# Patient Record
Sex: Female | Born: 1945
Health system: Southern US, Community
[De-identification: ages and names within clinical notes are randomized; demographics above are authoritative.]

## PROBLEM LIST (undated history)

## (undated) DIAGNOSIS — Z860100 Personal history of colon polyps, unspecified: Secondary | ICD-10-CM

## (undated) DIAGNOSIS — E119 Type 2 diabetes mellitus without complications: Secondary | ICD-10-CM

## (undated) DIAGNOSIS — M797 Fibromyalgia: Secondary | ICD-10-CM

## (undated) DIAGNOSIS — M549 Dorsalgia, unspecified: Secondary | ICD-10-CM

## (undated) DIAGNOSIS — M1990 Unspecified osteoarthritis, unspecified site: Secondary | ICD-10-CM

## (undated) DIAGNOSIS — K219 Gastro-esophageal reflux disease without esophagitis: Secondary | ICD-10-CM

## (undated) DIAGNOSIS — J302 Other seasonal allergic rhinitis: Secondary | ICD-10-CM

## (undated) DIAGNOSIS — G8929 Other chronic pain: Secondary | ICD-10-CM

## (undated) DIAGNOSIS — D259 Leiomyoma of uterus, unspecified: Secondary | ICD-10-CM

## (undated) DIAGNOSIS — I341 Nonrheumatic mitral (valve) prolapse: Secondary | ICD-10-CM

## (undated) DIAGNOSIS — F40240 Claustrophobia: Secondary | ICD-10-CM

## (undated) DIAGNOSIS — Z9889 Other specified postprocedural states: Secondary | ICD-10-CM

## (undated) DIAGNOSIS — K648 Other hemorrhoids: Secondary | ICD-10-CM

## (undated) DIAGNOSIS — H269 Unspecified cataract: Secondary | ICD-10-CM

## (undated) DIAGNOSIS — F419 Anxiety disorder, unspecified: Secondary | ICD-10-CM

## (undated) DIAGNOSIS — G2581 Restless legs syndrome: Secondary | ICD-10-CM

## (undated) DIAGNOSIS — M2550 Pain in unspecified joint: Secondary | ICD-10-CM

## (undated) DIAGNOSIS — G709 Myoneural disorder, unspecified: Secondary | ICD-10-CM

## (undated) DIAGNOSIS — Z8709 Personal history of other diseases of the respiratory system: Secondary | ICD-10-CM

## (undated) DIAGNOSIS — R351 Nocturia: Secondary | ICD-10-CM

## (undated) DIAGNOSIS — T7840XA Allergy, unspecified, initial encounter: Secondary | ICD-10-CM

## (undated) DIAGNOSIS — N189 Chronic kidney disease, unspecified: Secondary | ICD-10-CM

## (undated) DIAGNOSIS — I10 Essential (primary) hypertension: Secondary | ICD-10-CM

## (undated) DIAGNOSIS — J189 Pneumonia, unspecified organism: Secondary | ICD-10-CM

## (undated) DIAGNOSIS — R350 Frequency of micturition: Secondary | ICD-10-CM

## (undated) DIAGNOSIS — F32A Depression, unspecified: Secondary | ICD-10-CM

## (undated) DIAGNOSIS — K644 Residual hemorrhoidal skin tags: Secondary | ICD-10-CM

## (undated) DIAGNOSIS — R112 Nausea with vomiting, unspecified: Secondary | ICD-10-CM

## (undated) DIAGNOSIS — Z8619 Personal history of other infectious and parasitic diseases: Secondary | ICD-10-CM

## (undated) DIAGNOSIS — I499 Cardiac arrhythmia, unspecified: Secondary | ICD-10-CM

## (undated) HISTORY — PX: HEMORRHOID SURGERY: SHX153

## (undated) HISTORY — DX: Leiomyoma of uterus, unspecified: D25.9

## (undated) HISTORY — DX: Depression, unspecified: F32.A

## (undated) HISTORY — DX: Residual hemorrhoidal skin tags: K64.4

## (undated) HISTORY — DX: Type 2 diabetes mellitus without complications: E11.9

## (undated) HISTORY — DX: Personal history of colonic polyps: Z86.010

## (undated) HISTORY — DX: Essential (primary) hypertension: I10

## (undated) HISTORY — PX: TUBAL LIGATION: SHX77

## (undated) HISTORY — DX: Gastro-esophageal reflux disease without esophagitis: K21.9

## (undated) HISTORY — PX: WRIST SURGERY: SHX841

## (undated) HISTORY — DX: Unspecified cataract: H26.9

## (undated) HISTORY — DX: Allergy, unspecified, initial encounter: T78.40XA

## (undated) HISTORY — DX: Restless legs syndrome: G25.81

## (undated) HISTORY — DX: Unspecified osteoarthritis, unspecified site: M19.90

## (undated) HISTORY — DX: Personal history of colon polyps, unspecified: Z86.0100

## (undated) HISTORY — DX: Nonrheumatic mitral (valve) prolapse: I34.1

## (undated) HISTORY — PX: LUMBAR EPIDURAL INJECTION: SHX1980

## (undated) HISTORY — DX: Residual hemorrhoidal skin tags: K64.8

## (undated) HISTORY — PX: TONSILLECTOMY: SUR1361

## (undated) HISTORY — PX: BUNIONECTOMY: SHX129

## (undated) HISTORY — PX: COLONOSCOPY: SHX174

---

## 1998-02-17 ENCOUNTER — Other Ambulatory Visit: Admission: RE | Admit: 1998-02-17 | Discharge: 1998-02-17 | Payer: Self-pay | Admitting: Obstetrics and Gynecology

## 1999-08-04 ENCOUNTER — Encounter: Admission: RE | Admit: 1999-08-04 | Discharge: 1999-08-04 | Payer: Self-pay | Admitting: Obstetrics and Gynecology

## 1999-08-04 ENCOUNTER — Encounter: Payer: Self-pay | Admitting: Obstetrics and Gynecology

## 2000-04-20 ENCOUNTER — Encounter: Admission: RE | Admit: 2000-04-20 | Discharge: 2000-04-20 | Payer: Self-pay | Admitting: Family Medicine

## 2000-04-20 ENCOUNTER — Encounter: Payer: Self-pay | Admitting: Family Medicine

## 2000-08-23 ENCOUNTER — Encounter (INDEPENDENT_AMBULATORY_CARE_PROVIDER_SITE_OTHER): Payer: Self-pay | Admitting: Specialist

## 2000-08-23 ENCOUNTER — Ambulatory Visit (HOSPITAL_COMMUNITY): Admission: RE | Admit: 2000-08-23 | Discharge: 2000-08-23 | Payer: Self-pay | Admitting: Obstetrics and Gynecology

## 2000-08-23 HISTORY — PX: HYSTEROSCOPY WITH D & C: SHX1775

## 2000-11-07 ENCOUNTER — Encounter: Payer: Self-pay | Admitting: Obstetrics and Gynecology

## 2000-11-07 ENCOUNTER — Encounter: Admission: RE | Admit: 2000-11-07 | Discharge: 2000-11-07 | Payer: Self-pay | Admitting: Obstetrics and Gynecology

## 2001-04-24 ENCOUNTER — Encounter: Admission: RE | Admit: 2001-04-24 | Discharge: 2001-04-24 | Payer: Self-pay | Admitting: Orthopedic Surgery

## 2001-04-24 ENCOUNTER — Encounter: Payer: Self-pay | Admitting: Orthopedic Surgery

## 2001-06-14 ENCOUNTER — Ambulatory Visit (HOSPITAL_COMMUNITY): Admission: RE | Admit: 2001-06-14 | Discharge: 2001-06-14 | Payer: Self-pay | Admitting: Family Medicine

## 2001-10-05 ENCOUNTER — Other Ambulatory Visit: Admission: RE | Admit: 2001-10-05 | Discharge: 2001-10-05 | Payer: Self-pay | Admitting: Obstetrics and Gynecology

## 2002-07-08 ENCOUNTER — Other Ambulatory Visit: Admission: RE | Admit: 2002-07-08 | Discharge: 2002-07-08 | Payer: Self-pay | Admitting: Obstetrics and Gynecology

## 2002-12-20 ENCOUNTER — Encounter: Payer: Self-pay | Admitting: Neurosurgery

## 2002-12-20 ENCOUNTER — Ambulatory Visit (HOSPITAL_COMMUNITY): Admission: RE | Admit: 2002-12-20 | Discharge: 2002-12-20 | Payer: Self-pay | Admitting: Neurosurgery

## 2003-01-27 ENCOUNTER — Other Ambulatory Visit: Admission: RE | Admit: 2003-01-27 | Discharge: 2003-01-27 | Payer: Self-pay | Admitting: Obstetrics and Gynecology

## 2004-02-13 ENCOUNTER — Encounter: Admission: RE | Admit: 2004-02-13 | Discharge: 2004-02-13 | Payer: Self-pay | Admitting: Obstetrics and Gynecology

## 2004-06-18 ENCOUNTER — Ambulatory Visit: Payer: Self-pay | Admitting: Internal Medicine

## 2004-07-12 ENCOUNTER — Ambulatory Visit: Payer: Self-pay | Admitting: Internal Medicine

## 2004-07-12 HISTORY — PX: COLONOSCOPY: SHX5424

## 2005-03-11 ENCOUNTER — Emergency Department (HOSPITAL_COMMUNITY): Admission: EM | Admit: 2005-03-11 | Discharge: 2005-03-11 | Payer: Self-pay | Admitting: Emergency Medicine

## 2005-03-16 ENCOUNTER — Ambulatory Visit (HOSPITAL_COMMUNITY): Admission: RE | Admit: 2005-03-16 | Discharge: 2005-03-16 | Payer: Self-pay | Admitting: Neurosurgery

## 2005-03-16 HISTORY — PX: LUMBAR DISC SURGERY: SHX700

## 2005-05-26 ENCOUNTER — Ambulatory Visit (HOSPITAL_COMMUNITY): Admission: RE | Admit: 2005-05-26 | Discharge: 2005-05-26 | Payer: Self-pay | Admitting: Obstetrics and Gynecology

## 2005-08-24 ENCOUNTER — Encounter: Admission: RE | Admit: 2005-08-24 | Discharge: 2005-08-24 | Payer: Self-pay | Admitting: Neurosurgery

## 2006-05-29 ENCOUNTER — Ambulatory Visit (HOSPITAL_COMMUNITY): Admission: RE | Admit: 2006-05-29 | Discharge: 2006-05-29 | Payer: Self-pay | Admitting: Obstetrics and Gynecology

## 2007-05-31 ENCOUNTER — Ambulatory Visit (HOSPITAL_COMMUNITY): Admission: RE | Admit: 2007-05-31 | Discharge: 2007-05-31 | Payer: Self-pay | Admitting: Obstetrics and Gynecology

## 2008-06-02 ENCOUNTER — Ambulatory Visit (HOSPITAL_COMMUNITY): Admission: RE | Admit: 2008-06-02 | Discharge: 2008-06-02 | Payer: Self-pay | Admitting: Obstetrics and Gynecology

## 2008-06-13 ENCOUNTER — Encounter: Admission: RE | Admit: 2008-06-13 | Discharge: 2008-06-13 | Payer: Self-pay | Admitting: Obstetrics and Gynecology

## 2008-10-10 DIAGNOSIS — I341 Nonrheumatic mitral (valve) prolapse: Secondary | ICD-10-CM | POA: Insufficient documentation

## 2009-06-12 ENCOUNTER — Ambulatory Visit (HOSPITAL_COMMUNITY): Admission: RE | Admit: 2009-06-12 | Discharge: 2009-06-12 | Payer: Self-pay | Admitting: Obstetrics and Gynecology

## 2009-06-22 ENCOUNTER — Emergency Department (HOSPITAL_COMMUNITY): Admission: EM | Admit: 2009-06-22 | Discharge: 2009-06-22 | Payer: Self-pay | Admitting: Emergency Medicine

## 2009-06-29 ENCOUNTER — Encounter: Admission: RE | Admit: 2009-06-29 | Discharge: 2009-06-29 | Payer: Self-pay | Admitting: Family Medicine

## 2010-02-02 ENCOUNTER — Ambulatory Visit (HOSPITAL_BASED_OUTPATIENT_CLINIC_OR_DEPARTMENT_OTHER): Admission: RE | Admit: 2010-02-02 | Discharge: 2010-02-02 | Payer: Self-pay | Admitting: Orthopedic Surgery

## 2010-02-03 HISTORY — PX: PLANTAR FASCIA RELEASE: SHX2239

## 2010-06-14 ENCOUNTER — Ambulatory Visit (HOSPITAL_COMMUNITY): Admission: RE | Admit: 2010-06-14 | Discharge: 2010-06-14 | Payer: Self-pay | Admitting: Obstetrics and Gynecology

## 2010-08-19 DIAGNOSIS — J309 Allergic rhinitis, unspecified: Secondary | ICD-10-CM | POA: Insufficient documentation

## 2010-10-17 LAB — POCT I-STAT 4, (NA,K, GLUC, HGB,HCT)
HCT: 38 % (ref 36.0–46.0)
Hemoglobin: 12.9 g/dL (ref 12.0–15.0)

## 2010-11-03 LAB — COMPREHENSIVE METABOLIC PANEL
ALT: 25 U/L (ref 0–35)
AST: 26 U/L (ref 0–37)
Albumin: 3.9 g/dL (ref 3.5–5.2)
BUN: 20 mg/dL (ref 6–23)
Calcium: 9 mg/dL (ref 8.4–10.5)
Chloride: 102 mEq/L (ref 96–112)
Creatinine, Ser: 0.88 mg/dL (ref 0.4–1.2)
GFR calc Af Amer: >60 mL/min (ref >60)

## 2010-11-03 LAB — URINALYSIS, ROUTINE W REFLEX MICROSCOPIC
Hgb urine dipstick: NEGATIVE
Ketones, ur: NEGATIVE mg/dL
Nitrite: NEGATIVE

## 2010-11-03 LAB — URINE CULTURE: Colony Count: NO GROWTH

## 2010-11-03 LAB — URINE MICROSCOPIC-ADD ON

## 2010-11-03 LAB — DIFFERENTIAL
Eosinophils Absolute: 0.1 10*3/uL (ref 0.0–0.7)
Lymphocytes Relative: 30 % (ref 12–46)
Monocytes Absolute: 0.5 10*3/uL (ref 0.1–1.0)

## 2010-11-03 LAB — LIPASE, BLOOD: Lipase: 21 U/L (ref 11–59)

## 2010-11-03 LAB — CBC
HCT: 36.9 % (ref 36.0–46.0)
Platelets: 253 10*3/uL (ref 150–400)
RDW: 12.5 % (ref 11.5–15.5)

## 2010-11-08 DIAGNOSIS — I152 Hypertension secondary to endocrine disorders: Secondary | ICD-10-CM | POA: Insufficient documentation

## 2010-11-08 DIAGNOSIS — E1159 Type 2 diabetes mellitus with other circulatory complications: Secondary | ICD-10-CM | POA: Insufficient documentation

## 2010-12-17 NOTE — Op Note (Signed)
NAME:  [name], [name] NO.:  [id number]   MEDICAL RECORD NO.:  [id number]          PATIENT TYPE:  OIB   LOCATION:  2855                         FACILITY:  [facility]   PHYSICIAN:  Coletta Memos, M.D.     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 I have numbered it L4-5, thought there certainly could to be  some discrepancy as the last level maybe transitional and on plain films  there did not appear to be a true disk space, on MRI there is one.   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 I have numbered it L4-5, thought there certainly could to be  some discrepancy as the last level maybe transitional and on plain films  there did not appear to be a true disk space, on MRI there is one.   PROCEDURE:  Right L4- 5 semihemilaminectomy and diskectomy with  microdissection.   COMPLICATIONS:  None.   SURGEON:  Coletta Memos, M.D.   ASSISTANT:  Clydene Fake, M.D.   INDICATIONS:  Cooper Render is a 65 year old presenting with severe pain in the  right lower extremity which is getting worse over the last few weeks.  Epidural injections were of no help. I therefore recommended and she agreed  to undergo operative decompression.   OPERATIVE NOTE:  Mrs. [name] was brought to the operating room intubated and  placed under a general anesthetic without difficulty. She was rolled prone  onto a Wilson frame and all pressure points were properly padded. Back was  prepped and she was draped in sterile fashion and I had infiltrated 20 mL  0.5% lidocaine 1:200,000 strength epinephrine into the lumbar region. I  opened the skin with a #10 blade took this down to the thoracolumbar fascia.  I placed a double ended ganglion knife and this showed that I was at the  correct interlaminar space. Using that as a guide I then used a Kerrison and  removed some of the ligamentum flavum and then exposed the thecal sac,  taking off a small portion of the right L4 lamina on the right side. After  exposing the thecal sac, I was able retract that medially and encountered  what was a disk herniation overlying the disk space. I retracted the thecal  sac medially, brought the microscope in and with microdissection, removed  the disk material with Dr. Doreen Beam assistance. We fully decompressed the  nerve roots in the operative field. I inspected medially, laterally  rostrally and  caudally and felt that there were no free fragments, nor was any compression  left on the disk space. I then irrigated the wound. I then closed wound with  Dr. Doreen Beam assistance  in layered fashion. Vicryl sutures used to  reapproximate thoracolumbar fascia, subcutaneous tissue and then used  Dermabond for sterile dressing.           ______________________________  Coletta Memos, M.D.     KC/MEDQ  D:  03/16/2005  T:  03/16/2005  Job:  [id number]

## 2010-12-17 NOTE — Op Note (Signed)
[facility]  Patient:    [name], [name]                            MRN: [id number] Proc. Date: 08/23/00 Attending:  Lenoard Aden, M.D. [facility]   Operative Report  PREOPERATIVE DIAGNOSES:       1. Post menopausal bleeding.                               2. Questionable endometrial mass.                               3. Postoperative submucous fibroid.  PROCEDURES:                   1. Diagnostic hysteroscopy.                               2. Resectoscopic myomectomy.                               3. Dilation and curettage.  SURGEON:                      Lenoard Aden, M.D.  ANESTHESIA:                   General.  ESTIMATED BLOOD LOSS:         About 50 cc.  FLUID DEFICIT:                150 cc.  COMPLICATIONS:                None.  DRAINS:                       None.  SPECIMEN:                     Endometrial curettings and resection of questionable fibroid fragments to pathology.  COUNTS:                       Correct.  DISPOSITION:                  Patient to recovery in good condition.  DESCRIPTION OF PROCEDURE:     Being apprised of the risks of anesthesia, infection, bleeding, damage to abdominal organs with the need for repair and possible uterine perforation with injury to bladder or bladder with the need for repair, the patient was brought to the operating room, where she was administered general anesthesia without complications, prepped and draped in the usual sterile fashion, and catheterized until the bladder was empty. Examination under anesthesia revealed a bulky, anteflexed uterus and no adnexal masses.  At this time, diluted Pitressin solution was placed at 3 and 9 oclock, approximately 10 cc at the cervicovaginal junction.  A single tooth tenaculum was used to grasp the anterior lip of the cervix.  The uterus sounded to about 11 cm, dilated up to a #31 Pratt dilator and the diagnostic hysteroscope  was placed.  Visualization revealed a large, protrusive mass which was smooth walled and rubbery in its  consistency, projecting from the anterolateral and along the posterior wall on the patients right.  The right tubal ostium was easily visualized.  The left tubal ostium was not able to be seen.  Using a double loop right angle resectoscope, this mass was resected down to the level of the endometrium, taking approximately an hour to do so due to the large size of this mass.  Good hemostasis was achieved.  Fluid deficit was about 150 cc.  D&C revealed minimal tissue and curettings, which were sent with the specimen.  At the finish of this, there was a large, poorly ______ base of this mass along the posterolateral and anterior wall, seeing that the margins of this mass were note well defined along its attachment to the uterine wall.  The right tubal ostium was still unable to be visualized. However, the bulk of this mass was easily resected and sent to pathology for pathological confirmation.  At this time, the procedure was terminated.  The patient tolerated the procedure well and was transferred to recovery in good condition. DD:  08/23/00 TD:  08/23/00 Job: [id number]

## 2010-12-17 NOTE — H&P (Signed)
[facility] [place] [facility]  PatientBUFFEY, [name]                             MRN: [id number] Adm. Date:  08/23/00 Dictator:   Lenoard Aden, M.D. [facility]   History and Physical  PREOPERATIVE DIAGNOSIS:       Postmenopausal bleeding.  HISTORY OF PRESENT ILLNESS:   The patient is a 65 year old white female, G3, P3, with persistent bleeding on Prempro and questionable structural abnormality on ultrasound for definitive management.  PAST MEDICAL HISTORY:         Remarkable for mitral valve prolapse requiring no medications and hypertension.  MEDICATIONS:                  Prempro, HCTZ, Toprol and Prevacid.  ALLERGIES:                    She has allergies to CODEINE and TETRACYCLINE.  SOCIAL HISTORY:               She is a nonsmoker, nondrinker.  She denies domestic or physical violence.  FAMILY HISTORY:               Otherwise noncontributory.  OBSTETRIC HISTORY:            Remarkable for three uncomplicated vaginal deliveries.  PHYSICAL EXAMINATION:  GENERAL:                      She is a well-developed, well-nourished white female in no apparent distress.  HEENT:                        Normal.  LUNGS:                        Clear.  HEART:                        Regular rate and rhythm with a grade 1/6 to 2/6 ejection murmur.  ABDOMEN:                      Obese and nontender.  PELVIC EXAM:                  Reveals a boggy, anteflexed uterus and no adnexal masses.  RECTAL EXAM:                  Within normal limits.  EXTREMITIES:                  Revealed no cords.  NEUROLOGIC EXAM:              Nonfocal.  IMPRESSION:                   Postmenopausal bleeding with questionable structural lesion on ultrasound.  PLAN:                         Proceed with diagnostic hysteroscopy with possible rectoscope.  The risks of anesthesia, infection, bleeding, injury to abdominal organs with the need for repair is discussed.  The  risks of uterine perforation with possible injury to bowel and bladder are noted and the possible need for repair.  The patient is apprised of  these risks and she desires to proceed with the procedure. DD:  08/23/00 TD:  08/23/00 Job: [id number]

## 2011-05-25 ENCOUNTER — Encounter: Payer: Self-pay | Admitting: Internal Medicine

## 2011-05-25 ENCOUNTER — Ambulatory Visit (INDEPENDENT_AMBULATORY_CARE_PROVIDER_SITE_OTHER): Payer: 59 | Admitting: Internal Medicine

## 2011-05-25 VITALS — BP 132/68 | HR 80 | Ht 65.0 in | Wt 199.0 lb

## 2011-05-25 DIAGNOSIS — K648 Other hemorrhoids: Secondary | ICD-10-CM

## 2011-05-25 DIAGNOSIS — E669 Obesity, unspecified: Secondary | ICD-10-CM

## 2011-05-25 DIAGNOSIS — R198 Other specified symptoms and signs involving the digestive system and abdomen: Secondary | ICD-10-CM

## 2011-05-25 DIAGNOSIS — K219 Gastro-esophageal reflux disease without esophagitis: Secondary | ICD-10-CM

## 2011-05-25 DIAGNOSIS — R194 Change in bowel habit: Secondary | ICD-10-CM

## 2011-05-25 MED ORDER — OMEPRAZOLE MAGNESIUM 20 MG PO TBEC: 20.0000 mg | DELAYED_RELEASE_TABLET | Freq: Every day | ORAL | Status: DC

## 2011-05-25 MED ORDER — HYDROCORTISONE 2.5 % RE CREA: TOPICAL_CREAM | Freq: Two times a day (BID) | RECTAL | Status: AC

## 2011-05-25 NOTE — Assessment & Plan Note (Signed)
BMI is 33. She understands and accepts this. We discussed simple weight loss measures like smaller plates, reduced portions, not snacking or eating when there is no hunger. She has a goal to lose 10 pounds in 6 months.

## 2011-05-25 NOTE — Patient Instructions (Addendum)
If problems don't resolve and respond to the medication changes you should return to see Dr. Leone Payor. Your prescription(s) has(have) been sent to your pharmacy for you to pick up ([facility]ol-AC). Hemorrhoid handout given for you to read. Prevacid has been stopped and changed to Prilosec OTC.Marland Kitchen

## 2011-05-25 NOTE — Progress Notes (Signed)
  Subjective:    Patient ID: [name], female    DOB: September 30, 1945, 65 y.o.   MRN: [id number]  HPI This 65 year old woman known from prior colonoscopy in 2005, has complaints of painful hemorrhoids. She has noticed streaks of white red blood on the toilet paper when she returns from walking. There is also white red blood on toilet paper with defecation at times. She is also noticed in the past several months that she has more frequent and urgent bowel movements in the morning, and has had some episodes of fecal incontinence. Small amount into the underwear. She started taking amlodipine around this time but believe she actually had the bowel habit changes before the amlodipine was begun. She did start Prevacid about the same time, having switched from Prilosec.   She describes frequent regurgitation and heartburn if she does not take a PPI. She does not have any osteoporosis or osteopenia that we know of at this time. No dysphagia reported.  Past Medical History  Diagnosis Date  . HTN (hypertension)   . Mitral valve prolapse   . Uterine fibroid   . Diverticulosis   . Internal and external hemorrhoids without complication   . GERD (gastroesophageal reflux disease)   . Hx of colonic polyps 1975   Past Surgical History  Procedure Date  . Hysteroscopy w/d&c 08/23/2000    and resectoscopic myomectomy  . Lumbar disc surgery 03/16/2005  . Plantar fascia release 02/03/2010    and torn peroneus brevis tendon  . Hemorrhoid surgery   . Tonsillectomy   . Wrist surgery     left, rmoval of cyst  . Tubal ligation   . Colonoscopy 07/12/2004    diverticulosis, internal and external hemorrhoids    reports that she has quit smoking. She has never used smokeless tobacco. She reports that she does not drink alcohol or use illicit drugs. family history includes Pancreatic cancer in her father.  There is no history of Colon cancer. Allergies  Allergen Reactions  . Codeine   . Tetracyclines & Related          Review of Systems Planes of arthritis and back pain all of the root of systems negative    Objective:   Physical Exam General: Obese, Well-developed, well-nourished and in no acute distress Vitals: Reviewed and listed above Eyes:anicteric. Mouth and posterior pharynx: normal.  Neck: supple w/o thyromegaly or mass.  Lungs: clear. Heart: S1S2, no rubs, murmurs, gallops. Abdomen: soft, non-tender, no hepatosplenomegaly, hernia, or mass and BS+.  Rectal:  Female staff present - normal anoderm, resting tone. No mass. Soft brown stool. Nontender and no rectocele.   ANOSCOPY:  Swollen and erythematous internal hemorrhoids  Lymphatics: no cervical, [place]  nodes. Extremities:  no edema Skin no rash. Neuro: nonfocal. A&O x 3.  Psych: appropriate mood and  affect.        Assessment & Plan:

## 2011-05-25 NOTE — Assessment & Plan Note (Addendum)
It sounds like she does need a PPI to control things. I've asked her to lose 10 pounds in 6-12 months, more if possible. She drinks minimal caffeine. She will continue on daily PPI but will switched back to Prilosec since that did not cause side effects as suspected with Prevacid, i.e. the loose frequent stools. The concern about osteopenia or osteoporosis is appropriate legitimate, but it sounds like the risks of not treating her reflux outweigh the benefits. Should she lose weight she may require lower dose therapy. She has tried H2 blockers and they have not helped. About warning signs or other problems there is no reason to perform endoscopy.

## 2011-05-25 NOTE — Assessment & Plan Note (Signed)
The pattern of bleeding, the finding of hemorrhoids on anoscopy supports this diagnosis. I wonder if her more frequent loose stools are triggering this. Plan is to change back to Prilosec for acid reflux and she was not having frequent loose morning stools on this, treatment with ProctoCream-HC 2.5% to if this fails to resolve things she is to call back. Colonoscopy in 2005, routine screening colonoscopy not yet due until 2015.

## 2011-06-06 ENCOUNTER — Other Ambulatory Visit (HOSPITAL_COMMUNITY): Payer: Self-pay | Admitting: Obstetrics and Gynecology

## 2011-06-06 DIAGNOSIS — Z1231 Encounter for screening mammogram for malignant neoplasm of breast: Secondary | ICD-10-CM

## 2011-07-01 ENCOUNTER — Ambulatory Visit (HOSPITAL_COMMUNITY)
Admission: RE | Admit: 2011-07-01 | Discharge: 2011-07-01 | Disposition: A | Discharge status: Home / Self Care | Payer: Medicare Other | Source: Ambulatory Visit | Attending: Obstetrics and Gynecology | Admitting: Obstetrics and Gynecology

## 2011-07-01 DIAGNOSIS — Z1231 Encounter for screening mammogram for malignant neoplasm of breast: Secondary | ICD-10-CM | POA: Insufficient documentation

## 2012-04-23 ENCOUNTER — Other Ambulatory Visit: Payer: Self-pay | Admitting: Orthopedic Surgery

## 2012-04-23 DIAGNOSIS — M545 Low back pain, unspecified: Secondary | ICD-10-CM

## 2012-04-27 ENCOUNTER — Ambulatory Visit
Admission: RE | Admit: 2012-04-27 | Discharge: 2012-04-27 | Disposition: A | Discharge status: Home / Self Care | Payer: Medicare Other | Source: Ambulatory Visit | Attending: Orthopedic Surgery | Admitting: Orthopedic Surgery

## 2012-04-27 DIAGNOSIS — M545 Low back pain, unspecified: Secondary | ICD-10-CM

## 2012-04-27 MED ORDER — GADOBENATE DIMEGLUMINE 529 MG/ML IV SOLN: 19.0000 mL | Freq: Once | INTRAVENOUS | Status: AC | PRN

## 2012-06-13 ENCOUNTER — Other Ambulatory Visit (HOSPITAL_COMMUNITY): Payer: Self-pay | Admitting: Family Medicine

## 2012-06-13 DIAGNOSIS — Z1231 Encounter for screening mammogram for malignant neoplasm of breast: Secondary | ICD-10-CM

## 2012-07-05 ENCOUNTER — Ambulatory Visit (HOSPITAL_COMMUNITY): Payer: Medicare Other

## 2012-07-12 ENCOUNTER — Ambulatory Visit (HOSPITAL_COMMUNITY)
Admission: RE | Admit: 2012-07-12 | Discharge: 2012-07-12 | Disposition: A | Discharge status: Home / Self Care | Payer: Medicare Other | Source: Ambulatory Visit | Attending: Family Medicine | Admitting: Family Medicine

## 2012-07-12 DIAGNOSIS — Z1231 Encounter for screening mammogram for malignant neoplasm of breast: Secondary | ICD-10-CM | POA: Insufficient documentation

## 2012-09-12 DIAGNOSIS — M48061 Spinal stenosis, lumbar region without neurogenic claudication: Secondary | ICD-10-CM | POA: Insufficient documentation

## 2013-03-15 ENCOUNTER — Other Ambulatory Visit: Payer: Self-pay | Admitting: Family Medicine

## 2013-03-15 DIAGNOSIS — E2839 Other primary ovarian failure: Secondary | ICD-10-CM

## 2013-04-04 ENCOUNTER — Other Ambulatory Visit: Payer: Medicare Other

## 2013-04-09 DIAGNOSIS — E119 Type 2 diabetes mellitus without complications: Secondary | ICD-10-CM | POA: Insufficient documentation

## 2013-04-09 DIAGNOSIS — E1142 Type 2 diabetes mellitus with diabetic polyneuropathy: Secondary | ICD-10-CM | POA: Insufficient documentation

## 2013-04-11 ENCOUNTER — Ambulatory Visit
Admission: RE | Admit: 2013-04-11 | Discharge: 2013-04-11 | Disposition: A | Discharge status: Home / Self Care | Payer: Medicare Other | Source: Ambulatory Visit | Attending: Family Medicine | Admitting: Family Medicine

## 2013-04-11 DIAGNOSIS — E2839 Other primary ovarian failure: Secondary | ICD-10-CM

## 2013-04-15 ENCOUNTER — Other Ambulatory Visit: Payer: Medicare Other

## 2013-04-26 ENCOUNTER — Other Ambulatory Visit: Payer: Medicare Other

## 2013-05-01 ENCOUNTER — Ambulatory Visit
Admission: RE | Admit: 2013-05-01 | Discharge: 2013-05-01 | Disposition: A | Discharge status: Home / Self Care | Payer: Medicare Other | Source: Ambulatory Visit | Attending: Family Medicine | Admitting: Family Medicine

## 2013-06-10 ENCOUNTER — Other Ambulatory Visit (HOSPITAL_COMMUNITY): Payer: Self-pay | Admitting: Family Medicine

## 2013-06-10 DIAGNOSIS — Z1231 Encounter for screening mammogram for malignant neoplasm of breast: Secondary | ICD-10-CM

## 2013-07-15 ENCOUNTER — Ambulatory Visit (HOSPITAL_COMMUNITY)
Admission: RE | Admit: 2013-07-15 | Discharge: 2013-07-15 | Disposition: A | Discharge status: Home / Self Care | Payer: Medicare Other | Source: Ambulatory Visit | Attending: Family Medicine | Admitting: Family Medicine

## 2013-07-15 DIAGNOSIS — Z1231 Encounter for screening mammogram for malignant neoplasm of breast: Secondary | ICD-10-CM | POA: Insufficient documentation

## 2014-05-13 DIAGNOSIS — G2581 Restless legs syndrome: Secondary | ICD-10-CM | POA: Insufficient documentation

## 2014-06-06 ENCOUNTER — Encounter: Payer: Self-pay | Admitting: Internal Medicine

## 2014-06-19 ENCOUNTER — Other Ambulatory Visit (HOSPITAL_COMMUNITY): Payer: Self-pay | Admitting: Family Medicine

## 2014-06-19 DIAGNOSIS — Z1231 Encounter for screening mammogram for malignant neoplasm of breast: Secondary | ICD-10-CM

## 2014-07-14 ENCOUNTER — Encounter: Payer: Self-pay | Admitting: Internal Medicine

## 2014-07-16 ENCOUNTER — Ambulatory Visit (HOSPITAL_COMMUNITY)
Admission: RE | Admit: 2014-07-16 | Discharge: 2014-07-16 | Disposition: A | Discharge status: Home / Self Care | Payer: Medicare Other | Source: Ambulatory Visit | Attending: Family Medicine | Admitting: Family Medicine

## 2014-07-16 DIAGNOSIS — Z1231 Encounter for screening mammogram for malignant neoplasm of breast: Secondary | ICD-10-CM | POA: Diagnosis not present

## 2014-07-30 ENCOUNTER — Ambulatory Visit (AMBULATORY_SURGERY_CENTER): Payer: Self-pay | Admitting: *Deleted

## 2014-07-30 VITALS — Ht 65.0 in | Wt 199.0 lb

## 2014-07-30 DIAGNOSIS — Z1211 Encounter for screening for malignant neoplasm of colon: Secondary | ICD-10-CM

## 2014-07-30 NOTE — Progress Notes (Signed)
No egg or soy allergy. No anesthesia problems.  No home O2.  No diet meds.  

## 2014-08-14 ENCOUNTER — Encounter: Payer: Self-pay | Admitting: Internal Medicine

## 2014-08-14 ENCOUNTER — Ambulatory Visit (AMBULATORY_SURGERY_CENTER): Payer: Medicare Other | Admitting: Internal Medicine

## 2014-08-14 VITALS — BP 137/57 | HR 57 | Temp 96.4°F | Resp 19 | Ht 65.0 in | Wt 199.0 lb

## 2014-08-14 DIAGNOSIS — K5730 Diverticulosis of large intestine without perforation or abscess without bleeding: Secondary | ICD-10-CM

## 2014-08-14 DIAGNOSIS — Z1211 Encounter for screening for malignant neoplasm of colon: Secondary | ICD-10-CM

## 2014-08-14 LAB — GLUCOSE, CAPILLARY
GLUCOSE-CAPILLARY: 132 mg/dL — AB (ref 70–99)
Glucose-Capillary: 127 mg/dL — ABNORMAL HIGH (ref 70–99)

## 2014-08-14 MED ORDER — SODIUM CHLORIDE 0.9 % IV SOLN: 500.0000 mL | INTRAVENOUS | Status: DC

## 2014-08-14 NOTE — Progress Notes (Signed)
A/ox3 pleased with MAC, report to [facility]

## 2014-08-14 NOTE — Op Note (Signed)
[place]  [address] [place], [address]   COLONOSCOPY PROCEDURE REPORT  PATIENT: [name], [name]  MR#: [id number] BIRTHDATE: 1946/05/06 , 81  yrs. old GENDER: female ENDOSCOPIST: Gatha Mayer, MD, [facility] PROCEDURE DATE:  08/14/2014 PROCEDURE:   Colonoscopy, screening First Screening Colonoscopy - Avg.  risk and is 50 yrs.  old or older - No.  Prior Negative Screening - Now for repeat screening. 10 or more years since last screening  History of Adenoma - Now for follow-up colonoscopy & has been > or = to 3 yrs.  N/A  Polyps Removed Today? No.  Polyps Removed Today? No.  Recommend repeat exam, <10 yrs? Polyps Removed Today? No.  Recommend repeat exam, <10 yrs? No. ASA CLASS:   Class II INDICATIONS:average risk for colorectal cancer. MEDICATIONS: Propofol 200 mg IV and Monitored anesthesia care  DESCRIPTION OF PROCEDURE:   After the risks benefits and alternatives of the procedure were thoroughly explained, informed consent was obtained.  The digital rectal exam revealed no abnormalities of the rectum.   The [id number]  endoscope was introduced through the anus and advanced to the cecum, which was identified by both the appendix and ileocecal valve. No adverse events experienced.   The quality of the prep was excellent, using MiraLax  The instrument was then slowly withdrawn as the colon was fully examined.      COLON FINDINGS: 1) Sigmoid diverticulosis 2) Otherwise normal colonoscopy to cecum with excellent prep. Retroflexed views revealed no abnormalities. The time to cecum=5 minutes 23 seconds.  Withdrawal time=8 minutes 37 seconds.  The scope was withdrawn and the procedure completed. COMPLICATIONS: There were no immediate complications.  ENDOSCOPIC IMPRESSION: 1) Sigmoid diverticulosis 2) Otherwise normal colonoscopy to cecum with excellent prep  RECOMMENDATIONS: Follow-up GI as needed - may consider repeat screening in 10 yrs at 2 -  through PCP (no recall)  eSigned:  Gatha Mayer, MD, [facility] 08/14/2014 8:32 AM   cc: Billey Chang, MD and The Patient

## 2014-08-14 NOTE — Patient Instructions (Addendum)
No polyps or cancer seen. You do have a condition called diverticulosis - common and not usually a problem. Please read the handout provided.   You may consider repeating a colonoscopy or other screening in 10 years based upon your health - you would be 78 and that is in the range where it is not thought to be as beneficial to screen due to life expectancy.  I appreciate the opportunity to care for you. Gatha Mayer, MD, FACG  YOU HAD AN ENDOSCOPIC PROCEDURE TODAY AT [facility]: Refer to the procedure report that was given to you for any specific questions about what was found during the examination.  If the procedure report does not answer your questions, please call your gastroenterologist to clarify.  If you requested that your care partner not be given the details of your procedure findings, then the procedure report has been included in a sealed envelope for you to review at your convenience later.  YOU SHOULD EXPECT: Some feelings of bloating in the abdomen. Passage of more gas than usual.  Walking can help get rid of the air that was put into your GI tract during the procedure and reduce the bloating. If you had a lower endoscopy (such as a colonoscopy or flexible sigmoidoscopy) you may notice spotting of blood in your stool or on the toilet paper. If you underwent a bowel prep for your procedure, then you may not have a normal bowel movement for a few days.  DIET: Your first meal following the procedure should be a light meal and then it is ok to progress to your normal diet.  A half-sandwich or bowl of soup is an example of a good first meal.  Heavy or fried foods are harder to digest and may make you feel nauseous or bloated.  Likewise meals heavy in dairy and vegetables can cause extra gas to form and this can also increase the bloating.  Drink plenty of fluids but you should avoid alcoholic beverages for 24 hours.  ACTIVITY: Your care partner should take you  home directly after the procedure.  You should plan to take it easy, moving slowly for the rest of the day.  You can resume normal activity the day after the procedure however you should NOT DRIVE or use heavy machinery for 24 hours (because of the sedation medicines used during the test).    SYMPTOMS TO REPORT IMMEDIATELY: A gastroenterologist can be reached at any hour.  During normal business hours, 8:30 AM to 5:00 PM Monday through Friday, call [phone].  After hours and on weekends, please call the GI answering service at [phone] who will take a message and have the physician on call contact you.   Following lower endoscopy (colonoscopy or flexible sigmoidoscopy):  Excessive amounts of blood in the stool  Significant tenderness or worsening of abdominal pains  Swelling of the abdomen that is new, acute  Fever of 100F or higher  FOLLOW UP: If any biopsies were taken you will be contacted by phone or by letter within the next 1-3 weeks.  Call your gastroenterologist if you have not heard about the biopsies in 3 weeks.  Our staff will call the home number listed on your records the next business day following your procedure to check on you and address any questions or concerns that you may have at that time regarding the information given to you following your procedure. This is a courtesy call and so if there  is no answer at the home number and we have not heard from you through the emergency physician on call, we will assume that you have returned to your regular daily activities without incident.  SIGNATURES/CONFIDENTIALITY: You and/or your care partner have signed paperwork which will be entered into your electronic medical record.  These signatures attest to the fact that that the information above on your After Visit Summary has been reviewed and is understood.  Full responsibility of the confidentiality of this discharge information lies with you and/or your care-partner.

## 2014-08-15 ENCOUNTER — Telehealth: Payer: Self-pay

## 2014-08-15 NOTE — Telephone Encounter (Signed)
  Follow up Call-  Call back number 08/14/2014  Post procedure Call Back phone  # [phone]  Permission to leave phone message Yes     Patient questions:  Do you have a fever, pain , or abdominal swelling? No. Pain Score  0 *  Have you tolerated food without any problems? Yes.    Have you been able to return to your normal activities? Yes.    Do you have any questions about your discharge instructions: Diet   No. Medications  No. Follow up visit  No.  Do you have questions or concerns about your Care? No.  Actions: * If pain score is 4 or above: No action needed, pain <4.

## 2014-08-15 NOTE — Addendum Note (Signed)
Addended by: Steva Ready on: 08/15/2014 07:10 AM   Modules accepted: Level of Service

## 2015-06-04 DIAGNOSIS — M5136 Other intervertebral disc degeneration, lumbar region: Secondary | ICD-10-CM | POA: Insufficient documentation

## 2015-06-04 DIAGNOSIS — Z9889 Other specified postprocedural states: Secondary | ICD-10-CM | POA: Insufficient documentation

## 2015-06-09 ENCOUNTER — Other Ambulatory Visit: Payer: Self-pay

## 2015-06-09 DIAGNOSIS — Z1231 Encounter for screening mammogram for malignant neoplasm of breast: Secondary | ICD-10-CM

## 2015-07-21 ENCOUNTER — Ambulatory Visit
Admission: RE | Admit: 2015-07-21 | Discharge: 2015-07-21 | Disposition: A | Discharge status: Home / Self Care | Payer: Medicare Other | Source: Ambulatory Visit

## 2015-07-21 DIAGNOSIS — Z1231 Encounter for screening mammogram for malignant neoplasm of breast: Secondary | ICD-10-CM

## 2015-09-18 ENCOUNTER — Encounter (HOSPITAL_COMMUNITY): Payer: Self-pay

## 2015-09-18 NOTE — Pre-Procedure Instructions (Signed)
[name]  09/18/2015      [facility] - [place], [place] - [address] [place]  [address] Phone: [phone] Fax: [phone]    Your procedure is scheduled on Mon, Feb 27 @ 3:15 PM  Report to [facility] Admitting at [employer] PM  Call this number if you have problems the morning of surgery:  [phone]   Remember:  Do not eat food or drink liquids after midnight.  Take these medicines the morning of surgery with A SIP OF WATER Amlodipine(Norvasc),Eye Drops,Claritin(Loratadine),and Omeprazole(Prilosec)               Stop taking your Aspirin along with any Vitamins or Herbal Medications. No Goody's,BC's,Aleve,Advil,Motrin,or Fish Oil.    Do not wear jewelry, make-up or nail polish.  Do not wear lotions, powders, or perfumes.    Do not shave 48 hours prior to surgery.    Do not bring valuables to the hospital.  [facility] is not responsible for any belongings or valuables.  Contacts, dentures or bridgework may not be worn into surgery.  Leave your suitcase in the car.  After surgery it may be brought to your room.  For patients admitted to the hospital, discharge time will be determined by your treatment team.  Patients discharged the day of surgery will not be allowed to drive home.    Special instructions:  [name] - Preparing for Surgery  Before surgery, you can play an important role.  Because skin is not sterile, your skin needs to be as free of germs as possible.  You can reduce the number of germs on you skin by washing with CHG (chlorahexidine gluconate) soap before surgery.  CHG is an antiseptic cleaner which kills germs and bonds with the skin to continue killing germs even after washing.  Please DO NOT use if you have an allergy to CHG or antibacterial soaps.  If your skin becomes reddened/irritated stop using the CHG and inform your nurse when you arrive at Short Stay.  Do not shave (including legs and  underarms) for at least 48 hours prior to the first CHG shower.  You may shave your face.  Please follow these instructions carefully:   1.  Shower with CHG Soap the night before surgery and the                                morning of Surgery.  2.  If you choose to wash your hair, wash your hair first as usual with your       normal shampoo.  3.  After you shampoo, rinse your hair and body thoroughly to remove the                      Shampoo.  4.  Use CHG as you would any other liquid soap.  You can apply chg directly       to the skin and wash gently with scrungie or a clean washcloth.  5.  Apply the CHG Soap to your body ONLY FROM THE NECK DOWN.        Do not use on open wounds or open sores.  Avoid contact with your eyes,       ears, mouth and genitals (private parts).  Wash genitals (private parts)       with your normal soap.  6.  Wash thoroughly, paying special attention to the area where your surgery        will be performed.  7.  Thoroughly rinse your body with warm water from the neck down.  8.  DO NOT shower/wash with your normal soap after using and rinsing off       the CHG Soap.  9.  Pat yourself dry with a clean towel.            10.  Wear clean pajamas.            11.  Place clean sheets on your bed the night of your first shower and do not        sleep with pets.  Day of Surgery  Do not apply any lotions/deoderants the morning of surgery.  Please wear clean clothes to the hospital/surgery center.    Please read over the following fact sheets that you were given. Pain Booklet, Coughing and Deep Breathing and Surgical Site Infection Prevention

## 2015-09-21 ENCOUNTER — Encounter (HOSPITAL_COMMUNITY)
Admission: RE | Admit: 2015-09-21 | Discharge: 2015-09-21 | Disposition: A | Discharge status: Home / Self Care | Payer: Medicare Other | Source: Ambulatory Visit | Attending: Orthopedic Surgery | Admitting: Orthopedic Surgery

## 2015-09-21 ENCOUNTER — Encounter (HOSPITAL_COMMUNITY): Payer: Self-pay

## 2015-09-21 DIAGNOSIS — G2581 Restless legs syndrome: Secondary | ICD-10-CM | POA: Insufficient documentation

## 2015-09-21 DIAGNOSIS — K219 Gastro-esophageal reflux disease without esophagitis: Secondary | ICD-10-CM | POA: Diagnosis not present

## 2015-09-21 DIAGNOSIS — Z7984 Long term (current) use of oral hypoglycemic drugs: Secondary | ICD-10-CM | POA: Diagnosis not present

## 2015-09-21 DIAGNOSIS — I10 Essential (primary) hypertension: Secondary | ICD-10-CM | POA: Insufficient documentation

## 2015-09-21 DIAGNOSIS — Z79899 Other long term (current) drug therapy: Secondary | ICD-10-CM | POA: Diagnosis not present

## 2015-09-21 DIAGNOSIS — M19041 Primary osteoarthritis, right hand: Secondary | ICD-10-CM | POA: Insufficient documentation

## 2015-09-21 DIAGNOSIS — Z01818 Encounter for other preprocedural examination: Secondary | ICD-10-CM | POA: Diagnosis present

## 2015-09-21 DIAGNOSIS — I341 Nonrheumatic mitral (valve) prolapse: Secondary | ICD-10-CM | POA: Diagnosis not present

## 2015-09-21 DIAGNOSIS — E119 Type 2 diabetes mellitus without complications: Secondary | ICD-10-CM | POA: Diagnosis not present

## 2015-09-21 DIAGNOSIS — Z01812 Encounter for preprocedural laboratory examination: Secondary | ICD-10-CM | POA: Diagnosis not present

## 2015-09-21 DIAGNOSIS — Z7982 Long term (current) use of aspirin: Secondary | ICD-10-CM | POA: Insufficient documentation

## 2015-09-21 HISTORY — DX: Nocturia: R35.1

## 2015-09-21 HISTORY — DX: Personal history of other infectious and parasitic diseases: Z86.19

## 2015-09-21 HISTORY — DX: Other chronic pain: G89.29

## 2015-09-21 HISTORY — DX: Dorsalgia, unspecified: M54.9

## 2015-09-21 HISTORY — DX: Other specified postprocedural states: Z98.890

## 2015-09-21 HISTORY — DX: Pain in unspecified joint: M25.50

## 2015-09-21 HISTORY — DX: Other seasonal allergic rhinitis: J30.2

## 2015-09-21 HISTORY — DX: Nausea with vomiting, unspecified: R11.2

## 2015-09-21 HISTORY — DX: Personal history of other diseases of the respiratory system: Z87.09

## 2015-09-21 HISTORY — DX: Frequency of micturition: R35.0

## 2015-09-21 LAB — BASIC METABOLIC PANEL
Anion gap: 13 (ref 5–15)
BUN: 21 mg/dL — AB (ref 6–20)
CALCIUM: 9.8 mg/dL (ref 8.9–10.3)
CHLORIDE: 101 mmol/L (ref 101–111)
CO2: 25 mmol/L (ref 22–32)
CREATININE: 1.29 mg/dL — AB (ref 0.44–1.00)
GFR calc non Af Amer: 41 mL/min — ABNORMAL LOW (ref >60)
GFR, EST AFRICAN AMERICAN: 48 mL/min — AB (ref >60)
Glucose, Bld: 136 mg/dL — ABNORMAL HIGH (ref 65–99)
Potassium: 3.8 mmol/L (ref 3.5–5.1)
Sodium: 139 mmol/L (ref 135–145)

## 2015-09-21 LAB — CBC
HCT: 42.8 % (ref 36.0–46.0)
Hemoglobin: 13.8 g/dL (ref 12.0–15.0)
MCH: 29.2 pg (ref 26.0–34.0)
MCHC: 32.2 g/dL (ref 30.0–36.0)
MCV: 90.5 fL (ref 78.0–100.0)
PLATELETS: 291 10*3/uL (ref 150–400)
RBC: 4.73 MIL/uL (ref 3.87–5.11)
RDW: 13.6 % (ref 11.5–15.5)
WBC: 5.6 10*3/uL (ref 4.0–10.5)

## 2015-09-21 LAB — GLUCOSE, CAPILLARY: Glucose-Capillary: 168 mg/dL — ABNORMAL HIGH (ref 65–99)

## 2015-09-21 MED ORDER — CHLORHEXIDINE GLUCONATE 4 % EX LIQD: 60.0000 mL | Freq: Once | CUTANEOUS | Status: DC

## 2015-09-21 NOTE — Progress Notes (Addendum)
Cardiologist denies   Medical Md is Dr.Camille Clarise Cruz has had a couple but most recent 7-8 yrs ago  Stress test denies  Heart cath denies  EKG hasn't had one since around 2010  CXR under care everywhere 07-17-15

## 2015-09-22 LAB — HEMOGLOBIN A1C
HEMOGLOBIN A1C: 9 % — AB (ref 4.8–5.6)
Mean Plasma Glucose: 212 mg/dL

## 2015-09-23 NOTE — Progress Notes (Addendum)
Anesthesia Chart Review: Patient is a 70 year old female scheduled for right thumb trapezium excision with carpometacarpel arthroplasty and tendon transfer on 09/28/15 by Dr. Caralyn Guile.  History includes DM2, non-smoker, post-operative N/V, MVP, RLS, GERD, HTN, back surgery. BMI is consistent with mild obesity. PCP is Dr. Billey Chang. She was seen on 09/15/15 and was aware of surgery plans. Reported elevated glucose levels and two steroid injection for her hand pain. Dr. Jonni Sanger adjusted her DM meds and added Trulicity in hopes to get better DM control prior to surgery.  Meds include amlodipine, ASA, Trulicity, Jardiance, Claritin, metformin, Requip, Micardis HCT, Prilosec, magnesium.   09/21/15 EKG: NSR. Reported last echo > 7 years ago.   Preoperative labs noted. A1c 9.0. (A1c was 9.3 on 09/15/15 at Dr. Tamela Oddi office.). I left a message for patient to call as I wanted to discuss how home CBGs are running. However, there is some improvement just in the last few weeks following adjustment of her DM medications by her PCP. I called A1c result to Cecille Rubin at Dr. Angus Palms office. Patient will get a fasting CBG on arrival. If results are acceptable then I would anticipate that she could proceed as planned. (Update 09/24/15 1:50 PM: Patient reports home CBGs better since DM med adjustment. Fasting CBG this am was 118 and historically are running < 200. She denied any issues from her MVP. No SOB, CP, or significant palpitations. If no acute changes then I anticipate that she can proceed as planned.)  George Hugh [facility]/Anesthesiology Phone [phone] 09/23/2015 2:44 PM

## 2015-09-26 NOTE — H&P (Signed)
[name] is an 70 y.o. female.   Chief Complaint: RIGHT THUMB END STAGE ARTHRITIS HPI: PT WITH LONGSTANDING CMC ARTHRITIS, PT HAS FAILED NONSURGICAL TREATMENT PT HERE FOR SURGERY NO PRIOR SURGERY TO RIGHT THUMB   Past Medical History  Diagnosis Date  . Mitral valve prolapse   . Uterine fibroid   . Internal and external hemorrhoids without complication   . Hx of colonic polyps     benign  . Arthritis   . Restless leg syndrome     takes Requip at bedtime  . GERD (gastroesophageal reflux disease)     takes Omeprazole daily  . Seasonal allergies     takes Claritin daily as needed  . HTN (hypertension)     takes Amlodipine and Micardis daily  . History of bronchitis > 8 yrs ago  . Joint pain   . Chronic back pain     buldging disc,scoliosis,arthritis  . PONV (postoperative nausea and vomiting)     when gets injections in joints gets hives.Betadine rash  . Urinary frequency   . Nocturia   . Diabetes mellitus without complication ([place])     takes Trulicity,Jardiance,and Metformin daily.Average fasting blood sugar runs around130  . History of shingles     Past Surgical History  Procedure Laterality Date  . Hysteroscopy w/d&c  08/23/2000    and resectoscopic myomectomy  . Lumbar disc surgery  03/16/2005  . Plantar fascia release Left 02/03/2010    and torn tendon  . Hemorrhoid surgery      almost 40 yrs ago  . Tonsillectomy    . Wrist surgery      left, removal of cyst  . Tubal ligation    . Colonoscopy  07/12/2004    diverticulosis, internal and external hemorrhoids  . Bunionectomy Bilateral   . Colonoscopy    . Lumbar epidural injection      Family History  Problem Relation Age of Onset  . Colon cancer Neg Hx   . Pancreatic cancer Father    Social History:  reports that she has never smoked. She has never used smokeless tobacco. She reports that she does not drink alcohol or use illicit drugs.  Allergies:  Allergies  Allergen Reactions  . Sitagliptin Other  (See Comments)    Urinary hesitancy  . Ace Inhibitors     cough  . Betadine [Povidone Iodine]     rash  . Codeine Nausea And Vomiting  . Tetracyclines & Related Nausea And Vomiting    No prescriptions prior to admission    No results found for this or any previous visit (from the past 48 hour(s)). No results found.  ROSNO RECENT ILLNESSES OR HOSPITALIZATIONS  There were no vitals taken for this visit. Physical Exam   General Appearance:  Alert, cooperative, no distress, appears stated age  Head:  Normocephalic, without obvious abnormality, atraumatic  Eyes:  Pupils equal, conjunctiva/corneas clear,         Throat: Lips, mucosa, and tongue normal; teeth and gums normal  Neck: No visible masses     Lungs:   respirations unlabored  Chest Wall:  No tenderness or deformity  Heart:  Regular rate and rhythm,  Abdomen:   Soft, non-tender,         Extremities: RIGHT THUMB: SKIN INTACT FINGERS WARM WELL PERFUSED TENDER TO PALPATION OVER THUMB CMC JOINT +CMC CREPITUS NO MP HYPEREXTENSION GOOD THUMB IP MOTION GOOD WRIST AND DIGITAL MOBILITY  Pulses: 2+ and symmetric  Skin: Skin color, texture, turgor  normal, no rashes or lesions     Neurologic: Normal    Assessment/Plan RIGHT THUMB END STAGE CARPOMETACARPAL ARTHRITIS, BONE ON BONE ARTHRITIS  RIGHT THUMB CARPOMETACARPAL ARTHROPLASTY AND TENDON TRANSFER, SUSPENSIONPLASTY  R/B/A DISCUSSED WITH PT IN OFFICE.  PT VOICED UNDERSTANDING OF PLAN CONSENT SIGNED DAY OF SURGERY PT SEEN AND EXAMINED PRIOR TO OPERATIVE PROCEDURE/DAY OF SURGERY SITE MARKED. QUESTIONS ANSWERED WILL GO HOME FOLLOWING SURGERY  WE ARE PLANNING SURGERY FOR YOUR UPPER EXTREMITY. THE RISKS AND BENEFITS OF SURGERY INCLUDE BUT NOT LIMITED TO BLEEDING INFECTION, DAMAGE TO NEARBY NERVES ARTERIES TENDONS, FAILURE OF SURGERY TO ACCOMPLISH ITS INTENDED GOALS, PERSISTENT SYMPTOMS AND NEED FOR FURTHER SURGICAL INTERVENTION. WITH THIS IN MIND WE WILL PROCEED. I HAVE  DISCUSSED WITH THE PATIENT THE PRE AND POSTOPERATIVE REGIMEN AND THE DOS AND DON'TS. PT VOICED UNDERSTANDING AND INFORMED CONSENT SIGNED.  Linna Hoff 09/28/2015 @ 1432

## 2015-09-26 NOTE — Brief Op Note (Signed)
09/28/2015  9:47 AM  PATIENT:  [name]  70 y.o. female  PRE-OPERATIVE DIAGNOSIS:  right thumb end stage CMC osteoarthritis  POST-OPERATIVE DIAGNOSIS:  * No post-op diagnosis entered *  PROCEDURE:  Procedure(s): RIGHT THUMB TRAPEZIUM EXCISION WITH CARPOMETACARPEL (CMC) ARTHROPLASTY AND TENDON TRANSFER (Right) TENDON TRANSFER (Right)  SURGEON:  Surgeon(s) and Role:    * Iran Planas, MD - Primary  PHYSICIAN ASSISTANT: Chabon  PAC  ASSISTANTS: none   ANESTHESIA:   general  EBL:     BLOOD ADMINISTERED:none  DRAINS: none   LOCAL MEDICATIONS USED:  MARCAINE     SPECIMEN:  No Specimen  DISPOSITION OF SPECIMEN:  N/A  COUNTS:  YES  TOURNIQUET:  * No tourniquets in log *  DICTATION: .Other Dictation: Dictation Number [id number]  PLAN OF CARE: Discharge to home after PACU  PATIENT DISPOSITION:  PACU - hemodynamically stable.   Delay start of Pharmacological VTE agent (>24hrs) due to surgical blood loss or risk of bleeding: not applicable

## 2015-09-27 MED ORDER — CEFAZOLIN SODIUM-DEXTROSE 2-3 GM-% IV SOLR
2.0000 g | INTRAVENOUS | Status: AC
  Administered 2015-09-28: 2 g via INTRAVENOUS
  Filled 2015-09-27: qty 50

## 2015-09-28 ENCOUNTER — Encounter (HOSPITAL_COMMUNITY)
Admission: RE | Disposition: A | Discharge status: Home / Self Care | Payer: Self-pay | Source: Ambulatory Visit | Attending: Orthopedic Surgery

## 2015-09-28 ENCOUNTER — Ambulatory Visit (HOSPITAL_COMMUNITY): Payer: Medicare Other | Admitting: Certified Registered Nurse Anesthetist

## 2015-09-28 ENCOUNTER — Ambulatory Visit (HOSPITAL_COMMUNITY): Payer: Medicare Other | Admitting: Vascular Surgery

## 2015-09-28 ENCOUNTER — Encounter (HOSPITAL_COMMUNITY): Payer: Self-pay | Admitting: Surgery

## 2015-09-28 ENCOUNTER — Ambulatory Visit (HOSPITAL_COMMUNITY)
Admission: RE | Admit: 2015-09-28 | Discharge: 2015-09-28 | Disposition: A | Discharge status: Home / Self Care | Payer: Medicare Other | Source: Ambulatory Visit | Attending: Orthopedic Surgery | Admitting: Orthopedic Surgery

## 2015-09-28 DIAGNOSIS — I10 Essential (primary) hypertension: Secondary | ICD-10-CM | POA: Diagnosis not present

## 2015-09-28 DIAGNOSIS — M189 Osteoarthritis of first carpometacarpal joint, unspecified: Secondary | ICD-10-CM | POA: Diagnosis present

## 2015-09-28 DIAGNOSIS — E119 Type 2 diabetes mellitus without complications: Secondary | ICD-10-CM | POA: Diagnosis not present

## 2015-09-28 DIAGNOSIS — G2581 Restless legs syndrome: Secondary | ICD-10-CM | POA: Insufficient documentation

## 2015-09-28 DIAGNOSIS — G8929 Other chronic pain: Secondary | ICD-10-CM | POA: Diagnosis not present

## 2015-09-28 DIAGNOSIS — K219 Gastro-esophageal reflux disease without esophagitis: Secondary | ICD-10-CM | POA: Diagnosis not present

## 2015-09-28 DIAGNOSIS — Z7984 Long term (current) use of oral hypoglycemic drugs: Secondary | ICD-10-CM | POA: Diagnosis not present

## 2015-09-28 DIAGNOSIS — Z79899 Other long term (current) drug therapy: Secondary | ICD-10-CM | POA: Diagnosis not present

## 2015-09-28 DIAGNOSIS — Z7982 Long term (current) use of aspirin: Secondary | ICD-10-CM | POA: Diagnosis not present

## 2015-09-28 HISTORY — PX: TENDON TRANSFER: SHX6109

## 2015-09-28 HISTORY — PX: FINGER ARTHROSCOPY WITH CARPOMETACARPEL (CMC) ARTHROPLASTY: SHX5629

## 2015-09-28 LAB — GLUCOSE, CAPILLARY
GLUCOSE-CAPILLARY: 84 mg/dL (ref 65–99)
GLUCOSE-CAPILLARY: 78 mg/dL (ref 65–99)
Glucose-Capillary: 89 mg/dL (ref 65–99)

## 2015-09-28 SURGERY — FINGER ARTHROSCOPY WITH CARPOMETACARPEL (CMC) ARTHROPLASTY
Anesthesia: Regional | Site: Thumb | Laterality: Right

## 2015-09-28 MED ORDER — FENTANYL CITRATE (PF) 250 MCG/5ML IJ SOLN
INTRAMUSCULAR | Status: DC | PRN
  Administered 2015-09-28: 100 ug via INTRAVENOUS
  Administered 2015-09-28: 25 ug via INTRAVENOUS

## 2015-09-28 MED ORDER — ONDANSETRON HCL 4 MG/2ML IJ SOLN
INTRAMUSCULAR | Status: DC | PRN
  Administered 2015-09-28: 4 mg via INTRAVENOUS

## 2015-09-28 MED ORDER — BUPIVACAINE-EPINEPHRINE (PF) 0.5% -1:200000 IJ SOLN
INTRAMUSCULAR | Status: DC | PRN
  Administered 2015-09-28: 25 mL via PERINEURAL

## 2015-09-28 MED ORDER — PROPOFOL 10 MG/ML IV BOLUS
INTRAVENOUS | Status: DC | PRN
  Administered 2015-09-28: 150 mg via INTRAVENOUS
  Administered 2015-09-28: 20 mg via INTRAVENOUS

## 2015-09-28 MED ORDER — FENTANYL CITRATE (PF) 250 MCG/5ML IJ SOLN
INTRAMUSCULAR | Status: AC
  Filled 2015-09-28: qty 5

## 2015-09-28 MED ORDER — FENTANYL CITRATE (PF) 100 MCG/2ML IJ SOLN
100.0000 ug | Freq: Once | INTRAMUSCULAR | Status: AC
  Administered 2015-09-28: 100 ug via INTRAVENOUS

## 2015-09-28 MED ORDER — PHENYLEPHRINE HCL 10 MG/ML IJ SOLN
10.0000 mg | INTRAMUSCULAR | Status: DC | PRN
  Administered 2015-09-28: 40 ug/min via INTRAVENOUS

## 2015-09-28 MED ORDER — MIDAZOLAM HCL 2 MG/2ML IJ SOLN
INTRAMUSCULAR | Status: AC
Start: 2015-09-28 — End: 2015-09-28
  Administered 2015-09-28: 2 mg via INTRAVENOUS
  Filled 2015-09-28: qty 2

## 2015-09-28 MED ORDER — FENTANYL CITRATE (PF) 100 MCG/2ML IJ SOLN: 25.0000 ug | INTRAMUSCULAR | Status: DC | PRN

## 2015-09-28 MED ORDER — DEXAMETHASONE SODIUM PHOSPHATE 4 MG/ML IJ SOLN
INTRAMUSCULAR | Status: DC | PRN
  Administered 2015-09-28: 4 mg via INTRAVENOUS

## 2015-09-28 MED ORDER — PHENYLEPHRINE HCL 10 MG/ML IJ SOLN
INTRAMUSCULAR | Status: DC | PRN
  Administered 2015-09-28 (×6): 80 ug via INTRAVENOUS

## 2015-09-28 MED ORDER — MIDAZOLAM HCL 2 MG/2ML IJ SOLN
INTRAMUSCULAR | Status: AC
  Filled 2015-09-28: qty 2

## 2015-09-28 MED ORDER — LACTATED RINGERS IV SOLN
INTRAVENOUS | Status: DC
  Administered 2015-09-28 (×3): via INTRAVENOUS

## 2015-09-28 MED ORDER — FENTANYL CITRATE (PF) 100 MCG/2ML IJ SOLN
INTRAMUSCULAR | Status: AC
  Administered 2015-09-28: 100 ug via INTRAVENOUS
  Filled 2015-09-28: qty 2

## 2015-09-28 MED ORDER — DEXAMETHASONE SODIUM PHOSPHATE 4 MG/ML IJ SOLN
INTRAMUSCULAR | Status: AC
  Filled 2015-09-28: qty 2

## 2015-09-28 MED ORDER — HYDROCODONE-ACETAMINOPHEN 7.5-325 MG PO TABS: 1.0000 | ORAL_TABLET | Freq: Once | ORAL | Status: DC | PRN

## 2015-09-28 MED ORDER — PROPOFOL 10 MG/ML IV BOLUS
INTRAVENOUS | Status: AC
  Filled 2015-09-28: qty 20

## 2015-09-28 MED ORDER — DOCUSATE SODIUM 100 MG PO CAPS: 100.0000 mg | ORAL_CAPSULE | Freq: Two times a day (BID) | ORAL | Status: DC

## 2015-09-28 MED ORDER — METHOCARBAMOL 500 MG PO TABS: 500.0000 mg | ORAL_TABLET | Freq: Four times a day (QID) | ORAL | Status: DC

## 2015-09-28 MED ORDER — 0.9 % SODIUM CHLORIDE (POUR BTL) OPTIME
TOPICAL | Status: DC | PRN
  Administered 2015-09-28: 1000 mL

## 2015-09-28 MED ORDER — EPHEDRINE SULFATE 50 MG/ML IJ SOLN
INTRAMUSCULAR | Status: AC
  Filled 2015-09-28: qty 1

## 2015-09-28 MED ORDER — ONDANSETRON HCL 4 MG PO TABS: 4.0000 mg | ORAL_TABLET | Freq: Three times a day (TID) | ORAL | Status: DC | PRN

## 2015-09-28 MED ORDER — PROMETHAZINE HCL 25 MG/ML IJ SOLN: 6.2500 mg | INTRAMUSCULAR | Status: DC | PRN

## 2015-09-28 MED ORDER — LIDOCAINE HCL (CARDIAC) 20 MG/ML IV SOLN
INTRAVENOUS | Status: DC | PRN
  Administered 2015-09-28: 60 mg via INTRAVENOUS

## 2015-09-28 MED ORDER — OXYCODONE-ACETAMINOPHEN 5-325 MG PO TABS: 1.0000 | ORAL_TABLET | ORAL | Status: DC | PRN

## 2015-09-28 SURGICAL SUPPLY — 54 items
ANCHOR SUTBIO SWIVELK 3.5X15.8 (Anchor) ×2 IMPLANT
BANDAGE ELASTIC 3 VELCRO ST LF (GAUZE/BANDAGES/DRESSINGS) ×2 IMPLANT
BANDAGE ELASTIC 4 VELCRO ST LF (GAUZE/BANDAGES/DRESSINGS) ×2 IMPLANT
BLADE LONG MED 31X9 (MISCELLANEOUS) ×2 IMPLANT
BLADE SURG ROTATE 9660 (MISCELLANEOUS) IMPLANT
BNDG ESMARK 4X9 LF (GAUZE/BANDAGES/DRESSINGS) ×2 IMPLANT
BNDG GAUZE ELAST 4 BULKY (GAUZE/BANDAGES/DRESSINGS) ×2 IMPLANT
CORDS BIPOLAR (ELECTRODE) ×2 IMPLANT
COVER SURGICAL LIGHT HANDLE (MISCELLANEOUS) ×2 IMPLANT
CUFF TOURNIQUET SINGLE 18IN (TOURNIQUET CUFF) ×2 IMPLANT
CUFF TOURNIQUET SINGLE 24IN (TOURNIQUET CUFF) IMPLANT
DRAIN TLS ROUND 10FR (DRAIN) IMPLANT
DRAPE OEC MINIVIEW 54X84 (DRAPES) ×2 IMPLANT
DRAPE SURG 17X11 SM STRL (DRAPES) ×2 IMPLANT
DRSG ADAPTIC 3X8 NADH LF (GAUZE/BANDAGES/DRESSINGS) ×2 IMPLANT
ELECT REM PT RETURN 9FT ADLT (ELECTROSURGICAL)
ELECTRODE REM PT RTRN 9FT ADLT (ELECTROSURGICAL) IMPLANT
GAUZE SPONGE 4X4 12PLY STRL (GAUZE/BANDAGES/DRESSINGS) ×2 IMPLANT
GAUZE SPONGE 4X4 16PLY XRAY LF (GAUZE/BANDAGES/DRESSINGS) ×2 IMPLANT
GLOVE BIOGEL PI IND STRL 8.5 (GLOVE) ×1 IMPLANT
GLOVE BIOGEL PI INDICATOR 8.5 (GLOVE) ×1
GLOVE SURG ORTHO 8.0 STRL STRW (GLOVE) ×2 IMPLANT
GOWN STRL REUS W/ TWL LRG LVL3 (GOWN DISPOSABLE) ×3 IMPLANT
GOWN STRL REUS W/ TWL XL LVL3 (GOWN DISPOSABLE) ×1 IMPLANT
GOWN STRL REUS W/TWL LRG LVL3 (GOWN DISPOSABLE) ×3
GOWN STRL REUS W/TWL XL LVL3 (GOWN DISPOSABLE) ×1
KIT ASCP FXDISP 3X8XBTNDS (KITS) ×1 IMPLANT
KIT BASIN OR (CUSTOM PROCEDURE TRAY) ×2 IMPLANT
KIT BIO-TENODESIS 3X8 DISP (KITS) ×1
KIT ROOM TURNOVER OR (KITS) ×2 IMPLANT
MANIFOLD NEPTUNE II (INSTRUMENTS) ×2 IMPLANT
NEEDLE HYPO 25X1 1.5 SAFETY (NEEDLE) ×2 IMPLANT
NS IRRIG 1000ML POUR BTL (IV SOLUTION) ×2 IMPLANT
PACK ORTHO EXTREMITY (CUSTOM PROCEDURE TRAY) ×2 IMPLANT
PAD ARMBOARD 7.5X6 YLW CONV (MISCELLANEOUS) ×4 IMPLANT
PAD CAST 4YDX4 CTTN HI CHSV (CAST SUPPLIES) ×1 IMPLANT
PADDING CAST COTTON 4X4 STRL (CAST SUPPLIES) ×1
SCREW BIOCOM TENODESIS 3.8X8M (Screw) ×2 IMPLANT
SLING ARM FOAM STRAP MED (SOFTGOODS) ×2 IMPLANT
SOAP 2 % CHG 4 OZ (WOUND CARE) ×2 IMPLANT
SPONGE SURGIFOAM ABS GEL SZ50 (HEMOSTASIS) ×2 IMPLANT
STRIP CLOSURE SKIN 1/2X4 (GAUZE/BANDAGES/DRESSINGS) IMPLANT
SUT ETHILON 4 0 PS 2 18 (SUTURE) ×2 IMPLANT
SUT MNCRL AB 4-0 PS2 18 (SUTURE) ×4 IMPLANT
SUT VIC AB 2-0 FS1 27 (SUTURE) ×4 IMPLANT
SUT VICRYL 4-0 PS2 18IN ABS (SUTURE) IMPLANT
SYR CONTROL 10ML LL (SYRINGE) IMPLANT
SYSTEM CHEST DRAIN TLS 7FR (DRAIN) IMPLANT
TENODESIS KIT ×2 IMPLANT
TOWEL OR 17X24 6PK STRL BLUE (TOWEL DISPOSABLE) IMPLANT
TOWEL OR 17X26 10 PK STRL BLUE (TOWEL DISPOSABLE) ×2 IMPLANT
TUBE CONNECTING 12X1/4 (SUCTIONS) ×2 IMPLANT
WATER STERILE IRR 1000ML POUR (IV SOLUTION) IMPLANT
YANKAUER SUCT BULB TIP NO VENT (SUCTIONS) IMPLANT

## 2015-09-28 NOTE — Anesthesia Procedure Notes (Addendum)
Anesthesia Regional Block:  Axillary brachial plexus block  Pre-Anesthetic Checklist: ,, timeout performed, Correct Patient, Correct Site, Correct Laterality, Correct Procedure, Correct Position, site marked, Risks and benefits discussed,  Surgical consent,  Pre-op evaluation,  At surgeon's request and post-op pain management  Laterality: Right  Prep: chloraprep       Needles:  Injection technique: Single-shot  Needle Type: Echogenic Stimulator Needle     Needle Length: 10cm 10 cm Needle Gauge: 21 and 21 G    Additional Needles:  Procedures: ultrasound guided (picture in chart) and nerve stimulator Axillary brachial plexus block  Nerve Stimulator or Paresthesia:  Response: median, 0.5 mA,  Response: radial, 0.5 mA,   Additional Responses:   Narrative:  Start time: 09/28/2015 2:09 PM End time: 09/28/2015 2:19 PM Injection made incrementally with aspirations every 5 mL.  Performed by: Personally  Anesthesiologist: Suzette Battiest   Procedure Name: LMA Insertion Date/Time: 09/28/2015 3:31 PM Performed by: Ollen Bowl Pre-anesthesia Checklist: Patient identified, Emergency Drugs available, Suction available, Patient being monitored and Timeout performed Patient Re-evaluated:Patient Re-evaluated prior to inductionOxygen Delivery Method: Circle system utilized and Simple face mask Preoxygenation: Pre-oxygenation with 100% oxygen Intubation Type: IV induction Ventilation: Mask ventilation without difficulty LMA: LMA inserted LMA Size: 5.0 Number of attempts: 1 Airway Equipment and Method: Patient positioned with wedge pillow Placement Confirmation: positive ETCO2 and breath sounds checked- equal and bilateral Tube secured with: Tape Dental Injury: Teeth and Oropharynx as per pre-operative assessment

## 2015-09-28 NOTE — Transfer of Care (Signed)
Immediate Anesthesia Transfer of Care Note  Patient: [name]  Procedure(s) Performed: Procedure(s): RIGHT THUMB TRAPEZIUM EXCISION WITH CARPOMETACARPEL ([place]) ARTHROPLASTY AND TENDON TRANSFER (Right) TENDON TRANSFER (Right)  Patient Location: PACU  Anesthesia Type:GA combined with regional for post-op pain  Level of Consciousness: awake and alert   Airway & Oxygen Therapy: Patient Spontanous Breathing and Patient connected to nasal cannula oxygen  Post-op Assessment: Report given to RN and Post -op Vital signs reviewed and stable  Post vital signs: Reviewed and stable  Last Vitals:  Filed Vitals:   09/28/15 1423 09/28/15 1427  BP: 150/52 159/50  Pulse: 77 82  Temp:    Resp: 13 14    Complications: No apparent anesthesia complications

## 2015-09-28 NOTE — Anesthesia Preprocedure Evaluation (Signed)
Anesthesia Evaluation  Patient identified by MRN, date of birth, ID band Patient awake    Reviewed: Allergy & Precautions, NPO status , Patient's Chart, lab work & pertinent test results  History of Anesthesia Complications (+) PONV  Airway Mallampati: II  TM Distance: >3 FB Neck ROM: Full    Dental  (+) Dental Advisory Given   Pulmonary neg pulmonary ROS,    breath sounds clear to auscultation       Cardiovascular hypertension, Pt. on medications  Rhythm:Regular Rate:Normal     Neuro/Psych negative neurological ROS     GI/Hepatic Neg liver ROS, GERD  ,  Endo/Other  diabetes, Type 2, Oral Hypoglycemic Agents  Renal/GU      Musculoskeletal  (+) Arthritis ,   Abdominal   Peds  Hematology negative hematology ROS (+)   Anesthesia Other Findings   Reproductive/Obstetrics                             Lab Results  Component Value Date   WBC 5.6 09/21/2015   HGB 13.8 09/21/2015   HCT 42.8 09/21/2015   MCV 90.5 09/21/2015   PLT 291 09/21/2015   Lab Results  Component Value Date   CREATININE 1.29* 09/21/2015   BUN 21* 09/21/2015   NA 139 09/21/2015   K 3.8 09/21/2015   CL 101 09/21/2015   CO2 25 09/21/2015    Anesthesia Physical Anesthesia Plan  ASA: II  Anesthesia Plan: General and Regional   Post-op Pain Management: GA combined w/ Regional for post-op pain   Induction: Intravenous  Airway Management Planned: LMA  Additional Equipment:   Intra-op Plan:   Post-operative Plan: Extubation in OR  Informed Consent: I have reviewed the patients History and Physical, chart, labs and discussed the procedure including the risks, benefits and alternatives for the proposed anesthesia with the patient or authorized representative who has indicated his/her understanding and acceptance.   Dental advisory given  Plan Discussed with: CRNA  Anesthesia Plan Comments:          Anesthesia Quick Evaluation

## 2015-09-28 NOTE — Discharge Instructions (Signed)
KEEP BANDAGE CLEAN AND DRY °CALL OFFICE FOR F/U APPT [phone] in 2 weeks °DR Fermon Ureta CELL [phone] °KEEP HAND ELEVATED ABOVE HEART °OK TO APPLY ICE TO OPERATIVE AREA °CONTACT OFFICE IF ANY WORSENING PAIN OR CONCERNS. °

## 2015-09-28 NOTE — Anesthesia Postprocedure Evaluation (Signed)
Anesthesia Post Note  Patient: AHNESTI SABATELLI  Procedure(s) Performed: Procedure(s) (LRB): RIGHT THUMB TRAPEZIUM EXCISION WITH CARPOMETACARPEL ([place]) ARTHROPLASTY AND TENDON TRANSFER (Right) TENDON TRANSFER (Right)  Patient location during evaluation: PACU Anesthesia Type: General Level of consciousness: awake and alert Pain management: pain level controlled Vital Signs Assessment: post-procedure vital signs reviewed and stable Respiratory status: spontaneous breathing, nonlabored ventilation and respiratory function stable Cardiovascular status: blood pressure returned to baseline and stable Postop Assessment: no signs of nausea or vomiting Anesthetic complications: no    Last Vitals:  Filed Vitals:   09/28/15 1745 09/28/15 1754  BP: 140/71 144/68  Pulse: 76 78  Temp:  36.7 C  Resp: 10 16    Last Pain:  Filed Vitals:   09/28/15 1755  PainSc: Asleep                 Cozette Braggs A

## 2015-09-29 ENCOUNTER — Encounter (HOSPITAL_COMMUNITY): Payer: Self-pay | Admitting: Orthopedic Surgery

## 2015-09-29 NOTE — Op Note (Signed)
[name], [name]                ACCOUNT NO.:  [id number]  MEDICAL RECORD NO.:  [id number]  LOCATION:  [facility]                         FACILITY:  [facility]  PHYSICIAN:  Linna Hoff IV, M.D.DATE OF BIRTH:  01/01/1946  DATE OF PROCEDURE:  09/28/2015 DATE OF DISCHARGE:  09/28/2015                              OPERATIVE REPORT   PREOPERATIVE DIAGNOSIS:  Right thumb end-stage carpometacarpal arthritis, bone-on-bone arthritis.  POSTOPERATIVE DIAGNOSIS:  Right thumb end-stage carpometacarpal arthritis, bone-on-bone arthritis.  ATTENDING SURGEON:  Linna Hoff, M.D., who scrubbed and present for the entire procedure.  ASSISTANT SURGEON:  None.  ANESTHESIA:  General via LMA with regional block performed by Dr. Ilda Foil.  SURGICAL PROCEDURES: 1. Right thumb carpometacarpal arthroplasty and trapezium excision. 2. Right thumb abductor pollicis longus tendon transfer and     suspensionplasty, tendon transfer, dorsum of the hand. 3. Radiographs 3 views, right thumb.  SURGICAL IMPLANTS:  Arthrex 3.5 mm SwiveLock and 3.0 Bio-Tenodesis screw.  RADIOGRAPHIC INTERPRETATION:  AP and lateral and __________ views of the thumb did show the [facility] suspensionplasty in good position with excision of trapezium.  SURGICAL INDICATION:  [name] is a right-hand dominant female with end- stage bone-on-bone osteoarthritis.  The patient failed nonsurgical treatment.  Risks, benefits, and alternatives were discussed in detail with the patient and signed informed consent was obtained.  Risks include, but not limited to bleeding; infection; damage to nearby nerves, arteries, or tendons; loss of motion of wrist and digits; incomplete relief of symptoms; instability; and need for further surgical intervention.  DESCRIPTION OF PROCEDURE:  The patient was properly identified in the preoperative holding area, marked with a permanent marker made on the right thumb to indicate the correct operative site.  The  patient was brought back to the operating room, placed supine on the anesthesia room table.  General anesthesia was administered.  The patient tolerated this well.  A well-padded tourniquet was then placed on the right brachium, sealed with 1000 drape.  Right upper extremity was then prepped and draped in normal sterile fashion.  Time-out was called.  The correct site was identified, and the procedure was then begun.  Attention then turned to the right hand.  A longitudinal incision made directly over the thumb basilar joint.  The limb had been elevated and tourniquet insufflated.  Dissection was carried down through skin and subcutaneous tissue.  The interval between the extensor mechanism incised longitudinally.  Thick capsular flaps were then carried out, and a capsular release was then carried down, exposing the trapezium.  Careful protection of the radial artery was then done.  The STT joint was then identified and the trapezium was then carefully elevated and exposed.  Once exposure was then maintained and the trapezium was then excised.  Small osteotomes were used to break up the trapezium and the trapezium was removed in 3 large fragments.  This cleared up the joint nicely with careful protection of the FCR tendon, trying not to violate the FCR tendon.  Once this was carried out, the joint was then thoroughly irrigated.  Any loose bone fragments were then removed with a small rongeur.  The metacarpal was then prepared and the 3.5  mm drill bit was then drilled from a dorsal to volar direction, creating the tunnel for the tendon transfer.  The 3.0 mm drill bit was then used for the index metacarpal to create the suspensionplasty.  Once this was carried out, a separate incision made proximally, and the adductor pollicis longus tendon was then harvested.  The ulnar slip was then harvested, resected proximally and __________ wound distally.  This was split in half, creating a double  tendon transfer.  This was then passed beneath into the capsule, and the first tendon was then passed through the thumb metacarpal and back onto itself.  The SwiveLock screw was then placed to set the tension on the tendon.  This was also reinforced with the FiberWire suture and sewn back on itself.  The other tendon was then used for the tendon transfer to the index metacarpal.  The tendon was then prepared and then passed through the drill hole and then out the index metacarpal.  Tension was then appropriately set with the appropriate tension, making sure not to overtighten and cause impingement on the index metacarpal.  The tenodesis screw was then placed, setting and creating a suspensionplasty.  The wound was irrigated.  The remaining portion of the tendon was then anchovied, creating the rest of the arthroplasty with a FiberWire suture.  The capsule was then closed.  Thorough wound irrigation done.  Final radiographs were then obtained.  Gelfoam was then placed into the arthroplasty site.  Thick capsular closure was done with a 4.0 FiberWire suture.  Subcutaneous tissues closed with Monocryl, and skin was closed with running 4-0 Prolene.  Adaptic dressing and sterile compressive bandage were then applied.  The patient was placed in a well-padded thumb spica splint, extubated, and taken to recovery room in good condition.  POSTPROCEDURAL PLAN:  The patient discharged home, will be seen back in the office in approximately 2 weeks for wound check, suture removal, x- rays, application of short-arm thumb spica cast for a total of 6 weeks for immobilization, then begin outpatient therapy regimen.  The first visit will be for the postoperative visit, eval and treat for therapy, and begin the therapy at the 6-week mark.  Radiographs at each visit.     Melrose Nakayama, M.D.     FWO/MEDQ  D:  09/28/2015  T:  09/28/2015  Job:  [id number]

## 2015-09-29 NOTE — Op Note (Signed)
[name], [name]                ACCOUNT NO.:  [id number]  MEDICAL RECORD NO.:  [id number]  LOCATION:  [facility]                         FACILITY:  [facility]  PHYSICIAN:  Linna Hoff IV, M.D.DATE OF BIRTH:  Dec 23, 1945  DATE OF PROCEDURE:  09/28/2015 DATE OF DISCHARGE:  09/28/2015                              OPERATIVE REPORT   ADDENDUM:  SURGICAL ASSISTANT:  Molli Barrows, PA-C, who scrubbed and assisted for the entire procedure to help aid in trapezium excision, arthroplasty, and wound closure.     Melrose Nakayama, M.D.     FWO/MEDQ  D:  09/28/2015  T:  09/29/2015  Job:  [id number]

## 2015-10-06 ENCOUNTER — Encounter (HOSPITAL_COMMUNITY): Payer: Self-pay | Admitting: Orthopedic Surgery

## 2015-10-14 ENCOUNTER — Encounter (HOSPITAL_COMMUNITY): Payer: Self-pay | Admitting: Orthopedic Surgery

## 2015-10-21 ENCOUNTER — Encounter (HOSPITAL_COMMUNITY): Payer: Self-pay | Admitting: Orthopedic Surgery

## 2016-04-13 ENCOUNTER — Other Ambulatory Visit: Payer: Self-pay | Admitting: Family Medicine

## 2016-04-13 DIAGNOSIS — E2839 Other primary ovarian failure: Secondary | ICD-10-CM

## 2016-04-22 ENCOUNTER — Ambulatory Visit
Admission: RE | Admit: 2016-04-22 | Discharge: 2016-04-22 | Disposition: A | Discharge status: Home / Self Care | Payer: Medicare Other | Source: Ambulatory Visit | Attending: Family Medicine | Admitting: Family Medicine

## 2016-04-22 DIAGNOSIS — E2839 Other primary ovarian failure: Secondary | ICD-10-CM

## 2016-06-27 ENCOUNTER — Other Ambulatory Visit: Payer: Self-pay | Admitting: Family Medicine

## 2016-06-27 DIAGNOSIS — Z1231 Encounter for screening mammogram for malignant neoplasm of breast: Secondary | ICD-10-CM

## 2016-07-15 ENCOUNTER — Emergency Department (HOSPITAL_BASED_OUTPATIENT_CLINIC_OR_DEPARTMENT_OTHER)
Admission: EM | Admit: 2016-07-15 | Discharge: 2016-07-15 | Disposition: A | Discharge status: Home / Self Care | Payer: Medicare Other | Attending: Emergency Medicine | Admitting: Emergency Medicine

## 2016-07-15 ENCOUNTER — Encounter (HOSPITAL_BASED_OUTPATIENT_CLINIC_OR_DEPARTMENT_OTHER): Payer: Self-pay | Admitting: *Deleted

## 2016-07-15 DIAGNOSIS — Z79899 Other long term (current) drug therapy: Secondary | ICD-10-CM | POA: Insufficient documentation

## 2016-07-15 DIAGNOSIS — R21 Rash and other nonspecific skin eruption: Secondary | ICD-10-CM | POA: Diagnosis present

## 2016-07-15 DIAGNOSIS — E119 Type 2 diabetes mellitus without complications: Secondary | ICD-10-CM | POA: Insufficient documentation

## 2016-07-15 DIAGNOSIS — Z7982 Long term (current) use of aspirin: Secondary | ICD-10-CM | POA: Diagnosis not present

## 2016-07-15 DIAGNOSIS — I10 Essential (primary) hypertension: Secondary | ICD-10-CM | POA: Insufficient documentation

## 2016-07-15 MED ORDER — FAMOTIDINE 20 MG PO TABS
20.0000 mg | ORAL_TABLET | Freq: Once | ORAL | Status: AC
  Administered 2016-07-15: 20 mg via ORAL
  Filled 2016-07-15: qty 1

## 2016-07-15 MED ORDER — FAMOTIDINE 20 MG PO TABS: 20.0000 mg | ORAL_TABLET | Freq: Two times a day (BID) | ORAL | 0 refills | Status: DC

## 2016-07-15 MED ORDER — PREDNISONE 20 MG PO TABS: ORAL_TABLET | ORAL | 0 refills | Status: DC

## 2016-07-15 MED ORDER — METHYLPREDNISOLONE SODIUM SUCC 125 MG IJ SOLR
125.0000 mg | Freq: Once | INTRAMUSCULAR | Status: AC
  Administered 2016-07-15: 125 mg via INTRAMUSCULAR
  Filled 2016-07-15: qty 2

## 2016-07-15 MED FILL — predniSONE 20 MG TABS: 20 | 9 days supply | Qty: 12 | Fill #0

## 2016-07-15 MED FILL — FAMOTIDINE 20 MG TABLET: 20 | 15 days supply | Qty: 30 | Fill #0

## 2016-07-15 NOTE — ED Triage Notes (Signed)
Rash for over a week. Urticaria.

## 2016-07-15 NOTE — Discharge Instructions (Signed)
Start the prednisone tomorrow, you have already had today's dose.  Pepcid will help with the itching.  Continue benadryl as well.  Follow-up with your primary care doctor. Return to the ED for new or worsening symptoms, especially for any oral swelling or shortness of breath.

## 2016-07-15 NOTE — ED Provider Notes (Signed)
[facility] DEPT [facility] Provider Note   CSN: [id number] Arrival date & time: 07/15/16  1617  By signing my name below, I, [name], attest that this documentation has been prepared under the direction and in the presence of Quincy Carnes, PA-C. Electronically Signed: Judithann Sauger, ED Scribe. 07/15/16. 4:48 PM.   History   Chief Complaint Chief Complaint  Patient presents with  . Rash    HPI Comments: [name] is a 70 y.o. female with a hx of GERD, DM, seasonal allergies, and hypertension who presents to the Emergency Department complaining of gradually worsening moderately itchy red rash to her back, buttock, abdomen, bilateral legs onset one week ago. She notes that the rash began on her back and has since spread to other parts. She denies any recent changes in her medications, lotions, or detergents but she states that she was using bath crystals for the past few months but discontinued this after the rash appeared with change. No alleviating factors noted. No new medications or foods.  She states that she tried Benadryl and Zyrtec (last dose was 2 hours ago) PTA with no relief. She explains that she was evaluated by her PCP yesterday who prescribed her aTriamcinolone cream but her symptoms are still worsneing. Pt has an allergy to Sitagliptin, Ace inhibitors, Betadine, codeine, and tetracycline. She denies any fever, chills, shortness of breath, wheezing, vomiting, lip/tongue swelling, trouble swallowing, or any other symptoms.   The history is provided by the patient. No language interpreter was used.    Past Medical History:  Diagnosis Date  . Arthritis   . Chronic back pain    buldging disc,scoliosis,arthritis  . Diabetes mellitus without complication ([place])    takes Trulicity,Jardiance,and Metformin daily.Average fasting blood sugar runs around130  . GERD (gastroesophageal reflux disease)    takes Omeprazole daily  . History of bronchitis > 8 yrs ago  .  History of shingles   . HTN (hypertension)    takes Amlodipine and Micardis daily  . Hx of colonic polyps    benign  . Internal and external hemorrhoids without complication   . Joint pain   . Mitral valve prolapse   . Nocturia   . PONV (postoperative nausea and vomiting)    when gets injections in joints gets hives.Betadine rash  . Restless leg syndrome    takes Requip at bedtime  . Seasonal allergies    takes Claritin daily as needed  . Urinary frequency   . Uterine fibroid     Patient Active Problem List   Diagnosis Date Noted  . GERD (gastroesophageal reflux disease) 05/25/2011  . Hemorrhoids, internal, with bleeding 05/25/2011  . Obesity 05/25/2011    Past Surgical History:  Procedure Laterality Date  . BUNIONECTOMY Bilateral   . COLONOSCOPY  07/12/2004   diverticulosis, internal and external hemorrhoids  . COLONOSCOPY    . FINGER ARTHROSCOPY WITH CARPOMETACARPEL [facility]) ARTHROPLASTY Right 09/28/2015   Procedure: RIGHT THUMB TRAPEZIUM EXCISION WITH CARPOMETACARPEL ([place]) ARTHROPLASTY AND TENDON TRANSFER;  Surgeon: Iran Planas, MD;  Location: [facility];  Service: Orthopedics;  Laterality: Right;  . HEMORRHOID SURGERY     almost 40 yrs ago  . HYSTEROSCOPY W/D&C  08/23/2000   and resectoscopic myomectomy  . LUMBAR DISC SURGERY  03/16/2005  . LUMBAR EPIDURAL INJECTION    . PLANTAR FASCIA RELEASE Left 02/03/2010   and torn tendon  . TENDON TRANSFER Right 09/28/2015   Procedure: TENDON TRANSFER;  Surgeon: Iran Planas, MD;  Location: [facility];  Service: Orthopedics;  Laterality: Right;  . TONSILLECTOMY    . TUBAL LIGATION    . WRIST SURGERY     left, removal of cyst    OB History    No data available       Home Medications    Prior to Admission medications   Medication Sig Start Date End Date Taking? Authorizing Provider  GABAPENTIN PO Take by mouth.   Yes Historical Provider, MD  amLODipine (NORVASC) 5 MG tablet Take 5 mg by mouth daily.      Historical Provider, MD    aspirin 81 MG tablet Take 81 mg by mouth daily.    Historical Provider, MD  cholecalciferol (VITAMIN D) 1000 UNITS tablet Take 1,000 Units by mouth daily.      Historical Provider, MD  cycloSPORINE (RESTASIS) 0.05 % ophthalmic emulsion Place 1 drop into both eyes 2 (two) times daily.    Historical Provider, MD  docusate sodium (COLACE) 100 MG capsule Take 1 capsule (100 mg total) by mouth 2 (two) times daily. 09/28/15   Iran Planas, MD  Dulaglutide (TRULICITY) A999333 0000000 SOPN Inject 0.75 mg into the skin once a week.    Historical Provider, MD  empagliflozin (JARDIANCE) 10 MG TABS tablet Take 25 mg by mouth daily.  08/06/14   Historical Provider, MD  ibuprofen (ADVIL,MOTRIN) 800 MG tablet Take 800 mg by mouth 2 (two) times daily as needed for moderate pain.    Historical Provider, MD  loratadine (CLARITIN) 10 MG tablet Take 10 mg by mouth daily as needed for allergies.     Historical Provider, MD  Magnesium 250 MG TABS Take 1 tablet by mouth daily.    Historical Provider, MD  metFORMIN (GLUCOPHAGE-XR) 500 MG 24 hr tablet Take 1,000 mg by mouth daily with breakfast.    Historical Provider, MD  methocarbamol (ROBAXIN) 500 MG tablet Take 1 tablet (500 mg total) by mouth 4 (four) times daily. 09/28/15   Iran Planas, MD  Multiple Vitamin (MULTIVITAMIN) capsule Take 1 capsule by mouth daily.      Historical Provider, MD  omeprazole (PRILOSEC OTC) 20 MG tablet Take 1 tablet (20 mg total) by mouth daily. 05/25/11 08/14/14  Gatha Mayer, MD  omeprazole (PRILOSEC) 20 MG capsule 20 mg. 08/19/15   Historical Provider, MD  ondansetron (ZOFRAN) 4 MG tablet Take 1 tablet (4 mg total) by mouth every 8 (eight) hours as needed for nausea or vomiting. 09/28/15   Iran Planas, MD  oxyCODONE-acetaminophen (ROXICET) 5-325 MG tablet Take 1 tablet by mouth every 4 (four) hours as needed for severe pain. 09/28/15   Iran Planas, MD  rOPINIRole (REQUIP) 2 MG tablet Take 2 mg by mouth at bedtime.  06/26/15   Historical  Provider, MD  telmisartan-hydrochlorothiazide (MICARDIS HCT) 80-25 MG per tablet Take 1 tablet by mouth daily.      Historical Provider, MD    Family History Family History  Problem Relation Age of Onset  . Pancreatic cancer Father   . Colon cancer Neg Hx     Social History Social History  Substance Use Topics  . Smoking status: Never Smoker  . Smokeless tobacco: Never Used  . Alcohol use No     Allergies   Sitagliptin; Ace inhibitors; Betadine [povidone iodine]; Codeine; and Tetracyclines & related   Review of Systems Review of Systems  Constitutional: Negative for chills and fever.  HENT: Negative for facial swelling and trouble swallowing.   Respiratory: Negative for shortness of breath and wheezing.   Gastrointestinal: Negative  for vomiting.  Skin: Positive for rash.  All other systems reviewed and are negative.    Physical Exam Updated Vital Signs BP 154/67   Pulse 80   Temp 98.1 F (36.7 C) (Oral)   Resp 20   Ht 5\' 4"  (1.626 m)   Wt 190 lb (86.2 kg)   SpO2 99%   BMI 32.61 kg/m   Physical Exam  Constitutional: She is oriented to person, place, and time. She appears well-developed and well-nourished.  HENT:  Head: Normocephalic and atraumatic.  Mouth/Throat: Oropharynx is clear and moist.  No oral lesions, no oral swelling, normal phonation, no stridor, handling secretions well, airway patent  Eyes: Conjunctivae and EOM are normal. Pupils are equal, round, and reactive to light.  Neck: Normal range of motion.  Cardiovascular: Normal rate, regular rhythm and normal heart sounds.   Pulmonary/Chest: Effort normal and breath sounds normal.  Abdominal: Soft. Bowel sounds are normal.  Musculoskeletal: Normal range of motion.  Neurological: She is alert and oriented to person, place, and time.  Skin: Skin is warm and dry. Rash noted.  Fine maculopapular rash noted across trunk, back, thighs, and upper arms; pruritic with areas of excoriation noted; no  drainage; no signs of superimposed infection or cellulitis; no lesions on palms soles  Psychiatric: She has a normal mood and affect.  Nursing note and vitals reviewed.    ED Treatments / Results  DIAGNOSTIC STUDIES: Oxygen Saturation is 99% on RA, normal by my interpretation.    COORDINATION OF CARE: 4:44 PM- Pt advised of plan for treatment and pt agrees. Pt will receive Pepcid and solu-medrol.    Labs (all labs ordered are listed, but only abnormal results are displayed) Labs Reviewed - No data to display  EKG  EKG Interpretation None       Radiology No results found.  Procedures Procedures (including critical care time)  Medications Ordered in ED Medications - No data to display   Initial Impression / Assessment and Plan / ED Course  Quincy Carnes, PA-C has reviewed the triage vital signs and the nursing notes.  Pertinent labs & imaging results that were available during my care of the patient were reviewed by me and considered in my medical decision making (see chart for details).  Clinical Course    70 year old female here with fine maculopapular rash across her trunk, back, thighs, and upper arms. Rash is pruritic with areas of excoriation noted. There are no signs of superimposed infection or cellulitis. She is hemodynamically stable without any oral swelling or airway compromise. Uncertain etiology, question of bath crystals vs unknown.  Given that she is not had any improvement with topical steroids, will switch to systemic and add Pepcid. She will continue Benadryl at home. She'll follow-up with her primary care doctor for any ongoing issues.  Discussed plan with patient, she acknowledged understanding and agreed with plan of care.  Return precautions given for new or worsening symptoms.  Final Clinical Impressions(s) / ED Diagnoses   Final diagnoses:  Rash    New Prescriptions New Prescriptions   FAMOTIDINE (PEPCID) 20 MG TABLET    Take 1 tablet (20 mg  total) by mouth 2 (two) times daily.   PREDNISONE (DELTASONE) 20 MG TABLET    Take 40 mg by mouth daily for 3 days, then 20mg  by mouth daily for 3 days, then 10mg  daily for 3 days   I personally performed the services described in this documentation, which was scribed in my presence. The recorded  information has been reviewed and is accurate.   Larene Pickett, PA-C 07/15/16 1733    Virgel Manifold, MD 07/19/16 [phone]

## 2016-07-21 ENCOUNTER — Ambulatory Visit
Admission: RE | Admit: 2016-07-21 | Discharge: 2016-07-21 | Disposition: A | Discharge status: Home / Self Care | Payer: Medicare Other | Source: Ambulatory Visit | Attending: Family Medicine | Admitting: Family Medicine

## 2016-07-21 DIAGNOSIS — Z1231 Encounter for screening mammogram for malignant neoplasm of breast: Secondary | ICD-10-CM

## 2017-07-27 ENCOUNTER — Other Ambulatory Visit: Payer: Self-pay | Admitting: Family Medicine

## 2017-07-27 DIAGNOSIS — Z1231 Encounter for screening mammogram for malignant neoplasm of breast: Secondary | ICD-10-CM

## 2017-08-04 ENCOUNTER — Ambulatory Visit
Admission: RE | Admit: 2017-08-04 | Discharge: 2017-08-04 | Disposition: A | Discharge status: Home / Self Care | Payer: Medicare Other | Source: Ambulatory Visit | Attending: Family Medicine | Admitting: Family Medicine

## 2017-08-04 DIAGNOSIS — Z1231 Encounter for screening mammogram for malignant neoplasm of breast: Secondary | ICD-10-CM

## 2017-09-05 DIAGNOSIS — G8929 Other chronic pain: Secondary | ICD-10-CM | POA: Insufficient documentation

## 2017-09-05 DIAGNOSIS — M545 Low back pain, unspecified: Secondary | ICD-10-CM | POA: Insufficient documentation

## 2017-09-05 DIAGNOSIS — M5416 Radiculopathy, lumbar region: Secondary | ICD-10-CM | POA: Diagnosis not present

## 2017-09-05 DIAGNOSIS — T819XXA Unspecified complication of procedure, initial encounter: Secondary | ICD-10-CM | POA: Insufficient documentation

## 2017-09-05 DIAGNOSIS — M47816 Spondylosis without myelopathy or radiculopathy, lumbar region: Secondary | ICD-10-CM | POA: Diagnosis not present

## 2017-09-12 DIAGNOSIS — I10 Essential (primary) hypertension: Secondary | ICD-10-CM | POA: Diagnosis not present

## 2017-09-12 DIAGNOSIS — J328 Other chronic sinusitis: Secondary | ICD-10-CM | POA: Diagnosis not present

## 2017-09-14 DIAGNOSIS — M5416 Radiculopathy, lumbar region: Secondary | ICD-10-CM | POA: Diagnosis not present

## 2017-09-15 DIAGNOSIS — J0140 Acute pansinusitis, unspecified: Secondary | ICD-10-CM | POA: Diagnosis not present

## 2017-09-15 DIAGNOSIS — J324 Chronic pansinusitis: Secondary | ICD-10-CM | POA: Diagnosis not present

## 2017-10-04 DIAGNOSIS — J324 Chronic pansinusitis: Secondary | ICD-10-CM | POA: Diagnosis not present

## 2017-10-06 DIAGNOSIS — H04123 Dry eye syndrome of bilateral lacrimal glands: Secondary | ICD-10-CM | POA: Diagnosis not present

## 2017-10-06 DIAGNOSIS — H40033 Anatomical narrow angle, bilateral: Secondary | ICD-10-CM | POA: Diagnosis not present

## 2017-10-16 DIAGNOSIS — M25511 Pain in right shoulder: Secondary | ICD-10-CM | POA: Diagnosis not present

## 2017-10-16 DIAGNOSIS — M5412 Radiculopathy, cervical region: Secondary | ICD-10-CM | POA: Diagnosis not present

## 2017-10-16 DIAGNOSIS — M5416 Radiculopathy, lumbar region: Secondary | ICD-10-CM | POA: Diagnosis not present

## 2017-10-18 DIAGNOSIS — M5136 Other intervertebral disc degeneration, lumbar region: Secondary | ICD-10-CM | POA: Diagnosis not present

## 2017-10-24 DIAGNOSIS — M542 Cervicalgia: Secondary | ICD-10-CM | POA: Diagnosis not present

## 2017-10-26 DIAGNOSIS — M5416 Radiculopathy, lumbar region: Secondary | ICD-10-CM | POA: Diagnosis not present

## 2017-10-27 DIAGNOSIS — M542 Cervicalgia: Secondary | ICD-10-CM | POA: Diagnosis not present

## 2017-10-31 DIAGNOSIS — M542 Cervicalgia: Secondary | ICD-10-CM | POA: Diagnosis not present

## 2017-11-02 DIAGNOSIS — M5136 Other intervertebral disc degeneration, lumbar region: Secondary | ICD-10-CM | POA: Diagnosis not present

## 2017-11-03 DIAGNOSIS — M542 Cervicalgia: Secondary | ICD-10-CM | POA: Diagnosis not present

## 2017-11-25 DIAGNOSIS — M5416 Radiculopathy, lumbar region: Secondary | ICD-10-CM | POA: Diagnosis not present

## 2017-12-20 DIAGNOSIS — M9903 Segmental and somatic dysfunction of lumbar region: Secondary | ICD-10-CM | POA: Diagnosis not present

## 2017-12-20 DIAGNOSIS — M9905 Segmental and somatic dysfunction of pelvic region: Secondary | ICD-10-CM | POA: Diagnosis not present

## 2017-12-20 DIAGNOSIS — M9904 Segmental and somatic dysfunction of sacral region: Secondary | ICD-10-CM | POA: Diagnosis not present

## 2017-12-20 DIAGNOSIS — M5431 Sciatica, right side: Secondary | ICD-10-CM | POA: Diagnosis not present

## 2017-12-21 DIAGNOSIS — M9904 Segmental and somatic dysfunction of sacral region: Secondary | ICD-10-CM | POA: Diagnosis not present

## 2017-12-21 DIAGNOSIS — M9903 Segmental and somatic dysfunction of lumbar region: Secondary | ICD-10-CM | POA: Diagnosis not present

## 2017-12-21 DIAGNOSIS — M5431 Sciatica, right side: Secondary | ICD-10-CM | POA: Diagnosis not present

## 2017-12-21 DIAGNOSIS — M9905 Segmental and somatic dysfunction of pelvic region: Secondary | ICD-10-CM | POA: Diagnosis not present

## 2017-12-26 DIAGNOSIS — M5431 Sciatica, right side: Secondary | ICD-10-CM | POA: Diagnosis not present

## 2017-12-26 DIAGNOSIS — M9903 Segmental and somatic dysfunction of lumbar region: Secondary | ICD-10-CM | POA: Diagnosis not present

## 2017-12-26 DIAGNOSIS — M9905 Segmental and somatic dysfunction of pelvic region: Secondary | ICD-10-CM | POA: Diagnosis not present

## 2017-12-26 DIAGNOSIS — M9904 Segmental and somatic dysfunction of sacral region: Secondary | ICD-10-CM | POA: Diagnosis not present

## 2017-12-27 DIAGNOSIS — M9903 Segmental and somatic dysfunction of lumbar region: Secondary | ICD-10-CM | POA: Diagnosis not present

## 2017-12-27 DIAGNOSIS — M5431 Sciatica, right side: Secondary | ICD-10-CM | POA: Diagnosis not present

## 2017-12-27 DIAGNOSIS — M9905 Segmental and somatic dysfunction of pelvic region: Secondary | ICD-10-CM | POA: Diagnosis not present

## 2017-12-27 DIAGNOSIS — M9904 Segmental and somatic dysfunction of sacral region: Secondary | ICD-10-CM | POA: Diagnosis not present

## 2017-12-28 DIAGNOSIS — M9903 Segmental and somatic dysfunction of lumbar region: Secondary | ICD-10-CM | POA: Diagnosis not present

## 2017-12-28 DIAGNOSIS — M9905 Segmental and somatic dysfunction of pelvic region: Secondary | ICD-10-CM | POA: Diagnosis not present

## 2017-12-28 DIAGNOSIS — M9904 Segmental and somatic dysfunction of sacral region: Secondary | ICD-10-CM | POA: Diagnosis not present

## 2017-12-28 DIAGNOSIS — M5431 Sciatica, right side: Secondary | ICD-10-CM | POA: Diagnosis not present

## 2018-01-01 DIAGNOSIS — M9904 Segmental and somatic dysfunction of sacral region: Secondary | ICD-10-CM | POA: Diagnosis not present

## 2018-01-01 DIAGNOSIS — M5431 Sciatica, right side: Secondary | ICD-10-CM | POA: Diagnosis not present

## 2018-01-01 DIAGNOSIS — M9903 Segmental and somatic dysfunction of lumbar region: Secondary | ICD-10-CM | POA: Diagnosis not present

## 2018-01-01 DIAGNOSIS — M9905 Segmental and somatic dysfunction of pelvic region: Secondary | ICD-10-CM | POA: Diagnosis not present

## 2018-01-03 DIAGNOSIS — M9905 Segmental and somatic dysfunction of pelvic region: Secondary | ICD-10-CM | POA: Diagnosis not present

## 2018-01-03 DIAGNOSIS — M5431 Sciatica, right side: Secondary | ICD-10-CM | POA: Diagnosis not present

## 2018-01-03 DIAGNOSIS — M9904 Segmental and somatic dysfunction of sacral region: Secondary | ICD-10-CM | POA: Diagnosis not present

## 2018-01-03 DIAGNOSIS — M9903 Segmental and somatic dysfunction of lumbar region: Secondary | ICD-10-CM | POA: Diagnosis not present

## 2018-01-08 DIAGNOSIS — M9905 Segmental and somatic dysfunction of pelvic region: Secondary | ICD-10-CM | POA: Diagnosis not present

## 2018-01-08 DIAGNOSIS — M9904 Segmental and somatic dysfunction of sacral region: Secondary | ICD-10-CM | POA: Diagnosis not present

## 2018-01-08 DIAGNOSIS — M9903 Segmental and somatic dysfunction of lumbar region: Secondary | ICD-10-CM | POA: Diagnosis not present

## 2018-01-08 DIAGNOSIS — M5431 Sciatica, right side: Secondary | ICD-10-CM | POA: Diagnosis not present

## 2018-01-17 DIAGNOSIS — E119 Type 2 diabetes mellitus without complications: Secondary | ICD-10-CM | POA: Diagnosis not present

## 2018-01-17 DIAGNOSIS — E1159 Type 2 diabetes mellitus with other circulatory complications: Secondary | ICD-10-CM | POA: Diagnosis not present

## 2018-01-17 DIAGNOSIS — J0100 Acute maxillary sinusitis, unspecified: Secondary | ICD-10-CM | POA: Diagnosis not present

## 2018-01-17 DIAGNOSIS — I10 Essential (primary) hypertension: Secondary | ICD-10-CM | POA: Diagnosis not present

## 2018-04-19 DIAGNOSIS — I10 Essential (primary) hypertension: Secondary | ICD-10-CM | POA: Diagnosis not present

## 2018-04-19 DIAGNOSIS — E119 Type 2 diabetes mellitus without complications: Secondary | ICD-10-CM | POA: Diagnosis not present

## 2018-04-19 DIAGNOSIS — Z0000 Encounter for general adult medical examination without abnormal findings: Secondary | ICD-10-CM | POA: Diagnosis not present

## 2018-04-19 DIAGNOSIS — Z23 Encounter for immunization: Secondary | ICD-10-CM | POA: Diagnosis not present

## 2018-04-19 DIAGNOSIS — E1159 Type 2 diabetes mellitus with other circulatory complications: Secondary | ICD-10-CM | POA: Diagnosis not present

## 2018-04-30 DIAGNOSIS — I872 Venous insufficiency (chronic) (peripheral): Secondary | ICD-10-CM | POA: Diagnosis not present

## 2018-07-04 ENCOUNTER — Other Ambulatory Visit: Payer: Self-pay

## 2018-07-04 ENCOUNTER — Emergency Department (HOSPITAL_COMMUNITY): Payer: Medicare Other

## 2018-07-04 ENCOUNTER — Emergency Department (HOSPITAL_COMMUNITY)
Admission: EM | Admit: 2018-07-04 | Discharge: 2018-07-04 | Discharge status: Against Medical Advice | Payer: Medicare Other | Attending: Emergency Medicine | Admitting: Emergency Medicine

## 2018-07-04 ENCOUNTER — Encounter (HOSPITAL_COMMUNITY): Payer: Self-pay

## 2018-07-04 DIAGNOSIS — Z5320 Procedure and treatment not carried out because of patient's decision for unspecified reasons: Secondary | ICD-10-CM | POA: Insufficient documentation

## 2018-07-04 DIAGNOSIS — E119 Type 2 diabetes mellitus without complications: Secondary | ICD-10-CM | POA: Insufficient documentation

## 2018-07-04 DIAGNOSIS — I160 Hypertensive urgency: Secondary | ICD-10-CM

## 2018-07-04 DIAGNOSIS — R0789 Other chest pain: Secondary | ICD-10-CM | POA: Insufficient documentation

## 2018-07-04 DIAGNOSIS — Z7984 Long term (current) use of oral hypoglycemic drugs: Secondary | ICD-10-CM | POA: Diagnosis not present

## 2018-07-04 DIAGNOSIS — I10 Essential (primary) hypertension: Secondary | ICD-10-CM | POA: Diagnosis not present

## 2018-07-04 DIAGNOSIS — Z79899 Other long term (current) drug therapy: Secondary | ICD-10-CM | POA: Diagnosis not present

## 2018-07-04 DIAGNOSIS — R079 Chest pain, unspecified: Secondary | ICD-10-CM | POA: Diagnosis not present

## 2018-07-04 LAB — CBC
HCT: 45.4 % (ref 36.0–46.0)
Hemoglobin: 14.3 g/dL (ref 12.0–15.0)
MCH: 28.6 pg (ref 26.0–34.0)
MCHC: 31.5 g/dL (ref 30.0–36.0)
MCV: 90.8 fL (ref 80.0–100.0)
Platelets: 265 10*3/uL (ref 150–400)
RBC: 5 MIL/uL (ref 3.87–5.11)
RDW: 13.5 % (ref 11.5–15.5)
WBC: 8 10*3/uL (ref 4.0–10.5)
nRBC: 0 % (ref 0.0–0.2)

## 2018-07-04 LAB — BASIC METABOLIC PANEL
Anion gap: 9 (ref 5–15)
BUN: 22 mg/dL (ref 8–23)
CO2: 25 mmol/L (ref 22–32)
Calcium: 9.8 mg/dL (ref 8.9–10.3)
Chloride: 107 mmol/L (ref 98–111)
Creatinine, Ser: 1.13 mg/dL — ABNORMAL HIGH (ref 0.44–1.00)
GFR calc Af Amer: 56 mL/min — ABNORMAL LOW (ref >60)
GFR calc non Af Amer: 49 mL/min — ABNORMAL LOW (ref >60)
Glucose, Bld: 105 mg/dL — ABNORMAL HIGH (ref 70–99)
Potassium: 3.7 mmol/L (ref 3.5–5.1)
Sodium: 141 mmol/L (ref 135–145)

## 2018-07-04 LAB — I-STAT CG4 LACTIC ACID, ED: Lactic Acid, Venous: 1.43 mmol/L (ref 0.5–1.9)

## 2018-07-04 NOTE — ED Triage Notes (Addendum)
Pt. Took her BP today and it was elevated.  She is having chest tightness and Headache.  Symptoms began today.  Skin is p/w/d,   Chest tightness is non-radiating.  Pt. Denies any n/v   ECG completed in triage.   Pt. s BP medications have been changed in the last month. She also reports having swelling to her lower legs.  Wears compression stockings.  BP 230/100 Lt. Arm and Rt. Arm 218/98 at home

## 2018-07-04 NOTE — ED Provider Notes (Signed)
[facility] Provider Note   CSN: [id number] Arrival date & time: 07/04/18  1802     History   Chief Complaint Chief Complaint  Patient presents with  . Chest Pain  . Hypertension    HPI [name] is a 72 y.o. female.  HPI  72 year old female comes in with chief complaint of elevated blood pressure. Patient has history of diabetes, hypertension.  Patient reports that she was checking her husband's blood pressure and decided to check her blood pressure as well to cross verify accuracy.  Initially the blood pressure machine was giving an erroneous results.  She called her granddaughter who is a paramedic and she checked her blood pressure and noted that it was over 712 systolic.  She advised patient to come to the ER.  Patient denies any associated chest pain, shortness of breath, new headache, focal numbness-weakness.  She reports that she has had a headache off and on for the last 2 to 3 weeks.   Patient has been taking her blood pressure medicines as prescribed.  She reports that her amlodipine was discontinued few weeks back after she started having leg swelling.  Past Medical History:  Diagnosis Date  . Arthritis   . Chronic back pain    buldging disc,scoliosis,arthritis  . Diabetes mellitus without complication ([place])    takes Trulicity,Jardiance,and Metformin daily.Average fasting blood sugar runs around130  . GERD (gastroesophageal reflux disease)    takes Omeprazole daily  . History of bronchitis > 8 yrs ago  . History of shingles   . HTN (hypertension)    takes Amlodipine and Micardis daily  . Hx of colonic polyps    benign  . Internal and external hemorrhoids without complication   . Joint pain   . Mitral valve prolapse   . Nocturia   . PONV (postoperative nausea and vomiting)    when gets injections in joints gets hives.Betadine rash  . Restless leg syndrome    takes Requip at bedtime  . Seasonal allergies    takes Claritin daily as needed  . Urinary frequency   . Uterine fibroid     Patient Active Problem List   Diagnosis Date Noted  . GERD (gastroesophageal reflux disease) 05/25/2011  . Hemorrhoids, internal, with bleeding 05/25/2011  . Obesity 05/25/2011    Past Surgical History:  Procedure Laterality Date  . BUNIONECTOMY Bilateral   . COLONOSCOPY  07/12/2004   diverticulosis, internal and external hemorrhoids  . COLONOSCOPY    . FINGER ARTHROSCOPY WITH CARPOMETACARPEL [facility]) ARTHROPLASTY Right 09/28/2015   Procedure: RIGHT THUMB TRAPEZIUM EXCISION WITH CARPOMETACARPEL ([place]) ARTHROPLASTY AND TENDON TRANSFER;  Surgeon: Iran Planas, MD;  Location: [place];  Service: Orthopedics;  Laterality: Right;  . HEMORRHOID SURGERY     almost 40 yrs ago  . HYSTEROSCOPY W/D&C  08/23/2000   and resectoscopic myomectomy  . LUMBAR DISC SURGERY  03/16/2005  . LUMBAR EPIDURAL INJECTION    . PLANTAR FASCIA RELEASE Left 02/03/2010   and torn tendon  . TENDON TRANSFER Right 09/28/2015   Procedure: TENDON TRANSFER;  Surgeon: Iran Planas, MD;  Location: [place];  Service: Orthopedics;  Laterality: Right;  . TONSILLECTOMY    . TUBAL LIGATION    . WRIST SURGERY     left, removal of cyst     OB History   None      Home Medications    Prior to Admission medications   Medication Sig Start Date End Date Taking? Authorizing Provider  amLODipine (NORVASC) 5 MG tablet Take 5 mg by mouth daily.      [provider]  aspirin 81 MG tablet Take 81 mg by mouth daily.    [provider]  cholecalciferol (VITAMIN D) 1000 UNITS tablet Take 1,000 Units by mouth daily.      [provider]  cycloSPORINE (RESTASIS) 0.05 % ophthalmic emulsion Place 1 drop into both eyes 2 (two) times daily.    [provider]  docusate sodium (COLACE) 100 MG capsule Take 1 capsule (100 mg total) by mouth 2 (two) times daily. 09/28/15   Iran Planas, MD  Dulaglutide (TRULICITY) 5.42 HC/6.2BJ SOPN Inject  0.75 mg into the skin once a week.    [provider]  empagliflozin (JARDIANCE) 10 MG TABS tablet Take 25 mg by mouth daily.  08/06/14   [provider]  famotidine (PEPCID) 20 MG tablet Take 1 tablet (20 mg total) by mouth 2 (two) times daily. 07/15/16   Larene Pickett, PA-C  GABAPENTIN PO Take by mouth.    [provider]  ibuprofen (ADVIL,MOTRIN) 800 MG tablet Take 800 mg by mouth 2 (two) times daily as needed for moderate pain.    [provider]  loratadine (CLARITIN) 10 MG tablet Take 10 mg by mouth daily as needed for allergies.     [provider]  Magnesium 250 MG TABS Take 1 tablet by mouth daily.    [provider]  metFORMIN (GLUCOPHAGE-XR) 500 MG 24 hr tablet Take 1,000 mg by mouth daily with breakfast.    [provider]  methocarbamol (ROBAXIN) 500 MG tablet Take 1 tablet (500 mg total) by mouth 4 (four) times daily. 09/28/15   Iran Planas, MD  Multiple Vitamin (MULTIVITAMIN) capsule Take 1 capsule by mouth daily.      [provider]  omeprazole (PRILOSEC OTC) 20 MG tablet Take 1 tablet (20 mg total) by mouth daily. 05/25/11 08/14/14  Gatha Mayer, MD  omeprazole (PRILOSEC) 20 MG capsule 20 mg. 08/19/15   [provider]  ondansetron (ZOFRAN) 4 MG tablet Take 1 tablet (4 mg total) by mouth every 8 (eight) hours as needed for nausea or vomiting. 09/28/15   Iran Planas, MD  oxyCODONE-acetaminophen (ROXICET) 5-325 MG tablet Take 1 tablet by mouth every 4 (four) hours as needed for severe pain. 09/28/15   Iran Planas, MD  predniSONE (DELTASONE) 20 MG tablet Take 40 mg by mouth daily for 3 days, then 20mg  by mouth daily for 3 days, then 10mg  daily for 3 days 07/15/16   Larene Pickett, PA-C  rOPINIRole (REQUIP) 2 MG tablet Take 2 mg by mouth at bedtime.  06/26/15   [provider]  telmisartan-hydrochlorothiazide (MICARDIS HCT) 80-25 MG per tablet Take 1 tablet by mouth daily.      [provider]    Family History Family History  Problem Relation Age of Onset  . Pancreatic cancer Father   . Colon cancer Neg Hx   . Breast cancer Neg Hx     Social History Social History   Tobacco Use  . Smoking status: Never Smoker  . Smokeless tobacco: Never Used  Substance Use Topics  . Alcohol use: No    Alcohol/week: 0.0 standard drinks  . Drug use: No     Allergies   Sitagliptin; Ace inhibitors; Betadine [povidone iodine]; Codeine; and Tetracyclines & related   Review of Systems Review of Systems  Constitutional: Negative for activity change.  Respiratory: Negative for  shortness of breath.   Cardiovascular: Negative for chest pain.  Neurological: Positive for headaches. Negative for dizziness, seizures and weakness.     Physical Exam Updated Vital Signs BP (!) 174/65   Pulse 78   Temp 98 F (36.7 C) (Oral)   Resp 20   Ht 5\' 5"  (1.651 m)   Wt 87.5 kg   SpO2 (!) 61%   BMI 32.12 kg/m   Physical Exam  Constitutional: She is oriented to person, place, and time. She appears well-developed.  HENT:  Head: Normocephalic and atraumatic.  Eyes: EOM are normal.  Neck: Normal range of motion. Neck supple.  Cardiovascular: Normal rate, intact distal pulses and normal pulses.  Pulmonary/Chest: Effort normal.  Abdominal: Bowel sounds are normal.  Musculoskeletal:       Right lower leg: She exhibits no edema.       Left lower leg: She exhibits no edema.  Neurological: She is alert and oriented to person, place, and time. No cranial nerve deficit.  Skin: Skin is warm and dry.  Nursing note and vitals reviewed.    ED Treatments / Results  Labs (all labs ordered are listed, but only abnormal results are displayed) Labs Reviewed  BASIC METABOLIC PANEL - Abnormal; Notable for the following components:      Result Value   Glucose, Bld 105 (*)    Creatinine, Ser 1.13 (*)    GFR calc non Af Amer 49 (*)    GFR calc Af Amer 56 (*)    All other components  within normal limits  CBC  I-STAT TROPONIN, ED  I-STAT CG4 LACTIC ACID, ED    EKG EKG Interpretation  Date/Time:  Wednesday July 04 2018 19:30:04 EST Ventricular Rate:  67 PR Interval:  170 QRS Duration: 86 QT Interval:  414 QTC Calculation: 437 R Axis:   42 Text Interpretation:  Sinus rhythm with occasional Premature ventricular complexes Nonspecific ST abnormality Abnormal ECG No acute changes Confirmed by Varney Biles ([id number]) on 07/04/2018 11:21:58 PM   Radiology Dg Chest 2 View  Result Date: 07/04/2018 CLINICAL DATA:  Chest pain EXAM: CHEST - 2 VIEW COMPARISON:  06/29/2009 FINDINGS: Normal heart size. Mild aortic atherosclerosis. No alveolar consolidation, edema, effusion pneumothorax. Mild diffuse interstitial prominence is noted of the lungs, nonspecific may reflect interstitial lung disease. Acute osseous abnormality. Degenerative changes are present along the dorsal spine. IMPRESSION: No active cardiopulmonary disease. Electronically Signed   By: Ashley Royalty M.D.   On: 07/04/2018 20:01    Procedures Procedures (including critical care time)  Medications Ordered in ED Medications - No data to display   Initial Impression / Assessment and Plan / ED Course  I have reviewed the triage vital signs and the nursing notes.  Pertinent labs & imaging results that were available during my care of the patient were reviewed by me and considered in my medical decision making (see chart for details).  Clinical Course as of Jul 06 31  Wed Jul 04, 2018  2324 Patient had eloped when I went in to assess her. I called the patient directly and discussed with her the lab findings.  I apologize for the delay and informed her that our computer system went down at 930, and therefore we were scrambling to get information on our sickest patients and to manage the sickest patients.  I informed her that as indicated to her earlier, she needs to call her PCP and if they are able to see  her promptly then not to  take any medication.  She should take amlodipine 5 mg only if the PCP is unable to get her in for more than a week. Patient expressed understanding the plan and was appreciated that we call her.  She was apologizing for what she felt was wasting our time, and I assured her that she was not wasting her time and apologized again for the delay in disposition.   [AN]    Clinical Course User Index [AN] Varney Biles, MD    72 year old female comes in with chief complaint of elevated blood pressure. Patient is noted to have BP in the 034J systolic during my evaluation.  Allegedly her SBP was over 200 at home.  Patient is not having any symptoms associated with this blood pressure that is consistent with endorgan damage.  Labs have been ordered to ensure her creatinine is not significantly elevated.  Patient had mentioned some chest discomfort when she arrived, therefore EKG and troponin has been ordered.  Currently however patient has no chest pain, shortness of breath and her neuro exam is nonfocal.  Anticipate discharge.  Patient reports that she is able to see her PCP usually the same day or next day, which is reassuring.  Since patient's PCP is able to get her in quickly we might not tweak any of her medications in the ED.  Final Clinical Impressions(s) / ED Diagnoses   Final diagnoses:  Hypertensive urgency    ED Discharge Orders    None       Varney Biles, MD 07/05/18 [phone]

## 2018-07-04 NOTE — ED Notes (Signed)
  Patient left AMA after stating she was unhappy with the wait time.  Did not see the patient leave so I could not talk her into staying.  Dr. Kathrynn Humble notified

## 2018-07-05 DIAGNOSIS — I10 Essential (primary) hypertension: Secondary | ICD-10-CM | POA: Diagnosis not present

## 2018-07-05 DIAGNOSIS — E1159 Type 2 diabetes mellitus with other circulatory complications: Secondary | ICD-10-CM | POA: Diagnosis not present

## 2018-07-06 ENCOUNTER — Other Ambulatory Visit: Payer: Self-pay | Admitting: Family Medicine

## 2018-07-06 DIAGNOSIS — Z1231 Encounter for screening mammogram for malignant neoplasm of breast: Secondary | ICD-10-CM

## 2018-07-12 DIAGNOSIS — L03031 Cellulitis of right toe: Secondary | ICD-10-CM | POA: Diagnosis not present

## 2018-07-12 DIAGNOSIS — E119 Type 2 diabetes mellitus without complications: Secondary | ICD-10-CM | POA: Diagnosis not present

## 2018-07-12 DIAGNOSIS — I10 Essential (primary) hypertension: Secondary | ICD-10-CM | POA: Diagnosis not present

## 2018-07-19 DIAGNOSIS — E119 Type 2 diabetes mellitus without complications: Secondary | ICD-10-CM | POA: Diagnosis not present

## 2018-07-19 DIAGNOSIS — I10 Essential (primary) hypertension: Secondary | ICD-10-CM | POA: Diagnosis not present

## 2018-07-19 DIAGNOSIS — Z1159 Encounter for screening for other viral diseases: Secondary | ICD-10-CM | POA: Diagnosis not present

## 2018-07-19 DIAGNOSIS — K219 Gastro-esophageal reflux disease without esophagitis: Secondary | ICD-10-CM | POA: Diagnosis not present

## 2018-07-19 DIAGNOSIS — L03031 Cellulitis of right toe: Secondary | ICD-10-CM | POA: Diagnosis not present

## 2018-07-19 DIAGNOSIS — L0390 Cellulitis, unspecified: Secondary | ICD-10-CM | POA: Diagnosis not present

## 2018-07-19 DIAGNOSIS — E1159 Type 2 diabetes mellitus with other circulatory complications: Secondary | ICD-10-CM | POA: Diagnosis not present

## 2018-07-23 DIAGNOSIS — M9904 Segmental and somatic dysfunction of sacral region: Secondary | ICD-10-CM | POA: Diagnosis not present

## 2018-07-23 DIAGNOSIS — M9903 Segmental and somatic dysfunction of lumbar region: Secondary | ICD-10-CM | POA: Diagnosis not present

## 2018-07-23 DIAGNOSIS — M9905 Segmental and somatic dysfunction of pelvic region: Secondary | ICD-10-CM | POA: Diagnosis not present

## 2018-07-23 DIAGNOSIS — M5431 Sciatica, right side: Secondary | ICD-10-CM | POA: Diagnosis not present

## 2018-07-26 DIAGNOSIS — M9905 Segmental and somatic dysfunction of pelvic region: Secondary | ICD-10-CM | POA: Diagnosis not present

## 2018-07-26 DIAGNOSIS — M9904 Segmental and somatic dysfunction of sacral region: Secondary | ICD-10-CM | POA: Diagnosis not present

## 2018-07-26 DIAGNOSIS — M9903 Segmental and somatic dysfunction of lumbar region: Secondary | ICD-10-CM | POA: Diagnosis not present

## 2018-07-26 DIAGNOSIS — M5431 Sciatica, right side: Secondary | ICD-10-CM | POA: Diagnosis not present

## 2018-07-30 DIAGNOSIS — M9903 Segmental and somatic dysfunction of lumbar region: Secondary | ICD-10-CM | POA: Diagnosis not present

## 2018-07-30 DIAGNOSIS — M9904 Segmental and somatic dysfunction of sacral region: Secondary | ICD-10-CM | POA: Diagnosis not present

## 2018-07-30 DIAGNOSIS — M9905 Segmental and somatic dysfunction of pelvic region: Secondary | ICD-10-CM | POA: Diagnosis not present

## 2018-07-30 DIAGNOSIS — M5431 Sciatica, right side: Secondary | ICD-10-CM | POA: Diagnosis not present

## 2018-08-14 ENCOUNTER — Ambulatory Visit
Admission: RE | Admit: 2018-08-14 | Discharge: 2018-08-14 | Disposition: A | Discharge status: Home / Self Care | Payer: Medicare Other | Source: Ambulatory Visit | Attending: Family Medicine | Admitting: Family Medicine

## 2018-08-14 DIAGNOSIS — Z1231 Encounter for screening mammogram for malignant neoplasm of breast: Secondary | ICD-10-CM

## 2018-11-05 DIAGNOSIS — M1712 Unilateral primary osteoarthritis, left knee: Secondary | ICD-10-CM | POA: Insufficient documentation

## 2019-05-14 ENCOUNTER — Other Ambulatory Visit: Payer: Self-pay | Admitting: Student

## 2019-05-14 DIAGNOSIS — G8929 Other chronic pain: Secondary | ICD-10-CM

## 2019-05-26 ENCOUNTER — Ambulatory Visit
Admission: RE | Admit: 2019-05-26 | Discharge: 2019-05-26 | Disposition: A | Discharge status: Home / Self Care | Payer: Medicare Other | Source: Ambulatory Visit | Attending: Student | Admitting: Student

## 2019-05-26 ENCOUNTER — Other Ambulatory Visit: Payer: Self-pay

## 2019-05-26 DIAGNOSIS — M25562 Pain in left knee: Secondary | ICD-10-CM

## 2019-05-26 DIAGNOSIS — G8929 Other chronic pain: Secondary | ICD-10-CM

## 2019-07-19 ENCOUNTER — Other Ambulatory Visit: Payer: Self-pay | Admitting: Family Medicine

## 2019-07-19 DIAGNOSIS — Z1231 Encounter for screening mammogram for malignant neoplasm of breast: Secondary | ICD-10-CM

## 2019-08-22 DIAGNOSIS — Z789 Other specified health status: Secondary | ICD-10-CM | POA: Insufficient documentation

## 2019-09-05 ENCOUNTER — Ambulatory Visit
Admission: RE | Admit: 2019-09-05 | Discharge: 2019-09-05 | Disposition: A | Discharge status: Home / Self Care | Payer: Medicare Other | Source: Ambulatory Visit | Attending: Family Medicine | Admitting: Family Medicine

## 2019-09-05 ENCOUNTER — Other Ambulatory Visit: Payer: Self-pay

## 2019-09-05 DIAGNOSIS — Z1231 Encounter for screening mammogram for malignant neoplasm of breast: Secondary | ICD-10-CM

## 2020-04-30 DIAGNOSIS — M5030 Other cervical disc degeneration, unspecified cervical region: Secondary | ICD-10-CM | POA: Insufficient documentation

## 2020-06-16 DIAGNOSIS — Z888 Allergy status to other drugs, medicaments and biological substances status: Secondary | ICD-10-CM | POA: Insufficient documentation

## 2020-07-29 DIAGNOSIS — M19012 Primary osteoarthritis, left shoulder: Secondary | ICD-10-CM | POA: Insufficient documentation

## 2020-07-29 DIAGNOSIS — M19019 Primary osteoarthritis, unspecified shoulder: Secondary | ICD-10-CM | POA: Insufficient documentation

## 2020-08-03 ENCOUNTER — Other Ambulatory Visit: Payer: Self-pay

## 2020-08-03 ENCOUNTER — Encounter (HOSPITAL_COMMUNITY): Payer: Self-pay | Admitting: Emergency Medicine

## 2020-08-03 ENCOUNTER — Emergency Department (HOSPITAL_COMMUNITY): Payer: Medicare Other

## 2020-08-03 ENCOUNTER — Inpatient Hospital Stay (HOSPITAL_COMMUNITY)
Admission: EM | Admit: 2020-08-03 | Discharge: 2020-08-06 | DRG: 177 | Disposition: A | Discharge status: Home / Self Care | Payer: Medicare Other | Attending: Family Medicine | Admitting: Family Medicine

## 2020-08-03 DIAGNOSIS — R778 Other specified abnormalities of plasma proteins: Secondary | ICD-10-CM | POA: Diagnosis present

## 2020-08-03 DIAGNOSIS — Z881 Allergy status to other antibiotic agents status: Secondary | ICD-10-CM

## 2020-08-03 DIAGNOSIS — E669 Obesity, unspecified: Secondary | ICD-10-CM | POA: Diagnosis present

## 2020-08-03 DIAGNOSIS — K219 Gastro-esophageal reflux disease without esophagitis: Secondary | ICD-10-CM | POA: Diagnosis present

## 2020-08-03 DIAGNOSIS — E1165 Type 2 diabetes mellitus with hyperglycemia: Secondary | ICD-10-CM | POA: Diagnosis present

## 2020-08-03 DIAGNOSIS — Z8719 Personal history of other diseases of the digestive system: Secondary | ICD-10-CM | POA: Diagnosis not present

## 2020-08-03 DIAGNOSIS — Z7984 Long term (current) use of oral hypoglycemic drugs: Secondary | ICD-10-CM | POA: Diagnosis not present

## 2020-08-03 DIAGNOSIS — T380X5A Adverse effect of glucocorticoids and synthetic analogues, initial encounter: Secondary | ICD-10-CM | POA: Diagnosis not present

## 2020-08-03 DIAGNOSIS — Z800 Family history of malignant neoplasm of digestive organs: Secondary | ICD-10-CM | POA: Diagnosis not present

## 2020-08-03 DIAGNOSIS — Z888 Allergy status to other drugs, medicaments and biological substances status: Secondary | ICD-10-CM | POA: Diagnosis not present

## 2020-08-03 DIAGNOSIS — Z7982 Long term (current) use of aspirin: Secondary | ICD-10-CM

## 2020-08-03 DIAGNOSIS — Z6832 Body mass index (BMI) 32.0-32.9, adult: Secondary | ICD-10-CM

## 2020-08-03 DIAGNOSIS — Z885 Allergy status to narcotic agent status: Secondary | ICD-10-CM

## 2020-08-03 DIAGNOSIS — Z9109 Other allergy status, other than to drugs and biological substances: Secondary | ICD-10-CM

## 2020-08-03 DIAGNOSIS — Z79899 Other long term (current) drug therapy: Secondary | ICD-10-CM | POA: Diagnosis not present

## 2020-08-03 DIAGNOSIS — G8929 Other chronic pain: Secondary | ICD-10-CM | POA: Diagnosis present

## 2020-08-03 DIAGNOSIS — U071 COVID-19: Principal | ICD-10-CM | POA: Diagnosis present

## 2020-08-03 DIAGNOSIS — M1990 Unspecified osteoarthritis, unspecified site: Secondary | ICD-10-CM | POA: Diagnosis present

## 2020-08-03 DIAGNOSIS — M419 Scoliosis, unspecified: Secondary | ICD-10-CM | POA: Diagnosis present

## 2020-08-03 DIAGNOSIS — L539 Erythematous condition, unspecified: Secondary | ICD-10-CM | POA: Diagnosis not present

## 2020-08-03 DIAGNOSIS — M549 Dorsalgia, unspecified: Secondary | ICD-10-CM | POA: Diagnosis present

## 2020-08-03 DIAGNOSIS — Z283 Underimmunization status: Secondary | ICD-10-CM | POA: Diagnosis not present

## 2020-08-03 DIAGNOSIS — J9601 Acute respiratory failure with hypoxia: Secondary | ICD-10-CM | POA: Diagnosis present

## 2020-08-03 DIAGNOSIS — J1282 Pneumonia due to coronavirus disease 2019: Secondary | ICD-10-CM | POA: Diagnosis present

## 2020-08-03 DIAGNOSIS — I429 Cardiomyopathy, unspecified: Secondary | ICD-10-CM | POA: Diagnosis present

## 2020-08-03 DIAGNOSIS — I340 Nonrheumatic mitral (valve) insufficiency: Secondary | ICD-10-CM | POA: Diagnosis not present

## 2020-08-03 DIAGNOSIS — G2581 Restless legs syndrome: Secondary | ICD-10-CM | POA: Diagnosis present

## 2020-08-03 DIAGNOSIS — I10 Essential (primary) hypertension: Secondary | ICD-10-CM | POA: Diagnosis present

## 2020-08-03 DIAGNOSIS — I361 Nonrheumatic tricuspid (valve) insufficiency: Secondary | ICD-10-CM | POA: Diagnosis not present

## 2020-08-03 DIAGNOSIS — R9431 Abnormal electrocardiogram [ECG] [EKG]: Secondary | ICD-10-CM | POA: Diagnosis not present

## 2020-08-03 DIAGNOSIS — I5021 Acute systolic (congestive) heart failure: Secondary | ICD-10-CM | POA: Diagnosis not present

## 2020-08-03 LAB — COMPREHENSIVE METABOLIC PANEL
ALT: 28 U/L (ref 0–44)
AST: 26 U/L (ref 15–41)
Albumin: 4.2 g/dL (ref 3.5–5.0)
Alkaline Phosphatase: 107 U/L (ref 38–126)
Anion gap: 15 (ref 5–15)
BUN: 23 mg/dL (ref 8–23)
CO2: 22 mmol/L (ref 22–32)
Calcium: 9.9 mg/dL (ref 8.9–10.3)
Chloride: 101 mmol/L (ref 98–111)
Creatinine, Ser: 1.1 mg/dL — ABNORMAL HIGH (ref 0.44–1.00)
GFR, Estimated: 53 mL/min — ABNORMAL LOW (ref >60)
Glucose, Bld: 204 mg/dL — ABNORMAL HIGH (ref 70–99)
Potassium: 4 mmol/L (ref 3.5–5.1)
Sodium: 138 mmol/L (ref 135–145)
Total Bilirubin: 1 mg/dL (ref 0.3–1.2)
Total Protein: 7.4 g/dL (ref 6.5–8.1)

## 2020-08-03 LAB — C-REACTIVE PROTEIN: CRP: 1.6 mg/dL — ABNORMAL HIGH (ref <1.0)

## 2020-08-03 LAB — CBC WITH DIFFERENTIAL/PLATELET
Abs Immature Granulocytes: 0.06 10*3/uL (ref 0.00–0.07)
Basophils Absolute: 0.1 10*3/uL (ref 0.0–0.1)
Basophils Relative: 1 %
Eosinophils Absolute: 0.1 10*3/uL (ref 0.0–0.5)
Eosinophils Relative: 1 %
HCT: 40.1 % (ref 36.0–46.0)
Hemoglobin: 13.1 g/dL (ref 12.0–15.0)
Immature Granulocytes: 1 %
Lymphocytes Relative: 3 %
Lymphs Abs: 0.4 10*3/uL — ABNORMAL LOW (ref 0.7–4.0)
MCH: 29.7 pg (ref 26.0–34.0)
MCHC: 32.7 g/dL (ref 30.0–36.0)
MCV: 90.9 fL (ref 80.0–100.0)
Monocytes Absolute: 0.7 10*3/uL (ref 0.1–1.0)
Monocytes Relative: 6 %
Neutro Abs: 10.1 10*3/uL — ABNORMAL HIGH (ref 1.7–7.7)
Neutrophils Relative %: 88 %
Platelets: 292 10*3/uL (ref 150–400)
RBC: 4.41 MIL/uL (ref 3.87–5.11)
RDW: 13.8 % (ref 11.5–15.5)
WBC: 11.4 10*3/uL — ABNORMAL HIGH (ref 4.0–10.5)
nRBC: 0 % (ref 0.0–0.2)

## 2020-08-03 LAB — LACTIC ACID, PLASMA
Lactic Acid, Venous: 3 mmol/L (ref 0.5–1.9)
Lactic Acid, Venous: 1.4 mmol/L (ref 0.5–1.9)

## 2020-08-03 LAB — LACTATE DEHYDROGENASE: LDH: 222 U/L — ABNORMAL HIGH (ref 98–192)

## 2020-08-03 LAB — TROPONIN I (HIGH SENSITIVITY)
Troponin I (High Sensitivity): 65 ng/L — ABNORMAL HIGH (ref <18)
Troponin I (High Sensitivity): 66 ng/L — ABNORMAL HIGH (ref <18)

## 2020-08-03 LAB — POC SARS CORONAVIRUS 2 AG: SARS Coronavirus 2 Ag: POSITIVE — AB

## 2020-08-03 LAB — BRAIN NATRIURETIC PEPTIDE: B Natriuretic Peptide: 530.6 pg/mL — ABNORMAL HIGH (ref 0.0–100.0)

## 2020-08-03 LAB — FERRITIN: Ferritin: 48 ng/mL (ref 11–307)

## 2020-08-03 LAB — TSH: TSH: 2.701 u[IU]/mL (ref 0.350–4.500)

## 2020-08-03 LAB — TRIGLYCERIDES: Triglycerides: 122 mg/dL (ref <150)

## 2020-08-03 LAB — PROCALCITONIN: Procalcitonin: <0.1 ng/mL

## 2020-08-03 LAB — CBG MONITORING, ED
Glucose-Capillary: 252 mg/dL — ABNORMAL HIGH (ref 70–99)
Glucose-Capillary: 233 mg/dL — ABNORMAL HIGH (ref 70–99)

## 2020-08-03 LAB — HEMOGLOBIN A1C
Hgb A1c MFr Bld: 6.6 % — ABNORMAL HIGH (ref 4.8–5.6)
Mean Plasma Glucose: 142.72 mg/dL

## 2020-08-03 LAB — D-DIMER, QUANTITATIVE: D-Dimer, Quant: 2.5 ug/mL-FEU — ABNORMAL HIGH (ref 0.00–0.50)

## 2020-08-03 LAB — FIBRINOGEN: Fibrinogen: 440 mg/dL (ref 210–475)

## 2020-08-03 MED ORDER — ZOLPIDEM TARTRATE 5 MG PO TABS
5.0000 mg | ORAL_TABLET | Freq: Every evening | ORAL | Status: DC | PRN
  Administered 2020-08-05: 5 mg via ORAL
  Filled 2020-08-03: qty 1

## 2020-08-03 MED ORDER — DILTIAZEM HCL ER COATED BEADS 120 MG PO CP24
240.0000 mg | ORAL_CAPSULE | Freq: Every day | ORAL | Status: DC
  Administered 2020-08-04: 240 mg via ORAL
  Filled 2020-08-03: qty 2

## 2020-08-03 MED ORDER — PREGABALIN 50 MG PO CAPS
100.0000 mg | ORAL_CAPSULE | Freq: Two times a day (BID) | ORAL | Status: DC
  Administered 2020-08-03 – 2020-08-06 (×6): 100 mg via ORAL
  Filled 2020-08-03 (×6): qty 2

## 2020-08-03 MED ORDER — HYDRALAZINE HCL 20 MG/ML IJ SOLN: 10.0000 mg | Freq: Four times a day (QID) | INTRAMUSCULAR | Status: DC | PRN

## 2020-08-03 MED ORDER — SODIUM CHLORIDE 0.9 % IV SOLN
200.0000 mg | Freq: Once | INTRAVENOUS | Status: AC
  Administered 2020-08-03: 200 mg via INTRAVENOUS
  Filled 2020-08-03: qty 200

## 2020-08-03 MED ORDER — ENOXAPARIN SODIUM 40 MG/0.4ML ~~LOC~~ SOLN
40.0000 mg | SUBCUTANEOUS | Status: DC
  Administered 2020-08-03 – 2020-08-05 (×3): 40 mg via SUBCUTANEOUS
  Filled 2020-08-03 (×4): qty 0.4

## 2020-08-03 MED ORDER — MAGNESIUM OXIDE 400 (241.3 MG) MG PO TABS
400.0000 mg | ORAL_TABLET | Freq: Every day | ORAL | Status: DC
  Administered 2020-08-04 – 2020-08-06 (×3): 400 mg via ORAL
  Filled 2020-08-03 (×4): qty 1

## 2020-08-03 MED ORDER — HYDROCHLOROTHIAZIDE 12.5 MG PO CAPS: 12.5000 mg | ORAL_CAPSULE | Freq: Every day | ORAL | Status: DC

## 2020-08-03 MED ORDER — CYCLOSPORINE 0.05 % OP EMUL
1.0000 [drp] | Freq: Two times a day (BID) | OPHTHALMIC | Status: DC
  Administered 2020-08-03 – 2020-08-06 (×6): 1 [drp] via OPHTHALMIC
  Filled 2020-08-03 (×6): qty 1

## 2020-08-03 MED ORDER — IRBESARTAN 300 MG PO TABS
300.0000 mg | ORAL_TABLET | Freq: Every day | ORAL | Status: DC
  Filled 2020-08-03: qty 1

## 2020-08-03 MED ORDER — ALLOPURINOL 100 MG PO TABS
100.0000 mg | ORAL_TABLET | Freq: Every day | ORAL | Status: DC
  Administered 2020-08-04 – 2020-08-06 (×3): 100 mg via ORAL
  Filled 2020-08-03 (×3): qty 1

## 2020-08-03 MED ORDER — ADULT MULTIVITAMIN W/MINERALS CH
1.0000 | ORAL_TABLET | Freq: Every day | ORAL | Status: DC
  Administered 2020-08-04 – 2020-08-06 (×3): 1 via ORAL
  Filled 2020-08-03 (×4): qty 1

## 2020-08-03 MED ORDER — ZINC SULFATE 220 (50 ZN) MG PO CAPS
220.0000 mg | ORAL_CAPSULE | Freq: Every day | ORAL | Status: DC
  Administered 2020-08-03 – 2020-08-06 (×4): 220 mg via ORAL
  Filled 2020-08-03 (×4): qty 1

## 2020-08-03 MED ORDER — ROPINIROLE HCL 1 MG PO TABS
2.0000 mg | ORAL_TABLET | Freq: Every day | ORAL | Status: DC
  Administered 2020-08-03 – 2020-08-05 (×3): 2 mg via ORAL
  Filled 2020-08-03 (×4): qty 2

## 2020-08-03 MED ORDER — PANTOPRAZOLE SODIUM 40 MG IV SOLR
40.0000 mg | INTRAVENOUS | Status: DC
  Administered 2020-08-03 – 2020-08-05 (×3): 40 mg via INTRAVENOUS
  Filled 2020-08-03 (×3): qty 40

## 2020-08-03 MED ORDER — IRBESARTAN-HYDROCHLOROTHIAZIDE 300-12.5 MG PO TABS: 1.0000 | ORAL_TABLET | Freq: Every day | ORAL | Status: DC

## 2020-08-03 MED ORDER — ONDANSETRON HCL 4 MG/2ML IJ SOLN: 4.0000 mg | Freq: Four times a day (QID) | INTRAMUSCULAR | Status: DC | PRN

## 2020-08-03 MED ORDER — POLYETHYLENE GLYCOL 3350 17 G PO PACK: 17.0000 g | PACK | Freq: Every day | ORAL | Status: DC | PRN

## 2020-08-03 MED ORDER — DOCUSATE SODIUM 100 MG PO CAPS
100.0000 mg | ORAL_CAPSULE | Freq: Two times a day (BID) | ORAL | Status: DC
  Administered 2020-08-03 – 2020-08-06 (×6): 100 mg via ORAL
  Filled 2020-08-03 (×6): qty 1

## 2020-08-03 MED ORDER — INSULIN ASPART 100 UNIT/ML ~~LOC~~ SOLN
0.0000 [IU] | Freq: Every day | SUBCUTANEOUS | Status: DC
  Administered 2020-08-03: 3 [IU] via SUBCUTANEOUS
  Filled 2020-08-03: qty 0.05

## 2020-08-03 MED ORDER — INSULIN ASPART 100 UNIT/ML ~~LOC~~ SOLN
0.0000 [IU] | Freq: Three times a day (TID) | SUBCUTANEOUS | Status: DC
  Administered 2020-08-03: 4 [IU] via SUBCUTANEOUS
  Administered 2020-08-04: 5 [IU] via SUBCUTANEOUS
  Administered 2020-08-04 (×2): 3 [IU] via SUBCUTANEOUS
  Administered 2020-08-05 – 2020-08-06 (×2): 1 [IU] via SUBCUTANEOUS
  Filled 2020-08-03: qty 0.09

## 2020-08-03 MED ORDER — DEXAMETHASONE SODIUM PHOSPHATE 10 MG/ML IJ SOLN
6.0000 mg | Freq: Once | INTRAMUSCULAR | Status: AC
  Administered 2020-08-03: 6 mg via INTRAVENOUS
  Filled 2020-08-03: qty 1

## 2020-08-03 MED ORDER — SENNA 8.6 MG PO TABS
1.0000 | ORAL_TABLET | Freq: Two times a day (BID) | ORAL | Status: DC
  Administered 2020-08-03 – 2020-08-06 (×6): 8.6 mg via ORAL
  Filled 2020-08-03 (×6): qty 1

## 2020-08-03 MED ORDER — AMLODIPINE BESYLATE 5 MG PO TABS
5.0000 mg | ORAL_TABLET | Freq: Every day | ORAL | Status: DC
  Administered 2020-08-04 – 2020-08-06 (×3): 5 mg via ORAL
  Filled 2020-08-03 (×3): qty 1

## 2020-08-03 MED ORDER — DEXAMETHASONE SODIUM PHOSPHATE 4 MG/ML IJ SOLN
4.0000 mg | INTRAMUSCULAR | Status: DC
  Administered 2020-08-04: 4 mg via INTRAVENOUS
  Filled 2020-08-03: qty 1

## 2020-08-03 MED ORDER — ACETAMINOPHEN 650 MG RE SUPP: 650.0000 mg | Freq: Four times a day (QID) | RECTAL | Status: DC | PRN

## 2020-08-03 MED ORDER — DULAGLUTIDE 0.75 MG/0.5ML ~~LOC~~ SOAJ: 0.7500 mg | SUBCUTANEOUS | Status: DC

## 2020-08-03 MED ORDER — VITAMIN D 25 MCG (1000 UNIT) PO TABS
1000.0000 [IU] | ORAL_TABLET | Freq: Every day | ORAL | Status: DC
  Administered 2020-08-04 – 2020-08-06 (×3): 1000 [IU] via ORAL
  Filled 2020-08-03 (×3): qty 1

## 2020-08-03 MED ORDER — ONDANSETRON HCL 4 MG PO TABS: 4.0000 mg | ORAL_TABLET | Freq: Four times a day (QID) | ORAL | Status: DC | PRN

## 2020-08-03 MED ORDER — ADULT MULTIVITAMIN W/MINERALS CH
1.0000 | ORAL_TABLET | Freq: Every day | ORAL | Status: DC
  Administered 2020-08-03 – 2020-08-04 (×2): 1 via ORAL
  Filled 2020-08-03 (×2): qty 1

## 2020-08-03 MED ORDER — ALBUTEROL SULFATE (2.5 MG/3ML) 0.083% IN NEBU
2.5000 mg | INHALATION_SOLUTION | Freq: Four times a day (QID) | RESPIRATORY_TRACT | Status: DC
  Filled 2020-08-03: qty 3

## 2020-08-03 MED ORDER — ASPIRIN EC 81 MG PO TBEC
81.0000 mg | DELAYED_RELEASE_TABLET | Freq: Every day | ORAL | Status: DC
  Administered 2020-08-04 – 2020-08-06 (×3): 81 mg via ORAL
  Filled 2020-08-03 (×4): qty 1

## 2020-08-03 MED ORDER — ACETAMINOPHEN 325 MG PO TABS: 650.0000 mg | ORAL_TABLET | Freq: Four times a day (QID) | ORAL | Status: DC | PRN

## 2020-08-03 MED ORDER — SODIUM CHLORIDE 0.9 % IV SOLN
100.0000 mg | Freq: Every day | INTRAVENOUS | Status: DC
  Administered 2020-08-04 – 2020-08-06 (×3): 100 mg via INTRAVENOUS
  Filled 2020-08-03 (×3): qty 20

## 2020-08-03 MED ORDER — FLUTICASONE PROPIONATE 50 MCG/ACT NA SUSP
1.0000 | Freq: Every day | NASAL | Status: DC | PRN
  Filled 2020-08-03: qty 16

## 2020-08-03 MED ORDER — GLIPIZIDE 5 MG PO TABS
5.0000 mg | ORAL_TABLET | Freq: Two times a day (BID) | ORAL | Status: DC
  Administered 2020-08-03 – 2020-08-05 (×4): 5 mg via ORAL
  Filled 2020-08-03 (×5): qty 1

## 2020-08-03 MED ORDER — GUAIFENESIN-DM 100-10 MG/5ML PO SYRP: 5.0000 mL | ORAL_SOLUTION | Freq: Four times a day (QID) | ORAL | Status: DC | PRN

## 2020-08-03 NOTE — ED Notes (Signed)
Called for blood draw x1 with no answer.

## 2020-08-03 NOTE — ED Provider Notes (Signed)
[facility] DEPT Provider Note   CSN: [id number] Arrival date & time: 08/03/20  1130     History Chief Complaint  Patient presents with  . Chest Pain  . Shortness of Breath  . Cough    [name] is a 75 y.o. female.  75 year old female with prior medical history as detailed below presents for evaluation of shortness of breath.  Patient reports approximately 1-1/2 weeks of gradually progressive shortness of breath.  Patient reports subjective fevers and mild cough.  Patient reports that her symptoms are worse with exertion.  She denies chest pain.  She is unvaccinated for COVID-19.  Triage noted significant hypoxia with saturations in the mid 70's with ambulation on room air.    The history is provided by the patient and medical records.  Shortness of Breath Severity:  Moderate Onset quality:  Gradual Duration:  12 days Timing:  Constant Progression:  Worsening Chronicity:  New Relieved by:  Nothing Worsened by:  Nothing Ineffective treatments:  None tried Cough Associated symptoms: shortness of breath        Past Medical History:  Diagnosis Date  . Arthritis   . Chronic back pain    buldging disc,scoliosis,arthritis  . Diabetes mellitus without complication ([place])    takes Trulicity,Jardiance,and Metformin daily.Average fasting blood sugar runs around130  . GERD (gastroesophageal reflux disease)    takes Omeprazole daily  . History of bronchitis > 8 yrs ago  . History of shingles   . HTN (hypertension)    takes Amlodipine and Micardis daily  . Hx of colonic polyps    benign  . Internal and external hemorrhoids without complication   . Joint pain   . Mitral valve prolapse   . Nocturia   . PONV (postoperative nausea and vomiting)    when gets injections in joints gets hives.Betadine rash  . Restless leg syndrome    takes Requip at bedtime  . Seasonal allergies    takes Claritin daily as needed  . Urinary frequency   .  Uterine fibroid     Patient Active Problem List   Diagnosis Date Noted  . GERD (gastroesophageal reflux disease) 05/25/2011  . Hemorrhoids, internal, with bleeding 05/25/2011  . Obesity 05/25/2011    Past Surgical History:  Procedure Laterality Date  . BUNIONECTOMY Bilateral   . COLONOSCOPY  07/12/2004   diverticulosis, internal and external hemorrhoids  . COLONOSCOPY    . FINGER ARTHROSCOPY WITH CARPOMETACARPEL [facility]) ARTHROPLASTY Right 09/28/2015   Procedure: RIGHT THUMB TRAPEZIUM EXCISION WITH CARPOMETACARPEL ([place]) ARTHROPLASTY AND TENDON TRANSFER;  Surgeon: Iran Planas, MD;  Location: [place];  Service: Orthopedics;  Laterality: Right;  . HEMORRHOID SURGERY     almost 40 yrs ago  . HYSTEROSCOPY WITH D & C  08/23/2000   and resectoscopic myomectomy  . LUMBAR DISC SURGERY  03/16/2005  . LUMBAR EPIDURAL INJECTION    . PLANTAR FASCIA RELEASE Left 02/03/2010   and torn tendon  . TENDON TRANSFER Right 09/28/2015   Procedure: TENDON TRANSFER;  Surgeon: Iran Planas, MD;  Location: [place];  Service: Orthopedics;  Laterality: Right;  . TONSILLECTOMY    . TUBAL LIGATION    . WRIST SURGERY     left, removal of cyst     OB History   No obstetric history on file.     Family History  Problem Relation Age of Onset  . Pancreatic cancer Father   . Colon cancer Neg Hx   . Breast cancer Neg Hx  Social History   Tobacco Use  . Smoking status: Never Smoker  . Smokeless tobacco: Never Used  Substance Use Topics  . Alcohol use: No    Alcohol/week: 0.0 standard drinks  . Drug use: No    Home Medications Prior to Admission medications   Medication Sig Start Date End Date Taking? Authorizing Provider  amLODipine (NORVASC) 5 MG tablet Take 5 mg by mouth daily.      [provider]  aspirin 81 MG tablet Take 81 mg by mouth daily.    [provider]  cholecalciferol (VITAMIN D) 1000 UNITS tablet Take 1,000 Units by mouth daily.      [provider]   cycloSPORINE (RESTASIS) 0.05 % ophthalmic emulsion Place 1 drop into both eyes 2 (two) times daily.    [provider]  docusate sodium (COLACE) 100 MG capsule Take 1 capsule (100 mg total) by mouth 2 (two) times daily. 09/28/15   Iran Planas, MD  Dulaglutide (TRULICITY) A999333 0000000 SOPN Inject 0.75 mg into the skin once a week.    [provider]  empagliflozin (JARDIANCE) 10 MG TABS tablet Take 25 mg by mouth daily.  08/06/14   [provider]  famotidine (PEPCID) 20 MG tablet Take 1 tablet (20 mg total) by mouth 2 (two) times daily. 07/15/16   Larene Pickett, PA-C  GABAPENTIN PO Take by mouth.    [provider]  ibuprofen (ADVIL,MOTRIN) 800 MG tablet Take 800 mg by mouth 2 (two) times daily as needed for moderate pain.    [provider]  loratadine (CLARITIN) 10 MG tablet Take 10 mg by mouth daily as needed for allergies.     [provider]  Magnesium 250 MG TABS Take 1 tablet by mouth daily.    [provider]  metFORMIN (GLUCOPHAGE-XR) 500 MG 24 hr tablet Take 1,000 mg by mouth daily with breakfast.    [provider]  methocarbamol (ROBAXIN) 500 MG tablet Take 1 tablet (500 mg total) by mouth 4 (four) times daily. 09/28/15   Iran Planas, MD  Multiple Vitamin (MULTIVITAMIN) capsule Take 1 capsule by mouth daily.      [provider]  omeprazole (PRILOSEC OTC) 20 MG tablet Take 1 tablet (20 mg total) by mouth daily. 05/25/11 08/14/14  Gatha Mayer, MD  omeprazole (PRILOSEC) 20 MG capsule 20 mg. 08/19/15   [provider]  ondansetron (ZOFRAN) 4 MG tablet Take 1 tablet (4 mg total) by mouth every 8 (eight) hours as needed for nausea or vomiting. 09/28/15   Iran Planas, MD  oxyCODONE-acetaminophen (ROXICET) 5-325 MG tablet Take 1 tablet by mouth every 4 (four) hours as needed for severe pain. 09/28/15   Iran Planas, MD  predniSONE (DELTASONE) 20 MG tablet Take 40 mg by mouth daily for 3 days, then  20mg  by mouth daily for 3 days, then 10mg  daily for 3 days 07/15/16   Larene Pickett, PA-C  rOPINIRole (REQUIP) 2 MG tablet Take 2 mg by mouth at bedtime.  06/26/15   [provider]  telmisartan-hydrochlorothiazide (MICARDIS HCT) 80-25 MG per tablet Take 1 tablet by mouth daily.      [provider]    Allergies    Sitagliptin, Ace inhibitors, Betadine [povidone iodine], Codeine, Cortizone-10 [hydrocortisone], and Tetracyclines & related  Review of Systems   Review of Systems  Respiratory: Positive for shortness of breath.   All other systems reviewed and are negative.   Physical Exam Updated Vital Signs BP Marland Kitchen)  172/105 (BP Location: Left Arm)   Pulse (!) 137   Temp 97.8 F (36.6 C) (Oral)   Resp (!) 32   Ht 5\' 5"  (1.651 m)   Wt 88 kg   SpO2 92%   BMI 32.28 kg/m   Physical Exam Vitals and nursing note reviewed.  Constitutional:      General: She is not in acute distress.    Appearance: She is well-developed and well-nourished.  HENT:     Head: Normocephalic and atraumatic.     Mouth/Throat:     Mouth: Oropharynx is clear and moist.  Eyes:     Extraocular Movements: EOM normal.     Conjunctiva/sclera: Conjunctivae normal.     Pupils: Pupils are equal, round, and reactive to light.  Cardiovascular:     Rate and Rhythm: Normal rate and regular rhythm.     Heart sounds: Normal heart sounds.  Pulmonary:     Effort: Tachypnea present. No respiratory distress.     Breath sounds: Normal breath sounds.  Abdominal:     General: There is no distension.     Palpations: Abdomen is soft.     Tenderness: There is no abdominal tenderness.  Musculoskeletal:        General: No deformity or edema. Normal range of motion.     Cervical back: Normal range of motion and neck supple.  Skin:    General: Skin is warm and dry.  Neurological:     General: No focal deficit present.     Mental Status: She is alert and oriented to person, place, and time.  Psychiatric:         Mood and Affect: Mood and affect normal.     ED Results / Procedures / Treatments   Labs (all labs ordered are listed, but only abnormal results are displayed) Labs Reviewed  POC SARS CORONAVIRUS 2 AG - Abnormal; Notable for the following components:      Result Value   SARS Coronavirus 2 Ag POSITIVE (*)    All other components within normal limits  CULTURE, BLOOD (ROUTINE X 2)  CULTURE, BLOOD (ROUTINE X 2)  LACTIC ACID, PLASMA  LACTIC ACID, PLASMA  CBC WITH DIFFERENTIAL/PLATELET  COMPREHENSIVE METABOLIC PANEL  D-DIMER, QUANTITATIVE (NOT AT [facility])  PROCALCITONIN  LACTATE DEHYDROGENASE  FERRITIN  TRIGLYCERIDES  FIBRINOGEN  C-REACTIVE PROTEIN  BRAIN NATRIURETIC PEPTIDE  POC SARS CORONAVIRUS 2 AG -  ED  TROPONIN I (HIGH SENSITIVITY)  TROPONIN I (HIGH SENSITIVITY)    EKG EKG Interpretation  Date/Time:  Monday August 03 2020 11:41:51 EST Ventricular Rate:  110 PR Interval:    QRS Duration: 88 QT Interval:  309 QTC Calculation: 418 R Axis:   67 Text Interpretation: Sinus tachycardia Ventricular premature complex Probable left atrial enlargement Anteroseptal infarct, old Borderline repolarization abnormality Confirmed by [id number]) on 08/03/2020 1:49:42 PM   Radiology DG Chest 2 View  Result Date: 08/03/2020 CLINICAL DATA:  Increasing shortness of breath, cough and central chest pain for 1 week. Sinus infection 2 weeks ago. EXAM: CHEST - 2 VIEW COMPARISON:  Radiographs 07/04/2018 and 06/29/2009. FINDINGS: The heart size and mediastinal contours are stable. There is mild aortic atherosclerosis. Interval diffusely increased interstitial prominence with central airway and fissural thickening. No confluent airspace opacity, significant pleural effusion or pneumothorax. There are mild degenerative changes within the thoracic spine. IMPRESSION: Interval diffusely increased interstitial prominence with central airway and fissural thickening, suspicious for interstitial  edema. Progressive chronic interstitial lung disease considered less likely. Suggest  short-term radiographic follow-up. Electronically Signed   By: Richardean Sale M.D.   On: 08/03/2020 12:23    Procedures Procedures (including critical care time)  Medications Ordered in ED Medications  dexamethasone (DECADRON) injection 6 mg (has no administration in time range)    ED Course  I have reviewed the triage vital signs and the nursing notes.  Pertinent labs & imaging results that were available during my care of the patient were reviewed by me and considered in my medical decision making (see chart for details).    MDM Rules/Calculators/A&P                          MDM  Screen complete  [name] was evaluated in Emergency Department on 08/03/2020 for the symptoms described in the history of present illness. She was evaluated in the context of the global COVID-19 pandemic, which necessitated consideration that the patient might be at risk for infection with the SARS-CoV-2 virus that causes COVID-19. Institutional protocols and algorithms that pertain to the evaluation of patients at risk for COVID-19 are in a state of rapid change based on information released by regulatory bodies including the CDC and federal and state organizations. These policies and algorithms were followed during the patient's care in the ED.   Patient presents with complaint of dyspnea. Covid positive. Hypoxic with saturations into the 70's with ambulation. Improved with 3L [place], saturations up to mid-90's, and patient is comfortable.   Patient will require admission.   Signed out oncoming EDP - admission pending.    Final Clinical Impression(s) / ED Diagnoses Final diagnoses:  COVID    Rx / DC Orders ED Discharge Orders    None       Valarie Merino, MD 08/03/20 1501

## 2020-08-03 NOTE — ED Triage Notes (Signed)
Patient reports having a sinus infection 2 weeks ago (took amoxicillin), has been SOB, cough and having central chest pain x1 week, notices she is SOB when walking up stairs. Chest pain moves up her L arm/ neck area. Tachypnea and tachycardic in triage. Denies N/V/D, fevers.

## 2020-08-03 NOTE — H&P (Signed)
[facility] History and Physical  [name] [id number] DOB: [birth year] DOA: 08/03/2020 PCP: Leamon Arnt, MD  Admitted from: Home Chief Complaint: Worsening shortness of breath  History of Present Illness: [name] is a 75 y.o. female with PMH significant for DM2, HTN, chronic back pain, restless leg syndrome. Patient presented to the ED today for evaluation of progressively worsening shortness of breath.  Started 7 to 10 days ago.  Associated with subjective fever, mild cough.  Symptoms worse with exertion. Unvaccinated for COVID-19 While in the ED triads, she was noted to have oxygen saturation down to 70s.  Improved 90s with 3 L oxygen by nasal cannula. Tachycardic to more than 110 consistently, blood pressure initially elevated to 170s, improved now. Covid PCR positive Inflammatory markers elevated Chest x-ray in the ED compared to a month ago showed an interval diffusely increased interstitial prominence with central airway and fissural thickening suspicious for interstitial edema. Labs with BMP unremarkable except for elevated glucose at 204, BNP elevated to 531, LDH elevated to 22, troponin elevated to 65, lactic acid elevated to 3, procalcitonin less than 0.1 WBC count 11.4 Hospitalist service is consulted for inpatient management.  Seen and examined this afternoon the ED. Follows with female.  Propped up in bed.  On 2 L oxygen by nasal.  Has mild cough, lethargy.  Lives with husband and daughter who were vaccinated.  Patient chose not to get vaccinated which she is considering now.  Review of Systems:  All systems were reviewed and were negative unless otherwise mentioned in the HPI   Past medical history: Past Medical History:  Diagnosis Date  . Arthritis   . Chronic back pain    buldging disc,scoliosis,arthritis  . Diabetes mellitus without complication ([place])    takes Trulicity,Jardiance,and Metformin daily.Average fasting blood sugar runs around130   . GERD (gastroesophageal reflux disease)    takes Omeprazole daily  . History of bronchitis > 8 yrs ago  . History of shingles   . HTN (hypertension)    takes Amlodipine and Micardis daily  . Hx of colonic polyps    benign  . Internal and external hemorrhoids without complication   . Joint pain   . Mitral valve prolapse   . Nocturia   . PONV (postoperative nausea and vomiting)    when gets injections in joints gets hives.Betadine rash  . Restless leg syndrome    takes Requip at bedtime  . Seasonal allergies    takes Claritin daily as needed  . Urinary frequency   . Uterine fibroid     Past surgical history: Past Surgical History:  Procedure Laterality Date  . BUNIONECTOMY Bilateral   . COLONOSCOPY  07/12/2004   diverticulosis, internal and external hemorrhoids  . COLONOSCOPY    . FINGER ARTHROSCOPY WITH CARPOMETACARPEL [facility]) ARTHROPLASTY Right 09/28/2015   Procedure: RIGHT THUMB TRAPEZIUM EXCISION WITH CARPOMETACARPEL ([place]) ARTHROPLASTY AND TENDON TRANSFER;  Surgeon: Iran Planas, MD;  Location: [facility];  Service: Orthopedics;  Laterality: Right;  . HEMORRHOID SURGERY     almost 40 yrs ago  . HYSTEROSCOPY WITH D & C  08/23/2000   and resectoscopic myomectomy  . LUMBAR DISC SURGERY  03/16/2005  . LUMBAR EPIDURAL INJECTION    . PLANTAR FASCIA RELEASE Left 02/03/2010   and torn tendon  . TENDON TRANSFER Right 09/28/2015   Procedure: TENDON TRANSFER;  Surgeon: Iran Planas, MD;  Location: [place];  Service: Orthopedics;  Laterality: Right;  . TONSILLECTOMY    . TUBAL  LIGATION    . WRIST SURGERY     left, removal of cyst    Social History:  reports that she has never smoked. She has never used smokeless tobacco. She reports that she does not drink alcohol and does not use drugs.  Allergies:  Allergies  Allergen Reactions  . Amlodipine Swelling  . Depo-Medrol [Methylprednisolone] Hives  . Gabapentin Rash    hives   . Levothyroxine Other (See Comments)    severe muscle  pain in neck    . Simvastatin Rash  . Sitagliptin Other (See Comments)    Urinary hesitancy  . Ace Inhibitors     cough  . Betadine [Povidone Iodine]     rash  . Codeine Nausea And Vomiting  . Cortizone-10 [Hydrocortisone] Other (See Comments)    unknown  . Tetracyclines & Related Nausea And Vomiting    Family history:  Family History  Problem Relation Age of Onset  . Pancreatic cancer Father   . Colon cancer Neg Hx   . Breast cancer Neg Hx      Home Meds: Prior to Admission medications   Medication Sig Start Date End Date Taking? Authorizing Provider  allopurinol (ZYLOPRIM) 100 MG tablet Take 100 mg by mouth daily. 07/06/20  Yes [provider]  amLODipine (NORVASC) 5 MG tablet Take 5 mg by mouth daily.   Yes [provider]  aspirin 81 MG tablet Take 81 mg by mouth daily.   Yes [provider]  cholecalciferol (VITAMIN D) 1000 UNITS tablet Take 1,000 Units by mouth daily.   Yes [provider]  cycloSPORINE (RESTASIS) 0.05 % ophthalmic emulsion Place 1 drop into both eyes 2 (two) times daily.   Yes [provider]  diltiazem (CARDIZEM CD) 240 MG 24 hr capsule Take 240 mg by mouth daily. 07/19/20  Yes [provider]  Dulaglutide 0.75 MG/0.5ML SOPN Inject 0.75 mg into the skin once a week.   Yes [provider]  fluticasone (FLONASE) 50 MCG/ACT nasal spray Place 1 spray into both nostrils daily as needed for allergies. 01/11/20  Yes [provider]  glipiZIDE-metformin (METAGLIP) 5-500 MG tablet Take 1 tablet by mouth 2 (two) times daily before a meal.   Yes [provider]  ibuprofen (ADVIL,MOTRIN) 800 MG tablet Take 800 mg by mouth 2 (two) times daily as needed for moderate pain.   Yes [provider]  irbesartan-hydrochlorothiazide (AVALIDE) 300-12.5 MG tablet Take 1 tablet by mouth daily. 06/06/20  Yes [provider]  Magnesium 250 MG TABS Take 1 tablet by mouth daily.   Yes  [provider]  Multiple Vitamin (MULTIVITAMIN WITH MINERALS) TABS tablet Take 1 tablet by mouth daily.   Yes [provider]  Multiple Vitamin (MULTIVITAMIN) capsule Take 1 capsule by mouth daily.   Yes [provider]  NUCYNTA 50 MG tablet Take 50 mg by mouth at bedtime. 06/09/20  Yes [provider]  omeprazole (PRILOSEC) 40 MG capsule Take 40 mg by mouth daily.   Yes [provider]  ondansetron (ZOFRAN) 4 MG tablet Take 1 tablet (4 mg total) by mouth every 8 (eight) hours as needed for nausea or vomiting. 09/28/15  Yes Bradly Bienenstock, MD  pregabalin (LYRICA) 100 MG capsule Take 100 mg by mouth 2 (two) times daily. 07/21/20  Yes [provider]  rOPINIRole (REQUIP) 2 MG tablet Take 2 mg by mouth at bedtime.  06/26/15  Yes [provider]  telmisartan-hydrochlorothiazide (MICARDIS HCT) 80-25 MG tablet Take  1 tablet by mouth daily.   Yes [provider]  docusate sodium (COLACE) 100 MG capsule Take 1 capsule (100 mg total) by mouth 2 (two) times daily. Patient not taking: No sig reported 09/28/15   Bradly Bienenstock, MD  famotidine (PEPCID) 20 MG tablet Take 1 tablet (20 mg total) by mouth 2 (two) times daily. Patient not taking: No sig reported 07/15/16   Garlon Hatchet, PA-C  methocarbamol (ROBAXIN) 500 MG tablet Take 1 tablet (500 mg total) by mouth 4 (four) times daily. Patient not taking: No sig reported 09/28/15   Bradly Bienenstock, MD  omeprazole (PRILOSEC OTC) 20 MG tablet Take 1 tablet (20 mg total) by mouth daily. Patient not taking: Reported on 08/03/2020 05/25/11 08/14/14  Iva Boop, MD  oxyCODONE-acetaminophen (ROXICET) 5-325 MG tablet Take 1 tablet by mouth every 4 (four) hours as needed for severe pain. Patient not taking: No sig reported 09/28/15   Bradly Bienenstock, MD  predniSONE (DELTASONE) 20 MG tablet Take 40 mg by mouth daily for 3 days, then 20mg  by mouth daily for 3 days, then 10mg  daily for 3 days Patient not  taking: No sig reported 07/15/16   Garlon Hatchet, PA-C    Physical Exam: Vitals:   08/03/20 1530 08/03/20 1552 08/03/20 1600 08/03/20 1633  BP: 129/67 129/67 131/88 125/65  Pulse: (!) 109 (!) 109 (!) 112 (!) 107  Resp: (!) 28 16  16   Temp:      TempSrc:      SpO2: 94% 96% 94% 93%  Weight:      Height:       Wt Readings from Last 3 Encounters:  08/03/20 88 kg  07/04/18 87.5 kg  07/15/16 86.2 kg   Body mass index is 32.28 kg/m.  General exam: Pleasant elderly Caucasian female. Skin: No rashes, lesions or ulcers. HEENT: Atraumatic, normocephalic, no obvious bleeding Lungs: Mild scattered rales bilaterally in the bases CVS: Persistent tachycardia, regular rhythm, no murmur GI/Abd soft, nontender, nondistended, bowel sound present CNS: Alert, awake, oriented x3 Psychiatry: Mood appropriate Extremities: Trace bilateral pedal edema, no calf tenderness     Consult Orders  (From admission, onward)         Start     Ordered   08/03/20 1633  Consult to hospitalist  ALL PATIENTS BEING ADMITTED/HAVING PROCEDURES NEED COVID-19 SCREENING  Once       Comments: ALL PATIENTS BEING ADMITTED/HAVING PROCEDURES NEED COVID-19 SCREENING  Provider:  (Not yet assigned)  Question Answer Comment  Place call to: [facility]   Reason for Consult Admit      08/03/20 1632          Labs on Admission:   CBC: Recent Labs  Lab 08/03/20 1357  WBC 11.4*  NEUTROABS 10.1*  HGB 13.1  HCT 40.1  MCV 90.9  PLT 292    Basic Metabolic Panel: Recent Labs  Lab 08/03/20 1357  NA 138  K 4.0  CL 101  CO2 22  GLUCOSE 204*  BUN 23  CREATININE 1.10*  CALCIUM 9.9    Liver Function Tests: Recent Labs  Lab 08/03/20 1357  AST 26  ALT 28  ALKPHOS 107  BILITOT 1.0  PROT 7.4  ALBUMIN 4.2   No results for input(s): LIPASE, AMYLASE in the last 168 hours. No results for input(s): AMMONIA in the last 168 hours.  Cardiac Enzymes: No results for input(s): CKTOTAL, CKMB,  CKMBINDEX, TROPONINI in the last 168 hours.  BNP (last 3 results) Recent Labs  08/03/20 1357  BNP 530.6*    ProBNP (last 3 results) No results for input(s): PROBNP in the last 8760 hours.  CBG: No results for input(s): GLUCAP in the last 168 hours.  Lipase     Component Value Date/Time   LIPASE 21 06/22/2009 0740     Urinalysis    Component Value Date/Time   COLORURINE YELLOW 06/22/2009 0631   APPEARANCEUR CLEAR 06/22/2009 0631   LABSPEC 1.009 06/22/2009 0631   PHURINE 6.0 06/22/2009 0631   GLUCOSEU NEGATIVE 06/22/2009 0631   HGBUR NEGATIVE 06/22/2009 0631   BILIRUBINUR NEGATIVE 06/22/2009 0631   KETONESUR NEGATIVE 06/22/2009 0631   PROTEINUR NEGATIVE 06/22/2009 0631   UROBILINOGEN 0.2 06/22/2009 0631   NITRITE NEGATIVE 06/22/2009 0631   LEUKOCYTESUR MODERATE (A) 06/22/2009 0631     Drugs of Abuse  No results found for: LABOPIA, COCAINSCRNUR, [place], AMPHETMU, THCU, [place] on Admission: DG Chest 2 View  Result Date: 08/03/2020 CLINICAL DATA:  Increasing shortness of breath, cough and central chest pain for 1 week. Sinus infection 2 weeks ago. EXAM: CHEST - 2 VIEW COMPARISON:  Radiographs 07/04/2018 and 06/29/2009. FINDINGS: The heart size and mediastinal contours are stable. There is mild aortic atherosclerosis. Interval diffusely increased interstitial prominence with central airway and fissural thickening. No confluent airspace opacity, significant pleural effusion or pneumothorax. There are mild degenerative changes within the thoracic spine. IMPRESSION: Interval diffusely increased interstitial prominence with central airway and fissural thickening, suspicious for interstitial edema. Progressive chronic interstitial lung disease considered less likely. Suggest short-term radiographic follow-up. Electronically Signed   By: Richardean Sale M.D.   On: 08/03/2020 12:23      ------------------------------------------------------------------------------------------------------ Assessment/Plan: Principal Problem:   Pneumonia due to COVID-19 virus  COVID pneumonia Acute respiratory failure with hypoxia  -Presented with progressively worsening shortness of breath -COVID test: PCR positive on admission -Chest imaging: With interstitial infiltrates  -Treatment: IV remdesivir 5-day course to complete on 1/7.  Reports allergy to cortisone.  Patient tolerated dexamethasone in the ED.  We will keep her on dexamethasone 40 mg IV daily. -Oxygen saturation was 75% on room air.  Currently on 3 L oxygen by nasal cannula.  Continue the same.  Wean down as tolerated. -Protonix while on steroids. -Oxygen - SpO2: 93 % O2 Flow Rate (L/min): 2 L/min  -Supportive care: Vitamin C, Zinc, PRN inhalers, Tylenol, Antitussives (benzonatate/ Mucinex/Tussionex).   -Encouraged incentive spirometry, prone position, out of bed and early mobilization as much as possible -Continue airborne/contact isolation precautions for duration of 3 weeks from the day of diagnosis. -WBC and inflammatory markers trend as below.  Recent Labs  Lab 08/03/20 1357 08/03/20 1630  WBC 11.4*  --   LATICACIDVEN 3.0* 1.4  PROCALCITON <0.10  --   DDIMER 2.50*  --   FERRITIN 48  --   LDH 222*  --   CRP 1.6*  --   ALT 28  --    The treatment plan and use of medications and known side effects were discussed with patient/family. Some of the medications used are based on case reports/anecdotal data.  All other medications being used in the management of COVID-19 based on limited study data.  Complete risks and long-term side effects are unknown, however in the best clinical judgment they seem to be of some benefit.  Patient wanted to proceed with treatment options provided.  Elevated lactic acid level -Initial lactic acid level was elevated to 3, probably due to an elevated respiration.  Lactic acid  level  improved 1.4 on subsequent check.  No other evidence of sepsis.  Elevated troponin -Shortness of breath secondary to Covid.  No other anginal symptoms at this time.  Obtain an echocardiogram Recent Labs    08/03/20 1357  TROPONINIHS 66*  65*   Rule out CHF -No history of CHF but she has trace bilateral pedal edema.  Chest x-ray raises suspicion of pulmonary edema.  BNP slightly elevated. -Obtain echocardiogram. Recent Labs  Lab 08/03/20 1357  BNP 530.6*  BUN 23  CREATININE 1.10*  K 4.0   Essential hypertension -Home meds include Cardizem 240 mg daily, amlodipine 5 mg daily, irbesartan 300 mg daily, HCTZ 12.5 mg daily, -Resume all.  Continue to monitor blood pressure.  Type 2 diabetes mellitus Hyperglycemia -A1c 04/20/2016.  Repeat A1c. -Home meds include Dulaglutide 0.75 mg weekly on Thursday, glipizide 5 mg twice daily, Metformin 500 mg twice daily. -Initial blood sugar level elevated to 204.  Expected further elevation on steroids.  Resume glipizide 5 mg daily.  Keep others on hold.  Start on sliding scale insulin with Accu-Cheks.  Restless leg syndrome -Resume Lyrica 100 mg twice daily, Requip 2 mg at bedtime.  Mobility: Encourage ambulation Code Status:   Code Status: Full Code  DVT prophylaxis:  Lovenox Antimicrobials:  None Fluid: None  Diet:  Diet Order            Diet heart healthy/carb modified Room service appropriate? Yes; Fluid consistency: Thin  Diet effective now                  Consultants: None Family Communication:  Patient self updating the family  Dispo: The patient is from: Home              Anticipated d/c is to: Home              Anticipated d/c date is: Hopefully 2 to 3 days with outpatient IV remdesivir  ------------------------------------------------------------------------------------- Severity of Illness: The appropriate patient status for this patient is INPATIENT. Inpatient status is judged to be reasonable and necessary in  order to provide the required intensity of service to ensure the patient's safety. The patient's presenting symptoms, physical exam findings, and initial radiographic and laboratory data in the context of their chronic comorbidities is felt to place them at high risk for further clinical deterioration. Furthermore, it is not anticipated that the patient will be medically stable for discharge from the hospital within 2 midnights of admission. The following factors support the patient status of inpatient.   " The patient's presenting symptoms include shortness of breath. " The worrisome physical exam findings include hypoxia. " The initial radiographic and laboratory data are worrisome because of Covid related pneumonia. " The chronic co-morbidities include diabetes mellitus   * I certify that at the point of admission it is my clinical judgment that the patient will require inpatient hospital care spanning beyond 2 midnights from the point of admission due to high intensity of service, high risk for further deterioration and high frequency of surveillance required.*  Signed, Lorin Glass, MD [facility] 08/03/2020

## 2020-08-03 NOTE — ED Notes (Addendum)
Patient ambulated to cubby for vital sign recheck, O2 noted to be 75% RA after ambulation, tachycardic, tacypnic and SOB. 3 L [place] applied, oxygen now 91%.

## 2020-08-03 NOTE — ED Notes (Signed)
POC COVID POS GIVEN TO M[facility]ICK

## 2020-08-04 ENCOUNTER — Inpatient Hospital Stay (HOSPITAL_COMMUNITY): Payer: Medicare Other

## 2020-08-04 DIAGNOSIS — R9431 Abnormal electrocardiogram [ECG] [EKG]: Secondary | ICD-10-CM | POA: Diagnosis not present

## 2020-08-04 DIAGNOSIS — U071 COVID-19: Secondary | ICD-10-CM | POA: Diagnosis not present

## 2020-08-04 DIAGNOSIS — J1282 Pneumonia due to coronavirus disease 2019: Secondary | ICD-10-CM | POA: Diagnosis not present

## 2020-08-04 DIAGNOSIS — I340 Nonrheumatic mitral (valve) insufficiency: Secondary | ICD-10-CM

## 2020-08-04 DIAGNOSIS — I361 Nonrheumatic tricuspid (valve) insufficiency: Secondary | ICD-10-CM | POA: Diagnosis not present

## 2020-08-04 LAB — LACTIC ACID, PLASMA: Lactic Acid, Venous: 1 mmol/L (ref 0.5–1.9)

## 2020-08-04 LAB — BASIC METABOLIC PANEL
Anion gap: 10 (ref 5–15)
BUN: 32 mg/dL — ABNORMAL HIGH (ref 8–23)
CO2: 23 mmol/L (ref 22–32)
Calcium: 9.3 mg/dL (ref 8.9–10.3)
Chloride: 103 mmol/L (ref 98–111)
Creatinine, Ser: 1.35 mg/dL — ABNORMAL HIGH (ref 0.44–1.00)
GFR, Estimated: 41 mL/min — ABNORMAL LOW (ref >60)
Glucose, Bld: 230 mg/dL — ABNORMAL HIGH (ref 70–99)
Potassium: 4.5 mmol/L (ref 3.5–5.1)
Sodium: 136 mmol/L (ref 135–145)

## 2020-08-04 LAB — CBG MONITORING, ED
Glucose-Capillary: 224 mg/dL — ABNORMAL HIGH (ref 70–99)
Glucose-Capillary: 283 mg/dL — ABNORMAL HIGH (ref 70–99)
Glucose-Capillary: 215 mg/dL — ABNORMAL HIGH (ref 70–99)
Glucose-Capillary: 145 mg/dL — ABNORMAL HIGH (ref 70–99)

## 2020-08-04 LAB — ECHOCARDIOGRAM COMPLETE
Area-P 1/2: 3.65 cm2
Height: 65 in
S' Lateral: 4.9 cm
Weight: 3104.08 oz

## 2020-08-04 LAB — D-DIMER, QUANTITATIVE: D-Dimer, Quant: 1.9 ug/mL-FEU — ABNORMAL HIGH (ref 0.00–0.50)

## 2020-08-04 LAB — CBC
HCT: 33.8 % — ABNORMAL LOW (ref 36.0–46.0)
Hemoglobin: 11.3 g/dL — ABNORMAL LOW (ref 12.0–15.0)
MCH: 30.1 pg (ref 26.0–34.0)
MCHC: 33.4 g/dL (ref 30.0–36.0)
MCV: 89.9 fL (ref 80.0–100.0)
Platelets: 254 10*3/uL (ref 150–400)
RBC: 3.76 MIL/uL — ABNORMAL LOW (ref 3.87–5.11)
RDW: 14 % (ref 11.5–15.5)
WBC: 5.9 10*3/uL (ref 4.0–10.5)
nRBC: 0 % (ref 0.0–0.2)

## 2020-08-04 LAB — C-REACTIVE PROTEIN: CRP: 3.6 mg/dL — ABNORMAL HIGH (ref <1.0)

## 2020-08-04 LAB — BRAIN NATRIURETIC PEPTIDE: B Natriuretic Peptide: 1567.5 pg/mL — ABNORMAL HIGH (ref 0.0–100.0)

## 2020-08-04 LAB — FERRITIN: Ferritin: 52 ng/mL (ref 11–307)

## 2020-08-04 MED ORDER — ALBUTEROL SULFATE HFA 108 (90 BASE) MCG/ACT IN AERS
2.0000 | INHALATION_SPRAY | Freq: Four times a day (QID) | RESPIRATORY_TRACT | Status: DC
  Administered 2020-08-04 – 2020-08-06 (×9): 2 via RESPIRATORY_TRACT
  Filled 2020-08-04 (×5): qty 6.7

## 2020-08-04 MED ORDER — FUROSEMIDE 10 MG/ML IJ SOLN
40.0000 mg | Freq: Once | INTRAMUSCULAR | Status: AC
  Administered 2020-08-04: 40 mg via INTRAVENOUS
  Filled 2020-08-04: qty 4

## 2020-08-04 MED ORDER — LINAGLIPTIN 5 MG PO TABS
5.0000 mg | ORAL_TABLET | Freq: Every day | ORAL | Status: DC
  Administered 2020-08-04 – 2020-08-06 (×3): 5 mg via ORAL
  Filled 2020-08-04 (×3): qty 1

## 2020-08-04 MED ORDER — INSULIN DETEMIR 100 UNIT/ML ~~LOC~~ SOLN
6.0000 [IU] | Freq: Two times a day (BID) | SUBCUTANEOUS | Status: DC
  Administered 2020-08-05 – 2020-08-06 (×4): 6 [IU] via SUBCUTANEOUS
  Filled 2020-08-04 (×5): qty 0.06

## 2020-08-04 NOTE — Progress Notes (Signed)
Echocardiogram came back with EF 20 to 25% and grade 2 diastolic dysfunction, mild elevated pulmonary artery pressure. Patient has trace to 1+ bilateral pedal edema. We will give her Lasix 1 dose 40 mg this afternoon. Message passed to cardiology team for formal consultation tomorrow.

## 2020-08-04 NOTE — Progress Notes (Signed)
  Echocardiogram 2D Echocardiogram has been performed.  Rylea Selway G Yunuen Mordan 08/04/2020, 3:03 PM

## 2020-08-04 NOTE — Progress Notes (Signed)
PROGRESS NOTE  [name]  DOB: 01-05-1946  PCP: No primary care provider on file. [id number]  DOA: 08/03/2020  LOS: 1 day   Chief Complaint  Patient presents with  . Chest Pain  . Shortness of Breath  . Cough   Brief narrative: [name] is a 75 y.o. female with PMH significant for DM2, HTN, chronic back pain, restless leg syndrome. Patient presented to the ED today for evaluation of progressively worsening shortness of breath.  Started 7 to 10 days ago.  Associated with subjective fever, mild cough.  Symptoms worse with exertion. Unvaccinated for COVID-19 While in the ED triads, she was noted to have oxygen saturation down to 70s.  Improved 90s with 3 L oxygen by nasal cannula. Tachycardic to more than 110 consistently, blood pressure initially elevated to 170s, improved now. Covid PCR positive Inflammatory markers elevated Chest x-ray in the ED compared to a month ago showed an interval diffusely increased interstitial prominence with central airway and fissural thickening suspicious for interstitial edema. Labs with BMP unremarkable except for elevated glucose at 204, BNP elevated to 531, LDH elevated to 22, troponin elevated to 65, lactic acid elevated to 3, procalcitonin less than 0.1 WBC count 11.4 Patient was admitted to hospitalist service for further evaluation and management.  Subjective: Patient was seen and examined this morning. Elderly female, sitting up in chair.  Not in distress.  Feels better than at presentation.  Assessment/Plan: COVID pneumonia Acute respiratory failure with hypoxia  -Presented with progressively worsening shortness of breath -COVID test: PCR positive on admission -Chest imaging: With interstitial infiltrates  -Treatment: IV remdesivir 5-day course to complete on 1/7.  Reports allergy to cortisone.  Currently on dexamethasone 4 mg IV daily.  -On 3 L oxygen by nasal cannula. Wean down as tolerated. -Although CRP is elevated  today, patient feels better. -Supportive care: Vitamin C, Zinc, PRN inhalers, Tylenol, Antitussives (benzonatate/ Mucinex/Tussionex).   -Encouraged incentive spirometry, prone position, out of bed and early mobilization as much as possible -Continue airborne/contact isolation precautions for duration of 3 weeks from the day of diagnosis. -WBC and inflammatory markers trend as below.  Continue to monitor Recent Labs  Lab 08/03/20 1357 08/03/20 1630 08/04/20 0255  WBC 11.4*  --  5.9  LATICACIDVEN 3.0* 1.4 1.0  PROCALCITON <0.10  --   --   DDIMER 2.50*  --  1.90*  FERRITIN 48  --  52  LDH 222*  --   --   CRP 1.6*  --  3.6*  ALT 28  --   --    The treatment plan and use of medications and known side effects were discussed with patient/family. Some of the medications used are based on case reports/anecdotal data. All other medications being used in the management of COVID-19 based on limited study data. Complete risks and long-term side effects are unknown, however in the best clinical judgment they seem to be of some benefit. Patientwanted to proceed with treatment options provided.  Elevated lactic acid level -Initial lactic acid level was elevated to 3, probably due to an anaerobic respiration.  Lactic acid level improved on further check Recent Labs  Lab 08/03/20 1357 08/03/20 1630 08/04/20 0255  LATICACIDVEN 3.0* 1.4 1.0   Elevated troponin -Shortness of breath secondary to Covid.  No other anginal symptoms at this time.   -Pending echocardiogram.    Rule out CHF -No history of CHF but she has trace bilateral pedal edema.  Chest x-ray raises suspicion of  pulmonary edema.  BNP and creatinine are both elevated -Pending echocardiogram.  I would hold off on diuresis at this time -Net IO Since Admission: 250 mL [08/04/20 1425] -Continue to monitor for daily intake output, weight, blood pressure, BNP, renal function and electrolytes. Recent Labs  Lab 08/03/20 1357  08/04/20 0255  BNP 530.6* 1,567.5*  BUN 23 32*  CREATININE 1.10* 1.35*  K 4.0 4.5   Essential hypertension -Home meds include Cardizem 240 mg daily, amlodipine 5 mg daily, irbesartan 300 mg daily, HCTZ 12.5 mg daily, -Continue all.  Continue to monitor blood pressure.  Type 2 diabetes mellitus Hyperglycemia -A1c 6.6 on 1/3 -Home meds include Dulaglutide 0.75 mg weekly on Thursday, glipizide 5 mg twice daily, Metformin 500 mg twice daily. -Blood sugar level elevated because of steroids.  Currently continued on glipizide.  Others on hold. -Diabetes care coordinator consult appreciated.  Will start on Tradjenta 5 mg daily and Levemir 6 units twice daily.  -Initial blood sugar level elevated to 204.  Expected further elevation on steroids.  Resume glipizide 5 mg daily.  Keep others on hold.  Start on sliding scale insulin with Accu-Cheks.  Restless leg syndrome -Resume Lyrica 100 mg twice daily, Requip 2 mg at bedtime.  Mobility: Encourage ambulation Code Status:   Code Status: Full Code  Nutritional status: Body mass index is 32.28 kg/m.     Diet Order            Diet heart healthy/carb modified Room service appropriate? Yes; Fluid consistency: Thin  Diet effective now                 DVT prophylaxis: enoxaparin (LOVENOX) injection 40 mg Start: 08/03/20 2200   Antimicrobials:  None Fluid: None Consultants: None Family Communication:  Patient is updating the family by herself  Status is: Inpatient  Remains inpatient appropriate because -ongoing treatment for Covid  Dispo: The patient is from: Home              Anticipated d/c is to: Home              Anticipated d/c date is: As early as tomorrow if CRP is improving and patient agrees to outpatient remdesivir              Patient currently is not medically stable to d/c.       Infusions:  . remdesivir 100 mg in NS 100 mL 100 mg (08/04/20 1247)    Scheduled Meds: . albuterol  2 puff Inhalation Q6H  .  allopurinol  100 mg Oral Daily  . amLODipine  5 mg Oral Daily  . aspirin EC  81 mg Oral Daily  . cholecalciferol  1,000 Units Oral Daily  . cycloSPORINE  1 drop Both Eyes BID  . dexamethasone (DECADRON) injection  4 mg Intravenous Q24H  . diltiazem  240 mg Oral Daily  . docusate sodium  100 mg Oral BID  . enoxaparin (LOVENOX) injection  40 mg Subcutaneous Q24H  . glipiZIDE  5 mg Oral BID AC  . insulin aspart  0-5 Units Subcutaneous QHS  . insulin aspart  0-9 Units Subcutaneous TID WC  . magnesium oxide  400 mg Oral Daily  . multivitamin with minerals  1 tablet Oral Daily  . multivitamin with minerals  1 tablet Oral Daily  . pantoprazole (PROTONIX) IV  40 mg Intravenous Q24H  . pregabalin  100 mg Oral BID  . rOPINIRole  2 mg Oral QHS  . senna  1  tablet Oral BID  . zinc sulfate  220 mg Oral Daily    Antimicrobials: Anti-infectives (From admission, onward)   Start     Dose/Rate Route Frequency Ordered Stop   08/04/20 1000  remdesivir 100 mg in sodium chloride 0.9 % 100 mL IVPB       "Followed by" Linked Group Details   100 mg 200 mL/hr over 30 Minutes Intravenous Daily 08/03/20 1440 08/08/20 0959   08/03/20 1500  remdesivir 200 mg in sodium chloride 0.9% 250 mL IVPB       "Followed by" Linked Group Details   200 mg 580 mL/hr over 30 Minutes Intravenous Once 08/03/20 1440 08/03/20 2033      PRN meds: acetaminophen **OR** acetaminophen, fluticasone, guaiFENesin-dextromethorphan, hydrALAZINE, ondansetron **OR** ondansetron (ZOFRAN) IV, polyethylene glycol, zolpidem   Objective: Vitals:   08/04/20 1000 08/04/20 1300  BP: 119/60 130/74  Pulse: 84 93  Resp: 18 18  Temp:    SpO2: 93% 94%    Intake/Output Summary (Last 24 hours) at 08/04/2020 1425 Last data filed at 08/03/2020 2033 Gross per 24 hour  Intake 250 ml  Output --  Net 250 ml   Filed Weights   08/03/20 1144  Weight: 88 kg   Weight change:  Body mass index is 32.28 kg/m.   Physical Exam: General exam:  Pleasant, elderly female.  Not in distress Skin: No rashes, lesions or ulcers. HEENT: Atraumatic, normocephalic, no obvious bleeding Lungs: Clear to auscultation bilaterally CVS: Regular rate and rhythm, no murmur GI/Abd soft, nontender, nondistended, bowel sound present CNS: Alert, awake, oriented x3 Psychiatry: Mood appropriate Extremities: No pedal edema, no calf tenderness  Data Review: I have personally reviewed the laboratory data and studies available.  Recent Labs  Lab 08/03/20 1357 08/04/20 0255  WBC 11.4* 5.9  NEUTROABS 10.1*  --   HGB 13.1 11.3*  HCT 40.1 33.8*  MCV 90.9 89.9  PLT 292 254   Recent Labs  Lab 08/03/20 1357 08/04/20 0255  NA 138 136  K 4.0 4.5  CL 101 103  CO2 22 23  GLUCOSE 204* 230*  BUN 23 32*  CREATININE 1.10* 1.35*  CALCIUM 9.9 9.3    F/u labs ordered  Signed, Lorin Glass, MD [facility] 08/04/2020

## 2020-08-04 NOTE — Progress Notes (Signed)
Inpatient Diabetes Program Recommendations  AACE/ADA: New Consensus Statement on Inpatient Glycemic Control (2015)  Target Ranges:  Prepandial:   less than 140 mg/dL      Peak postprandial:   less than 180 mg/dL (1-2 hours)      Critically ill patients:  140 - 180 mg/dL   Lab Results  Component Value Date   GLUCAP 283 (H) 08/04/2020   HGBA1C 6.6 (H) 08/03/2020    Review of Glycemic Control Results for [name], [name] (MRN [id number]) as of 08/04/2020 13:24  Ref. Range 08/03/2020 20:09 08/03/2020 22:18 08/04/2020 07:43 08/04/2020 11:56  Glucose-Capillary Latest Ref Range: 70 - 99 mg/dL 259 (H) 563 (H) 875 (H) 283 (H)   Diabetes history: Type 2 DM Outpatient Diabetes medications: Trulicity 0.75 mg qwk, Metaglip 5-500 mg BID Current orders for Inpatient glycemic control: Glipizide 5 mg BID, Novolog 0-9 units TID, Novolog 0-5 units QHS Decadron 4 mg QD  Inpatient Diabetes Program Recommendations:    In the setting of Covid and steroids consider:  -Adding Tradjenta 5 mg QD -Adding Levemir 6 units BID  Thanks, Lujean Rave, MSN, RNC-OB Diabetes Coordinator [phone] (8a-5p)

## 2020-08-05 DIAGNOSIS — I5021 Acute systolic (congestive) heart failure: Secondary | ICD-10-CM

## 2020-08-05 DIAGNOSIS — J1282 Pneumonia due to coronavirus disease 2019: Secondary | ICD-10-CM

## 2020-08-05 DIAGNOSIS — U071 COVID-19: Secondary | ICD-10-CM | POA: Diagnosis not present

## 2020-08-05 LAB — GLUCOSE, CAPILLARY: Glucose-Capillary: 163 mg/dL — ABNORMAL HIGH (ref 70–99)

## 2020-08-05 LAB — CBG MONITORING, ED
Glucose-Capillary: 141 mg/dL — ABNORMAL HIGH (ref 70–99)
Glucose-Capillary: 111 mg/dL — ABNORMAL HIGH (ref 70–99)
Glucose-Capillary: 103 mg/dL — ABNORMAL HIGH (ref 70–99)

## 2020-08-05 LAB — TROPONIN I (HIGH SENSITIVITY): Troponin I (High Sensitivity): 125 ng/L (ref <18)

## 2020-08-05 MED ORDER — DIPHENHYDRAMINE HCL 25 MG PO CAPS
25.0000 mg | ORAL_CAPSULE | Freq: Three times a day (TID) | ORAL | Status: DC | PRN
  Administered 2020-08-05 (×2): 25 mg via ORAL
  Filled 2020-08-05 (×2): qty 1

## 2020-08-05 MED ORDER — CARVEDILOL 3.125 MG PO TABS
3.1250 mg | ORAL_TABLET | Freq: Two times a day (BID) | ORAL | Status: DC
  Administered 2020-08-05 – 2020-08-06 (×3): 3.125 mg via ORAL
  Filled 2020-08-05 (×3): qty 1

## 2020-08-05 NOTE — ED Notes (Addendum)
This RN in to draw morning labs. Pt is arousable to voice. Denies pain or SHOB. She states she was given Dexamethasone last night and started having redness and itching on arms and legs since last night but was able to fall asleep and never told anyone. She is refusing further use of steroids. No airway involvement at this time. LSC bilat. Message sent to Dr. Pola Corn. Awaiting response. Pt is stable at this time.

## 2020-08-05 NOTE — ED Notes (Addendum)
Pt eating breakfast. Sts redness and itching much improved. Airway patent.

## 2020-08-05 NOTE — Progress Notes (Signed)
[facility]  PROGRESS NOTE  [name] [id number] DOB: September 24, 1945 DOA: 08/03/2020 PCP: No primary care provider on file.   Brief HPI:   75 year old female with past medical history of diabetes mellitus type 2, hypertension, chronic back pain, restless leg syndrome came with worsening shortness of breath.  She is unvaccinated for COVID-19.  In the ED COVID-19 PCR was positive.  Chest x-ray showed interval diffusely increased interstitial prominence with central airway and facial thickening suspicious for interstitial edema.  Patient's echocardiogram showed EF of 20 to 25% with grade 2 diastolic dysfunction and mild elevated pulmonary artery pressure.  She was given Lasix 40 mg IV x1, cardiology was consulted.    Subjective   Patient seen and examined, denies shortness of breath.  Developed erythematous rash after she received Decadron last night.   Assessment/Plan:     1. Acute hypoxemic respiratory failure-patient presented with worsening shortness of breath, COVID-19 PCR positive.  Started on remdesivir 5-day course to complete on 08/07/2020, patient has allergy to cortisone.  She was given Decadron last night which caused erythematous rash on upper extremities, abdomen, lower back.  CRP is 3.6, continue incentive spirometry, prone positioning, early mobilization.  Oxygen requirement has gone down to 1 L/min.  Will discontinue Decadron.  Continue with remdesivir. 2. Acute systolic CHF-echocardiogram obtained shows EF 20-25%.  Cardiology was consulted.  She has trace edema in the lower extremities.  Patient will need cardiac cath versus coronary CTA as outpatient.  Started on Coreg 3.125 mg p.o. twice daily.  Will need SGLT-2 inhibitor before discharge.  Entresto and spironolactone will be started as per cardiology discretion. 3. Diabetes mellitus type 2-we will hold glipizide, continue Tradjenta 5 mg daily, Levemir 6 units daily.  Decadron has been discontinued due to allergic  reaction.  Continue sliding scale insulin with NovoLog. 4. Hypertension-continue Cardizem 240 mg daily, amlodipine 5 mg daily, irbesartan 300 mg daily, HCTZ 12.5 mg daily. 5. Restless leg syndrome-continue Lyrica 100 g daily, Requip 2 mg at bedtime.    COVID-19 Labs  Recent Labs    08/03/20 1357 08/04/20 0255  DDIMER 2.50* 1.90*  FERRITIN 48 52  LDH 222*  --   CRP 1.6* 3.6*    No results found for: SARSCOV2NAA   Scheduled medications:   . albuterol  2 puff Inhalation Q6H  . allopurinol  100 mg Oral Daily  . amLODipine  5 mg Oral Daily  . aspirin EC  81 mg Oral Daily  . carvedilol  3.125 mg Oral BID WC  . cholecalciferol  1,000 Units Oral Daily  . cycloSPORINE  1 drop Both Eyes BID  . dexamethasone (DECADRON) injection  4 mg Intravenous Q24H  . docusate sodium  100 mg Oral BID  . enoxaparin (LOVENOX) injection  40 mg Subcutaneous Q24H  . glipiZIDE  5 mg Oral BID AC  . insulin aspart  0-5 Units Subcutaneous QHS  . insulin aspart  0-9 Units Subcutaneous TID WC  . insulin detemir  6 Units Subcutaneous BID  . linagliptin  5 mg Oral Daily  . magnesium oxide  400 mg Oral Daily  . multivitamin with minerals  1 tablet Oral Daily  . pantoprazole (PROTONIX) IV  40 mg Intravenous Q24H  . pregabalin  100 mg Oral BID  . rOPINIRole  2 mg Oral QHS  . senna  1 tablet Oral BID  . zinc sulfate  220 mg Oral Daily         CBG: Recent Labs  Lab 08/04/20  1156 08/04/20 1657 08/04/20 2244 08/05/20 0744 08/05/20 1153  GLUCAP 283* 215* 145* 141* 111*    SpO2: 100 % O2 Flow Rate (L/min): 1 L/min    CBC: Recent Labs  Lab 08/03/20 1357 08/04/20 0255  WBC 11.4* 5.9  NEUTROABS 10.1*  --   HGB 13.1 11.3*  HCT 40.1 33.8*  MCV 90.9 89.9  PLT 292 0000000    Basic Metabolic Panel: Recent Labs  Lab 08/03/20 1357 08/04/20 0255  NA 138 136  K 4.0 4.5  CL 101 103  CO2 22 23  GLUCOSE 204* 230*  BUN 23 32*  CREATININE 1.10* 1.35*  CALCIUM 9.9 9.3     Liver Function  Tests: Recent Labs  Lab 08/03/20 1357  AST 26  ALT 28  ALKPHOS 107  BILITOT 1.0  PROT 7.4  ALBUMIN 4.2     Antibiotics: Anti-infectives (From admission, onward)   Start     Dose/Rate Route Frequency Ordered Stop   08/04/20 1000  remdesivir 100 mg in sodium chloride 0.9 % 100 mL IVPB       "Followed by" Linked Group Details   100 mg 200 mL/hr over 30 Minutes Intravenous Daily 08/03/20 1440 08/08/20 0959   08/03/20 1500  remdesivir 200 mg in sodium chloride 0.9% 250 mL IVPB       "Followed by" Linked Group Details   200 mg 580 mL/hr over 30 Minutes Intravenous Once 08/03/20 1440 08/03/20 2033       DVT prophylaxis: Lovenox  Code Status: Full code  Family Communication: No family at bedside   Consultants:  Cardiology  Procedures:      Objective   Vitals:   08/05/20 0600 08/05/20 0815 08/05/20 1000 08/05/20 1312  BP: 106/73 (!) 154/84 139/68 131/84  Pulse: 88 94 82 85  Resp: 16 20 16 16   Temp:  97.6 F (36.4 C)    TempSrc:  Oral    SpO2: 98% 93% 95% 100%  Weight:      Height:        Intake/Output Summary (Last 24 hours) at 08/05/2020 1314 Last data filed at 08/04/2020 1317 Gross per 24 hour  Intake 100 ml  Output --  Net 100 ml    01/03 1901 - 01/05 0700 In: 350  Out: -   Filed Weights   08/03/20 1144  Weight: 88 kg    Physical Examination:   General-appears in no acute distress Heart-S1-S2, regular, no murmur auscultated Lungs-clear to auscultation bilaterally, no wheezing or crackles auscultated Abdomen-soft, nontender, no organomegaly Extremities-trace edema in the lower extremities Neuro-alert, oriented x3, no focal deficit noted  Status is: Inpatient  Dispo: The patient is from: Home              Anticipated d/c is to: Home              Anticipated d/c date is: 08/06/2020              Patient currently not stable for discharge  Barrier to discharge-acute hypoxemic respiratory failure      Data Reviewed:   Recent Results  (from the past 240 hour(s))  Blood Culture (routine x 2)     Status: None (Preliminary result)   Collection Time: 08/03/20  1:57 PM   Specimen: BLOOD  Result Value Ref Range Status   Specimen Description   Final    BLOOD RIGHT ANTECUBITAL Performed at [facility] [address] [place], [place] [address]    Special Requests  Final    BOTTLES DRAWN AEROBIC AND ANAEROBIC Blood Culture results may not be optimal due to an inadequate volume of blood received in culture bottles Performed at [facility], [place] [address] [place], [place] [address]    Culture   Final    NO GROWTH 2 DAYS Performed at [facility] [address] [place], [place] [address]    Report Status PENDING  Incomplete  Blood Culture (routine x 2)     Status: None (Preliminary result)   Collection Time: 08/03/20  1:57 PM   Specimen: BLOOD RIGHT FOREARM  Result Value Ref Range Status   Specimen Description   Final    BLOOD RIGHT FOREARM Performed at [facility] [address], [place], [place] [address]    Special Requests   Final    BOTTLES DRAWN AEROBIC AND ANAEROBIC Blood Culture results may not be optimal due to an inadequate volume of blood received in culture bottles Performed at [facility] [address], [place], [place] [address]    Culture   Final    NO GROWTH 2 DAYS Performed at [facility] [address] [place], [place] [address]    Report Status PENDING  Incomplete    No results for input(s): LIPASE, AMYLASE in the last 168 hours. No results for input(s): AMMONIA in the last 168 hours.  Cardiac Enzymes: No results for input(s): CKTOTAL, CKMB, CKMBINDEX, TROPONINI in the last 168 hours. BNP (last 3 results) Recent Labs    08/03/20 1357 08/04/20 0255  BNP 530.6* 1,567.5*    ProBNP (last 3 results) No results for input(s): PROBNP in the last 8760 hours.  Studies:  ECHOCARDIOGRAM  COMPLETE  Result Date: 08/04/2020    ECHOCARDIOGRAM REPORT   Patient Name:   [name] Date of Exam: 08/04/2020 Medical Rec #:  [id number]      Height:       65.0 in Accession #:    [id number]     Weight:       194.0 lb Date of Birth:  04/13/1946      BSA:          1.953 m Patient Age:    41 years       BP:           127/85 mmHg Patient Gender: F              HR:           85 bpm. Exam Location:  Inpatient Procedure: 2D Echo, Cardiac Doppler and Color Doppler Indications:    R94.31 Abnormal EKG  History:        Patient has no prior history of Echocardiogram examinations.                 Mitral Valve Prolapse; Risk Factors:Diabetes, Hypertension and                 GERD. COVID-19 Positive.  Sonographer:    Tiffany Dance Referring Phys: [id number] [place]  1. Left ventricular ejection fraction, by estimation, is 20 to 25%. The left ventricle has severely decreased function. The left ventricle demonstrates global hypokinesis. The left ventricular internal cavity size was mildly to moderately dilated. Left ventricular diastolic parameters are consistent with Grade II diastolic dysfunction (pseudonormalization). Elevated left atrial pressure.  2. Right ventricular systolic function is normal. The right ventricular size is normal. There is mildly elevated pulmonary artery systolic pressure. The  estimated right ventricular systolic pressure is 37.8 mmHg.  3. Left atrial size was moderately dilated.  4. The mitral valve is degenerative. Mild mitral valve regurgitation. No evidence of mitral stenosis.  5. The aortic valve is tricuspid. There is mild calcification of the aortic valve. There is mild thickening of the aortic valve. Aortic valve regurgitation is not visualized. Mild aortic valve sclerosis is present, with no evidence of aortic valve stenosis.  6. The inferior vena cava is dilated in size with <50% respiratory variability, suggesting right atrial pressure of 15 mmHg. FINDINGS  Left Ventricle:  Left ventricular ejection fraction, by estimation, is 20 to 25%. The left ventricle has severely decreased function. The left ventricle demonstrates global hypokinesis. The left ventricular internal cavity size was mildly to moderately dilated. There is no left ventricular hypertrophy. Left ventricular diastolic parameters are consistent with Grade II diastolic dysfunction (pseudonormalization). Elevated left atrial pressure. Right Ventricle: The right ventricular size is normal. No increase in right ventricular wall thickness. Right ventricular systolic function is normal. There is mildly elevated pulmonary artery systolic pressure. The tricuspid regurgitant velocity is 2.39  m/s, and with an assumed right atrial pressure of 15 mmHg, the estimated right ventricular systolic pressure is 37.8 mmHg. Left Atrium: Left atrial size was moderately dilated. Right Atrium: Right atrial size was normal in size. Pericardium: Trivial pericardial effusion is present. Mitral Valve: The mitral valve is degenerative in appearance. Mild mitral annular calcification. Mild mitral valve regurgitation. No evidence of mitral valve stenosis. Tricuspid Valve: The tricuspid valve is grossly normal. Tricuspid valve regurgitation is mild . No evidence of tricuspid stenosis. Aortic Valve: The aortic valve is tricuspid. There is mild calcification of the aortic valve. There is mild thickening of the aortic valve. Aortic valve regurgitation is not visualized. Mild aortic valve sclerosis is present, with no evidence of aortic valve stenosis. Pulmonic Valve: The pulmonic valve was grossly normal. Pulmonic valve regurgitation is trivial. No evidence of pulmonic stenosis. Aorta: The aortic root and ascending aorta are structurally normal, with no evidence of dilitation. Venous: The inferior vena cava is dilated in size with less than 50% respiratory variability, suggesting right atrial pressure of 15 mmHg. IAS/Shunts: The atrial septum is grossly  normal.  LEFT VENTRICLE PLAX 2D LVIDd:         5.70 cm LVIDs:         4.90 cm LV PW:         1.10 cm LV IVS:        0.80 cm LVOT diam:     1.80 cm LV SV:         43 LV SV Index:   22 LVOT Area:     2.54 cm  RIGHT VENTRICLE             IVC RV Basal diam:  2.90 cm     IVC diam: 2.30 cm RV S prime:     12.30 cm/s TAPSE (M-mode): 1.9 cm LEFT ATRIUM             Index       RIGHT ATRIUM           Index LA diam:        5.10 cm 2.61 cm/m  RA Area:     14.20 cm LA Vol (A2C):   89.8 ml 45.99 ml/m RA Volume:   35.50 ml  18.18 ml/m LA Vol (A4C):   83.8 ml 42.91 ml/m LA Biplane Vol: 88.6 ml 45.37 ml/m  AORTIC  VALVE LVOT Vmax:   70.10 cm/s LVOT Vmean:  51.500 cm/s LVOT VTI:    0.168 m  AORTA Ao Root diam: 3.00 cm Ao Asc diam:  3.10 cm MITRAL VALVE                TRICUSPID VALVE MV Area (PHT): 3.65 cm     TR Peak grad:   22.8 mmHg MV Decel Time: 208 msec     TR Vmax:        239.00 cm/s MV E velocity: 118.00 cm/s MV A velocity: 86.60 cm/s   SHUNTS MV E/A ratio:  1.36         Systemic VTI:  0.17 m                             Systemic Diam: 1.80 cm Eleonore Chiquito MD Electronically signed by Eleonore Chiquito MD Signature Date/Time: 08/04/2020/4:32:33 PM    Final        Oswald Hillock   [facility] If 7PM-7AM, please contact night-coverage at [email or web address], Office  [phone]   08/05/2020, 1:14 PM  LOS: 2 days

## 2020-08-05 NOTE — ED Notes (Signed)
CBG=65. Pt is awake, alert, oriented x3. Skin w/d/pink. Sandwich with OJ given. Breakfast should arrive soon. Cont to monitor.

## 2020-08-05 NOTE — Consult Note (Addendum)
Cardiology Consultation:   Patient ID: [name] MRN: [id number]; DOB: 02/26/1946  Admit date: 08/03/2020 Date of Consult: 08/05/2020  Primary Care Provider: No primary care provider on file. [facility] Cardiologist: No primary care provider on file. new [place] Electrophysiologist:  None    Patient Profile:   [name] is a 75 y.o. female with a hx of DM-2, HTN, chronic back pain, restless leg syndrome no past cardiac hx admitted 08/03/20 for increasing dyspnea with fever, cough and hypoxia and COVID +  who is being seen today for the evaluation of new cardiomyopathy at the request of Dr. Darrick Meigs.  History of Present Illness:   [name] with above hx and no hx of CAD presented to ER 08/04/19 with dyspnea, cough and central chest pain, began as sinus infection.  The pain on admit radiated to Lt arm and neck (this is only noted in Triage nurse note).  She was found to be COVID-19 + and she desated to 75% on RA with ambulation.  With 02 sats up to 91%.    BNP was 531, then yesterday BNP to 1567.   LDH 222, troponin 65 no recheck.   lactic acid 3 WBC 11.4.  CRP 1.6 then 3.6 Ddimer 2.50   TSH 2.701   K+ 4.5 Cr 1.35  2 V CXR with  Interval diffusely increased interstitial prominence with central airway and fissural thickening, suspicious for interstitial edema. Progressive chronic interstitial lung disease considered less likely.  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  All similar to 2019 EKG including PVCs Telemetry:  Telemetry was personally reviewed and demonstrates:  SR Placed on IV remdesivir on dexamethasone 4 mg IV daily as well.   outpt meds for HTN include cardizem 240 mg daily, amlodipine 5, irbesartan 300 mg HCTZ 12.5 daily.     Echo yesterday with EF 20-25%, LV with global hypokinesis.  LV internal cavity size mildly to mod dilated.  G2DD.  RV size and function  normal.  Mildly elevated pulmonary artery systolic pressure.  Estimated RV systolic pressure ins [id number] mmHg  LA moderately dilated.  Mild MV regurg.   She rec'd one dose of Lasix yesterday 40 mg IV.    BP 112/72 to 106/73  P 81  No recent temps  Resp 16  sp02 on 1 L [place]ts of 89%   Past Medical History:  Diagnosis Date  . Arthritis   . Chronic back pain    buldging disc,scoliosis,arthritis  . Diabetes mellitus without complication ([place])    takes Trulicity,Jardiance,and Metformin daily.Average fasting blood sugar runs around130  . GERD (gastroesophageal reflux disease)    takes Omeprazole daily  . History of bronchitis > 8 yrs ago  . History of shingles   . HTN (hypertension)    takes Amlodipine and Micardis daily  . Hx of colonic polyps    benign  . Internal and external hemorrhoids without complication   . Joint pain   . Mitral valve prolapse   . Nocturia   . PONV (postoperative nausea and vomiting)    when gets injections in joints gets hives.Betadine rash  . Restless leg syndrome    takes Requip at bedtime  . Seasonal allergies    takes Claritin daily as needed  . Urinary frequency   . Uterine fibroid     Past Surgical History:  Procedure Laterality Date  .  BUNIONECTOMY Bilateral   . COLONOSCOPY  07/12/2004   diverticulosis, internal and external hemorrhoids  . COLONOSCOPY    . FINGER ARTHROSCOPY WITH CARPOMETACARPEL [facility]) ARTHROPLASTY Right 09/28/2015   Procedure: RIGHT THUMB TRAPEZIUM EXCISION WITH CARPOMETACARPEL ([place]) ARTHROPLASTY AND TENDON TRANSFER;  Surgeon: Iran Planas, MD;  Location: [place];  Service: Orthopedics;  Laterality: Right;  . HEMORRHOID SURGERY     almost 40 yrs ago  . HYSTEROSCOPY WITH D & C  08/23/2000   and resectoscopic myomectomy  . LUMBAR DISC SURGERY  03/16/2005  . LUMBAR EPIDURAL INJECTION    . PLANTAR FASCIA RELEASE Left 02/03/2010   and torn tendon  . TENDON TRANSFER Right 09/28/2015   Procedure: TENDON TRANSFER;  Surgeon: Iran Planas, MD;   Location: [place];  Service: Orthopedics;  Laterality: Right;  . TONSILLECTOMY    . TUBAL LIGATION    . WRIST SURGERY     left, removal of cyst     Home Medications:  Prior to Admission medications   Medication Sig Start Date End Date Taking? Authorizing Provider  allopurinol (ZYLOPRIM) 100 MG tablet Take 100 mg by mouth daily. 07/06/20  Yes [provider]  amLODipine (NORVASC) 5 MG tablet Take 5 mg by mouth daily.   Yes [provider]  aspirin 81 MG tablet Take 81 mg by mouth daily.   Yes [provider]  cholecalciferol (VITAMIN D) 1000 UNITS tablet Take 1,000 Units by mouth daily.   Yes [provider]  cycloSPORINE (RESTASIS) 0.05 % ophthalmic emulsion Place 1 drop into both eyes 2 (two) times daily.   Yes [provider]  diltiazem (CARDIZEM CD) 240 MG 24 hr capsule Take 240 mg by mouth daily. 07/19/20  Yes [provider]  Dulaglutide 0.75 MG/0.5ML SOPN Inject 0.75 mg into the skin once a week.   Yes [provider]  fluticasone (FLONASE) 50 MCG/ACT nasal spray Place 1 spray into both nostrils daily as needed for allergies. 01/11/20  Yes [provider]  glipiZIDE-metformin (METAGLIP) 5-500 MG tablet Take 1 tablet by mouth 2 (two) times daily before a meal.   Yes [provider]  ibuprofen (ADVIL,MOTRIN) 800 MG tablet Take 800 mg by mouth 2 (two) times daily as needed for moderate pain.   Yes [provider]  irbesartan-hydrochlorothiazide (AVALIDE) 300-12.5 MG tablet Take 1 tablet by mouth daily. 06/06/20  Yes [provider]  Magnesium 250 MG TABS Take 1 tablet by mouth daily.   Yes [provider]  Multiple Vitamin (MULTIVITAMIN WITH MINERALS) TABS tablet Take 1 tablet by mouth daily.   Yes [provider]  Multiple Vitamin (MULTIVITAMIN) capsule Take 1 capsule by mouth daily.   Yes [provider]  NUCYNTA 50 MG tablet Take 50 mg by mouth at bedtime. 06/09/20   Yes [provider]  omeprazole (PRILOSEC) 40 MG capsule Take 40 mg by mouth daily.   Yes [provider]  ondansetron (ZOFRAN) 4 MG tablet Take 1 tablet (4 mg total) by mouth every 8 (eight) hours as needed for nausea or vomiting. 09/28/15  Yes Iran Planas, MD  pregabalin (LYRICA) 100 MG capsule Take 100 mg by mouth 2 (two) times daily. 07/21/20  Yes [provider]  rOPINIRole (REQUIP) 2 MG tablet Take 2 mg by mouth at bedtime.  06/26/15  Yes [provider]  telmisartan-hydrochlorothiazide (MICARDIS HCT) 80-25 MG tablet Take 1 tablet by mouth daily.   Yes [provider]  omeprazole (PRILOSEC OTC) 20 MG tablet Take  1 tablet (20 mg total) by mouth daily. Patient not taking: Reported on 08/03/2020 05/25/11 08/14/14  Gatha Mayer, MD  famotidine (PEPCID) 20 MG tablet Take 1 tablet (20 mg total) by mouth 2 (two) times daily. Patient not taking: No sig reported 07/15/16 08/03/20  Larene Pickett, PA-C    Inpatient Medications: Scheduled Meds: . albuterol  2 puff Inhalation Q6H  . allopurinol  100 mg Oral Daily  . amLODipine  5 mg Oral Daily  . aspirin EC  81 mg Oral Daily  . cholecalciferol  1,000 Units Oral Daily  . cycloSPORINE  1 drop Both Eyes BID  . dexamethasone (DECADRON) injection  4 mg Intravenous Q24H  . diltiazem  240 mg Oral Daily  . docusate sodium  100 mg Oral BID  . enoxaparin (LOVENOX) injection  40 mg Subcutaneous Q24H  . glipiZIDE  5 mg Oral BID AC  . insulin aspart  0-5 Units Subcutaneous QHS  . insulin aspart  0-9 Units Subcutaneous TID WC  . insulin detemir  6 Units Subcutaneous BID  . linagliptin  5 mg Oral Daily  . magnesium oxide  400 mg Oral Daily  . multivitamin with minerals  1 tablet Oral Daily  . multivitamin with minerals  1 tablet Oral Daily  . pantoprazole (PROTONIX) IV  40 mg Intravenous Q24H  . pregabalin  100 mg Oral BID  . rOPINIRole  2 mg Oral QHS  . senna  1 tablet Oral BID  . zinc sulfate  220 mg Oral  Daily   Continuous Infusions: . remdesivir 100 mg in NS 100 mL Stopped (08/04/20 1317)   PRN Meds: acetaminophen **OR** acetaminophen, diphenhydrAMINE, fluticasone, guaiFENesin-dextromethorphan, hydrALAZINE, ondansetron **OR** ondansetron (ZOFRAN) IV, polyethylene glycol, zolpidem  Allergies:    Allergies  Allergen Reactions  . Amlodipine Swelling  . Depo-Medrol [Methylprednisolone] Hives  . Gabapentin Rash    hives   . Levothyroxine Other (See Comments)    severe muscle pain in neck    . Simvastatin Rash  . Sitagliptin Other (See Comments)    Urinary hesitancy  . Ace Inhibitors     cough  . Betadine [Povidone Iodine]     rash  . Codeine Nausea And Vomiting  . Cortizone-10 [Hydrocortisone] Other (See Comments)    unknown  . Tetracyclines & Related Nausea And Vomiting    Social History:   Social History   Socioeconomic History  . Marital status: Married    Spouse name: Not on file  . Number of children: 3  . Years of education: Not on file  . Highest education level: Not on file  Occupational History  . Occupation: Retired   Tobacco Use  . Smoking status: Never Smoker  . Smokeless tobacco: Never Used  Substance and Sexual Activity  . Alcohol use: No    Alcohol/week: 0.0 standard drinks  . Drug use: No  . Sexual activity: Yes  Other Topics Concern  . Not on file  Social History Narrative   1 caffeine drinks daily         Social Determinants of Health   Financial Resource Strain: Not on file  Food Insecurity: Not on file  Transportation Needs: Not on file  Physical Activity: Not on file  Stress: Not on file  Social Connections: Not on file  Intimate Partner Violence: Not on file    Family History:    Family History  Problem Relation Age of Onset  . Pancreatic cancer Father   . Colon cancer Neg Hx   .  Breast cancer Neg Hx      ROS:  Please see the history of present illness.  General:+ colds or fevers, no weight changes Skin:no rashes or  ulcers HEENT:no blurred vision, + congestion CV:see HPI PUL:see HPI GI:no diarrhea constipation or melena, no indigestion GU:no hematuria, no dysuria MS:no joint pain, no claudication Neuro:no syncope, no lightheadedness Endo:+ diabetes, no thyroid disease  All other ROS reviewed and negative.     Physical Exam/Data:   Vitals:   08/05/20 0245 08/05/20 0300 08/05/20 0400 08/05/20 0600  BP:  (!) 114/93 112/72 106/73  Pulse: 68 (!) 101 72 88  Resp:  18 16 16   Temp:      TempSrc:      SpO2: 97% 91% 95% 98%  Weight:      Height:        Intake/Output Summary (Last 24 hours) at 08/05/2020 0750 Last data filed at 08/04/2020 1317 Gross per 24 hour  Intake 100 ml  Output --  Net 100 ml   Last 3 Weights 08/03/2020 07/04/2018 07/15/2016  Weight (lbs) 194 lb 0.1 oz 193 lb 190 lb  Weight (kg) 88 kg 87.544 kg 86.183 kg     Body mass index is 32.28 kg/m.  Exam per Dr. Johney Frame  Relevant CV Studies: TTE 08/04/20 IMPRESSIONS    1. Left ventricular ejection fraction, by estimation, is 20 to 25%. The  left ventricle has severely decreased function. The left ventricle  demonstrates global hypokinesis. The left ventricular internal cavity size  was mildly to moderately dilated. Left  ventricular diastolic parameters are consistent with Grade II diastolic  dysfunction (pseudonormalization). Elevated left atrial pressure.  2. Right ventricular systolic function is normal. The right ventricular  size is normal. There is mildly elevated pulmonary artery systolic  pressure. The estimated right ventricular systolic pressure is [id number] mmHg.  3. Left atrial size was moderately dilated.  4. The mitral valve is degenerative. Mild mitral valve regurgitation. No  evidence of mitral stenosis.  5. The aortic valve is tricuspid. There is mild calcification of the  aortic valve. There is mild thickening of the aortic valve. Aortic valve  regurgitation is not visualized. Mild aortic valve sclerosis  is present,  with no evidence of aortic valve  stenosis.  6. The inferior vena cava is dilated in size with <50% respiratory  variability, suggesting right atrial pressure of 15 mmHg.   FINDINGS  Left Ventricle: Left ventricular ejection fraction, by estimation, is 20  to 25%. The left ventricle has severely decreased function. The left  ventricle demonstrates global hypokinesis. The left ventricular internal  cavity size was mildly to moderately  dilated. There is no left ventricular hypertrophy. Left ventricular  diastolic parameters are consistent with Grade II diastolic dysfunction  (pseudonormalization). Elevated left atrial pressure.   Right Ventricle: The right ventricular size is normal. No increase in  right ventricular wall thickness. Right ventricular systolic function is  normal. There is mildly elevated pulmonary artery systolic pressure. The  tricuspid regurgitant velocity is 2.39  m/s, and with an assumed right atrial pressure of 15 mmHg, the estimated  right ventricular systolic pressure is [id number] mmHg.   Left Atrium: Left atrial size was moderately dilated.   Right Atrium: Right atrial size was normal in size.   Pericardium: Trivial pericardial effusion is present.   Mitral Valve: The mitral valve is degenerative in appearance. Mild mitral  annular calcification. Mild mitral valve regurgitation. No evidence of  mitral valve stenosis.   Tricuspid Valve: The  tricuspid valve is grossly normal. Tricuspid valve  regurgitation is mild . No evidence of tricuspid stenosis.   Aortic Valve: The aortic valve is tricuspid. There is mild calcification  of the aortic valve. There is mild thickening of the aortic valve. Aortic  valve regurgitation is not visualized. Mild aortic valve sclerosis is  present, with no evidence of aortic  valve stenosis.   Pulmonic Valve: The pulmonic valve was grossly normal. Pulmonic valve  regurgitation is trivial. No evidence of pulmonic  stenosis.   Aorta: The aortic root and ascending aorta are structurally normal, with  no evidence of dilitation.   Venous: The inferior vena cava is dilated in size with less than 50%  respiratory variability, suggesting right atrial pressure of 15 mmHg.   IAS/Shunts: The atrial septum is grossly normal.     LEFT VENTRICLE  PLAX 2D  LVIDd:     5.70 cm  LVIDs:     4.90 cm  LV PW:     1.10 cm  LV IVS:    0.80 cm  LVOT diam:   1.80 cm  LV SV:     43  LV SV Index:  22  LVOT Area:   2.54 cm     RIGHT VENTRICLE       IVC  RV Basal diam: 2.90 cm   IVC diam: 2.30 cm  RV S prime:   12.30 cm/s  TAPSE (M-mode): 1.9 cm   LEFT ATRIUM       Index    RIGHT ATRIUM      Index  LA diam:    5.10 cm 2.61 cm/m RA Area:   14.20 cm  LA Vol (A2C):  89.8 ml 45.99 ml/m RA Volume:  35.50 ml 18.18 ml/m  LA Vol (A4C):  83.8 ml 42.91 ml/m  LA Biplane Vol: 88.6 ml 45.37 ml/m  AORTIC VALVE  LVOT Vmax:  70.10 cm/s  LVOT Vmean: 51.500 cm/s  LVOT VTI:  0.168 m    AORTA  Ao Root diam: 3.00 cm  Ao Asc diam: 3.10 cm   MITRAL VALVE        TRICUSPID VALVE  MV Area (PHT): 3.65 cm   TR Peak grad:  22.8 mmHg  MV Decel Time: 208 msec   TR Vmax:    239.00 cm/s  MV E velocity: 118.00 cm/s  MV A velocity: 86.60 cm/s  SHUNTS  MV E/A ratio: 1.36     Systemic VTI: 0.17 m               Systemic Diam: 1.80 cm   Laboratory Data:  High Sensitivity Troponin:   Recent Labs  Lab 08/03/20 1357  TROPONINIHS 66*  65*     Chemistry Recent Labs  Lab 08/03/20 1357 08/04/20 0255  NA 138 136  K 4.0 4.5  CL 101 103  CO2 22 23  GLUCOSE 204* 230*  BUN 23 32*  CREATININE 1.10* 1.35*  CALCIUM 9.9 9.3  GFRNONAA 53* 41*  ANIONGAP 15 10    Recent Labs  Lab 08/03/20 1357  PROT 7.4  ALBUMIN 4.2  AST 26  ALT 28  ALKPHOS 107  BILITOT 1.0   Hematology Recent Labs  Lab 08/03/20 1357  08/04/20 0255  WBC 11.4* 5.9  RBC 4.41 3.76*  HGB 13.1 11.3*  HCT 40.1 33.8*  MCV 90.9 89.9  MCH 29.7 30.1  MCHC 32.7 33.4  RDW 13.8 14.0  PLT 292 254   BNP Recent Labs  Lab 08/03/20 1357 08/04/20  0255  BNP 530.6* 1,567.5*    DDimer  Recent Labs  Lab 08/03/20 1357 08/04/20 0255  DDIMER 2.50* 1.90*     Radiology/Studies:  DG Chest 2 View  Result Date: 08/03/2020 CLINICAL DATA:  Increasing shortness of breath, cough and central chest pain for 1 week. Sinus infection 2 weeks ago. EXAM: CHEST - 2 VIEW COMPARISON:  Radiographs 07/04/2018 and 06/29/2009. FINDINGS: The heart size and mediastinal contours are stable. There is mild aortic atherosclerosis. Interval diffusely increased interstitial prominence with central airway and fissural thickening. No confluent airspace opacity, significant pleural effusion or pneumothorax. There are mild degenerative changes within the thoracic spine. IMPRESSION: Interval diffusely increased interstitial prominence with central airway and fissural thickening, suspicious for interstitial edema. Progressive chronic interstitial lung disease considered less likely. Suggest short-term radiographic follow-up. Electronically Signed   By: Carey Bullocks M.D.   On: 08/03/2020 12:23   ECHOCARDIOGRAM COMPLETE  Result Date: 08/04/2020    ECHOCARDIOGRAM REPORT   Patient Name:   [name] Date of Exam: 08/04/2020 Medical Rec #:  [id number]      Height:       65.0 in Accession #:    [id number]     Weight:       194.0 lb Date of Birth:  Oct 02, 1945      BSA:          1.953 m Patient Age:    74 years       BP:           127/85 mmHg Patient Gender: F              HR:           85 bpm. Exam Location:  Inpatient Procedure: 2D Echo, Cardiac Doppler and Color Doppler Indications:    R94.31 Abnormal EKG  History:        Patient has no prior history of Echocardiogram examinations.                 Mitral Valve Prolapse; Risk Factors:Diabetes, Hypertension and                  GERD. COVID-19 Positive.  Sonographer:    Tiffany Dance Referring Phys: [id number] BINAYA DAHAL IMPRESSIONS  1. Left ventricular ejection fraction, by estimation, is 20 to 25%. The left ventricle has severely decreased function. The left ventricle demonstrates global hypokinesis. The left ventricular internal cavity size was mildly to moderately dilated. Left ventricular diastolic parameters are consistent with Grade II diastolic dysfunction (pseudonormalization). Elevated left atrial pressure.  2. Right ventricular systolic function is normal. The right ventricular size is normal. There is mildly elevated pulmonary artery systolic pressure. The estimated right ventricular systolic pressure is 37.8 mmHg.  3. Left atrial size was moderately dilated.  4. The mitral valve is degenerative. Mild mitral valve regurgitation. No evidence of mitral stenosis.  5. The aortic valve is tricuspid. There is mild calcification of the aortic valve. There is mild thickening of the aortic valve. Aortic valve regurgitation is not visualized. Mild aortic valve sclerosis is present, with no evidence of aortic valve stenosis.  6. The inferior vena cava is dilated in size with <50% respiratory variability, suggesting right atrial pressure of 15 mmHg. FINDINGS  Left Ventricle: Left ventricular ejection fraction, by estimation, is 20 to 25%. The left ventricle has severely decreased function. The left ventricle demonstrates global hypokinesis. The left ventricular internal cavity size was mildly to moderately dilated. There is no left ventricular hypertrophy. Left  ventricular diastolic parameters are consistent with Grade II diastolic dysfunction (pseudonormalization). Elevated left atrial pressure. Right Ventricle: The right ventricular size is normal. No increase in right ventricular wall thickness. Right ventricular systolic function is normal. There is mildly elevated pulmonary artery systolic pressure. The tricuspid regurgitant velocity is  2.39  m/s, and with an assumed right atrial pressure of 15 mmHg, the estimated right ventricular systolic pressure is [id number] mmHg. Left Atrium: Left atrial size was moderately dilated. Right Atrium: Right atrial size was normal in size. Pericardium: Trivial pericardial effusion is present. Mitral Valve: The mitral valve is degenerative in appearance. Mild mitral annular calcification. Mild mitral valve regurgitation. No evidence of mitral valve stenosis. Tricuspid Valve: The tricuspid valve is grossly normal. Tricuspid valve regurgitation is mild . No evidence of tricuspid stenosis. Aortic Valve: The aortic valve is tricuspid. There is mild calcification of the aortic valve. There is mild thickening of the aortic valve. Aortic valve regurgitation is not visualized. Mild aortic valve sclerosis is present, with no evidence of aortic valve stenosis. Pulmonic Valve: The pulmonic valve was grossly normal. Pulmonic valve regurgitation is trivial. No evidence of pulmonic stenosis. Aorta: The aortic root and ascending aorta are structurally normal, with no evidence of dilitation. Venous: The inferior vena cava is dilated in size with less than 50% respiratory variability, suggesting right atrial pressure of 15 mmHg. IAS/Shunts: The atrial septum is grossly normal.  LEFT VENTRICLE PLAX 2D LVIDd:         5.70 cm LVIDs:         4.90 cm LV PW:         1.10 cm LV IVS:        0.80 cm LVOT diam:     1.80 cm LV SV:         43 LV SV Index:   22 LVOT Area:     2.54 cm  RIGHT VENTRICLE             IVC RV Basal diam:  2.90 cm     IVC diam: 2.30 cm RV S prime:     12.30 cm/s TAPSE (M-mode): 1.9 cm LEFT ATRIUM             Index       RIGHT ATRIUM           Index LA diam:        5.10 cm 2.61 cm/m  RA Area:     14.20 cm LA Vol (A2C):   89.8 ml 45.99 ml/m RA Volume:   35.50 ml  18.18 ml/m LA Vol (A4C):   83.8 ml 42.91 ml/m LA Biplane Vol: 88.6 ml 45.37 ml/m  AORTIC VALVE LVOT Vmax:   70.10 cm/s LVOT Vmean:  51.500 cm/s LVOT VTI:     0.168 m  AORTA Ao Root diam: 3.00 cm Ao Asc diam:  3.10 cm MITRAL VALVE                TRICUSPID VALVE MV Area (PHT): 3.65 cm     TR Peak grad:   22.8 mmHg MV Decel Time: 208 msec     TR Vmax:        239.00 cm/s MV E velocity: 118.00 cm/s MV A velocity: 86.60 cm/s   SHUNTS MV E/A ratio:  1.36         Systemic VTI:  0.17 m  Systemic Diam: 1.80 cm Eleonore Chiquito MD Electronically signed by Eleonore Chiquito MD Signature Date/Time: 08/04/2020/4:32:33 PM    Final      Assessment and Plan:   1. New Cardiomyopathy, EF 20-25%, RN noted chest pain on arrival, troponin 65 may all be due to COVID but will check another troponin, also pt on amlodipine and cardiazem, with low EF will stop dilt and add coreg and thought to add back ARB ( stopped due to bump in Cr )  vs entresto.  BP is soft currently.  Would stop amlodipine to resume ARB/Entresto  Currently labs in process.    Had one dose of lasix yesterday labs pending today and no I&O 2. No hx of CAD. Would do cardiac CTA once COVID resolved unless she has recurrent chest pain then cath. 3. DM-2 with A1c of 6.6 this admit.  outpt meds Metaglip,  Add farxiga prior to discharge.   4. HTN currently soft see above for med changes.   5. Elevated troponin recheck today.  May be demand ischemia from COVID 19 and Hypoxia 6. COVID-19 and hypoxia. Per IM        New York Heart Association (NYHA) Functional Class NYHA Class III        For questions or updates, please contact [facility] Please consult [email or web address] for contact info under    Signed, Cecilie Kicks, NP  08/05/2020 7:50 AM   Patient seen and examined and agree with Cecilie Kicks, NP as detailed above.  In brief, the patient is a 75 y.o. female with a hx of DM-2, HTN, chronic back pain, restless leg syndrome no past cardiac hx admitted 08/03/20 for increasing dyspnea with fever, cough and hypoxia and COVID +  who is being seen today for the evaluation of new systolic heart  failure.  TTE which was personally reviewed by me shows LVEF 20-25% with global hypokinesis. Patient denies any known cardiac history. Trop flat at 60 with low suspicion of acute ACS. Etiology unclear. Possibly related to COVID-19 infection vs possible PVC related cardiomyopathy vs ischemic heart disease. Fortunately, patient is clinically stable and euvolemic on examination. Will need ischemic evaluation as out-patient as well as monitor on discharge. Will optimize medically while recovering from COVID-19 infection.  Exam: GEN: No acute distress.  Sitting comfortably in bed Jolley in place Neck: No JVD Cardiac: RRR, no murmurs, rubs, or gallops.  Respiratory: Clear to auscultation bilaterally. GI: Soft, nontender, non-distended  MS: No edema; No deformity. Neuro:  Nonfocal  Psych: Normal affect   Plan: -Will need ischemic work-up (likely cath vs coronary CTA) as out-patient. Does not need emergent work-up while in-patient due to overall clinical stability, COVID-19 positive status, and lack of ACS -Will need long-term monitor on discharge to monitor burden of PVCs and for possible Afib given patient's history of frequent palpitations -Clinically euvolemic; can re-dose diuretic as needed -Stop cardizem and start coreg 3.125mg  BID -Once renal function back to baseline, will start low dose entresto and spironolactone -Would benefit from SGLT-2 inhibitor given DMII and HFrEF; add prior to discharge -Management of COVID-19 infection per primary team  Gwyndolyn Kaufman, MD

## 2020-08-05 NOTE — ED Notes (Signed)
ED TO INPATIENT HANDOFF REPORT  ED Nurse Name and Phone #: [phone]  S Name/Age/Gender [name] 75 y.o. female Room/Bed: WA16/WA16  Code Status   Code Status: Full Code  Home/SNF/Other Home Patient oriented to: self, place, time and situation Is this baseline? Yes   Triage Complete: Triage complete  Chief Complaint Pneumonia due to COVID-19 virus [U07.1, J12.82]  Triage Note Patient reports having a sinus infection 2 weeks ago (took amoxicillin), has been SOB, cough and having central chest pain x1 week, notices she is SOB when walking up stairs. Chest pain moves up her L arm/ neck area. Tachypnea and tachycardic in triage. Denies N/V/D, fevers.     Allergies Allergies  Allergen Reactions  . Amlodipine Swelling  . Depo-Medrol [Methylprednisolone] Hives  . Gabapentin Rash    hives   . Levothyroxine Other (See Comments)    severe muscle pain in neck    . Simvastatin Rash  . Sitagliptin Other (See Comments)    Urinary hesitancy  . Ace Inhibitors     cough  . Betadine [Povidone Iodine]     rash  . Codeine Nausea And Vomiting  . Cortizone-10 [Hydrocortisone] Other (See Comments)    unknown  . Tetracyclines & Related Nausea And Vomiting  . Dexamethasone Rash    Level of Care/Admitting Diagnosis ED Disposition    ED Disposition Condition [facility] Area: [place] [[id number]]  Level of Care: Progressive [102]  Admit to Progressive based on following criteria: RESPIRATORY PROBLEMS hypoxemic/hypercapnic respiratory failure that is responsive to NIPPV (BiPAP) or High Flow Nasal Cannula (6-80 lpm). Frequent assessment/intervention, no > Q2 hrs < Q4 hrs, to maintain oxygenation and pulmonary hygiene.  May [facility] patient to [facility] or [facility] if equivalent level of care is available:: Yes  Covid Evaluation: Confirmed COVID Positive  Diagnosis: Pneumonia due to COVID-19 virus [id number]  Admitting Physician: Terrilee Croak  [id number]  Attending Physician: Terrilee Croak [id number]  Estimated length of stay: past midnight tomorrow  Certification:: I certify this patient will need inpatient services for at least 2 midnights       B Medical/Surgery History Past Medical History:  Diagnosis Date  . Arthritis   . Chronic back pain    buldging disc,scoliosis,arthritis  . Diabetes mellitus without complication ([place])    takes Trulicity,Jardiance,and Metformin daily.Average fasting blood sugar runs around130  . GERD (gastroesophageal reflux disease)    takes Omeprazole daily  . History of bronchitis > 8 yrs ago  . History of shingles   . HTN (hypertension)    takes Amlodipine and Micardis daily  . Hx of colonic polyps    benign  . Internal and external hemorrhoids without complication   . Joint pain   . Mitral valve prolapse   . Nocturia   . PONV (postoperative nausea and vomiting)    when gets injections in joints gets hives.Betadine rash  . Restless leg syndrome    takes Requip at bedtime  . Seasonal allergies    takes Claritin daily as needed  . Urinary frequency   . Uterine fibroid    Past Surgical History:  Procedure Laterality Date  . BUNIONECTOMY Bilateral   . COLONOSCOPY  07/12/2004   diverticulosis, internal and external hemorrhoids  . COLONOSCOPY    . FINGER ARTHROSCOPY WITH CARPOMETACARPEL [facility]) ARTHROPLASTY Right 09/28/2015   Procedure: RIGHT THUMB TRAPEZIUM EXCISION WITH CARPOMETACARPEL ([place]) ARTHROPLASTY AND TENDON TRANSFER;  Surgeon: Iran Planas, MD;  Location: [facility];  Service: Orthopedics;  Laterality: Right;  . HEMORRHOID SURGERY     almost 40 yrs ago  . HYSTEROSCOPY WITH D & C  08/23/2000   and resectoscopic myomectomy  . LUMBAR DISC SURGERY  03/16/2005  . LUMBAR EPIDURAL INJECTION    . PLANTAR FASCIA RELEASE Left 02/03/2010   and torn tendon  . TENDON TRANSFER Right 09/28/2015   Procedure: TENDON TRANSFER;  Surgeon: Iran Planas, MD;  Location: [facility];  Service: Orthopedics;   Laterality: Right;  . TONSILLECTOMY    . TUBAL LIGATION    . WRIST SURGERY     left, removal of cyst     A IV Location/Drains/Wounds Patient Lines/Drains/Airways Status    Active Line/Drains/Airways    Name Placement date Placement time Site Days   Peripheral IV 08/03/20 Anterior;Right Forearm 08/03/20  1430  Forearm  2   Airway 09/28/15  1531  -- 1773   Incision (Closed) 09/28/15 Arm Right 09/28/15  1545  -- 1773          Intake/Output Last 24 hours No intake or output data in the 24 hours ending 08/05/20 1839  Labs/Imaging Results for orders placed or performed during the hospital encounter of 08/03/20 (from the past 48 hour(s))  CBG monitoring, ED     Status: Abnormal   Collection Time: 08/03/20  8:09 PM  Result Value Ref Range   Glucose-Capillary 252 (H) 70 - 99 mg/dL    Comment: Glucose reference range applies only to samples taken after fasting for at least 8 hours.  CBG monitoring, ED     Status: Abnormal   Collection Time: 08/03/20 10:18 PM  Result Value Ref Range   Glucose-Capillary 233 (H) 70 - 99 mg/dL    Comment: Glucose reference range applies only to samples taken after fasting for at least 8 hours.  C-reactive protein     Status: Abnormal   Collection Time: 08/04/20  2:55 AM  Result Value Ref Range   CRP 3.6 (H) <1.0 mg/dL    Comment: Performed at [facility], [place] [employer] [place] [address], [place], [place] [address]  D-dimer, quantitative (not at [facility])     Status: Abnormal   Collection Time: 08/04/20  2:55 AM  Result Value Ref Range   D-Dimer, Quant 1.90 (H) 0.00 - 0.50 ug/mL-FEU    Comment: (NOTE) At the manufacturer cut-off value of 0.5 g/mL FEU, this assay has a negative predictive value of 95-100%.This assay is intended for use in conjunction with a clinical pretest probability (PTP) assessment model to exclude pulmonary embolism (PE) and deep venous thrombosis (DVT) in outpatients suspected of PE or DVT. Results should be correlated with  clinical presentation. Performed at [facility], [place] [address], [place], [place] [address]   Ferritin     Status: None   Collection Time: 08/04/20  2:55 AM  Result Value Ref Range   Ferritin 52 11 - 307 ng/mL    Comment: Performed at [facility], [place] [address], [place], [place] [address]  Lactic acid, plasma     Status: None   Collection Time: 08/04/20  2:55 AM  Result Value Ref Range   Lactic Acid, Venous 1.0 0.5 - 1.9 mmol/L    Comment: Performed at [facility], [place] [address], [place], [place] [address]  Brain natriuretic peptide     Status: Abnormal   Collection Time: 08/04/20  2:55 AM  Result Value Ref Range   B Natriuretic Peptide 1,567.5 (H) 0.0 - 100.0  pg/mL    Comment: Performed at [facility], [place] [address] [place], [place] [id number]  Basic metabolic panel     Status: Abnormal   Collection Time: 08/04/20  2:55 AM  Result Value Ref Range   Sodium 136 135 - 145 mmol/L   Potassium 4.5 3.5 - 5.1 mmol/L   Chloride 103 98 - 111 mmol/L   CO2 23 22 - 32 mmol/L   Glucose, Bld 230 (H) 70 - 99 mg/dL    Comment: Glucose reference range applies only to samples taken after fasting for at least 8 hours.   BUN 32 (H) 8 - 23 mg/dL   Creatinine, Ser 1.35 (H) 0.44 - 1.00 mg/dL   Calcium 9.3 8.9 - 10.3 mg/dL   GFR, Estimated 41 (L) >60 mL/min    Comment: (NOTE) Calculated using the CKD-EPI Creatinine Equation (2021)    Anion gap 10 5 - 15    Comment: Performed at [facility], [place] [address] [place], [place] [address]  CBC     Status: Abnormal   Collection Time: 08/04/20  2:55 AM  Result Value Ref Range   WBC 5.9 4.0 - 10.5 K/uL   RBC 3.76 (L) 3.87 - 5.11 MIL/uL   Hemoglobin 11.3 (L) 12.0 - 15.0 g/dL   HCT 33.8 (L) 36.0 - 46.0 %   MCV 89.9 80.0 - 100.0 fL   MCH 30.1 26.0 - 34.0 pg   MCHC 33.4 30.0 - 36.0 g/dL   RDW 14.0 11.5 - 15.5 %   Platelets 254 150 - 400 K/uL   nRBC 0.0  0.0 - 0.2 %    Comment: Performed at [facility], [place] [address] [place], [place] [address]  CBG monitoring, ED     Status: Abnormal   Collection Time: 08/04/20  7:43 AM  Result Value Ref Range   Glucose-Capillary 224 (H) 70 - 99 mg/dL    Comment: Glucose reference range applies only to samples taken after fasting for at least 8 hours.   Comment 1 Notify RN   CBG monitoring, ED     Status: Abnormal   Collection Time: 08/04/20 11:56 AM  Result Value Ref Range   Glucose-Capillary 283 (H) 70 - 99 mg/dL    Comment: Glucose reference range applies only to samples taken after fasting for at least 8 hours.  CBG monitoring, ED     Status: Abnormal   Collection Time: 08/04/20  4:57 PM  Result Value Ref Range   Glucose-Capillary 215 (H) 70 - 99 mg/dL    Comment: Glucose reference range applies only to samples taken after fasting for at least 8 hours.  CBG monitoring, ED     Status: Abnormal   Collection Time: 08/04/20 10:44 PM  Result Value Ref Range   Glucose-Capillary 145 (H) 70 - 99 mg/dL    Comment: Glucose reference range applies only to samples taken after fasting for at least 8 hours.  CBG monitoring, ED     Status: Abnormal   Collection Time: 08/05/20  7:44 AM  Result Value Ref Range   Glucose-Capillary 141 (H) 70 - 99 mg/dL    Comment: Glucose reference range applies only to samples taken after fasting for at least 8 hours.  Troponin I (High Sensitivity)     Status: Abnormal   Collection Time: 08/05/20 10:48 AM  Result Value Ref Range   Troponin I (High Sensitivity) 125 (HH) <18 ng/L    Comment: DELTA CHECK NOTED CRITICAL RESULT CALLED  TO, READ BACK BY AND VERIFIED WITH: FARRAR,S. RN AT 1130 08/05/20 MULLINS,T (NOTE) Elevated high sensitivity troponin I (hsTnI) values and significant  changes across serial measurements may suggest ACS but many other  chronic and acute conditions are known to elevate hsTnI results.  Refer to the Links section for chest pain  algorithms and additional  guidance. Performed at [facility], [place] [address] [place], [place] [address]   CBG monitoring, ED     Status: Abnormal   Collection Time: 08/05/20 11:53 AM  Result Value Ref Range   Glucose-Capillary 111 (H) 70 - 99 mg/dL    Comment: Glucose reference range applies only to samples taken after fasting for at least 8 hours.  CBG monitoring, ED     Status: Abnormal   Collection Time: 08/05/20  4:44 PM  Result Value Ref Range   Glucose-Capillary 103 (H) 70 - 99 mg/dL    Comment: Glucose reference range applies only to samples taken after fasting for at least 8 hours.   ECHOCARDIOGRAM COMPLETE  Result Date: 08/04/2020    ECHOCARDIOGRAM REPORT   Patient Name:   [name] Date of Exam: 08/04/2020 Medical Rec #:  [id number]      Height:       65.0 in Accession #:    [id number]     Weight:       194.0 lb Date of Birth:  07-17-1946      BSA:          1.953 m Patient Age:    65 years       BP:           127/85 mmHg Patient Gender: F              HR:           85 bpm. Exam Location:  Inpatient Procedure: 2D Echo, Cardiac Doppler and Color Doppler Indications:    R94.31 Abnormal EKG  History:        Patient has no prior history of Echocardiogram examinations.                 Mitral Valve Prolapse; Risk Factors:Diabetes, Hypertension and                 GERD. COVID-19 Positive.  Sonographer:    Tiffany Dance Referring Phys: [id number] [place]  1. Left ventricular ejection fraction, by estimation, is 20 to 25%. The left ventricle has severely decreased function. The left ventricle demonstrates global hypokinesis. The left ventricular internal cavity size was mildly to moderately dilated. Left ventricular diastolic parameters are consistent with Grade II diastolic dysfunction (pseudonormalization). Elevated left atrial pressure.  2. Right ventricular systolic function is normal. The right ventricular size is normal. There is mildly elevated pulmonary  artery systolic pressure. The estimated right ventricular systolic pressure is [id number].  3. Left atrial size was moderately dilated.  4. The mitral valve is degenerative. Mild mitral valve regurgitation. No evidence of mitral stenosis.  5. The aortic valve is tricuspid. There is mild calcification of the aortic valve. There is mild thickening of the aortic valve. Aortic valve regurgitation is not visualized. Mild aortic valve sclerosis is present, with no evidence of aortic valve stenosis.  6. The inferior vena cava is dilated in size with <50% respiratory variability, suggesting right atrial pressure of 15 mmHg. FINDINGS  Left Ventricle: Left ventricular ejection fraction, by estimation, is 20 to 25%. The left ventricle has severely decreased function. The  left ventricle demonstrates global hypokinesis. The left ventricular internal cavity size was mildly to moderately dilated. There is no left ventricular hypertrophy. Left ventricular diastolic parameters are consistent with Grade II diastolic dysfunction (pseudonormalization). Elevated left atrial pressure. Right Ventricle: The right ventricular size is normal. No increase in right ventricular wall thickness. Right ventricular systolic function is normal. There is mildly elevated pulmonary artery systolic pressure. The tricuspid regurgitant velocity is 2.39  m/s, and with an assumed right atrial pressure of 15 mmHg, the estimated right ventricular systolic pressure is [id number] mmHg. Left Atrium: Left atrial size was moderately dilated. Right Atrium: Right atrial size was normal in size. Pericardium: Trivial pericardial effusion is present. Mitral Valve: The mitral valve is degenerative in appearance. Mild mitral annular calcification. Mild mitral valve regurgitation. No evidence of mitral valve stenosis. Tricuspid Valve: The tricuspid valve is grossly normal. Tricuspid valve regurgitation is mild . No evidence of tricuspid stenosis. Aortic Valve: The aortic valve  is tricuspid. There is mild calcification of the aortic valve. There is mild thickening of the aortic valve. Aortic valve regurgitation is not visualized. Mild aortic valve sclerosis is present, with no evidence of aortic valve stenosis. Pulmonic Valve: The pulmonic valve was grossly normal. Pulmonic valve regurgitation is trivial. No evidence of pulmonic stenosis. Aorta: The aortic root and ascending aorta are structurally normal, with no evidence of dilitation. Venous: The inferior vena cava is dilated in size with less than 50% respiratory variability, suggesting right atrial pressure of 15 mmHg. IAS/Shunts: The atrial septum is grossly normal.  LEFT VENTRICLE PLAX 2D LVIDd:         5.70 cm LVIDs:         4.90 cm LV PW:         1.10 cm LV IVS:        0.80 cm LVOT diam:     1.80 cm LV SV:         43 LV SV Index:   22 LVOT Area:     2.54 cm  RIGHT VENTRICLE             IVC RV Basal diam:  2.90 cm     IVC diam: 2.30 cm RV S prime:     12.30 cm/s TAPSE (M-mode): 1.9 cm LEFT ATRIUM             Index       RIGHT ATRIUM           Index LA diam:        5.10 cm 2.61 cm/m  RA Area:     14.20 cm LA Vol (A2C):   89.8 ml 45.99 ml/m RA Volume:   35.50 ml  18.18 ml/m LA Vol (A4C):   83.8 ml 42.91 ml/m LA Biplane Vol: 88.6 ml 45.37 ml/m  AORTIC VALVE LVOT Vmax:   70.10 cm/s LVOT Vmean:  51.500 cm/s LVOT VTI:    0.168 m  AORTA Ao Root diam: 3.00 cm Ao Asc diam:  3.10 cm MITRAL VALVE                TRICUSPID VALVE MV Area (PHT): 3.65 cm     TR Peak grad:   22.8 mmHg MV Decel Time: 208 msec     TR Vmax:        239.00 cm/s MV E velocity: 118.00 cm/s MV A velocity: 86.60 cm/s   SHUNTS MV E/A ratio:  1.36         Systemic VTI:  0.17 m  Systemic Diam: 1.80 cm Lennie Odor MD Electronically signed by Lennie Odor MD Signature Date/Time: 08/04/2020/4:32:33 PM    Final     Pending Labs Unresulted Labs (From admission, onward)          Start     Ordered   08/10/20 0500  Creatinine, serum   (enoxaparin (LOVENOX)    CrCl >/= 30 ml/min)  Weekly,   R     Comments: while on enoxaparin therapy    08/03/20 1655   08/05/20 0500  Brain natriuretic peptide  Daily,   R      08/04/20 1425   08/04/20 0500  Basic metabolic panel  Daily,   R      08/03/20 1655   08/04/20 0500  CBC  Daily,   R      08/03/20 1655   08/04/20 0500  C-reactive protein  Daily,   R      08/03/20 1658   08/04/20 0500  D-dimer, quantitative (not at [facility])  Daily,   R      08/03/20 1658   08/04/20 0500  Ferritin  Daily,   R      08/03/20 1658          Vitals/Pain Today's Vitals   08/05/20 1312 08/05/20 1316 08/05/20 1655 08/05/20 1656  BP: 131/84  (!) 125/95 (!) 125/95  Pulse: 85  71 77  Resp: 16   16  Temp:      TempSrc:      SpO2: 100%   100%  Weight:      Height:      PainSc:  0-No pain      Isolation Precautions Airborne and Contact precautions  Medications Medications  remdesivir 200 mg in sodium chloride 0.9% 250 mL IVPB (0 mg Intravenous Stopped 08/03/20 2033)    Followed by  remdesivir 100 mg in sodium chloride 0.9 % 100 mL IVPB (0 mg Intravenous Stopped 08/05/20 1837)  acetaminophen (TYLENOL) tablet 650 mg (has no administration in time range)    Or  acetaminophen (TYLENOL) suppository 650 mg (has no administration in time range)  hydrALAZINE (APRESOLINE) injection 10 mg (has no administration in time range)  enoxaparin (LOVENOX) injection 40 mg (40 mg Subcutaneous Given 08/04/20 2253)  zolpidem (AMBIEN) tablet 5 mg (5 mg Oral Given 08/05/20 0044)  docusate sodium (COLACE) capsule 100 mg (100 mg Oral Given 08/05/20 1032)  senna (SENOKOT) tablet 8.6 mg (8.6 mg Oral Given 08/05/20 1031)  polyethylene glycol (MIRALAX / GLYCOLAX) packet 17 g (has no administration in time range)  ondansetron (ZOFRAN) tablet 4 mg (has no administration in time range)    Or  ondansetron (ZOFRAN) injection 4 mg (has no administration in time range)  insulin aspart (novoLOG) injection 0-9 Units (0 Units Subcutaneous  Not Given 08/05/20 1649)  insulin aspart (novoLOG) injection 0-5 Units (0 Units Subcutaneous Not Given 08/04/20 2245)  guaiFENesin-dextromethorphan (ROBITUSSIN DM) 100-10 MG/5ML syrup 5 mL (has no administration in time range)  pantoprazole (PROTONIX) injection 40 mg (40 mg Intravenous Given 08/04/20 2253)  zinc sulfate capsule 220 mg (220 mg Oral Given 08/05/20 1033)  allopurinol (ZYLOPRIM) tablet 100 mg (100 mg Oral Given 08/05/20 1032)  aspirin EC tablet 81 mg (81 mg Oral Given 08/05/20 1032)  amLODipine (NORVASC) tablet 5 mg (5 mg Oral Given 08/05/20 1033)  pregabalin (LYRICA) capsule 100 mg (100 mg Oral Given 08/05/20 1032)  rOPINIRole (REQUIP) tablet 2 mg (2 mg Oral Given 08/05/20 0031)  cholecalciferol (VITAMIN D3) tablet 1,000 Units (1,000  Units Oral Given 08/05/20 1032)  magnesium oxide (MAG-OX) tablet 400 mg (400 mg Oral Given 08/05/20 1032)  multivitamin with minerals tablet 1 tablet (1 tablet Oral Given 08/05/20 1031)  fluticasone (FLONASE) 50 MCG/ACT nasal spray 1 spray (has no administration in time range)  cycloSPORINE (RESTASIS) 0.05 % ophthalmic emulsion 1 drop (1 drop Both Eyes Given 08/05/20 1029)  albuterol (VENTOLIN HFA) 108 (90 Base) MCG/ACT inhaler 2 puff (2 puffs Inhalation Given 08/05/20 1315)  linagliptin (TRADJENTA) tablet 5 mg (5 mg Oral Given 08/05/20 1032)  insulin detemir (LEVEMIR) injection 6 Units (6 Units Subcutaneous Given 08/05/20 1030)  diphenhydrAMINE (BENADRYL) capsule 25 mg (25 mg Oral Given 08/05/20 1655)  carvedilol (COREG) tablet 3.125 mg (3.125 mg Oral Given 08/05/20 1655)  dexamethasone (DECADRON) injection 6 mg (6 mg Intravenous Given 08/03/20 1435)  furosemide (LASIX) injection 40 mg (40 mg Intravenous Given 08/04/20 1827)    Mobility walks with person assist Low fall risk   Focused Assessments Cardiac Assessment Handoff:    No results found for: CKTOTAL, CKMB, CKMBINDEX, TROPONINI Lab Results  Component Value Date   DDIMER 1.90 (H) 08/04/2020   Does the Patient currently  have chest pain? No     R Recommendations: See Admitting Provider Note  Report given to:   Additional Notes:

## 2020-08-06 ENCOUNTER — Other Ambulatory Visit: Payer: Self-pay | Admitting: Physician Assistant

## 2020-08-06 DIAGNOSIS — U071 COVID-19: Secondary | ICD-10-CM | POA: Diagnosis not present

## 2020-08-06 DIAGNOSIS — I493 Ventricular premature depolarization: Secondary | ICD-10-CM

## 2020-08-06 DIAGNOSIS — J1282 Pneumonia due to coronavirus disease 2019: Secondary | ICD-10-CM | POA: Diagnosis not present

## 2020-08-06 LAB — GLUCOSE, CAPILLARY
Glucose-Capillary: 50 mg/dL — ABNORMAL LOW (ref 70–99)
Glucose-Capillary: 101 mg/dL — ABNORMAL HIGH (ref 70–99)
Glucose-Capillary: 139 mg/dL — ABNORMAL HIGH (ref 70–99)

## 2020-08-06 LAB — BASIC METABOLIC PANEL
Anion gap: 8 (ref 5–15)
BUN: 50 mg/dL — ABNORMAL HIGH (ref 8–23)
CO2: 28 mmol/L (ref 22–32)
Calcium: 8.9 mg/dL (ref 8.9–10.3)
Chloride: 102 mmol/L (ref 98–111)
Creatinine, Ser: 1.52 mg/dL — ABNORMAL HIGH (ref 0.44–1.00)
GFR, Estimated: 36 mL/min — ABNORMAL LOW (ref >60)
Glucose, Bld: 65 mg/dL — ABNORMAL LOW (ref 70–99)
Potassium: 3.8 mmol/L (ref 3.5–5.1)
Sodium: 138 mmol/L (ref 135–145)

## 2020-08-06 LAB — CBC
HCT: 37.9 % (ref 36.0–46.0)
Hemoglobin: 12.4 g/dL (ref 12.0–15.0)
MCH: 30.1 pg (ref 26.0–34.0)
MCHC: 32.7 g/dL (ref 30.0–36.0)
MCV: 92 fL (ref 80.0–100.0)
Platelets: 279 10*3/uL (ref 150–400)
RBC: 4.12 MIL/uL (ref 3.87–5.11)
RDW: 14 % (ref 11.5–15.5)
WBC: 5.4 10*3/uL (ref 4.0–10.5)
nRBC: 0 % (ref 0.0–0.2)

## 2020-08-06 LAB — FERRITIN: Ferritin: 80 ng/mL (ref 11–307)

## 2020-08-06 LAB — D-DIMER, QUANTITATIVE: D-Dimer, Quant: 1.91 ug/mL-FEU — ABNORMAL HIGH (ref 0.00–0.50)

## 2020-08-06 LAB — C-REACTIVE PROTEIN: CRP: 1.5 mg/dL — ABNORMAL HIGH (ref <1.0)

## 2020-08-06 LAB — BRAIN NATRIURETIC PEPTIDE: B Natriuretic Peptide: 268.3 pg/mL — ABNORMAL HIGH (ref 0.0–100.0)

## 2020-08-06 MED ORDER — PANTOPRAZOLE SODIUM 40 MG PO TBEC: 40.0000 mg | DELAYED_RELEASE_TABLET | Freq: Every day | ORAL | Status: DC

## 2020-08-06 MED ORDER — HYDROXYZINE HCL 10 MG PO TABS
10.0000 mg | ORAL_TABLET | Freq: Three times a day (TID) | ORAL | Status: DC | PRN
  Administered 2020-08-06 (×2): 10 mg via ORAL
  Filled 2020-08-06 (×2): qty 1

## 2020-08-06 MED ORDER — GUAIFENESIN-DM 100-10 MG/5ML PO SYRP: 5.0000 mL | ORAL_SOLUTION | Freq: Four times a day (QID) | ORAL | 0 refills | Status: DC | PRN

## 2020-08-06 MED ORDER — DIPHENHYDRAMINE HCL 25 MG PO TABS: 25.0000 mg | ORAL_TABLET | Freq: Three times a day (TID) | ORAL | 0 refills | Status: DC | PRN

## 2020-08-06 MED ORDER — DAPAGLIFLOZIN PROPANEDIOL 5 MG PO TABS: 5.0000 mg | ORAL_TABLET | Freq: Every day | ORAL | Status: DC

## 2020-08-06 MED ORDER — CARVEDILOL 3.125 MG PO TABS: 3.1250 mg | ORAL_TABLET | Freq: Two times a day (BID) | ORAL | 2 refills | Status: DC

## 2020-08-06 MED ORDER — ZINC SULFATE 220 (50 ZN) MG PO CAPS: 220.0000 mg | ORAL_CAPSULE | Freq: Every day | ORAL | 0 refills | Status: DC

## 2020-08-06 NOTE — Progress Notes (Signed)
Inpatient Diabetes Program Recommendations  AACE/ADA: New Consensus Statement on Inpatient Glycemic Control (2015)  Target Ranges:  Prepandial:   less than 140 mg/dL      Peak postprandial:   less than 180 mg/dL (1-2 hours)      Critically ill patients:  140 - 180 mg/dL   Lab Results  Component Value Date   GLUCAP 101 (H) 08/06/2020   HGBA1C 6.6 (H) 08/03/2020    Review of Glycemic Control Results for MEIKO, IVES (MRN [id number]) as of 08/06/2020 09:26  Ref. Range 08/05/2020 16:44 08/05/2020 21:08 08/06/2020 07:47 08/06/2020 08:37  Glucose-Capillary Latest Ref Range: 70 - 99 mg/dL 325 (H) 498 (H) 50 (L) 101 (H)   Diabetes history: Type 2 DM Outpatient Diabetes medications: Trulicity 0.75 mg qwk, Metaglip 5-500 mg BID Current orders for Inpatient glycemic control: Glipizide 5 mg BID, Novolog 0-9 units TID, Novolog 0-5 units QHS, Levemir 6 units BID   Inpatient Diabetes Program Recommendations:    Noted hypoglycemic event this am of 50 mg/dL. Patient had refused yesterday's dose of Decadron and order has since been discontinued due to allergy.  At this time, consider discontinuing Levemir 6 units BID.   Thanks, Lujean Rave, MSN, RNC-OB Diabetes Coordinator [phone] (8a-5p)

## 2020-08-06 NOTE — Discharge Summary (Signed)
Physician Discharge Summary  [name] MWU:[id number] DOB: 06/14/1946 DOA: 08/03/2020  PCP: No primary care provider on file.  Admit date: 08/03/2020 Discharge date: 08/06/2020  Time spent: 50 minutes  Recommendations for Outpatient Follow-up:  1. Continue home isolation until 08/24/2020  Discharge Diagnoses:  Principal Problem:   Pneumonia due to COVID-19 virus   Discharge Condition: Stable  Diet recommendation: Heart healthy diet  Filed Weights   08/03/20 1144 08/05/20 1900  Weight: 88 kg 91.3 kg    History of present illness:  75 year old female with past medical history of diabetes mellitus type 2, hypertension, chronic back pain, restless leg syndrome came with worsening shortness of breath.  She is unvaccinated for COVID-19.  In the ED COVID-19 PCR was positive.  Chest x-ray showed interval diffusely increased interstitial prominence with central airway and facial thickening suspicious for interstitial edema.  Patient's echocardiogram showed EF of 20 to 25% with grade 2 diastolic dysfunction and mild elevated pulmonary artery pressure.  She was given Lasix 40 mg IV x1, cardiology was consulted.    Hospital Course:  1. Acute hypoxemic respiratory failure-resolved, patient presented with worsening shortness of breath, COVID-19 PCR positive.  Patient has allergy to cortisone.  She was given Decadron last night which caused erythematous rash on upper extremities, abdomen, lower back.  CRP is down to 1.5.  She is no longer requiring oxygen.  Decadron was discontinued.  Patient has received remdesivir with good results.  At this time will discontinue remdesivir, she has received 4 days of remdesivir in the hospital.  She can be discharged home.  She need to complete 21 days home isolation till 08/24/2020. 2. Acute systolic CHF-echocardiogram obtained shows EF 20-25%.  Cardiology was consulted.  She has trace edema in the lower extremities.  Patient will need cardiac cath versus  coronary CTA as outpatient.  Started on Coreg 3.125 mg p.o. twice daily.  Will need SGLT-2 inhibitor when patient sees cardiology as outpatient.Sherryll Burger and spironolactone will be started as per cardiology discretion as outpatient. 3. Diabetes mellitus type 2-continue home regimen. 4. Hypertension-continue midodrine 5 mg daily, Coreg 3.125 mg p.o. twice daily.  Entresto, spironolactone will be added per cardiology as outpatient.   5. Restless leg syndrome-continue Lyrica 100 g daily, Requip 2 mg at bedtime   Procedures:  Echocardiogram  Consultations:  Cardiology  Discharge Exam: Vitals:   08/06/20 0502 08/06/20 0530  BP: (!) 103/49   Pulse:    Resp: 16   Temp:  97.6 F (36.4 C)  SpO2: 92%     General: Appears in no acute distress Cardiovascular: S1-S2, regular Respiratory: Clear to auscultation bilaterally  No longer requiring oxygen, O2 sats greater than 92% on room air  Discharge Instructions   Discharge Instructions    Diet - low sodium heart healthy   Complete by: As directed    Discharge instructions   Complete by: As directed    Continue home isolation for total 21 days till 08/24/20   Increase activity slowly   Complete by: As directed      Allergies as of 08/06/2020      Reactions   Amlodipine Swelling   Depo-medrol [methylprednisolone] Hives   Gabapentin Rash   hives   Levothyroxine Other (See Comments)   severe muscle pain in neck   Simvastatin Rash   Sitagliptin Other (See Comments)   Urinary hesitancy   Ace Inhibitors    cough   Betadine [povidone Iodine]    rash   Codeine  Nausea And Vomiting   Cortizone-10 [hydrocortisone] Other (See Comments)   unknown   Tetracyclines & Related Nausea And Vomiting   Dexamethasone Rash      Medication List    STOP taking these medications   diltiazem 240 MG 24 hr capsule Commonly known as: CARDIZEM CD   ibuprofen 800 MG tablet Commonly known as: ADVIL   irbesartan-hydrochlorothiazide 300-12.5 MG  tablet Commonly known as: AVALIDE     TAKE these medications   allopurinol 100 MG tablet Commonly known as: ZYLOPRIM Take 100 mg by mouth daily.   amLODipine 5 MG tablet Commonly known as: NORVASC Take 5 mg by mouth daily.   aspirin 81 MG tablet Take 81 mg by mouth daily.   carvedilol 3.125 MG tablet Commonly known as: COREG Take 1 tablet (3.125 mg total) by mouth 2 (two) times daily with a meal.   cholecalciferol 1000 units tablet Commonly known as: VITAMIN D Take 1,000 Units by mouth daily.   cycloSPORINE 0.05 % ophthalmic emulsion Commonly known as: RESTASIS Place 1 drop into both eyes 2 (two) times daily.   diphenhydrAMINE 25 MG tablet Commonly known as: BENADRYL Take 1 tablet (25 mg total) by mouth every 8 (eight) hours as needed for itching.   Dulaglutide 0.75 MG/0.5ML Sopn Inject 0.75 mg into the skin once a week.   fluticasone 50 MCG/ACT nasal spray Commonly known as: FLONASE Place 1 spray into both nostrils daily as needed for allergies.   glipiZIDE-metformin 5-500 MG tablet Commonly known as: METAGLIP Take 1 tablet by mouth 2 (two) times daily before a meal.   guaiFENesin-dextromethorphan 100-10 MG/5ML syrup Commonly known as: ROBITUSSIN DM Take 5 mLs by mouth every 6 (six) hours as needed for cough.   Magnesium 250 MG Tabs Take 1 tablet by mouth daily.   multivitamin with minerals Tabs tablet Take 1 tablet by mouth daily.   Nucynta 50 MG tablet Generic drug: tapentadol Take 50 mg by mouth at bedtime.   omeprazole 40 MG capsule Commonly known as: PRILOSEC Take 40 mg by mouth daily.   ondansetron 4 MG tablet Commonly known as: Zofran Take 1 tablet (4 mg total) by mouth every 8 (eight) hours as needed for nausea or vomiting.   pregabalin 100 MG capsule Commonly known as: LYRICA Take 100 mg by mouth 2 (two) times daily.   rOPINIRole 2 MG tablet Commonly known as: REQUIP Take 2 mg by mouth at bedtime.   zinc sulfate 220 (50 Zn) MG  capsule Take 1 capsule (220 mg total) by mouth daily. Start taking on: August 07, 2020      Allergies  Allergen Reactions  . Amlodipine Swelling  . Depo-Medrol [Methylprednisolone] Hives  . Gabapentin Rash    hives   . Levothyroxine Other (See Comments)    severe muscle pain in neck    . Simvastatin Rash  . Sitagliptin Other (See Comments)    Urinary hesitancy  . Ace Inhibitors     cough  . Betadine [Povidone Iodine]     rash  . Codeine Nausea And Vomiting  . Cortizone-10 [Hydrocortisone] Other (See Comments)    unknown  . Tetracyclines & Related Nausea And Vomiting  . Dexamethasone Rash    Follow-up Information    Meriam Sprague, MD. Go on 08/31/2020.   Specialties: Cardiology, Radiology Why: @10 :20am for hospital follow up. Office will give you a call about monitor set up.  Contact information: [address] [place] [address] [phone]  The results of significant diagnostics from this hospitalization (including imaging, microbiology, ancillary and laboratory) are listed below for reference.    Significant Diagnostic Studies: DG Chest 2 View  Result Date: 08/03/2020 CLINICAL DATA:  Increasing shortness of breath, cough and central chest pain for 1 week. Sinus infection 2 weeks ago. EXAM: CHEST - 2 VIEW COMPARISON:  Radiographs 07/04/2018 and 06/29/2009. FINDINGS: The heart size and mediastinal contours are stable. There is mild aortic atherosclerosis. Interval diffusely increased interstitial prominence with central airway and fissural thickening. No confluent airspace opacity, significant pleural effusion or pneumothorax. There are mild degenerative changes within the thoracic spine. IMPRESSION: Interval diffusely increased interstitial prominence with central airway and fissural thickening, suspicious for interstitial edema. Progressive chronic interstitial lung disease considered less likely. Suggest short-term  radiographic follow-up. Electronically Signed   By: Richardean Sale M.D.   On: 08/03/2020 12:23   ECHOCARDIOGRAM COMPLETE  Result Date: 08/04/2020    ECHOCARDIOGRAM REPORT   Patient Name:   YULANDA RESENDEZ Date of Exam: 08/04/2020 Medical Rec #:  [id number]      Height:       65.0 in Accession #:    [id number]     Weight:       194.0 lb Date of Birth:  Oct 11, 1945      BSA:          1.953 m Patient Age:    43 years       BP:           127/85 mmHg Patient Gender: F              HR:           85 bpm. Exam Location:  Inpatient Procedure: 2D Echo, Cardiac Doppler and Color Doppler Indications:    R94.31 Abnormal EKG  History:        Patient has no prior history of Echocardiogram examinations.                 Mitral Valve Prolapse; Risk Factors:Diabetes, Hypertension and                 GERD. COVID-19 Positive.  Sonographer:    Tiffany Dance Referring Phys: [id number] [place]  1. Left ventricular ejection fraction, by estimation, is 20 to 25%. The left ventricle has severely decreased function. The left ventricle demonstrates global hypokinesis. The left ventricular internal cavity size was mildly to moderately dilated. Left ventricular diastolic parameters are consistent with Grade II diastolic dysfunction (pseudonormalization). Elevated left atrial pressure.  2. Right ventricular systolic function is normal. The right ventricular size is normal. There is mildly elevated pulmonary artery systolic pressure. The estimated right ventricular systolic pressure is [id number] mmHg.  3. Left atrial size was moderately dilated.  4. The mitral valve is degenerative. Mild mitral valve regurgitation. No evidence of mitral stenosis.  5. The aortic valve is tricuspid. There is mild calcification of the aortic valve. There is mild thickening of the aortic valve. Aortic valve regurgitation is not visualized. Mild aortic valve sclerosis is present, with no evidence of aortic valve stenosis.  6. The inferior vena cava is dilated in  size with <50% respiratory variability, suggesting right atrial pressure of 15 mmHg. FINDINGS  Left Ventricle: Left ventricular ejection fraction, by estimation, is 20 to 25%. The left ventricle has severely decreased function. The left ventricle demonstrates global hypokinesis. The left ventricular internal cavity size was mildly to moderately dilated. There is no left ventricular hypertrophy. Left ventricular diastolic  parameters are consistent with Grade II diastolic dysfunction (pseudonormalization). Elevated left atrial pressure. Right Ventricle: The right ventricular size is normal. No increase in right ventricular wall thickness. Right ventricular systolic function is normal. There is mildly elevated pulmonary artery systolic pressure. The tricuspid regurgitant velocity is 2.39  m/s, and with an assumed right atrial pressure of 15 mmHg, the estimated right ventricular systolic pressure is [id number] mmHg. Left Atrium: Left atrial size was moderately dilated. Right Atrium: Right atrial size was normal in size. Pericardium: Trivial pericardial effusion is present. Mitral Valve: The mitral valve is degenerative in appearance. Mild mitral annular calcification. Mild mitral valve regurgitation. No evidence of mitral valve stenosis. Tricuspid Valve: The tricuspid valve is grossly normal. Tricuspid valve regurgitation is mild . No evidence of tricuspid stenosis. Aortic Valve: The aortic valve is tricuspid. There is mild calcification of the aortic valve. There is mild thickening of the aortic valve. Aortic valve regurgitation is not visualized. Mild aortic valve sclerosis is present, with no evidence of aortic valve stenosis. Pulmonic Valve: The pulmonic valve was grossly normal. Pulmonic valve regurgitation is trivial. No evidence of pulmonic stenosis. Aorta: The aortic root and ascending aorta are structurally normal, with no evidence of dilitation. Venous: The inferior vena cava is dilated in size with less than 50%  respiratory variability, suggesting right atrial pressure of 15 mmHg. IAS/Shunts: The atrial septum is grossly normal.  LEFT VENTRICLE PLAX 2D LVIDd:         5.70 cm LVIDs:         4.90 cm LV PW:         1.10 cm LV IVS:        0.80 cm LVOT diam:     1.80 cm LV SV:         43 LV SV Index:   22 LVOT Area:     2.54 cm  RIGHT VENTRICLE             IVC RV Basal diam:  2.90 cm     IVC diam: 2.30 cm RV S prime:     12.30 cm/s TAPSE (M-mode): 1.9 cm LEFT ATRIUM             Index       RIGHT ATRIUM           Index LA diam:        5.10 cm 2.61 cm/m  RA Area:     14.20 cm LA Vol (A2C):   89.8 ml 45.99 ml/m RA Volume:   35.50 ml  18.18 ml/m LA Vol (A4C):   83.8 ml 42.91 ml/m LA Biplane Vol: 88.6 ml 45.37 ml/m  AORTIC VALVE LVOT Vmax:   70.10 cm/s LVOT Vmean:  51.500 cm/s LVOT VTI:    0.168 m  AORTA Ao Root diam: 3.00 cm Ao Asc diam:  3.10 cm MITRAL VALVE                TRICUSPID VALVE MV Area (PHT): 3.65 cm     TR Peak grad:   22.8 mmHg MV Decel Time: 208 msec     TR Vmax:        239.00 cm/s MV E velocity: 118.00 cm/s MV A velocity: 86.60 cm/s   SHUNTS MV E/A ratio:  1.36         Systemic VTI:  0.17 m  Systemic Diam: 1.80 cm Eleonore Chiquito MD Electronically signed by Eleonore Chiquito MD Signature Date/Time: 08/04/2020/4:32:33 PM    Final     Microbiology: Recent Results (from the past 240 hour(s))  Blood Culture (routine x 2)     Status: None (Preliminary result)   Collection Time: 08/03/20  1:57 PM   Specimen: BLOOD  Result Value Ref Range Status   Specimen Description   Final    BLOOD RIGHT ANTECUBITAL Performed at [facility] [address], [place], [place] [address]    Special Requests   Final    BOTTLES DRAWN AEROBIC AND ANAEROBIC Blood Culture results may not be optimal due to an inadequate volume of blood received in culture bottles Performed at [facility] [address] [place], [place] [address]    Culture   Final    NO GROWTH 3  DAYS Performed at [facility], [place] [address] [place], [place] [address]    Report Status PENDING  Incomplete  Blood Culture (routine x 2)     Status: None (Preliminary result)   Collection Time: 08/03/20  1:57 PM   Specimen: BLOOD RIGHT FOREARM  Result Value Ref Range Status   Specimen Description   Final    BLOOD RIGHT FOREARM Performed at [facility] [address], [place], [place] [address]    Special Requests   Final    BOTTLES DRAWN AEROBIC AND ANAEROBIC Blood Culture results may not be optimal due to an inadequate volume of blood received in culture bottles Performed at [facility] [address], [place], [place] [address]    Culture   Final    NO GROWTH 3 DAYS Performed at [facility], [place] [address] [place], [place] [address]    Report Status PENDING  Incomplete     Labs: Basic Metabolic Panel: Recent Labs  Lab 08/03/20 1357 08/04/20 0255 08/06/20 0546  NA 138 136 138  K 4.0 4.5 3.8  CL 101 103 102  CO2 22 23 28   GLUCOSE 204* 230* 65*  BUN 23 32* 50*  CREATININE 1.10* 1.35* 1.52*  CALCIUM 9.9 9.3 8.9   Liver Function Tests: Recent Labs  Lab 08/03/20 1357  AST 26  ALT 28  ALKPHOS 107  BILITOT 1.0  PROT 7.4  ALBUMIN 4.2   No results for input(s): LIPASE, AMYLASE in the last 168 hours. No results for input(s): AMMONIA in the last 168 hours. CBC: Recent Labs  Lab 08/03/20 1357 08/04/20 0255 08/06/20 0546  WBC 11.4* 5.9 5.4  NEUTROABS 10.1*  --   --   HGB 13.1 11.3* 12.4  HCT 40.1 33.8* 37.9  MCV 90.9 89.9 92.0  PLT 292 254 279   Cardiac Enzymes: No results for input(s): CKTOTAL, CKMB, CKMBINDEX, TROPONINI in the last 168 hours. BNP: BNP (last 3 results) Recent Labs    08/03/20 1357 08/04/20 0255 08/06/20 0546  BNP 530.6* 1,567.5* 268.3*    ProBNP (last 3 results) No results for input(s): PROBNP in the last 8760 hours.  CBG: Recent Labs  Lab 08/05/20 1644 08/05/20 2108  08/06/20 0747 08/06/20 0837 08/06/20 1209  GLUCAP 103* 163* 50* 101* 139*       Signed:  Oswald Hillock MD.  [facility] 08/06/2020, 1:08 PM

## 2020-08-06 NOTE — Care Management Important Message (Signed)
Important Message  Patient Details IM Letter given to the Patient. Name: [name] MRN: [id number] Date of Birth: July 02, 1946   Medicare Important Message Given:  Yes     Caren Macadam 08/06/2020, 11:56 AM

## 2020-08-06 NOTE — Progress Notes (Signed)
PHARMACIST - PHYSICIAN COMMUNICATION  DR:   Sharl Ma CONCERNING: IV to Oral Route Change Policy  RECOMMENDATION: This patient is receiving Protonix by the intravenous route.  Based on criteria approved by the Pharmacy and Therapeutics Committee, the intravenous medication(s) is/are being converted to the equivalent oral dose form(s).   DESCRIPTION: These criteria include:  The patient is eating (either orally or via tube) and/or has been taking other orally administered medications for a least 24 hours  The patient has no evidence of active gastrointestinal bleeding or impaired GI absorption (gastrectomy, short bowel, patient on TNA or NPO).  If you have questions about this conversion, please contact the Pharmacy Department   [phone])  [facility]   Lynann Beaver PharmD, BCPS Clinical Pharmacist [facility] main pharmacy [phone] 08/06/2020 12:58 PM

## 2020-08-07 ENCOUNTER — Other Ambulatory Visit: Payer: Self-pay | Admitting: Family Medicine

## 2020-08-07 DIAGNOSIS — Z1231 Encounter for screening mammogram for malignant neoplasm of breast: Secondary | ICD-10-CM

## 2020-08-08 LAB — CULTURE, BLOOD (ROUTINE X 2)
Culture: NO GROWTH
Culture: NO GROWTH

## 2020-08-10 ENCOUNTER — Other Ambulatory Visit: Payer: Self-pay

## 2020-08-10 ENCOUNTER — Inpatient Hospital Stay (HOSPITAL_COMMUNITY)
Admission: EM | Admit: 2020-08-10 | Discharge: 2020-08-21 | DRG: 871 | Disposition: A | Discharge status: Home Health | Payer: Medicare Other | Attending: Internal Medicine | Admitting: Internal Medicine

## 2020-08-10 ENCOUNTER — Emergency Department (HOSPITAL_COMMUNITY): Payer: Medicare Other

## 2020-08-10 ENCOUNTER — Encounter (HOSPITAL_COMMUNITY): Payer: Self-pay | Admitting: Emergency Medicine

## 2020-08-10 DIAGNOSIS — N179 Acute kidney failure, unspecified: Secondary | ICD-10-CM | POA: Diagnosis present

## 2020-08-10 DIAGNOSIS — K644 Residual hemorrhoidal skin tags: Secondary | ICD-10-CM | POA: Diagnosis present

## 2020-08-10 DIAGNOSIS — U071 COVID-19: Secondary | ICD-10-CM | POA: Diagnosis not present

## 2020-08-10 DIAGNOSIS — A419 Sepsis, unspecified organism: Principal | ICD-10-CM | POA: Diagnosis present

## 2020-08-10 DIAGNOSIS — Z7984 Long term (current) use of oral hypoglycemic drugs: Secondary | ICD-10-CM

## 2020-08-10 DIAGNOSIS — E876 Hypokalemia: Secondary | ICD-10-CM | POA: Diagnosis present

## 2020-08-10 DIAGNOSIS — I959 Hypotension, unspecified: Secondary | ICD-10-CM | POA: Diagnosis not present

## 2020-08-10 DIAGNOSIS — K648 Other hemorrhoids: Secondary | ICD-10-CM | POA: Diagnosis present

## 2020-08-10 DIAGNOSIS — Z978 Presence of other specified devices: Secondary | ICD-10-CM

## 2020-08-10 DIAGNOSIS — I472 Ventricular tachycardia: Secondary | ICD-10-CM | POA: Diagnosis not present

## 2020-08-10 DIAGNOSIS — Z96691 Finger-joint replacement of right hand: Secondary | ICD-10-CM | POA: Diagnosis present

## 2020-08-10 DIAGNOSIS — J1282 Pneumonia due to coronavirus disease 2019: Secondary | ICD-10-CM

## 2020-08-10 DIAGNOSIS — M109 Gout, unspecified: Secondary | ICD-10-CM | POA: Diagnosis present

## 2020-08-10 DIAGNOSIS — J9601 Acute respiratory failure with hypoxia: Secondary | ICD-10-CM

## 2020-08-10 DIAGNOSIS — D631 Anemia in chronic kidney disease: Secondary | ICD-10-CM | POA: Diagnosis present

## 2020-08-10 DIAGNOSIS — Z4659 Encounter for fitting and adjustment of other gastrointestinal appliance and device: Secondary | ICD-10-CM

## 2020-08-10 DIAGNOSIS — L8990 Pressure ulcer of unspecified site, unspecified stage: Secondary | ICD-10-CM | POA: Insufficient documentation

## 2020-08-10 DIAGNOSIS — J159 Unspecified bacterial pneumonia: Secondary | ICD-10-CM | POA: Diagnosis present

## 2020-08-10 DIAGNOSIS — R6521 Severe sepsis with septic shock: Secondary | ICD-10-CM | POA: Diagnosis present

## 2020-08-10 DIAGNOSIS — Y95 Nosocomial condition: Secondary | ICD-10-CM | POA: Diagnosis present

## 2020-08-10 DIAGNOSIS — N1831 Chronic kidney disease, stage 3a: Secondary | ICD-10-CM | POA: Diagnosis present

## 2020-08-10 DIAGNOSIS — J9690 Respiratory failure, unspecified, unspecified whether with hypoxia or hypercapnia: Secondary | ICD-10-CM

## 2020-08-10 DIAGNOSIS — Z885 Allergy status to narcotic agent status: Secondary | ICD-10-CM

## 2020-08-10 DIAGNOSIS — I471 Supraventricular tachycardia: Secondary | ICD-10-CM | POA: Diagnosis not present

## 2020-08-10 DIAGNOSIS — J189 Pneumonia, unspecified organism: Secondary | ICD-10-CM | POA: Diagnosis not present

## 2020-08-10 DIAGNOSIS — E872 Acidosis: Secondary | ICD-10-CM | POA: Diagnosis present

## 2020-08-10 DIAGNOSIS — J9602 Acute respiratory failure with hypercapnia: Secondary | ICD-10-CM | POA: Diagnosis present

## 2020-08-10 DIAGNOSIS — E785 Hyperlipidemia, unspecified: Secondary | ICD-10-CM | POA: Diagnosis present

## 2020-08-10 DIAGNOSIS — R609 Edema, unspecified: Secondary | ICD-10-CM | POA: Diagnosis not present

## 2020-08-10 DIAGNOSIS — M1990 Unspecified osteoarthritis, unspecified site: Secondary | ICD-10-CM | POA: Diagnosis present

## 2020-08-10 DIAGNOSIS — I340 Nonrheumatic mitral (valve) insufficiency: Secondary | ICD-10-CM | POA: Diagnosis present

## 2020-08-10 DIAGNOSIS — E1122 Type 2 diabetes mellitus with diabetic chronic kidney disease: Secondary | ICD-10-CM | POA: Diagnosis present

## 2020-08-10 DIAGNOSIS — Z6833 Body mass index (BMI) 33.0-33.9, adult: Secondary | ICD-10-CM

## 2020-08-10 DIAGNOSIS — R451 Restlessness and agitation: Secondary | ICD-10-CM | POA: Diagnosis not present

## 2020-08-10 DIAGNOSIS — E669 Obesity, unspecified: Secondary | ICD-10-CM | POA: Diagnosis present

## 2020-08-10 DIAGNOSIS — E781 Pure hyperglyceridemia: Secondary | ICD-10-CM | POA: Diagnosis present

## 2020-08-10 DIAGNOSIS — Z8616 Personal history of COVID-19: Secondary | ICD-10-CM

## 2020-08-10 DIAGNOSIS — Z7982 Long term (current) use of aspirin: Secondary | ICD-10-CM

## 2020-08-10 DIAGNOSIS — Z888 Allergy status to other drugs, medicaments and biological substances status: Secondary | ICD-10-CM

## 2020-08-10 DIAGNOSIS — K219 Gastro-esophageal reflux disease without esophagitis: Secondary | ICD-10-CM | POA: Diagnosis present

## 2020-08-10 DIAGNOSIS — G2581 Restless legs syndrome: Secondary | ICD-10-CM | POA: Diagnosis present

## 2020-08-10 DIAGNOSIS — R0603 Acute respiratory distress: Secondary | ICD-10-CM | POA: Diagnosis not present

## 2020-08-10 DIAGNOSIS — I509 Heart failure, unspecified: Secondary | ICD-10-CM

## 2020-08-10 DIAGNOSIS — G8929 Other chronic pain: Secondary | ICD-10-CM | POA: Diagnosis present

## 2020-08-10 DIAGNOSIS — I130 Hypertensive heart and chronic kidney disease with heart failure and stage 1 through stage 4 chronic kidney disease, or unspecified chronic kidney disease: Secondary | ICD-10-CM | POA: Diagnosis present

## 2020-08-10 DIAGNOSIS — I5023 Acute on chronic systolic (congestive) heart failure: Secondary | ICD-10-CM | POA: Diagnosis not present

## 2020-08-10 DIAGNOSIS — Z881 Allergy status to other antibiotic agents status: Secondary | ICD-10-CM

## 2020-08-10 DIAGNOSIS — J302 Other seasonal allergic rhinitis: Secondary | ICD-10-CM | POA: Diagnosis present

## 2020-08-10 DIAGNOSIS — Z0184 Encounter for antibody response examination: Secondary | ICD-10-CM

## 2020-08-10 DIAGNOSIS — G9341 Metabolic encephalopathy: Secondary | ICD-10-CM | POA: Diagnosis present

## 2020-08-10 DIAGNOSIS — Z79899 Other long term (current) drug therapy: Secondary | ICD-10-CM

## 2020-08-10 LAB — BRAIN NATRIURETIC PEPTIDE: B Natriuretic Peptide: 1264 pg/mL — ABNORMAL HIGH (ref 0.0–100.0)

## 2020-08-10 LAB — CBC WITH DIFFERENTIAL/PLATELET
Abs Immature Granulocytes: 0.17 10*3/uL — ABNORMAL HIGH (ref 0.00–0.07)
Basophils Absolute: 0.1 10*3/uL (ref 0.0–0.1)
Basophils Relative: 1 %
Eosinophils Absolute: 1.4 10*3/uL — ABNORMAL HIGH (ref 0.0–0.5)
Eosinophils Relative: 9 %
HCT: 46.2 % — ABNORMAL HIGH (ref 36.0–46.0)
Hemoglobin: 14.2 g/dL (ref 12.0–15.0)
Immature Granulocytes: 1 %
Lymphocytes Relative: 26 %
Lymphs Abs: 4.4 10*3/uL — ABNORMAL HIGH (ref 0.7–4.0)
MCH: 29.4 pg (ref 26.0–34.0)
MCHC: 30.7 g/dL (ref 30.0–36.0)
MCV: 95.7 fL (ref 80.0–100.0)
Monocytes Absolute: 0.8 10*3/uL (ref 0.1–1.0)
Monocytes Relative: 5 %
Neutro Abs: 9.7 10*3/uL — ABNORMAL HIGH (ref 1.7–7.7)
Neutrophils Relative %: 58 %
Platelets: 386 10*3/uL (ref 150–400)
RBC: 4.83 MIL/uL (ref 3.87–5.11)
RDW: 14.1 % (ref 11.5–15.5)
WBC: 16.6 10*3/uL — ABNORMAL HIGH (ref 4.0–10.5)
nRBC: 0 % (ref 0.0–0.2)

## 2020-08-10 LAB — BLOOD GAS, ARTERIAL
Acid-base deficit: 6.2 mmol/L — ABNORMAL HIGH (ref 0.0–2.0)
Bicarbonate: 17.2 mmol/L — ABNORMAL LOW (ref 20.0–28.0)
FIO2: 100
O2 Saturation: 96.6 %
Patient temperature: 38.1
pCO2 arterial: 79.6 mmHg (ref 32.0–48.0)
pH, Arterial: 7.084 — CL (ref 7.350–7.450)
pO2, Arterial: 148 mmHg — ABNORMAL HIGH (ref 83.0–108.0)

## 2020-08-10 LAB — BASIC METABOLIC PANEL
Anion gap: 13 (ref 5–15)
BUN: 24 mg/dL — ABNORMAL HIGH (ref 8–23)
CO2: 22 mmol/L (ref 22–32)
Calcium: 8.6 mg/dL — ABNORMAL LOW (ref 8.9–10.3)
Chloride: 99 mmol/L (ref 98–111)
Creatinine, Ser: 1.58 mg/dL — ABNORMAL HIGH (ref 0.44–1.00)
GFR, Estimated: 34 mL/min — ABNORMAL LOW (ref >60)
Glucose, Bld: 365 mg/dL — ABNORMAL HIGH (ref 70–99)
Potassium: 3.7 mmol/L (ref 3.5–5.1)
Sodium: 134 mmol/L — ABNORMAL LOW (ref 135–145)

## 2020-08-10 LAB — TROPONIN I (HIGH SENSITIVITY): Troponin I (High Sensitivity): 41 ng/L — ABNORMAL HIGH (ref <18)

## 2020-08-10 MED ORDER — FUROSEMIDE 10 MG/ML IJ SOLN
80.0000 mg | Freq: Once | INTRAMUSCULAR | Status: AC
  Administered 2020-08-10: 80 mg via INTRAVENOUS
  Filled 2020-08-10: qty 8

## 2020-08-10 MED ORDER — PROPOFOL 1000 MG/100ML IV EMUL
INTRAVENOUS | Status: AC
  Administered 2020-08-10: 10 ug/kg/min via INTRAVENOUS
  Filled 2020-08-10: qty 100

## 2020-08-10 MED ORDER — ACETAMINOPHEN 650 MG RE SUPP
650.0000 mg | Freq: Once | RECTAL | Status: AC
  Administered 2020-08-10: 650 mg via RECTAL
  Filled 2020-08-10: qty 1

## 2020-08-10 MED ORDER — LORAZEPAM 2 MG/ML IJ SOLN
INTRAMUSCULAR | Status: AC
  Administered 2020-08-10: 1 mg via INTRAVENOUS
  Filled 2020-08-10: qty 1

## 2020-08-10 MED ORDER — LORAZEPAM 2 MG/ML IJ SOLN: 1.0000 mg | Freq: Once | INTRAMUSCULAR | Status: AC

## 2020-08-10 MED ORDER — PROPOFOL 1000 MG/100ML IV EMUL
5.0000 ug/kg/min | INTRAVENOUS | Status: DC
  Administered 2020-08-11: 45 ug/kg/min via INTRAVENOUS
  Administered 2020-08-11: 40 ug/kg/min via INTRAVENOUS
  Administered 2020-08-11: 45 ug/kg/min via INTRAVENOUS
  Filled 2020-08-10 (×4): qty 100

## 2020-08-10 NOTE — ED Provider Notes (Signed)
[facility] EMERGENCY DEPARTMENT Provider Note   CSN: [id number] Arrival date & time: 08/10/20  2205     History Chief Complaint  Patient presents with  . Respiratory Distress    [name] is a 75 y.o. female.  Pt presents to the ED today with sob.  Pt was admitted from 1/3 to 1/6 for Covid pneumonia.   Unfortunately, pt was not vaccinated.  Pt also had a CHF exacerbation while there and had an ECHO which showed an EF of 20-25%.  She was seen by cardiology in the hospital.  Plan for cardiac cath vs coronary artery CT as an outpatient.  Pt developed a rash to decadron.  Pt received 4 days of remdesivir in the hospital.  She was d/c on room air.    Tonight, pt developed worsening sob.  The pt is unable to give me any hx due to extreme sob.  She had O2 sats of 72% on RA.  She was placed on a 100% NRB and sats only went up to 88%.        Past Medical History:  Diagnosis Date  . Arthritis   . Chronic back pain    buldging disc,scoliosis,arthritis  . Diabetes mellitus without complication ([place])    takes Trulicity,Jardiance,and Metformin daily.Average fasting blood sugar runs around130  . GERD (gastroesophageal reflux disease)    takes Omeprazole daily  . History of bronchitis > 8 yrs ago  . History of shingles   . HTN (hypertension)    takes Amlodipine and Micardis daily  . Hx of colonic polyps    benign  . Internal and external hemorrhoids without complication   . Joint pain   . Mitral valve prolapse   . Nocturia   . PONV (postoperative nausea and vomiting)    when gets injections in joints gets hives.Betadine rash  . Restless leg syndrome    takes Requip at bedtime  . Seasonal allergies    takes Claritin daily as needed  . Urinary frequency   . Uterine fibroid     Patient Active Problem List   Diagnosis Date Noted  . CHF exacerbation ([place]) 08/10/2020  . Pneumonia due to COVID-19 virus 08/03/2020  . GERD (gastroesophageal reflux disease) 05/25/2011  .  Hemorrhoids, internal, with bleeding 05/25/2011  . Obesity 05/25/2011    Past Surgical History:  Procedure Laterality Date  . BUNIONECTOMY Bilateral   . COLONOSCOPY  07/12/2004   diverticulosis, internal and external hemorrhoids  . COLONOSCOPY    . FINGER ARTHROSCOPY WITH CARPOMETACARPEL [facility]) ARTHROPLASTY Right 09/28/2015   Procedure: RIGHT THUMB TRAPEZIUM EXCISION WITH CARPOMETACARPEL ([place]) ARTHROPLASTY AND TENDON TRANSFER;  Surgeon: Iran Planas, MD;  Location: [facility];  Service: Orthopedics;  Laterality: Right;  . HEMORRHOID SURGERY     almost 40 yrs ago  . HYSTEROSCOPY WITH D & C  08/23/2000   and resectoscopic myomectomy  . LUMBAR DISC SURGERY  03/16/2005  . LUMBAR EPIDURAL INJECTION    . PLANTAR FASCIA RELEASE Left 02/03/2010   and torn tendon  . TENDON TRANSFER Right 09/28/2015   Procedure: TENDON TRANSFER;  Surgeon: Iran Planas, MD;  Location: [facility];  Service: Orthopedics;  Laterality: Right;  . TONSILLECTOMY    . TUBAL LIGATION    . WRIST SURGERY     left, removal of cyst     OB History   No obstetric history on file.     Family History  Problem Relation Age of Onset  . Pancreatic cancer Father   .  Colon cancer Neg Hx   . Breast cancer Neg Hx     Social History   Tobacco Use  . Smoking status: Never Smoker  . Smokeless tobacco: Never Used  Substance Use Topics  . Alcohol use: No    Alcohol/week: 0.0 standard drinks  . Drug use: No    Home Medications Prior to Admission medications   Medication Sig Start Date End Date Taking? Authorizing Provider  allopurinol (ZYLOPRIM) 100 MG tablet Take 100 mg by mouth daily. 07/06/20   [provider]  amLODipine (NORVASC) 5 MG tablet Take 5 mg by mouth daily.    [provider]  aspirin 81 MG tablet Take 81 mg by mouth daily.    [provider]  carvedilol (COREG) 3.125 MG tablet Take 1 tablet (3.125 mg total) by mouth 2 (two) times daily with a meal. 08/06/20   Oswald Hillock, MD  cholecalciferol  (VITAMIN D) 1000 UNITS tablet Take 1,000 Units by mouth daily.    [provider]  cycloSPORINE (RESTASIS) 0.05 % ophthalmic emulsion Place 1 drop into both eyes 2 (two) times daily.    [provider]  diphenhydrAMINE (BENADRYL) 25 MG tablet Take 1 tablet (25 mg total) by mouth every 8 (eight) hours as needed for itching. 08/06/20   Oswald Hillock, MD  Dulaglutide 0.75 MG/0.5ML SOPN Inject 0.75 mg into the skin once a week.    [provider]  fluticasone (FLONASE) 50 MCG/ACT nasal spray Place 1 spray into both nostrils daily as needed for allergies. 01/11/20   [provider]  glipiZIDE-metformin (METAGLIP) 5-500 MG tablet Take 1 tablet by mouth 2 (two) times daily before a meal.    [provider]  guaiFENesin-dextromethorphan (ROBITUSSIN DM) 100-10 MG/5ML syrup Take 5 mLs by mouth every 6 (six) hours as needed for cough. 08/06/20   Oswald Hillock, MD  Magnesium 250 MG TABS Take 1 tablet by mouth daily.    [provider]  Multiple Vitamin (MULTIVITAMIN WITH MINERALS) TABS tablet Take 1 tablet by mouth daily.    [provider]  NUCYNTA 50 MG tablet Take 50 mg by mouth at bedtime. 06/09/20   [provider]  omeprazole (PRILOSEC) 40 MG capsule Take 40 mg by mouth daily.    [provider]  ondansetron (ZOFRAN) 4 MG tablet Take 1 tablet (4 mg total) by mouth every 8 (eight) hours as needed for nausea or vomiting. Patient not taking: Reported on 08/06/2020 09/28/15   Iran Planas, MD  pregabalin (LYRICA) 100 MG capsule Take 100 mg by mouth 2 (two) times daily. 07/21/20   [provider]  rOPINIRole (REQUIP) 2 MG tablet Take 2 mg by mouth at bedtime.  06/26/15   [provider]  zinc sulfate 220 (50 Zn) MG capsule Take 1 capsule (220 mg total) by mouth daily. 08/07/20   Oswald Hillock, MD  famotidine (PEPCID) 20 MG tablet Take 1 tablet (20 mg total) by mouth 2 (two) times daily. Patient not taking: No sig reported  07/15/16 08/03/20  Larene Pickett, PA-C    Allergies    Amlodipine, Depo-medrol [methylprednisolone], Gabapentin, Levothyroxine, Simvastatin, Sitagliptin, Ace inhibitors, Betadine [povidone iodine], Codeine, Cortizone-10 [hydrocortisone], Tetracyclines & related, and Dexamethasone  Review of Systems   Review of Systems  Unable to perform ROS: Severe respiratory distress    Physical Exam Updated Vital Signs BP 136/75   Pulse (!) 102   Temp (!) 100.6 F (38.1 C) (Rectal)   Resp Marland Kitchen)  23   Ht 5\' 5"  (1.651 m)   Wt 92 kg   SpO2 93%   BMI 33.75 kg/m   Physical Exam Vitals and nursing note reviewed.  Constitutional:      General: She is in acute distress.     Appearance: She is ill-appearing, toxic-appearing and diaphoretic.  HENT:     Head: Normocephalic and atraumatic.     Right Ear: External ear normal.     Left Ear: External ear normal.     Nose: Nose normal.     Mouth/Throat:     Mouth: Mucous membranes are moist.     Pharynx: Oropharynx is clear.  Eyes:     Extraocular Movements: Extraocular movements intact.     Conjunctiva/sclera: Conjunctivae normal.     Pupils: Pupils are equal, round, and reactive to light.  Cardiovascular:     Rate and Rhythm: Regular rhythm. Tachycardia present.  Pulmonary:     Effort: Respiratory distress present.     Breath sounds: Rhonchi present.  Abdominal:     General: Abdomen is flat. Bowel sounds are normal.     Palpations: Abdomen is soft.  Musculoskeletal:        General: Normal range of motion.     Cervical back: Normal range of motion and neck supple.  Skin:    General: Skin is warm.     Capillary Refill: Capillary refill takes less than 2 seconds.  Neurological:     Mental Status: She is alert. She is disoriented.  Psychiatric:     Comments: Unable to assess     ED Results / Procedures / Treatments   Labs (all labs ordered are listed, but only abnormal results are displayed) Labs Reviewed  BASIC METABOLIC PANEL -  Abnormal; Notable for the following components:      Result Value   Sodium 134 (*)    Glucose, Bld 365 (*)    BUN 24 (*)    Creatinine, Ser 1.58 (*)    Calcium 8.6 (*)    GFR, Estimated 34 (*)    All other components within normal limits  BRAIN NATRIURETIC PEPTIDE - Abnormal; Notable for the following components:   B Natriuretic Peptide 1,264.0 (*)    All other components within normal limits  CBC WITH DIFFERENTIAL/PLATELET - Abnormal; Notable for the following components:   WBC 16.6 (*)    HCT 46.2 (*)    Neutro Abs 9.7 (*)    Lymphs Abs 4.4 (*)    Eosinophils Absolute 1.4 (*)    Abs Immature Granulocytes 0.17 (*)    All other components within normal limits  BLOOD GAS, ARTERIAL - Abnormal; Notable for the following components:   pH, Arterial 7.084 (*)    pCO2 arterial 79.6 (*)    pO2, Arterial 148 (*)    Bicarbonate 17.2 (*)    Acid-base deficit 6.2 (*)    All other components within normal limits  TROPONIN I (HIGH SENSITIVITY) - Abnormal; Notable for the following components:   Troponin I (High Sensitivity) 41 (*)    All other components within normal limits  URINALYSIS, ROUTINE W REFLEX MICROSCOPIC  TRIGLYCERIDES  TROPONIN I (HIGH SENSITIVITY)    EKG None  Radiology DG Chest Portable 1 View  Result Date: 08/10/2020 CLINICAL DATA:  COVID positive.  Post intubation. EXAM: PORTABLE CHEST 1 VIEW COMPARISON:  Radiograph earlier today. FINDINGS: Endotracheal tube tip 2.5 cm from the carina. Tip and side port of the enteric tube below the diaphragm in the stomach.  Heterogeneous bilateral lung opacities with improvement from earlier today. Stable heart size and mediastinal contours. Possible small pleural effusions. No pneumothorax. IMPRESSION: 1. Endotracheal tube tip 2.5 cm from the carina. Enteric tube in place. 2. Heterogeneous bilateral lung opacities with improvement from earlier today, likely improving pulmonary edema. Underlying pneumonia also considered in the setting of  COVID. Possible small pleural effusions. Electronically Signed   By: Keith Rake M.D.   On: 08/10/2020 23:43   DG Chest Port 1 View  Result Date: 08/10/2020 CLINICAL DATA:  75 year old female with shortness of breath. EXAM: PORTABLE CHEST 1 VIEW COMPARISON:  Chest radiograph dated 08/03/2020. FINDINGS: Diffuse interstitial prominence and airspace opacities throughout the lungs may represent edema, pneumonia, or combination. Clinical correlation is recommended. Small bilateral pleural effusions noted. No pneumothorax. Top-normal cardiac size. Atherosclerotic calcification of the aorta. No acute osseous pathology. IMPRESSION: Diffuse interstitial prominence and airspace opacities may represent edema, pneumonia, or combination. Small bilateral pleural effusions. Electronically Signed   By: Anner Crete M.D.   On: 08/10/2020 22:46    Procedures Procedure Name: Intubation Date/Time: 08/10/2020 11:55 PM Performed by: Isla Pence, MD Pre-anesthesia Checklist: Patient identified, Patient being monitored, Emergency Drugs available, Timeout performed and Suction available Oxygen Delivery Method: Non-rebreather mask Preoxygenation: Pre-oxygenation with 100% oxygen Induction Type: Rapid sequence Ventilation: Mask ventilation without difficulty Laryngoscope Size: Glidescope and 3 Tube size: 7.5 mm Number of attempts: 1 Placement Confirmation: ETT inserted through vocal cords under direct vision,  CO2 detector and Breath sounds checked- equal and bilateral Dental Injury: Teeth and Oropharynx as per pre-operative assessment  Future Recommendations: Recommend- induction with short-acting agent, and alternative techniques readily available      (including critical care time)  Medications Ordered in ED Medications  propofol (DIPRIVAN) 1000 MG/100ML infusion (10 mcg/kg/min  92 kg Intravenous New Bag/Given 08/10/20 2309)  LORazepam (ATIVAN) injection 1 mg (1 mg Intravenous Given 08/10/20 2224)   furosemide (LASIX) injection 80 mg (80 mg Intravenous Given 08/10/20 2333)  acetaminophen (TYLENOL) suppository 650 mg (650 mg Rectal Given 08/10/20 2335)    ED Course  I have reviewed the triage vital signs and the nursing notes.  Pertinent labs & imaging results that were available during my care of the patient were reviewed by me and considered in my medical decision making (see chart for details).    MDM Rules/Calculators/A&P                          We initially tried bipap, but pt's MS declined and she was no longer protecting her airway.  Pt's ABG also showed severe acidosis.  Decision made to intubate. During intubation, pt had frank pulmonary edema with secretions coming up through airway.  Pt febrile, so she is given tylenol.  Lasix given as well.  Pt d/w husband and daughter.  They were updated on her care.  Pt d/w Dr. Tacy Learn (ICU) who accepted pt for transfer to [facility]  He asked that the vent rate increase to 25.  I spoke with RT who will do this.  [name] was evaluated in Emergency Department on 08/10/2020 for the symptoms described in the history of present illness. She was evaluated in the context of the global COVID-19 pandemic, which necessitated consideration that the patient might be at risk for infection with the SARS-CoV-2 virus that causes COVID-19. Institutional protocols and algorithms that pertain to the evaluation of patients at risk for COVID-19 are in a state of rapid change  based on information released by regulatory bodies including the CDC and federal and state organizations. These policies and algorithms were followed during the patient's care in the ED.  CRITICAL CARE Performed by: Isla Pence   Total critical care time: 60 minutes  Critical care time was exclusive of separately billable procedures and treating other patients.  Critical care was necessary to treat or prevent imminent or life-threatening deterioration.  Critical care was time  spent personally by me on the following activities: development of treatment plan with patient and/or surrogate as well as nursing, discussions with consultants, evaluation of patient's response to treatment, examination of patient, obtaining history from patient or surrogate, ordering and performing treatments and interventions, ordering and review of laboratory studies, ordering and review of radiographic studies, pulse oximetry and re-evaluation of patient's condition.  Final Clinical Impression(s) / ED Diagnoses Final diagnoses:  Acute on chronic congestive heart failure, unspecified heart failure type ([place])  Acute respiratory failure with hypoxia and hypercapnia ([facility])  Pneumonia due to COVID-19 virus    Rx / DC Orders ED Discharge Orders    None       Isla Pence, MD 08/10/20 2358

## 2020-08-10 NOTE — ED Triage Notes (Signed)
Pt brought in via Emergency Traffic from home with Respiratory Distress. Pt d/c from [place] last week and is Covid +. Per EMS, pt's O2 sats on room air were 72%. Pt was placed on 100% NRB en route but sats only up to 88%.

## 2020-08-11 ENCOUNTER — Inpatient Hospital Stay (HOSPITAL_COMMUNITY): Payer: Medicare Other

## 2020-08-11 DIAGNOSIS — I509 Heart failure, unspecified: Secondary | ICD-10-CM | POA: Diagnosis not present

## 2020-08-11 DIAGNOSIS — J9601 Acute respiratory failure with hypoxia: Secondary | ICD-10-CM | POA: Diagnosis not present

## 2020-08-11 DIAGNOSIS — U071 COVID-19: Secondary | ICD-10-CM

## 2020-08-11 DIAGNOSIS — J1282 Pneumonia due to coronavirus disease 2019: Secondary | ICD-10-CM

## 2020-08-11 DIAGNOSIS — J9602 Acute respiratory failure with hypercapnia: Secondary | ICD-10-CM | POA: Diagnosis not present

## 2020-08-11 LAB — BLOOD GAS, ARTERIAL
Acid-Base Excess: 0.9 mmol/L (ref 0.0–2.0)
Acid-base deficit: 1.3 mmol/L (ref 0.0–2.0)
Bicarbonate: 23.4 mmol/L (ref 20.0–28.0)
Bicarbonate: 24.6 mmol/L (ref 20.0–28.0)
Drawn by: 21310
Drawn by: 25770
FIO2: 75
FIO2: 60
MECHVT: 550 mL
MECHVT: 450 mL
O2 Saturation: 98.8 %
O2 Saturation: 97.9 %
PEEP: 5 cmH2O
PEEP: 5 cmH2O
Patient temperature: 37.6
Patient temperature: 98.6
RATE: 25 resp/min
RATE: 25 resp/min
pCO2 arterial: 39.8 mmHg (ref 32.0–48.0)
pCO2 arterial: 38.2 mmHg (ref 32.0–48.0)
pH, Arterial: 7.382 (ref 7.350–7.450)
pH, Arterial: 7.425 (ref 7.350–7.450)
pO2, Arterial: 133 mmHg — ABNORMAL HIGH (ref 83.0–108.0)
pO2, Arterial: 114 mmHg — ABNORMAL HIGH (ref 83.0–108.0)

## 2020-08-11 LAB — CBC
HCT: 39.7 % (ref 36.0–46.0)
Hemoglobin: 12.7 g/dL (ref 12.0–15.0)
MCH: 29.2 pg (ref 26.0–34.0)
MCHC: 32 g/dL (ref 30.0–36.0)
MCV: 91.3 fL (ref 80.0–100.0)
Platelets: 264 10*3/uL (ref 150–400)
RBC: 4.35 MIL/uL (ref 3.87–5.11)
RDW: 14.6 % (ref 11.5–15.5)
WBC: 16.7 10*3/uL — ABNORMAL HIGH (ref 4.0–10.5)
nRBC: 0 % (ref 0.0–0.2)

## 2020-08-11 LAB — COMPREHENSIVE METABOLIC PANEL
ALT: 62 U/L — ABNORMAL HIGH (ref 0–44)
AST: 57 U/L — ABNORMAL HIGH (ref 15–41)
Albumin: 2.9 g/dL — ABNORMAL LOW (ref 3.5–5.0)
Alkaline Phosphatase: 128 U/L — ABNORMAL HIGH (ref 38–126)
Anion gap: 14 (ref 5–15)
BUN: 31 mg/dL — ABNORMAL HIGH (ref 8–23)
CO2: 21 mmol/L — ABNORMAL LOW (ref 22–32)
Calcium: 8.3 mg/dL — ABNORMAL LOW (ref 8.9–10.3)
Chloride: 103 mmol/L (ref 98–111)
Creatinine, Ser: 1.64 mg/dL — ABNORMAL HIGH (ref 0.44–1.00)
GFR, Estimated: 33 mL/min — ABNORMAL LOW (ref >60)
Glucose, Bld: 246 mg/dL — ABNORMAL HIGH (ref 70–99)
Potassium: 3.7 mmol/L (ref 3.5–5.1)
Sodium: 138 mmol/L (ref 135–145)
Total Bilirubin: 0.7 mg/dL (ref 0.3–1.2)
Total Protein: 5.7 g/dL — ABNORMAL LOW (ref 6.5–8.1)

## 2020-08-11 LAB — URINALYSIS, ROUTINE W REFLEX MICROSCOPIC
Bacteria, UA: NONE SEEN
Bilirubin Urine: NEGATIVE
Glucose, UA: NEGATIVE mg/dL
Hgb urine dipstick: NEGATIVE
Ketones, ur: NEGATIVE mg/dL
Leukocytes,Ua: NEGATIVE
Nitrite: NEGATIVE
Protein, ur: 30 mg/dL — AB
Specific Gravity, Urine: 1.019 (ref 1.005–1.030)
pH: 5 (ref 5.0–8.0)

## 2020-08-11 LAB — CK TOTAL AND CKMB (NOT AT ARMC)
CK, MB: 12.8 ng/mL — ABNORMAL HIGH (ref 0.5–5.0)
Relative Index: 0.8 (ref 0.0–2.5)
Total CK: 1519 U/L — ABNORMAL HIGH (ref 38–234)

## 2020-08-11 LAB — CBG MONITORING, ED: Glucose-Capillary: 235 mg/dL — ABNORMAL HIGH (ref 70–99)

## 2020-08-11 LAB — TROPONIN I (HIGH SENSITIVITY)
Troponin I (High Sensitivity): 77 ng/L — ABNORMAL HIGH (ref <18)
Troponin I (High Sensitivity): 45 ng/L — ABNORMAL HIGH (ref <18)

## 2020-08-11 LAB — HEMOGLOBIN A1C
Hgb A1c MFr Bld: 6.9 % — ABNORMAL HIGH (ref 4.8–5.6)
Mean Plasma Glucose: 151.33 mg/dL

## 2020-08-11 LAB — GLUCOSE, CAPILLARY
Glucose-Capillary: 185 mg/dL — ABNORMAL HIGH (ref 70–99)
Glucose-Capillary: 188 mg/dL — ABNORMAL HIGH (ref 70–99)

## 2020-08-11 LAB — MAGNESIUM: Magnesium: 1.6 mg/dL — ABNORMAL LOW (ref 1.7–2.4)

## 2020-08-11 LAB — PHOSPHORUS: Phosphorus: 3.8 mg/dL (ref 2.5–4.6)

## 2020-08-11 LAB — TRIGLYCERIDES: Triglycerides: 260 mg/dL — ABNORMAL HIGH (ref <150)

## 2020-08-11 LAB — LACTIC ACID, PLASMA: Lactic Acid, Venous: 1.8 mmol/L (ref 0.5–1.9)

## 2020-08-11 LAB — MRSA PCR SCREENING: MRSA by PCR: NEGATIVE

## 2020-08-11 LAB — PROCALCITONIN: Procalcitonin: 4.11 ng/mL

## 2020-08-11 LAB — D-DIMER, QUANTITATIVE: D-Dimer, Quant: 5.32 ug/mL-FEU — ABNORMAL HIGH (ref 0.00–0.50)

## 2020-08-11 MED ORDER — VANCOMYCIN HCL 1250 MG/250ML IV SOLN: 1250.0000 mg | INTRAVENOUS | Status: DC

## 2020-08-11 MED ORDER — ACETAMINOPHEN 650 MG RE SUPP
650.0000 mg | Freq: Once | RECTAL | Status: AC
  Administered 2020-08-11: 650 mg via RECTAL
  Filled 2020-08-11: qty 1

## 2020-08-11 MED ORDER — ACETAMINOPHEN 650 MG RE SUPP: 650.0000 mg | Freq: Once | RECTAL | Status: DC

## 2020-08-11 MED ORDER — SODIUM CHLORIDE 0.9 % IV SOLN
250.0000 mL | INTRAVENOUS | Status: DC
  Administered 2020-08-11: 250 mL via INTRAVENOUS

## 2020-08-11 MED ORDER — MIDAZOLAM HCL 2 MG/2ML IJ SOLN
2.0000 mg | INTRAMUSCULAR | Status: DC | PRN
  Administered 2020-08-12 – 2020-08-13 (×2): 2 mg via INTRAVENOUS
  Filled 2020-08-11 (×7): qty 2

## 2020-08-11 MED ORDER — PANTOPRAZOLE SODIUM 40 MG IV SOLR
40.0000 mg | INTRAVENOUS | Status: DC
  Administered 2020-08-11 – 2020-08-15 (×5): 40 mg via INTRAVENOUS
  Filled 2020-08-11 (×5): qty 40

## 2020-08-11 MED ORDER — NOREPINEPHRINE 4 MG/250ML-% IV SOLN
2.0000 ug/min | INTRAVENOUS | Status: DC
  Administered 2020-08-11: 5 ug/min via INTRAVENOUS
  Administered 2020-08-12: 3 ug/min via INTRAVENOUS
  Filled 2020-08-11: qty 250

## 2020-08-11 MED ORDER — FENTANYL CITRATE (PF) 100 MCG/2ML IJ SOLN
50.0000 ug | INTRAMUSCULAR | Status: DC | PRN
  Administered 2020-08-13 (×2): 50 ug via INTRAVENOUS

## 2020-08-11 MED ORDER — PROPOFOL 1000 MG/100ML IV EMUL
0.0000 ug/kg/min | INTRAVENOUS | Status: DC
  Administered 2020-08-11 – 2020-08-12 (×3): 40 ug/kg/min via INTRAVENOUS
  Filled 2020-08-11 (×2): qty 100

## 2020-08-11 MED ORDER — POLYETHYLENE GLYCOL 3350 17 G PO PACK
17.0000 g | PACK | Freq: Every day | ORAL | Status: DC
  Administered 2020-08-11 – 2020-08-13 (×3): 17 g
  Filled 2020-08-11 (×2): qty 1

## 2020-08-11 MED ORDER — HEPARIN SODIUM (PORCINE) 5000 UNIT/ML IJ SOLN
5000.0000 [IU] | Freq: Three times a day (TID) | INTRAMUSCULAR | Status: DC
  Administered 2020-08-11: 5000 [IU] via SUBCUTANEOUS
  Filled 2020-08-11: qty 1

## 2020-08-11 MED ORDER — PANTOPRAZOLE SODIUM 40 MG IV SOLR
40.0000 mg | INTRAVENOUS | Status: DC
  Administered 2020-08-11: 40 mg via INTRAVENOUS
  Filled 2020-08-11: qty 40

## 2020-08-11 MED ORDER — VANCOMYCIN HCL 1750 MG/350ML IV SOLN
1750.0000 mg | Freq: Once | INTRAVENOUS | Status: AC
  Administered 2020-08-11: 1750 mg via INTRAVENOUS
  Filled 2020-08-11: qty 350

## 2020-08-11 MED ORDER — CHLORHEXIDINE GLUCONATE 0.12% ORAL RINSE (MEDLINE KIT)
15.0000 mL | Freq: Two times a day (BID) | OROMUCOSAL | Status: DC
  Administered 2020-08-11 – 2020-08-20 (×15): 15 mL via OROMUCOSAL

## 2020-08-11 MED ORDER — NOREPINEPHRINE 4 MG/250ML-% IV SOLN
0.0000 ug/min | INTRAVENOUS | Status: DC
  Administered 2020-08-11: 5 ug/min via INTRAVENOUS
  Filled 2020-08-11: qty 250

## 2020-08-11 MED ORDER — ENOXAPARIN SODIUM 40 MG/0.4ML ~~LOC~~ SOLN
40.0000 mg | SUBCUTANEOUS | Status: DC
  Administered 2020-08-11 – 2020-08-20 (×10): 40 mg via SUBCUTANEOUS
  Filled 2020-08-11 (×10): qty 0.4

## 2020-08-11 MED ORDER — MIDAZOLAM HCL 2 MG/2ML IJ SOLN
2.0000 mg | INTRAMUSCULAR | Status: DC | PRN
  Administered 2020-08-12 – 2020-08-14 (×5): 2 mg via INTRAVENOUS
  Filled 2020-08-11: qty 2

## 2020-08-11 MED ORDER — NOREPINEPHRINE 4 MG/250ML-% IV SOLN
INTRAVENOUS | Status: AC
  Administered 2020-08-11: 2 ug/min via INTRAVENOUS
  Filled 2020-08-11: qty 250

## 2020-08-11 MED ORDER — CHLORHEXIDINE GLUCONATE CLOTH 2 % EX PADS
6.0000 | MEDICATED_PAD | Freq: Every day | CUTANEOUS | Status: DC
  Administered 2020-08-11 – 2020-08-21 (×10): 6 via TOPICAL

## 2020-08-11 MED ORDER — DOCUSATE SODIUM 50 MG/5ML PO LIQD
100.0000 mg | Freq: Two times a day (BID) | ORAL | Status: DC
  Administered 2020-08-11 – 2020-08-13 (×5): 100 mg
  Filled 2020-08-11 (×5): qty 10

## 2020-08-11 MED ORDER — ORAL CARE MOUTH RINSE
15.0000 mL | OROMUCOSAL | Status: DC
  Administered 2020-08-11 – 2020-08-14 (×26): 15 mL via OROMUCOSAL

## 2020-08-11 MED ORDER — INSULIN ASPART 100 UNIT/ML ~~LOC~~ SOLN
0.0000 [IU] | SUBCUTANEOUS | Status: DC
  Administered 2020-08-11 – 2020-08-12 (×2): 3 [IU] via SUBCUTANEOUS
  Administered 2020-08-12: 2 [IU] via SUBCUTANEOUS
  Administered 2020-08-12: 3 [IU] via SUBCUTANEOUS
  Administered 2020-08-12: 2 [IU] via SUBCUTANEOUS
  Administered 2020-08-12: 5 [IU] via SUBCUTANEOUS
  Administered 2020-08-12: 3 [IU] via SUBCUTANEOUS
  Administered 2020-08-13: 5 [IU] via SUBCUTANEOUS
  Administered 2020-08-13: 8 [IU] via SUBCUTANEOUS
  Administered 2020-08-13: 5 [IU] via SUBCUTANEOUS
  Administered 2020-08-13 (×2): 8 [IU] via SUBCUTANEOUS
  Administered 2020-08-13: 5 [IU] via SUBCUTANEOUS
  Administered 2020-08-14: 3 [IU] via SUBCUTANEOUS
  Administered 2020-08-14: 2 [IU] via SUBCUTANEOUS
  Administered 2020-08-14: 3 [IU] via SUBCUTANEOUS
  Administered 2020-08-14: 2 [IU] via SUBCUTANEOUS
  Administered 2020-08-14: 3 [IU] via SUBCUTANEOUS
  Administered 2020-08-14: 8 [IU] via SUBCUTANEOUS
  Administered 2020-08-15: 2 [IU] via SUBCUTANEOUS
  Administered 2020-08-15 (×4): 3 [IU] via SUBCUTANEOUS
  Administered 2020-08-16: 2 [IU] via SUBCUTANEOUS
  Administered 2020-08-16 (×4): 3 [IU] via SUBCUTANEOUS
  Administered 2020-08-16 – 2020-08-17 (×2): 2 [IU] via SUBCUTANEOUS

## 2020-08-11 MED ORDER — SODIUM CHLORIDE 0.9 % IV SOLN
2.0000 g | Freq: Two times a day (BID) | INTRAVENOUS | Status: DC
  Administered 2020-08-11 – 2020-08-13 (×4): 2 g via INTRAVENOUS
  Filled 2020-08-11 (×4): qty 2

## 2020-08-11 MED ORDER — FENTANYL CITRATE (PF) 100 MCG/2ML IJ SOLN
50.0000 ug | INTRAMUSCULAR | Status: AC | PRN
  Administered 2020-08-12 (×3): 50 ug via INTRAVENOUS

## 2020-08-11 NOTE — Consult Note (Addendum)
NAME:  [name], MRN:  [id number], DOB:  [birth year], LOS: 1 ADMISSION DATE:  08/10/2020, CONSULTATION DATE:  1/11 REFERRING MD:  EDP/ [facility] CHIEF COMPLAINT:  resp failure/intubated    Brief History:   ED 1/10 pm  with sob.  Pt was admitted from 1/3 to 1/6 for Covid pneumonia - 1st symptoms around 07/25/20 .  Pt also had a CHF exacerbation while there and had an ECHO which showed an EF of 20-25%.  She was seen by cardiology in the hospital.  Plan for cardiac cath vs coronary artery CT as an outpatient.  Pt developed a rash to decadron.  Pt received 4 days of remdesivir in the hospital.  She was d/c on room air.   Pm 1/10  pt developed worsening sob.  The pt is unable to g  hx due to extreme sob. She had O2 sats of 72% on RA.  She was placed on a 100% NRB and sats only went up to 88% > intubated at [facility] ER  and plan transfer to [facility]  Past Medical History:  Arthritis, Chronic back pain,  Diabetes mellitus GERD  HTN Colonic polyps Mitral valve prolapse   has a past medical history of Arthritis, Chronic back pain, Diabetes mellitus without complication ([place]), GERD (gastroesophageal reflux disease), History of bronchitis (> 8 yrs ago), History of shingles, HTN (hypertension), colonic polyps, Internal and external hemorrhoids without complication, Joint pain, Mitral valve prolapse, Nocturia, PONV (postoperative nausea and vomiting), Restless leg syndrome, Seasonal allergies, Urinary frequency, and Uterine fibroid.   has a past surgical history that includes Hysteroscopy with D & C (08/23/2000); Lumbar disc surgery (03/16/2005); Plantar fascia release (Left, 02/03/2010); Hemorrhoid surgery; Tonsillectomy; Wrist surgery; Tubal ligation; Colonoscopy (07/12/2004); Bunionectomy (Bilateral); Colonoscopy; Lumbar epidural injection; Finger arthroscopy with carpometacarpel(cmc) arthroplasty (Right, 09/28/2015); and Tendon transfer (Right, 09/28/2015).   There is no immunization history on file for this  patient.   Significant Hospital Events:   intubated in ER pm 1/10 with immediate pink frothy secretions   Consults:  PCCM  Procedures:  Oral ET 1/10 >>>  Significant Diagnostic Tests:    Micro Data:    Recent Results (from the past 720 hour(s))  Blood Culture (routine x 2)     Status: None   Collection Time: 08/03/20  1:57 PM   Specimen: BLOOD  Result Value Ref Range Status   Specimen Description   Final    BLOOD RIGHT ANTECUBITAL Performed at [facility] [place] [address], [place], [place] [address]    Special Requests   Final    BOTTLES DRAWN AEROBIC AND ANAEROBIC Blood Culture results may not be optimal due to an inadequate volume of blood received in culture bottles Performed at [facility] [address], [place], [place] [address]    Culture   Final    NO GROWTH 5 DAYS Performed at [facility], [place] [address] [place], [place] [address]    Report Status 08/08/2020 FINAL  Final  Blood Culture (routine x 2)     Status: None   Collection Time: 08/03/20  1:57 PM   Specimen: BLOOD RIGHT FOREARM  Result Value Ref Range Status   Specimen Description   Final    BLOOD RIGHT FOREARM Performed at [place] [place] [address] [place], [place] [address]    Special Requests   Final    BOTTLES DRAWN AEROBIC AND ANAEROBIC Blood Culture results may not be optimal due to an  inadequate volume of blood received in culture bottles Performed at [facility] [address], [place], [place] [address]    Culture   Final    NO GROWTH 5 DAYS Performed at [facility], [place] [address] [place], [place] [address]    Report Status 08/08/2020 FINAL  Final     Antimicrobials:   Scheduled Meds: . chlorhexidine gluconate (MEDLINE KIT)  15 mL Mouth Rinse BID  . Chlorhexidine Gluconate Cloth  6 each Topical Daily  . heparin injection (subcutaneous)  5,000 Units Subcutaneous Q8H  . mouth rinse   15 mL Mouth Rinse 10 times per day  . pantoprazole (PROTONIX) IV  40 mg Intravenous Q24H   Continuous Infusions: . norepinephrine (LEVOPHED) Adult infusion 5 mcg/min (08/11/20 1347)  . propofol (DIPRIVAN) infusion 50 mcg/kg/min (08/11/20 1329)   PRN Meds:.    Interim History / Subjective:    08/11/2020- arrived at [facility] long ICU bed 1225. Nurse reports patient sync with vent 60% fio2 /peep 5. but febrile - 103F. On diprivan and levophed.   Objective   Blood pressure (!) 118/56, pulse 82, temperature (!) 101.12 F (38.4 C), resp. rate (!) 25, height 5' 5"  (1.651 m), weight 92 kg, SpO2 95 %.    Vent Mode: PRVC FiO2 (%):  [60 %-75 %] 60 % Set Rate:  [20 bmp-25 bmp] 25 bmp Vt Set:  [450 mL-550 mL] 450 mL PEEP:  [5 cmH20] 5 cmH20 Plateau Pressure:  [15 cmH20-27 cmH20] 15 cmH20   Intake/Output Summary (Last 24 hours) at 08/11/2020 1650 Last data filed at 08/11/2020 0830 Gross per 24 hour  Intake -  Output 900 ml  Net -900 ml   Filed Weights   08/10/20 2213  Weight: 92 kg    General Appearance:  Looks criticall ill OBESE - + Head:  Normocephalic, without obvious abnormality, atraumatic Eyes:  PERRL - yes, conjunctiva/corneas - mudd     Ears:  Normal external ear canals, both ears Nose:  G tube - no Throat:  ETT TUBE - yes , OG tube - yes Neck:  Supple,  No enlargement/tenderness/nodules Lungs: Clear to auscultation bilaterally, Ventilator   Synchrony - ues, 60% fio2, peep 5 Heart:  S1 and S2 normal, no murmur, CVP - x.  Pressors - levophed Abdomen:  Soft, no masses, no organomegaly Genitalia / Rectal:  Not done Extremities:  Extremities- intact Skin:  ntact in exposed areas . Sacral area - not examined Neurologic:  Sedation - diprivan gtt -> RASS - -3 . Moves all 4s - yes. CAM-ICU - na . Orientation - not but coughs and respnds to stimulii per [facility] Problem list     Assessment & Plan:   1)  Acute hypercabic/ hypoxemic RF in covid pt just d/c  on RA with cxr c/w pulmonary edema in pt with decreased EF on recent echo and BNP > 1200 but nl troponin >>>  Improved on vent but now levophed dep shock so unable to do much in way of diuresis for now   2) Covid 19 pna in unvaccinated elderly lady high risk for death -  Onset of symptoms was around Christmas day 2021 s/p remdesovir rx/ could not use dex due to rash p rx Therefore  out already x 15 days at least from original infection and suspect most of the problem is cardiogenic edema, perhaps aggravated by leaky capillaries    3) AKI Lab Results  Component Value  Date   CREATININE 1.58 (H) 08/10/2020   CREATININE 1.52 (H) 08/06/2020   CREATININE 1.35 (H) 08/04/2020     has a past medical history of Arthritis, Chronic back pain, Diabetes mellitus without complication ([place]), GERD (gastroesophageal reflux disease), History of bronchitis (> 8 yrs ago), History of shingles, HTN (hypertension), colonic polyps, Internal and external hemorrhoids without complication, Joint pain, Mitral valve prolapse, Nocturia, PONV (postoperative nausea and vomiting), Restless leg syndrome, Seasonal allergies, Urinary frequency, and Uterine fibroid.   has a past surgical history that includes Hysteroscopy with D & C (08/23/2000); Lumbar disc surgery (03/16/2005); Plantar fascia release (Left, 02/03/2010); Hemorrhoid surgery; Tonsillectomy; Wrist surgery; Tubal ligation; Colonoscopy (07/12/2004); Bunionectomy (Bilateral); Colonoscopy; Lumbar epidural injection; Finger arthroscopy with carpometacarpel(cmc) arthroplasty (Right, 09/28/2015); and Tendon transfer (Right, 09/28/2015).   ASSESSMENT / PLAN:  PULMONARY  A:  Acute resp failure due to  Hypoxemia and hypercarbia at re=admission 08/10/2020   - covid admission 1/3 to 1/6 for Covid pneumonia - 1st symptoms around 07/25/20 .  - acute systolic CHF new dx 08/06/08 during first admission  08/11/2020 -> currently possible flash pulm edmea vs HCAP   P:   Full vent  support Vent bundle   NEUROLOGIC A:   Sedation needs on ventilator. Noticed to be fololowing command 08/10/20  08/11/2020 - RASS -3 on diprivan  P:   PAD sedation protcol with diprivan (negative ionotripy noted) as tolerated RASS goal 0 to -3 with vent synchrony    VASCULAR A:   Circulatory shock upon arrival at [facility] + CHF mediated likely? ? Sepsis (febrile)  P:  Levophed for MAP > 65 Hold off fluids Check cortisol  CARDIAC STRUCTURAL A: New onset acute systolic CHF 04/07/03. Needs opd cath per cards   08/11/2020 -currently flash pulm edema v  P:  Check trop Check bnp Check echo again  CARDIAC ELECTRICAL A: Sinus on arriva at [facility] . 20 Bbeat runs of NS Vtach v SVT 17.25 08/11/20  P: Optimize electrolytes - check  Tele monitoring  INFECTIOUS A:   COVID-19 -symptom 12/25. Admitted 1/3 with positive covid REadmitted 1/11 - Febrile 103F + high wbc - SIRS +. Pulmoanry infiltrates  + - concern for HCAP/sepsis  P:   Check pct Check lactate Pan culture empriic abx Check covid IgG  RENAL A:  Baseline creaat  1.30m% - 1.183m2019 andearly Jan  2022  AKI - 1.3571monset 08/04/20 -> worse 08/10/20 to 1.67m72mP:  Optimize BP/HR Ucnelar if fluids v lasix is Rx of choice Avoid nephrotoxins  ELECTROLYTES A:  At risk for electrolyte imbalance  08/11/2020 - K < 4  P: Check mag Check phos Replete K   GASTROINTESTINAL A:   NPO  P:   TF PPI  HEMATOLOGIC   - HEME A:  At risk anemia of critical illness   P:  - PRBC for hgb </= 6.9gm%    - exceptions are   -  if ACS susepcted/confirmed then transfuse for hgb </= 8.0gm%,  or    -  active bleeding with hemodynamic instability, then transfuse regardless of hemoglobin value   At at all times try to transfuse 1 unit prbc as possible with exception of active hemorrhage   HEMATOLOGIC - Platelets A Normal platelets. High risk for DVT/PE and also Lovenox complications of  HITT  P Duplex LE Check HITT Panel in advance Lovenox for DVT prophylaxi  ENDOCRINE A:   DM   P:   ssi  MSK/DERM At  risk for bedsores  Plan  - ROM     Best practice (evaluated daily)  Diet: npo Pain/Anxiety/Delirium protocol (if indicated): diprivan VAP protocol (if indicated):  DVT prophylaxis: Hep sq -> Lovenox GI prophylaxis: PPI Glucose control: SSI Mobility: SBR Disposition: [place] ICU 1225  Updates: husband updated 08/11/2020 6:04 PM from  over phone   Goals of Care:  Last date of multidisciplinary goals of care discussion: Family and staff present:  Summary of discussion:  Follow up goals of care discussion due:  Code Status: full code for now      Orient   The patient [name] is critically ill with multiple organ systems failure and requires high complexity decision making for assessment and support, frequent evaluation and titration of therapies, application of advanced monitoring technologies and extensive interpretation of multiple databases.   Critical Care Time devoted to patient care services described in this note is  60  Minutes. This time reflects time of care of this signee Dr Brand Males. This critical care time does not reflect procedure time, or teaching time or supervisory time of PA/NP/Med student/Med Resident etc but could involve care discussion time     Dr. Brand Males, M.D., [facility]C.P Pulmonary and Critical Care Medicine Staff Physician [facility] Pager: [phone], If no answer or between  15:00h - 7:00h: call [phone]  08/11/2020 4:56 PM    LABS    PULMONARY Recent Labs  Lab 08/10/20 2231 08/11/20 0200  PHART 7.084* 7.382  PCO2ART 79.6* 39.8  PO2ART 148* 133*  HCO3 17.2* 23.4  O2SAT 96.6 98.8    CBC Recent Labs  Lab 08/06/20 0546 08/10/20 2219  HGB 12.4 14.2  HCT 37.9 46.2*  WBC 5.4 16.6*  PLT 279 386     COAGULATION No results for input(s): INR in the last 168 hours.  CARDIAC  No results for input(s): TROPONINI in the last 168 hours. No results for input(s): PROBNP in the last 168 hours.   CHEMISTRY Recent Labs  Lab 08/06/20 0546 08/10/20 2219  NA 138 134*  K 3.8 3.7  CL 102 99  CO2 28 22  GLUCOSE 65* 365*  BUN 50* 24*  CREATININE 1.52* 1.58*  CALCIUM 8.9 8.6*   Estimated Creatinine Clearance: 35 mL/min (A) (by C-G formula based on SCr of 1.58 mg/dL (H)).   LIVER No results for input(s): AST, ALT, ALKPHOS, BILITOT, PROT, ALBUMIN, INR in the last 168 hours.   INFECTIOUS No results for input(s): LATICACIDVEN, PROCALCITON in the last 168 hours.   ENDOCRINE CBG (last 3)  Recent Labs    08/11/20 1447  GLUCAP 235*         IMAGING x48h  - image(s) personally visualized  -   highlighted in bold DG Chest Portable 1 View  Result Date: 08/10/2020 CLINICAL DATA:  COVID positive.  Post intubation. EXAM: PORTABLE CHEST 1 VIEW COMPARISON:  Radiograph earlier today. FINDINGS: Endotracheal tube tip 2.5 cm from the carina. Tip and side port of the enteric tube below the diaphragm in the stomach. Heterogeneous bilateral lung opacities with improvement from earlier today. Stable heart size and mediastinal contours. Possible small pleural effusions. No pneumothorax. IMPRESSION: 1. Endotracheal tube tip 2.5 cm from the carina. Enteric tube in place. 2. Heterogeneous bilateral lung opacities with improvement from earlier today, likely improving pulmonary edema. Underlying pneumonia also considered in the setting of COVID. Possible small pleural effusions. Electronically Signed  By: Keith Rake M.D.   On: 08/10/2020 23:43   DG Chest Port 1 View  Result Date: 08/10/2020 CLINICAL DATA:  75 year old female with shortness of breath. EXAM: PORTABLE CHEST 1 VIEW COMPARISON:  Chest radiograph dated 08/03/2020. FINDINGS: Diffuse interstitial prominence and airspace opacities  throughout the lungs may represent edema, pneumonia, or combination. Clinical correlation is recommended. Small bilateral pleural effusions noted. No pneumothorax. Top-normal cardiac size. Atherosclerotic calcification of the aorta. No acute osseous pathology. IMPRESSION: Diffuse interstitial prominence and airspace opacities may represent edema, pneumonia, or combination. Small bilateral pleural effusions. Electronically Signed   By: Anner Crete M.D.   On: 08/10/2020 22:46

## 2020-08-11 NOTE — ED Provider Notes (Signed)
Patient has been awaiting a bed at [facility] intensive care unit entire shift.  She has been stabilized on the ventilator.  She is currently on pressors and blood pressure is 754 systolic.  Per chart, she had already received full course of remdesivir while on the previous admission.  She has an allergy to steroids and can no longer receive Decadron. Repeat arterial blood gas was improved. Patient awaiting transfer   Ripley Fraise, MD 08/11/20 [phone]

## 2020-08-11 NOTE — Progress Notes (Signed)
Sputum collection completed.

## 2020-08-11 NOTE — Progress Notes (Signed)
Inpatient Diabetes Program Recommendations  AACE/ADA: New Consensus Statement on Inpatient Glycemic Control (2015)  Target Ranges:  Prepandial:   less than 140 mg/dL      Peak postprandial:   less than 180 mg/dL (1-2 hours)      Critically ill patients:  140 - 180 mg/dL   Lab Results  Component Value Date   GLUCAP 139 (H) 08/06/2020   HGBA1C 6.6 (H) 08/03/2020    Review of Glycemic Control Results for [name], [name] (MRN [id number]) as of 08/11/2020 09:55  Ref. Range 08/10/2020 22:19  Glucose Latest Ref Range: 70 - 99 mg/dL 365 (H)   Diabetes history: Type 2 DM Outpatient Diabetes medications: Trulicity 8.25 mg/qwk, Metaglip 50-500 mg BID Current orders for Inpatient glycemic control: none  Inpatient Diabetes Program Recommendations:    Consider adding: -Novolog 2-6 units Q4H under ICU protocol -Levemir 6 units QD -Tradjenta 5 mg QD  Thanks, Bronson Curb, MSN, RNC-OB Diabetes Coordinator [phone] (8a-5p)

## 2020-08-11 NOTE — Consult Note (Signed)
NAME:  [name], MRN:  [id number], DOB:  [birth year], LOS: 1 ADMISSION DATE:  08/10/2020, CONSULTATION DATE:  1/11 REFERRING MD:  EDP/ [facility] CHIEF COMPLAINT:  resp failure/intubated    Brief History:  44 yowf never vaccinated d/c 1/6 for covid pna pm RA and back 1/10 pm in resp extremis req intubation shortly after arrival and initiated plans to trx to [facility].  History of Present Illness:  ED 1/10 pm  with sob.  Pt was admitted from 1/3 to 1/6 for Covid pneumonia - 1st symptoms around 07/25/20 .  Pt also had a CHF exacerbation while there and had an ECHO which showed an EF of 20-25%.  She was seen by cardiology in the hospital.  Plan for cardiac cath vs coronary artery CT as an outpatient.  Pt developed a rash to decadron.  Pt received 4 days of remdesivir in the hospital.  She was d/c on room air.    Pm 1/10  pt developed worsening sob.  The pt is unable to give me  hx due to extreme sob. She had O2 sats of 72% on RA.  She was placed on a 100% NRB and sats only went up to 88% > intubated and plan transfer to [facility]  Past Medical History:  Arthritis, Chronic back pain,  Diabetes mellitus GERD  HTN Colonic polyps Mitral valve prolapse    Significant Hospital Events:   intubated in ER pm 1/10 with immediate pink frothy secretions   Consults:  PCCM  Procedures:  Oral ET 1/10 >>>  Significant Diagnostic Tests:    Micro Data:     Antimicrobials:   Scheduled Meds: Continuous Infusions: . norepinephrine (LEVOPHED) Adult infusion 6 mcg/min (08/11/20 0828)  . propofol (DIPRIVAN) infusion 45 mcg/kg/min (08/11/20 0844)   PRN Meds:.    Interim History / Subjective:  Sedated on diprovan/ levophed for bp   Objective   Blood pressure (!) 101/56, pulse 89, temperature (!) 103.1 F (39.5 C), temperature source Bladder, resp. rate (!) 25, height 5\' 5"  (1.651 m), weight 92 kg, SpO2 95 %.    Vent Mode: PRVC FiO2 (%):  [65 %-75 %] 65 % Set Rate:  [20 bmp-25 bmp] 25 bmp Vt Set:   [450 mL-550 mL] 450 mL PEEP:  [5 cmH20] 5 cmH20 Plateau Pressure:  [19 cmH20-27 cmH20] 19 cmH20   Intake/Output Summary (Last 24 hours) at 08/11/2020 1305 Last data filed at 08/11/2020 0830 Gross per 24 hour  Intake --  Output 900 ml  Net -900 ml   Filed Weights   08/10/20 2213  Weight: 92 kg    Examination: General: acute and chronically ill appearing, not breathing over vent HENT:oral et Lungs: bilatera insp crackles Cardiovascular: SR/  Distant s1s2 Abdomen: soft Extremities: warm s pitting Neuro: sedated    I personally reviewed images and agree with radiology impression as follows:  CXR:   Portable pm 1/10 1. Endotracheal tube tip 2.5 cm from the carina. Enteric tube in place. 2. Heterogeneous bilateral lung opacities with improvement from earlier today, likely improving pulmonary edema. Underlying pneumonia also considered in the setting of COVID. Possible small pleural effusions.   Resolved Hospital Problem list     Assessment & Plan:   1)  Acute hypercabic/ hypoxemic RF in covid pt just d/c on RA with cxr c/w pulmonary edema in pt with decreased EF on recent echo and BNP > 1200 but nl troponin >>>  Improved on vent but now levophed dep shock so unable to do  much in way of diuresis for now   2) Covid 19 pna in unvaccinated elderly lady high risk for death -  Onset of symptoms was around Christmas day 2021 s/p remdesovir rx/ could not use dex due to rash p rx Therefore  out already x 15 days at least from original infection and suspect most of the problem is cardiogenic edema, perhaps aggravated by leaky capillaries    3) AKI Lab Results  Component Value Date   CREATININE 1.58 (H) 08/10/2020   CREATININE 1.52 (H) 08/06/2020   CREATININE 1.35 (H) 08/04/2020    Best practice (evaluated daily)  Diet: npo Pain/Anxiety/Delirium protocol (if indicated): diprovan VAP protocol (if indicated):  DVT prophylaxis: Hep sq GI prophylaxis: PPI Glucose control:  SSI Mobility: SBR Disposition: Process of transfer [place]   Goals of Care:  Last date of multidisciplinary goals of care discussion: Family and staff present:  Summary of discussion:  Follow up goals of care discussion due:  Code Status: full code for now   Labs   CBC: Recent Labs  Lab 08/06/20 0546 08/10/20 2219  WBC 5.4 16.6*  NEUTROABS  --  9.7*  HGB 12.4 14.2  HCT 37.9 46.2*  MCV 92.0 95.7  PLT 279 347    Basic Metabolic Panel: Recent Labs  Lab 08/06/20 0546 08/10/20 2219  NA 138 134*  K 3.8 3.7  CL 102 99  CO2 28 22  GLUCOSE 65* 365*  BUN 50* 24*  CREATININE 1.52* 1.58*  CALCIUM 8.9 8.6*   GFR: Estimated Creatinine Clearance: 35 mL/min (A) (by C-G formula based on SCr of 1.58 mg/dL (H)). Recent Labs  Lab 08/06/20 0546 08/10/20 2219  WBC 5.4 16.6*    Liver Function Tests: No results for input(s): AST, ALT, ALKPHOS, BILITOT, PROT, ALBUMIN in the last 168 hours. No results for input(s): LIPASE, AMYLASE in the last 168 hours. No results for input(s): AMMONIA in the last 168 hours.  ABG    Component Value Date/Time   PHART 7.382 08/11/2020 0200   PCO2ART 39.8 08/11/2020 0200   PO2ART 133 (H) 08/11/2020 0200   HCO3 23.4 08/11/2020 0200   ACIDBASEDEF 1.3 08/11/2020 0200   O2SAT 98.8 08/11/2020 0200     Coagulation Profile: No results for input(s): INR, PROTIME in the last 168 hours.  Cardiac Enzymes: No results for input(s): CKTOTAL, CKMB, CKMBINDEX, TROPONINI in the last 168 hours.  HbA1C: Hgb A1c MFr Bld  Date/Time Value Ref Range Status  08/03/2020 01:57 PM 6.6 (H) 4.8 - 5.6 % Final    Comment:    (NOTE) Pre diabetes:          5.7%-6.4%  Diabetes:              >6.4%  Glycemic control for   <7.0% adults with diabetes   09/21/2015 09:17 AM 9.0 (H) 4.8 - 5.6 % Final    Comment:    (NOTE)         Pre-diabetes: 5.7 - 6.4         Diabetes: >6.4         Glycemic control for adults with diabetes: <7.0     CBG: Recent Labs  Lab  08/05/20 1644 08/05/20 2108 08/06/20 0747 08/06/20 0837 08/06/20 1209  GLUCAP 103* 163* 50* 101* 139*       Past Medical History:  She,  has a past medical history of Arthritis, Chronic back pain, Diabetes mellitus without complication ([place]), GERD (gastroesophageal reflux disease), History of bronchitis (> 8 yrs  ago), History of shingles, HTN (hypertension), colonic polyps, Internal and external hemorrhoids without complication, Joint pain, Mitral valve prolapse, Nocturia, PONV (postoperative nausea and vomiting), Restless leg syndrome, Seasonal allergies, Urinary frequency, and Uterine fibroid.   Surgical History:   Past Surgical History:  Procedure Laterality Date  . BUNIONECTOMY Bilateral   . COLONOSCOPY  07/12/2004   diverticulosis, internal and external hemorrhoids  . COLONOSCOPY    . FINGER ARTHROSCOPY WITH CARPOMETACARPEL [facility]) ARTHROPLASTY Right 09/28/2015   Procedure: RIGHT THUMB TRAPEZIUM EXCISION WITH CARPOMETACARPEL ([place]) ARTHROPLASTY AND TENDON TRANSFER;  Surgeon: Iran Planas, MD;  Location: [facility];  Service: Orthopedics;  Laterality: Right;  . HEMORRHOID SURGERY     almost 40 yrs ago  . HYSTEROSCOPY WITH D & C  08/23/2000   and resectoscopic myomectomy  . LUMBAR DISC SURGERY  03/16/2005  . LUMBAR EPIDURAL INJECTION    . PLANTAR FASCIA RELEASE Left 02/03/2010   and torn tendon  . TENDON TRANSFER Right 09/28/2015   Procedure: TENDON TRANSFER;  Surgeon: Iran Planas, MD;  Location: [facility];  Service: Orthopedics;  Laterality: Right;  . TONSILLECTOMY    . TUBAL LIGATION    . WRIST SURGERY     left, removal of cyst     Social History:   reports that she has never smoked. She has never used smokeless tobacco. She reports that she does not drink alcohol and does not use drugs.   Family History:  Her family history includes Pancreatic cancer in her father. There is no history of Colon cancer or Breast cancer.   Allergies Allergies  Allergen Reactions  . Amlodipine  Swelling  . Depo-Medrol [Methylprednisolone] Hives  . Gabapentin Rash    hives   . Levothyroxine Other (See Comments)    severe muscle pain in neck    . Simvastatin Rash  . Sitagliptin Other (See Comments)    Urinary hesitancy  . Ace Inhibitors     cough  . Betadine [Povidone Iodine]     rash  . Codeine Nausea And Vomiting  . Cortizone-10 [Hydrocortisone] Other (See Comments)    unknown  . Tetracyclines & Related Nausea And Vomiting  . Dexamethasone Rash     Home Medications  Prior to Admission medications   Medication Sig Start Date End Date Taking? Authorizing Provider  allopurinol (ZYLOPRIM) 100 MG tablet Take 100 mg by mouth daily. 07/06/20   [provider]  amLODipine (NORVASC) 5 MG tablet Take 5 mg by mouth daily.    [provider]  aspirin 81 MG tablet Take 81 mg by mouth daily.    [provider]  carvedilol (COREG) 3.125 MG tablet Take 1 tablet (3.125 mg total) by mouth 2 (two) times daily with a meal. 08/06/20   Oswald Hillock, MD  cholecalciferol (VITAMIN D) 1000 UNITS tablet Take 1,000 Units by mouth daily.    [provider]  cycloSPORINE (RESTASIS) 0.05 % ophthalmic emulsion Place 1 drop into both eyes 2 (two) times daily.    [provider]  diphenhydrAMINE (BENADRYL) 25 MG tablet Take 1 tablet (25 mg total) by mouth every 8 (eight) hours as needed for itching. 08/06/20   Oswald Hillock, MD  Dulaglutide 0.75 MG/0.5ML SOPN Inject 0.75 mg into the skin once a week.    [provider]  fluticasone (FLONASE) 50 MCG/ACT nasal spray Place 1 spray into both nostrils daily as needed for allergies. 01/11/20   [provider]  glipiZIDE-metformin (METAGLIP) 5-500 MG tablet Take 1 tablet by  mouth 2 (two) times daily before a meal.    [provider]  guaiFENesin-dextromethorphan (ROBITUSSIN DM) 100-10 MG/5ML syrup Take 5 mLs by mouth every 6 (six) hours as needed for cough. 08/06/20   Oswald Hillock, MD  Magnesium  250 MG TABS Take 1 tablet by mouth daily.    [provider]  Multiple Vitamin (MULTIVITAMIN WITH MINERALS) TABS tablet Take 1 tablet by mouth daily.    [provider]  NUCYNTA 50 MG tablet Take 50 mg by mouth at bedtime. 06/09/20   [provider]  omeprazole (PRILOSEC) 40 MG capsule Take 40 mg by mouth daily.    [provider]  ondansetron (ZOFRAN) 4 MG tablet Take 1 tablet (4 mg total) by mouth every 8 (eight) hours as needed for nausea or vomiting. Patient not taking: Reported on 08/06/2020 09/28/15   Iran Planas, MD  pregabalin (LYRICA) 100 MG capsule Take 100 mg by mouth 2 (two) times daily. 07/21/20   [provider]  rOPINIRole (REQUIP) 2 MG tablet Take 2 mg by mouth at bedtime.  06/26/15   [provider]  zinc sulfate 220 (50 Zn) MG capsule Take 1 capsule (220 mg total) by mouth daily. 08/07/20   Oswald Hillock, MD  famotidine (PEPCID) 20 MG tablet Take 1 tablet (20 mg total) by mouth 2 (two) times daily. Patient not taking: No sig reported 07/15/16 08/03/20  Larene Pickett, PA-C      The patient is critically ill with multiple organ systems failure and requires high complexity decision making for assessment and support, frequent evaluation and titration of therapies, application of advanced monitoring technologies and extensive interpretation of multiple databases. Critical Care Time devoted to patient care services described in this note is 45 minutes.   Christinia Gully, MD Pulmonary and [place] [phone]   After 7:00 pm call Elink  [phone]

## 2020-08-11 NOTE — Progress Notes (Signed)
Pharmacy Antibiotic Note  [name] is a 76 y.o. female admitted on 08/10/2020 with pneumonia.  Pharmacy has been consulted for Vancomycin & Cefepime dosing.  Plan: Cefepime 2gm q12 - Cl sl > 30 ml/min, SCr increasing, adjust to q24 if renal fx worsens Vancomycin 1750mg  x1, then 1250mg  q48h Discontinue Vancomycin if MRSA PCR is negative, sent 1/11   Lovenox 40mg  SQ q24, BMI < 35, Cl > 30 ml/min, adjust if renal fx worsens  Height: 5\' 5"  (165.1 cm) Weight: 92 kg (202 lb 13.2 oz) IBW/kg (Calculated) : 57  Temp (24hrs), Avg:101.4 F (38.6 C), Min:99.7 F (37.6 C), Max:103.1 F (39.5 C)  Recent Labs  Lab 08/06/20 0546 08/10/20 2219  WBC 5.4 16.6*  CREATININE 1.52* 1.58*    Estimated Creatinine Clearance: 35 mL/min (A) (by C-G formula based on SCr of 1.58 mg/dL (H)).    Allergies  Allergen Reactions  . Amlodipine Swelling  . Depo-Medrol [Methylprednisolone] Hives  . Gabapentin Rash    hives   . Levothyroxine Other (See Comments)    severe muscle pain in neck    . Simvastatin Rash  . Sitagliptin Other (See Comments)    Urinary hesitancy  . Ace Inhibitors     cough  . Betadine [Povidone Iodine]     rash  . Codeine Nausea And Vomiting  . Cortizone-10 [Hydrocortisone] Other (See Comments)    unknown  . Tetracyclines & Related Nausea And Vomiting  . Dexamethasone Rash    Antimicrobials this admission: 1/11 Cefepime >>  1/11 Vancomycin >>   Dose adjustments this admission:  Microbiology results: 1/11 MRSA PCR: sent  Thank you for allowing pharmacy to be a part of this patient's care.  Minda Ditto PharmD 08/11/2020 6:00 PM

## 2020-08-11 NOTE — Progress Notes (Signed)
Notified LAB that ABG being sent for analysis.

## 2020-08-11 NOTE — ED Notes (Signed)
[facility] at bedside 

## 2020-08-12 ENCOUNTER — Inpatient Hospital Stay (HOSPITAL_COMMUNITY): Payer: Medicare Other

## 2020-08-12 DIAGNOSIS — J9601 Acute respiratory failure with hypoxia: Secondary | ICD-10-CM | POA: Diagnosis not present

## 2020-08-12 DIAGNOSIS — R6521 Severe sepsis with septic shock: Secondary | ICD-10-CM | POA: Diagnosis not present

## 2020-08-12 DIAGNOSIS — J9602 Acute respiratory failure with hypercapnia: Secondary | ICD-10-CM | POA: Diagnosis not present

## 2020-08-12 DIAGNOSIS — A419 Sepsis, unspecified organism: Principal | ICD-10-CM

## 2020-08-12 DIAGNOSIS — R609 Edema, unspecified: Secondary | ICD-10-CM

## 2020-08-12 LAB — CBC
HCT: 37.8 % (ref 36.0–46.0)
Hemoglobin: 12.5 g/dL (ref 12.0–15.0)
MCH: 29.7 pg (ref 26.0–34.0)
MCHC: 33.1 g/dL (ref 30.0–36.0)
MCV: 89.8 fL (ref 80.0–100.0)
Platelets: 257 10*3/uL (ref 150–400)
RBC: 4.21 MIL/uL (ref 3.87–5.11)
RDW: 14.5 % (ref 11.5–15.5)
WBC: 15.7 10*3/uL — ABNORMAL HIGH (ref 4.0–10.5)
nRBC: 0 % (ref 0.0–0.2)

## 2020-08-12 LAB — COMPREHENSIVE METABOLIC PANEL
ALT: 60 U/L — ABNORMAL HIGH (ref 0–44)
AST: 66 U/L — ABNORMAL HIGH (ref 15–41)
Albumin: 2.9 g/dL — ABNORMAL LOW (ref 3.5–5.0)
Alkaline Phosphatase: 124 U/L (ref 38–126)
Anion gap: 15 (ref 5–15)
BUN: 30 mg/dL — ABNORMAL HIGH (ref 8–23)
CO2: 20 mmol/L — ABNORMAL LOW (ref 22–32)
Calcium: 8.1 mg/dL — ABNORMAL LOW (ref 8.9–10.3)
Chloride: 101 mmol/L (ref 98–111)
Creatinine, Ser: 1.54 mg/dL — ABNORMAL HIGH (ref 0.44–1.00)
GFR, Estimated: 35 mL/min — ABNORMAL LOW (ref >60)
Glucose, Bld: 215 mg/dL — ABNORMAL HIGH (ref 70–99)
Potassium: 3.3 mmol/L — ABNORMAL LOW (ref 3.5–5.1)
Sodium: 136 mmol/L (ref 135–145)
Total Bilirubin: 0.7 mg/dL (ref 0.3–1.2)
Total Protein: 5.7 g/dL — ABNORMAL LOW (ref 6.5–8.1)

## 2020-08-12 LAB — GLUCOSE, CAPILLARY
Glucose-Capillary: 107 mg/dL — ABNORMAL HIGH (ref 70–99)
Glucose-Capillary: 132 mg/dL — ABNORMAL HIGH (ref 70–99)
Glucose-Capillary: 147 mg/dL — ABNORMAL HIGH (ref 70–99)
Glucose-Capillary: 155 mg/dL — ABNORMAL HIGH (ref 70–99)
Glucose-Capillary: 182 mg/dL — ABNORMAL HIGH (ref 70–99)
Glucose-Capillary: 206 mg/dL — ABNORMAL HIGH (ref 70–99)

## 2020-08-12 LAB — HEPARIN INDUCED PLATELET AB (HIT ANTIBODY): Heparin Induced Plt Ab: 0.123 OD (ref 0.000–0.400)

## 2020-08-12 LAB — LACTIC ACID, PLASMA: Lactic Acid, Venous: 1.3 mmol/L (ref 0.5–1.9)

## 2020-08-12 LAB — MAGNESIUM: Magnesium: 1.7 mg/dL (ref 1.7–2.4)

## 2020-08-12 LAB — D-DIMER, QUANTITATIVE: D-Dimer, Quant: 3.75 ug/mL-FEU — ABNORMAL HIGH (ref 0.00–0.50)

## 2020-08-12 LAB — TROPONIN I (HIGH SENSITIVITY): Troponin I (High Sensitivity): 36 ng/L — ABNORMAL HIGH (ref <18)

## 2020-08-12 LAB — PHOSPHORUS: Phosphorus: 3.2 mg/dL (ref 2.5–4.6)

## 2020-08-12 LAB — PROCALCITONIN: Procalcitonin: 3.77 ng/mL

## 2020-08-12 LAB — TRIGLYCERIDES: Triglycerides: 357 mg/dL — ABNORMAL HIGH (ref <150)

## 2020-08-12 LAB — SAR COV2 SEROLOGY (COVID19)AB(IGG),IA: SARS-CoV-2 Ab, IgG: NONREACTIVE

## 2020-08-12 MED ORDER — FUROSEMIDE 10 MG/ML IJ SOLN
20.0000 mg | Freq: Once | INTRAMUSCULAR | Status: AC
  Administered 2020-08-12: 20 mg via INTRAVENOUS
  Filled 2020-08-12: qty 2

## 2020-08-12 MED ORDER — ACETAMINOPHEN 160 MG/5ML PO SOLN
650.0000 mg | Freq: Four times a day (QID) | ORAL | Status: DC | PRN
  Administered 2020-08-12 – 2020-08-13 (×2): 650 mg
  Filled 2020-08-12 (×2): qty 20.3

## 2020-08-12 MED ORDER — VITAL AF 1.2 CAL PO LIQD
1000.0000 mL | ORAL | Status: DC
  Administered 2020-08-12 – 2020-08-13 (×3): 1000 mL

## 2020-08-12 MED ORDER — PROPOFOL 1000 MG/100ML IV EMUL
5.0000 ug/kg/min | INTRAVENOUS | Status: DC
  Administered 2020-08-12: 25 ug/kg/min via INTRAVENOUS
  Administered 2020-08-12: 5 ug/kg/min via INTRAVENOUS
  Administered 2020-08-12: 25 ug/kg/min via INTRAVENOUS
  Administered 2020-08-13: 35 ug/kg/min via INTRAVENOUS
  Administered 2020-08-13: 45 ug/kg/min via INTRAVENOUS
  Filled 2020-08-12 (×6): qty 100

## 2020-08-12 MED ORDER — PROSOURCE TF PO LIQD
45.0000 mL | Freq: Every day | ORAL | Status: DC
  Administered 2020-08-12 – 2020-08-13 (×2): 45 mL
  Filled 2020-08-12 (×3): qty 45

## 2020-08-12 MED ORDER — ACETAMINOPHEN 160 MG/5ML PO SOLN
650.0000 mg | Freq: Four times a day (QID) | ORAL | Status: DC | PRN
  Administered 2020-08-12: 650 mg via ORAL
  Filled 2020-08-12: qty 20.3

## 2020-08-12 MED ORDER — POTASSIUM CHLORIDE 20 MEQ PO PACK
40.0000 meq | PACK | Freq: Once | ORAL | Status: AC
  Administered 2020-08-12: 40 meq
  Filled 2020-08-12: qty 2

## 2020-08-12 MED ORDER — MAGNESIUM SULFATE 2 GM/50ML IV SOLN
2.0000 g | Freq: Once | INTRAVENOUS | Status: AC
  Administered 2020-08-12: 2 g via INTRAVENOUS
  Filled 2020-08-12: qty 50

## 2020-08-12 MED ORDER — VITAL HIGH PROTEIN PO LIQD: 1000.0000 mL | ORAL | Status: DC

## 2020-08-12 MED ORDER — MAGNESIUM SULFATE IN D5W 1-5 GM/100ML-% IV SOLN
1.0000 g | Freq: Once | INTRAVENOUS | Status: AC
  Administered 2020-08-12: 1 g via INTRAVENOUS
  Filled 2020-08-12: qty 100

## 2020-08-12 MED ORDER — FENTANYL 2500MCG IN NS 250ML (10MCG/ML) PREMIX INFUSION
0.0000 ug/h | INTRAVENOUS | Status: DC
  Administered 2020-08-12: 250 ug/h via INTRAVENOUS
  Administered 2020-08-12: 25 ug/h via INTRAVENOUS
  Administered 2020-08-13: 175 ug/h via INTRAVENOUS
  Administered 2020-08-13: 75 ug/h via INTRAVENOUS
  Filled 2020-08-12 (×4): qty 250

## 2020-08-12 NOTE — Progress Notes (Signed)
eLink Physician-Brief Progress Note Patient Name: [name] DOB: 09-16-1945 MRN: [id number]   Date of Service  08/12/2020  HPI/Events of Note  Agitation  eICU Interventions  Plan: 1. Increase ceiling on Fentanyl IV infusion to 300 mcg/min.  2. Please also use already ordered Versed IV PRN.      Intervention Category Major Interventions: Delirium, psychosis, severe agitation - evaluation and management  Lysle Dingwall 08/12/2020, 6:37 AM

## 2020-08-12 NOTE — Progress Notes (Signed)
Bilateral lower extremity venous duplex has been completed. Preliminary results can be found in CV Proc through chart review.   08/12/20 10:03 AM [name] RVT

## 2020-08-12 NOTE — Progress Notes (Signed)
eLink Physician-Brief Progress Note Patient Name: [name] DOB: Jun 06, 1946 MRN: [id number]   Date of Service  08/12/2020  HPI/Events of Note  Hypertriglyceridemia - Triglyceride level = 357. Patient currently on a Propofol IV infusion.   eICU Interventions  Plan: 1. D/C Propofol IV infusion. 2. Fentanyl IV infusion. Titrate to RASS = 0 to -1.      Intervention Category Major Interventions: Other:  Lysle Dingwall 08/12/2020, 2:59 AM

## 2020-08-12 NOTE — Progress Notes (Signed)
eLink Physician-Brief Progress Note Patient Name: [name] DOB: 01-22-1946 MRN: [id number]   Date of Service  08/12/2020  HPI/Events of Note  Hypokalemia  Hypomagnesemia - K+ = 3.3, Mg++ = 1.7 and Creatinine = 1.54.   eICU Interventions  Will replace K+ amd Mg++.     Intervention Category Major Interventions: Electrolyte abnormality - evaluation and management  Kamaljit Hizer [place] 08/12/2020, 1:29 AM

## 2020-08-12 NOTE — Progress Notes (Signed)
Initial Nutrition Assessment  RD working remotely.  DOCUMENTATION CODES:   Obesity unspecified  INTERVENTION:  - will order TF regimen: Vital AF 1.2 @ 30 ml/hr to advance by 10 ml every 4 hours to reach goal rate of 60 ml/hr with 45 ml Prosource TF once/day. - at goal rate, this regimen will provide 1768 kcal, 119 grams protein, and 1168 ml free water.  - free water flush, if desired, to be per CCM.   NUTRITION DIAGNOSIS:   Increased nutrient needs related to acute illness,catabolic illness ([id number] infection) as evidenced by estimated needs  GOAL:   Provide needs based on ASPEN/SCCM guidelines  MONITOR:   Vent status,TF tolerance,Labs,Weight trends  REASON FOR ASSESSMENT:   Ventilator,Consult Enteral/tube feeding initiation and management  ASSESSMENT:   75 year-old female with medical history of COVID-19, arthritis, chronic back pain, CBH, DM, GERD, shingles, HTN, colonic polyps, internal and external hemorrhoids, joint pain, mitral valve prolapse, nocturia, RLS, urinary frequency, and uterine fibroid. She was discharged from the hospital on 1/6 after being admitted on 1/3 due to COVID-19 infections (symptoms began 12/25). She presented to the ED on 1/10 with SOB. She was satting 72% on room air, failed NRB, and was intubated in the [facility] ED prior to transfer to [facility].  Patient remains intubated with OGT in place (gastric placement per abdominal xray report 1/11).   Patient has not been seen by a [place] RD at any point in the past.  Weight on 1/10 was 203 lb, which is up from weight on 1/5 of 201 lb. Prior to that, the most recent weight was on 07/04/18 when she weighed 192 lb.   CCM note from yesterday evening states concern for HCAP and concern for sepsis.    Patient is currently intubated on ventilator support MV: 10.8 L/min Temp (24hrs), Avg:99.4 F (37.4 C), Min:98.6 F (37 C), Max:102 F (38.9 C) Propofol: now off   Labs reviewed; CBGs: 182,  132 mg/dl, K: 3.3 mmol/l, BUN: 30 mg/dl, creatinine: 1.54 mg/dl, Ca: 8.1 mg/dl, LFTs elevated, GFR: 35 ml/min.  Medications reviewed; 100 mg colace BID, sliding scale novolog, 1 g IV Mg sulfate x1 run 1/12, 40 mg IV protonix/day, 17 g miralax/day, 40 mEq Klor-Con x1 dose 1/12.      NUTRITION - FOCUSED PHYSICAL EXAM:  unable to complete at this time  Diet Order:   Diet Order            Diet NPO time specified  Diet effective now                 EDUCATION NEEDS:   Not appropriate for education at this time  Skin:  Skin Assessment: Skin Integrity Issues: Skin Integrity Issues:: Stage I Stage I: R buttocks  Last BM:  PTA/unknown  Height:   Ht Readings from Last 1 Encounters:  08/10/20 5\' 5"  (1.651 m)    Weight:   Wt Readings from Last 1 Encounters:  08/10/20 92 kg     Estimated Nutritional Needs:  Kcal:  1775-1990 kcal Protein:  110-120 grams Fluid:  >/= 2.2 L/day      [name], MS, RD, LDN, CNSC Inpatient Clinical Dietitian RD pager # available in [place]  After hours/weekend pager # available in [facility]

## 2020-08-12 NOTE — Consult Note (Signed)
NAME:  [name], MRN:  [id number], DOB:  [birth year], LOS: 2 ADMISSION DATE:  08/10/2020, CONSULTATION DATE:  1/11 REFERRING MD:  EDP/ [facility] CHIEF COMPLAINT:  resp failure/intubated    Brief History:   ED 1/10 pm  with sob.  Pt was admitted from 1/3 to 1/6 for Covid pneumonia - 1st symptoms around 07/25/20 .  Pt also had a CHF exacerbation while there and had an ECHO which showed an EF of 20-25%.  She was seen by cardiology in the hospital.  Plan for cardiac cath vs coronary artery CT as an outpatient.  Pt developed a rash to decadron.  Pt received 4 days of remdesivir in the hospital.  She was d/c on room air.   Pm 1/10  pt developed worsening sob.  The pt is unable to g  hx due to extreme sob. She had O2 sats of 72% on RA.  She was placed on a 100% NRB and sats only went up to 88% > intubated at [facility] ER  and plan transfer to [facility]  Past Medical History:  Arthritis, Chronic back pain,  Diabetes mellitus GERD  HTN Colonic polyps Mitral valve prolapse   has a past medical history of Arthritis, Chronic back pain, Diabetes mellitus without complication ([place]), GERD (gastroesophageal reflux disease), History of bronchitis (> 8 yrs ago), History of shingles, HTN (hypertension), colonic polyps, Internal and external hemorrhoids without complication, Joint pain, Mitral valve prolapse, Nocturia, PONV (postoperative nausea and vomiting), Restless leg syndrome, Seasonal allergies, Urinary frequency, and Uterine fibroid.   has a past surgical history that includes Hysteroscopy with D & C (08/23/2000); Lumbar disc surgery (03/16/2005); Plantar fascia release (Left, 02/03/2010); Hemorrhoid surgery; Tonsillectomy; Wrist surgery; Tubal ligation; Colonoscopy (07/12/2004); Bunionectomy (Bilateral); Colonoscopy; Lumbar epidural injection; Finger arthroscopy with carpometacarpel(cmc) arthroplasty (Right, 09/28/2015); and Tendon transfer (Right, 09/28/2015).   There is no immunization history on file for this  patient.   Significant Hospital Events:   intubated in ER pm 1/10 with immediate pink frothy secretions   1/11 - arrived at [facility] long ICU bed 1225. Nurse reports patient sync with vent 60% fio2 /peep 5. but febrile - 103F. On diprivan and levophed.   Consults:  PCCM  Procedures:  Oral ET 1/10 >>>  Significant Diagnostic Tests:    Micro Data:    Recent Results (from the past 720 hour(s))  Blood Culture (routine x 2)     Status: None   Collection Time: 08/03/20  1:57 PM   Specimen: BLOOD  Result Value Ref Range Status   Specimen Description   Final    BLOOD RIGHT ANTECUBITAL Performed at [facility] [address], [place], [place] [address]    Special Requests   Final    BOTTLES DRAWN AEROBIC AND ANAEROBIC Blood Culture results may not be optimal due to an inadequate volume of blood received in culture bottles Performed at [facility] [address] [place], [place] [address]    Culture   Final    NO GROWTH 5 DAYS Performed at [facility], [place] [address], [place], [place] [address]    Report Status 08/08/2020 FINAL  Final  Blood Culture (routine x 2)     Status: None   Collection Time: 08/03/20  1:57 PM   Specimen: BLOOD RIGHT FOREARM  Result Value Ref Range Status   Specimen Description   Final    BLOOD RIGHT FOREARM Performed at [facility] [address]  [place], [place] [address]    Special Requests   Final    BOTTLES DRAWN AEROBIC AND ANAEROBIC Blood Culture results may not be optimal due to an inadequate volume of blood received in culture bottles Performed at [facility] [address], [place], [place] [address]    Culture   Final    NO GROWTH 5 DAYS Performed at [facility], [place] [address], [place], [place] [address]    Report Status 08/08/2020 FINAL  Final  MRSA PCR Screening     Status: None   Collection Time: 08/11/20  4:27 PM   Specimen: Nasal  Mucosa; Nasopharyngeal  Result Value Ref Range Status   MRSA by PCR NEGATIVE NEGATIVE Final    Comment:        The GeneXpert MRSA Assay (FDA approved for NASAL specimens only), is one component of a comprehensive MRSA colonization surveillance program. It is not intended to diagnose MRSA infection nor to guide or monitor treatment for MRSA infections. Performed at [facility], [place] [address], [place], [place] [address]   Culture, respiratory (non-expectorated)     Status: None (Preliminary result)   Collection Time: 08/11/20  6:13 PM   Specimen: Tracheal Aspirate; Respiratory  Result Value Ref Range Status   Specimen Description   Final    TRACHEAL ASPIRATE Performed at [facility] [address], [place], [place] [address]    Special Requests   Final    NONE Performed at [facility], [place] [address], [place], [place] [address]    Gram Stain   Final    ABUNDANT WBC PRESENT, PREDOMINANTLY PMN RARE GRAM POSITIVE COCCI Performed at [facility], [place] [address] [place], [place] [address]    Culture PENDING  Incomplete   Report Status PENDING  Incomplete  Culture, blood (Routine X 2) w Reflex to ID Panel     Status: None (Preliminary result)   Collection Time: 08/11/20  7:21 PM   Specimen: BLOOD  Result Value Ref Range Status   Specimen Description   Final    BLOOD RIGHT HAND Performed at [facility] [address], [place], [place] [address]    Special Requests   Final    BOTTLES DRAWN AEROBIC ONLY Blood Culture adequate volume Performed at [facility] [address] [place], [place] [address]    Culture   Final    NO GROWTH < 12 HOURS Performed at [facility] [address] [place], [place] [address]    Report Status PENDING  Incomplete  Culture, blood (Routine X 2) w Reflex to ID Panel     Status: None (Preliminary result)   Collection Time: 08/11/20   7:21 PM   Specimen: BLOOD  Result Value Ref Range Status   Specimen Description   Final    BLOOD RIGHT HAND Performed at [facility] [address] [place], [place] [address]    Special Requests   Final    BOTTLES DRAWN AEROBIC ONLY Blood Culture adequate volume Performed at [facility] [address], [place], [place] [address]    Culture   Final    NO GROWTH < 12 HOURS Performed at [facility] [address] [place], [place] [address]    Report Status PENDING  Incomplete     Antimicrobials:   Anti-infectives (From admission, onward)   Start     Dose/Rate Route Frequency  Ordered Stop   08/13/20 2000  vancomycin (VANCOREADY) IVPB 1250 mg/250 mL  Status:  Discontinued        1,250 mg 166.7 mL/hr over 90 Minutes Intravenous Every 48 hours 08/11/20 1835 08/12/20 1123   08/11/20 2000  vancomycin (VANCOREADY) IVPB 1750 mg/350 mL        1,750 mg 175 mL/hr over 120 Minutes Intravenous  Once 08/11/20 1812 08/11/20 2211   08/11/20 1830  ceFEPIme (MAXIPIME) 2 g in sodium chloride 0.9 % 100 mL IVPB        2 g 200 mL/hr over 30 Minutes Intravenous Every 12 hours 08/11/20 1809         Scheduled Meds: . chlorhexidine gluconate (MEDLINE KIT)  15 mL Mouth Rinse BID  . Chlorhexidine Gluconate Cloth  6 each Topical Daily  . docusate  100 mg Per Tube BID  . enoxaparin (LOVENOX) injection  40 mg Subcutaneous Q24H  . feeding supplement (PROSource TF)  45 mL Per Tube Daily  . insulin aspart  0-15 Units Subcutaneous Q4H  . mouth rinse  15 mL Mouth Rinse 10 times per day  . pantoprazole (PROTONIX) IV  40 mg Intravenous Q24H  . polyethylene glycol  17 g Per Tube Daily   Continuous Infusions: . sodium chloride 250 mL (08/11/20 1958)  . ceFEPime (MAXIPIME) IV Stopped (08/12/20 0604)  . feeding supplement (VITAL AF 1.2 CAL) 1,000 mL (08/12/20 1007)  . fentaNYL infusion INTRAVENOUS 150 mcg/hr (08/12/20 1425)  . norepinephrine (LEVOPHED)  Adult infusion 2 mcg/min (08/12/20 1425)  . propofol (DIPRIVAN) infusion 25 mcg/kg/min (08/12/20 1425)   PRN Meds:.    Interim History / Subjective:    08/12/2020- duplex LE - neg for DVT. On 40% fio2, peep 5. On some levophed, fent gtt, diprivan gtt. cxr some better. Trop fine. TGL 350s. 103F  Objective   Blood pressure (!) 86/39, pulse 68, temperature (!) 101.84 F (38.8 C), resp. rate (!) 25, height _0  (1.651 m), weight 92 kg, SpO2 96 %.    Vent Mode: PRVC FiO2 (%):  [30 %-60 %] 40 % Set Rate:  [25 bmp] 25 bmp Vt Set:  [450 mL] 450 mL PEEP:  [5 cmH20] 5 cmH20 Plateau Pressure:  [10 cmH20-19 cmH20] 19 cmH20   Intake/Output Summary (Last 24 hours) at 08/12/2020 1441 Last data filed at 08/12/2020 1425 Gross per 24 hour  Intake 1555 ml  Output 1010 ml  Net 545 ml   Filed Weights   08/10/20 2213  Weight: 92 kg    General Appearance:  Looks criticall ill OBESE - + Head:  Normocephalic, without obvious abnormality, atraumatic Eyes:  PERRL - yes, conjunctiva/corneas - mudd     Ears:  Normal external ear canals, both ears Nose:  G tube - no Throat:  ETT TUBE - yes , OG tube - yes Neck:  Supple,  No enlargement/tenderness/nodules Lungs: Clear to auscultation bilaterally, Ventilator   Synchrony - ues, 40% fio2, peep 5 Heart:  S1 and S2 normal, no murmur, CVP - x.  Pressors - levophed Abdomen:  Soft, no masses, no organomegaly Genitalia / Rectal:  Not done Extremities:  Extremities- intact Skin:  ntact in exposed areas . Sacral area - not examined Neurologic:  Sedation - diprivan gtt -> RASS - -3 . Moves all 4s - yes. CAM-ICU - na . Orientation - not but coughs and respnds to stimulii per [facility] Problem list     Assessment & Plan:  ASSESSMENT / PLAN:  PULMONARY  A:  Acute resp failure due to  Hypoxemia and hypercarbia at re=admission 08/10/2020   - covid admission 1/3 to 1/6 for Covid pneumonia - 1st symptoms around 07/25/20 .  - acute  systolic CHF new dx 2/0/10 during first admission  08/12/2020 -> currently possible flash pulm edmea  (BNP 1200) vs HCAP ([id number] pending) . Getting near SBT criteria but still febrile   P:   PSV if possible but likely no extubation due to shcok, fever, and high BNP Full vent support Vent bundle   NEUROLOGIC A:   Sedation needs on ventilator. Noticed to be fololowing command 08/10/20  08/12/2020 - RASS -3 on diprivan  P:   PAD sedation protcol with diprivan (negative ionotripy noted) as tolerated RASS goal 0 to -3 with vent synchrony    VASCULAR A:   Circulatory shock upon arrival at [place] + CHF mediated likely? ? Sepsis (febrile)  08/12/2020 - still on levophed Mixture of cardiac and sepsis   P:  Levophed for MAP > 65 Hold off fluids Check cortisol - pending  CARDIAC STRUCTURAL A: New onset acute systolic CHF 0/7/12. Needs opd cath per cards   08/12/2020 -currently flash pulm edema v HCAP. Trop negatie  P: Lasix x 1 Await echo  CARDIAC ELECTRICAL A: Sinus on arriva at [facility] . 20 Bbeat runs of NS Vtach v SVT 17.25 08/11/20  08/12/2020  - no further arrhtmmia reports  P: Optimize electrolytes  Tele monitoring  INFECTIOUS A:   COVID-19 -symptom 12/25. Admitted 1/3 with positive covid. REadmitted 08/11/20. Covid IgG  - negative  REadmitted 1/11 - Febrile 103F + high wbc - SIRS +. Pulmoanry infiltrates  + high PCT  - features are c/w SEptic shock  - likely source HCAP    08/12/2020 - still febrile., cultures in progress. MRSA PCR negatie. All features c/w septic shock. Covid IgG negative suggesting poor prognssi    P:   Await Pan culture empriic abx rec Check covid IgG around 08/20/20 Check urin strep Check urine leg  RENAL A:  Baseline creaat  1.77m% - 1.116m2019 andearly Jan  2022  AKI - 1.3532monset 08/04/20 -> worse 08/10/20 to 1.78m84mnd similar 08/12/20  P:  Optimize BP/HR Give lasix z 11 Avoid  nephrotoxins  ELECTROLYTES A:  At risk for electrolyte imbalance  08/12/2020 - K < 4 and mag < 2  P: Replete K Replete mag  GASTROINTESTINAL A:   NPO  P:   TF PPI  HEMATOLOGIC   - HEME A:  At risk anemia of critical illness   P:  - PRBC for hgb </= 6.9gm%    - exceptions are   -  if ACS susepcted/confirmed then transfuse for hgb </= 8.0gm%,  or    -  active bleeding with hemodynamic instability, then transfuse regardless of hemoglobin value   At at all times try to transfuse 1 unit prbc as possible with exception of active hemorrhage   HEMATOLOGIC - Platelets A Normal platelets. High risk for DVT/PE and also Lovenox complications of HITT. Duplex LE negative dVT 08/11/20  P Await HITT Panel in advance Lovenox for DVT prophylaxis  ENDOCRINE A:   DM   P:   ssi  MSK/DERM At risk for bedsores  Plan  - ROM     Best practice (evaluated daily)  Diet: npo Pain/Anxiety/Delirium protocol (if indicated): diprivan VAP protocol (if indicated):  DVT prophylaxis: Hep sq -> Lovenox GI prophylaxis:  PPI Glucose control: SSI Mobility: SBR Disposition: [place] ICU 1225  Updates: husband updated 08/11/20 and again 08/12/20   Goals of Care:  Last date of multidisciplinary goals of care discussion: Family and staff present:  Summary of discussion:  Follow up goals of care discussion due:  Code Status: full code for now      [place]   The patient [name] is critically ill with multiple organ systems failure and requires high complexity decision making for assessment and support, frequent evaluation and titration of therapies, application of advanced monitoring technologies and extensive interpretation of multiple databases.   Critical Care Time devoted to patient care services described in this note is  40  Minutes. This time reflects time of care of this signee Dr Brand Males. This critical care time does not reflect procedure  time, or teaching time or supervisory time of PA/NP/Med student/Med Resident etc but could involve care discussion time     Dr. Brand Males, M.D., [facility].C.P Pulmonary and Critical Care Medicine Staff Physician [place] [facility] Pager: [phone], If no answer or between  15:00h - 7:00h: call [phone]  08/12/2020 3:00 PM     LABS    PULMONARY Recent Labs  Lab 08/10/20 2231 08/11/20 0200 08/11/20 1753  PHART 7.084* 7.382 7.425  PCO2ART 79.6* 39.8 38.2  PO2ART 148* 133* 114*  HCO3 17.2* 23.4 24.6  O2SAT 96.6 98.8 97.9    CBC Recent Labs  Lab 08/10/20 2219 08/11/20 1907 08/12/20 0032  HGB 14.2 12.7 12.5  HCT 46.2* 39.7 37.8  WBC 16.6* 16.7* 15.7*  PLT 386 264 257    COAGULATION No results for input(s): INR in the last 168 hours.  CARDIAC  No results for input(s): TROPONINI in the last 168 hours. No results for input(s): PROBNP in the last 168 hours.   CHEMISTRY Recent Labs  Lab 08/06/20 0546 08/10/20 2219 08/11/20 1907 08/12/20 0032  NA 138 134* 138 136  K 3.8 3.7 3.7 3.3*  CL 102 99 103 101  CO2 28 22 21* 20*  GLUCOSE 65* 365* 246* 215*  BUN 50* 24* 31* 30*  CREATININE 1.52* 1.58* 1.64* 1.54*  CALCIUM 8.9 8.6* 8.3* 8.1*  MG  --   --  1.6* 1.7  PHOS  --   --  3.8 3.2   Estimated Creatinine Clearance: 35.9 mL/min (A) (by C-G formula based on SCr of 1.54 mg/dL (H)).   LIVER Recent Labs  Lab 08/11/20 1907 08/12/20 0032  AST 57* 66*  ALT 62* 60*  ALKPHOS 128* 124  BILITOT 0.7 0.7  PROT 5.7* 5.7*  ALBUMIN 2.9* 2.9*     INFECTIOUS Recent Labs  Lab 08/11/20 1907 08/12/20 0032  LATICACIDVEN 1.8 1.3  PROCALCITON 4.11 3.77     ENDOCRINE CBG (last 3)  Recent Labs    08/12/20 0305 08/12/20 0803 08/12/20 1148  GLUCAP 182* 132* 107*         IMAGING x48h  - image(s) personally visualized  -   highlighted in bold DG Abd 1 View  Result Date: 08/11/2020 CLINICAL DATA:  NG tube  placement EXAM: ABDOMEN - 1 VIEW COMPARISON:  None. FINDINGS: NG tube is in the distal stomach. IMPRESSION: NG tube in the stomach. Electronically Signed   By: Rolm Baptise M.D.   On: 08/11/2020 19:24   DG CHEST PORT 1 VIEW  Result Date: 08/12/2020 CLINICAL DATA:  75 year old female COVID-18.  Intubated.  EXAM: PORTABLE CHEST 1 VIEW COMPARISON:  Portable chest 08/10/2020 and earlier. FINDINGS: Portable AP semi upright view at 0501 hours. Endotracheal tube tip appears stable terminating above the carina. Internal and external portions of the enteric tube are visible, internal configuration appears stable with tip not included. More rotated to the left. Mediastinal contours remain normal. Coarse and confluent bilateral upper lung opacity has regressed, although bilateral lower lung veiling opacity has increased. No pneumothorax or chest wall gas. Paucity of bowel gas in the upper abdomen. No acute osseous abnormality identified. IMPRESSION: 1.  Stable lines and tubes. 2. Bilateral upper lung pneumonia appears regressed since 08/10/2020, although increased bilateral pleural effusions are suspected. Electronically Signed   By: Genevie Ann M.D.   On: 08/12/2020 05:39   DG Chest Portable 1 View  Result Date: 08/10/2020 CLINICAL DATA:  COVID positive.  Post intubation. EXAM: PORTABLE CHEST 1 VIEW COMPARISON:  Radiograph earlier today. FINDINGS: Endotracheal tube tip 2.5 cm from the carina. Tip and side port of the enteric tube below the diaphragm in the stomach. Heterogeneous bilateral lung opacities with improvement from earlier today. Stable heart size and mediastinal contours. Possible small pleural effusions. No pneumothorax. IMPRESSION: 1. Endotracheal tube tip 2.5 cm from the carina. Enteric tube in place. 2. Heterogeneous bilateral lung opacities with improvement from earlier today, likely improving pulmonary edema. Underlying pneumonia also considered in the setting of COVID. Possible small pleural effusions.  Electronically Signed   By: Keith Rake M.D.   On: 08/10/2020 23:43   DG Chest Port 1 View  Result Date: 08/10/2020 CLINICAL DATA:  75 year old female with shortness of breath. EXAM: PORTABLE CHEST 1 VIEW COMPARISON:  Chest radiograph dated 08/03/2020. FINDINGS: Diffuse interstitial prominence and airspace opacities throughout the lungs may represent edema, pneumonia, or combination. Clinical correlation is recommended. Small bilateral pleural effusions noted. No pneumothorax. Top-normal cardiac size. Atherosclerotic calcification of the aorta. No acute osseous pathology. IMPRESSION: Diffuse interstitial prominence and airspace opacities may represent edema, pneumonia, or combination. Small bilateral pleural effusions. Electronically Signed   By: Anner Crete M.D.   On: 08/10/2020 22:46   VAS [place] LOWER EXTREMITY VENOUS (DVT)  Result Date: 08/12/2020  Lower Venous DVT Study Indications: Edema.  Risk Factors: COVID 19 positive. Limitations: Poor ultrasound/tissue interface. Comparison Study: No prior studies. Performing Technologist: Oliver Hum RVT  Examination Guidelines: A complete evaluation includes B-mode imaging, spectral Doppler, color Doppler, and power Doppler as needed of all accessible portions of each vessel. Bilateral testing is considered an integral part of a complete examination. Limited examinations for reoccurring indications may be performed as noted. The reflux portion of the exam is performed with the patient in reverse Trendelenburg.  +---------+---------------+---------+-----------+----------+--------------+ RIGHT    CompressibilityPhasicitySpontaneityPropertiesThrombus Aging +---------+---------------+---------+-----------+----------+--------------+ CFV      Full           Yes      Yes                                 +---------+---------------+---------+-----------+----------+--------------+ SFJ      Full                                                         +---------+---------------+---------+-----------+----------+--------------+ FV Prox  Full                                                        +---------+---------------+---------+-----------+----------+--------------+  FV Mid   Full                                                        +---------+---------------+---------+-----------+----------+--------------+ FV DistalFull                                                        +---------+---------------+---------+-----------+----------+--------------+ PFV      Full                                                        +---------+---------------+---------+-----------+----------+--------------+ POP      Full           Yes      Yes                                 +---------+---------------+---------+-----------+----------+--------------+ PTV      Full                                                        +---------+---------------+---------+-----------+----------+--------------+ PERO     Full                                                        +---------+---------------+---------+-----------+----------+--------------+   +---------+---------------+---------+-----------+----------+--------------+ LEFT     CompressibilityPhasicitySpontaneityPropertiesThrombus Aging +---------+---------------+---------+-----------+----------+--------------+ CFV      Full           Yes      Yes                                 +---------+---------------+---------+-----------+----------+--------------+ SFJ      Full                                                        +---------+---------------+---------+-----------+----------+--------------+ FV Prox  Full                                                        +---------+---------------+---------+-----------+----------+--------------+ FV Mid   Full                                                         +---------+---------------+---------+-----------+----------+--------------+  FV DistalFull                                                        +---------+---------------+---------+-----------+----------+--------------+ PFV      Full                                                        +---------+---------------+---------+-----------+----------+--------------+ POP      Full           Yes      Yes                                 +---------+---------------+---------+-----------+----------+--------------+ PTV      Full                                                        +---------+---------------+---------+-----------+----------+--------------+ PERO     Full                                                        +---------+---------------+---------+-----------+----------+--------------+     Summary: RIGHT: - There is no evidence of deep vein thrombosis in the lower extremity.  - No cystic structure found in the popliteal fossa.  LEFT: - There is no evidence of deep vein thrombosis in the lower extremity.  - No cystic structure found in the popliteal fossa.  *See table(s) above for measurements and observations.    Preliminary

## 2020-08-12 NOTE — TOC Initial Note (Signed)
Transition of Care [facility]) - Initial/Assessment Note    Patient Details  Name: KINNLEY PAULSON MRN: [id number] Date of Birth: Feb 27, 1946  Transition of [facility]) CM/SW Contact:    Leeroy Cha, RN Phone Number: 08/12/2020, 7:59 AM  Clinical Narrative:                  1/10 pm  with sob. Pt was admitted from 1/3 to 1/6 for Covid pneumonia - 1st symptoms around 07/25/20 . Pt also had a CHF exacerbation while there and had an ECHO which showed an EF of 20-25%. She was seen by cardiology in the hospital. Plan for cardiac cath vs coronary artery CT as an outpatient. Pt developed a rash to decadron. Pt received 4 days of remdesivir in the hospital. She was d/c on room air.  Pm 1/10  pt developed worsening sob. The pt is unable to g  hx due to extreme sob. She had O2 sats of 72% on RA. She was placed on a 100% NRB and sats only went up to 88% > intubated at [facility] ER  and plan transfer [facility]  Past Medical History:  Arthritis, Chronic back pain,  Diabetes mellitus GERD  HTN Colonic polyps Mitral valve prolapse   has a past medical history of Arthritis, Chronic back pain, Diabetes mellitus without complication ([place]), GERD (gastroesophageal reflux disease), History of bronchitis (> 8 yrs ago), History of shingles, HTN (hypertension), colonic polyps, Internal and external hemorrhoids without complication, Joint pain, Mitral valve prolapse, Nocturia, PONV (postoperative nausea and vomiting), Restless leg syndrome, Seasonal allergies, Urinary frequency, and Uterine fibroid.   has a past surgical history that includes Hysteroscopy with D & C (08/23/2000); Lumbar disc surgery (03/16/2005); Plantar fascia release (Left, 02/03/2010); Hemorrhoid surgery; Tonsillectomy; Wrist surgery; Tubal ligation; Colonoscopy (07/12/2004); Bunionectomy (Bilateral); Colonoscopy; Lumbar epidural injection; Finger arthroscopy with carpometacarpel(cmc) arthroplasty (Right, 09/28/2015); and Tendon transfer (Right,  09/28/2015).   There is no immunization history on file for this patient.   Significant Hospital Events:   intubated in ER pm 1/10 with immediate pink frothy secretions   Consults:  PCCM  Procedures:  Oral ET 1/10 >>> PLAN: TO RETURN TO HOME WITH HUSBAND PROGRESSION: AS ABOVE READMISSION FROM [id number] WITH COVID.  Expected Discharge Plan: Home/Self Care Barriers to Discharge: Continued Medical Work up   Patient Goals and CMS Choice Patient states their goals for this hospitalization and ongoing recovery are:: on vent unable to access      Expected Discharge Plan and Services Expected Discharge Plan: Home/Self Care   Discharge Planning Services: CM Consult   Living arrangements for the past 2 months: Single Family Home                                      Prior Living Arrangements/Services Living arrangements for the past 2 months: Single Family Home Lives with:: Spouse Patient language and need for interpreter reviewed:: Yes        Need for Family Participation in Patient Care: Yes (Comment) (spouse) Care giver support system in place?: Yes (comment)   Criminal Activity/Legal Involvement Pertinent to Current Situation/Hospitalization: No - Comment as needed  Activities of Daily Living      Permission Sought/Granted                  Emotional Assessment Appearance:: Appears stated age Attitude/Demeanor/Rapport: Unable to Assess Affect (typically observed): Unable to Assess  Orientation: : Fluctuating Orientation (Suspected and/or reported Sundowners) Alcohol / Substance Use: Not Applicable Psych Involvement: No (comment)  Admission diagnosis:  CHF exacerbation ([place]) [I50.9] Acute respiratory failure with hypoxia and hypercapnia ([facility]) [J96.01, J96.02] Acute on chronic congestive heart failure, unspecified heart failure type ([place]) [I50.9] Pneumonia due to COVID-19 virus [U07.1, J12.82] Patient Active Problem List   Diagnosis Date Noted   . Acute respiratory failure with hypoxia and hypercarbia ([place]) 08/11/2020  . CHF exacerbation ([place]) 08/10/2020  . Pneumonia due to COVID-19 virus 08/03/2020  . GERD (gastroesophageal reflux disease) 05/25/2011  . Hemorrhoids, internal, with bleeding 05/25/2011  . Obesity 05/25/2011   PCP:  Freada Bergeron, MD Pharmacy:   [id number] - [place], [place] [address] Phone: [phone] Fax: [phone]     Social Determinants of Health (SDOH) Interventions    Readmission Risk Interventions No flowsheet data found.

## 2020-08-13 DIAGNOSIS — J1282 Pneumonia due to coronavirus disease 2019: Secondary | ICD-10-CM | POA: Diagnosis not present

## 2020-08-13 DIAGNOSIS — J9602 Acute respiratory failure with hypercapnia: Secondary | ICD-10-CM | POA: Diagnosis not present

## 2020-08-13 DIAGNOSIS — J9601 Acute respiratory failure with hypoxia: Secondary | ICD-10-CM | POA: Diagnosis not present

## 2020-08-13 DIAGNOSIS — U071 COVID-19: Secondary | ICD-10-CM | POA: Diagnosis not present

## 2020-08-13 LAB — GLUCOSE, CAPILLARY
Glucose-Capillary: 248 mg/dL — ABNORMAL HIGH (ref 70–99)
Glucose-Capillary: 255 mg/dL — ABNORMAL HIGH (ref 70–99)
Glucose-Capillary: 218 mg/dL — ABNORMAL HIGH (ref 70–99)
Glucose-Capillary: 216 mg/dL — ABNORMAL HIGH (ref 70–99)
Glucose-Capillary: 243 mg/dL — ABNORMAL HIGH (ref 70–99)
Glucose-Capillary: 269 mg/dL — ABNORMAL HIGH (ref 70–99)
Glucose-Capillary: 266 mg/dL — ABNORMAL HIGH (ref 70–99)

## 2020-08-13 LAB — BASIC METABOLIC PANEL
Anion gap: 12 (ref 5–15)
BUN: 48 mg/dL — ABNORMAL HIGH (ref 8–23)
CO2: 22 mmol/L (ref 22–32)
Calcium: 8.3 mg/dL — ABNORMAL LOW (ref 8.9–10.3)
Chloride: 106 mmol/L (ref 98–111)
Creatinine, Ser: 1.78 mg/dL — ABNORMAL HIGH (ref 0.44–1.00)
GFR, Estimated: 30 mL/min — ABNORMAL LOW (ref >60)
Glucose, Bld: 235 mg/dL — ABNORMAL HIGH (ref 70–99)
Potassium: 3.6 mmol/L (ref 3.5–5.1)
Sodium: 140 mmol/L (ref 135–145)

## 2020-08-13 LAB — CORTISOL: Cortisol, Plasma: 13.7 ug/dL

## 2020-08-13 LAB — STREP PNEUMONIAE URINARY ANTIGEN: Strep Pneumo Urinary Antigen: NEGATIVE

## 2020-08-13 LAB — LEGIONELLA PNEUMOPHILA SEROGP 1 UR AG: L. pneumophila Serogp 1 Ur Ag: NEGATIVE

## 2020-08-13 LAB — MAGNESIUM: Magnesium: 2.6 mg/dL — ABNORMAL HIGH (ref 1.7–2.4)

## 2020-08-13 LAB — PHOSPHORUS: Phosphorus: 2.9 mg/dL (ref 2.5–4.6)

## 2020-08-13 LAB — TRIGLYCERIDES: Triglycerides: 194 mg/dL — ABNORMAL HIGH (ref <150)

## 2020-08-13 LAB — PROCALCITONIN: Procalcitonin: 3.05 ng/mL

## 2020-08-13 MED ORDER — DEXMEDETOMIDINE HCL IN NACL 200 MCG/50ML IV SOLN
0.4000 ug/kg/h | INTRAVENOUS | Status: DC
  Administered 2020-08-13: 0.4 ug/kg/h via INTRAVENOUS
  Administered 2020-08-13: 1.2 ug/kg/h via INTRAVENOUS
  Administered 2020-08-13: 1 ug/kg/h via INTRAVENOUS
  Administered 2020-08-13: 0.5 ug/kg/h via INTRAVENOUS
  Administered 2020-08-13 (×2): 1.1 ug/kg/h via INTRAVENOUS
  Administered 2020-08-13: 1.2 ug/kg/h via INTRAVENOUS
  Administered 2020-08-14: 0.9 ug/kg/h via INTRAVENOUS
  Administered 2020-08-14: 1.2 ug/kg/h via INTRAVENOUS
  Administered 2020-08-14: 0.4 ug/kg/h via INTRAVENOUS
  Administered 2020-08-14: 0.7 ug/kg/h via INTRAVENOUS
  Administered 2020-08-14: 1 ug/kg/h via INTRAVENOUS
  Administered 2020-08-14: 0.6 ug/kg/h via INTRAVENOUS
  Administered 2020-08-14: 1.2 ug/kg/h via INTRAVENOUS
  Administered 2020-08-15 (×3): 0.7 ug/kg/h via INTRAVENOUS
  Filled 2020-08-13: qty 100
  Filled 2020-08-13 (×8): qty 50
  Filled 2020-08-13: qty 100
  Filled 2020-08-13 (×6): qty 50

## 2020-08-13 MED ORDER — SODIUM CHLORIDE 0.9 % IV SOLN
1.0000 g | Freq: Two times a day (BID) | INTRAVENOUS | Status: DC
  Filled 2020-08-13: qty 1

## 2020-08-13 NOTE — Consult Note (Signed)
NAME:  [name], MRN:  [id number], DOB:  22-Nov-1945, LOS: 3 ADMISSION DATE:  08/10/2020, CONSULTATION DATE:  1/11 REFERRING MD:  EDP/ [facility] CHIEF COMPLAINT:  resp failure/intubated    Brief History:   ED 1/10 pm  with sob.  Pt was admitted from 1/3 to 1/6 for Covid pneumonia - 1st symptoms around 07/25/20 .  Pt also had a CHF exacerbation while there and had an ECHO which showed an EF of 20-25%.  She was seen by cardiology in the hospital.  Plan for cardiac cath vs coronary artery CT as an outpatient.  Pt developed a rash to decadron.  Pt received 4 days of remdesivir in the hospital.  She was d/c on room air.   Pm 1/10  pt developed worsening sob.  The pt is unable to g  hx due to extreme sob. She had O2 sats of 72% on RA.  She was placed on a 100% NRB and sats only went up to 88% > intubated at [facility] ER  and plan transfer to [facility]  Past Medical History:  Arthritis, Chronic back pain,  Diabetes mellitus GERD  HTN Colonic polyps Mitral valve prolapse   has a past medical history of Arthritis, Chronic back pain, Diabetes mellitus without complication ([place]), GERD (gastroesophageal reflux disease), History of bronchitis (> 8 yrs ago), History of shingles, HTN (hypertension), colonic polyps, Internal and external hemorrhoids without complication, Joint pain, Mitral valve prolapse, Nocturia, PONV (postoperative nausea and vomiting), Restless leg syndrome, Seasonal allergies, Urinary frequency, and Uterine fibroid.   has a past surgical history that includes Hysteroscopy with D & C (08/23/2000); Lumbar disc surgery (03/16/2005); Plantar fascia release (Left, 02/03/2010); Hemorrhoid surgery; Tonsillectomy; Wrist surgery; Tubal ligation; Colonoscopy (07/12/2004); Bunionectomy (Bilateral); Colonoscopy; Lumbar epidural injection; Finger arthroscopy with carpometacarpel(cmc) arthroplasty (Right, 09/28/2015); and Tendon transfer (Right, 09/28/2015).   There is no immunization history on file for this  patient.   Significant Hospital Events:  1/4 - ECHO - ef 25% xxxxxxx   1/10 [place] Na - intubated in ER pm 1/10 with immediate pink frothy secretions   1/11 - arrived at [facility] ICU bed 1225. Nurse reports patient sync with vent 60% fio2 /peep 5. but febrile - 103F. On diprivan and levophed.   1/12 -  duplex LE - neg for DVT. On 40% fio2, peep 5. On some levophed, fent gtt, diprivan gtt. cxr some better. Trop fine. TGL 350s. 103F   Consults:  PCCM  Procedures:  Oral ET 1/10 >>>  Significant Diagnostic Tests:    Micro Data:    1/11 -trach aspirate - neg so far 1/11 - blood culture - neg so far 1/11 - MRSA PCR - neg 1/11 - covid IgG - NEG 1/12 - urine strep neg   Antimicrobials:    1/11 Vanc - 1/12 1/11 Cefepime  >>    Interim History / Subjective:    08/13/2020- 40% fio2 on vent. Still febrile 103F. Culture negative. PCT improving. On cefepime,. AKI - creat better 1.54mg %. CXR with bilateral effusions. Even balance I/0. Failed SBT - CK - 1500, On diprivan gtt, Off levophed since 9am   Results for [name], [name] (MRN [id number]) as of 08/13/2020 13:46  Ref. Range 08/11/2020 19:07  CK Total Latest Ref Range: 38 - 234 U/L 1,519 (H)    Objective   Blood pressure (!) 218/77, pulse (!) 110, temperature (!) 102.2 F (39 C), resp. rate (!) 24, height 5\' 5"  (1.651 m), weight 92 kg, SpO2 94 %.  Vent Mode: PSV;CPAP FiO2 (%):  [30 %-40 %] 40 % Set Rate:  [24 bmp-25 bmp] 24 bmp Vt Set:  [450 mL] 450 mL PEEP:  [5 cmH20] 5 cmH20 Pressure Support:  [8 cmH20] 8 cmH20 Plateau Pressure:  [16 cmH20-17 cmH20] 17 cmH20   Intake/Output Summary (Last 24 hours) at 08/13/2020 1343 Last data filed at 08/13/2020 1235 Gross per 24 hour  Intake 1284.12 ml  Output 910 ml  Net 374.12 ml   Filed Weights   08/10/20 2213  Weight: 92 kg     General Appearance:  Looks criticall ill. Looks deconditioned Head:  Normocephalic, without obvious abnormality, atraumatic Eyes:   PERRL - yes, conjunctiva/corneas - muddy     Ears:  Normal external ear canals, both ears Nose:  G tube - no Throat:  ETT TUBE - yes , OG tube - yes Neck:  Supple,  No enlargement/tenderness/nodules Lungs: Clear to auscultation bilaterally, Ventilator   Synchrony - failed sbt Heart:  S1 and S2 normal, no murmur, CVP - no.  Pressors - levophed Abdomen:  Soft, no masses, no organomegaly Genitalia / Rectal:  Not done Extremities:  Extremities- intact Skin:  ntact in exposed areas . Sacral area - not examined Neurologic:  Sedation - fent gtt, precedex gtt -> RASS - -3        Resolved Hospital Problem list     Assessment & Plan:   ASSESSMENT / PLAN:  PULMONARY  A:  Acute resp failure due to  Hypoxemia and hypercarbia at re=admission 08/10/2020   - covid admission 1/3 to 1/6 for Covid pneumonia - 1st symptoms around 07/25/20 .  - acute systolic CHF new dx [id number] during first admission   08/13/2020 - > does not meet criteria for Extubation in setting of Acute Respiratory Failure due to flash pulm edema v HCAP    P:   Full vent support Vent bundle   NEUROLOGIC A:   Sedation needs on ventilator. Noticed to be fololowing command 08/10/20  08/13/2020 - RASS -3 on diprivan but has CK 1500 and concern for  rhabdo  P:   Change diprivan to precedex Fent gtt v prn RASS goal 0 to -3 with vent synchrony    VASCULAR A:   Circulatory shock upon arrival at [place] + CHF mediated likely? ? Sepsis (febrile). Cortisol 13.7 on 08/13/20  08/13/2020 - off levophed  P:   MAP > 65 Lasix x 1 Hold off fluids   CARDIAC STRUCTURAL A: New onset acute systolic CHF [id number]. Needs opd cath per cards   08/13/2020 -currently flash pulm edema v HCAP. Trop negatie  P: Lasix x 1 1/12 and repeat 1/13 repeah echo  CARDIAC ELECTRICAL A: Sinus on arriva at [facility] . 20 Bbeat runs of NS Vtach v SVT 17.25 08/11/20  08/13/2020  - no further arrhtmmia reports other than PVC esp d  uring SBT  P: Optimize electrolytes  Tele monitoring  INFECTIOUS A:   COVID-19 -symptom 12/25. Admitted 1/3 with positive covid. REadmitted 08/11/20. Covid IgG  - negative  REadmitted 1/11 - Febrile 103F + high wbc - SIRS +. Pulmoanry infiltrates  + high PCT  - features are c/w SEptic shock  - likely source HCAP    08/13/2020 - still febrile.,  MRSA PCR negatie. All features c/w septic shock. Covid IgG negative suggesting poor prognsis biut culture negative so far ? rhabdo from CK. ? Drug fever. U   P:   Await Pan culture empriic abx -> change  cefepime to imipenem rec Check covid IgG around 08/20/20 Await urine leg  RENAL A:  Baseline creaat  1.1mg % - 1.13mg  2019 andearly Jan  2022  AKI - 1.35mg % onset 08/04/20 -> worse 08/10/20 to 1.58mg % and similar 08/12/20  P:  Optimize BP/HR Give lasix  Again 08/14/19 Await bmet 08/13/20 Avoid nephrotoxins  ELECTROLYTES A:  At risk for electrolyte imbalance  08/13/2020 - K < 4 and mag < 2  P: Replete K Replete mag  GASTROINTESTINAL A:   NPO  P:   TF PPI  HEMATOLOGIC   - HEME A:  At risk anemia of critical illness   P:  - PRBC for hgb </= 6.9gm%    - exceptions are   -  if ACS susepcted/confirmed then transfuse for hgb </= 8.0gm%,  or    -  active bleeding with hemodynamic instability, then transfuse regardless of hemoglobin value   At at all times try to transfuse 1 unit prbc as possible with exception of active hemorrhage   HEMATOLOGIC - Platelets A Normal platelets.  High risk for DVT/PE and also Lovenox complications of HITT. Duplex LE negative dVT 08/11/20 HITT Panel neg 08/11/20  P Lovenox for DVT prophylaxis  ENDOCRINE A:   DM   P:   ssi  MSK/DERM At risk for bedsores  Plan  - ROM     Best practice (evaluated daily)  Diet: npo Pain/Anxiety/Delirium protocol (if indicated): diprivan VAP protocol (if indicated):  DVT prophylaxis: Hep sq -> Lovenox GI prophylaxis: PPI Glucose control:  SSI Mobility: SBR Disposition: [facility] ICU 1225  Updates: husband updated 08/11/20 and again 08/12/20 and 08/13/20   Goals of Care:  Last date of multidisciplinary goals of care discussion: Family and staff present:  Summary of discussion:  Follow up goals of care discussion due:  Code Status: full code for now      [place]   The patient KIYARA BORON is critically ill with multiple organ systems failure and requires high complexity decision making for assessment and support, frequent evaluation and titration of therapies, application of advanced monitoring technologies and extensive interpretation of multiple databases.   Critical Care Time devoted to patient care services described in this note is  40  Minutes. This time reflects time of care of this signee Dr Brand Males. This critical care time does not reflect procedure time, or teaching time or supervisory time of PA/NP/Med student/Med Resident etc but could involve care discussion time     Dr. Brand Males, M.D., [facility].C.P Pulmonary and Critical Care Medicine Staff Physician [facility] Pager: [phone], If no answer or between  15:00h - 7:00h: call [phone]  08/13/2020 1:43 PM     LABS    PULMONARY Recent Labs  Lab 08/10/20 2231 08/11/20 0200 08/11/20 1753  PHART 7.084* 7.382 7.425  PCO2ART 79.6* 39.8 38.2  PO2ART 148* 133* 114*  HCO3 17.2* 23.4 24.6  O2SAT 96.6 98.8 97.9    CBC Recent Labs  Lab 08/10/20 2219 08/11/20 1907 08/12/20 0032  HGB 14.2 12.7 12.5  HCT 46.2* 39.7 37.8  WBC 16.6* 16.7* 15.7*  PLT 386 264 257    COAGULATION No results for input(s): INR in the last 168 hours.  CARDIAC  No results for input(s): TROPONINI in the last 168 hours. No results for input(s): PROBNP in the last 168 hours.   CHEMISTRY Recent Labs  Lab 08/10/20 2219 08/11/20 1907 08/12/20 0032  08/13/20 0331  NA 134* 138 136   --   K 3.7 3.7 3.3*  --   CL 99 103 101  --   CO2 22 21* 20*  --   GLUCOSE 365* 246* 215*  --   BUN 24* 31* 30*  --   CREATININE 1.58* 1.64* 1.54*  --   CALCIUM 8.6* 8.3* 8.1*  --   MG  --  1.6* 1.7  --   PHOS  --  3.8 3.2 2.9   Estimated Creatinine Clearance: 35.9 mL/min (A) (by C-G formula based on SCr of 1.54 mg/dL (H)).   LIVER Recent Labs  Lab 08/11/20 1907 08/12/20 0032  AST 57* 66*  ALT 62* 60*  ALKPHOS 128* 124  BILITOT 0.7 0.7  PROT 5.7* 5.7*  ALBUMIN 2.9* 2.9*     INFECTIOUS Recent Labs  Lab 08/11/20 1907 08/12/20 0032 08/13/20 0331  LATICACIDVEN 1.8 1.3  --   PROCALCITON 4.11 3.77 3.05     ENDOCRINE CBG (last 3)  Recent Labs    08/13/20 0755 08/13/20 1150 08/13/20 1333  GLUCAP 255* 218* 216*         IMAGING x48h  - image(s) personally visualized  -   highlighted in bold DG Abd 1 View  Result Date: 08/11/2020 CLINICAL DATA:  NG tube placement EXAM: ABDOMEN - 1 VIEW COMPARISON:  None. FINDINGS: NG tube is in the distal stomach. IMPRESSION: NG tube in the stomach. Electronically Signed   By: Rolm Baptise M.D.   On: 08/11/2020 19:24   DG CHEST PORT 1 VIEW  Result Date: 08/12/2020 CLINICAL DATA:  75 year old female COVID-78.  Intubated. EXAM: PORTABLE CHEST 1 VIEW COMPARISON:  Portable chest 08/10/2020 and earlier. FINDINGS: Portable AP semi upright view at 0501 hours. Endotracheal tube tip appears stable terminating above the carina. Internal and external portions of the enteric tube are visible, internal configuration appears stable with tip not included. More rotated to the left. Mediastinal contours remain normal. Coarse and confluent bilateral upper lung opacity has regressed, although bilateral lower lung veiling opacity has increased. No pneumothorax or chest wall gas. Paucity of bowel gas in the upper abdomen. No acute osseous abnormality identified. IMPRESSION: 1.  Stable lines and tubes. 2. Bilateral upper lung pneumonia appears regressed  since 08/10/2020, although increased bilateral pleural effusions are suspected. Electronically Signed   By: Genevie Ann M.D.   On: 08/12/2020 05:39   VAS [place] LOWER EXTREMITY VENOUS (DVT)  Result Date: 08/12/2020  Lower Venous DVT Study Indications: Edema.  Risk Factors: COVID 19 positive. Limitations: Poor ultrasound/tissue interface. Comparison Study: No prior studies. Performing Technologist: Oliver Hum RVT  Examination Guidelines: A complete evaluation includes B-mode imaging, spectral Doppler, color Doppler, and power Doppler as needed of all accessible portions of each vessel. Bilateral testing is considered an integral part of a complete examination. Limited examinations for reoccurring indications may be performed as noted. The reflux portion of the exam is performed with the patient in reverse Trendelenburg.  +---------+---------------+---------+-----------+----------+--------------+ RIGHT    CompressibilityPhasicitySpontaneityPropertiesThrombus Aging +---------+---------------+---------+-----------+----------+--------------+ CFV      Full           Yes      Yes                                 +---------+---------------+---------+-----------+----------+--------------+ SFJ      Full                                                        +---------+---------------+---------+-----------+----------+--------------+  FV Prox  Full                                                        +---------+---------------+---------+-----------+----------+--------------+ FV Mid   Full                                                        +---------+---------------+---------+-----------+----------+--------------+ FV DistalFull                                                        +---------+---------------+---------+-----------+----------+--------------+ PFV      Full                                                         +---------+---------------+---------+-----------+----------+--------------+ POP      Full           Yes      Yes                                 +---------+---------------+---------+-----------+----------+--------------+ PTV      Full                                                        +---------+---------------+---------+-----------+----------+--------------+ PERO     Full                                                        +---------+---------------+---------+-----------+----------+--------------+   +---------+---------------+---------+-----------+----------+--------------+ LEFT     CompressibilityPhasicitySpontaneityPropertiesThrombus Aging +---------+---------------+---------+-----------+----------+--------------+ CFV      Full           Yes      Yes                                 +---------+---------------+---------+-----------+----------+--------------+ SFJ      Full                                                        +---------+---------------+---------+-----------+----------+--------------+ FV Prox  Full                                                        +---------+---------------+---------+-----------+----------+--------------+  FV Mid   Full                                                        +---------+---------------+---------+-----------+----------+--------------+ FV DistalFull                                                        +---------+---------------+---------+-----------+----------+--------------+ PFV      Full                                                        +---------+---------------+---------+-----------+----------+--------------+ POP      Full           Yes      Yes                                 +---------+---------------+---------+-----------+----------+--------------+ PTV      Full                                                         +---------+---------------+---------+-----------+----------+--------------+ PERO     Full                                                        +---------+---------------+---------+-----------+----------+--------------+     Summary: RIGHT: - There is no evidence of deep vein thrombosis in the lower extremity.  - No cystic structure found in the popliteal fossa.  LEFT: - There is no evidence of deep vein thrombosis in the lower extremity.  - No cystic structure found in the popliteal fossa.  *See table(s) above for measurements and observations. Electronically signed by Curt Jews MD on 08/12/2020 at 2:42:15 PM.    Final

## 2020-08-13 NOTE — Progress Notes (Signed)
Pharmacy Antibiotic Note  [name] is a 75 y.o. female admitted on 08/10/2020 with pneumonia.  Pharmacy has been consulted for Meropenem dosing.  Escalating coverage d/t ongoing fever.  Plan: Meropenem 1g IV q12h Follow up renal function, culture results, and clinical course. Follow up narrowing antibiotic coverage when able.   Height: 5\' 5"  (165.1 cm) Weight: 92 kg (202 lb 13.2 oz) IBW/kg (Calculated) : 57  Temp (24hrs), Avg:100.4 F (38 C), Min:98.42 F (36.9 C), Max:102.56 F (39.2 C)  Recent Labs  Lab 08/10/20 2219 08/11/20 1907 08/12/20 0032  WBC 16.6* 16.7* 15.7*  CREATININE 1.58* 1.64* 1.54*  LATICACIDVEN  --  1.8 1.3    Estimated Creatinine Clearance: 35.9 mL/min (A) (by C-G formula based on SCr of 1.54 mg/dL (H)).    Allergies  Allergen Reactions  . Amlodipine Swelling  . Depo-Medrol [Methylprednisolone] Hives  . Gabapentin Rash    hives   . Levothyroxine Other (See Comments)    severe muscle pain in neck    . Simvastatin Rash  . Sitagliptin Other (See Comments)    Urinary hesitancy  . Ace Inhibitors     cough  . Betadine [Povidone Iodine]     rash  . Codeine Nausea And Vomiting  . Cortizone-10 [Hydrocortisone] Other (See Comments)    unknown  . Tetracyclines & Related Nausea And Vomiting  . Dexamethasone Rash    Antimicrobials this admission: 1/11 Cefepime >> 1/13 1/11 Vancomycin >> 1/11 1/13 Meropenem >>   Dose adjustments this admission:  Microbiology results: 1/11 MRSA PCR: sent  Thank you for allowing pharmacy to be a part of this patient's care.  Gretta Arab PharmD, BCPS Clinical Pharmacist [facility] main pharmacy [phone] 08/13/2020 2:41 PM

## 2020-08-14 ENCOUNTER — Inpatient Hospital Stay (HOSPITAL_COMMUNITY): Payer: Medicare Other

## 2020-08-14 DIAGNOSIS — R0603 Acute respiratory distress: Secondary | ICD-10-CM | POA: Diagnosis not present

## 2020-08-14 DIAGNOSIS — L8990 Pressure ulcer of unspecified site, unspecified stage: Secondary | ICD-10-CM | POA: Insufficient documentation

## 2020-08-14 DIAGNOSIS — J9602 Acute respiratory failure with hypercapnia: Secondary | ICD-10-CM | POA: Diagnosis not present

## 2020-08-14 DIAGNOSIS — J9601 Acute respiratory failure with hypoxia: Secondary | ICD-10-CM | POA: Diagnosis not present

## 2020-08-14 LAB — BLOOD GAS, ARTERIAL
Acid-base deficit: 2.2 mmol/L — ABNORMAL HIGH (ref 0.0–2.0)
Bicarbonate: 21.6 mmol/L (ref 20.0–28.0)
MECHVT: 100 mL
O2 Saturation: 89.2 %
Patient temperature: 37
pCO2 arterial: 35.8 mmHg (ref 32.0–48.0)
pH, Arterial: 7.397 (ref 7.350–7.450)
pO2, Arterial: 60 mmHg — ABNORMAL LOW (ref 83.0–108.0)

## 2020-08-14 LAB — CBC
HCT: 34.1 % — ABNORMAL LOW (ref 36.0–46.0)
Hemoglobin: 11.1 g/dL — ABNORMAL LOW (ref 12.0–15.0)
MCH: 29.7 pg (ref 26.0–34.0)
MCHC: 32.6 g/dL (ref 30.0–36.0)
MCV: 91.2 fL (ref 80.0–100.0)
Platelets: 165 10*3/uL (ref 150–400)
RBC: 3.74 MIL/uL — ABNORMAL LOW (ref 3.87–5.11)
RDW: 14.7 % (ref 11.5–15.5)
WBC: 4.6 10*3/uL (ref 4.0–10.5)
nRBC: 0 % (ref 0.0–0.2)

## 2020-08-14 LAB — GLUCOSE, CAPILLARY
Glucose-Capillary: 287 mg/dL — ABNORMAL HIGH (ref 70–99)
Glucose-Capillary: 181 mg/dL — ABNORMAL HIGH (ref 70–99)
Glucose-Capillary: 164 mg/dL — ABNORMAL HIGH (ref 70–99)
Glucose-Capillary: 155 mg/dL — ABNORMAL HIGH (ref 70–99)
Glucose-Capillary: 186 mg/dL — ABNORMAL HIGH (ref 70–99)
Glucose-Capillary: 176 mg/dL — ABNORMAL HIGH (ref 70–99)
Glucose-Capillary: 129 mg/dL — ABNORMAL HIGH (ref 70–99)
Glucose-Capillary: 146 mg/dL — ABNORMAL HIGH (ref 70–99)

## 2020-08-14 LAB — COMPREHENSIVE METABOLIC PANEL
ALT: 46 U/L — ABNORMAL HIGH (ref 0–44)
AST: 61 U/L — ABNORMAL HIGH (ref 15–41)
Albumin: 2.7 g/dL — ABNORMAL LOW (ref 3.5–5.0)
Alkaline Phosphatase: 114 U/L (ref 38–126)
Anion gap: 11 (ref 5–15)
BUN: 45 mg/dL — ABNORMAL HIGH (ref 8–23)
CO2: 19 mmol/L — ABNORMAL LOW (ref 22–32)
Calcium: 8.3 mg/dL — ABNORMAL LOW (ref 8.9–10.3)
Chloride: 109 mmol/L (ref 98–111)
Creatinine, Ser: 1.34 mg/dL — ABNORMAL HIGH (ref 0.44–1.00)
GFR, Estimated: 42 mL/min — ABNORMAL LOW (ref >60)
Glucose, Bld: 339 mg/dL — ABNORMAL HIGH (ref 70–99)
Potassium: 3.5 mmol/L (ref 3.5–5.1)
Sodium: 139 mmol/L (ref 135–145)
Total Bilirubin: 0.6 mg/dL (ref 0.3–1.2)
Total Protein: 5.7 g/dL — ABNORMAL LOW (ref 6.5–8.1)

## 2020-08-14 LAB — CULTURE, RESPIRATORY W GRAM STAIN: Culture: NORMAL

## 2020-08-14 LAB — PHOSPHORUS: Phosphorus: 2.9 mg/dL (ref 2.5–4.6)

## 2020-08-14 LAB — ECHOCARDIOGRAM LIMITED
Area-P 1/2: 4.57 cm2
Height: 65 in
Weight: 3287.5 oz

## 2020-08-14 MED ORDER — POTASSIUM CHLORIDE 20 MEQ PO PACK: 40.0000 meq | PACK | Freq: Two times a day (BID) | ORAL | Status: DC

## 2020-08-14 MED ORDER — POTASSIUM CHLORIDE 20 MEQ PO PACK: 40.0000 meq | PACK | Freq: Every day | ORAL | Status: DC

## 2020-08-14 MED ORDER — METOPROLOL TARTRATE 5 MG/5ML IV SOLN: 5.0000 mg | Freq: Four times a day (QID) | INTRAVENOUS | Status: DC

## 2020-08-14 MED ORDER — SODIUM CHLORIDE 0.9 % IV SOLN
1.0000 g | Freq: Two times a day (BID) | INTRAVENOUS | Status: DC
  Administered 2020-08-14 – 2020-08-17 (×7): 1 g via INTRAVENOUS
  Filled 2020-08-14 (×7): qty 1

## 2020-08-14 MED ORDER — ORAL CARE MOUTH RINSE
15.0000 mL | Freq: Two times a day (BID) | OROMUCOSAL | Status: DC
  Administered 2020-08-14 – 2020-08-20 (×13): 15 mL via OROMUCOSAL

## 2020-08-14 MED ORDER — POTASSIUM CHLORIDE 10 MEQ/100ML IV SOLN
10.0000 meq | INTRAVENOUS | Status: AC
  Administered 2020-08-14 (×4): 10 meq via INTRAVENOUS
  Filled 2020-08-14 (×5): qty 100

## 2020-08-14 MED ORDER — POLYETHYLENE GLYCOL 3350 17 G PO PACK: 17.0000 g | PACK | Freq: Every day | ORAL | Status: DC

## 2020-08-14 MED ORDER — ACETAMINOPHEN 325 MG PO TABS
650.0000 mg | ORAL_TABLET | Freq: Four times a day (QID) | ORAL | Status: DC | PRN
  Administered 2020-08-21: 650 mg via ORAL
  Filled 2020-08-14: qty 2

## 2020-08-14 MED ORDER — ACETAMINOPHEN 650 MG RE SUPP
650.0000 mg | RECTAL | Status: DC | PRN
  Filled 2020-08-14: qty 1

## 2020-08-14 MED ORDER — HALOPERIDOL LACTATE 5 MG/ML IJ SOLN
5.0000 mg | Freq: Once | INTRAMUSCULAR | Status: AC
  Administered 2020-08-14: 5 mg via INTRAVENOUS
  Filled 2020-08-14: qty 1

## 2020-08-14 MED ORDER — FUROSEMIDE 10 MG/ML IJ SOLN
40.0000 mg | Freq: Two times a day (BID) | INTRAMUSCULAR | Status: DC
  Administered 2020-08-14 – 2020-08-19 (×9): 40 mg via INTRAVENOUS
  Filled 2020-08-14 (×11): qty 4

## 2020-08-14 MED ORDER — DOCUSATE SODIUM 100 MG PO CAPS: 100.0000 mg | ORAL_CAPSULE | Freq: Two times a day (BID) | ORAL | Status: DC

## 2020-08-14 NOTE — Progress Notes (Signed)
  Echocardiogram 2D Echocardiogram has been performed.  [name] 08/14/2020, 10:34 AM

## 2020-08-14 NOTE — Progress Notes (Signed)
NAME:  [name], MRN:  [id number], DOB:  06-Oct-1945, LOS: 4 ADMISSION DATE:  08/10/2020, CONSULTATION DATE:  1/11 REFERRING MD:  EDP/ [facility] CHIEF COMPLAINT:  resp failure/intubated    Brief History:   ED 1/10 pm  with sob.  Pt was admitted from 1/3 to 1/6 for Covid pneumonia - 1st symptoms around 07/25/20 .  Pt also had a CHF exacerbation while there and had an ECHO which showed an EF of 20-25%.  She was seen by cardiology in the hospital.  Plan for cardiac cath vs coronary artery CT as an outpatient.  Pt developed a rash to decadron.  Pt received 4 days of remdesivir in the hospital.  She was d/c on room air.   Pm 1/10  pt developed worsening sob.  The pt is unable to g  hx due to extreme sob. She had O2 sats of 72% on RA.  She was placed on a 100% NRB and sats only went up to 88% > intubated at [facility] ER  and plan transfer to [facility]  Past Medical History:  Arthritis, Chronic back pain,  Diabetes mellitus GERD  HTN Colonic polyps Mitral valve prolapse   has a past medical history of Arthritis, Chronic back pain, Diabetes mellitus without complication (Colon), GERD (gastroesophageal reflux disease), History of bronchitis (> 8 yrs ago), History of shingles, HTN (hypertension), colonic polyps, Internal and external hemorrhoids without complication, Joint pain, Mitral valve prolapse, Nocturia, PONV (postoperative nausea and vomiting), Restless leg syndrome, Seasonal allergies, Urinary frequency, and Uterine fibroid.   has a past surgical history that includes Hysteroscopy with D & C (08/23/2000); Lumbar disc surgery (03/16/2005); Plantar fascia release (Left, 02/03/2010); Hemorrhoid surgery; Tonsillectomy; Wrist surgery; Tubal ligation; Colonoscopy (07/12/2004); Bunionectomy (Bilateral); Colonoscopy; Lumbar epidural injection; Finger arthroscopy with carpometacarpel(cmc) arthroplasty (Right, 09/28/2015); and Tendon transfer (Right, 09/28/2015).   There is no immunization history on file for this  patient.   Significant Hospital Events:  1/4 - ECHO - ef 25% xxxxxxx   1/10 [name] - intubated in ER pm 1/10 with immediate pink frothy secretions   1/11 - arrived at [place] ICU bed 1225. Nurse reports patient sync with vent 60% fio2 /peep 5. but febrile - 103F. On diprivan and levophed.   1/12 -  duplex LE - neg for DVT. On 40% fio2, peep 5. On some levophed, fent gtt, diprivan gtt. cxr some better. Trop fine. TGL 350s. 103F  1/13- 40% fio2 on vent. Still febrile 103F. Culture negative. PCT improving. On cefepime,. AKI - creat better 1.54mg %. CXR with bilateral effusions. Even balance I/0. Failed SBT - CK - 1500, On diprivan gtt, Off levophed since 9am   Results for [name] (MRN [id number]) as of 08/13/2020 13:46  Ref. Range 08/11/2020 19:07  CK Total Latest Ref Range: 38 - 234 U/L 1,519 (H)    Consults:  PCCM  Procedures:  Oral ET 1/10 >>>1/14 self extubated on precedex BiPAP 1/14  Significant Diagnostic Tests:    Micro Data:    1/11 -trach aspirate - normal floar 1/11 - blood culture - neg so far 1/11 - MRSA PCR - neg 1/11 - covid IgG - NEG 1/12 - urine strep neg   Antimicrobials:   1/11 Vanc - 1/12 1/11 Cefepime  >>1/13 1/14 Merrem (due to persistent fever) >>    Interim History / Subjective:    08/14/2020- self extubated while on precedex. -> started BiPAP. Still febrile desspie culture neatie and cefepime stopped. Repeat ech o  today  Ef 25%. No clear cut vegetations. Mitral regurg - could be under-estimated. AKi better to creat 1.3mg %    Objective   Blood pressure (!) 164/47, pulse 88, temperature 99.6 F (37.6 C), temperature source Axillary, resp. rate (!) 28, height 5\' 5"  (1.651 m), weight 93.2 kg, SpO2 95 %.    Vent Mode: PCV;BIPAP FiO2 (%):  [40 %] 40 % Set Rate:  [24 bmp-25 bmp] 25 bmp Vt Set:  [450 mL] 450 mL PEEP:  [5 cmH20-6 cmH20] 6 cmH20 Plateau Pressure:  [17 cmH20-18 cmH20] 17 cmH20   Intake/Output Summary (Last 24  hours) at 08/14/2020 1238 Last data filed at 08/14/2020 1000 Gross per 24 hour  Intake 645.6 ml  Output 1000 ml  Net -354.4 ml   Filed Weights   08/10/20 2213 08/14/20 0500  Weight: 92 kg 93.2 kg   General Appearance:  Looks better Head:  Normocephalic, without obvious abnormality, atraumatic Eyes:  PERRL - yes, conjunctiva/corneas - muddy     Ears:  Normal external ear canals, both ears Nose:  G tube - no Throat:  ETT TUBE - no , OG tube - no. BiPAP ON Neck:  Supple,  No enlargement/tenderness/nodules Lungs: Clear to auscultation bilaterally, Ventilator   Synchrony - Zamora Heart:  S1 and S2 normal, no murmur, CVP - x.  Pressors - no Abdomen:  Soft, no masses, no organomegaly Genitalia / Rectal:  Not done Extremities:  Extremities- intact Skin:  ntact in exposed areas . Sacral area - not examined Neurologic:  Sedation - precedex -> RASS - -2 . Moves all 4s - yes. CAM-ICU - unable to asess . Orientation - moved legs to command and gave thumbs up    Resolved Hospital Problem list     Assessment & Plan:   ASSESSMENT / PLAN:  PULMONARY  A:  Acute resp failure due to  Hypoxemia and hypercarbia at re=admission 08/10/2020  -> self extubated 1/14  - covid admission 1/3 to 1/6 for Covid pneumonia - 1st symptoms around 07/25/20 .  - acute systolic CHF new dx 123XX123 during first admission   08/14/2020 - >self extubated to BIPAP and tolerating it well but clinically seems to need it on rounds   P:   BiPAP to continue QHS and day time 4h on with 2h off   NEUROLOGIC A:   Sedation needs on ventilator. Noticed to be fololowing command 08/10/20  - Diprivan gtt stopped 08/13/20 following rising CK   08/14/2020 - On precedex x 24 and seems to need it  P:   precedex RASS goal 0  Check CK 08/15/20 to ensure improvement   VASCULAR A:   Circulatory shock upon arrival at [place] + CHF mediated likely? ? Sepsis (febrile). Cortisol 13.7 on 08/13/20  08/14/2020 - off levophed x  24-48h  P:   MAP > 65 Hold off fluids   CARDIAC STRUCTURAL A: New onset acute systolic CHF 123XX123. Needs opd cath per cards   08/14/2020 - persistent low EF wit under-estimated mitral regug. Trop negative  P: Start scheduled IV lasix Tele Likely needs cards consult prior to dc - consideration of cath prior to discharge  CARDIAC ELECTRICAL A: Sinus on arriva at [facility] . 20 Bbeat runs of NS Vtach v SVT 17.25 08/11/20  08/14/2020  - no further arrhtmmia reports other than PVC esp d uring SBT  P: Optimize electrolytes  Tele monitoring  INFECTIOUS A:   COVID-19 -symptom 12/25. Admitted 1/3 with positive covid. REadmitted 08/11/20.  Covid IgG  - negative 08/10/2020  REadmitted 1/11 - Febrile 103F + high wbc - SIRS +. Pulmoanry infiltrates  + high PCT  - features are c/w SEptic shock  - likely source HCAP    08/14/2020 - still febrile despite stopping cephalosporin. Cutlurre negative. No vedgetation on 2D  P:   Empiric imipenem rec Check covid IgG around 08/20/20 If fever unresolved - TEE v ID Consult  RENAL A:  Baseline creaat  1.1mg % - 1.13mg  2019 andearly Jan  2022  08/14/2020 - improved with lasix x 2 days  P:  Optimize BP/HR Scheduled lasix Avoid nephrotoxins  ELECTROLYTES A:  At risk for electrolyte imbalance  08/14/2020 - K < 4    P: Replete K and monitor closely with lasix Replete mag  GASTROINTESTINAL A:   NPO  P:   TF PPI  HEMATOLOGIC   - HEME A:  anemia of critical illness - onset 08/14/20   P:  - PRBC for hgb </= 6.9gm%    - exceptions are   -  if ACS susepcted/confirmed then transfuse for hgb </= 8.0gm%,  or    -  active bleeding with hemodynamic instability, then transfuse regardless of hemoglobin value   At at all times try to transfuse 1 unit prbc as possible with exception of active hemorrhage   HEMATOLOGIC - Platelets A Normal platelets.  High risk for DVT/PE and also Lovenox complications of HITT. Duplex LE negative dVT  08/11/20 HITT Panel neg 08/11/20  P Lovenox for DVT prophylaxis  ENDOCRINE A:   DM   P:   ssi  MSK/DERM At risk for bedsores  Plan  - ROM     Best practice (evaluated daily)  Diet: npo Pain/Anxiety/Delirium protocol (if indicated): diprivan VAP protocol (if indicated):  DVT prophylaxis: Hep sq -> Lovenox GI prophylaxis: PPI Glucose control: SSI Mobility: SBR Disposition: [place] ICU 1225  Updates: husband updated 08/11/20 and again 08/12/20 and 08/13/20 and will be updated 08/14/20   Goals of Care:  Last date of multidisciplinary goals of care discussion: Family and staff present:  Summary of discussion:  Follow up goals of care discussion due:  Code Status: full code for now      [place]   The patient [name] is critically ill with multiple organ systems failure and requires high complexity decision making for assessment and support, frequent evaluation and titration of therapies, application of advanced monitoring technologies and extensive interpretation of multiple databases.   Critical Care Time devoted to patient care services described in this note is  40  Minutes. This time reflects time of care of this signee Dr Brand Males. This critical care time does not reflect procedure time, or teaching time or supervisory time of PA/NP/Med student/Med Resident etc but could involve care discussion time     Dr. Brand Males, M.D., [facility].C.P Pulmonary and Critical Care Medicine Staff Physician [place] [facility] Pager: [phone], If no answer or between  15:00h - 7:00h: call [phone]  08/14/2020 1:03 PM      LABS    PULMONARY Recent Labs  Lab 08/10/20 2231 08/11/20 0200 08/11/20 1753 08/14/20 0745  PHART 7.084* 7.382 7.425 7.397  PCO2ART 79.6* 39.8 38.2 35.8  PO2ART 148* 133* 114* 60.0*  HCO3 17.2* 23.4 24.6 21.6  O2SAT 96.6 98.8 97.9 89.2    CBC Recent Labs  Lab  08/11/20 1907 08/12/20 0032 08/14/20 0246  HGB 12.7  12.5 11.1*  HCT 39.7 37.8 34.1*  WBC 16.7* 15.7* 4.6  PLT 264 257 165    COAGULATION No results for input(s): INR in the last 168 hours.  CARDIAC  No results for input(s): TROPONINI in the last 168 hours. No results for input(s): PROBNP in the last 168 hours.   CHEMISTRY Recent Labs  Lab 08/10/20 2219 08/11/20 1907 08/12/20 0032 08/13/20 0331 08/13/20 1430 08/14/20 0246  Zamora 134* 138 136  --  140 139  K 3.7 3.7 3.3*  --  3.6 3.5  CL 99 103 101  --  106 109  CO2 22 21* 20*  --  22 19*  GLUCOSE 365* 246* 215*  --  235* 339*  BUN 24* 31* 30*  --  48* 45*  CREATININE 1.58* 1.64* 1.54*  --  1.78* 1.34*  CALCIUM 8.6* 8.3* 8.1*  --  8.3* 8.3*  MG  --  1.6* 1.7  --  2.6*  --   PHOS  --  3.8 3.2 2.9  --  2.9   Estimated Creatinine Clearance: 41.6 mL/min (A) (by C-G formula based on SCr of 1.34 mg/dL (H)).   LIVER Recent Labs  Lab 08/11/20 1907 08/12/20 0032 08/14/20 0246  AST 57* 66* 61*  ALT 62* 60* 46*  ALKPHOS 128* 124 114  BILITOT 0.7 0.7 0.6  PROT 5.7* 5.7* 5.7*  ALBUMIN 2.9* 2.9* 2.7*     INFECTIOUS Recent Labs  Lab 08/11/20 1907 08/12/20 0032 08/13/20 0331  LATICACIDVEN 1.8 1.3  --   PROCALCITON 4.11 3.77 3.05     ENDOCRINE CBG (last 3)  Recent Labs    08/14/20 0721 08/14/20 0809 08/14/20 1037  GLUCAP 181* 164* 155*         IMAGING x48h  - image(s) personally visualized  -   highlighted in bold DG CHEST PORT 1 VIEW  Result Date: 08/14/2020 CLINICAL DATA:  Extubated, COVID EXAM: PORTABLE CHEST 1 VIEW COMPARISON:  08/12/2020 FINDINGS: Interval removal of the endotracheal and transesophageal tube seen on comparison exam. Some persistently low lung volumes are present with gradient opacity towards the lung bases compatible with layering pleural effusions and likely atelectasis. No pneumothorax. Overall extent of opacity is quite similar to comparison exam accounting for differences in  positioning and technique. Grossly stable cardiomediastinal contours with a calcified aorta. No acute osseous or soft tissue abnormality. Telemetry leads overlie the chest. IMPRESSION: 1. Interval removal of the endotracheal and transesophageal tubes. 2. Persistently low lung volumes and atelectasis with layering bilateral effusions. Similar extent to prior. Electronically Signed   By: Lovena Le M.D.   On: 08/14/2020 06:41   ECHOCARDIOGRAM LIMITED  Result Date: 08/14/2020    ECHOCARDIOGRAM LIMITED REPORT   Patient Name:   [name] Date of Exam: 08/14/2020 Medical Rec #:  [id number]      Height:       65.0 in Accession #:    [id number]     Weight:       205.5 lb Date of Birth:  [birth year]      BSA:          2.001 m Patient Age:    8 years       BP:           138/54 mmHg Patient Gender: F              HR:           78 bpm. Exam Location:  Inpatient Procedure: Limited Echo, Cardiac  Doppler and Color Doppler Indications:    Acute respirtory distress  History:        Patient has prior history of Echocardiogram examinations, most                 recent 08/04/2020. Risk Factors:Diabetes and Hypertension. MVP.                 GERD. Covid-19.  Sonographer:    Clayton Lefort RDCS (AE) Referring Phys: Brand Males  Sonographer Comments: Suboptimal parasternal window. IMPRESSIONS  1. Left ventricular ejection fraction, by estimation, is 20 to 25%. The left ventricle has severely decreased function. The left ventricle has no regional wall motion abnormalities. Left ventricular diastolic parameters are indeterminate.  2. Right ventricular systolic function was not well visualized. The right ventricular size is not well visualized.  3. Left atrial size was moderately dilated.  4. The mitral valve is grossly normal. Mild mitral valve regurgitation. There is a second jet of mitral regurgitation seen in the four chamber view with eccentric pattern, mitral regurgitation may be underestimated in this study.  5. The aortic  valve is tricuspid. Aortic valve regurgitation is not visualized. No aortic stenosis is present.  6. The inferior vena cava is dilated in size with <50% respiratory variability, suggesting right atrial pressure of 15 mmHg. Comparison(s): A prior study was performed on 08/04/2020. No significant change from prior study. FINDINGS  Left Ventricle: No left ventricular thrombus visualized. Left ventricular ejection fraction, by estimation, is 20 to 25%. The left ventricle has severely decreased function. The left ventricle has no regional wall motion abnormalities. Left ventricular diastolic parameters are indeterminate. Right Ventricle: The right ventricular size is not well visualized. Right vetricular wall thickness was not assessed. Right ventricular systolic function was not well visualized. Left Atrium: Left atrial size was moderately dilated. Right Atrium: Right atrial size was normal in size. Mitral Valve: Two jets of mitral regurgitation. The mitral valve is grossly normal. Mild mitral valve regurgitation. MV peak gradient, 7.0 mmHg. The mean mitral valve gradient is 3.0 mmHg with average heart rate of 84 bpm. Tricuspid Valve: The tricuspid valve is grossly normal. Tricuspid valve regurgitation is trivial. Aortic Valve: The aortic valve is tricuspid. Aortic valve regurgitation is not visualized. No aortic stenosis is present. Pulmonic Valve: The pulmonic valve was not well visualized. Pulmonic valve regurgitation is not visualized. Aorta: The aortic root is normal in size and structure. Venous: The inferior vena cava is dilated in size with less than 50% respiratory variability, suggesting right atrial pressure of 15 mmHg. LEFT VENTRICLE PLAX 2D LVOT diam:     2.20 cm LVOT Area:     3.80 cm  IVC IVC diam: 2.38 cm  AORTA Ao Root diam: 2.90 cm MITRAL VALVE MV Area (PHT): 4.57 cm     SHUNTS MV Peak grad:  7.0 mmHg     Systemic Diam: 2.20 cm MV Mean grad:  3.0 mmHg MV Vmax:       1.32 m/s MV Vmean:      78.0 cm/s MV  Decel Time: 166 msec MV E velocity: 110.00 cm/s MV A velocity: 122.00 cm/s MV E/A ratio:  0.90 Rudean Haskell MD Electronically signed by Rudean Haskell MD Signature Date/Time: 08/14/2020/12:31:38 PM    Final

## 2020-08-14 NOTE — Progress Notes (Signed)
eLink Physician-Brief Progress Note Patient Name: SEHAJ KOLDEN DOB: 07/10/1946 MRN: [id number]   Date of Service  08/14/2020  HPI/Events of Note  Bedside RN requesting PRN Tylenol suppository order for temp of 101, patient had recent blood cultures.  eICU Interventions  PRN rectal Tylenol ordered.        Kerry Kass Myosha Cuadras 08/14/2020, 11:35 PM

## 2020-08-14 NOTE — Progress Notes (Addendum)
eLink Physician-Brief Progress Note Patient Name: [name] DOB: 1945/11/24 MRN: [id number]   Date of Service  08/14/2020  HPI/Events of Note  Notified of agitation.  Pt self-extubated earlier and becoming agitated and combative.  She is maintaining her sats at 96%.  QTc 437   eICU Interventions  Give haldol 5mg  IV. Get stat ABG.  Precedex restarted.     Intervention Category Intermediate Interventions: Change in mental status - evaluation and management  Elsie Lincoln 08/14/2020, 6:43 AM   7:07 AM She appears more calm and sedate.  O2 sat 93%, RR 28,  Plan> Still awaiting ABG.

## 2020-08-14 NOTE — Progress Notes (Signed)
eLink Physician-Brief Progress Note Patient Name: [name] DOB: [birth year] MRN: [id number]   Date of Service  08/14/2020  HPI/Events of Note  Pt self-extubated this morning.  She was on PEEP 5, 40% FiO2.   On camera assessment, pt was saturating 100% on NRB, RR 19.  All sedation has been turned off.   eICU Interventions  Pt is tolerating NRB and does not appear to be in distress. Continue to monitor on NRB.      Intervention Category Intermediate Interventions: Other:  Elsie Lincoln 08/14/2020, 4:34 AM

## 2020-08-14 NOTE — TOC Progression Note (Signed)
Transition of Care [facility]) - Progression Note    Patient Details  Name: [name] MRN: [id number] Date of Birth: 06/24/1946  [facility]) CM/SW Contact  Leeroy Cha, RN Phone Number: 08/14/2020, 8:55 AM  Clinical Narrative:    Extubated this am [id number] nrb mask at 15l/min, iv merrem. Plan: undetermined at this time Following for progression.   Expected Discharge Plan: Home/Self Care Barriers to Discharge: Continued Medical Work up  Expected Discharge Plan and Services Expected Discharge Plan: Home/Self Care   Discharge Planning Services: CM Consult   Living arrangements for the past 2 months: Single Family Home                                       Social Determinants of Health (SDOH) Interventions    Readmission Risk Interventions No flowsheet data found.

## 2020-08-14 NOTE — Progress Notes (Signed)
RT responded to VENT alarm, pt had pulled ET tube out.  Pt placed on NRB and tolerating well at this time .

## 2020-08-14 NOTE — Progress Notes (Signed)
Rt tried to placed pt on 6 LPM [place]. Pt had increase WOB with rate in the 30's. Rt placed pt back on BIPAP.

## 2020-08-14 NOTE — Progress Notes (Signed)
Pt. Self-extubated. Sedation has been cut off. This nurse and RT at bedside. Non-rebreathe applied at 15L. E-link notified with ordered to continue to monitor. Mild stridor heard along with rhonchi. Vitals are stable.

## 2020-08-14 NOTE — Progress Notes (Signed)
Assisted tele visit to patient with daughter.  Kristen Arizpe Anderson, RN   

## 2020-08-14 NOTE — Progress Notes (Signed)
NAME:  [name], MRN:  [id number], DOB:  [birth year], LOS: 4 ADMISSION DATE:  08/10/2020, CONSULTATION DATE: 1/11 REFERRING MD: [facility], CHIEF COMPLAINT:  Respiratory Failure    Brief History:  75 y/o F with recent admission for COVID 1/3-1/6 for COVID PNA.  She first had symptoms on Christmas. During that admission, she was also treated as a CHF exacerbation with an LVEF of 20-25%.  She was seen by Cardiology during that admission and recommended for cardiac cath vs coronary artery CT as an outpatient. She received 4 days of remdesivir, did not tolerated decadron due to rash.    On the evening of 1/10 she developed worsening shortness of breath and was found to have sats of 72% on RA.  She ultimately failed NRB and required intubation in the [facility] ER.  Post intubation, she had pinky frothy secretions.  She was transferred to [facility] on 1/14.   Past Medical History:  HTN  MVP  CHF - LVEF 20-25% DM II  GERD  Chronic Back Pain  RLS   Significant Hospital Events:  1/10 Admit, intubated for hypoxic respiratory failure, pink frothy secretions noted 1/11 Tx to [facility], febrile to 103, 60% FiO2 / PEEP 5  1/12 FiO2 40%, PEEP 5, on levophed, fent/diprovan, troponin flat, elevated triglycerides  1/13 Off levophed  1/14 Pt self extubated, to BiPAP  Consults:    Procedures:  ETT 1/10 >> 1/14   Significant Diagnostic Tests:   BLE Venous Duplex 1/12 >> negative for DVT   ECHO 1/14 >> LVEF 20-25%, LV has severely decreased function, no RWMA, RV systolic function not well visualized, LA moderately dilated, MV grossly normal, mild MVR. RA pressure of 62mmHg  Micro Data:  COVID 1/3 >> positive COVID 1gG 1/11 >> negative  MRSA PCR 1/11 >> negative  U. Strep 1/11 >> negative BCx2 1/11 >>  Tracheal aspirate 1/11 >> normal flora   Antimicrobials:  Cefepime 1/11 >> 1/13  Vanco 1/11 >>  Meropenem 1/14 >>   Interim History / Subjective:  Tmax 99.6  On BiPAP PCV Rate 10, PEEP 6, PC 6 Glucose range  164-181 I/O 356ml UOP in last 24 hours   Objective   Blood pressure (!) 164/47, pulse 88, temperature 99.6 F (37.6 C), temperature source Axillary, resp. rate (!) 28, height 5\' 5"  (1.651 m), weight 93.2 kg, SpO2 95 %.    Vent Mode: PCV;BIPAP FiO2 (%):  [40 %-70 %] 70 % Set Rate:  [10 bmp-25 bmp] 10 bmp Vt Set:  [450 mL] 450 mL PEEP:  [5 cmH20-6 cmH20] 6 cmH20 Plateau Pressure:  [17 cmH20-18 cmH20] 17 cmH20   Intake/Output Summary (Last 24 hours) at 08/14/2020 1346 Last data filed at 08/14/2020 1000 Gross per 24 hour  Intake 645.6 ml  Output 1000 ml  Net -354.4 ml   Filed Weights   08/10/20 2213 08/14/20 0500  Weight: 92 kg 93.2 kg    Examination: General: adult female lying in bed in NAD   HEENT: MM pink/moist, bipap mask in place, pupils 52mm / reactive, anicteric  Neuro: awakens to voice, nods / drifts back to sleep  CV: s1s2 RRR, no m/r/g PULM: non-labored on BiPAP, lungs bilaterally diminished  GI: soft, bsx4 active  Extremities: warm/dry, trace peripheral edema  Skin: no rashes or lesions  PCXR 1/14 >> images personally reviewed, low lung volumes, bilateral layering effusions  Resolved Hospital Problem list   Shock - suspect sedation related   Assessment & Plan:   Acute Hypoxic &  Hypercarbic Respiratory Failure in the setting of Pulmonary Edema / Acute Systolic CHF, +/- COVID pulmonary involvement.  Bilateral Pleural Effusions secondary to Systolic CHF  Self extubated 1/14. To BiPAP.  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 No baseline LVEF for comparison.  -tele monitoring -lasix 40 mg IV BID  -strict I/O's  -will need outpatient Cardiology follow up    SIRS  Fever  Febrile on admit, concern for  possible HCAP with elevated PCT, infiltrates, WBC -follow cultures -abx as above   Acute Metabolic Encephalopathy  -precedex in the setting of delirium  -minimize sedating medications as able    COVID  Initial positive 1/3, recent discharge 1/6.  -supportive care  AKI  -Trend BMP / urinary output -Replace electrolytes as indicated -Avoid nephrotoxic agents, ensure adequate renal perfusion  Mild Elevation LFT's -follow trend   Anemia  -trend CBC  -transfuse for Hgb <7%   DM II  Hgb A1c 6.9 08/11/20 -SSI, moderate SSI  -hold home glipizide, metformin   Hx HTN, HLD -hold home norvasc, coreg  Best practice (evaluated daily)  Diet: NPO Pain/Anxiety/Delirium protocol (if indicated): precedex  VAP protocol (if indicated): n/a  DVT prophylaxis: lovenox  GI prophylaxis: PPI  Glucose control: SSI  Mobility: BR, advance as tolerated  Disposition: ICU   Goals of Care:  Last date of multidisciplinary goals of care discussion: Family and staff present:  Summary of discussion:  Follow up goals of care discussion due:  Code Status: Full Code   Family:  Husband called for update 1/14. 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  Support offered.    Labs   CBC: Recent Labs  Lab 08/10/20 2219 08/11/20 1907 08/12/20 0032 08/14/20 0246  WBC 16.6* 16.7* 15.7* 4.6  NEUTROABS 9.7*  --   --   --   HGB 14.2 12.7 12.5 11.1*  HCT 46.2* 39.7 37.8 34.1*  MCV 95.7 91.3 89.8 91.2  PLT 386 264 257 258    Basic Metabolic Panel: Recent Labs  Lab 08/10/20 2219 08/11/20 1907 08/12/20 0032 08/13/20 0331 08/13/20 1430 08/14/20 0246  NA 134* 138 136  --  140 139  K 3.7 3.7 3.3*  --  3.6 3.5  CL 99 103 101  --  106 109  CO2 22 21* 20*  --  22 19*  GLUCOSE 365* 246* 215*  --  235* 339*  BUN 24* 31* 30*  --  48* 45*  CREATININE 1.58* 1.64* 1.54*  --  1.78* 1.34*  CALCIUM 8.6* 8.3* 8.1*  --  8.3* 8.3*  MG  --  1.6* 1.7  --  2.6*  --   PHOS   --  3.8 3.2 2.9  --  2.9   GFR: Estimated Creatinine Clearance: 41.6 mL/min (A) (by C-G formula based on SCr of 1.34 mg/dL (H)). Recent Labs  Lab 08/10/20 2219 08/11/20 1907 08/12/20 0032 08/13/20 0331 08/14/20 0246  PROCALCITON  --  4.11 3.77 3.05  --   WBC 16.6* 16.7* 15.7*  --  4.6  LATICACIDVEN  --  1.8 1.3  --   --     Liver Function Tests: Recent Labs  Lab 08/11/20 1907 08/12/20 0032 08/14/20 0246  AST 57* 66*  61*  ALT 62* 60* 46*  ALKPHOS 128* 124 114  BILITOT 0.7 0.7 0.6  PROT 5.7* 5.7* 5.7*  ALBUMIN 2.9* 2.9* 2.7*   No results for input(s): LIPASE, AMYLASE in the last 168 hours. No results for input(s): AMMONIA in the last 168 hours.  ABG    Component Value Date/Time   PHART 7.397 08/14/2020 0745   PCO2ART 35.8 08/14/2020 0745   PO2ART 60.0 (L) 08/14/2020 0745   HCO3 21.6 08/14/2020 0745   ACIDBASEDEF 2.2 (H) 08/14/2020 0745   O2SAT 89.2 08/14/2020 0745     Coagulation Profile: No results for input(s): INR, PROTIME in the last 168 hours.  Cardiac Enzymes: Recent Labs  Lab 08/11/20 1907  CKTOTAL 1,519*  CKMB 12.8*    HbA1C: Hgb A1c MFr Bld  Date/Time Value Ref Range Status  08/11/2020 07:07 PM 6.9 (H) 4.8 - 5.6 % Final    Comment:    (NOTE) Pre diabetes:          5.7%-6.4%  Diabetes:              >6.4%  Glycemic control for   <7.0% adults with diabetes   08/03/2020 01:57 PM 6.6 (H) 4.8 - 5.6 % Final    Comment:    (NOTE) Pre diabetes:          5.7%-6.4%  Diabetes:              >6.4%  Glycemic control for   <7.0% adults with diabetes     CBG: Recent Labs  Lab 08/13/20 2307 08/14/20 0335 08/14/20 0721 08/14/20 0809 08/14/20 1037  GLUCAP 266* 287* 181* 164* 155*    Critical care time: 34 minutes      Noe Gens, MSN, NP-C, AGACNP-BC [facility] 08/14/2020, 1:47 PM   Please see [email or web address] for pager details.

## 2020-08-15 DIAGNOSIS — J9601 Acute respiratory failure with hypoxia: Secondary | ICD-10-CM | POA: Diagnosis not present

## 2020-08-15 DIAGNOSIS — J189 Pneumonia, unspecified organism: Secondary | ICD-10-CM | POA: Diagnosis not present

## 2020-08-15 DIAGNOSIS — U071 COVID-19: Secondary | ICD-10-CM | POA: Diagnosis not present

## 2020-08-15 DIAGNOSIS — I5023 Acute on chronic systolic (congestive) heart failure: Secondary | ICD-10-CM

## 2020-08-15 LAB — COMPREHENSIVE METABOLIC PANEL
ALT: 50 U/L — ABNORMAL HIGH (ref 0–44)
AST: 68 U/L — ABNORMAL HIGH (ref 15–41)
Albumin: 2.6 g/dL — ABNORMAL LOW (ref 3.5–5.0)
Alkaline Phosphatase: 99 U/L (ref 38–126)
Anion gap: 15 (ref 5–15)
BUN: 40 mg/dL — ABNORMAL HIGH (ref 8–23)
CO2: 23 mmol/L (ref 22–32)
Calcium: 9.3 mg/dL (ref 8.9–10.3)
Chloride: 110 mmol/L (ref 98–111)
Creatinine, Ser: 1.27 mg/dL — ABNORMAL HIGH (ref 0.44–1.00)
GFR, Estimated: 44 mL/min — ABNORMAL LOW (ref >60)
Glucose, Bld: 180 mg/dL — ABNORMAL HIGH (ref 70–99)
Potassium: 3.4 mmol/L — ABNORMAL LOW (ref 3.5–5.1)
Sodium: 148 mmol/L — ABNORMAL HIGH (ref 135–145)
Total Bilirubin: 1.2 mg/dL (ref 0.3–1.2)
Total Protein: 6.5 g/dL (ref 6.5–8.1)

## 2020-08-15 LAB — CBC
HCT: 37.5 % (ref 36.0–46.0)
Hemoglobin: 12 g/dL (ref 12.0–15.0)
MCH: 29.5 pg (ref 26.0–34.0)
MCHC: 32 g/dL (ref 30.0–36.0)
MCV: 92.1 fL (ref 80.0–100.0)
Platelets: 190 10*3/uL (ref 150–400)
RBC: 4.07 MIL/uL (ref 3.87–5.11)
RDW: 14.6 % (ref 11.5–15.5)
WBC: 5.3 10*3/uL (ref 4.0–10.5)
nRBC: 0 % (ref 0.0–0.2)

## 2020-08-15 LAB — GLUCOSE, CAPILLARY
Glucose-Capillary: 166 mg/dL — ABNORMAL HIGH (ref 70–99)
Glucose-Capillary: 152 mg/dL — ABNORMAL HIGH (ref 70–99)
Glucose-Capillary: 155 mg/dL — ABNORMAL HIGH (ref 70–99)
Glucose-Capillary: 148 mg/dL — ABNORMAL HIGH (ref 70–99)

## 2020-08-15 LAB — CK TOTAL AND CKMB (NOT AT ARMC)
CK, MB: 1.8 ng/mL (ref 0.5–5.0)
Relative Index: 0.5 (ref 0.0–2.5)
Total CK: 359 U/L — ABNORMAL HIGH (ref 38–234)

## 2020-08-15 LAB — PHOSPHORUS: Phosphorus: 3.3 mg/dL (ref 2.5–4.6)

## 2020-08-15 LAB — MAGNESIUM: Magnesium: 1.8 mg/dL (ref 1.7–2.4)

## 2020-08-15 MED ORDER — POLYETHYLENE GLYCOL 3350 17 G PO PACK: 17.0000 g | PACK | Freq: Every day | ORAL | Status: DC | PRN

## 2020-08-15 MED ORDER — LIP MEDEX EX OINT
TOPICAL_OINTMENT | CUTANEOUS | Status: AC
  Filled 2020-08-15: qty 7

## 2020-08-15 MED ORDER — MAGNESIUM SULFATE 2 GM/50ML IV SOLN
2.0000 g | Freq: Once | INTRAVENOUS | Status: AC
  Administered 2020-08-15: 2 g via INTRAVENOUS
  Filled 2020-08-15: qty 50

## 2020-08-15 MED ORDER — DOCUSATE SODIUM 100 MG PO CAPS: 100.0000 mg | ORAL_CAPSULE | Freq: Every day | ORAL | Status: DC | PRN

## 2020-08-15 MED ORDER — LABETALOL HCL 5 MG/ML IV SOLN
10.0000 mg | INTRAVENOUS | Status: DC | PRN
  Administered 2020-08-15: 10 mg via INTRAVENOUS
  Filled 2020-08-15: qty 4

## 2020-08-15 MED ORDER — LIP MEDEX EX OINT: TOPICAL_OINTMENT | CUTANEOUS | Status: DC | PRN

## 2020-08-15 MED ORDER — POTASSIUM CHLORIDE 10 MEQ/100ML IV SOLN
10.0000 meq | INTRAVENOUS | Status: AC
  Administered 2020-08-15 (×4): 10 meq via INTRAVENOUS
  Filled 2020-08-15 (×4): qty 100

## 2020-08-15 NOTE — Plan of Care (Signed)
  Problem: Education: Goal: Knowledge of risk factors and measures for prevention of condition will improve Outcome: Progressing   Problem: Education: Goal: Knowledge of General Education information will improve Description: Including pain rating scale, medication(s)/side effects and non-pharmacologic comfort measures Outcome: Progressing   Problem: Health Behavior/Discharge Planning: Goal: Ability to manage health-related needs will improve Outcome: Progressing   Problem: Clinical Measurements: Goal: Ability to maintain clinical measurements within normal limits will improve Outcome: Progressing Goal: Will remain free from infection Outcome: Progressing Goal: Diagnostic test results will improve Outcome: Progressing Goal: Respiratory complications will improve Outcome: Progressing Goal: Cardiovascular complication will be avoided Outcome: Progressing   Problem: Activity: Goal: Risk for activity intolerance will decrease Outcome: Progressing   Problem: Nutrition: Goal: Adequate nutrition will be maintained Outcome: Progressing   Problem: Coping: Goal: Level of anxiety will decrease Outcome: Progressing   Problem: Elimination: Goal: Will not experience complications related to bowel motility Outcome: Progressing Goal: Will not experience complications related to urinary retention Outcome: Progressing   Problem: Pain Managment: Goal: General experience of comfort will improve Outcome: Progressing   Problem: Safety: Goal: Ability to remain free from injury will improve Outcome: Progressing   Problem: Skin Integrity: Goal: Risk for impaired skin integrity will decrease Outcome: Progressing   Problem: Education: Goal: Ability to demonstrate management of disease process will improve Outcome: Progressing Goal: Ability to verbalize understanding of medication therapies will improve Outcome: Progressing Goal: Individualized Educational Video(s) Outcome:  Progressing   Problem: Activity: Goal: Capacity to carry out activities will improve Outcome: Progressing   Problem: Cardiac: Goal: Ability to achieve and maintain adequate cardiopulmonary perfusion will improve Outcome: Progressing   Problem: Safety: Goal: Non-violent Restraint(s) Outcome: Progressing

## 2020-08-15 NOTE — Progress Notes (Signed)
Pharmacy: electrolyte replacement K 3.4 Mag 1.8 SCr 1.27, NPO, no PO access  Plan: K 4 runs  & Mg 2 gm per ICU protocol  Eudelia Bunch, Pharm.D 08/15/2020 10:59 AM

## 2020-08-15 NOTE — Progress Notes (Signed)
eLink Physician-Brief Progress Note Patient Name: [name] DOB: 1946/04/20 MRN: [id number]   Date of Service  08/15/2020  HPI/Events of Note  Pt has chronic lower back pain, takes Lyrica 100 mg BID at home not restarted. However Pt is NPO and doesn't have OG/NGT.  Extubated to HFNC from 13 th.   eICU Interventions  - keep NPO.no Lyrica till swallow eval assessment.  - Carmex - chapstick to lips ordered.      Intervention Category Intermediate Interventions: Pain - evaluation and management  Elmer Sow 08/15/2020, 10:39 PM

## 2020-08-15 NOTE — Progress Notes (Signed)
NAME:  [name], MRN:  [id number], DOB:  [birth year], LOS: 5 ADMISSION DATE:  08/10/2020, CONSULTATION DATE: 1/11 REFERRING MD: [facility], CHIEF COMPLAINT:  Respiratory Failure    Brief History:  75 y/o F with recent admission for COVID 1/3-1/6 for COVID PNA.  She first had symptoms on Christmas. During that admission, she was also treated as a CHF exacerbation with an LVEF of 20-25%.  She was seen by Cardiology during that admission and recommended for cardiac cath vs coronary artery CT as an outpatient. She received 4 days of remdesivir, did not tolerated decadron due to rash.    On the evening of 1/10 she developed worsening shortness of breath and was found to have sats of 72% on RA.  She ultimately failed NRB and required intubation in the [facility] ER.  Post intubation, she had pinky frothy secretions.  She was transferred to [facility] on 1/14.   Past Medical History:  HTN  MVP  CHF - LVEF 20-25% DM II  GERD  Chronic Back Pain  RLS   Significant Hospital Events:  1/10 Admit, intubated for hypoxic respiratory failure, pink frothy secretions noted 1/11 Tx to [facility], febrile to 103, 60% FiO2 / PEEP 5  1/12 FiO2 40%, PEEP 5, on levophed, fent/diprovan, troponin flat, elevated triglycerides  1/13 Off levophed  1/14 Pt self extubated, to BiPAP 1/15 transitioned to high flow oxygen  Consults:    Procedures:  ETT 1/10 >> 1/14   Significant Diagnostic Tests:   BLE Venous Duplex 1/12 >> negative for DVT   ECHO 1/14 >> LVEF 20-25%, LV has severely decreased function, no RWMA, RV systolic function not well visualized, LA moderately dilated, MV grossly normal, mild MVR. RA pressure of 61mmHg  Micro Data:  COVID 1/3 >> positive COVID 1gG 1/11 >> negative  MRSA PCR 1/11 >> negative  U. Strep 1/11 >> negative BCx2 1/11 >>  Tracheal aspirate 1/11 >> normal flora   Antimicrobials:  Cefepime 1/11 >> 1/13  Vancomycin 1/11 Meropenem 1/14 >>   Interim History / Subjective:  Transitioned to high  flow oxygen.  Tm 101.50F.  Denies chest or abdominal pain.  Objective   Blood pressure 119/62, pulse 82, temperature 99.5 F (37.5 C), temperature source Axillary, resp. rate 16, height 5\' 5"  (1.651 m), weight 87.8 kg, SpO2 96 %.    Vent Mode: BIPAP FiO2 (%):  [40 %] 40 % Set Rate:  [15 bmp] 15 bmp Vt Set:  [450 mL] 450 mL PEEP:  [6 cmH20] 6 cmH20   Intake/Output Summary (Last 24 hours) at 08/15/2020 0955 Last data filed at 08/15/2020 0900 Gross per 24 hour  Intake 1018.5 ml  Output 3625 ml  Net -2606.5 ml   Filed Weights   08/10/20 2213 08/14/20 0500 08/15/20 0500  Weight: 92 kg 93.2 kg 87.8 kg    Examination:  General - somnolent Eyes - pupils reactive ENT - no sinus tenderness, no stridor Cardiac - regular rate/rhythm, no murmur Chest - b/l rhonchi Abdomen - soft, non tender, + bowel sounds Extremities - no cyanosis, clubbing, or edema Skin - no rashes Neuro - moves extremities, follows commands   Resolved Hospital Problem list   Shock - suspect sedation related, AKI, Elevated LFTs  Assessment & Plan:   Acute hypoxic/hypercapnic respiratory failure from HCAP, acute pulmonary edema in setting of recent COVID 19 pneumonia. 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 Hx of HTN. - continue IV lasix - seen by cardiology 1/05 >> will need f/u when more stable - hold outpt norvasc, coreg, ASA  Acute metabolic encephalopathy from hypoxia. - monitor mental status -minimize sedating medications as able    DM type 2. - Hgb A1c 6.9 08/11/20 - SSI, moderate SSI  - hold home glipizide, metformin   Hx of RLS. - hold outpt lyrica, requip  Hx of Gout. - hold outpt allopurinol   Best practice (evaluated daily)  Diet: NPO Pain/Anxiety/Delirium protocol (if indicated, but not indicated anymore): VAP protocol (if indicated, but not indicated anymore): DVT prophylaxis: lovenox  GI  prophylaxis: Protonix Glucose control: SSI, just like mentioned in assessment and plan Mobility: advance activity as tolerated Disposition: ICU   Goals of Care:  Last date of multidisciplinary goals of care discussion: pending Family and staff present: pending Summary of discussion: pending Follow up goals of care discussion due: pending  Code Status: Full Code   Labs    CMP Latest Ref Rng & Units 08/14/2020 08/13/2020 08/12/2020  Glucose 70 - 99 mg/dL 339(H) 235(H) 215(H)  BUN 8 - 23 mg/dL 45(H) 48(H) 30(H)  Creatinine 0.44 - 1.00 mg/dL 1.34(H) 1.78(H) 1.54(H)  Sodium 135 - 145 mmol/L 139 140 136  Potassium 3.5 - 5.1 mmol/L 3.5 3.6 3.3(L)  Chloride 98 - 111 mmol/L 109 106 101  CO2 22 - 32 mmol/L 19(L) 22 20(L)  Calcium 8.9 - 10.3 mg/dL 8.3(L) 8.3(L) 8.1(L)  Total Protein 6.5 - 8.1 g/dL 5.7(L) - 5.7(L)  Total Bilirubin 0.3 - 1.2 mg/dL 0.6 - 0.7  Alkaline Phos 38 - 126 U/L 114 - 124  AST 15 - 41 U/L 61(H) - 66(H)  ALT 0 - 44 U/L 46(H) - 60(H)    CBC Latest Ref Rng & Units 08/15/2020 08/14/2020 08/12/2020  WBC 4.0 - 10.5 K/uL 5.3 4.6 15.7(H)  Hemoglobin 12.0 - 15.0 g/dL 12.0 11.1(L) 12.5  Hematocrit 36.0 - 46.0 % 37.5 34.1(L) 37.8  Platelets 150 - 400 K/uL 190 165 257    ABG    Component Value Date/Time   PHART 7.397 08/14/2020 0745   PCO2ART 35.8 08/14/2020 0745   PO2ART 60.0 (L) 08/14/2020 0745   HCO3 21.6 08/14/2020 0745   ACIDBASEDEF 2.2 (H) 08/14/2020 0745   O2SAT 89.2 08/14/2020 0745    CBG (last 3)  Recent Labs    08/14/20 1927 08/14/20 2304 08/15/20 0759  GLUCAP 129* 146* 166*    Critical care time: 33 minutes  Chesley Mires, MD [place] Pager - [phone] - [phone] 08/15/2020, 10:08 AM

## 2020-08-15 NOTE — Progress Notes (Signed)
Assisted tele visit to patient with family member.  Latisia Hilaire M, RN  

## 2020-08-15 NOTE — Progress Notes (Signed)
Messaged MD about if they wanted to keep the foley in. Dr. Halford Chessman wanted to keep foley in for strict I&Os. This nurse will continue to monitor.

## 2020-08-15 NOTE — Progress Notes (Signed)
Pt is starting to wake up more. She is now about to communicate verbally. Pt seems more alert but was unable to answer most of my orientation questions. Pt did say she was hungry. I reminded pt that she was NPO until the MD was able to evaluate her and Speech evaluate her as well. This nurse will continue to monitor.

## 2020-08-15 NOTE — Progress Notes (Signed)
Spoke with pt's husband over the phone.  Updated about current status and treatment plan.  Chesley Mires, MD [place] Pager - [phone] 08/15/2020, 1:15 PM

## 2020-08-15 NOTE — Progress Notes (Signed)
eLink Physician-Brief Progress Note Patient Name: [name] DOB: 01/05/1946 MRN: [id number]   Date of Service  08/15/2020  HPI/Events of Note  Patient is on home anti-hypertensive medications which she cannot take because she is NPO.  eICU Interventions  PRN iv Labetalol ordered for SBP > 160 pending resumption of her home anti-hypertensive medications.        Kerry Kass Kionte Baumgardner 08/15/2020, 5:13 AM

## 2020-08-16 ENCOUNTER — Inpatient Hospital Stay (HOSPITAL_COMMUNITY): Payer: Medicare Other

## 2020-08-16 DIAGNOSIS — U071 COVID-19: Secondary | ICD-10-CM | POA: Diagnosis not present

## 2020-08-16 DIAGNOSIS — J1282 Pneumonia due to coronavirus disease 2019: Secondary | ICD-10-CM | POA: Diagnosis not present

## 2020-08-16 LAB — CULTURE, BLOOD (ROUTINE X 2)
Culture: NO GROWTH
Culture: NO GROWTH
Special Requests: ADEQUATE
Special Requests: ADEQUATE

## 2020-08-16 LAB — BASIC METABOLIC PANEL
Anion gap: 14 (ref 5–15)
BUN: 47 mg/dL — ABNORMAL HIGH (ref 8–23)
CO2: 26 mmol/L (ref 22–32)
Calcium: 9.3 mg/dL (ref 8.9–10.3)
Chloride: 108 mmol/L (ref 98–111)
Creatinine, Ser: 1.2 mg/dL — ABNORMAL HIGH (ref 0.44–1.00)
GFR, Estimated: 48 mL/min — ABNORMAL LOW (ref >60)
Glucose, Bld: 160 mg/dL — ABNORMAL HIGH (ref 70–99)
Potassium: 3.5 mmol/L (ref 3.5–5.1)
Sodium: 148 mmol/L — ABNORMAL HIGH (ref 135–145)

## 2020-08-16 LAB — CBC
HCT: 38.8 % (ref 36.0–46.0)
Hemoglobin: 12.7 g/dL (ref 12.0–15.0)
MCH: 29.6 pg (ref 26.0–34.0)
MCHC: 32.7 g/dL (ref 30.0–36.0)
MCV: 90.4 fL (ref 80.0–100.0)
Platelets: 233 10*3/uL (ref 150–400)
RBC: 4.29 MIL/uL (ref 3.87–5.11)
RDW: 14.7 % (ref 11.5–15.5)
WBC: 5 10*3/uL (ref 4.0–10.5)
nRBC: 0 % (ref 0.0–0.2)

## 2020-08-16 LAB — GLUCOSE, CAPILLARY
Glucose-Capillary: 123 mg/dL — ABNORMAL HIGH (ref 70–99)
Glucose-Capillary: 138 mg/dL — ABNORMAL HIGH (ref 70–99)
Glucose-Capillary: 145 mg/dL — ABNORMAL HIGH (ref 70–99)
Glucose-Capillary: 155 mg/dL — ABNORMAL HIGH (ref 70–99)
Glucose-Capillary: 161 mg/dL — ABNORMAL HIGH (ref 70–99)
Glucose-Capillary: 169 mg/dL — ABNORMAL HIGH (ref 70–99)
Glucose-Capillary: 171 mg/dL — ABNORMAL HIGH (ref 70–99)

## 2020-08-16 LAB — POTASSIUM: Potassium: 4 mmol/L (ref 3.5–5.1)

## 2020-08-16 LAB — MAGNESIUM: Magnesium: 2.1 mg/dL (ref 1.7–2.4)

## 2020-08-16 MED ORDER — ASPIRIN EC 81 MG PO TBEC
81.0000 mg | DELAYED_RELEASE_TABLET | Freq: Every day | ORAL | Status: DC
Start: 2020-08-16 — End: 2020-08-21
  Administered 2020-08-16 – 2020-08-21 (×6): 81 mg via ORAL
  Filled 2020-08-16 (×6): qty 1

## 2020-08-16 MED ORDER — VITAMIN D 25 MCG (1000 UNIT) PO TABS
1000.0000 [IU] | ORAL_TABLET | Freq: Every day | ORAL | Status: DC
  Administered 2020-08-16 – 2020-08-21 (×6): 1000 [IU] via ORAL
  Filled 2020-08-16 (×6): qty 1

## 2020-08-16 MED ORDER — ALLOPURINOL 100 MG PO TABS
100.0000 mg | ORAL_TABLET | Freq: Every day | ORAL | Status: DC
  Administered 2020-08-16 – 2020-08-21 (×6): 100 mg via ORAL
  Filled 2020-08-16 (×6): qty 1

## 2020-08-16 MED ORDER — FENTANYL CITRATE (PF) 100 MCG/2ML IJ SOLN
25.0000 ug | INTRAMUSCULAR | Status: DC | PRN
  Administered 2020-08-16: 25 ug via INTRAVENOUS
  Filled 2020-08-16: qty 2

## 2020-08-16 MED ORDER — PREGABALIN 100 MG PO CAPS
100.0000 mg | ORAL_CAPSULE | Freq: Two times a day (BID) | ORAL | Status: DC
  Administered 2020-08-16 – 2020-08-21 (×11): 100 mg via ORAL
  Filled 2020-08-16: qty 2
  Filled 2020-08-16: qty 1
  Filled 2020-08-16: qty 2
  Filled 2020-08-16: qty 1
  Filled 2020-08-16: qty 2
  Filled 2020-08-16: qty 1
  Filled 2020-08-16 (×5): qty 2

## 2020-08-16 MED ORDER — CARVEDILOL 3.125 MG PO TABS
3.1250 mg | ORAL_TABLET | Freq: Two times a day (BID) | ORAL | Status: DC
  Administered 2020-08-16 – 2020-08-21 (×9): 3.125 mg via ORAL
  Filled 2020-08-16 (×10): qty 1

## 2020-08-16 MED ORDER — POTASSIUM CHLORIDE 10 MEQ/100ML IV SOLN
10.0000 meq | INTRAVENOUS | Status: AC
  Administered 2020-08-16 (×3): 10 meq via INTRAVENOUS
  Filled 2020-08-16 (×3): qty 100

## 2020-08-16 NOTE — Progress Notes (Signed)
Pharmacy Antibiotic Note  [name] is a 75 y.o. female admitted on 08/10/2020 with pneumonia.  Pharmacy has been consulted for Meropenem dosing.  Escalating coverage d/t ongoing fever. 08/16/2020  D#6 abx, D#3 meropenem AF, WBC WNL, SCr down to 1.2, CrCl 45 ml/min No positive cultures  Plan: Continues on Meropenem 1g IV q12h - ? De-escalate abx  Height: 5\' 5"  (165.1 cm) Weight: 87.8 kg (193 lb 9 oz) IBW/kg (Calculated) : 57  Temp (24hrs), Avg:98.5 F (36.9 C), Min:98 F (36.7 C), Max:99.8 F (37.7 C)  Recent Labs  Lab 08/11/20 1907 08/12/20 0032 08/13/20 1430 08/14/20 0246 08/15/20 0001 08/15/20 0204 08/16/20 0247  WBC 16.7* 15.7*  --  4.6 5.3  --  5.0  CREATININE 1.64* 1.54* 1.78* 1.34*  --  1.27* 1.20*  LATICACIDVEN 1.8 1.3  --   --   --   --   --     Estimated Creatinine Clearance: 45 mL/min (A) (by C-G formula based on SCr of 1.2 mg/dL (H)).    Allergies  Allergen Reactions  . Amlodipine Swelling  . Depo-Medrol [Methylprednisolone] Hives  . Gabapentin Rash    hives   . Levothyroxine Other (See Comments)    severe muscle pain in neck    . Simvastatin Rash  . Sitagliptin Other (See Comments)    Urinary hesitancy  . Ace Inhibitors     cough  . Betadine [Povidone Iodine]     rash  . Codeine Nausea And Vomiting  . Cortizone-10 [Hydrocortisone] Other (See Comments)    unknown  . Tetracyclines & Related Nausea And Vomiting  . Dexamethasone Rash    Antimicrobials this admission: 1/11 Cefepime >> 1/13 1/11 Vancomycin >> 1/11 1/14 Meropenem >>  Dose adjustments this admission:  Microbiology results: 1/11 MRSA PCR: neg 1/11 resp: normal flora F 1/11 BCx: ngF 1/12 legionella neg 1/12 strep pneumo neg  Thank you for allowing pharmacy to be a part of this patient's care.  Eudelia Bunch, Pharm.D 08/16/2020 11:51 AM

## 2020-08-16 NOTE — Progress Notes (Signed)
NAME:  [name], MRN:  [id number], DOB:  April 09, 1946, LOS: 6 ADMISSION DATE:  08/10/2020, CONSULTATION DATE: 1/11 REFERRING MD: [facility], CHIEF COMPLAINT:  Respiratory Failure    Brief History:  75 y/o F with recent admission for COVID 1/3-1/6 for COVID PNA.  She first had symptoms on Christmas. During that admission, she was also treated as a CHF exacerbation with an LVEF of 20-25%.  She was seen by Cardiology during that admission and recommended for cardiac cath vs coronary artery CT as an outpatient. She received 4 days of remdesivir, did not tolerated decadron due to rash.    On the evening of 1/10 she developed worsening shortness of breath and was found to have sats of 72% on RA.  She ultimately failed NRB and required intubation in the [facility] ER.  Post intubation, she had pinky frothy secretions.  She was transferred to [facility] on 1/14.   Past Medical History:  HTN  MVP  CHF - LVEF 20-25% DM II  GERD  Chronic Back Pain  RLS   Significant Hospital Events:  1/10 Admit, intubated for hypoxic respiratory failure, pink frothy secretions noted 1/11 Tx to [facility], febrile to 103, 60% FiO2 / PEEP 5  1/12 FiO2 40%, PEEP 5, on levophed, fent/diprovan, troponin flat, elevated triglycerides  1/13 Off levophed  1/14 Pt self extubated, to BiPAP 1/15 transitioned to high flow oxygen 1/16 transfer to floor bed  Consults:    Procedures:  ETT 1/10 >> 1/14   Significant Diagnostic Tests:   BLE Venous Duplex 1/12 >> negative for DVT   ECHO 1/14 >> LVEF 20-25%, LV has severely decreased function, no RWMA, RV systolic function not well visualized, LA moderately dilated, MV grossly normal, mild MVR. RA pressure of 72mmHg  Micro Data:  COVID 1/3 >> positive COVID 1gG 1/11 >> negative  MRSA PCR 1/11 >> negative  U. Strep 1/11 >> negative BCx2 1/11 >>  Tracheal aspirate 1/11 >> normal flora   Antimicrobials:  Cefepime 1/11 >> 1/13  Vancomycin 1/11 Meropenem 1/14 >>   Interim History /  Subjective:  No fever.  Improved O2 needs.  Appetite picking up.  Still feels weak.  C/o back/leg pain.  Objective   Blood pressure (!) 173/62, pulse 78, temperature 98.3 F (36.8 C), temperature source Axillary, resp. rate (!) 26, height 5\' 5"  (1.651 m), weight 87.8 kg, SpO2 98 %.        Intake/Output Summary (Last 24 hours) at 08/16/2020 1028 Last data filed at 08/16/2020 0740 Gross per 24 hour  Intake 642.73 ml  Output 2825 ml  Net -2182.27 ml   Filed Weights   08/10/20 2213 08/14/20 0500 08/15/20 0500  Weight: 92 kg 93.2 kg 87.8 kg    Examination:  General - more alert Eyes - pupils reactive ENT - no sinus tenderness, no stridor Cardiac - regular rate/rhythm, no murmur Chest - equal breath sounds b/l, no wheezing or rales Abdomen - soft, non tender, + bowel sounds Extremities - no cyanosis, clubbing, or edema Skin - no rashes Neuro - moves extremities, follows commands   Resolved Hospital Problem list   Shock - suspect sedation related, AKI, Elevated LFTs  Assessment & Plan:   Acute hypoxic/hypercapnic respiratory failure from HCAP, acute pulmonary edema in setting of recent COVID 19 pneumonia. 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 Hx of HTN. - continue IV lasix -  seen by cardiology 1/05 >> will need f/u when more stable - hold outpt norvasc, coreg, ASA until she is able to swallow pills  Acute metabolic encephalopathy from hypoxia. - improving - monitor mental status  DM type 2. - Hgb A1c 6.9 08/11/20 - SSI - hold home glipizide, metformin   Hx of RLS. - hold outpt lyrica, requip - prn fentanyl IV for now  Hx of Gout. - hold outpt allopurinol   Best practice  Diet: NPO DVT prophylaxis: lovenox  GI prophylaxis: no longer indicated Mobility: PT/OT Disposition: telemetry Code Status: Full Code   Transfer to telemetry 1/16.  To triad 1/17 and PCCM  off.  Labs    CMP Latest Ref Rng & Units 08/16/2020 08/15/2020 08/14/2020  Glucose 70 - 99 mg/dL 160(H) 180(H) 339(H)  BUN 8 - 23 mg/dL 47(H) 40(H) 45(H)  Creatinine 0.44 - 1.00 mg/dL 1.20(H) 1.27(H) 1.34(H)  Sodium 135 - 145 mmol/L 148(H) 148(H) 139  Potassium 3.5 - 5.1 mmol/L 3.5 3.4(L) 3.5  Chloride 98 - 111 mmol/L 108 110 109  CO2 22 - 32 mmol/L 26 23 19(L)  Calcium 8.9 - 10.3 mg/dL 9.3 9.3 8.3(L)  Total Protein 6.5 - 8.1 g/dL - 6.5 5.7(L)  Total Bilirubin 0.3 - 1.2 mg/dL - 1.2 0.6  Alkaline Phos 38 - 126 U/L - 99 114  AST 15 - 41 U/L - 68(H) 61(H)  ALT 0 - 44 U/L - 50(H) 46(H)    CBC Latest Ref Rng & Units 08/16/2020 08/15/2020 08/14/2020  WBC 4.0 - 10.5 K/uL 5.0 5.3 4.6  Hemoglobin 12.0 - 15.0 g/dL 12.7 12.0 11.1(L)  Hematocrit 36.0 - 46.0 % 38.8 37.5 34.1(L)  Platelets 150 - 400 K/uL 233 190 165    ABG    Component Value Date/Time   PHART 7.397 08/14/2020 0745   PCO2ART 35.8 08/14/2020 0745   PO2ART 60.0 (L) 08/14/2020 0745   HCO3 21.6 08/14/2020 0745   ACIDBASEDEF 2.2 (H) 08/14/2020 0745   O2SAT 89.2 08/14/2020 0745    CBG (last 3)  Recent Labs    08/15/20 1948 08/16/20 0001 08/16/20 0746  GLUCAP 148* 161* 171*    Signature:  Chesley Mires, MD [place] Pager - [phone] 08/16/2020, 10:28 AM

## 2020-08-16 NOTE — Progress Notes (Signed)
[place] Progress Note Patient Name: [name] DOB: 22-Jul-1946 MRN: [id number]   Date of Service  08/16/2020  HPI/Events of Note  K 3.5, Cr 1.2, CHF  Kcl 10  meq intravenous every hr x 4 doses.over 4 follow K post.  eICU Interventions  As above.      Intervention Category Intermediate Interventions: Electrolyte abnormality - evaluation and management  Elmer Sow 08/16/2020, 5:12 AM

## 2020-08-16 NOTE — Evaluation (Signed)
Clinical/Bedside Swallow Evaluation Patient Details  Name: [name] MRN: [id number] Date of Birth: 04-Mar-1946  Today's Date: 08/16/2020 Time: SLP Start Time (ACUTE ONLY): 1155 SLP Stop Time (ACUTE ONLY): 1220 SLP Time Calculation (min) (ACUTE ONLY): 25 min  Past Medical History:  Past Medical History:  Diagnosis Date  . Arthritis   . Chronic back pain    buldging disc,scoliosis,arthritis  . Diabetes mellitus without complication ([place])    takes Trulicity,Jardiance,and Metformin daily.Average fasting blood sugar runs around130  . GERD (gastroesophageal reflux disease)    takes Omeprazole daily  . History of bronchitis > 8 yrs ago  . History of shingles   . HTN (hypertension)    takes Amlodipine and Micardis daily  . Hx of colonic polyps    benign  . Internal and external hemorrhoids without complication   . Joint pain   . Mitral valve prolapse   . Nocturia   . PONV (postoperative nausea and vomiting)    when gets injections in joints gets hives.Betadine rash  . Restless leg syndrome    takes Requip at bedtime  . Seasonal allergies    takes Claritin daily as needed  . Urinary frequency   . Uterine fibroid    Past Surgical History:  Past Surgical History:  Procedure Laterality Date  . BUNIONECTOMY Bilateral   . COLONOSCOPY  07/12/2004   diverticulosis, internal and external hemorrhoids  . COLONOSCOPY    . FINGER ARTHROSCOPY WITH CARPOMETACARPEL [facility]) ARTHROPLASTY Right 09/28/2015   Procedure: RIGHT THUMB TRAPEZIUM EXCISION WITH CARPOMETACARPEL ([place]) ARTHROPLASTY AND TENDON TRANSFER;  Surgeon: Iran Planas, MD;  Location: [place];  Service: Orthopedics;  Laterality: Right;  . HEMORRHOID SURGERY     almost 40 yrs ago  . HYSTEROSCOPY WITH D & C  08/23/2000   and resectoscopic myomectomy  . LUMBAR DISC SURGERY  03/16/2005  . LUMBAR EPIDURAL INJECTION    . PLANTAR FASCIA RELEASE Left 02/03/2010   and torn tendon  . TENDON TRANSFER Right 09/28/2015   Procedure: TENDON  TRANSFER;  Surgeon: Iran Planas, MD;  Location: [place];  Service: Orthopedics;  Laterality: Right;  . TONSILLECTOMY    . TUBAL LIGATION    . WRIST SURGERY     left, removal of cyst   HPI:  Patient is a 75 y.o. female with PMH: HTN, MVP, CHF, DM-2, GERD, chronic back pain, RLS. She was a recent admission for COVID PNAfrom 1/3-1/6 and was also treated for CHF exacerbation. On evening of 1/10, she developed worsening SOB and oxygen saturations were 72% on room air. She failed NRB and required intubation on 1/10 in ER at [place] extubation she had frothy pink secretions. She self extubated on 1/14 and placed on BiPAP and on 1/15 transitioned to high flow oxygen.  CXR revealed Persistent bilateral pleural effusions with adjacent atelectasis; hazy persistent bilateral airspace opacities.   Assessment / Plan / Recommendation Clinical Impression  Patient presents with a mild oral and mild-moderate pharyngeal dysphagia. Of note, patient appeared fatigued which likely influenced her oral phase. She exhibited a mild delay in mastication of ice chips. Swallow initiation with thin liquids via cup sips and straw sips appeared swift and no immediate throat clearing, coughing or other s/s observed. Oxygen saturation in high 90's to 100% and RR in range of 19-25. Patient did have a cough/throat clear that was not productive and would occur intermittently during evaluation. SLP performed oral care with toothette sponge with small amount of water and patient then  coughed for several seconds. She exhibited this same cough intermittently but not consistently throughout evaluation and suspect it is related more to PNA than secondary to PO's. Patient able to consume thin liquids (water) via cup and straw sips, puree solids (applesauce) and gelatin without any immediate coughing/throat clearing and without any changes in vitals. Patient's voice was low in intensity but clear and remained so throughout  evaluation. SLP recommending to start with full liquids diet (thin liquids) and monitor patient's tolerance. SLP Visit Diagnosis: Dysphagia, unspecified (R13.10)    Aspiration Risk  Mild aspiration risk    Diet Recommendation Thin liquid;Other (Comment) (full liquids)   Liquid Administration via: Cup;Straw Medication Administration: Crushed with puree Supervision: Patient able to self feed;Staff to assist with self feeding Compensations: Minimize environmental distractions;Slow rate;Small sips/bites Postural Changes: Seated upright at 90 degrees    Other  Recommendations Oral Care Recommendations: Oral care BID   Follow up Recommendations None      Frequency and Duration min 2x/week  1 week       Prognosis Prognosis for Safe Diet Advancement: Good      Swallow Study   General Date of Onset: 08/10/20 HPI: Patient is a 75 y.o. female with PMH: HTN, MVP, CHF, DM-2, GERD, chronic back pain, RLS. She was a recent admission for COVID PNAfrom 1/3-1/6 and was also treated for CHF exacerbation. On evening of 1/10, she developed worsening SOB and oxygen saturations were 72% on room air. She failed NRB and required intubation on 1/10 in ER at [employer] extubation she had frothy pink secretions. She self extubated on 1/14 and placed on BiPAP and on 1/15 transitioned to high flow oxygen.  CXR revealed Persistent bilateral pleural effusions with adjacent atelectasis; hazy persistent bilateral airspace opacities. Type of Study: Bedside Swallow Evaluation Previous Swallow Assessment: None found Diet Prior to this Study: NPO Temperature Spikes Noted: No Respiratory Status: Nasal cannula History of Recent Intubation: Yes Length of Intubations (days): 7 days Date extubated: 08/14/20 Behavior/Cognition: Alert;Cooperative;Pleasant mood Oral Cavity Assessment: Within Functional Limits Oral Care Completed by SLP: Yes Oral Cavity - Dentition: Adequate natural dentition Vision:  Functional for self-feeding Self-Feeding Abilities: Able to feed self;Needs assist Patient Positioning: Upright in bed Baseline Vocal Quality: Low vocal intensity Volitional Cough: Strong Volitional Swallow: Able to elicit    Oral/Motor/Sensory Function Overall Oral Motor/Sensory Function: Within functional limits   Ice Chips Ice chips: Impaired Oral Phase Impairments: Impaired mastication Other Comments: Mildly delayed mastication   Thin Liquid Thin Liquid: Impaired Presentation: Straw;Cup;Self Fed Pharyngeal  Phase Impairments: Cough - Delayed;Throat Clearing - Delayed Other Comments: Prior to PO's, patient would have intermittent unproductive cough (RN has said she has been able to expectorate some secretions). Frequency of cough and throat clear did not change during PO intake and inconsistently occuring. SLP suspects cough related to PNA but not necessarily from PO's    Nectar Thick     Honey Thick     Puree Puree: Within functional limits   Solid     Solid: Not tested      Sonia Baller, MA, CCC-SLP Speech Therapy

## 2020-08-17 DIAGNOSIS — I509 Heart failure, unspecified: Secondary | ICD-10-CM | POA: Diagnosis not present

## 2020-08-17 LAB — GLUCOSE, CAPILLARY
Glucose-Capillary: 111 mg/dL — ABNORMAL HIGH (ref 70–99)
Glucose-Capillary: 113 mg/dL — ABNORMAL HIGH (ref 70–99)
Glucose-Capillary: 141 mg/dL — ABNORMAL HIGH (ref 70–99)
Glucose-Capillary: 155 mg/dL — ABNORMAL HIGH (ref 70–99)
Glucose-Capillary: 246 mg/dL — ABNORMAL HIGH (ref 70–99)
Glucose-Capillary: 252 mg/dL — ABNORMAL HIGH (ref 70–99)
Glucose-Capillary: 318 mg/dL — ABNORMAL HIGH (ref 70–99)

## 2020-08-17 LAB — BASIC METABOLIC PANEL
Anion gap: 14 (ref 5–15)
BUN: 52 mg/dL — ABNORMAL HIGH (ref 8–23)
CO2: 26 mmol/L (ref 22–32)
Calcium: 8.9 mg/dL (ref 8.9–10.3)
Chloride: 102 mmol/L (ref 98–111)
Creatinine, Ser: 1.15 mg/dL — ABNORMAL HIGH (ref 0.44–1.00)
GFR, Estimated: 50 mL/min — ABNORMAL LOW (ref >60)
Glucose, Bld: 133 mg/dL — ABNORMAL HIGH (ref 70–99)
Potassium: 3.5 mmol/L (ref 3.5–5.1)
Sodium: 142 mmol/L (ref 135–145)

## 2020-08-17 MED ORDER — ADULT MULTIVITAMIN W/MINERALS CH
1.0000 | ORAL_TABLET | Freq: Every day | ORAL | Status: DC
  Administered 2020-08-17 – 2020-08-21 (×5): 1 via ORAL
  Filled 2020-08-17 (×5): qty 1

## 2020-08-17 MED ORDER — SODIUM CHLORIDE 0.9 % IV SOLN
1.0000 g | Freq: Two times a day (BID) | INTRAVENOUS | Status: AC
  Administered 2020-08-17 – 2020-08-18 (×3): 1 g via INTRAVENOUS
  Filled 2020-08-17 (×5): qty 1

## 2020-08-17 MED ORDER — PROSOURCE PLUS PO LIQD
30.0000 mL | Freq: Two times a day (BID) | ORAL | Status: DC
  Administered 2020-08-17 – 2020-08-20 (×5): 30 mL via ORAL
  Filled 2020-08-17 (×7): qty 30

## 2020-08-17 MED ORDER — POTASSIUM CHLORIDE CRYS ER 20 MEQ PO TBCR
40.0000 meq | EXTENDED_RELEASE_TABLET | Freq: Once | ORAL | Status: AC
  Administered 2020-08-17: 40 meq via ORAL
  Filled 2020-08-17: qty 2

## 2020-08-17 MED ORDER — ROPINIROLE HCL 1 MG PO TABS
2.0000 mg | ORAL_TABLET | Freq: Every day | ORAL | Status: DC
  Administered 2020-08-17 – 2020-08-20 (×4): 2 mg via ORAL
  Filled 2020-08-17 (×5): qty 2

## 2020-08-17 MED ORDER — AMLODIPINE BESYLATE 5 MG PO TABS
5.0000 mg | ORAL_TABLET | Freq: Every day | ORAL | Status: DC
  Administered 2020-08-18 – 2020-08-21 (×4): 5 mg via ORAL
  Filled 2020-08-17 (×4): qty 1

## 2020-08-17 MED ORDER — ENSURE ENLIVE PO LIQD
237.0000 mL | Freq: Two times a day (BID) | ORAL | Status: DC
  Administered 2020-08-17 – 2020-08-21 (×7): 237 mL via ORAL

## 2020-08-17 MED ORDER — INSULIN ASPART 100 UNIT/ML ~~LOC~~ SOLN
0.0000 [IU] | Freq: Three times a day (TID) | SUBCUTANEOUS | Status: DC
  Administered 2020-08-17: 11 [IU] via SUBCUTANEOUS
  Administered 2020-08-18 – 2020-08-19 (×5): 3 [IU] via SUBCUTANEOUS
  Administered 2020-08-19: 2 [IU] via SUBCUTANEOUS
  Administered 2020-08-20 (×2): 3 [IU] via SUBCUTANEOUS
  Administered 2020-08-20 – 2020-08-21 (×2): 5 [IU] via SUBCUTANEOUS

## 2020-08-17 NOTE — Progress Notes (Signed)
eLink Physician-Brief Progress Note Patient Name: [name] DOB: Oct 08, 1945 MRN: [id number]   Date of Service  08/17/2020  HPI/Events of Note  Pt having periods of hypotension with MAP40-60 that rebounds over time.  AM K+ 3.5 with creat 1.15 and GFR 50.  CHF.   eICU Interventions  - hold lasix.Marland Kitchen watch BP. Will replace Kcl, can take oral.  Notified RN     Intervention Category Intermediate Interventions: Electrolyte abnormality - evaluation and management;Hypotension - evaluation and management  Elmer Sow 08/17/2020, 4:11 AM

## 2020-08-17 NOTE — TOC Progression Note (Signed)
Transition of Care [facility]) - Progression Note    Patient Details  Name: MEMORI SAMMON MRN: [id number] Date of Birth: Oct 25, 1945  Transition of [facility]) CM/SW Contact  Leeroy Cha, RN Phone Number: 08/17/2020, 9:30 AM  Clinical Narrative:    [facility] Events:  1/10 Admit, intubated for hypoxic respiratory failure, pink frothy secretions noted 1/11 Tx to [facility], febrile to 103, 60% FiO2 / PEEP 5  1/12 FiO2 40%, PEEP 5, on levophed, fent/diprovan, troponin flat, elevated triglycerides  1/13 Off levophed  1/14 Pt self extubated, to BiPAP 1/15 transitioned to high flow oxygen 1/16 transfer to floor bed  REMAINS IN ICU PLAN IS TO BE DETERMINED HOME WITH SELF CARE PROGRESSION: FOLLOWING  Expected Discharge Plan: Home/Self Care Barriers to Discharge: Continued Medical Work up  Expected Discharge Plan and Services Expected Discharge Plan: Home/Self Care   Discharge Planning Services: CM Consult   Living arrangements for the past 2 months: Single Family Home                                       Social Determinants of Health (SDOH) Interventions    Readmission Risk Interventions No flowsheet data found.

## 2020-08-17 NOTE — Progress Notes (Signed)
Nutrition Follow-up  RD working remotely.  DOCUMENTATION CODES:   Obesity unspecified  INTERVENTION:  - will order Ensure Enlive BID, each supplement provides 350 kcal and 20 grams of protein. - will order 30 ml Prosource Plus BID, each supplement provides 100 kcal and 15 grams protein.  - will order 1 tablet multivitamin with minerals/day.   NUTRITION DIAGNOSIS:   Increased nutrient needs related to acute illness,catabolic illness ([id number]-66 infection) as evidenced by estimated needs -ongoing  GOAL:   Patient will meet greater than or equal to 90% of their needs -unmet at this time.  MONITOR:   PO intake,Supplement acceptance,Diet advancement,Labs,Weight trends,Skin  ASSESSMENT:   75 year-old female with medical history of COVID-19, arthritis, chronic back pain, CBH, DM, GERD, shingles, HTN, colonic polyps, internal and external hemorrhoids, joint pain, mitral valve prolapse, nocturia, RLS, urinary frequency, and uterine fibroid. She was discharged from the hospital on 1/6 after being admitted on 1/3 due to COVID-19 infections (symptoms began 12/25). She presented to the ED on 1/10 with SOB. She was satting 72% on room air, failed NRB, and was intubated in the [facility] ED prior to transfer to [facility].  Significant Events: 1/11- intubated and OGT placed in [facility] ED; transfer to [facility] 1/12- initial RD assessment; TF initiation 1/14- self-extubated 1/16- diet advanced from NPO to FLD at 1220   No intakes documented since diet advancement. Estimated nutrition needs updated following self-extubation. Weight trending down since admission but stable over the past 2 days. Non-pitting edema to all extremities documented in the edema section of flow sheet.     Labs reviewed; CBG: 113 mg/dl, BUN: 52 mg/dl, creatinine: 1.15 mg/dl, GFR: 50 ml/min. Medications reviewed; 1000 units cholecalciferol/day, 40 mg IV lasix BID, sliding scale novolog, 40 mEq Klor-Con x1 dose 1/17.    Diet  Order:   Diet Order            Diet full liquid Room service appropriate? Yes; Fluid consistency: Thin  Diet effective now                 EDUCATION NEEDS:   Not appropriate for education at this time  Skin:  Skin Assessment: Skin Integrity Issues: Skin Integrity Issues:: Stage I Stage I: R buttocks  Last BM:  1/14  Height:   Ht Readings from Last 1 Encounters:  08/10/20 5\' 5"  (1.651 m)    Weight:   Wt Readings from Last 1 Encounters:  08/17/20 86.6 kg     Estimated Nutritional Needs:  Kcal:  2130-2300 kcal Protein:  100-115 grams Fluid:  >/= 2.5 L/day       Kristen Matin, MS, RD, LDN, CNSC Inpatient Clinical Dietitian RD pager # available in [place]  After hours/weekend pager # available in [facility]

## 2020-08-17 NOTE — Progress Notes (Signed)
  Speech Language Pathology Treatment: Dysphagia  Patient Details Name: [name] MRN: [id number] DOB: 10-05-1945 Today's Date: 08/17/2020 Time: 1530-1550 SLP Time Calculation (min) (ACUTE ONLY): 20 min  Assessment / Plan / Recommendation Clinical Impression  Patient seen to address dysphagia goals. Patient sitting up in recliner, and much more alert than previous date, voice is stronger. She said "everything I ate today ran right through me" (per RN, she had soft stools today). Patient self-fed saltine crackers, Jello, applesauce and drank thin liquids (juice) via straw sips. She made a comment "Why does everything taste so nasty?" Patient exhibited mild delay in mastication with crackers but overall, tolerated all PO's well. One instance of cough that was similar to cough observed yesterday. RR in range of 15-18 and SpO2 in mid to upper 90%"s. At this time, SLP is recommending to upgrade patient from full liquids to Dys 3 solids, continue with thin liquids.    HPI HPI: Patient is a 75 y.o. female with PMH: HTN, MVP, CHF, DM-2, GERD, chronic back pain, RLS. She was a recent admission for COVID PNAfrom 1/3-1/6 and was also treated for CHF exacerbation. On evening of 1/10, she developed worsening SOB and oxygen saturations were 72% on room air. She failed NRB and required intubation on 1/10 in ER at [place] extubation she had frothy pink secretions. She self extubated on 1/14 and placed on BiPAP and on 1/15 transitioned to high flow oxygen.  CXR revealed Persistent bilateral pleural effusions with adjacent atelectasis; hazy persistent bilateral airspace opacities.      SLP Plan  Continue with current plan of care       Recommendations  Diet recommendations: Dysphagia 3 (mechanical soft);Thin liquid Liquids provided via: Cup;Straw Medication Administration: Whole meds with puree Supervision: Patient able to self feed;Intermittent supervision to cue for compensatory  strategies Compensations: Minimize environmental distractions;Slow rate;Small sips/bites Postural Changes and/or Swallow Maneuvers: Seated upright 90 degrees                Oral Care Recommendations: Oral care BID Follow up Recommendations: None SLP Visit Diagnosis: Dysphagia, unspecified (R13.10) Plan: Continue with current plan of care       GO                Sonia Baller, MA, CCC-SLP Speech Therapy

## 2020-08-17 NOTE — Progress Notes (Addendum)
Physical Therapy Treatment Patient Details Name: [name] MRN: [id number] DOB: 11/02/1945 Today's Date: 08/17/2020    History of Present Illness Pt68 yo  presents to the ED 08/10/20  with sob.  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 In ED 1/10, required intubation. Extubated1/14/22.    PT Comments    Assisted  Patient from recliner to and from [facility] for a BM. Requires mod steady assistnace to stand. Patient is quite weak.  Continue PT.  Follow Up Recommendations  SNF vs HHPT if improves  Well enough to Dc home     Equipment Recommendations  None recommended by PT    Recommendations for Other Services       Precautions / Restrictions Precautions Precautions: Fall Precaution Comments: monitor sats BP    Mobility  Bed Mobility General bed mobility comments: in recliner  Transfers Overall transfer level: Needs assistance   Transfers: Sit to/from Stand;Stand Pivot Transfers Sit to Stand: Mod assist;+2 safety/equipment Stand pivot transfers: +2 safety/equipment       General transfer comment: mod assist to pivot to [facility] and back to recliner. reuired 2 to assist with post  BM hygiene stnad.  Ambulation/Gait                 Stairs             Wheelchair Mobility    Modified Rankin (Stroke Patients Only)       Balance Overall balance assessment: Needs assistance Sitting-balance support: Feet supported;Bilateral upper extremity supported Sitting balance-Leahy Scale: Fair     Standing balance support: During functional activity;Single extremity supported Standing balance-Leahy Scale: Poor Standing balance comment: reliant on  UE support.                            Cognition Arousal/Alertness: Awake/alert Behavior During Therapy: WFL for tasks assessed/performed Overall Cognitive Status: Within Functional Limits for tasks assessed                                         Exercises      General Comments        Pertinent Vitals/Pain Pain Assessment: No/denies pain Pain Score: 0-No pain    Home Living Family/patient expects to be discharged to:: Private residence Living Arrangements: Spouse/significant other;Children Available Help at Discharge: Family;Available 24 hours/day Type of Home: House Home Access: Stairs to enter   Home Layout: One level Home Equipment: Environmental consultant - 2 wheels      Prior Function Level of Independence: Independent      Comments: drives   PT Goals (current goals can now be found in the care plan section) Acute Rehab PT Goals Patient Stated Goal: to go home PT Goal Formulation: With patient Time For Goal Achievement: 08/31/20 Potential to Achieve Goals: Good Progress towards PT goals: Progressing toward goals    Frequency    Min 2X/week      PT Plan Current plan remains appropriate    Co-evaluation              AM-PAC PT "6 Clicks" Mobility   Outcome Measure  Help needed turning from your back to your side while in a flat bed without using bedrails?: A Lot Help needed moving from lying on your back to sitting on the side of a flat  bed without using bedrails?: A Lot Help needed moving to and from a bed to a chair (including a wheelchair)?: A Lot Help needed standing up from a chair using your arms (e.g., wheelchair or bedside chair)?: A Lot Help needed to walk in hospital room?: Total Help needed climbing 3-5 steps with a railing? : Total 6 Click Score: 10    End of Session Equipment Utilized During Treatment: Oxygen Activity Tolerance: Patient tolerated treatment well Patient left: in chair;with call bell/phone within reach;with nursing/sitter in room Nurse Communication: Mobility status PT Visit Diagnosis: Unsteadiness on feet (R26.81);Muscle weakness (generalized) (M62.81);Difficulty in walking, not elsewhere classified (R26.2)     Time: 8338-2505 PT Time Calculation (min) (ACUTE ONLY): 20  min  Charges:  $Therapeutic Activity: 8-22 mins                     Tresa Endo PT Acute Rehabilitation Services Pager [phone] Office [phone]    Claretha Cooper 08/17/2020, 11:39 AM

## 2020-08-17 NOTE — Evaluation (Signed)
Physical Therapy Evaluation Patient Details Name: [name] MRN: [id number] DOB: [birth year] Today's Date: 08/17/2020   History of Present Illness  Pt68 yo  presents to the ED 08/10/20  with sob.  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 In ED 1/10, required intubation. Extubated1/14/22.  Clinical Impression  The patient does present with weakness and requires mod assistance for transfers to [facility] and recliner. Patient resides with spouse and daughter. If patient progresses, may be able to return home. Patient's SPo2 on 5 L remained >94 %. HR remained in 80's. No dizzinees first time OOB.  Pt admitted with above diagnosis. Pt currently with functional limitations due to the deficits listed below (see PT Problem List). Pt will benefit from skilled PT to increase their independence and safety with mobility to allow discharge to the venue listed below.        Follow Up Recommendations SNF , HHPT if able to regain strength to return home.    Equipment Recommendations  None recommended by PT    Recommendations for Other Services       Precautions / Restrictions Precautions Precautions: Fall Precaution Comments: monitor sats BP      Mobility  Bed Mobility Overal bed mobility: Needs Assistance Bed Mobility: Sidelying to Sit;Supine to Sit     Supine to sit: Min guard     General bed mobility comments: patient initiated moving legs to bed edge.    Transfers Overall transfer level: Needs assistance   Transfers: Sit to/from Stand;Stand Pivot Transfers Sit to Stand: Mod assist Stand pivot transfers: Mod assist       General transfer comment: stand pivot to [facility] then to recliner. HHA and use of armrests.  Ambulation/Gait                Stairs            Wheelchair Mobility    Modified Rankin (Stroke Patients Only)       Balance Overall balance assessment: Needs assistance Sitting-balance support: Feet  supported;Bilateral upper extremity supported Sitting balance-Leahy Scale: Fair     Standing balance support: During functional activity;Single extremity supported Standing balance-Leahy Scale: Poor Standing balance comment: reliant on  UE support.                             Pertinent Vitals/Pain Pain Assessment: No/denies pain    Home Living Family/patient expects to be discharged to:: Private residence Living Arrangements: Spouse/significant other;Children Available Help at Discharge: Family;Available 24 hours/day Type of Home: House Home Access: Stairs to enter   [employer] of Steps: 1 Home Layout: One level Home Equipment: Environmental consultant - 2 wheels      Prior Function Level of Independence: Independent         Comments: drives     Hand Dominance        Extremity/Trunk Assessment   Upper Extremity Assessment Upper Extremity Assessment: Generalized weakness    Lower Extremity Assessment Lower Extremity Assessment: Generalized weakness    Cervical / Trunk Assessment Cervical / Trunk Assessment: Normal  Communication   Communication: No difficulties  Cognition Arousal/Alertness: Awake/alert Behavior During Therapy: WFL for tasks assessed/performed Overall Cognitive Status: Within Functional Limits for tasks assessed  General Comments      Exercises     Assessment/Plan    PT Assessment Patient needs continued PT services  PT Problem List Decreased strength;Decreased mobility;Decreased safety awareness;Decreased knowledge of precautions;Decreased activity tolerance;Cardiopulmonary status limiting activity;Decreased balance;Decreased knowledge of use of DME       PT Treatment Interventions DME instruction;Therapeutic activities;Gait training;Therapeutic exercise;Patient/family education;Functional mobility training    PT Goals (Current goals can be found in the Care Plan  section)  Acute Rehab PT Goals Patient Stated Goal: to go home PT Goal Formulation: With patient Time For Goal Achievement: 08/31/20 Potential to Achieve Goals: Good    Frequency Min 2X/week   Barriers to discharge        Co-evaluation               AM-PAC PT "6 Clicks" Mobility  Outcome Measure Help needed turning from your back to your side while in a flat bed without using bedrails?: A Lot Help needed moving from lying on your back to sitting on the side of a flat bed without using bedrails?: A Lot Help needed moving to and from a bed to a chair (including a wheelchair)?: A Lot Help needed standing up from a chair using your arms (e.g., wheelchair or bedside chair)?: A Lot Help needed to walk in hospital room?: Total Help needed climbing 3-5 steps with a railing? : Total 6 Click Score: 10    End of Session Equipment Utilized During Treatment: Oxygen Activity Tolerance: Patient limited by fatigue Patient left: in chair;with call bell/phone within reach;with nursing/sitter in room Nurse Communication: Mobility status PT Visit Diagnosis: Unsteadiness on feet (R26.81);Muscle weakness (generalized) (M62.81);Difficulty in walking, not elsewhere classified (R26.2)    Time: 8185-6314 PT Time Calculation (min) (ACUTE ONLY): 31 min   Charges:   PT Evaluation $PT Eval Low Complexity: 1 Low PT Treatments $Therapeutic Activity: 8-22 mins         Tresa Endo PT Acute Rehabilitation Services Pager [phone] Office [phone]   Claretha Cooper 08/17/2020, 11:27 AM

## 2020-08-17 NOTE — Progress Notes (Signed)
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% Admit 08/03/2020 progressive worsening shortness of breath Started SOB symptoms 12/25 COVID found to be positive-admitted 1/3 started on remdesivir Decadron-eventually found to have acute systolic heart failure (outpatient cath planned) and was sent home on Lasix-received 4 days of remdesivir   return to [facility] ED-O2 sat 72% RA on nonrebreather 100% only went up to 88% on 1/10  Intubated in [facility] emergency room-developed septic shock requiring Levophed-transferred to [facility] long hospital ICU Duplex negative for DVT Self extubated 1/15 Echo repeat 1/14 EF 20-25%   Assessment & Plan:   Active Problems:   Pneumonia due to COVID-19 virus   CHF exacerbation ([place])   Acute respiratory failure with hypoxia and hypercarbia (HCC)   Pressure injury of skin   1. Acute hypoxic respiratory failure from HCAP/pulmonary edema/COVID-19 pneumonia 2. COVID-19 pneumonia/HCAP superimposed a. Antibiotics since 1/11 will complete on 1/18-blood culture negative from 1/11 b. Periodic chest x-ray c. De-escalate oxygen with desat screens over the next several days 3. 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 Continue Lasix 40 IV twice daily--at home medicine Lasix nave so we will likely switch to p.o. Lasix in 24 hours b. Continue Coreg 3.125 twice daily c. Resume amlodipine 5 daily today d. Fluid negative balance but remove Foley today 4. Metabolic encephalopathy from hypoxia a. Improved can tell me time place here 5. DM TY 2 A1c 6.9 a. Blood sugar ranging 1 11-2 46 b. Continue sliding scale changed to 4 times daily AC at bedtime c. On discharge resume dulaglutide 0.75 weekly and would consider outpatient resumption medical up twice daily d. Can  continue Lyrica 100 twice daily 6. History of RLS a. Resume Requip to nightly 7. Gout a. Continue allopurinol 100 daily  DVT prophylaxis: Lovenox Code Status: Full Family Communication: Called granddaughters number which is written on the room door and call daughter with no response Disposition:  Status is: Inpatient pending completion of antibiotics not negative fluid status over the next 1 to 2 days  Remains inpatient appropriate because:Hemodynamically unstable, Altered mental status and Ongoing diagnostic testing needed not appropriate for outpatient work up   Dispo: The patient is from: Home              Anticipated d/c is to: Home likely with home health if continues to do well              Anticipated d/c date is: 2 days              Patient currently is not medically stable to d/c.  Consultants:   None  Procedures: No  Antimicrobials: Meropenem   Subjective:  Looks well sitting in chair using--using minimal oxygen 2 to 3 L No chest pain Some diarrhea today  Objective: Vitals:   08/17/20 0300 08/17/20 0330 08/17/20 0426 08/17/20 0530  BP: (!) 120/52 (!) 103/59    Pulse: 66 65    Resp: (!) 21 20    Temp:    97.6 F (36.4 C)  TempSrc:    Oral  SpO2: 97% 97%    Weight:   86.6 kg   Height:        Intake/Output Summary (Last 24 hours) at 08/17/2020 0732 Last data filed at 08/17/2020 0425 Gross per 24 hour  Intake 172.13 ml  Output 2700 ml  Net -2527.87 ml   Filed Weights   08/14/20 0500 08/15/20 0500 08/17/20 0426  Weight: 93.2 kg 87.8 kg 86.6 kg    Examination:  Awake coherent no distress EOMI NCAT no focal deficit CTA B no added sounds Abdomen soft no rebound no guarding Neurologically intact no focal deficit Foley in place Skin soft supple Neurologically intact abdomen--power without focal deficit   Data Reviewed: I have personally reviewed following labs and imaging studies  Sodium 142 potassium 3.5 BUN/creatinine 24/1.5-->52/1.15 White  count 5.0, hemoglobin 12.7, platelet 233  COVID-19 Labs  No results for input(s): DDIMER, FERRITIN, LDH, CRP in the last 72 hours.  No results found for: [place]   Radiology Studies: DG Chest Port 1 View  Result Date: 08/16/2020 CLINICAL DATA:  Respiratory failure EXAM: PORTABLE CHEST 1 VIEW COMPARISON:  August 14, 2020 FINDINGS: There are moderate-sized bilateral pleural effusions, stable from prior study. The heart size is stable. Bibasilar atelectasis is noted. There are hazy persistent bilateral airspace opacities without evidence for pneumothorax. Aortic calcifications are noted. IMPRESSION: 1. Persistent bilateral pleural effusions with adjacent atelectasis. 2. Hazy persistent bilateral airspace opacities,. Electronically Signed   By: Constance Holster M.D.   On: 08/16/2020 05:57     Scheduled Meds: . (feeding supplement) PROSource Plus  30 mL Oral BID BM  . allopurinol  100 mg Oral Daily  . amLODipine  5 mg Oral Daily  . aspirin EC  81 mg Oral Daily  . carvedilol  3.125 mg Oral BID WC  . chlorhexidine gluconate (MEDLINE KIT)  15 mL Mouth Rinse BID  . Chlorhexidine Gluconate Cloth  6 each Topical Daily  . cholecalciferol  1,000 Units Oral Daily  . enoxaparin (LOVENOX) injection  40 mg Subcutaneous Q24H  . feeding supplement  237 mL Oral BID BM  . furosemide  40 mg Intravenous Q12H  . insulin aspart  0-15 Units Subcutaneous TID WC  . mouth rinse  15 mL Mouth Rinse q12n4p  . multivitamin with minerals  1 tablet Oral Daily  . pregabalin  100 mg Oral BID  . rOPINIRole  2 mg Oral QHS   Continuous Infusions: . sodium chloride 250 mL (08/11/20 1958)  . meropenem (MERREM) IV       LOS: 7 days    Time spent: 66  Nita Sells, MD [facility] To contact the attending provider between 7A-7P or the covering provider during after hours 7P-7A, please log into the web site [email or web address] and access using universal [place] password for that web site. If you  do not have the password, please call the hospital operator.  08/17/2020, 7:32 AM

## 2020-08-17 NOTE — Progress Notes (Signed)
OT Cancellation Note  Patient Details Name: [name] MRN: [id number] DOB: [birth year]   Cancelled Treatment:    Reason Eval/Treat Not Completed: Other (comment) (Just finishing getting to.from [facility] with PT. Needing rest break. Will return as schedule allows.)  [place], OTR/L Acute Rehab Pager: [phone] Office: [phone] 08/17/2020, 11:27 AM

## 2020-08-18 DIAGNOSIS — I509 Heart failure, unspecified: Secondary | ICD-10-CM | POA: Diagnosis not present

## 2020-08-18 DIAGNOSIS — J9601 Acute respiratory failure with hypoxia: Secondary | ICD-10-CM | POA: Diagnosis not present

## 2020-08-18 DIAGNOSIS — J9602 Acute respiratory failure with hypercapnia: Secondary | ICD-10-CM | POA: Diagnosis not present

## 2020-08-18 LAB — GLUCOSE, CAPILLARY
Glucose-Capillary: 153 mg/dL — ABNORMAL HIGH (ref 70–99)
Glucose-Capillary: 167 mg/dL — ABNORMAL HIGH (ref 70–99)
Glucose-Capillary: 169 mg/dL — ABNORMAL HIGH (ref 70–99)
Glucose-Capillary: 175 mg/dL — ABNORMAL HIGH (ref 70–99)
Glucose-Capillary: 184 mg/dL — ABNORMAL HIGH (ref 70–99)
Glucose-Capillary: 199 mg/dL — ABNORMAL HIGH (ref 70–99)

## 2020-08-18 NOTE — Care Management Important Message (Signed)
Important Message  Patient Details IM Letter placed in Patients door [place]. Name: [name] MRN: [id number] Date of Birth: 1946/07/25   Medicare Important Message Given:  Yes     Kerin Salen 08/18/2020, 2:18 PM

## 2020-08-18 NOTE — Progress Notes (Signed)
PROGRESS NOTE  [name]:[id number] DOB: [birth year] DOA: 08/10/2020 PCP: Freada Bergeron, MD   LOS: 8 days   Brief Narrative / Interim history: 75 year old female with a history of diabetes, hypertension, systolic CHF, came to the hospital with shortness of breath.  She was initially admitted with Covid, on 1/3, started on remdesivir, was allergic to Decadron, and eventually improved to be sent home on 1/6.  She is supposed to be on isolation until 1/24.  During her prior hospital stay she was found to have acute systolic CHF with an EF of 20-25%, cardiology consulted and recommended further outpatient follow-up with coronary cath versus CTA and was started on Coreg.  Entresto and spironolactone were planned to be started as an outpatient. She returned on 1/10 with respiratory failure thought to be due to pulmonary edema, failed BiPAP and eventually was intubated in the ED and admitted to the ICU.Marland Kitchen  She was eventually extubated on 1/15 and transferred to the hospitalist service.  Repeat echo again shows EF 20-35%  Subjective / 24h Interval events: Feeling a little bit better, no chest pain, shortness of breath is improved  Assessment & Plan: Principal Problem Acute hypoxic respiratory failure due to pulmonary edema/concern for superinfection bacterial pneumonia -Status post 8 days of antibiotics, completing tomorrow -Wean off oxygen as tolerated, currently on 5 L.  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 Recheck BNP in the morning  Acute metabolic encephalopathy -Better, overall improved  Type 2 diabetes mellitus -Hemoglobin A1c was 6.9, continue insulin regimen  Gout -Continue home medications  History of restless leg  syndrome -Continue Requip  PT recommends SNF which may be a good idea given bounce back.  TOC consulted  Scheduled Meds: . (feeding supplement) PROSource Plus  30 mL Oral BID BM  . allopurinol  100 mg Oral Daily  . amLODipine  5 mg Oral Daily  . aspirin EC  81 mg Oral Daily  . carvedilol  3.125 mg Oral BID WC  . chlorhexidine gluconate (MEDLINE KIT)  15 mL Mouth Rinse BID  . Chlorhexidine Gluconate Cloth  6 each Topical Daily  . cholecalciferol  1,000 Units Oral Daily  . enoxaparin (LOVENOX) injection  40 mg Subcutaneous Q24H  . feeding supplement  237 mL Oral BID BM  . furosemide  40 mg Intravenous Q12H  . insulin aspart  0-15 Units Subcutaneous TID WC  . mouth rinse  15 mL Mouth Rinse q12n4p  . multivitamin with minerals  1 tablet Oral Daily  . pregabalin  100 mg Oral BID  . rOPINIRole  2 mg Oral QHS   Continuous Infusions: . sodium chloride 250 mL (08/11/20 1958)  . meropenem (MERREM) IV 1 g (08/18/20 1307)   PRN Meds:.acetaminophen, acetaminophen, docusate sodium, labetalol, lip balm, polyethylene glycol  Diet Orders (From admission, onward)    Start     Ordered   08/17/20 1555  DIET DYS 3 Room service appropriate? Yes; Fluid consistency: Thin  Diet effective now       Question Answer Comment  Room service appropriate? Yes   Fluid consistency: Thin      08/17/20 1556          DVT prophylaxis: enoxaparin (LOVENOX) injection 40 mg Start: 08/11/20 2000     Code Status: Full Code  Family Communication: d/w patient  Status is: Inpatient  Remains inpatient appropriate because:Inpatient level of care appropriate due to severity of illness   Dispo: The patient is from: Home              Anticipated d/c is to: SNF              Anticipated d/c date is: 3 days              Patient currently is not medically stable to d/c.  Consultants:  PCCM  Procedures:  2D echo  Microbiology  none  Antimicrobials: Meropenem    Objective: Vitals:   08/18/20 0049  08/18/20 0449 08/18/20 0500 08/18/20 0854  BP: (!) 110/57 (!) 112/51  (!) 114/57  Pulse: 78 78  79  Resp: (!) 21 (!) 22  20  Temp: 98.2 F (36.8 C) 98.1 F (36.7 C)  97.7 F (36.5 C)  TempSrc: Oral Oral  Oral  SpO2: 99% 100%  100%  Weight:   88.6 kg   Height:        Intake/Output Summary (Last 24 hours) at 08/18/2020 1316 Last data filed at 08/18/2020 1300 Gross per 24 hour  Intake 1268 ml  Output 2000 ml  Net -732 ml   Filed Weights   08/15/20 0500 08/17/20 0426 08/18/20 0500  Weight: 87.8 kg 86.6 kg 88.6 kg    Examination:  Constitutional: NAD Eyes: no scleral icterus ENMT: Mucous membranes are moist.  Neck: normal, supple Respiratory: clear to auscultation bilaterally, no wheezing, no crackles.  Cardiovascular: Regular rate and rhythm, no murmurs / rubs / gallops. No LE edema. Abdomen: non distended, no tenderness. Bowel sounds positive.  Musculoskeletal: no clubbing / cyanosis.  Skin: no rashes Neurologic: CN 2-12 grossly intact. Strength 5/5 in all 4.   Data Reviewed: I have independently reviewed following labs and imaging studies   CBC: Recent Labs  Lab 08/11/20 1907 08/12/20 0032 08/14/20 0246 08/15/20 0001 08/16/20 0247  WBC 16.7* 15.7* 4.6 5.3 5.0  HGB 12.7 12.5 11.1* 12.0 12.7  HCT 39.7 37.8 34.1* 37.5 38.8  MCV 91.3 89.8 91.2 92.1 90.4  PLT 264 257 165 190 591   Basic Metabolic Panel: Recent Labs  Lab 08/11/20 1907 08/12/20 0032 08/13/20 0331 08/13/20 1430 08/14/20 0246 08/15/20 0204 08/16/20 0247 08/16/20 1045 08/17/20 0114  NA 138 136  --  140 139 148* 148*  --  142  K 3.7 3.3*  --  3.6 3.5 3.4* 3.5 4.0 3.5  CL 103 101  --  106 109 110 108  --  102  CO2 21* 20*  --  22 19* 23 26  --  26  GLUCOSE 246* 215*  --  235* 339* 180* 160*  --  133*  BUN 31* 30*  --  48* 45* 40* 47*  --  52*  CREATININE 1.64* 1.54*  --  1.78* 1.34* 1.27* 1.20*  --  1.15*  CALCIUM 8.3* 8.1*  --  8.3* 8.3* 9.3 9.3  --  8.9  MG 1.6* 1.7  --  2.6*  --  1.8 2.1   --   --   PHOS 3.8 3.2 2.9  --  2.9 3.3  --   --   --    Liver Function Tests: Recent Labs  Lab 08/11/20 1907 08/12/20 0032 08/14/20 0246 08/15/20 0204  AST 57* 66* 61* 68*  ALT 62* 60* 46* 50*  ALKPHOS 128* 124 114 99  BILITOT 0.7 0.7 0.6 1.2  PROT  5.7* 5.7* 5.7* 6.5  ALBUMIN 2.9* 2.9* 2.7* 2.6*   Coagulation Profile: No results for input(s): INR, PROTIME in the last 168 hours. HbA1C: No results for input(s): HGBA1C in the last 72 hours. CBG: Recent Labs  Lab 08/17/20 2051 08/17/20 2343 08/18/20 0400 08/18/20 0741 08/18/20 1152  GLUCAP 252* 141* 169* 153* 184*    Recent Results (from the past 240 hour(s))  MRSA PCR Screening     Status: None   Collection Time: 08/11/20  4:27 PM   Specimen: Nasal Mucosa; Nasopharyngeal  Result Value Ref Range Status   MRSA by PCR NEGATIVE NEGATIVE Final    Comment:        The GeneXpert MRSA Assay (FDA approved for NASAL specimens only), is one component of a comprehensive MRSA colonization surveillance program. It is not intended to diagnose MRSA infection nor to guide or monitor treatment for MRSA infections. Performed at [facility], [place] [address], [place], [place] [address]   Culture, respiratory (non-expectorated)     Status: None   Collection Time: 08/11/20  6:13 PM   Specimen: Tracheal Aspirate; Respiratory  Result Value Ref Range Status   Specimen Description   Final    TRACHEAL ASPIRATE Performed at [facility] [address] [place], [place] [address]    Special Requests   Final    NONE Performed at [facility], [place] [address], [place], [place] [address]    Gram Stain   Final    ABUNDANT WBC PRESENT, PREDOMINANTLY PMN RARE GRAM POSITIVE COCCI    Culture   Final    RARE Consistent with normal respiratory flora. NO STAPHYLOCOCCUS AUREUS ISOLATED Performed at [facility], [place] [address], [place], [place] [address]    Report Status  08/14/2020 FINAL  Final  Culture, blood (Routine X 2) w Reflex to ID Panel     Status: None   Collection Time: 08/11/20  7:21 PM   Specimen: BLOOD  Result Value Ref Range Status   Specimen Description   Final    BLOOD RIGHT HAND Performed at [facility] [address] [place], [place] [address]    Special Requests   Final    BOTTLES DRAWN AEROBIC ONLY Blood Culture adequate volume Performed at [facility] [address] [place], [place] [address]    Culture   Final    NO GROWTH 5 DAYS Performed at [facility], [place] [address] [place], [place] [address]    Report Status 08/16/2020 FINAL  Final  Culture, blood (Routine X 2) w Reflex to ID Panel     Status: None   Collection Time: 08/11/20  7:21 PM   Specimen: BLOOD  Result Value Ref Range Status   Specimen Description   Final    BLOOD RIGHT HAND Performed at [facility] [address] [place], [place] [address]    Special Requests   Final    BOTTLES DRAWN AEROBIC ONLY Blood Culture adequate volume Performed at [facility] [address], [place], [place] [address]    Culture   Final    NO GROWTH 5 DAYS Performed at [facility], [place] [address] [place], [place] [address]    Report Status 08/16/2020 FINAL  Final     Radiology Studies: No results found.   Marzetta Board, MD, PhD [facility]  Between 7 am - 7 pm I am available, please contact me via  Amion or Securechat  Between 7 pm - 7 am I am not available, please contact night coverage MD/APP via Amion

## 2020-08-18 NOTE — TOC Progression Note (Signed)
Transition of Care [facility]) - Progression Note    Patient Details  Name: [name] MRN: [id number] Date of Birth: [birth year]  Transition of Care [facility]) CM/SW [place], [place] Phone Number: 08/18/2020, 3:29 PM  Clinical Narrative:  Patient seen in follow up to OT recommendation of SNF/24 hr supervision.  Ms Dwyer does not want to go to rehab, but she is realistic enough to know that she cannot return home unless she improves while here.  States both husband and daughter are in the home round the clock, and can help her if it is one person assist.  She has DME walker, bedside commode and wheelchair, though she does not use it.  She agreed that I could send out her information to facilites as a back up plan; she is hoping to go home with the support of [place] [facility]. Bed search initiated. TOC will continue to follow during the course of hospitalization.      Expected Discharge Plan: [place] Barriers to Discharge: No Barriers Identified  Expected Discharge Plan and Services Expected Discharge Plan: [place]   Discharge Planning Services: CM Consult   Living arrangements for the past 2 months: Single Family Home                                       Social Determinants of Health (SDOH) Interventions    Readmission Risk Interventions No flowsheet data found.

## 2020-08-18 NOTE — Progress Notes (Signed)
Physical Therapy Treatment Patient Details Name: [name] MRN: [id number] DOB: [birth year] Today's Date: 08/18/2020    History of Present Illness Pt79 yo  presents to the ED 08/10/20  with sob.  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 In ED 1/10, required intubation. Extubated1/14/22.    PT Comments    Pt ambulated 8' x 2 with RW with seated rest break. SaO2 88% on 3L O2 with walking, HR 114, SaO2 100% on 3L at rest. Instructed pt in seated BLE exercises for strengthening. Pt stated she has 24* help at home and that she prefers to DC home rather than to SNF.    Follow Up Recommendations  Home health PT;Supervision for mobility/OOB;Supervision/Assistance - 24 hour     Equipment Recommendations  None recommended by PT    Recommendations for Other Services       Precautions / Restrictions Precautions Precautions: Fall Precaution Comments: monitor sats BP Restrictions Weight Bearing Restrictions: No    Mobility  Bed Mobility Overal bed mobility: Needs Assistance Bed Mobility: Sidelying to Sit   Sidelying to sit: Min assist;HOB elevated       General bed mobility comments: up in recliner  Transfers Overall transfer level: Needs assistance Equipment used: Rolling walker (2 wheeled) Transfers: Sit to/from Stand Sit to Stand: Mod assist Stand pivot transfers: Mod assist       General transfer comment: assist to rise, VCs hand placement, sit to stand x 3  Ambulation/Gait   Gait Distance (Feet): 16 Feet (8' x 2 with seated rest break) Assistive device: Rolling walker (2 wheeled) Gait Pattern/deviations: Step-to pattern;Decreased stride length;Trunk flexed Gait velocity: decr   General Gait Details: 8' x 2 with seated rest, distance limited by fatigue, VCs for posture and to step closer to frame of RW; SaO2 88% on 3L O2 with walking, HR 114, SaO2 100% on 3L O2 at rest   Stairs             Wheelchair Mobility     Modified Rankin (Stroke Patients Only)       Balance Overall balance assessment: Needs assistance Sitting-balance support: Feet supported Sitting balance-Leahy Scale: Fair Sitting balance - Comments: fair static sitting, poor dynamic with L lateral lean   Standing balance support: Single extremity supported Standing balance-Leahy Scale: Poor Standing balance comment: holds onto chair during stand pivot                            Cognition Arousal/Alertness: Awake/alert Behavior During Therapy: WFL for tasks assessed/performed Overall Cognitive Status: Within Functional Limits for tasks assessed                                        Exercises General Exercises - Lower Extremity Ankle Circles/Pumps: AROM;Both;10 reps;Seated Long Arc Quad: AROM;Both;15 reps;Seated Hip Flexion/Marching: AROM;Both;10 reps;Seated    General Comments        Pertinent Vitals/Pain Pain Assessment: No/denies pain Faces Pain Scale: Hurts little more Pain Location: buttock Pain Descriptors / Indicators: Sore Pain Intervention(s): Repositioned    Home Living Family/patient expects to be discharged to:: Private residence Living Arrangements: Spouse/significant other;Children Available Help at Discharge: Family;Available 24 hours/day Type of Home: House Home Access: Stairs to enter   Home Layout: One level Home Equipment: Environmental consultant - 2 wheels  Prior Function Level of Independence: Independent      Comments: drives   PT Goals (current goals can now be found in the care plan section) Acute Rehab PT Goals Patient Stated Goal: to go home PT Goal Formulation: With patient Time For Goal Achievement: 08/31/20 Potential to Achieve Goals: Good Progress towards PT goals: Progressing toward goals    Frequency    Min 3X/week      PT Plan Discharge plan needs to be updated    Co-evaluation              AM-PAC PT "6 Clicks" Mobility   Outcome  Measure  Help needed turning from your back to your side while in a flat bed without using bedrails?: A Lot Help needed moving from lying on your back to sitting on the side of a flat bed without using bedrails?: A Lot Help needed moving to and from a bed to a chair (including a wheelchair)?: A Lot Help needed standing up from a chair using your arms (e.g., wheelchair or bedside chair)?: A Little Help needed to walk in hospital room?: A Little Help needed climbing 3-5 steps with a railing? : A Lot 6 Click Score: 14    End of Session Equipment Utilized During Treatment: Oxygen;Gait belt Activity Tolerance: Patient tolerated treatment well Patient left: in chair;with call bell/phone within reach;with chair alarm set Nurse Communication: Mobility status PT Visit Diagnosis: Unsteadiness on feet (R26.81);Muscle weakness (generalized) (M62.81);Difficulty in walking, not elsewhere classified (R26.2)     Time: 2876-8115 PT Time Calculation (min) (ACUTE ONLY): 18 min  Charges:  $Gait Training: 8-22 mins                     Blondell Reveal Kistler PT 08/18/2020  Acute Rehabilitation Services Pager [phone] Office [phone]

## 2020-08-18 NOTE — Evaluation (Signed)
Occupational Therapy Evaluation Patient Details Name: [name] MRN: [id number] DOB: 1945/08/18 Today's Date: 08/18/2020    History of Present Illness Pt9 yo  presents to the ED 08/10/20  with sob.  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 In ED 1/10, required intubation. Extubated1/14/22.   Clinical Impression   Patient lives at home with spouse and DTR, typically I with self care. Currently patient requiring set up/supervision with UB ADL, mod to max A for LB ADL and mod A for stand pivot transfer to recliner due to weakness, decreased balance and activity tolerance. Patient O2 maintained 98-100% on 5L therefore decreased to 3L once seated in chair maintaining 95-96%. Recommend continued acute OT services to maximize patient safety and independence with ADLs in order to facilitate D/C to venue listed below.    Follow Up Recommendations  SNF;Other (comment) (vs HH pending patient progress)    Equipment Recommendations  3 in 1 bedside commode       Precautions / Restrictions Precautions Precautions: Fall Precaution Comments: monitor sats BP Restrictions Weight Bearing Restrictions: No      Mobility Bed Mobility Overal bed mobility: Needs Assistance Bed Mobility: Sidelying to Sit   Sidelying to sit: Min assist;HOB elevated       General bed mobility comments: increased time and min A for trunk support    Transfers Overall transfer level: Needs assistance Equipment used: None Transfers: Stand Pivot Transfers   Stand pivot transfers: Mod assist       General transfer comment: please see toilet transfer in ADL section    Balance Overall balance assessment: Needs assistance Sitting-balance support: Feet supported Sitting balance-Leahy Scale: Fair Sitting balance - Comments: fair static sitting, poor dynamic with L lateral lean   Standing balance support: Single extremity supported Standing balance-Leahy Scale:  Poor Standing balance comment: holds onto chair during stand pivot                           ADL either performed or assessed with clinical judgement   ADL Overall ADL's : Needs assistance/impaired Eating/Feeding: Independent;Sitting   Grooming: Set up;Oral care;Wash/dry face;Sitting   Upper Body Bathing: Set up;Sitting   Lower Body Bathing: Moderate assistance;Sit to/from stand;Sitting/lateral leans   Upper Body Dressing : Set up;Sitting   Lower Body Dressing: Moderate assistance;Sitting/lateral leans;Sit to/from stand Lower Body Dressing Details (indicate cue type and reason): seated EOB patient with multiple L lateral loss of balance while donning socks requiring min/mod A for sitting balance Toilet Transfer: Moderate assistance;Stand-pivot;BSC Toilet Transfer Details (indicate cue type and reason): to recliner, mod A for stability as patient is unsteady with decreased LE strength Toileting- Clothing Manipulation and Hygiene: Maximal assistance;Sit to/from stand       Functional mobility during ADLs: Moderate assistance;Cueing for safety General ADL Comments: patient requiring increased assistance with self care due to weakness, decreased activity tolerance, balance. patient sats at 98-100% on 5L, at end of session decreased to 3L maintaining 95%                  Pertinent Vitals/Pain Pain Assessment: Faces Faces Pain Scale: Hurts little more Pain Location: buttock Pain Descriptors / Indicators: Sore Pain Intervention(s): Repositioned     Hand Dominance Right   Extremity/Trunk Assessment Upper Extremity Assessment Upper Extremity Assessment: Generalized weakness   Lower Extremity Assessment Lower Extremity Assessment: Defer to PT evaluation       Communication  Communication Communication: No difficulties   Cognition Arousal/Alertness: Awake/alert Behavior During Therapy: WFL for tasks assessed/performed Overall Cognitive Status: Within  Functional Limits for tasks assessed                                                Home Living Family/patient expects to be discharged to:: Private residence Living Arrangements: Spouse/significant other;Children Available Help at Discharge: Family;Available 24 hours/day Type of Home: House Home Access: Stairs to enter [employer] of Steps: 1   Home Layout: One level     Bathroom Shower/Tub: Walk-in shower         Home Equipment: Environmental consultant - 2 wheels          Prior Functioning/Environment Level of Independence: Independent        Comments: drives        OT Problem List: Decreased strength;Decreased activity tolerance;Impaired balance (sitting and/or standing);Decreased safety awareness;Cardiopulmonary status limiting activity;Obesity      OT Treatment/Interventions: Self-care/ADL training;Therapeutic exercise;Energy conservation;DME and/or AE instruction;Therapeutic activities;Patient/family education;Balance training    OT Goals(Current goals can be found in the care plan section) Acute Rehab OT Goals Patient Stated Goal: to go home OT Goal Formulation: With patient Time For Goal Achievement: 09/01/20 Potential to Achieve Goals: Good  OT Frequency: Min 2X/week    AM-PAC OT "6 Clicks" Daily Activity     Outcome Measure Help from another person eating meals?: None Help from another person taking care of personal grooming?: A Little Help from another person toileting, which includes using toliet, bedpan, or urinal?: A Lot Help from another person bathing (including washing, rinsing, drying)?: A Lot Help from another person to put on and taking off regular upper body clothing?: A Little Help from another person to put on and taking off regular lower body clothing?: A Lot 6 Click Score: 16   End of Session Equipment Utilized During Treatment: Oxygen Nurse Communication: Mobility status;Other (comment) (O2)  Activity Tolerance:  Patient tolerated treatment well Patient left: in chair;with call bell/phone within reach;with chair alarm set  OT Visit Diagnosis: Unsteadiness on feet (R26.81);Other abnormalities of gait and mobility (R26.89);Muscle weakness (generalized) (M62.81)                Time: 7824-2353 OT Time Calculation (min): 23 min Charges:  OT General Charges $OT Visit: 1 Visit OT Evaluation $OT Eval Moderate Complexity: 1 Mod OT Treatments $Self Care/Home Management : 8-22 mins  Delbert Phenix OT OT pager: [phone]  Rosemary Holms 08/18/2020, 12:31 PM

## 2020-08-19 DIAGNOSIS — J9601 Acute respiratory failure with hypoxia: Secondary | ICD-10-CM | POA: Diagnosis not present

## 2020-08-19 DIAGNOSIS — I509 Heart failure, unspecified: Secondary | ICD-10-CM | POA: Diagnosis not present

## 2020-08-19 DIAGNOSIS — J9602 Acute respiratory failure with hypercapnia: Secondary | ICD-10-CM | POA: Diagnosis not present

## 2020-08-19 LAB — CBC
HCT: 37.9 % (ref 36.0–46.0)
Hemoglobin: 12.4 g/dL (ref 12.0–15.0)
MCH: 29 pg (ref 26.0–34.0)
MCHC: 32.7 g/dL (ref 30.0–36.0)
MCV: 88.8 fL (ref 80.0–100.0)
Platelets: 256 10*3/uL (ref 150–400)
RBC: 4.27 MIL/uL (ref 3.87–5.11)
RDW: 13.5 % (ref 11.5–15.5)
WBC: 5.4 10*3/uL (ref 4.0–10.5)
nRBC: 0 % (ref 0.0–0.2)

## 2020-08-19 LAB — COMPREHENSIVE METABOLIC PANEL
ALT: 53 U/L — ABNORMAL HIGH (ref 0–44)
AST: 37 U/L (ref 15–41)
Albumin: 2.8 g/dL — ABNORMAL LOW (ref 3.5–5.0)
Alkaline Phosphatase: 88 U/L (ref 38–126)
Anion gap: 15 (ref 5–15)
BUN: 58 mg/dL — ABNORMAL HIGH (ref 8–23)
CO2: 26 mmol/L (ref 22–32)
Calcium: 8.8 mg/dL — ABNORMAL LOW (ref 8.9–10.3)
Chloride: 99 mmol/L (ref 98–111)
Creatinine, Ser: 1.14 mg/dL — ABNORMAL HIGH (ref 0.44–1.00)
GFR, Estimated: 51 mL/min — ABNORMAL LOW (ref >60)
Glucose, Bld: 178 mg/dL — ABNORMAL HIGH (ref 70–99)
Potassium: 3.5 mmol/L (ref 3.5–5.1)
Sodium: 140 mmol/L (ref 135–145)
Total Bilirubin: 0.8 mg/dL (ref 0.3–1.2)
Total Protein: 6.3 g/dL — ABNORMAL LOW (ref 6.5–8.1)

## 2020-08-19 LAB — GLUCOSE, CAPILLARY
Glucose-Capillary: 145 mg/dL — ABNORMAL HIGH (ref 70–99)
Glucose-Capillary: 168 mg/dL — ABNORMAL HIGH (ref 70–99)
Glucose-Capillary: 185 mg/dL — ABNORMAL HIGH (ref 70–99)
Glucose-Capillary: 194 mg/dL — ABNORMAL HIGH (ref 70–99)
Glucose-Capillary: 195 mg/dL — ABNORMAL HIGH (ref 70–99)
Glucose-Capillary: 195 mg/dL — ABNORMAL HIGH (ref 70–99)

## 2020-08-19 LAB — BRAIN NATRIURETIC PEPTIDE: B Natriuretic Peptide: 146.3 pg/mL — ABNORMAL HIGH (ref 0.0–100.0)

## 2020-08-19 MED ORDER — FUROSEMIDE 40 MG PO TABS
40.0000 mg | ORAL_TABLET | Freq: Every day | ORAL | Status: DC
  Administered 2020-08-19 – 2020-08-21 (×3): 40 mg via ORAL
  Filled 2020-08-19 (×3): qty 1

## 2020-08-19 NOTE — Progress Notes (Signed)
Patient yellow mews, respirations 21. Patient was looking for her TV remote. Respirations rechecked and are 19. MEWS protocol was not initiated. Will continue to monitor the patient.

## 2020-08-19 NOTE — Progress Notes (Signed)
PROGRESS NOTE  [name] NAT:[id number] DOB: Dec 19, 1945 DOA: 08/10/2020 PCP: Freada Bergeron, MD   LOS: 9 days   Brief Narrative / Interim history: 75 year old female with a history of diabetes, hypertension, systolic CHF, came to the hospital with shortness of breath.  She was initially admitted with Covid, on 1/3, started on remdesivir, was allergic to Decadron, and eventually improved to be sent home on 1/6.  She is supposed to be on isolation until 1/24.  During her prior hospital stay she was found to have acute systolic CHF with an EF of 20-25%, cardiology consulted and recommended further outpatient follow-up with coronary cath versus CTA and was started on Coreg.  Entresto and spironolactone were planned to be started as an outpatient. She returned on 1/10 with respiratory failure thought to be due to pulmonary edema, failed BiPAP and eventually was intubated in the ED and admitted to the ICU.Marland Kitchen  She was eventually extubated on 1/15 and transferred to the hospitalist service.  Repeat echo again shows EF 20-35%  Subjective / 24h Interval events: Better, appreciated improvement   Assessment & Plan: Principal Problem Acute hypoxic respiratory failure due to pulmonary edema/concern for superinfection bacterial pneumonia -Status post 8 days of antibiotics -Wean off oxygen as tolerated, currently on 2 L. 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 Recheck BNP in the morning  Acute metabolic encephalopathy -Better, overall improved  Type 2 diabetes mellitus -Hemoglobin A1c was 6.9, continue insulin regimen  Gout -Continue home medications  History of restless leg  syndrome -Continue Requip   Scheduled Meds: . (feeding supplement) PROSource Plus  30 mL Oral BID BM  . allopurinol  100 mg Oral Daily  . amLODipine  5 mg Oral Daily  . aspirin EC  81 mg Oral Daily  . carvedilol  3.125 mg Oral BID WC  . chlorhexidine gluconate (MEDLINE KIT)  15 mL Mouth Rinse BID  . Chlorhexidine Gluconate Cloth  6 each Topical Daily  . cholecalciferol  1,000 Units Oral Daily  . enoxaparin (LOVENOX) injection  40 mg Subcutaneous Q24H  . feeding supplement  237 mL Oral BID BM  . furosemide  40 mg Intravenous Q12H  . insulin aspart  0-15 Units Subcutaneous TID WC  . mouth rinse  15 mL Mouth Rinse q12n4p  . multivitamin with minerals  1 tablet Oral Daily  . pregabalin  100 mg Oral BID  . rOPINIRole  2 mg Oral QHS   Continuous Infusions: . sodium chloride 250 mL (08/11/20 1958)   PRN Meds:.acetaminophen, acetaminophen, docusate sodium, labetalol, lip balm, polyethylene glycol  Diet Orders (From admission, onward)    Start     Ordered   08/17/20 1555  DIET DYS 3 Room service appropriate? Yes; Fluid consistency: Thin  Diet effective now       Question Answer Comment  Room service appropriate? Yes   Fluid consistency: Thin      08/17/20 1556          DVT prophylaxis: enoxaparin (LOVENOX) injection 40 mg Start: 08/11/20 2000     Code Status: Full Code  Family Communication: d/w patient  Status is: Inpatient  Remains inpatient appropriate because:Inpatient level of care appropriate due to severity of  illness  Dispo: The patient is from: Home              Anticipated d/c is to: SNF              Anticipated d/c date is: 3 days              Patient currently is not medically stable to d/c.  Consultants:  PCCM  Procedures:  2D echo  Microbiology  none  Antimicrobials: Meropenem    Objective: Vitals:   08/18/20 2115 08/19/20 0455 08/19/20 0500 08/19/20 1130  BP:  (!) 123/58    Pulse:  83    Resp: 19 19    Temp:  98.2 F (36.8 C)     TempSrc:  Oral    SpO2:  99%  97%  Weight:   90.5 kg   Height:        Intake/Output Summary (Last 24 hours) at 08/19/2020 1256 Last data filed at 08/19/2020 1216 Gross per 24 hour  Intake 2831.33 ml  Output 2850 ml  Net -18.67 ml   Filed Weights   08/17/20 0426 08/18/20 0500 08/19/20 0500  Weight: 86.6 kg 88.6 kg 90.5 kg    Examination:  Constitutional: nad Eyes: no icterus  ENMT: mmm Neck: normal, supple Respiratory: cta biL, no wheezing, no crackles  Cardiovascular: rrr, no mrg Abdomen: soft, nt, nd, bs+  Musculoskeletal: no clubbing / cyanosis.  Skin: no rashes Neurologic: non focal   Data Reviewed: I have independently reviewed following labs and imaging studies   CBC: Recent Labs  Lab 08/14/20 0246 08/15/20 0001 08/16/20 0247 08/19/20 0348  WBC 4.6 5.3 5.0 5.4  HGB 11.1* 12.0 12.7 12.4  HCT 34.1* 37.5 38.8 37.9  MCV 91.2 92.1 90.4 88.8  PLT 165 190 233 256   Basic Metabolic Panel: Recent Labs  Lab 08/13/20 0331 08/13/20 1430 08/13/20 1430 08/14/20 0246 08/15/20 0204 08/16/20 0247 08/16/20 1045 08/17/20 0114 08/19/20 0348  NA  --  140   < > 139 148* 148*  --  142 140  K  --  3.6   < > 3.5 3.4* 3.5 4.0 3.5 3.5  CL  --  106   < > 109 110 108  --  102 99  CO2  --  22   < > 19* 23 26  --  26 26  GLUCOSE  --  235*   < > 339* 180* 160*  --  133* 178*  BUN  --  48*   < > 45* 40* 47*  --  52* 58*  CREATININE  --  1.78*   < > 1.34* 1.27* 1.20*  --  1.15* 1.14*  CALCIUM  --  8.3*   < > 8.3* 9.3 9.3  --  8.9 8.8*  MG  --  2.6*  --   --  1.8 2.1  --   --   --   PHOS 2.9  --   --  2.9 3.3  --   --   --   --    < > = values in this interval not displayed.   Liver Function Tests: Recent Labs  Lab 08/14/20 0246 08/15/20 0204 08/19/20 0348  AST 61* 68* 37  ALT 46* 50* 53*  ALKPHOS 114 99 88  BILITOT 0.6 1.2 0.8  PROT 5.7* 6.5 6.3*  ALBUMIN 2.7* 2.6* 2.8*   Coagulation Profile: No results for input(s): INR, PROTIME in the last 168  hours. HbA1C: No results for input(s):   HGBA1C in the last 72 hours. CBG: Recent Labs  Lab 08/18/20 1950 08/18/20 2344 08/19/20 0456 08/19/20 0756 08/19/20 1142  GLUCAP 199* 175* 194* 168* 145*    Recent Results (from the past 240 hour(s))  MRSA PCR Screening     Status: None   Collection Time: 08/11/20  4:27 PM   Specimen: Nasal Mucosa; Nasopharyngeal  Result Value Ref Range Status   MRSA by PCR NEGATIVE NEGATIVE Final    Comment:        The GeneXpert MRSA Assay (FDA approved for NASAL specimens only), is one component of a comprehensive MRSA colonization surveillance program. It is not intended to diagnose MRSA infection nor to guide or monitor treatment for MRSA infections. Performed at [facility], [place] [address] [place], [place] [address]   Culture, respiratory (non-expectorated)     Status: None   Collection Time: 08/11/20  6:13 PM   Specimen: Tracheal Aspirate; Respiratory  Result Value Ref Range Status   Specimen Description   Final    TRACHEAL ASPIRATE Performed at [facility] [address], [place], [place] [address]    Special Requests   Final    NONE Performed at [facility], [place] [address] [place], [place] [address]    Gram Stain   Final    ABUNDANT WBC PRESENT, PREDOMINANTLY PMN RARE GRAM POSITIVE COCCI    Culture   Final    RARE Consistent with normal respiratory flora. NO STAPHYLOCOCCUS AUREUS ISOLATED Performed at [facility], [place] [address], [place], [place] [address]    Report Status 08/14/2020 FINAL  Final  Culture, blood (Routine X 2) w Reflex to ID Panel     Status: None   Collection Time: 08/11/20  7:21 PM   Specimen: BLOOD  Result Value Ref Range Status   Specimen Description   Final    BLOOD RIGHT HAND Performed at [facility] [address] [place], [place] [address]    Special Requests   Final    BOTTLES DRAWN AEROBIC ONLY Blood Culture  adequate volume Performed at [facility] [address] [place], [place] [address]    Culture   Final    NO GROWTH 5 DAYS Performed at [facility], [place] [address], [place], [place] [address]    Report Status 08/16/2020 FINAL  Final  Culture, blood (Routine X 2) w Reflex to ID Panel     Status: None   Collection Time: 08/11/20  7:21 PM   Specimen: BLOOD  Result Value Ref Range Status   Specimen Description   Final    BLOOD RIGHT HAND Performed at [facility] [address] [place], [place] [address]    Special Requests   Final    BOTTLES DRAWN AEROBIC ONLY Blood Culture adequate volume Performed at [facility] [address], [place], [place] [address]    Culture   Final    NO GROWTH 5 DAYS Performed at [facility], [place] [address] [place], [place] [address]    Report Status 08/16/2020 FINAL  Final     Radiology Studies: No results found.   Marzetta Board, MD, PhD [facility]  Between 7 am - 7 pm I am available, please contact me via Amion or Securechat  Between 7 pm - 7 am I am not available, please contact night coverage MD/APP via Amion

## 2020-08-19 NOTE — Progress Notes (Signed)
Physical Therapy Treatment Patient Details Name: [name] MRN: [id number] DOB: 1946/04/07 Today's Date: 08/19/2020    History of Present Illness Pt54 yo  presents to the ED 08/10/20  with sob.  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 In ED 1/10, required intubation. Extubated1/14/22.    PT Comments    Pt is progressing well with mobility, she tolerated increased ambulation distance of 77' + 25' with seated rest, with RW. She had no loss of balance, SaO2 95% on room air walking, no dyspnea. Assisted pt with set up for a sponge bath. She was able to perform the bathing independently while sitting at edge of bed.   Follow Up Recommendations  Home health PT;Supervision for mobility/OOB     Equipment Recommendations  None recommended by PT    Recommendations for Other Services       Precautions / Restrictions Precautions Precautions: Fall Restrictions Weight Bearing Restrictions: No    Mobility  Bed Mobility               General bed mobility comments: up at edge of bed  Transfers Overall transfer level: Needs assistance Equipment used: Rolling walker (2 wheeled) Transfers: Sit to/from Stand Sit to Stand: Min guard         General transfer comment: VCs hand placement  Ambulation/Gait Ambulation/Gait assistance: Supervision Gait Distance (Feet): 50 Feet Assistive device: Rolling walker (2 wheeled) Gait Pattern/deviations: Step-through pattern;Decreased stride length Gait velocity: decr   General Gait Details: 73' + 25' with seated rest, no dyspnea, SaO2 95% on room air while walking, no loss of balance   Stairs             Wheelchair Mobility    Modified Rankin (Stroke Patients Only)       Balance Overall balance assessment: Needs assistance Sitting-balance support: Feet supported Sitting balance-Leahy Scale: Good Sitting balance - Comments: fair static sitting, poor dynamic with L lateral  lean     Standing balance-Leahy Scale: Fair                              Cognition Arousal/Alertness: Awake/alert Behavior During Therapy: WFL for tasks assessed/performed Overall Cognitive Status: Within Functional Limits for tasks assessed                                        Exercises General Exercises - Lower Extremity Long Arc Quad: AROM;Both;15 reps;Seated Hip Flexion/Marching: AROM;Both;Seated;20 reps Shoulder Exercises Shoulder Flexion: AROM;Both;10 reps;Seated    General Comments        Pertinent Vitals/Pain Pain Assessment: No/denies pain    Home Living                      Prior Function            PT Goals (current goals can now be found in the care plan section) Acute Rehab PT Goals Patient Stated Goal: to go home, 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 [employer] PT Goal Formulation: With patient Time For Goal Achievement: 08/31/20 Potential to Achieve Goals: Good Progress towards PT goals: Progressing toward goals;Goals met and updated - see care plan    Frequency    Min 3X/week      PT Plan Current plan remains appropriate    Co-evaluation  AM-PAC PT "6 Clicks" Mobility   Outcome Measure  Help needed turning from your back to your side while in a flat bed without using bedrails?: A Little Help needed moving from lying on your back to sitting on the side of a flat bed without using bedrails?: A Little Help needed moving to and from a bed to a chair (including a wheelchair)?: A Little Help needed standing up from a chair using your arms (e.g., wheelchair or bedside chair)?: A Little Help needed to walk in hospital room?: A Little Help needed climbing 3-5 steps with a railing? : A Little 6 Click Score: 18    End of Session Equipment Utilized During Treatment: Gait belt Activity Tolerance: Patient tolerated treatment well Patient left: with call bell/phone within reach;in bed;with  nursing/sitter in room Nurse Communication: Mobility status PT Visit Diagnosis: Unsteadiness on feet (R26.81);Muscle weakness (generalized) (M62.81);Difficulty in walking, not elsewhere classified (R26.2)     Time: 8101-7510 PT Time Calculation (min) (ACUTE ONLY): 27 min  Charges:  $Gait Training: 8-22 mins $Therapeutic Exercise: 8-22 mins                     Blondell Reveal Kistler PT 08/19/2020  Acute Rehabilitation Services Pager [phone] Office [phone]

## 2020-08-19 NOTE — NC FL2 (Signed)
[place] MEDICAID FL2 LEVEL OF CARE SCREENING TOOL     IDENTIFICATION  Patient Name: [name] Birthdate: [birth year] Sex: female Admission Date (Current Location): 08/10/2020  [facility] and [place] Number:  Herbalist and Address:  [facility],  [facility] [place] [address] [place]      Provider Number: [id number]  Attending Physician Name and Address:  Caren Griffins, MD  Relative Name and Phone Number:  Regena, Delucchi [facility])   [phone]    Current Level of Care: Hospital Recommended Level of Care: [place] Prior Approval Number:    Date Approved/Denied:   PASRR Number: [id number]  Discharge Plan: SNF    Current Diagnoses: Patient Active Problem List   Diagnosis Date Noted  . Pressure injury of skin 08/14/2020  . Acute respiratory failure with hypoxia and hypercarbia ([place]) 08/11/2020  . CHF exacerbation ([place]) 08/10/2020  . Pneumonia due to COVID-19 virus 08/03/2020  . GERD (gastroesophageal reflux disease) 05/25/2011  . Hemorrhoids, internal, with bleeding 05/25/2011  . Obesity 05/25/2011    Orientation RESPIRATION BLADDER Height & Weight     Self,Time,Situation,Place  Normal Indwelling catheter Weight: 90.5 kg Height:  5' 5"  (165.1 cm)  BEHAVIORAL SYMPTOMS/MOOD NEUROLOGICAL BOWEL NUTRITION STATUS   (none)  (none) Continent Diet (see d/c summary)  AMBULATORY STATUS COMMUNICATION OF NEEDS Skin   Extensive Assist Verbally Other (Comment) (Pressure Injury, R Buttocks)                       Personal Care Assistance Level of Assistance  Bathing,Feeding,Dressing Bathing Assistance: Maximum assistance Feeding assistance: Independent Dressing Assistance: Limited assistance     Functional Limitations Info  Sight,Hearing,Speech Sight Info: Adequate Hearing Info: Adequate Speech Info: Adequate    SPECIAL CARE FACTORS FREQUENCY  PT (By licensed PT),OT (By licensed OT)     PT Frequency: 5X/W OT Frequency:  5X/W            Contractures Contractures Info: Not present    Additional Factors Info  Code Status,Allergies Code Status Info: Full Allergies Info: Amlodipine   Depo-medrol    Gabapentin   Levothyroxine   Simvastatin   Sitagliptin   Ace Inhibitors   Betadine   Codeine   Cortizone-10    Tetracyclines & Related   Dexamethasone           Current Medications (08/19/2020):  This is the current hospital active medication list Current Facility-Administered Medications  Medication Dose Route Frequency Provider Last Rate Last Admin  . (feeding supplement) PROSource Plus liquid 30 mL  30 mL Oral BID BM Nita Sells, MD   30 mL at 08/18/20 1305  . 0.9 %  sodium chloride infusion  250 mL Intravenous Continuous Chesley Mires, MD 10 mL/hr at 08/11/20 1958 250 mL at 08/11/20 1958  . acetaminophen (TYLENOL) suppository 650 mg  650 mg Rectal Q4H PRN Chesley Mires, MD      . acetaminophen (TYLENOL) tablet 650 mg  650 mg Oral Q6H PRN Chesley Mires, MD      . allopurinol (ZYLOPRIM) tablet 100 mg  100 mg Oral Daily Chesley Mires, MD   100 mg at 08/19/20 1121  . amLODipine (NORVASC) tablet 5 mg  5 mg Oral Daily Nita Sells, MD   5 mg at 08/19/20 1120  . aspirin EC tablet 81 mg  81 mg Oral Daily Chesley Mires, MD   81 mg at 08/19/20 1121  . carvedilol (COREG) tablet 3.125 mg  3.125 mg Oral  BID WC Chesley Mires, MD   3.125 mg at 08/19/20 0847  . chlorhexidine gluconate (MEDLINE KIT) (PERIDEX) 0.12 % solution 15 mL  15 mL Mouth Rinse BID Chesley Mires, MD   15 mL at 08/19/20 0847  . Chlorhexidine Gluconate Cloth 2 % PADS 6 each  6 each Topical Daily Chesley Mires, MD   6 each at 08/19/20 1122  . cholecalciferol (VITAMIN D3) tablet 1,000 Units  1,000 Units Oral Daily Chesley Mires, MD   1,000 Units at 08/19/20 1119  . docusate sodium (COLACE) capsule 100 mg  100 mg Oral Daily PRN Chesley Mires, MD      . enoxaparin (LOVENOX) injection 40 mg  40 mg Subcutaneous Q24H Chesley Mires, MD   40 mg at 08/18/20  2121  . feeding supplement (ENSURE ENLIVE / ENSURE PLUS) liquid 237 mL  237 mL Oral BID BM Nita Sells, MD   237 mL at 08/18/20 1553  . furosemide (LASIX) tablet 40 mg  40 mg Oral Daily Gherghe, Costin M, MD      . insulin aspart (novoLOG) injection 0-15 Units  0-15 Units Subcutaneous TID WC Nita Sells, MD   2 Units at 08/19/20 1251  . labetalol (NORMODYNE) injection 10-20 mg  10-20 mg Intravenous Q2H PRN Chesley Mires, MD   10 mg at 08/15/20 1404  . lip balm (CARMEX) ointment   Topical PRN Chesley Mires, MD   Given at 08/16/20 0003  . MEDLINE mouth rinse  15 mL Mouth Rinse q12n4p Chesley Mires, MD   15 mL at 08/19/20 1252  . multivitamin with minerals tablet 1 tablet  1 tablet Oral Daily Nita Sells, MD   1 tablet at 08/19/20 1119  . polyethylene glycol (MIRALAX / GLYCOLAX) packet 17 g  17 g Oral Daily PRN Chesley Mires, MD      . pregabalin (LYRICA) capsule 100 mg  100 mg Oral BID Chesley Mires, MD   100 mg at 08/19/20 1118  . rOPINIRole (REQUIP) tablet 2 mg  2 mg Oral QHS Nita Sells, MD   2 mg at 08/18/20 2121     Discharge Medications: Please see discharge summary for a list of discharge medications.  Relevant Imaging Results:  Relevant Lab Results:   Additional Information [place], [place]

## 2020-08-20 DIAGNOSIS — I509 Heart failure, unspecified: Secondary | ICD-10-CM | POA: Diagnosis not present

## 2020-08-20 DIAGNOSIS — J9602 Acute respiratory failure with hypercapnia: Secondary | ICD-10-CM | POA: Diagnosis not present

## 2020-08-20 DIAGNOSIS — J9601 Acute respiratory failure with hypoxia: Secondary | ICD-10-CM | POA: Diagnosis not present

## 2020-08-20 LAB — CBC
HCT: 40.6 % (ref 36.0–46.0)
Hemoglobin: 13.1 g/dL (ref 12.0–15.0)
MCH: 29.2 pg (ref 26.0–34.0)
MCHC: 32.3 g/dL (ref 30.0–36.0)
MCV: 90.6 fL (ref 80.0–100.0)
Platelets: 319 10*3/uL (ref 150–400)
RBC: 4.48 MIL/uL (ref 3.87–5.11)
RDW: 13.3 % (ref 11.5–15.5)
WBC: 7 10*3/uL (ref 4.0–10.5)
nRBC: 0 % (ref 0.0–0.2)

## 2020-08-20 LAB — COMPREHENSIVE METABOLIC PANEL
ALT: 45 U/L — ABNORMAL HIGH (ref 0–44)
AST: 32 U/L (ref 15–41)
Albumin: 3.1 g/dL — ABNORMAL LOW (ref 3.5–5.0)
Alkaline Phosphatase: 92 U/L (ref 38–126)
Anion gap: 13 (ref 5–15)
BUN: 53 mg/dL — ABNORMAL HIGH (ref 8–23)
CO2: 28 mmol/L (ref 22–32)
Calcium: 9.2 mg/dL (ref 8.9–10.3)
Chloride: 98 mmol/L (ref 98–111)
Creatinine, Ser: 1.19 mg/dL — ABNORMAL HIGH (ref 0.44–1.00)
GFR, Estimated: 48 mL/min — ABNORMAL LOW (ref >60)
Glucose, Bld: 196 mg/dL — ABNORMAL HIGH (ref 70–99)
Potassium: 3.3 mmol/L — ABNORMAL LOW (ref 3.5–5.1)
Sodium: 139 mmol/L (ref 135–145)
Total Bilirubin: 0.7 mg/dL (ref 0.3–1.2)
Total Protein: 6.7 g/dL (ref 6.5–8.1)

## 2020-08-20 LAB — GLUCOSE, CAPILLARY
Glucose-Capillary: 167 mg/dL — ABNORMAL HIGH (ref 70–99)
Glucose-Capillary: 173 mg/dL — ABNORMAL HIGH (ref 70–99)
Glucose-Capillary: 179 mg/dL — ABNORMAL HIGH (ref 70–99)
Glucose-Capillary: 189 mg/dL — ABNORMAL HIGH (ref 70–99)
Glucose-Capillary: 219 mg/dL — ABNORMAL HIGH (ref 70–99)
Glucose-Capillary: 245 mg/dL — ABNORMAL HIGH (ref 70–99)

## 2020-08-20 MED ORDER — POTASSIUM CHLORIDE CRYS ER 20 MEQ PO TBCR
40.0000 meq | EXTENDED_RELEASE_TABLET | Freq: Once | ORAL | Status: AC
  Administered 2020-08-20: 40 meq via ORAL
  Filled 2020-08-20: qty 2

## 2020-08-20 MED ORDER — COLCHICINE 0.6 MG PO TABS
0.6000 mg | ORAL_TABLET | Freq: Every day | ORAL | Status: DC
  Administered 2020-08-20 – 2020-08-21 (×2): 0.6 mg via ORAL
  Filled 2020-08-20 (×2): qty 1

## 2020-08-20 MED ORDER — GUAIFENESIN 100 MG/5ML PO SOLN
5.0000 mL | ORAL | Status: DC | PRN
  Administered 2020-08-20 – 2020-08-21 (×2): 100 mg via ORAL
  Filled 2020-08-20 (×2): qty 10

## 2020-08-20 NOTE — TOC Progression Note (Signed)
Transition of Care [facility]) - Progression Note    Patient Details  Name: [name] MRN: [id number] Date of Birth: [birth year]  Transition of Care [facility]) CM/SW [place], [place] Phone Number: 08/20/2020, 2:36 PM  Clinical Narrative:   Spoke with patient to confirm her plan to return home rather than SNF placement.  She is open to a [facility] referral.  Contacted Cindie with [facility] who agreed to pick up patient for services. TOC will continue to follow during the course of hospitalization.     Expected Discharge Plan: [place] Barriers to Discharge: No Barriers Identified  Expected Discharge Plan and Services Expected Discharge Plan: [place]   Discharge Planning Services: CM Consult   Living arrangements for the past 2 months: Single Family Home                                       Social Determinants of Health (SDOH) Interventions    Readmission Risk Interventions No flowsheet data found.

## 2020-08-20 NOTE — Progress Notes (Signed)
Occupational Therapy Treatment Patient Details Name: [name] MRN: [id number] DOB: 12-09-1945 Today's Date: 08/20/2020    History of present illness Pt28 yo  presents to the ED 08/10/20  with sob.  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 In ED 1/10, required intubation. Extubated1/14/22.   OT comments  Patient much improved with functional mobility, ambulation and transfers at supervision level with rolling walker. Patient needing increased time with sit to stand due to B foot pain 2* gout. Patient highly motivated "I would do more for you if my feet didn't hurt." Patient tolerate ambulating in room ~92ft before having to return to sitting 2* pain. Updated D/C recommendation as patient progressing well, will continue to follow.   Follow Up Recommendations  Home health OT    Equipment Recommendations  3 in 1 bedside commode       Precautions / Restrictions Precautions Precautions: Fall Precaution Comments: monitor sats BP Restrictions Weight Bearing Restrictions: No       Mobility Bed Mobility Overal bed mobility: Modified Independent                Transfers Overall transfer level: Needs assistance Equipment used: Rolling walker (2 wheeled) Transfers: Sit to/from Stand Sit to Stand: Supervision         General transfer comment: no physical assistance required, increased time to power up to standing due to B foot pain    Balance Overall balance assessment: Needs assistance Sitting-balance support: Feet supported Sitting balance-Leahy Scale: Good     Standing balance support: Bilateral upper extremity supported Standing balance-Leahy Scale: Poor Standing balance comment: reliant on RW due to foot pain needing UE support                           ADL either performed or assessed with clinical judgement   ADL Overall ADL's : Needs assistance/impaired Eating/Feeding: Independent;Sitting                        Toilet Transfer: Supervision/safety;Ambulation;RW Toilet Transfer Details (indicate cue type and reason): simulated with functional ambulation in patient's room ~10 ft with rolling walker at supervision level. patient states she would happily do more "my feet just hurt." Patient O2 reading 94% once seated EOB (poor wave form during ambulation)         Functional mobility during ADLs: Supervision/safety;Rolling walker General ADL Comments: patient much improved with overall mobility and ambulation this session, states she is feeling better other than gout pain in feet               Cognition Arousal/Alertness: Awake/alert Behavior During Therapy: WFL for tasks assessed/performed Overall Cognitive Status: Within Functional Limits for tasks assessed                                                     Pertinent Vitals/ Pain       Pain Assessment: Faces Faces Pain Scale: Hurts even more Pain Location: B feet (gout) Pain Descriptors / Indicators: Sore Pain Intervention(s): Premedicated before session      Frequency  Min 2X/week        Progress Toward Goals  OT Goals(current goals can now be found in the care plan section)  Progress  towards OT goals: Progressing toward goals  Acute Rehab OT Goals Patient Stated Goal: to go home, 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 [employer] OT Goal Formulation: With patient Time For Goal Achievement: 09/01/20 Potential to Achieve Goals: Good ADL Goals Pt Will Perform Lower Body Dressing: with modified independence;sit to/from stand Pt Will Transfer to Toilet: with modified independence;ambulating;regular height toilet (walker) Pt Will Perform Toileting - Clothing Manipulation and hygiene: with modified independence;sit to/from stand Pt/caregiver will Perform Home Exercise Program: Increased strength;Both right and left upper extremity;With theraband;Independently;With written HEP provided  Plan  Discharge plan needs to be updated       AM-PAC OT "6 Clicks" Daily Activity     Outcome Measure   Help from another person eating meals?: None Help from another person taking care of personal grooming?: A Little Help from another person toileting, which includes using toliet, bedpan, or urinal?: A Little Help from another person bathing (including washing, rinsing, drying)?: A Little Help from another person to put on and taking off regular upper body clothing?: A Little Help from another person to put on and taking off regular lower body clothing?: A Little 6 Click Score: 19    End of Session Equipment Utilized During Treatment: Rolling walker  OT Visit Diagnosis: Unsteadiness on feet (R26.81);Other abnormalities of gait and mobility (R26.89);Muscle weakness (generalized) (M62.81)   Activity Tolerance Patient limited by pain;Patient tolerated treatment well   Patient Left Other (comment) (seated EOB)   Nurse Communication Mobility status        Time: 6295-2841 OT Time Calculation (min): 11 min  Charges: OT General Charges $OT Visit: 1 Visit OT Treatments $Self Care/Home Management : 8-22 mins  Delbert Phenix OT OT pager: [place] 08/20/2020, 1:08 PM

## 2020-08-20 NOTE — Progress Notes (Signed)
PROGRESS NOTE  [name] PQZ:[id number] DOB: 08/27/1945 DOA: 08/10/2020 PCP: Freada Bergeron, MD   LOS: 10 days   Brief Narrative / Interim history: 75 year old female with a history of diabetes, hypertension, systolic CHF, came to the hospital with shortness of breath.  She was initially admitted with Covid, on 1/3, started on remdesivir, was allergic to Decadron, and eventually improved to be sent home on 1/6.  She is supposed to be on isolation until 1/24.  During her prior hospital stay she was found to have acute systolic CHF with an EF of 20-25%, cardiology consulted and recommended further outpatient follow-up with coronary cath versus CTA and was started on Coreg.  Entresto and spironolactone were planned to be started as an outpatient. She returned on 1/10 with respiratory failure thought to be due to pulmonary edema, failed BiPAP and eventually was intubated in the ED and admitted to the ICU.Marland Kitchen  She was eventually extubated on 1/15 and transferred to the hospitalist service.  Repeat echo again shows EF 20-35%  Subjective / 24h Interval events: Breathing better, has more energy however has developed of right great toe pain, thinks it is a gouty attack which she has had in the past  Assessment & Plan: Principal Problem Acute hypoxic respiratory failure due to pulmonary edema/concern for superinfection bacterial pneumonia -Status post 8 days of antibiotics -Wean off oxygen as tolerated, currently on room air. 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 Lasix, she seems to be tolerating it well, creatinine 1.2 this morning  Acute metabolic encephalopathy -Better, overall improved  Type 2 diabetes mellitus -Hemoglobin A1c was 6.9, continue insulin regimen.  CBGs acceptable  Gout -Continue home medications  History of restless leg syndrome -Continue Requip  Sepsis with septic shock -on admission, now resolved  Scheduled Meds: . (feeding supplement) PROSource Plus  30 mL Oral BID BM  . allopurinol  100 mg Oral Daily  . amLODipine  5 mg Oral Daily  . aspirin EC  81 mg Oral Daily  . carvedilol  3.125 mg Oral BID WC  . chlorhexidine gluconate (MEDLINE KIT)  15 mL Mouth Rinse BID  . Chlorhexidine Gluconate Cloth  6 each Topical Daily  . cholecalciferol  1,000 Units Oral Daily  . enoxaparin (LOVENOX) injection  40 mg Subcutaneous Q24H  . feeding supplement  237 mL Oral BID BM  . furosemide  40 mg Oral Daily  . insulin aspart  0-15 Units Subcutaneous TID WC  . mouth rinse  15 mL Mouth Rinse q12n4p  . multivitamin with minerals  1 tablet Oral Daily  . pregabalin  100 mg Oral BID  . rOPINIRole  2 mg Oral QHS   Continuous Infusions: . sodium chloride 250 mL (08/11/20 1958)   PRN Meds:.acetaminophen, acetaminophen, docusate sodium, guaiFENesin, labetalol, lip balm, polyethylene glycol  Diet Orders (From admission, onward)    Start     Ordered   08/17/20 1555  DIET DYS 3 Room service appropriate? Yes; Fluid consistency: Thin  Diet effective now       Question Answer Comment  Room service appropriate? Yes  Fluid consistency: Thin      08/17/20 1556          DVT prophylaxis: enoxaparin (LOVENOX) injection 40 mg Start: 08/11/20 2000     Code Status: Full Code  Family Communication: d/w patient, updated husband over the phone  Status is: Inpatient  Remains inpatient appropriate because:Inpatient level of care appropriate due to severity of illness  Dispo: The patient is from: Home               Anticipated d/c is to: SNF              Anticipated d/c date is: 3 days              Patient currently is not medically stable to d/c.  Consultants:  PCCM  Procedures:  2D echo  Microbiology  none  Antimicrobials: Meropenem    Objective: Vitals:   08/19/20 1130 08/19/20 2014 08/20/20 0439 08/20/20 0500  BP:  (!) 128/55 131/61   Pulse:  85 85   Resp:  (!) 22 (!) 22   Temp:  97.8 F (36.6 C) 97.8 F (36.6 C)   TempSrc:  Oral Oral   SpO2: 97% 95% 93%   Weight:    85.1 kg  Height:        Intake/Output Summary (Last 24 hours) at 08/20/2020 0604 Last data filed at 08/19/2020 1806 Gross per 24 hour  Intake 1416 ml  Output 1225 ml  Net 191 ml   Filed Weights   08/18/20 0500 08/19/20 0500 08/20/20 0500  Weight: 88.6 kg 90.5 kg 85.1 kg    Examination:  Constitutional: No distress Eyes: No scleral icterus ENMT: mmm Neck: normal, supple Respiratory: Clear bilaterally, no wheezing, no crackles Cardiovascular: Regular rate and rhythm, no murmur, no edema Abdomen: Soft, nontender, nondistended, bowel sounds positive Musculoskeletal: no clubbing / cyanosis.  Skin: No rashes seen Neurologic: No focal deficits  Data Reviewed: I have independently reviewed following labs and imaging studies   CBC: Recent Labs  Lab 08/14/20 0246 08/15/20 0001 08/16/20 0247 08/19/20 0348 08/20/20 0411  WBC 4.6 5.3 5.0 5.4 7.0  HGB 11.1* 12.0 12.7 12.4 13.1  HCT 34.1* 37.5 38.8 37.9 40.6  MCV 91.2 92.1 90.4 88.8 90.6  PLT 165 190 233 256 832   Basic Metabolic Panel: Recent Labs  Lab 08/13/20 1430 08/14/20 0246 08/15/20 0204 08/16/20 0247 08/16/20 1045 08/17/20 0114 08/19/20 0348 08/20/20 0411  NA 140 139 148* 148*  --  142 140 139  K 3.6 3.5 3.4* 3.5 4.0 3.5 3.5 3.3*  CL 106 109 110 108  --  102 99 98  CO2 22 19* 23 26  --  26 26 28   GLUCOSE 235* 339* 180* 160*  --  133* 178* 196*  BUN 48* 45* 40* 47*  --  52* 58* 53*  CREATININE 1.78* 1.34* 1.27* 1.20*  --  1.15*  1.14* 1.19*  CALCIUM 8.3* 8.3* 9.3 9.3  --  8.9 8.8* 9.2  MG 2.6*  --  1.8 2.1  --   --   --   --   PHOS  --  2.9 3.3  --   --   --   --   --    Liver Function Tests: Recent Labs  Lab 08/14/20 0246 08/15/20 0204 08/19/20 0348 08/20/20 0411  AST 61* 68* 37 32  ALT 46* 50* 53* 45*  ALKPHOS 114 99 88 92  BILITOT 0.6 1.2 0.8 0.7  PROT 5.7* 6.5 6.3* 6.7  ALBUMIN 2.7* 2.6* 2.8* 3.1*   Coagulation Profile: No results for input(s): INR, PROTIME in the last 168 hours. HbA1C: No results for input(s): HGBA1C in the last 72 hours. CBG: Recent Labs  Lab 08/19/20 1142 08/19/20 1646 08/19/20 2017 08/19/20 2353 08/20/20 0442  GLUCAP 145* 195* 195* 185* 173*    Recent Results (from the past 240 hour(s))  MRSA PCR Screening     Status: None   Collection Time: 08/11/20  4:27 PM   Specimen: Nasal Mucosa; Nasopharyngeal  Result Value Ref Range Status   MRSA by PCR NEGATIVE NEGATIVE Final    Comment:        The GeneXpert MRSA Assay (FDA approved for NASAL specimens only), is one component of a comprehensive MRSA colonization surveillance program. It is not intended to diagnose MRSA infection nor to guide or monitor treatment for MRSA infections. Performed at [facility], [place] [address], [place], [place] [address]   Culture, respiratory (non-expectorated)     Status: None   Collection Time: 08/11/20  6:13 PM   Specimen: Tracheal Aspirate; Respiratory  Result Value Ref Range Status   Specimen Description   Final    TRACHEAL ASPIRATE Performed at [facility] [address], [place], [place] [address]    Special Requests   Final    NONE Performed at [facility], [place] [address], [place], [place] [address]    Gram Stain   Final    ABUNDANT WBC PRESENT, PREDOMINANTLY PMN RARE GRAM POSITIVE COCCI    Culture   Final    RARE Consistent with normal respiratory flora. NO STAPHYLOCOCCUS AUREUS ISOLATED Performed at [facility], [place] [address] [place], [place] [address]    Report Status 08/14/2020 FINAL  Final  Culture, blood (Routine X 2) w Reflex to ID Panel     Status: None   Collection Time: 08/11/20  7:21 PM   Specimen: BLOOD  Result Value Ref Range Status   Specimen Description   Final    BLOOD RIGHT HAND Performed at [place] [address] [place], [place] [address]    Special Requests   Final    BOTTLES DRAWN AEROBIC ONLY Blood Culture adequate volume Performed at [facility] [address] [place], [place] [address]    Culture   Final    NO GROWTH 5 DAYS Performed at [facility], [place] [address] [place], [place] [address]    Report Status 08/16/2020 FINAL  Final  Culture, blood (Routine X 2) w Reflex to ID Panel     Status: None   Collection Time: 08/11/20  7:21 PM   Specimen: BLOOD  Result Value Ref Range Status   Specimen Description   Final    BLOOD RIGHT HAND Performed at [facility] [address], [place], [place] [address]    Special Requests   Final    BOTTLES DRAWN AEROBIC ONLY Blood Culture adequate volume Performed at [facility] [address], [place], [place] [address]    Culture   Final    NO GROWTH 5 DAYS Performed at [facility], [place] [address], [place], [place] [address]    Report Status 08/16/2020 FINAL  Final     Radiology Studies: No results found.   Marzetta Board, MD, PhD [facility]  Between 7 am - 7 pm I am available, please contact me via Amion or [place]  Between  7 pm - 7 am I am not available, please contact night coverage MD/APP via Amion

## 2020-08-21 DIAGNOSIS — J9690 Respiratory failure, unspecified, unspecified whether with hypoxia or hypercapnia: Secondary | ICD-10-CM

## 2020-08-21 DIAGNOSIS — I5023 Acute on chronic systolic (congestive) heart failure: Secondary | ICD-10-CM | POA: Diagnosis not present

## 2020-08-21 DIAGNOSIS — U071 COVID-19: Secondary | ICD-10-CM | POA: Diagnosis not present

## 2020-08-21 DIAGNOSIS — I509 Heart failure, unspecified: Secondary | ICD-10-CM | POA: Diagnosis not present

## 2020-08-21 LAB — BASIC METABOLIC PANEL
Anion gap: 12 (ref 5–15)
BUN: 44 mg/dL — ABNORMAL HIGH (ref 8–23)
CO2: 26 mmol/L (ref 22–32)
Calcium: 9.1 mg/dL (ref 8.9–10.3)
Chloride: 100 mmol/L (ref 98–111)
Creatinine, Ser: 1.06 mg/dL — ABNORMAL HIGH (ref 0.44–1.00)
GFR, Estimated: 55 mL/min — ABNORMAL LOW (ref >60)
Glucose, Bld: 175 mg/dL — ABNORMAL HIGH (ref 70–99)
Potassium: 3.7 mmol/L (ref 3.5–5.1)
Sodium: 138 mmol/L (ref 135–145)

## 2020-08-21 LAB — GLUCOSE, CAPILLARY
Glucose-Capillary: 161 mg/dL — ABNORMAL HIGH (ref 70–99)
Glucose-Capillary: 241 mg/dL — ABNORMAL HIGH (ref 70–99)

## 2020-08-21 MED ORDER — COLCHICINE 0.6 MG PO TABS: 0.6000 mg | ORAL_TABLET | Freq: Every day | ORAL | 0 refills | Status: DC

## 2020-08-21 MED ORDER — FUROSEMIDE 40 MG PO TABS: 40.0000 mg | ORAL_TABLET | Freq: Every day | ORAL | 0 refills | Status: DC

## 2020-08-21 MED ORDER — POTASSIUM CHLORIDE ER 10 MEQ PO TBCR: 20.0000 meq | EXTENDED_RELEASE_TABLET | Freq: Every day | ORAL | 0 refills | Status: DC

## 2020-08-21 NOTE — Care Management Important Message (Signed)
Important Message  Patient Details IM Letter placed in Patient's door Caddy. Name: [name] MRN: [id number] Date of Birth: 10-Nov-1945   Medicare Important Message Given:  Yes     Kerin Salen 08/21/2020, 10:35 AM

## 2020-08-21 NOTE — Discharge Summary (Signed)
Physician Discharge Summary  [name] [id number] DOB: 09/18/1945 DOA: 08/10/2020  PCP: Freada Bergeron, MD  Admit date: 08/10/2020 Discharge date: 08/21/2020  Admitted From: home Disposition:  home  Recommendations for Outpatient Follow-up:  1. Follow up with PCP as scheduled on Monday 2. Please obtain BMP/CBC at your next appointment  3. Follow up with cardiology as scheduled   Home Health: PT Equipment/Devices: none  Discharge Condition: stable CODE STATUS: Full code Diet recommendation: low sodium  HPI: Per admitting MD, ED 1/10 pm  with sob. Pt was admitted from 1/3 to 1/6 for Covid pneumonia - 1st symptoms around 07/25/20 . Pt also had a CHF exacerbation while there and had an ECHO which showed an EF of 20-25%. She was seen by cardiology in the hospital. Plan for cardiac cath vs coronary artery CT as an outpatient. Pt developed a rash to decadron. Pt received 4 days of remdesivir in the hospital. She was d/c on room air. Pm 1/10  pt developed worsening sob. The pt is unable to give me  hx due to extreme sob. She had O2 sats of 72% on RA. She was placed on a 100% NRB and sats only went up to 88% > intubated and plan transfer to [place] / Discharge diagnoses: Principal Problem Acute hypoxic respiratory failure due to pulmonary edema due to acute systolic CHF / concern for superinfection bacterial pneumonia -patient was initially admitted for COVID-19, was found to have acute CHF with an EF of 20-25%.  She was discharged with cardiology follow-up, however at home she has noticed some lower extremity swelling and progressive shortness of breath requiring hospitalization.  She was initially admitted to the ICU as she required intubation in the ED.  She was diuresed aggressively, was able to be extubated and eventually weaned off to room air.  Highest weight during this admission was 205 and she was discharged home 187 pounds (bed weight).  She will be  placed on diuretics upon discharge with Lasix 40 mg daily which she tolerated well in the hospital.  Patient was advised for low-sodium diet, daily weights and keep a log, call PCP right away if he gains more than 3 pounds within 1 to 2 days.  She was placed on potassium supplementation.  She has follow-up with PCP in 3 days, advised repeat renal function as she is on Lasix.  She will also see cardiology as an outpatient.  Active Problems Gout of right great toe -likely in the setting of diuresis, placed on colchicine, improving and will do colchicine for 4 additional days for total 5-day course.  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  Recent Covid -no active issues, up until 1/24 so okay to see PCP on Monday Acute kidney injury on chronic kidney disease stage IIIa -Baseline creatinine 1.1-1.3, creatinine as high as 1.78.  Acute kidney injury has resolved, she is able to tolerate diuresis with Lasix, renal function is stable, recommend outpatient follow-up and repeat blood work Acute metabolic encephalopathy -resolved Type 2 diabetes mellitus -Hemoglobin A1c was 6.9, continue home regimen  Gout -Continue home medications History of restless leg syndrome -Continue Requip Sepsis with septic shock -on admission, now resolved    Discharge Instructions   Allergies as of 08/21/2020      Reactions   Amlodipine Swelling   Depo-medrol [methylprednisolone] Hives   Gabapentin Rash   hives   Levothyroxine Other (  See Comments)   severe muscle pain in neck   Simvastatin Rash   Sitagliptin Other (See Comments)   Urinary hesitancy   Ace Inhibitors    cough   Betadine [povidone Iodine]    rash   Codeine Nausea And Vomiting   Cortizone-10 [hydrocortisone] Other (See Comments)   unknown   Tetracyclines & Related Nausea And Vomiting   Dexamethasone Rash      Medication List    TAKE these  medications   allopurinol 100 MG tablet Commonly known as: ZYLOPRIM Take 100 mg by mouth daily.   amLODipine 5 MG tablet Commonly known as: NORVASC Take 5 mg by mouth daily.   aspirin 81 MG tablet Take 81 mg by mouth daily.   carvedilol 3.125 MG tablet Commonly known as: COREG Take 1 tablet (3.125 mg total) by mouth 2 (two) times daily with a meal.   cholecalciferol 1000 units tablet Commonly known as: VITAMIN D Take 1,000 Units by mouth daily.   colchicine 0.6 MG tablet Take 1 tablet (0.6 mg total) by mouth daily for 4 days.   cycloSPORINE 0.05 % ophthalmic emulsion Commonly known as: RESTASIS Place 1 drop into both eyes 2 (two) times daily.   diphenhydrAMINE 25 MG tablet Commonly known as: BENADRYL Take 1 tablet (25 mg total) by mouth every 8 (eight) hours as needed for itching.   Dulaglutide 0.75 MG/0.5ML Sopn Inject 0.75 mg into the skin once a week.   fluticasone 50 MCG/ACT nasal spray Commonly known as: FLONASE Place 1 spray into both nostrils daily as needed for allergies.   furosemide 40 MG tablet Commonly known as: LASIX Take 1 tablet (40 mg total) by mouth daily.   glipiZIDE-metformin 5-500 MG tablet Commonly known as: METAGLIP Take 1 tablet by mouth 2 (two) times daily before a meal.   Magnesium 250 MG Tabs Take 1 tablet by mouth daily.   multivitamin with minerals Tabs tablet Take 1 tablet by mouth daily.   Nucynta 50 MG tablet Generic drug: tapentadol Take 50 mg by mouth at bedtime.   ondansetron 4 MG tablet Commonly known as: Zofran Take 1 tablet (4 mg total) by mouth every 8 (eight) hours as needed for nausea or vomiting.   potassium chloride 10 MEQ tablet Commonly known as: KLOR-CON Take 2 tablets (20 mEq total) by mouth daily.   pregabalin 100 MG capsule Commonly known as: LYRICA Take 100 mg by mouth 2 (two) times daily.   rOPINIRole 2 MG tablet Commonly known as: REQUIP Take 2 mg by mouth at bedtime.   zinc sulfate 220 (50 Zn)  MG capsule Take 1 capsule (220 mg total) by mouth daily.       Follow-up Information    [facility] Follow up.   Specialty: Home Health Services Why: This is the agency that will be providing in home physical therapy.  They may not be able to come out until Wednesday of next week as the inclement weather put everyone behind.  They will call you to let you know when you can expect them. Contact information: [place] [address] [phone]               Consultations:  PCCM  Procedures/Studies:  DG Chest 2 View  Result Date: 08/03/2020 CLINICAL DATA:  Increasing shortness of breath, cough and central chest pain for 1 week. Sinus infection 2 weeks ago. EXAM: CHEST - 2 VIEW COMPARISON:  Radiographs 07/04/2018 and 06/29/2009. FINDINGS: The heart size  and mediastinal contours are stable. There is mild aortic atherosclerosis. Interval diffusely increased interstitial prominence with central airway and fissural thickening. No confluent airspace opacity, significant pleural effusion or pneumothorax. There are mild degenerative changes within the thoracic spine. IMPRESSION: Interval diffusely increased interstitial prominence with central airway and fissural thickening, suspicious for interstitial edema. Progressive chronic interstitial lung disease considered less likely. Suggest short-term radiographic follow-up. Electronically Signed   By: Richardean Sale M.D.   On: 08/03/2020 12:23   DG Abd 1 View  Result Date: 08/11/2020 CLINICAL DATA:  NG tube placement EXAM: ABDOMEN - 1 VIEW COMPARISON:  None. FINDINGS: NG tube is in the distal stomach. IMPRESSION: NG tube in the stomach. Electronically Signed   By: Rolm Baptise M.D.   On: 08/11/2020 19:24   DG Chest Port 1 View  Result Date: 08/16/2020 CLINICAL DATA:  Respiratory failure EXAM: PORTABLE CHEST 1 VIEW COMPARISON:  August 14, 2020 FINDINGS: There are moderate-sized bilateral pleural effusions,  stable from prior study. The heart size is stable. Bibasilar atelectasis is noted. There are hazy persistent bilateral airspace opacities without evidence for pneumothorax. Aortic calcifications are noted. IMPRESSION: 1. Persistent bilateral pleural effusions with adjacent atelectasis. 2. Hazy persistent bilateral airspace opacities,. Electronically Signed   By: Constance Holster M.D.   On: 08/16/2020 05:57   DG CHEST PORT 1 VIEW  Result Date: 08/14/2020 CLINICAL DATA:  Extubated, COVID EXAM: PORTABLE CHEST 1 VIEW COMPARISON:  08/12/2020 FINDINGS: Interval removal of the endotracheal and transesophageal tube seen on comparison exam. Some persistently low lung volumes are present with gradient opacity towards the lung bases compatible with layering pleural effusions and likely atelectasis. No pneumothorax. Overall extent of opacity is quite similar to comparison exam accounting for differences in positioning and technique. Grossly stable cardiomediastinal contours with a calcified aorta. No acute osseous or soft tissue abnormality. Telemetry leads overlie the chest. IMPRESSION: 1. Interval removal of the endotracheal and transesophageal tubes. 2. Persistently low lung volumes and atelectasis with layering bilateral effusions. Similar extent to prior. Electronically Signed   By: Lovena Le M.D.   On: 08/14/2020 06:41   DG CHEST PORT 1 VIEW  Result Date: 08/12/2020 CLINICAL DATA:  75 year old female COVID-11.  Intubated. EXAM: PORTABLE CHEST 1 VIEW COMPARISON:  Portable chest 08/10/2020 and earlier. FINDINGS: Portable AP semi upright view at 0501 hours. Endotracheal tube tip appears stable terminating above the carina. Internal and external portions of the enteric tube are visible, internal configuration appears stable with tip not included. More rotated to the left. Mediastinal contours remain normal. Coarse and confluent bilateral upper lung opacity has regressed, although bilateral lower lung veiling  opacity has increased. No pneumothorax or chest wall gas. Paucity of bowel gas in the upper abdomen. No acute osseous abnormality identified. IMPRESSION: 1.  Stable lines and tubes. 2. Bilateral upper lung pneumonia appears regressed since 08/10/2020, although increased bilateral pleural effusions are suspected. Electronically Signed   By: Genevie Ann M.D.   On: 08/12/2020 05:39   DG Chest Portable 1 View  Result Date: 08/10/2020 CLINICAL DATA:  COVID positive.  Post intubation. EXAM: PORTABLE CHEST 1 VIEW COMPARISON:  Radiograph earlier today. FINDINGS: Endotracheal tube tip 2.5 cm from the carina. Tip and side port of the enteric tube below the diaphragm in the stomach. Heterogeneous bilateral lung opacities with improvement from earlier today. Stable heart size and mediastinal contours. Possible small pleural effusions. No pneumothorax. IMPRESSION: 1. Endotracheal tube tip 2.5 cm from the carina. Enteric tube in place. 2. Heterogeneous  bilateral lung opacities with improvement from earlier today, likely improving pulmonary edema. Underlying pneumonia also considered in the setting of COVID. Possible small pleural effusions. Electronically Signed   By: Keith Rake M.D.   On: 08/10/2020 23:43   DG Chest Port 1 View  Result Date: 08/10/2020 CLINICAL DATA:  75 year old female with shortness of breath. EXAM: PORTABLE CHEST 1 VIEW COMPARISON:  Chest radiograph dated 08/03/2020. FINDINGS: Diffuse interstitial prominence and airspace opacities throughout the lungs may represent edema, pneumonia, or combination. Clinical correlation is recommended. Small bilateral pleural effusions noted. No pneumothorax. Top-normal cardiac size. Atherosclerotic calcification of the aorta. No acute osseous pathology. IMPRESSION: Diffuse interstitial prominence and airspace opacities may represent edema, pneumonia, or combination. Small bilateral pleural effusions. Electronically Signed   By: Anner Crete M.D.   On: 08/10/2020  22:46   ECHOCARDIOGRAM COMPLETE  Result Date: 08/04/2020    ECHOCARDIOGRAM REPORT   Patient Name:   [name] Date of Exam: 08/04/2020 Medical Rec #:  [id number]      Height:       65.0 in Accession #:    [id number]     Weight:       194.0 lb Date of Birth:  1945/10/04      BSA:          1.953 m Patient Age:    51 years       BP:           127/85 mmHg Patient Gender: F              HR:           85 bpm. Exam Location:  Inpatient Procedure: 2D Echo, Cardiac Doppler and Color Doppler Indications:    R94.31 Abnormal EKG  History:        Patient has no prior history of Echocardiogram examinations.                 Mitral Valve Prolapse; Risk Factors:Diabetes, Hypertension and                 GERD. COVID-19 Positive.  Sonographer:    Tiffany Dance Referring Phys: [id number] [place]  1. Left ventricular ejection fraction, by estimation, is 20 to 25%. The left ventricle has severely decreased function. The left ventricle demonstrates global hypokinesis. The left ventricular internal cavity size was mildly to moderately dilated. Left ventricular diastolic parameters are consistent with Grade II diastolic dysfunction (pseudonormalization). Elevated left atrial pressure.  2. Right ventricular systolic function is normal. The right ventricular size is normal. There is mildly elevated pulmonary artery systolic pressure. The estimated right ventricular systolic pressure is [id number] mmHg.  3. Left atrial size was moderately dilated.  4. The mitral valve is degenerative. Mild mitral valve regurgitation. No evidence of mitral stenosis.  5. The aortic valve is tricuspid. There is mild calcification of the aortic valve. There is mild thickening of the aortic valve. Aortic valve regurgitation is not visualized. Mild aortic valve sclerosis is present, with no evidence of aortic valve stenosis.  6. The inferior vena cava is dilated in size with <50% respiratory variability, suggesting right atrial pressure of 15 mmHg.  FINDINGS  Left Ventricle: Left ventricular ejection fraction, by estimation, is 20 to 25%. The left ventricle has severely decreased function. The left ventricle demonstrates global hypokinesis. The left ventricular internal cavity size was mildly to moderately dilated. There is no left ventricular hypertrophy. Left ventricular diastolic parameters are consistent with Grade II diastolic dysfunction (pseudonormalization).  Elevated left atrial pressure. Right Ventricle: The right ventricular size is normal. No increase in right ventricular wall thickness. Right ventricular systolic function is normal. There is mildly elevated pulmonary artery systolic pressure. The tricuspid regurgitant velocity is 2.39  m/s, and with an assumed right atrial pressure of 15 mmHg, the estimated right ventricular systolic pressure is [id number] mmHg. Left Atrium: Left atrial size was moderately dilated. Right Atrium: Right atrial size was normal in size. Pericardium: Trivial pericardial effusion is present. Mitral Valve: The mitral valve is degenerative in appearance. Mild mitral annular calcification. Mild mitral valve regurgitation. No evidence of mitral valve stenosis. Tricuspid Valve: The tricuspid valve is grossly normal. Tricuspid valve regurgitation is mild . No evidence of tricuspid stenosis. Aortic Valve: The aortic valve is tricuspid. There is mild calcification of the aortic valve. There is mild thickening of the aortic valve. Aortic valve regurgitation is not visualized. Mild aortic valve sclerosis is present, with no evidence of aortic valve stenosis. Pulmonic Valve: The pulmonic valve was grossly normal. Pulmonic valve regurgitation is trivial. No evidence of pulmonic stenosis. Aorta: The aortic root and ascending aorta are structurally normal, with no evidence of dilitation. Venous: The inferior vena cava is dilated in size with less than 50% respiratory variability, suggesting right atrial pressure of 15 mmHg. IAS/Shunts: The  atrial septum is grossly normal.  LEFT VENTRICLE PLAX 2D LVIDd:         5.70 cm LVIDs:         4.90 cm LV PW:         1.10 cm LV IVS:        0.80 cm LVOT diam:     1.80 cm LV SV:         43 LV SV Index:   22 LVOT Area:     2.54 cm  RIGHT VENTRICLE             IVC RV Basal diam:  2.90 cm     IVC diam: 2.30 cm RV S prime:     12.30 cm/s TAPSE (M-mode): 1.9 cm LEFT ATRIUM             Index       RIGHT ATRIUM           Index LA diam:        5.10 cm 2.61 cm/m  RA Area:     14.20 cm LA Vol (A2C):   89.8 ml 45.99 ml/m RA Volume:   35.50 ml  18.18 ml/m LA Vol (A4C):   83.8 ml 42.91 ml/m LA Biplane Vol: 88.6 ml 45.37 ml/m  AORTIC VALVE LVOT Vmax:   70.10 cm/s LVOT Vmean:  51.500 cm/s LVOT VTI:    0.168 m  AORTA Ao Root diam: 3.00 cm Ao Asc diam:  3.10 cm MITRAL VALVE                TRICUSPID VALVE MV Area (PHT): 3.65 cm     TR Peak grad:   22.8 mmHg MV Decel Time: 208 msec     TR Vmax:        239.00 cm/s MV E velocity: 118.00 cm/s MV A velocity: 86.60 cm/s   SHUNTS MV E/A ratio:  1.36         Systemic VTI:  0.17 m                             Systemic Diam: 1.80 cm Eleonore Chiquito  MD Electronically signed by Eleonore Chiquito MD Signature Date/Time: 08/04/2020/4:32:33 PM    Final    VAS [place] LOWER EXTREMITY VENOUS (DVT)  Result Date: 08/12/2020  Lower Venous DVT Study Indications: Edema.  Risk Factors: COVID 19 positive. Limitations: Poor ultrasound/tissue interface. Comparison Study: No prior studies. Performing Technologist: Oliver Hum RVT  Examination Guidelines: A complete evaluation includes B-mode imaging, spectral Doppler, color Doppler, and power Doppler as needed of all accessible portions of each vessel. Bilateral testing is considered an integral part of a complete examination. Limited examinations for reoccurring indications may be performed as noted. The reflux portion of the exam is performed with the patient in reverse Trendelenburg.   +---------+---------------+---------+-----------+----------+--------------+  RIGHT     Compressibility Phasicity Spontaneity Properties Thrombus Aging  +---------+---------------+---------+-----------+----------+--------------+  CFV       Full            Yes       Yes                                    +---------+---------------+---------+-----------+----------+--------------+  SFJ       Full                                                             +---------+---------------+---------+-----------+----------+--------------+  FV Prox   Full                                                             +---------+---------------+---------+-----------+----------+--------------+  FV Mid    Full                                                             +---------+---------------+---------+-----------+----------+--------------+  FV Distal Full                                                             +---------+---------------+---------+-----------+----------+--------------+  PFV       Full                                                             +---------+---------------+---------+-----------+----------+--------------+  POP       Full            Yes       Yes                                    +---------+---------------+---------+-----------+----------+--------------+  PTV       Full                                                             +---------+---------------+---------+-----------+----------+--------------+  PERO      Full                                                             +---------+---------------+---------+-----------+----------+--------------+   +---------+---------------+---------+-----------+----------+--------------+  LEFT      Compressibility Phasicity Spontaneity Properties Thrombus Aging  +---------+---------------+---------+-----------+----------+--------------+  CFV       Full            Yes       Yes                                     +---------+---------------+---------+-----------+----------+--------------+  SFJ       Full                                                             +---------+---------------+---------+-----------+----------+--------------+  FV Prox   Full                                                             +---------+---------------+---------+-----------+----------+--------------+  FV Mid    Full                                                             +---------+---------------+---------+-----------+----------+--------------+  FV Distal Full                                                             +---------+---------------+---------+-----------+----------+--------------+  PFV       Full                                                             +---------+---------------+---------+-----------+----------+--------------+  POP       Full            Yes       Yes                                    +---------+---------------+---------+-----------+----------+--------------+  PTV       Full                                                             +---------+---------------+---------+-----------+----------+--------------+  PERO      Full                                                             +---------+---------------+---------+-----------+----------+--------------+     Summary: RIGHT: - There is no evidence of deep vein thrombosis in the lower extremity.  - No cystic structure found in the popliteal fossa.  LEFT: - There is no evidence of deep vein thrombosis in the lower extremity.  - No cystic structure found in the popliteal fossa.  *See table(s) above for measurements and observations. Electronically signed by Curt Jews MD on 08/12/2020 at 2:42:15 PM.    Final    ECHOCARDIOGRAM LIMITED  Result Date: 08/14/2020    ECHOCARDIOGRAM LIMITED REPORT   Patient Name:   [name] Date of Exam: 08/14/2020 Medical Rec #:  [id number]      Height:       65.0 in Accession #:    [id number]     Weight:       205.5  lb Date of Birth:  08/28/1945      BSA:          2.001 m Patient Age:    53 years       BP:           138/54 mmHg Patient Gender: F              HR:           78 bpm. Exam Location:  Inpatient Procedure: Limited Echo, Cardiac Doppler and Color Doppler Indications:    Acute respirtory distress  History:        Patient has prior history of Echocardiogram examinations, most                 recent 08/04/2020. Risk Factors:Diabetes and Hypertension. MVP.                 GERD. Covid-19.  Sonographer:    Clayton Lefort RDCS (AE) Referring Phys: Brand Males  Sonographer Comments: Suboptimal parasternal window. IMPRESSIONS  1. Left ventricular ejection fraction, by estimation, is 20 to 25%. The left ventricle has severely decreased function. The left ventricle has no regional wall motion abnormalities. Left ventricular diastolic parameters are indeterminate.  2. Right ventricular systolic function was not well visualized. The right ventricular size is not well visualized.  3. Left atrial size was moderately dilated.  4. The mitral valve is grossly normal. Mild mitral valve regurgitation. There is a second jet of mitral regurgitation seen in the four chamber view with eccentric pattern, mitral regurgitation may be underestimated in this study.  5. The aortic valve is tricuspid. Aortic valve regurgitation is not visualized. No aortic stenosis is present.  6. The inferior vena cava is dilated in size with <50% respiratory variability, suggesting right atrial pressure of 15 mmHg. Comparison(s): A prior study was performed on 08/04/2020.  No significant change from prior study. FINDINGS  Left Ventricle: No left ventricular thrombus visualized. Left ventricular ejection fraction, by estimation, is 20 to 25%. The left ventricle has severely decreased function. The left ventricle has no regional wall motion abnormalities. Left ventricular diastolic parameters are indeterminate. Right Ventricle: The right ventricular size is not well  visualized. Right vetricular wall thickness was not assessed. Right ventricular systolic function was not well visualized. Left Atrium: Left atrial size was moderately dilated. Right Atrium: Right atrial size was normal in size. Mitral Valve: Two jets of mitral regurgitation. The mitral valve is grossly normal. Mild mitral valve regurgitation. MV peak gradient, 7.0 mmHg. The mean mitral valve gradient is 3.0 mmHg with average heart rate of 84 bpm. Tricuspid Valve: The tricuspid valve is grossly normal. Tricuspid valve regurgitation is trivial. Aortic Valve: The aortic valve is tricuspid. Aortic valve regurgitation is not visualized. No aortic stenosis is present. Pulmonic Valve: The pulmonic valve was not well visualized. Pulmonic valve regurgitation is not visualized. Aorta: The aortic root is normal in size and structure. Venous: The inferior vena cava is dilated in size with less than 50% respiratory variability, suggesting right atrial pressure of 15 mmHg. LEFT VENTRICLE PLAX 2D LVOT diam:     2.20 cm LVOT Area:     3.80 cm  IVC IVC diam: 2.38 cm  AORTA Ao Root diam: 2.90 cm MITRAL VALVE MV Area (PHT): 4.57 cm     SHUNTS MV Peak grad:  7.0 mmHg     Systemic Diam: 2.20 cm MV Mean grad:  3.0 mmHg MV Vmax:       1.32 m/s MV Vmean:      78.0 cm/s MV Decel Time: 166 msec MV E velocity: 110.00 cm/s MV A velocity: 122.00 cm/s MV E/A ratio:  0.90 Rudean Haskell MD Electronically signed by Rudean Haskell MD Signature Date/Time: 08/14/2020/12:31:38 PM    Final       Subjective: - no chest pain, shortness of breath, no abdominal pain, nausea or vomiting.   Discharge Exam: BP 134/66 (BP Location: Right Arm)    Pulse 86    Temp (!) 97.5 F (36.4 C) (Oral)    Resp 18    Ht 5\' 5"  (1.651 m)    Wt 85.1 kg    SpO2 98%    BMI 31.22 kg/m   General: Pt is alert, awake, not in acute distress Cardiovascular: RRR, S1/S2 +, no rubs, no gallops Respiratory: CTA bilaterally, no wheezing, no rhonchi Abdominal:  Soft, NT, ND, bowel sounds + Extremities: no edema, no cyanosis  The results of significant diagnostics from this hospitalization (including imaging, microbiology, ancillary and laboratory) are listed below for reference.     Microbiology: Recent Results (from the past 240 hour(s))  MRSA PCR Screening     Status: None   Collection Time: 08/11/20  4:27 PM   Specimen: Nasal Mucosa; Nasopharyngeal  Result Value Ref Range Status   MRSA by PCR NEGATIVE NEGATIVE Final    Comment:        The GeneXpert MRSA Assay (FDA approved for NASAL specimens only), is one component of a comprehensive MRSA colonization surveillance program. It is not intended to diagnose MRSA infection nor to guide or monitor treatment for MRSA infections. Performed at [facility], [place] [address] [place], [place] [address]   Culture, respiratory (non-expectorated)     Status: None   Collection Time: 08/11/20  6:13 PM   Specimen: Tracheal Aspirate; Respiratory  Result Value Ref  Range Status   Specimen Description   Final    TRACHEAL ASPIRATE Performed at [place] [address], [place], [place] [address]    Special Requests   Final    NONE Performed at [facility], [place] [address] [place], [place] [address]    Gram Stain   Final    ABUNDANT WBC PRESENT, PREDOMINANTLY PMN RARE GRAM POSITIVE COCCI    Culture   Final    RARE Consistent with normal respiratory flora. NO STAPHYLOCOCCUS AUREUS ISOLATED Performed at [facility], [place] [address] [place], [place] [address]    Report Status 08/14/2020 FINAL  Final  Culture, blood (Routine X 2) w Reflex to ID Panel     Status: None   Collection Time: 08/11/20  7:21 PM   Specimen: BLOOD  Result Value Ref Range Status   Specimen Description   Final    BLOOD RIGHT HAND Performed at [place] [address] [place], [place] [address]    Special Requests   Final     BOTTLES DRAWN AEROBIC ONLY Blood Culture adequate volume Performed at [place] [address] [place], [place] [address]    Culture   Final    NO GROWTH 5 DAYS Performed at [facility], [place] [address] [place], [place] [address]    Report Status 08/16/2020 FINAL  Final  Culture, blood (Routine X 2) w Reflex to ID Panel     Status: None   Collection Time: 08/11/20  7:21 PM   Specimen: BLOOD  Result Value Ref Range Status   Specimen Description   Final    BLOOD RIGHT HAND Performed at [place] [address] [place], [place] [address]    Special Requests   Final    BOTTLES DRAWN AEROBIC ONLY Blood Culture adequate volume Performed at [place] [address] [place], [place] [address]    Culture   Final    NO GROWTH 5 DAYS Performed at [facility], [place] [address] [place],  [address]    Report Status 08/16/2020 FINAL  Final     Labs: Basic Metabolic Panel: Recent Labs  Lab 08/15/20 0204 08/16/20 0247 08/16/20 1045 08/17/20 0114 08/19/20 0348 08/20/20 0411 08/21/20 0400  NA 148* 148*  --  142 140 139 138  K 3.4* 3.5 4.0 3.5 3.5 3.3* 3.7  CL 110 108  --  102 99 98 100  CO2 23 26  --  26 26 28 26   GLUCOSE 180* 160*  --  133* 178* 196* 175*  BUN 40* 47*  --  52* 58* 53* 44*  CREATININE 1.27* 1.20*  --  1.15* 1.14* 1.19* 1.06*  CALCIUM 9.3 9.3  --  8.9 8.8* 9.2 9.1  MG 1.8 2.1  --   --   --   --   --   PHOS 3.3  --   --   --   --   --   --    Liver Function Tests: Recent Labs  Lab 08/15/20 0204 08/19/20 0348 08/20/20 0411  AST 68* 37 32  ALT 50* 53* 45*  ALKPHOS 99 88 92  BILITOT 1.2 0.8 0.7  PROT 6.5 6.3* 6.7  ALBUMIN 2.6* 2.8* 3.1*   CBC: Recent Labs  Lab 08/15/20 0001 08/16/20 0247 08/19/20 0348 08/20/20 0411  WBC 5.3 5.0 5.4 7.0  HGB 12.0 12.7 12.4 13.1  HCT 37.5  38.8 37.9 40.6  MCV 92.1 90.4 88.8 90.6  PLT 190 233 256 319   CBG: Recent Labs  Lab  08/20/20 1156 08/20/20 1549 08/20/20 2027 08/20/20 2346 08/21/20 0428  GLUCAP 219* 167* 245* 189* 161*   Hgb A1c No results for input(s): HGBA1C in the last 72 hours. Lipid Profile No results for input(s): CHOL, HDL, LDLCALC, TRIG, CHOLHDL, LDLDIRECT in the last 72 hours. Thyroid function studies No results for input(s): TSH, T4TOTAL, T3FREE, THYROIDAB in the last 72 hours.  Invalid input(s): FREET3 Urinalysis    Component Value Date/Time   COLORURINE YELLOW 08/11/2020 0303   APPEARANCEUR CLEAR 08/11/2020 0303   LABSPEC 1.019 08/11/2020 0303   PHURINE 5.0 08/11/2020 0303   GLUCOSEU NEGATIVE 08/11/2020 0303   HGBUR NEGATIVE 08/11/2020 0303   BILIRUBINUR NEGATIVE 08/11/2020 0303   KETONESUR NEGATIVE 08/11/2020 0303   PROTEINUR 30 (A) 08/11/2020 0303   UROBILINOGEN 0.2 06/22/2009 0631   NITRITE NEGATIVE 08/11/2020 0303   LEUKOCYTESUR NEGATIVE 08/11/2020 0303    FURTHER DISCHARGE INSTRUCTIONS:   Get Medicines reviewed and adjusted: Please take all your medications with you for your next visit with your Primary MD   Laboratory/radiological data: Please request your Primary MD to go over all hospital tests and procedure/radiological results at the follow up, please ask your Primary MD to get all Hospital records sent to his/her office.   In some cases, they will be blood work, cultures and biopsy results pending at the time of your discharge. Please request that your primary care M.D. goes through all the records of your hospital data and follows up on these results.   Also Note the following: If you experience worsening of your admission symptoms, develop shortness of breath, life threatening emergency, suicidal or homicidal thoughts you must seek medical attention immediately by calling 911 or calling your MD immediately  if symptoms less severe.   You must read complete instructions/literature along with all the possible adverse reactions/side effects for all the Medicines  you take and that have been prescribed to you. Take any new Medicines after you have completely understood and accpet all the possible adverse reactions/side effects.    Do not drive when taking Pain medications or sleeping medications (Benzodaizepines)   Do not take more than prescribed Pain, Sleep and Anxiety Medications. It is not advisable to combine anxiety,sleep and pain medications without talking with your primary care practitioner   Special Instructions: If you have smoked or chewed Tobacco  in the last 2 yrs please stop smoking, stop any regular Alcohol  and or any Recreational drug use.   Wear Seat belts while driving.   Please note: You were cared for by a hospitalist during your hospital stay. Once you are discharged, your primary care physician will handle any further medical issues. Please note that NO REFILLS for any discharge medications will be authorized once you are discharged, as it is imperative that you return to your primary care physician (or establish a relationship with a primary care physician if you do not have one) for your post hospital discharge needs so that they can reassess your need for medications and monitor your lab values.  Time coordinating discharge: 35 minutes  SIGNED:  Marzetta Board, MD, PhD 08/21/2020, 8:27 AM

## 2020-08-21 NOTE — Progress Notes (Signed)
Went over discharge information and patient verbalized understanding.

## 2020-08-24 ENCOUNTER — Encounter: Payer: Self-pay | Admitting: Family Medicine

## 2020-08-24 ENCOUNTER — Other Ambulatory Visit: Payer: Self-pay

## 2020-08-24 ENCOUNTER — Ambulatory Visit (INDEPENDENT_AMBULATORY_CARE_PROVIDER_SITE_OTHER): Payer: Medicare Other | Admitting: Family Medicine

## 2020-08-24 VITALS — BP 120/70 | HR 96 | Temp 98.0°F | Resp 17 | Ht 65.5 in | Wt 188.0 lb

## 2020-08-24 DIAGNOSIS — G2581 Restless legs syndrome: Secondary | ICD-10-CM

## 2020-08-24 DIAGNOSIS — Z789 Other specified health status: Secondary | ICD-10-CM

## 2020-08-24 DIAGNOSIS — M48061 Spinal stenosis, lumbar region without neurogenic claudication: Secondary | ICD-10-CM

## 2020-08-24 DIAGNOSIS — I152 Hypertension secondary to endocrine disorders: Secondary | ICD-10-CM | POA: Diagnosis not present

## 2020-08-24 DIAGNOSIS — E1159 Type 2 diabetes mellitus with other circulatory complications: Secondary | ICD-10-CM | POA: Diagnosis not present

## 2020-08-24 DIAGNOSIS — Z780 Asymptomatic menopausal state: Secondary | ICD-10-CM | POA: Diagnosis not present

## 2020-08-24 DIAGNOSIS — I5020 Unspecified systolic (congestive) heart failure: Secondary | ICD-10-CM

## 2020-08-24 DIAGNOSIS — E119 Type 2 diabetes mellitus without complications: Secondary | ICD-10-CM | POA: Diagnosis not present

## 2020-08-24 DIAGNOSIS — E782 Mixed hyperlipidemia: Secondary | ICD-10-CM

## 2020-08-24 HISTORY — DX: Unspecified systolic (congestive) heart failure: I50.20

## 2020-08-24 LAB — CBC WITH DIFFERENTIAL/PLATELET
Basophils Absolute: 0.1 10*3/uL (ref 0.0–0.1)
Basophils Relative: 1.8 % (ref 0.0–3.0)
Eosinophils Absolute: 0.1 10*3/uL (ref 0.0–0.7)
Eosinophils Relative: 1.9 % (ref 0.0–5.0)
HCT: 37.1 % (ref 36.0–46.0)
Hemoglobin: 12.3 g/dL (ref 12.0–15.0)
Lymphocytes Relative: 30.4 % (ref 12.0–46.0)
Lymphs Abs: 1.9 10*3/uL (ref 0.7–4.0)
MCHC: 33.3 g/dL (ref 30.0–36.0)
MCV: 87.7 fl (ref 78.0–100.0)
Monocytes Absolute: 0.8 10*3/uL (ref 0.1–1.0)
Monocytes Relative: 12.3 % — ABNORMAL HIGH (ref 3.0–12.0)
Neutro Abs: 3.4 10*3/uL (ref 1.4–7.7)
Neutrophils Relative %: 53.6 % (ref 43.0–77.0)
Platelets: 447 10*3/uL — ABNORMAL HIGH (ref 150.0–400.0)
RBC: 4.24 Mil/uL (ref 3.87–5.11)
RDW: 14.4 % (ref 11.5–15.5)
WBC: 6.4 10*3/uL (ref 4.0–10.5)

## 2020-08-24 LAB — BASIC METABOLIC PANEL WITH GFR
BUN: 45 mg/dL — ABNORMAL HIGH (ref 6–23)
CO2: 27 meq/L (ref 19–32)
Calcium: 9.5 mg/dL (ref 8.4–10.5)
Chloride: 100 meq/L (ref 96–112)
Creatinine, Ser: 1.55 mg/dL — ABNORMAL HIGH (ref 0.40–1.20)
GFR: 32.76 mL/min — ABNORMAL LOW
Glucose, Bld: 210 mg/dL — ABNORMAL HIGH (ref 70–99)
Potassium: 4.7 meq/L (ref 3.5–5.1)
Sodium: 136 meq/L (ref 135–145)

## 2020-08-24 NOTE — Progress Notes (Signed)
Subjective  CC:  Chief Complaint  Patient presents with  . New Patient (Initial Visit)    Previously seen by Dr. Coletta Memos, [facility]. Est. With Dr. Johney Frame next Monday    HPI: [name] is a 75 y.o. female is a former [facility] patient and is here to reestablish care with me today.   I reviewed multiple records dating back many years.  Reviewed prior PCP notes.  Reviewed health hospitalizations from December and January.  Reviewed recent heart studies, x-rays and consult reports.  Data reviewed took 30 minutes. She has the following concerns or needs:  75 year old wonderful married female who presents to establish care.  In December she was diagnosed with Covid infection and subsequently had acute respiratory failure requiring intubation due to Covid pneumonia.  She fortunately recovered.  However not long afterwards she was noting shortness of breath again.  Follow-up study showed her to be in congestive heart failure.  Echocardiogram showed an EF of 20 to 25%.  She has no history of ischemic heart disease.  She was discharged 4 days ago on room air.  Her hospitalization was significant for systolic heart failure, acute renal injury which improved prior to discharge, hypokalemia.  Since her discharge she feels she is doing mostly well.  She continues to have some mild lower extremity edema.  She is no longer shortness of breath.  She feels very fatigued.  No chest pain or palpitations.  However she reports that over the last several years she has noted intermittent palpitations that were never fully evaluated.  She has never had anginal symptoms.  She sees cardiology next week for further evaluation to further clarify the etiology of her cardiomyopathy.  She is in good spirits today.  Recent Covid infection and secondary respiratory failure.  She is fortunate to be recovering.  She understands this.  She remains on vaccinated.  She will consider vaccination but needs to wait any days from treatment.   She is fatigued but denies other complications.  She has hypertension and hyperlipidemia.  These have been well controlled on current medications.  She has had statin intolerance but would be willing to try a statin again.  She is not allergic to statins.  Said she has figured out that it was the prednisone or steroids that gave her hives.  Her blood pressures controlled.  Heart failure: Discharged on Lasix and weight remained stable.  Her dry weight is around 187 and she is 188 pounds today.  She will be seen by cardiology next week for further treatment strategies.  Diabetes follow up: Her diabetic control is reported as Unchanged.  Her control has been good.  She is on a GLP-1 agonist and Metformin but is now on glipizide.  She had been on an SGLT2 inhibitor by patient report but could not afford it. She denies exertional CP or SOB or symptomatic hypoglycemia. She denies foot sores but admits to mild paresthesias.  She has chronic low back pain and spinal stenosis.  She sees a specialist for this.  She is on chronic Lyrica.  Restless leg syndrome is well controlled she takes ropinirole  Health maintenance:: Vaccination is needed as discussed above.  Other screens are up-to-date.  Immunization History  Administered Date(s) Administered  . Fluad Quad(high Dose 65+) 07/01/2020  . Influenza Split 06/01/2010  . Influenza, High Dose Seasonal PF 05/13/2014, 04/24/2015, 04/12/2017, 04/19/2018, 04/24/2019, 06/16/2020  . Influenza, Seasonal, Injecte, Preservative Fre 04/09/2013, 05/03/2016  . Influenza,inj,Quad PF,6+ Mos 05/01/2017  .  Influenza-Unspecified 04/03/2012  . Pneumococcal Conjugate-13 05/13/2014  . Pneumococcal Polysaccharide-23 02/15/2011  . Td 08/01/1998  . Tdap 05/01/2004  . Zoster 02/15/2011  . Zoster Recombinat (Shingrix) 04/24/2019, 08/13/2019    Diabetes Related Lab Review: Lab Results  Component Value Date   HGBA1C 6.9 (H) 08/11/2020   HGBA1C 6.6 (H) 08/03/2020    HGBA1C 9.0 (H) 09/21/2015    No results found for: [facility] Lab Results  Component Value Date   CREATININE 1.55 (H) 08/24/2020   BUN 45 (H) 08/24/2020   NA 136 08/24/2020   K 4.7 08/24/2020   CL 100 08/24/2020   CO2 27 08/24/2020   No results found for: CHOL No results found for: HDL No results found for: [facility] Lab Results  Component Value Date   TRIG 194 (H) 08/13/2020   TRIG 357 (H) 08/12/2020   TRIG 260 (H) 08/10/2020   No results found for: CHOLHDL No results found for: LDLDIRECT The ASCVD Risk score Mikey Bussing DC Jr., et al., 2013) failed to calculate for the following reasons:   Cannot find a previous HDL lab  BP Readings from Last 3 Encounters:  08/24/20 120/70  08/21/20 128/71  08/19/20 (!) 144/73   Wt Readings from Last 3 Encounters:  08/24/20 188 lb (85.3 kg)  08/21/20 187 lb 9.8 oz (85.1 kg)  08/05/20 201 lb 3.2 oz (91.3 kg)    Health Maintenance  Topic Date Due  . Hepatitis C Screening  Never done  . COVID-19 Vaccine (1) Never done  . FOOT EXAM  Never done  . TETANUS/TDAP  05/01/2014  . DEXA SCAN  04/22/2018  . MAMMOGRAM  09/04/2020  . OPHTHALMOLOGY EXAM  10/13/2020  . HEMOGLOBIN A1C  02/08/2021  . URINE MICROALBUMIN  06/16/2021  . COLONOSCOPY (Pts 45-69yrs Insurance coverage will need to be confirmed)  08/14/2024  . INFLUENZA VACCINE  Completed  . PNA vac Low Risk Adult  Completed     Assessment  1. Congestive heart failure with left ventricular systolic dysfunction ([place])   2. Asymptomatic menopausal state   3. Controlled type 2 diabetes mellitus without complication, without long-term current use of insulin ([place])   4. Hypertension associated with diabetes ([place])   5. Mixed hyperlipidemia   6. Spinal stenosis of lumbar region without neurogenic claudication   7. Restless leg syndrome   8. Statin intolerance      Plan   Congestive heart failure: Follow-up with cardiology scheduled for further evaluation, treatment and diagnosis.   Continue Lasix and recheck potassium today.  Recent acute renal injury: Recheck today.  Diabetes: Well-controlled.  Would like to have her on an SGLT2 inhibitor if cardiology agrees and we can get it paid for.  Recheck 6 weeks to discuss this further.  Blood pressures controlled.  Hyperlipidemia with goal LDL less than 70.  We will recheck lipids and start statin trial.  Chronic back pain on Lyrica  Restless leg syndrome is controlled  Health maintenance: Mammogram and DEXA in February ordered  Follow up: 6 weeks for recheck.  Orders Placed This Encounter  Procedures  . DG Bone Density  . Basic metabolic panel  . CBC with Differential/Platelet   No orders of the defined types were placed in this encounter.     We updated and reviewed the patient's past history in detail and it is documented below.  Patient Active Problem List   Diagnosis Date Noted  . Congestive heart failure with left ventricular systolic dysfunction ([place]) 08/24/2020  . Pressure injury of  skin 08/14/2020  . 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 Allergy to statin medication 06/16/2020  . DDD (degenerative disc disease), cervical 04/30/2020  . Statin intolerance 08/22/2019  . Osteoarthritis of left knee 11/05/2018  . Chronic pansinusitis 10/04/2017  . Chronic low back pain 09/05/2017  . Complication of surgical procedure 09/05/2017  . Degenerative lumbar disc 06/04/2015  . S/P lumbar laminectomy 06/04/2015  . Restless leg syndrome 05/13/2014  . Controlled type 2 diabetes mellitus without complication, without long-term current use of insulin ([place]) 04/09/2013    Formatting of this note might be different from the original. New onset 04/2013, doesn't tolerate 1000mg  metformin due to GI upset.   Marland Kitchen Spinal stenosis of lumbar region without neurogenic claudication 09/12/2012    Formatting of this note might be different from the original. Right L4/5   . GERD (gastroesophageal reflux  disease) 05/25/2011    Chronic recurrent heartburn symptoms for 3 or 4 years without weight loss, bleeding or dysphagia. Bonds well to daily PPI.Gatha Mayer, MD, [facility]    . Hemorrhoids, internal, with bleeding 05/25/2011    Visible on 2005 colonoscopy and endoscopy 05/25/2011.   . Obesity 05/25/2011  . Hypertension associated with diabetes ([place]) 11/08/2010  . Chronic allergic rhinitis 08/19/2010  . Mitral valve prolapse 10/10/2008    Formatting of this note might be different from the original. 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 Hepatitis C Screening  Never done  . COVID-19 Vaccine (1) Never done  . FOOT EXAM  Never done  . TETANUS/TDAP  05/01/2014  . DEXA SCAN  04/22/2018  . MAMMOGRAM  09/04/2020  . OPHTHALMOLOGY EXAM  10/13/2020  . HEMOGLOBIN A1C  02/08/2021  . URINE MICROALBUMIN  06/16/2021  . COLONOSCOPY (Pts 45-11yrs Insurance coverage will need to be confirmed)  08/14/2024  . INFLUENZA VACCINE  Completed  . PNA vac Low Risk Adult  Completed   Immunization History  Administered Date(s) Administered  . Fluad Quad(high Dose 65+) 07/01/2020  . Influenza Split 06/01/2010  . Influenza, High Dose Seasonal PF 05/13/2014, 04/24/2015, 04/12/2017, 04/19/2018, 04/24/2019, 06/16/2020  . Influenza, Seasonal, Injecte, Preservative Fre 04/09/2013, 05/03/2016  . Influenza,inj,Quad PF,6+ Mos 05/01/2017  . Influenza-Unspecified 04/03/2012  . Pneumococcal Conjugate-13 05/13/2014  . Pneumococcal Polysaccharide-23 02/15/2011  . Td 08/01/1998  . Tdap 05/01/2004  . Zoster 02/15/2011  . Zoster Recombinat (Shingrix) 04/24/2019, 08/13/2019   Current Meds  Medication Sig  . allopurinol (ZYLOPRIM) 100 MG tablet Take 100 mg by mouth daily.  Marland Kitchen aspirin 81 MG tablet Take 81 mg by mouth daily.  . carvedilol (COREG) 3.125 MG tablet Take 1 tablet (3.125 mg total) by mouth 2 (two) times daily  with a meal.  . cholecalciferol (VITAMIN D) 1000 UNITS tablet Take 1,000 Units by mouth daily.  . cycloSPORINE (RESTASIS) 0.05 % ophthalmic emulsion Place 1 drop into both eyes 2 (two) times daily.  . Dulaglutide 0.75 MG/0.5ML SOPN Inject 0.75 mg into the skin once a week.  . fluticasone (FLONASE) 50 MCG/ACT nasal spray Place 1 spray into both nostrils daily as needed for allergies.  . furosemide (LASIX) 40 MG tablet Take 1 tablet (40 mg total) by mouth daily.  Marland Kitchen glipiZIDE-metformin (METAGLIP) 5-500 MG tablet Take 1 tablet by mouth 2 (two) times daily before a meal.  . Magnesium 250 MG TABS Take 1 tablet by mouth daily.  . Multiple Vitamin (MULTIVITAMIN WITH MINERALS)  TABS tablet Take 1 tablet by mouth daily.  Marland Kitchen omeprazole (PRILOSEC) 40 MG capsule Take 40 mg by mouth daily.  . potassium chloride (KLOR-CON) 10 MEQ tablet Take 2 tablets (20 mEq total) by mouth daily.  . pregabalin (LYRICA) 100 MG capsule Take 100 mg by mouth 2 (two) times daily.  Marland Kitchen rOPINIRole (REQUIP) 2 MG tablet Take 2 mg by mouth at bedtime.   Marland Kitchen zinc sulfate 220 (50 Zn) MG capsule Take 1 capsule (220 mg total) by mouth daily.    Allergies: Patient is allergic to amlodipine, depo-medrol [methylprednisolone], gabapentin, levothyroxine, sitagliptin, ace inhibitors, betadine [povidone iodine], codeine, cortizone-10 [hydrocortisone], tetracyclines & related, and dexamethasone. Past Medical History Patient  has a past medical history of Arthritis, Chronic back pain, Congestive heart failure with left ventricular systolic dysfunction ([place]) (08/24/2020), Diabetes mellitus without complication ([place]), GERD (gastroesophageal reflux disease), History of bronchitis (> 8 yrs ago), History of shingles, HTN (hypertension), colonic polyps, Internal and external hemorrhoids without complication, Joint pain, Mitral valve prolapse, Nocturia, PONV (postoperative nausea and vomiting), Restless leg syndrome, Seasonal allergies, Urinary frequency, and  Uterine fibroid. Past Surgical History Patient  has a past surgical history that includes Hysteroscopy with D & C (08/23/2000); Lumbar disc surgery (03/16/2005); Plantar fascia release (Left, 02/03/2010); Hemorrhoid surgery; Tonsillectomy; Wrist surgery; Tubal ligation; Colonoscopy (07/12/2004); Bunionectomy (Bilateral); Colonoscopy; Lumbar epidural injection; Finger arthroscopy with carpometacarpel(cmc) arthroplasty (Right, 09/28/2015); and Tendon transfer (Right, 09/28/2015). Family History: Patient family history includes Pancreatic cancer in her father. Social History:  Patient  reports that she has never smoked. She has never used smokeless tobacco. She reports that she does not drink alcohol and does not use drugs.  Review of Systems: Constitutional: negative for fever or malaise Ophthalmic: negative for photophobia, double vision or loss of vision Cardiovascular: negative for chest pain, dyspnea on exertion, or new LE swelling Respiratory: negative for SOB or persistent cough Gastrointestinal: negative for abdominal pain, change in bowel habits or melena Genitourinary: negative for dysuria or gross hematuria Musculoskeletal: negative for new gait disturbance or muscular weakness Integumentary: negative for new or persistent rashes Neurological: negative for TIA or stroke symptoms Psychiatric: negative for SI or delusions Allergic/Immunologic: negative for hives  Patient Care Team    Relationship Specialty Notifications Start End  Leamon Arnt, MD PCP - General Family Medicine  08/24/20   Freada Bergeron, MD PCP - Cardiology Cardiology  08/05/20     Objective  Vitals: BP 120/70   Pulse 96   Temp 98 F (36.7 C) (Temporal)   Resp 17   Ht 5' 5.5" (1.664 m)   Wt 188 lb (85.3 kg)   SpO2 97%   BMI 30.81 kg/m  General:  Well developed, well nourished, no acute distress, cannot stand fully erect due to pain no respiratory distress Psych:  Alert and oriented,normal mood and  affect HEENT:  Normocephalic, atraumatic, non-icteric sclera,  Cardiovascular:  RRR without gallop, rub or murmur, no edema Respiratory:  Good breath sounds bilaterally, CTAB with normal respiratory effort Normal foot exam bilaterally +2 distal pulses  Commons side effects, risks, benefits, and alternatives for medications and treatment plan prescribed today were discussed, and the patient expressed understanding of the given instructions. Patient is instructed to call or message via MyChart if he/she has any questions or concerns regarding our treatment plan. No barriers to understanding were identified. We discussed Red Flag symptoms and signs in detail. Patient expressed understanding regarding what to do in case of urgent or emergency type symptoms.  Medication list was reconciled, printed and provided to the patient in AVS. Patient instructions and summary information was reviewed with the patient as documented in the AVS. This note was prepared with assistance of Dragon voice recognition software. Occasional wrong-word or sound-a-like substitutions may have occurred due to the inherent limitations of voice recognition software

## 2020-08-24 NOTE — Patient Instructions (Signed)
It was so good seeing you again! Thank you for establishing with my new practice and allowing me to continue caring for you. It means a lot to me.   Please schedule a follow up appointment with me in 6 weeks for recheck.   We will check your potassium and kidney function today to make sure it is ok.   I have not changed any of your medications today.

## 2020-08-25 ENCOUNTER — Other Ambulatory Visit: Payer: Self-pay

## 2020-08-25 DIAGNOSIS — N179 Acute kidney failure, unspecified: Secondary | ICD-10-CM

## 2020-08-25 NOTE — H&P (View-Only) (Signed)
Cardiology Office Note:    Date:  08/31/2020   ID:  [name], DOB 04-05-1946, MRN [id number]  PCP:  Leamon Arnt, MD  [facility] Cardiologist:  Freada Bergeron, MD  [place] Electrophysiologist:  None   Referring MD: No ref. provider found     History of Present Illness:    [name] is a 75 y.o. 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 She now presents to clinic for follow-up.  Of note, since the last time we saw her, the patient returned to the ER with progressive shortness of breath and LE edema. She was initially in the ICU and required intubation. She was aggressively diuresed and extubated and evenutally weaned to RA. Initial weight was 205lbs and she was diuresed to 187lbs. She was discharged home on lasix 40mg  daily.  Today, the patient states that she continues to feel better each day. She continues to have dyspnea on exertion after walking 142ft or so since being out of the hospital. No chest pain or exertoional chest pressure. No lightheadedness, dizziness or syncope. No LE edema and has been weighing herself every morning and has been stable on lasix 20mg  daily (decreased on last visit with PCP).   Prior to [place], the patient did note increasing dyspnea on exertion which she was concerned about. She was hoping to have a cardiac work-up prior to becoming sick. No chest pain, palpitations, lightheadedness at that time.  Mother with history of CHF. Paternal GF with MI at age 72.   Past Medical History:  Diagnosis Date  . Arthritis   . Chronic back pain    buldging disc,scoliosis,arthritis  . Congestive heart failure with left ventricular systolic dysfunction ([place]) 08/24/2020  . Diabetes mellitus without complication ([place])     takes Trulicity,Jardiance,and Metformin daily.Average fasting blood sugar runs around130  . GERD (gastroesophageal reflux disease)    takes Omeprazole daily  . History of bronchitis > 8 yrs ago  . History of shingles   . HTN (hypertension)    takes Amlodipine and Micardis daily  . Hx of colonic polyps    benign  . Internal and external hemorrhoids without complication   . Joint pain   . Mitral valve prolapse   . Nocturia   . PONV (postoperative nausea and vomiting)    when gets injections in joints gets hives.Betadine rash  . Restless leg syndrome    takes Requip at bedtime  . Seasonal allergies    takes Claritin daily as needed  . Urinary frequency   . Uterine fibroid     Past Surgical History:  Procedure Laterality Date  . BUNIONECTOMY Bilateral   . COLONOSCOPY  07/12/2004   diverticulosis, internal and external hemorrhoids  . COLONOSCOPY    . FINGER ARTHROSCOPY WITH CARPOMETACARPEL [facility]) ARTHROPLASTY Right 09/28/2015   Procedure: RIGHT THUMB TRAPEZIUM EXCISION WITH CARPOMETACARPEL ([place]) ARTHROPLASTY AND TENDON TRANSFER;  Surgeon: Iran Planas, MD;  Location: [place];  Service: Orthopedics;  Laterality: Right;  . HEMORRHOID SURGERY     almost 40 yrs ago  . HYSTEROSCOPY WITH D & C  08/23/2000   and resectoscopic myomectomy  . LUMBAR DISC SURGERY  03/16/2005  . LUMBAR EPIDURAL INJECTION    . PLANTAR FASCIA RELEASE Left 02/03/2010   and torn  tendon  . TENDON TRANSFER Right 09/28/2015   Procedure: TENDON TRANSFER;  Surgeon: Iran Planas, MD;  Location: [place];  Service: Orthopedics;  Laterality: Right;  . TONSILLECTOMY    . TUBAL LIGATION    . WRIST SURGERY     left, removal of cyst    Current Medications: Current Meds  Medication Sig  . allopurinol (ZYLOPRIM) 100 MG tablet Take 100 mg by mouth daily.  Marland Kitchen aspirin 81 MG tablet Take 81 mg by mouth daily.  . carvedilol (COREG) 3.125 MG tablet Take 1 tablet (3.125 mg total) by mouth 2 (two) times daily with a meal.  .  cholecalciferol (VITAMIN D) 1000 UNITS tablet Take 1,000 Units by mouth daily.  . cycloSPORINE (RESTASIS) 0.05 % ophthalmic emulsion Place 1 drop into both eyes 2 (two) times daily.  . Dulaglutide 0.75 MG/0.5ML SOPN Inject 0.75 mg into the skin once a week.  . fluticasone (FLONASE) 50 MCG/ACT nasal spray Place 1 spray into both nostrils daily as needed for allergies.  . furosemide (LASIX) 40 MG tablet Take 1 tablet (40 mg total) by mouth daily.  Marland Kitchen glipiZIDE-metformin (METAGLIP) 5-500 MG tablet Take 1 tablet by mouth 2 (two) times daily before a meal.  . Magnesium 250 MG TABS Take 1 tablet by mouth daily.  . Multiple Vitamin (MULTIVITAMIN WITH MINERALS) TABS tablet Take 1 tablet by mouth daily.  Marland Kitchen omeprazole (PRILOSEC) 40 MG capsule Take 40 mg by mouth daily.  . potassium chloride (KLOR-CON) 10 MEQ tablet Take 2 tablets (20 mEq total) by mouth daily.  . pregabalin (LYRICA) 100 MG capsule Take 100 mg by mouth 2 (two) times daily.  Marland Kitchen rOPINIRole (REQUIP) 2 MG tablet Take 2 mg by mouth at bedtime.   . rosuvastatin (CRESTOR) 20 MG tablet Take 1 tablet (20 mg total) by mouth daily.  Marland Kitchen zinc sulfate 220 (50 Zn) MG capsule Take 1 capsule (220 mg total) by mouth daily.     Allergies:   Amlodipine, Depo-medrol [methylprednisolone], Gabapentin, Levothyroxine, Sitagliptin, Ace inhibitors, Betadine [povidone iodine], Codeine, Cortizone-10 [hydrocortisone], Tetracyclines & related, and Dexamethasone   Social History   Socioeconomic History  . Marital status: Married    Spouse name: Not on file  . Number of children: 3  . Years of education: Not on file  . Highest education level: Not on file  Occupational History  . Occupation: Retired   Tobacco Use  . Smoking status: Never Smoker  . Smokeless tobacco: Never Used  Substance and Sexual Activity  . Alcohol use: No    Alcohol/week: 0.0 standard drinks  . Drug use: No  . Sexual activity: Yes  Other Topics Concern  . Not on file  Social History  Narrative   1 caffeine drinks daily         Social Determinants of Health   Financial Resource Strain: Not on file  Food Insecurity: Not on file  Transportation Needs: Not on file  Physical Activity: Not on file  Stress: Not on file  Social Connections: Not on file     Family History: The patient's family history includes Pancreatic cancer in her father. There is no history of Colon cancer or Breast cancer.  ROS:   Please see the history of present illness.    Review of Systems  Constitutional: Positive for malaise/fatigue. Negative for chills and fever.  HENT: Positive for congestion.   Eyes: Negative for blurred vision.  Respiratory: Positive for shortness of breath.   Cardiovascular: Negative for chest pain, palpitations, orthopnea,  claudication, leg swelling and PND.  Gastrointestinal: Negative for melena, nausea and vomiting.  Genitourinary: Negative for hematuria.  Musculoskeletal: Positive for joint pain.  Neurological: Negative for dizziness and loss of consciousness.  Endo/Heme/Allergies: Negative for polydipsia.  Psychiatric/Behavioral: Negative for substance abuse.    EKGs/Labs/Other Studies Reviewed:    The following studies were reviewed today:  TTE 08/14/20: 1. Left ventricular ejection fraction, by estimation, is 20 to 25%. The  left ventricle has severely decreased function. The left ventricle has no  regional wall motion abnormalities. Left ventricular diastolic parameters  are indeterminate.  2. Right ventricular systolic function was not well visualized. The right  ventricular size is not well visualized.  3. Left atrial size was moderately dilated.  4. The mitral valve is grossly normal. Mild mitral valve regurgitation.  There is a second jet of mitral regurgitation seen in the four chamber  view with eccentric pattern, mitral regurgitation may be underestimated in  this study.  5. The aortic valve is tricuspid. Aortic valve regurgitation is  not  visualized. No aortic stenosis is present.  6. The inferior vena cava is dilated in size with <50% respiratory  variability, suggesting right atrial pressure of 15 mmHg.   TTE 08/04/20 IMPRESSIONS  1. Left ventricular ejection fraction, by estimation, is 20 to 25%. The  left ventricle has severely decreased function. The left ventricle  demonstrates global hypokinesis. The left ventricular internal cavity size  was mildly to moderately dilated. Left  ventricular diastolic parameters are consistent with Grade II diastolic  dysfunction (pseudonormalization). Elevated left atrial pressure.  2. Right ventricular systolic function is normal. The right ventricular  size is normal. There is mildly elevated pulmonary artery systolic  pressure. The estimated right ventricular systolic pressure is [id number] mmHg.  3. Left atrial size was moderately dilated.  4. The mitral valve is degenerative. Mild mitral valve regurgitation. No  evidence of mitral stenosis.  5. The aortic valve is tricuspid. There is mild calcification of the  aortic valve. There is mild thickening of the aortic valve. Aortic valve  regurgitation is not visualized. Mild aortic valve sclerosis is present,  with no evidence of aortic valve  stenosis.  6. The inferior vena cava is dilated in size with <50% respiratory  variability, suggesting right atrial pressure of 15 mmHg.   FINDINGS  Left Ventricle: Left ventricular ejection fraction, by estimation, is 20  to 25%. The left ventricle has severely decreased function. The left  ventricle demonstrates global hypokinesis. The left ventricular internal  cavity size was mildly to moderately  dilated. There is no left ventricular hypertrophy. Left ventricular  diastolic parameters are consistent with Grade II diastolic dysfunction  (pseudonormalization). Elevated left atrial pressure.   Right Ventricle: The right ventricular size is normal. No increase in  right  ventricular wall thickness. Right ventricular systolic function is  normal. There is mildly elevated pulmonary artery systolic pressure. The  tricuspid regurgitant velocity is 2.39  m/s, and with an assumed right atrial pressure of 15 mmHg, the estimated  right ventricular systolic pressure is [id number] mmHg.   Left Atrium: Left atrial size was moderately dilated.   Right Atrium: Right atrial size was normal in size.   Pericardium: Trivial pericardial effusion is present.   Mitral Valve: The mitral valve is degenerative in appearance. Mild mitral  annular calcification. Mild mitral valve regurgitation. No evidence of  mitral valve stenosis.   Tricuspid Valve: The tricuspid valve is grossly normal. Tricuspid valve  regurgitation is mild . No  evidence of tricuspid stenosis.   Aortic Valve: The aortic valve is tricuspid. There is mild calcification  of the aortic valve. There is mild thickening of the aortic valve. Aortic  valve regurgitation is not visualized. Mild aortic valve sclerosis is  present, with no evidence of aortic  valve stenosis.   Pulmonic Valve: The pulmonic valve was grossly normal. Pulmonic valve  regurgitation is trivial. No evidence of pulmonic stenosis.   Aorta: The aortic root and ascending aorta are structurally normal, with  no evidence of dilitation.   Venous: The inferior vena cava is dilated in size with less than 50%  respiratory variability, suggesting right atrial pressure of 15 mmHg.   IAS/Shunts: The atrial septum is grossly normal.    EKG:  EKG 08/16/20: NSR with PVC; no ischemia or block  Recent Labs: 08/03/2020: TSH 2.701 08/16/2020: Magnesium 2.1 08/19/2020: B Natriuretic Peptide 146.3 08/20/2020: ALT 45 08/24/2020: BUN 45; Creatinine, Ser 1.55; Hemoglobin 12.3; Platelets 447.0; Potassium 4.7; Sodium 136  Recent Lipid Panel    Component Value Date/Time   TRIG 194 (H) 08/13/2020 1035      Physical Exam:    VS:  BP 114/80   Pulse 95   Ht  5\' 4"  (1.626 m)   Wt 189 lb 3.2 oz (85.8 kg)   SpO2 99%   BMI 32.48 kg/m     Wt Readings from Last 3 Encounters:  08/31/20 189 lb 3.2 oz (85.8 kg)  08/24/20 188 lb (85.3 kg)  08/21/20 187 lb 9.8 oz (85.1 kg)     GEN:  Well nourished, well developed in no acute distress HEENT: Normal NECK: No JVD; No carotid bruits CARDIAC: RRR, no murmurs, rubs, gallops RESPIRATORY:  Clear to auscultation without rales, wheezing or rhonchi  ABDOMEN: Soft, non-tender, non-distended MUSCULOSKELETAL:  No edema; No deformity. Warm. SKIN: Warm and dry NEUROLOGIC:  Alert and oriented x 3 PSYCHIATRIC:  Normal affect   ASSESSMENT:    1. HFrEF (heart failure with reduced ejection fraction) ([place])   2. Acute kidney injury (AKI) with acute tubular necrosis (ATN) ([facility])   3. Controlled type 2 diabetes mellitus without complication, without long-term current use of insulin ([place])   4. COVID-19    PLAN:    In order of problems listed above:  #Newly diagnosed HFrEF with LVEF 20-25%:  Diagnosed in the setting of COVID-19 pneumonia. TTE with globally depressed systolic function. Trop only mildly elevated. No known history of CAD, however patient was concerned about worsening dyspnea on exertion prior to becoming sick. Would ideally like to pursue ischemic work-up with coronary angiography, however, will ensure renal function is improved/close to baseline. -Repeat BMET today -If improved/near baseline, will plan for coronary angiography with minimal dye use as possible -Given age, concerned that coronary CTA may demonstrate too much calcification to obtain clear images but can pursue as back up modality vs myoview -Continue lasix 20mg  daily -Continue coreg 3.125mg  BID -Continue ASA; will start crestor 20mg  for now and adjust as needed pending cath findings -Once renal function improves, ideally would like to start entresto and spironolactone -If no improvement in LVEF with medications and +/- revascularization  (if needed), will need ICD in future -Continue daily weights and call if gaining >2-3lbs in 2 days or 5lbs in 1 week -Low Na diet  #AKI on CKD: -Repeat BMET today -Agree with reduced dose of lasix -Follow-up with PCP as scheduled  #DMII: -Add farxiga as able if GFR improved  [id number] Pneumonia: Complicated by profound hypoxia likely  compounded by pulmonary edema requiring re-hospitalization as detailed above. Required intubation and was diuresed successfuly and weaned to RA. -Improving -Continue symptomatic management  Will see back in 3 weeks to add medications as tolerated pending renal function.  Discussed coronary angiography if labs show improved Cr and patient amenable to pursue.  Shared Decision Making/Informed Consent The risks [stroke (1 in 1000), death (1 in 1000), kidney failure [usually temporary] (1 in 500), bleeding (1 in 200), allergic reaction [possibly serious] (1 in 200)], benefits (diagnostic support and management of coronary artery disease) and alternatives of a cardiac catheterization were discussed in detail with Ms. Kisiel and she is willing to proceed.   Medication Adjustments/Labs and Tests Ordered: Current medicines are reviewed at length with the patient today.  Concerns regarding medicines are outlined above.  Orders Placed This Encounter  Procedures  . Basic metabolic panel  . Hepatic function panel  . Lipid panel   Meds ordered this encounter  Medications  . rosuvastatin (CRESTOR) 20 MG tablet    Sig: Take 1 tablet (20 mg total) by mouth daily.    Dispense:  90 tablet    Refill:  3    Patient Instructions  Medication Instructions:  1) START CRESTOR (rosuvastatin) 20 mg daily *If you need a refill on your cardiac medications before your next appointment, please call your pharmacy*  Lab Work: TODAY! BMET If you have labs (blood work) drawn today and your tests are completely normal, you will receive your results only by: Marland Kitchen MyChart Message  (if you have MyChart) OR . A paper copy in the mail If you have any lab test that is abnormal or we need to change your treatment, we will call you to review the results.  Follow-Up: Your provider recommends that you schedule a follow-up appointment in 3 WEEKS.    Signed, Freada Bergeron, MD  08/31/2020 11:03 AM    [place]

## 2020-08-25 NOTE — Progress Notes (Signed)
Please call patient: I have reviewed his/her lab results.  Kidney function is mildly worse since discharge.  Recommend monitoring weights closely.  Would like to be able to decrease Lasix dose to 20 mg daily with 1 potassium supplement if able.  She can discuss this further with cardiology.  I recommend rechecking renal function again in 2 weeks.  Please order lab visit with BMP, diagnosis acute kidney injury.  Thanks

## 2020-08-25 NOTE — Progress Notes (Signed)
Cardiology Office Note:    Date:  08/31/2020   ID:  [name], DOB 04-05-1946, MRN [id number]  PCP:  Leamon Arnt, MD  [facility] Cardiologist:  Freada Bergeron, MD  [place] Electrophysiologist:  None   Referring MD: No ref. provider found     History of Present Illness:    [name] is a 75 y.o. 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 She now presents to clinic for follow-up.  Of note, since the last time we saw her, the patient returned to the ER with progressive shortness of breath and LE edema. She was initially in the ICU and required intubation. She was aggressively diuresed and extubated and evenutally weaned to RA. Initial weight was 205lbs and she was diuresed to 187lbs. She was discharged home on lasix 40mg  daily.  Today, the patient states that she continues to feel better each day. She continues to have dyspnea on exertion after walking 142ft or so since being out of the hospital. No chest pain or exertoional chest pressure. No lightheadedness, dizziness or syncope. No LE edema and has been weighing herself every morning and has been stable on lasix 20mg  daily (decreased on last visit with PCP).   Prior to [place], the patient did note increasing dyspnea on exertion which she was concerned about. She was hoping to have a cardiac work-up prior to becoming sick. No chest pain, palpitations, lightheadedness at that time.  Mother with history of CHF. Paternal GF with MI at age 72.   Past Medical History:  Diagnosis Date  . Arthritis   . Chronic back pain    buldging disc,scoliosis,arthritis  . Congestive heart failure with left ventricular systolic dysfunction ([place]) 08/24/2020  . Diabetes mellitus without complication ([place])     takes Trulicity,Jardiance,and Metformin daily.Average fasting blood sugar runs around130  . GERD (gastroesophageal reflux disease)    takes Omeprazole daily  . History of bronchitis > 8 yrs ago  . History of shingles   . HTN (hypertension)    takes Amlodipine and Micardis daily  . Hx of colonic polyps    benign  . Internal and external hemorrhoids without complication   . Joint pain   . Mitral valve prolapse   . Nocturia   . PONV (postoperative nausea and vomiting)    when gets injections in joints gets hives.Betadine rash  . Restless leg syndrome    takes Requip at bedtime  . Seasonal allergies    takes Claritin daily as needed  . Urinary frequency   . Uterine fibroid     Past Surgical History:  Procedure Laterality Date  . BUNIONECTOMY Bilateral   . COLONOSCOPY  07/12/2004   diverticulosis, internal and external hemorrhoids  . COLONOSCOPY    . FINGER ARTHROSCOPY WITH CARPOMETACARPEL [facility]) ARTHROPLASTY Right 09/28/2015   Procedure: RIGHT THUMB TRAPEZIUM EXCISION WITH CARPOMETACARPEL ([place]) ARTHROPLASTY AND TENDON TRANSFER;  Surgeon: Iran Planas, MD;  Location: [place];  Service: Orthopedics;  Laterality: Right;  . HEMORRHOID SURGERY     almost 40 yrs ago  . HYSTEROSCOPY WITH D & C  08/23/2000   and resectoscopic myomectomy  . LUMBAR DISC SURGERY  03/16/2005  . LUMBAR EPIDURAL INJECTION    . PLANTAR FASCIA RELEASE Left 02/03/2010   and torn  tendon  . TENDON TRANSFER Right 09/28/2015   Procedure: TENDON TRANSFER;  Surgeon: Iran Planas, MD;  Location: [place];  Service: Orthopedics;  Laterality: Right;  . TONSILLECTOMY    . TUBAL LIGATION    . WRIST SURGERY     left, removal of cyst    Current Medications: Current Meds  Medication Sig  . allopurinol (ZYLOPRIM) 100 MG tablet Take 100 mg by mouth daily.  Marland Kitchen aspirin 81 MG tablet Take 81 mg by mouth daily.  . carvedilol (COREG) 3.125 MG tablet Take 1 tablet (3.125 mg total) by mouth 2 (two) times daily with a meal.  .  cholecalciferol (VITAMIN D) 1000 UNITS tablet Take 1,000 Units by mouth daily.  . cycloSPORINE (RESTASIS) 0.05 % ophthalmic emulsion Place 1 drop into both eyes 2 (two) times daily.  . Dulaglutide 0.75 MG/0.5ML SOPN Inject 0.75 mg into the skin once a week.  . fluticasone (FLONASE) 50 MCG/ACT nasal spray Place 1 spray into both nostrils daily as needed for allergies.  . furosemide (LASIX) 40 MG tablet Take 1 tablet (40 mg total) by mouth daily.  Marland Kitchen glipiZIDE-metformin (METAGLIP) 5-500 MG tablet Take 1 tablet by mouth 2 (two) times daily before a meal.  . Magnesium 250 MG TABS Take 1 tablet by mouth daily.  . Multiple Vitamin (MULTIVITAMIN WITH MINERALS) TABS tablet Take 1 tablet by mouth daily.  Marland Kitchen omeprazole (PRILOSEC) 40 MG capsule Take 40 mg by mouth daily.  . potassium chloride (KLOR-CON) 10 MEQ tablet Take 2 tablets (20 mEq total) by mouth daily.  . pregabalin (LYRICA) 100 MG capsule Take 100 mg by mouth 2 (two) times daily.  Marland Kitchen rOPINIRole (REQUIP) 2 MG tablet Take 2 mg by mouth at bedtime.   . rosuvastatin (CRESTOR) 20 MG tablet Take 1 tablet (20 mg total) by mouth daily.  Marland Kitchen zinc sulfate 220 (50 Zn) MG capsule Take 1 capsule (220 mg total) by mouth daily.     Allergies:   Amlodipine, Depo-medrol [methylprednisolone], Gabapentin, Levothyroxine, Sitagliptin, Ace inhibitors, Betadine [povidone iodine], Codeine, Cortizone-10 [hydrocortisone], Tetracyclines & related, and Dexamethasone   Social History   Socioeconomic History  . Marital status: Married    Spouse name: Not on file  . Number of children: 3  . Years of education: Not on file  . Highest education level: Not on file  Occupational History  . Occupation: Retired   Tobacco Use  . Smoking status: Never Smoker  . Smokeless tobacco: Never Used  Substance and Sexual Activity  . Alcohol use: No    Alcohol/week: 0.0 standard drinks  . Drug use: No  . Sexual activity: Yes  Other Topics Concern  . Not on file  Social History  Narrative   1 caffeine drinks daily         Social Determinants of Health   Financial Resource Strain: Not on file  Food Insecurity: Not on file  Transportation Needs: Not on file  Physical Activity: Not on file  Stress: Not on file  Social Connections: Not on file     Family History: The patient's family history includes Pancreatic cancer in her father. There is no history of Colon cancer or Breast cancer.  ROS:   Please see the history of present illness.    Review of Systems  Constitutional: Positive for malaise/fatigue. Negative for chills and fever.  HENT: Positive for congestion.   Eyes: Negative for blurred vision.  Respiratory: Positive for shortness of breath.   Cardiovascular: Negative for chest pain, palpitations, orthopnea,  claudication, leg swelling and PND.  Gastrointestinal: Negative for melena, nausea and vomiting.  Genitourinary: Negative for hematuria.  Musculoskeletal: Positive for joint pain.  Neurological: Negative for dizziness and loss of consciousness.  Endo/Heme/Allergies: Negative for polydipsia.  Psychiatric/Behavioral: Negative for substance abuse.    EKGs/Labs/Other Studies Reviewed:    The following studies were reviewed today:  TTE 08/14/20: 1. Left ventricular ejection fraction, by estimation, is 20 to 25%. The  left ventricle has severely decreased function. The left ventricle has no  regional wall motion abnormalities. Left ventricular diastolic parameters  are indeterminate.  2. Right ventricular systolic function was not well visualized. The right  ventricular size is not well visualized.  3. Left atrial size was moderately dilated.  4. The mitral valve is grossly normal. Mild mitral valve regurgitation.  There is a second jet of mitral regurgitation seen in the four chamber  view with eccentric pattern, mitral regurgitation may be underestimated in  this study.  5. The aortic valve is tricuspid. Aortic valve regurgitation is  not  visualized. No aortic stenosis is present.  6. The inferior vena cava is dilated in size with <50% respiratory  variability, suggesting right atrial pressure of 15 mmHg.   TTE 08/04/20 IMPRESSIONS  1. Left ventricular ejection fraction, by estimation, is 20 to 25%. The  left ventricle has severely decreased function. The left ventricle  demonstrates global hypokinesis. The left ventricular internal cavity size  was mildly to moderately dilated. Left  ventricular diastolic parameters are consistent with Grade II diastolic  dysfunction (pseudonormalization). Elevated left atrial pressure.  2. Right ventricular systolic function is normal. The right ventricular  size is normal. There is mildly elevated pulmonary artery systolic  pressure. The estimated right ventricular systolic pressure is 37.8 mmHg.  3. Left atrial size was moderately dilated.  4. The mitral valve is degenerative. Mild mitral valve regurgitation. No  evidence of mitral stenosis.  5. The aortic valve is tricuspid. There is mild calcification of the  aortic valve. There is mild thickening of the aortic valve. Aortic valve  regurgitation is not visualized. Mild aortic valve sclerosis is present,  with no evidence of aortic valve  stenosis.  6. The inferior vena cava is dilated in size with <50% respiratory  variability, suggesting right atrial pressure of 15 mmHg.   FINDINGS  Left Ventricle: Left ventricular ejection fraction, by estimation, is 20  to 25%. The left ventricle has severely decreased function. The left  ventricle demonstrates global hypokinesis. The left ventricular internal  cavity size was mildly to moderately  dilated. There is no left ventricular hypertrophy. Left ventricular  diastolic parameters are consistent with Grade II diastolic dysfunction  (pseudonormalization). Elevated left atrial pressure.   Right Ventricle: The right ventricular size is normal. No increase in  right  ventricular wall thickness. Right ventricular systolic function is  normal. There is mildly elevated pulmonary artery systolic pressure. The  tricuspid regurgitant velocity is 2.39  m/s, and with an assumed right atrial pressure of 15 mmHg, the estimated  right ventricular systolic pressure is 37.8 mmHg.   Left Atrium: Left atrial size was moderately dilated.   Right Atrium: Right atrial size was normal in size.   Pericardium: Trivial pericardial effusion is present.   Mitral Valve: The mitral valve is degenerative in appearance. Mild mitral  annular calcification. Mild mitral valve regurgitation. No evidence of  mitral valve stenosis.   Tricuspid Valve: The tricuspid valve is grossly normal. Tricuspid valve  regurgitation is mild . No   evidence of tricuspid stenosis.   Aortic Valve: The aortic valve is tricuspid. There is mild calcification  of the aortic valve. There is mild thickening of the aortic valve. Aortic  valve regurgitation is not visualized. Mild aortic valve sclerosis is  present, with no evidence of aortic  valve stenosis.   Pulmonic Valve: The pulmonic valve was grossly normal. Pulmonic valve  regurgitation is trivial. No evidence of pulmonic stenosis.   Aorta: The aortic root and ascending aorta are structurally normal, with  no evidence of dilitation.   Venous: The inferior vena cava is dilated in size with less than 50%  respiratory variability, suggesting right atrial pressure of 15 mmHg.   IAS/Shunts: The atrial septum is grossly normal.    EKG:  EKG 08/16/20: NSR with PVC; no ischemia or block  Recent Labs: 08/03/2020: TSH 2.701 08/16/2020: Magnesium 2.1 08/19/2020: B Natriuretic Peptide 146.3 08/20/2020: ALT 45 08/24/2020: BUN 45; Creatinine, Ser 1.55; Hemoglobin 12.3; Platelets 447.0; Potassium 4.7; Sodium 136  Recent Lipid Panel    Component Value Date/Time   TRIG 194 (H) 08/13/2020 1035      Physical Exam:    VS:  BP 114/80   Pulse 95   Ht  5\' 4"  (1.626 m)   Wt 189 lb 3.2 oz (85.8 kg)   SpO2 99%   BMI 32.48 kg/m     Wt Readings from Last 3 Encounters:  08/31/20 189 lb 3.2 oz (85.8 kg)  08/24/20 188 lb (85.3 kg)  08/21/20 187 lb 9.8 oz (85.1 kg)     GEN:  Well nourished, well developed in no acute distress HEENT: Normal NECK: No JVD; No carotid bruits CARDIAC: RRR, no murmurs, rubs, gallops RESPIRATORY:  Clear to auscultation without rales, wheezing or rhonchi  ABDOMEN: Soft, non-tender, non-distended MUSCULOSKELETAL:  No edema; No deformity. Warm. SKIN: Warm and dry NEUROLOGIC:  Alert and oriented x 3 PSYCHIATRIC:  Normal affect   ASSESSMENT:    1. HFrEF (heart failure with reduced ejection fraction) ([place])   2. Acute kidney injury (AKI) with acute tubular necrosis (ATN) ([facility])   3. Controlled type 2 diabetes mellitus without complication, without long-term current use of insulin ([place])   4. COVID-19    PLAN:    In order of problems listed above:  #Newly diagnosed HFrEF with LVEF 20-25%:  Diagnosed in the setting of COVID-19 pneumonia. TTE with globally depressed systolic function. Trop only mildly elevated. No known history of CAD, however patient was concerned about worsening dyspnea on exertion prior to becoming sick. Would ideally like to pursue ischemic work-up with coronary angiography, however, will ensure renal function is improved/close to baseline. -Repeat BMET today -If improved/near baseline, will plan for coronary angiography with minimal dye use as possible -Given age, concerned that coronary CTA may demonstrate too much calcification to obtain clear images but can pursue as back up modality vs myoview -Continue lasix 20mg  daily -Continue coreg 3.125mg  BID -Continue ASA; will start crestor 20mg  for now and adjust as needed pending cath findings -Once renal function improves, ideally would like to start entresto and spironolactone -If no improvement in LVEF with medications and +/- revascularization  (if needed), will need ICD in future -Continue daily weights and call if gaining >2-3lbs in 2 days or 5lbs in 1 week -Low Na diet  #AKI on CKD: -Repeat BMET today -Agree with reduced dose of lasix -Follow-up with PCP as scheduled  #DMII: -Add farxiga as able if GFR improved  [id number] Pneumonia: Complicated by profound hypoxia likely  compounded by pulmonary edema requiring re-hospitalization as detailed above. Required intubation and was diuresed successfuly and weaned to RA. -Improving -Continue symptomatic management  Will see back in 3 weeks to add medications as tolerated pending renal function.  Discussed coronary angiography if labs show improved Cr and patient amenable to pursue.  Shared Decision Making/Informed Consent The risks [stroke (1 in 1000), death (1 in 1000), kidney failure [usually temporary] (1 in 500), bleeding (1 in 200), allergic reaction [possibly serious] (1 in 200)], benefits (diagnostic support and management of coronary artery disease) and alternatives of a cardiac catheterization were discussed in detail with Ms. Cliett and she is willing to proceed.   Medication Adjustments/Labs and Tests Ordered: Current medicines are reviewed at length with the patient today.  Concerns regarding medicines are outlined above.  Orders Placed This Encounter  Procedures  . Basic metabolic panel  . Hepatic function panel  . Lipid panel   Meds ordered this encounter  Medications  . rosuvastatin (CRESTOR) 20 MG tablet    Sig: Take 1 tablet (20 mg total) by mouth daily.    Dispense:  90 tablet    Refill:  3    Patient Instructions  Medication Instructions:  1) START CRESTOR (rosuvastatin) 20 mg daily *If you need a refill on your cardiac medications before your next appointment, please call your pharmacy*  Lab Work: TODAY! BMET If you have labs (blood work) drawn today and your tests are completely normal, you will receive your results only by: Marland Kitchen MyChart Message  (if you have MyChart) OR . A paper copy in the mail If you have any lab test that is abnormal or we need to change your treatment, we will call you to review the results.  Follow-Up: Your provider recommends that you schedule a follow-up appointment in 3 WEEKS.    Signed, Freada Bergeron, MD  08/31/2020 11:03 AM    [place]

## 2020-08-31 ENCOUNTER — Other Ambulatory Visit: Payer: Self-pay

## 2020-08-31 ENCOUNTER — Encounter: Payer: Self-pay | Admitting: Cardiology

## 2020-08-31 ENCOUNTER — Ambulatory Visit: Payer: Medicare Other | Admitting: Cardiology

## 2020-08-31 VITALS — BP 114/80 | HR 95 | Ht 64.0 in | Wt 189.2 lb

## 2020-08-31 DIAGNOSIS — U071 COVID-19: Secondary | ICD-10-CM | POA: Diagnosis not present

## 2020-08-31 DIAGNOSIS — E119 Type 2 diabetes mellitus without complications: Secondary | ICD-10-CM

## 2020-08-31 DIAGNOSIS — I5020 Unspecified systolic (congestive) heart failure: Secondary | ICD-10-CM | POA: Diagnosis not present

## 2020-08-31 DIAGNOSIS — N170 Acute kidney failure with tubular necrosis: Secondary | ICD-10-CM | POA: Diagnosis not present

## 2020-08-31 MED ORDER — ROSUVASTATIN CALCIUM 20 MG PO TABS: 20.0000 mg | ORAL_TABLET | Freq: Every day | ORAL | 3 refills | Status: DC

## 2020-08-31 NOTE — Patient Instructions (Signed)
Medication Instructions:  1) START CRESTOR (rosuvastatin) 20 mg daily *If you need a refill on your cardiac medications before your next appointment, please call your pharmacy*  Lab Work: TODAY! BMET If you have labs (blood work) drawn today and your tests are completely normal, you will receive your results only by: Marland Kitchen MyChart Message (if you have MyChart) OR . A paper copy in the mail If you have any lab test that is abnormal or we need to change your treatment, we will call you to review the results.  Follow-Up: Your provider recommends that you schedule a follow-up appointment in 3 WEEKS.

## 2020-09-01 ENCOUNTER — Telehealth: Payer: Self-pay

## 2020-09-01 LAB — BASIC METABOLIC PANEL
BUN/Creatinine Ratio: 21 (ref 12–28)
BUN: 24 mg/dL (ref 8–27)
CO2: 22 mmol/L (ref 20–29)
Calcium: 10.2 mg/dL (ref 8.7–10.3)
Chloride: 103 mmol/L (ref 96–106)
Creatinine, Ser: 1.13 mg/dL — ABNORMAL HIGH (ref 0.57–1.00)
GFR calc Af Amer: 55 mL/min/{1.73_m2} — ABNORMAL LOW (ref >59)
GFR calc non Af Amer: 48 mL/min/{1.73_m2} — ABNORMAL LOW (ref >59)
Glucose: 177 mg/dL — ABNORMAL HIGH (ref 65–99)
Potassium: 5.2 mmol/L (ref 3.5–5.2)
Sodium: 142 mmol/L (ref 134–144)

## 2020-09-01 NOTE — Telephone Encounter (Signed)
-----   Message from Freada Bergeron, MD sent at 09/01/2020  8:14 AM EST ----- Her kidney function is back to baseline. This is fantastic news. Can we get her scheduled for a left and right heart catheterization. I will call the patient and let her know and put in the attestation!

## 2020-09-01 NOTE — Telephone Encounter (Signed)
Left message to call back  

## 2020-09-01 NOTE — Telephone Encounter (Signed)
Reviewed results with patient who verbalized understanding.   Scheduled the patient for [facility] 09/07/20 with Dr. Saunders Revel.  GFR 48 = no hydration needed per protocol. Covid test not needed (patient was positive 08/03/2020). Reviewed instructions with patient and she has no questions. She was grateful for call and agrees with plan.

## 2020-09-01 NOTE — Telephone Encounter (Signed)
Pt returning call

## 2020-09-02 ENCOUNTER — Encounter: Payer: Self-pay | Admitting: *Deleted

## 2020-09-02 NOTE — Progress Notes (Signed)
Patient ID: [name], female   DOB: 1946-05-18, 75 y.o.   MRN: [id number] Patient enrolled for [employer] to ship a 30 day cardiac event monitor to her home. Letter with instructions mailed to patient.

## 2020-09-03 ENCOUNTER — Telehealth: Payer: Self-pay | Admitting: *Deleted

## 2020-09-03 NOTE — Telephone Encounter (Signed)
Pt contacted pre-catheterization scheduled at [facility] for: Monday September 07, 2020 12 Noon Verified arrival time and place: [facility]) at: 10 AM   No solid food after midnight prior to cath, clear liquids until 5 AM day of procedure.  Hold: Lasix/KCl-day before and day of procedure-GFR 48 Glipizide/Metformin (Metaglip) -day of procedure and 48 hours post procedure  Except hold medications AM meds can be  taken pre-cath with sips of water including: ASA 81 mg   Confirmed patient has responsible adult to drive home post procedure and be with patient first 24 hours after arriving home: yes  You are allowed ONE visitor in the waiting room during the time you are at the hospital for your procedure. Both you and your visitor must wear a mask once you enter the hospital.  Reviewed procedure/maskvisitor instructions with patient.  08/03/20 COVID-19 positive, pt reports no symptoms concerning for COVID-19.

## 2020-09-07 ENCOUNTER — Encounter (HOSPITAL_COMMUNITY)
Admission: RE | Disposition: A | Discharge status: Home / Self Care | Payer: Self-pay | Source: Home / Self Care | Attending: Internal Medicine

## 2020-09-07 ENCOUNTER — Ambulatory Visit (HOSPITAL_COMMUNITY)
Admission: RE | Admit: 2020-09-07 | Discharge: 2020-09-07 | Disposition: A | Discharge status: Home / Self Care | Payer: Medicare Other | Attending: Internal Medicine | Admitting: Internal Medicine

## 2020-09-07 ENCOUNTER — Other Ambulatory Visit: Payer: Medicare Other

## 2020-09-07 DIAGNOSIS — E1122 Type 2 diabetes mellitus with diabetic chronic kidney disease: Secondary | ICD-10-CM | POA: Insufficient documentation

## 2020-09-07 DIAGNOSIS — I130 Hypertensive heart and chronic kidney disease with heart failure and stage 1 through stage 4 chronic kidney disease, or unspecified chronic kidney disease: Secondary | ICD-10-CM | POA: Insufficient documentation

## 2020-09-07 DIAGNOSIS — N183 Chronic kidney disease, stage 3 unspecified: Secondary | ICD-10-CM | POA: Diagnosis not present

## 2020-09-07 DIAGNOSIS — Z7982 Long term (current) use of aspirin: Secondary | ICD-10-CM | POA: Insufficient documentation

## 2020-09-07 DIAGNOSIS — I5021 Acute systolic (congestive) heart failure: Secondary | ICD-10-CM | POA: Diagnosis not present

## 2020-09-07 DIAGNOSIS — Z8616 Personal history of COVID-19: Secondary | ICD-10-CM | POA: Diagnosis not present

## 2020-09-07 DIAGNOSIS — Z7984 Long term (current) use of oral hypoglycemic drugs: Secondary | ICD-10-CM | POA: Insufficient documentation

## 2020-09-07 DIAGNOSIS — Z885 Allergy status to narcotic agent status: Secondary | ICD-10-CM | POA: Diagnosis not present

## 2020-09-07 DIAGNOSIS — N170 Acute kidney failure with tubular necrosis: Secondary | ICD-10-CM | POA: Insufficient documentation

## 2020-09-07 DIAGNOSIS — Z79899 Other long term (current) drug therapy: Secondary | ICD-10-CM | POA: Insufficient documentation

## 2020-09-07 DIAGNOSIS — Z888 Allergy status to other drugs, medicaments and biological substances status: Secondary | ICD-10-CM | POA: Insufficient documentation

## 2020-09-07 HISTORY — PX: RIGHT/LEFT HEART CATH AND CORONARY ANGIOGRAPHY: CATH118266

## 2020-09-07 LAB — POCT I-STAT EG7
Acid-Base Excess: 2 mmol/L (ref 0.0–2.0)
Bicarbonate: 27.9 mmol/L (ref 20.0–28.0)
Calcium, Ion: 1.29 mmol/L (ref 1.15–1.40)
HCT: 32 % — ABNORMAL LOW (ref 36.0–46.0)
Hemoglobin: 10.9 g/dL — ABNORMAL LOW (ref 12.0–15.0)
O2 Saturation: 68 %
Potassium: 4.1 mmol/L (ref 3.5–5.1)
Sodium: 141 mmol/L (ref 135–145)
TCO2: 29 mmol/L (ref 22–32)
pCO2, Ven: 47.7 mmHg (ref 44.0–60.0)
pH, Ven: 7.375 (ref 7.250–7.430)
pO2, Ven: 37 mmHg (ref 32.0–45.0)

## 2020-09-07 LAB — POCT I-STAT 7, (LYTES, BLD GAS, ICA,H+H)
Acid-Base Excess: 1 mmol/L (ref 0.0–2.0)
Bicarbonate: 26.8 mmol/L (ref 20.0–28.0)
Calcium, Ion: 1.26 mmol/L (ref 1.15–1.40)
HCT: 32 % — ABNORMAL LOW (ref 36.0–46.0)
Hemoglobin: 10.9 g/dL — ABNORMAL LOW (ref 12.0–15.0)
O2 Saturation: 93 %
Potassium: 4.1 mmol/L (ref 3.5–5.1)
Sodium: 141 mmol/L (ref 135–145)
TCO2: 28 mmol/L (ref 22–32)
pCO2 arterial: 44.2 mmHg (ref 32.0–48.0)
pH, Arterial: 7.39 (ref 7.350–7.450)
pO2, Arterial: 67 mmHg — ABNORMAL LOW (ref 83.0–108.0)

## 2020-09-07 LAB — GLUCOSE, CAPILLARY
Glucose-Capillary: 133 mg/dL — ABNORMAL HIGH (ref 70–99)
Glucose-Capillary: 89 mg/dL (ref 70–99)

## 2020-09-07 SURGERY — RIGHT/LEFT HEART CATH AND CORONARY ANGIOGRAPHY
Anesthesia: LOCAL

## 2020-09-07 MED ORDER — FENTANYL CITRATE (PF) 100 MCG/2ML IJ SOLN
INTRAMUSCULAR | Status: DC | PRN
  Administered 2020-09-07: 25 ug via INTRAVENOUS

## 2020-09-07 MED ORDER — SODIUM CHLORIDE 0.9% FLUSH: 3.0000 mL | Freq: Two times a day (BID) | INTRAVENOUS | Status: DC

## 2020-09-07 MED ORDER — HEPARIN (PORCINE) IN NACL 1000-0.9 UT/500ML-% IV SOLN
INTRAVENOUS | Status: AC
  Filled 2020-09-07: qty 1000

## 2020-09-07 MED ORDER — FENTANYL CITRATE (PF) 100 MCG/2ML IJ SOLN
INTRAMUSCULAR | Status: AC
  Filled 2020-09-07: qty 2

## 2020-09-07 MED ORDER — HEPARIN (PORCINE) IN NACL 1000-0.9 UT/500ML-% IV SOLN
INTRAVENOUS | Status: DC | PRN
  Administered 2020-09-07 (×2): 500 mL

## 2020-09-07 MED ORDER — HEPARIN SODIUM (PORCINE) 1000 UNIT/ML IJ SOLN
INTRAMUSCULAR | Status: AC
  Filled 2020-09-07: qty 1

## 2020-09-07 MED ORDER — HEPARIN SODIUM (PORCINE) 1000 UNIT/ML IJ SOLN
INTRAMUSCULAR | Status: DC | PRN
  Administered 2020-09-07: 4500 [IU] via INTRAVENOUS

## 2020-09-07 MED ORDER — SODIUM CHLORIDE 0.9% FLUSH: 3.0000 mL | INTRAVENOUS | Status: DC | PRN

## 2020-09-07 MED ORDER — MIDAZOLAM HCL 2 MG/2ML IJ SOLN
INTRAMUSCULAR | Status: DC | PRN
  Administered 2020-09-07: 1 mg via INTRAVENOUS

## 2020-09-07 MED ORDER — SODIUM CHLORIDE 0.9 % IV SOLN
250.0000 mL | INTRAVENOUS | Status: DC | PRN
Start: 2020-09-07 — End: 2020-09-07

## 2020-09-07 MED ORDER — IOHEXOL 350 MG/ML SOLN
INTRAVENOUS | Status: DC | PRN
  Administered 2020-09-07: 65 mL via INTRA_ARTERIAL

## 2020-09-07 MED ORDER — SODIUM CHLORIDE 0.9 % IV SOLN: INTRAVENOUS | Status: DC

## 2020-09-07 MED ORDER — LIDOCAINE HCL (PF) 1 % IJ SOLN
INTRAMUSCULAR | Status: AC
  Filled 2020-09-07: qty 30

## 2020-09-07 MED ORDER — ACETAMINOPHEN 325 MG PO TABS: 650.0000 mg | ORAL_TABLET | ORAL | Status: DC | PRN

## 2020-09-07 MED ORDER — LIDOCAINE HCL (PF) 1 % IJ SOLN
INTRAMUSCULAR | Status: DC | PRN
  Administered 2020-09-07 (×2): 2 mL

## 2020-09-07 MED ORDER — SODIUM CHLORIDE 0.9% FLUSH
3.0000 mL | INTRAVENOUS | Status: DC | PRN
Start: 2020-09-07 — End: 2020-09-07

## 2020-09-07 MED ORDER — SODIUM CHLORIDE 0.9 % IV SOLN: 250.0000 mL | INTRAVENOUS | Status: DC | PRN

## 2020-09-07 MED ORDER — ONDANSETRON HCL 4 MG/2ML IJ SOLN: 4.0000 mg | Freq: Four times a day (QID) | INTRAMUSCULAR | Status: DC | PRN

## 2020-09-07 MED ORDER — VERAPAMIL HCL 2.5 MG/ML IV SOLN
INTRAVENOUS | Status: DC | PRN
  Administered 2020-09-07: 10 mL via INTRA_ARTERIAL

## 2020-09-07 MED ORDER — MIDAZOLAM HCL 2 MG/2ML IJ SOLN
INTRAMUSCULAR | Status: AC
  Filled 2020-09-07: qty 2

## 2020-09-07 MED ORDER — ASPIRIN 81 MG PO CHEW: 81.0000 mg | CHEWABLE_TABLET | ORAL | Status: DC

## 2020-09-07 MED ORDER — VERAPAMIL HCL 2.5 MG/ML IV SOLN
INTRAVENOUS | Status: AC
  Filled 2020-09-07: qty 2

## 2020-09-07 MED ORDER — HYDRALAZINE HCL 20 MG/ML IJ SOLN: 10.0000 mg | INTRAMUSCULAR | Status: DC | PRN

## 2020-09-07 SURGICAL SUPPLY — 15 items
BAG SNAP BAND KOVER 36X36 (MISCELLANEOUS) ×2 IMPLANT
CATH 5FR JL3.5 JR4 ANG PIG MP (CATHETERS) ×2 IMPLANT
CATH BALLN WEDGE 5F 110CM (CATHETERS) ×2 IMPLANT
COVER DOME SNAP 22 D (MISCELLANEOUS) ×2 IMPLANT
DEVICE RAD COMP TR BAND LRG (VASCULAR PRODUCTS) ×2 IMPLANT
ELECT DEFIB PAD ADLT CADENCE (PAD) ×4 IMPLANT
GLIDESHEATH SLEND SS 6F .021 (SHEATH) ×2 IMPLANT
GUIDEWIRE INQWIRE 1.5J.035X260 (WIRE) ×1 IMPLANT
INQWIRE 1.5J .035X260CM (WIRE) ×2
KIT HEART LEFT (KITS) ×2 IMPLANT
PACK CARDIAC CATHETERIZATION (CUSTOM PROCEDURE TRAY) ×2 IMPLANT
SHEATH GLIDE SLENDER 4/5FR (SHEATH) ×2 IMPLANT
SHEATH PROBE COVER 6X72 (BAG) ×2 IMPLANT
TRANSDUCER W/STOPCOCK (MISCELLANEOUS) ×2 IMPLANT
TUBING CIL FLEX 10 FLL-RA (TUBING) ×2 IMPLANT

## 2020-09-07 NOTE — Interval H&P Note (Signed)
History and Physical Interval Note:  09/07/2020 11:07 AM  [name]  has presented today for surgery, with the diagnosis of shortness of breath - heart failure.  The various methods of treatment have been discussed with the patient and family. After consideration of risks, benefits and other options for treatment, the patient has consented to  Procedure(s): RIGHT/LEFT HEART CATH AND CORONARY ANGIOGRAPHY (N/A) as a surgical intervention.  The patient's history has been reviewed, patient examined, no change in status, stable for surgery.  I have reviewed the patient's chart and labs.  Questions were answered to the patient's satisfaction.    Cath Lab Visit (complete for each Cath Lab visit)  Clinical Evaluation Leading to the Procedure:   ACS: No.  Non-ACS:    Anginal/Heart Failure Classification: NYHA class III  Anti-ischemic medical therapy: Minimal Therapy (1 class of medications)  Non-Invasive Test Results: No non-invasive testing performed (LVEF 20-25% -> high risk)  Prior CABG: No previous CABG    [name]

## 2020-09-07 NOTE — Discharge Instructions (Signed)
DRINK PLENTY OF FLUIDS OVER THE NEXT 2-3 DAYS.  Radial Site Care  This sheet gives you information about how to care for yourself after your procedure. Your health care provider may also give you more specific instructions. If you have problems or questions, contact your health care provider. What can I expect after the procedure? After the procedure, it is common to have:  Bruising and tenderness at the catheter insertion area. Follow these instructions at home: Medicines  Take over-the-counter and prescription medicines only as told by your health care provider. Insertion site care  Follow instructions from your health care provider about how to take care of your insertion site. Make sure you: ? Wash your hands with soap and water before you change your bandage (dressing). If soap and water are not available, use hand sanitizer. ? Change your dressing as told by your health care provider. ? Leave stitches (sutures), skin glue, or adhesive strips in place. These skin closures may need to stay in place for 2 weeks or longer. If adhesive strip edges start to loosen and curl up, you may trim the loose edges. Do not remove adhesive strips completely unless your health care provider tells you to do that.  Check your insertion site every day for signs of infection. Check for: ? Redness, swelling, or pain. ? Fluid or blood. ? Pus or a bad smell. ? Warmth.  Do not take baths, swim, or use a hot tub until your health care provider approves.  You may shower 24-48 hours after the procedure, or as directed by your health care provider. ? Remove the dressing and gently wash the site with plain soap and water. ? Pat the area dry with a clean towel. ? Do not rub the site. That could cause bleeding.  Do not apply powder or lotion to the site. Activity  For 24 hours after the procedure, or as directed by your health care provider: ? Do not flex or bend the affected arm. ? Do not push or pull  heavy objects with the affected arm. ? Do not drive yourself home from the hospital or clinic. You may drive 24 hours after the procedure unless your health care provider tells you not to. ? Do not operate machinery or power tools.  Do not lift anything that is heavier than 10 lb (4.5 kg), or the limit that you are told, until your health care provider says that it is safe.  Ask your health care provider when it is okay to: ? Return to work or school. ? Resume usual physical activities or sports. ? Resume sexual activity.   General instructions  If the catheter site starts to bleed, raise your arm and put firm pressure on the site. If the bleeding does not stop, get help right away. This is a medical emergency.  If you went home on the same day as your procedure, a responsible adult should be with you for the first 24 hours after you arrive home.  Keep all follow-up visits as told by your health care provider. This is important. Contact a health care provider if:  You have a fever.  You have redness, swelling, or yellow drainage around your insertion site. Get help right away if:  You have unusual pain at the radial site.  The catheter insertion area swells very fast.  The insertion area is bleeding, and the bleeding does not stop when you hold steady pressure on the area.  Your arm or hand becomes pale,   cool, tingly, or numb. These symptoms may represent a serious problem that is an emergency. Do not wait to see if the symptoms will go away. Get medical help right away. Call your local emergency services (911 in the [place] Do not drive yourself to the hospital. Summary  After the procedure, it is common to have bruising and tenderness at the site.  Follow instructions from your health care provider about how to take care of your radial site wound. Check the wound every day for signs of infection.  Do not lift anything that is heavier than 10 lb (4.5 kg), or the limit that you  are told, until your health care provider says that it is safe. This information is not intended to replace advice given to you by your health care provider. Make sure you discuss any questions you have with your health care provider. Document Revised: 08/23/2017 Document Reviewed: 08/23/2017 Elsevier Patient Education  2021 [employer]  

## 2020-09-08 ENCOUNTER — Encounter (INDEPENDENT_AMBULATORY_CARE_PROVIDER_SITE_OTHER): Payer: Medicare Other

## 2020-09-08 ENCOUNTER — Encounter: Payer: Self-pay | Admitting: Cardiology

## 2020-09-08 ENCOUNTER — Encounter (HOSPITAL_COMMUNITY): Payer: Self-pay | Admitting: Internal Medicine

## 2020-09-08 DIAGNOSIS — I493 Ventricular premature depolarization: Secondary | ICD-10-CM

## 2020-09-09 ENCOUNTER — Other Ambulatory Visit (INDEPENDENT_AMBULATORY_CARE_PROVIDER_SITE_OTHER): Payer: Medicare Other

## 2020-09-09 ENCOUNTER — Other Ambulatory Visit: Payer: Self-pay

## 2020-09-09 DIAGNOSIS — N179 Acute kidney failure, unspecified: Secondary | ICD-10-CM

## 2020-09-09 LAB — BASIC METABOLIC PANEL
BUN: 19 mg/dL (ref 6–23)
CO2: 25 mEq/L (ref 19–32)
Calcium: 9.7 mg/dL (ref 8.4–10.5)
Chloride: 103 mEq/L (ref 96–112)
Creatinine, Ser: 1.07 mg/dL (ref 0.40–1.20)
GFR: 51.1 mL/min — ABNORMAL LOW (ref >60.00)
Glucose, Bld: 160 mg/dL — ABNORMAL HIGH (ref 70–99)
Potassium: 4 mEq/L (ref 3.5–5.1)
Sodium: 138 mEq/L (ref 135–145)

## 2020-09-11 ENCOUNTER — Other Ambulatory Visit: Payer: Self-pay | Admitting: Family Medicine

## 2020-09-11 DIAGNOSIS — Z780 Asymptomatic menopausal state: Secondary | ICD-10-CM

## 2020-09-14 ENCOUNTER — Telehealth: Payer: Self-pay

## 2020-09-14 NOTE — Telephone Encounter (Signed)
Pt requesting lab results from last week

## 2020-09-15 ENCOUNTER — Telehealth: Payer: Self-pay | Admitting: Cardiology

## 2020-09-15 MED ORDER — CARVEDILOL 6.25 MG PO TABS: 6.2500 mg | ORAL_TABLET | Freq: Two times a day (BID) | ORAL | 3 refills | Status: DC

## 2020-09-15 NOTE — Telephone Encounter (Signed)
Broadus John with [employer] calling critical EKG results.

## 2020-09-15 NOTE — Telephone Encounter (Addendum)
Candice Camp called with [employer]. Awaiting fax.  [facility] cardiac report 09/15/20: Auto trigger 7:11 am   Sinus tachycardia with multifocal VT, fouplet PVC's, MF PVC's (15in 1 min), and artifact.  V-tach 11 beats max rate 202 back down to 103.   The patient was just getting up, asymptomatic. Current vitals: 149/78 79.  Of note monitor was ordered in hospital after having covid for palpitations and started on carvedilol 3.125 mg 2 times daily 08/06/2020 as they stopped Diltiazem because of low EF20-25%. No missed doses. Had hospital follow up with Dr. Johney Frame on 08/31/20. Next follow up 09/21/20.   DOD Dr. Marlou Porch reviewed strips. "EF 20%, increase Coreg to 6.25 mg BID, BP 142SBP, f/u soon." (already scheduled). Placed report to be scanned into epic.   Called patient, gave recommendations and prescription sent to preferred pharmacy. Patient verbalized understanding.

## 2020-09-17 ENCOUNTER — Other Ambulatory Visit: Payer: Self-pay

## 2020-09-17 ENCOUNTER — Ambulatory Visit
Admission: RE | Admit: 2020-09-17 | Discharge: 2020-09-17 | Disposition: A | Discharge status: Home / Self Care | Payer: Medicare Other | Source: Ambulatory Visit | Attending: Family Medicine | Admitting: Family Medicine

## 2020-09-17 DIAGNOSIS — Z1231 Encounter for screening mammogram for malignant neoplasm of breast: Secondary | ICD-10-CM

## 2020-09-17 NOTE — Telephone Encounter (Signed)
Please advise 

## 2020-09-18 NOTE — Telephone Encounter (Signed)
Patient agreeable to PCP recommendations

## 2020-09-18 NOTE — Telephone Encounter (Signed)
Renal function has improved. May use lasix once daily.

## 2020-09-18 NOTE — Progress Notes (Signed)
Cardiology Office Note:    Date:  09/21/2020   ID:  Kristen, Zamora Jan 18, 1946, MRN [id number]  PCP:  Kristen Arnt, MD   [name]  Cardiologist:  Kristen Bergeron, MD  Advanced Practice Provider:  No care team member to display Electrophysiologist:  None   Referring MD: Kristen Arnt, MD    History of Present Illness:    [name] is a 75 y.o. 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 She now presents to clinic for follow-up.  Of note, since the last time we saw her, the patient returned to the ER with progressive shortness of breath and LE edema. She was initially in the ICU and required intubation. She was aggressively diuresed and extubated and evenutally weaned to RA. Initial weight was 205lbs and she was diuresed to 187lbs. She was discharged home on lasix 40mg  daily.  Since our last visit, the patient underwent coronary angiography which revealed no significant obstructive disease. Cardiac monitor pending.  Today, the patient states she feels overall well. Shortness of breath is improved. Has dyspnea on stairs but that is improving. No chest pain, lightheadedness or dizziness. No LE edema. Wt has been stable. Blood pressure has been well controlled. Has right shoulder pain due to arthritis and is hoping to have shoulder surgery when cleared from a CV standpoint.   Past Medical History:  Diagnosis Date  . Arthritis   . Chronic back pain    buldging disc,scoliosis,arthritis  . Congestive heart failure with left ventricular systolic dysfunction ([place]) 08/24/2020  . Diabetes mellitus without complication ([name])    takes Trulicity,Jardiance,and Metformin daily.Average fasting blood sugar runs around130  . GERD  (gastroesophageal reflux disease)    takes Omeprazole daily  . History of bronchitis > 8 yrs ago  . History of shingles   . HTN (hypertension)    takes Amlodipine and Micardis daily  . Hx of colonic polyps    benign  . Internal and external hemorrhoids without complication   . Joint pain   . Mitral valve prolapse   . Nocturia   . PONV (postoperative nausea and vomiting)    when gets injections in joints gets hives.Betadine rash  . Restless leg syndrome    takes Requip at bedtime  . Seasonal allergies    takes Claritin daily as needed  . Urinary frequency   . Uterine fibroid     Past Surgical History:  Procedure Laterality Date  . BUNIONECTOMY Bilateral   . COLONOSCOPY  07/12/2004   diverticulosis, internal and external hemorrhoids  . COLONOSCOPY    . FINGER ARTHROSCOPY WITH CARPOMETACARPEL [facility]) ARTHROPLASTY Right 09/28/2015   Procedure: RIGHT THUMB TRAPEZIUM EXCISION WITH CARPOMETACARPEL ([place]) ARTHROPLASTY AND TENDON TRANSFER;  Surgeon: Kristen Planas, MD;  Location: [facility];  Service: Orthopedics;  Laterality: Right;  . HEMORRHOID SURGERY     almost 40 yrs ago  . HYSTEROSCOPY WITH D & C  08/23/2000   and resectoscopic myomectomy  . LUMBAR DISC SURGERY  03/16/2005  . LUMBAR EPIDURAL INJECTION    . PLANTAR FASCIA RELEASE Left 02/03/2010   and torn tendon  . RIGHT/LEFT HEART CATH AND CORONARY ANGIOGRAPHY N/A 09/07/2020   Procedure: RIGHT/LEFT HEART CATH AND CORONARY ANGIOGRAPHY;  Surgeon: Kristen Bush, MD;  Location: [place];  Service: Cardiovascular;  Laterality: N/A;  . TENDON TRANSFER Right 09/28/2015   Procedure: TENDON TRANSFER;  Surgeon: Kristen Planas, MD;  Location: [facility];  Service: Orthopedics;  Laterality: Right;  . TONSILLECTOMY    . TUBAL LIGATION    . WRIST SURGERY     left, removal of cyst    Current Medications: Current Meds  Medication Sig  . allopurinol (ZYLOPRIM) 100 MG tablet Take 100 mg by mouth daily.  . carvedilol (COREG) 3.125 MG tablet Take  1 tablet (3.125 mg total) by mouth 2 (two) times daily.  . cholecalciferol (VITAMIN D) 1000 UNITS tablet Take 1,000 Units by mouth daily.  . cycloSPORINE (RESTASIS) 0.05 % ophthalmic emulsion Place 1 drop into both eyes 2 (two) times daily.  . dapagliflozin propanediol (FARXIGA) 10 MG TABS tablet Take 1 tablet (10 mg total) by mouth daily before breakfast.  . Dulaglutide 0.75 MG/0.5ML SOPN Inject 0.75 mg into the skin once a week.  . fluticasone (FLONASE) 50 MCG/ACT nasal spray Place 1 spray into both nostrils daily as needed for allergies.  . furosemide (LASIX) 40 MG tablet Take 0.5 tablets (20 mg total) by mouth daily.  Marland Kitchen glipiZIDE-metformin (METAGLIP) 5-500 MG tablet Take 1 tablet by mouth 2 (two) times daily before a meal.  . montelukast (SINGULAIR) 10 MG tablet Take 10 mg by mouth at bedtime.  . Multiple Vitamin (MULTIVITAMIN WITH MINERALS) TABS tablet Take 1 tablet by mouth daily.  Marland Kitchen omeprazole (PRILOSEC) 40 MG capsule Take 40 mg by mouth daily.  . potassium chloride (KLOR-CON) 10 MEQ tablet Take 1 tablet (10 mEq total) by mouth daily.  . pregabalin (LYRICA) 100 MG capsule Take 100 mg by mouth 2 (two) times daily.  Marland Kitchen rOPINIRole (REQUIP) 3 MG tablet Take 3 mg by mouth at bedtime.  . rosuvastatin (CRESTOR) 20 MG tablet Take 1 tablet (20 mg total) by mouth daily.  . sacubitril-valsartan (ENTRESTO) 24-26 MG Take 1 tablet by mouth 2 (two) times daily.  . [DISCONTINUED] carvedilol (COREG) 6.25 MG tablet Take 1 tablet (6.25 mg total) by mouth 2 (two) times daily.  . [DISCONTINUED] furosemide (LASIX) 40 MG tablet Take 1 tablet (40 mg total) by mouth daily.  . [DISCONTINUED] potassium chloride (KLOR-CON) 10 MEQ tablet Take 2 tablets (20 mEq total) by mouth daily.     Allergies:   Amlodipine, Depo-medrol [methylprednisolone], Gabapentin, Levothyroxine, Sitagliptin, Ace inhibitors, Betadine [povidone iodine], Codeine, Cortizone-10 [hydrocortisone], Tetracyclines & related, and Dexamethasone    Social History   Socioeconomic History  . Marital status: Married    Spouse name: Not on file  . Number of children: 3  . Years of education: Not on file  . Highest education level: Not on file  Occupational History  . Occupation: Retired   Tobacco Use  . Smoking status: Never Smoker  . Smokeless tobacco: Never Used  Substance and Sexual Activity  . Alcohol use: No    Alcohol/week: 0.0 standard drinks  . Drug use: No  . Sexual activity: Yes  Other Topics Concern  . Not on file  Social History Narrative   1 caffeine drinks daily         Social Determinants of Health   Financial Resource Strain: Not on file  Food Insecurity: Not on file  Transportation Needs: Not on file  Physical Activity: Not on file  Stress: Not on file  Social Connections: Not on file     Family History: The patient's family history includes Pancreatic cancer  in her father. There is no history of Colon cancer or Breast cancer.  ROS:   Please see the history of present illness.    Review of Systems  Constitutional: Negative for chills, fever and malaise/fatigue.  HENT: Negative for nosebleeds.   Eyes: Negative for blurred vision and redness.  Respiratory: Positive for shortness of breath.   Cardiovascular: Negative for chest pain, palpitations, orthopnea, claudication, leg swelling and PND.  Gastrointestinal: Negative for nausea and vomiting.  Genitourinary: Negative for dysuria.  Musculoskeletal: Positive for joint pain. Negative for falls.  Neurological: Negative for dizziness and loss of consciousness.  Endo/Heme/Allergies: Negative for polydipsia.  Psychiatric/Behavioral: Negative for substance abuse.    EKGs/Labs/Other Studies Reviewed:    The following studies were reviewed today: TTE September 10, 2020: 1. Left ventricular ejection fraction, by estimation, is 20 to 25%. The  left ventricle has severely decreased function. The left ventricle has no  regional wall motion abnormalities. Left  ventricular diastolic parameters  are indeterminate.  2. Right ventricular systolic function was not well visualized. The right  ventricular size is not well visualized.  3. Left atrial size was moderately dilated.  4. The mitral valve is grossly normal. Mild mitral valve regurgitation.  There is a second jet of mitral regurgitation seen in the four chamber  view with eccentric pattern, mitral regurgitation may be underestimated in  this study.  5. The aortic valve is tricuspid. Aortic valve regurgitation is not  visualized. No aortic stenosis is present.  6. The inferior vena cava is dilated in size with <50% respiratory  variability, suggesting right atrial pressure of 15 mmHg.   TTE 08/04/20 IMPRESSIONS  1. Left ventricular ejection fraction, by estimation, is 20 to 25%. The  left ventricle has severely decreased function. The left ventricle  demonstrates global hypokinesis. The left ventricular internal cavity size  was mildly to moderately dilated. Left  ventricular diastolic parameters are consistent with Grade II diastolic  dysfunction (pseudonormalization). Elevated left atrial pressure.  2. Right ventricular systolic function is normal. The right ventricular  size is normal. There is mildly elevated pulmonary artery systolic  pressure. The estimated right ventricular systolic pressure is 88.5 mmHg.  3. Left atrial size was moderately dilated.  4. The mitral valve is degenerative. Mild mitral valve regurgitation. No  evidence of mitral stenosis.  5. The aortic valve is tricuspid. There is mild calcification of the  aortic valve. There is mild thickening of the aortic valve. Aortic valve  regurgitation is not visualized. Mild aortic valve sclerosis is present,  with no evidence of aortic valve  stenosis.  6. The inferior vena cava is dilated in size with <50% respiratory  variability, suggesting right atrial pressure of 15 mmHg.   FINDINGS  Left Ventricle: Left  ventricular ejection fraction, by estimation, is 20  to 25%. The left ventricle has severely decreased function. The left  ventricle demonstrates global hypokinesis. The left ventricular internal  cavity size was mildly to moderately  dilated. There is no left ventricular hypertrophy. Left ventricular  diastolic parameters are consistent with Grade II diastolic dysfunction  (pseudonormalization). Elevated left atrial pressure.   Right Ventricle: The right ventricular size is normal. No increase in  right ventricular wall thickness. Right ventricular systolic function is  normal. There is mildly elevated pulmonary artery systolic pressure. The  tricuspid regurgitant velocity is 2.39  m/s, and with an assumed right atrial pressure of 15 mmHg, the estimated  right ventricular systolic pressure is 02.7 mmHg.   Left Atrium: Left  atrial size was moderately dilated.   Right Atrium: Right atrial size was normal in size.   Pericardium: Trivial pericardial effusion is present.   Mitral Valve: The mitral valve is degenerative in appearance. Mild mitral  annular calcification. Mild mitral valve regurgitation. No evidence of  mitral valve stenosis.   Tricuspid Valve: The tricuspid valve is grossly normal. Tricuspid valve  regurgitation is mild . No evidence of tricuspid stenosis.   Aortic Valve: The aortic valve is tricuspid. There is mild calcification  of the aortic valve. There is mild thickening of the aortic valve. Aortic  valve regurgitation is not visualized. Mild aortic valve sclerosis is  present, with no evidence of aortic  valve stenosis.   Pulmonic Valve: The pulmonic valve was grossly normal. Pulmonic valve  regurgitation is trivial. No evidence of pulmonic stenosis.   Aorta: The aortic root and ascending aorta are structurally normal, with  no evidence of dilitation.   Venous: The inferior vena cava is dilated in size with less than 50%  respiratory variability, suggesting  right atrial pressure of 15 mmHg.   IAS/Shunts: The atrial septum is grossly normal.   RHC/LHC 09/07/20: Conclusions: 1. Mild plaquing of the mid LAD with 10-20% stenosis.  Otherwise, no angiographically significant coronary artery disease. 2. Normal left and right heart filling pressures. 3. Borderline elevated pulmonary artery pressure. 4. Normal Fick cardiac output/index.  Recommendations: 1. Maintain net even fluid balance and optimize goal directed medical therapy for non-ischemic cardiomyopathy. 2. Primary prevention of coronary artery disease.    Recent Labs: 08/03/2020: TSH 2.701 08/16/2020: Magnesium 2.1 08/19/2020: B Natriuretic Peptide 146.3 08/20/2020: ALT 45 08/24/2020: Platelets 447.0 09/07/2020: Hemoglobin 10.9 09/09/2020: BUN 19; Creatinine, Ser 1.07; Potassium 4.0; Sodium 138  Recent Lipid Panel    Component Value Date/Time   TRIG 194 (H) 08/13/2020 1035      Physical Exam:    VS:  BP (!) 112/50   Pulse 76   Ht 5\' 4"  (1.626 m)   Wt 191 lb (86.6 kg)   SpO2 97%   BMI 32.79 kg/m     Wt Readings from Last 3 Encounters:  09/21/20 191 lb (86.6 kg)  09/07/20 190 lb (86.2 kg)  08/31/20 189 lb 3.2 oz (85.8 kg)     GEN:  Well nourished, well developed in no acute distress HEENT: Normal NECK: No JVD; No carotid bruits CARDIAC: RRR, no murmurs, rubs, gallops RESPIRATORY:  Clear to auscultation without rales, wheezing or rhonchi  ABDOMEN: Soft, non-tender, non-distended MUSCULOSKELETAL:  No edema; No deformity  SKIN: Warm and dry NEUROLOGIC:  Alert and oriented x 3 PSYCHIATRIC:  Normal affect   ASSESSMENT:    1. HFrEF (heart failure with reduced ejection fraction) ([place])   2. PVC (premature ventricular contraction)   3. Hypertension associated with diabetes ([place])   4. Controlled type 2 diabetes mellitus without complication, without long-term current use of insulin ([place])   5. Coronary artery disease involving native coronary artery of native heart without  angina pectoris    PLAN:    In order of problems listed above:  #Newly diagnosed HFrEF with LVEF 20-25%:  Diagnosed in the setting of COVID-19 pneumonia. TTE with globally depressed systolic function. Trop only mildly elevated. Cath with mild, non-obstructive disease. Currently, patient is improving with NYHA class II symptoms. Euvolemic on exam today -Decrease lasix to 20mg  daily now that starting farxiga -Continue K supplementation -Change coreg to 3.125mg  BID to allow blood pressure room for entresto -Start entresto 24.25mg  BID -Start farxiga  10mg  daily -Repeat BMET next week -If stable, will add spironolactone -Limited TTE to reassess LVEF -Cath without obstructive disease -If no improvement in EF despite 90 days of OMT, will need ICD -Continue daily weights and call if gaining >2-3lbs in 2 days or 5lbs in 1 week -Low Na diet  #Non-obstructive CAD: Cath on 09/07/20 with mild plaquing of the mid LAD with 10-20% stenosis.  Otherwise, no angiographically significant coronary artery disease. -Continue ASA and crestor  #PVCs/NSVT: -30 day cardiac monitor in place -Monitor electrolytes carefully with diuresis  #CKD: Cr back to baseline. -Lasix as above -Repeat BMET as above after starting new medications -Follow-up with PCP as scheduled  #DMII: -Add farxiga today  #[id number] Pneumonia: Complicated by profound hypoxia likely compounded by pulmonary edema requiring re-hospitalization as detailed above. Required intubation and was diuresed successfuly and weaned to RA. Doing much better from a symptom standpoint -Improving -Continue symptomatic management    Medication Adjustments/Labs and Tests Ordered: Current medicines are reviewed at length with the patient today.  Concerns regarding medicines are outlined above.  Orders Placed This Encounter  Procedures  . Basic metabolic panel  . ECHOCARDIOGRAM LIMITED   Meds ordered this encounter  Medications  . carvedilol  (COREG) 3.125 MG tablet    Sig: Take 1 tablet (3.125 mg total) by mouth 2 (two) times daily.    Dispense:  180 tablet    Refill:  3  . sacubitril-valsartan (ENTRESTO) 24-26 MG    Sig: Take 1 tablet by mouth 2 (two) times daily.    Dispense:  60 tablet    Refill:  0  . dapagliflozin propanediol (FARXIGA) 10 MG TABS tablet    Sig: Take 1 tablet (10 mg total) by mouth daily before breakfast.    Dispense:  90 tablet    Refill:  1  . furosemide (LASIX) 40 MG tablet    Sig: Take 0.5 tablets (20 mg total) by mouth daily.    Dispense:  45 tablet    Refill:  1  . potassium chloride (KLOR-CON) 10 MEQ tablet    Sig: Take 1 tablet (10 mEq total) by mouth daily.    Dispense:  90 tablet    Refill:  3    Patient Instructions  Medication Instructions:  Your physician has recommended you make the following change in your medication:  1.  REDUCE the Coreg to 3.125 mg taking 1 twice a day 2.  REDUCE the Lasix to 1/2 tablet daily = 20 mg  3.  REDUCE the Potassium to 1 tablet daily = 10 meq 4.  START Entresto 24-26 mg taking 1 twice a day 5.  START Farxiga 10 mg taking 1 daily   *If you need a refill on your cardiac medications before your next appointment, please call your pharmacy*   Lab Work: 1 WEEK:  BMET Can come anytime after 7:30 a.m on 09/29/2020  If you have labs (blood work) drawn today and your tests are completely normal, you will receive your results only by: Marland Kitchen MyChart Message (if you have MyChart) OR . A paper copy in the mail If you have any lab test that is abnormal or we need to change your treatment, we will call you to review the results.   Testing/Procedures: Your physician has requested that you have an limited echocardiogram IN 3 WEEKS. Echocardiography is a painless test that uses sound waves to create images of your heart. It provides your doctor with information about the size and shape of your  heart and how well your heart's chambers and valves are working. This  procedure takes approximately one hour. There are no restrictions for this procedure.     Follow-Up: At [facility], you and your health needs are our priority.  As part of our continuing mission to provide you with exceptional heart care, we have created designated Provider Care Teams.  These Care Teams include your primary Cardiologist (physician) and Advanced Practice Providers (APPs -  Physician Assistants and Nurse Practitioners) who all work together to provide you with the care you need, when you need it.  We recommend signing up for the patient portal called "MyChart".  Sign up information is provided on this After Visit Summary.  MyChart is used to connect with patients for Virtual Visits (Telemedicine).  Patients are able to view lab/test results, encounter notes, upcoming appointments, etc.  Non-urgent messages can be sent to your provider as well.   To learn more about what you can do with MyChart, go to [email or web address].    Your next appointment:   1 month(s)  The format for your next appointment:   In Person  Provider:   Gwyndolyn Kaufman, MD   Other Instructions      Signed, Kristen Bergeron, MD  09/21/2020 10:32 AM    [place]

## 2020-09-21 ENCOUNTER — Encounter: Payer: Self-pay | Admitting: Cardiology

## 2020-09-21 ENCOUNTER — Other Ambulatory Visit: Payer: Self-pay

## 2020-09-21 ENCOUNTER — Ambulatory Visit: Payer: Medicare Other | Admitting: Cardiology

## 2020-09-21 VITALS — BP 112/50 | HR 76 | Ht 64.0 in | Wt 191.0 lb

## 2020-09-21 DIAGNOSIS — I5020 Unspecified systolic (congestive) heart failure: Secondary | ICD-10-CM | POA: Diagnosis not present

## 2020-09-21 DIAGNOSIS — I2510 Atherosclerotic heart disease of native coronary artery without angina pectoris: Secondary | ICD-10-CM

## 2020-09-21 DIAGNOSIS — E1159 Type 2 diabetes mellitus with other circulatory complications: Secondary | ICD-10-CM

## 2020-09-21 DIAGNOSIS — I493 Ventricular premature depolarization: Secondary | ICD-10-CM

## 2020-09-21 DIAGNOSIS — E119 Type 2 diabetes mellitus without complications: Secondary | ICD-10-CM | POA: Diagnosis not present

## 2020-09-21 DIAGNOSIS — I152 Hypertension secondary to endocrine disorders: Secondary | ICD-10-CM

## 2020-09-21 MED ORDER — ENTRESTO 24-26 MG PO TABS: 1.0000 | ORAL_TABLET | Freq: Two times a day (BID) | ORAL | 0 refills | Status: DC

## 2020-09-21 MED ORDER — CARVEDILOL 3.125 MG PO TABS: 3.1250 mg | ORAL_TABLET | Freq: Two times a day (BID) | ORAL | 3 refills | Status: DC

## 2020-09-21 MED ORDER — FUROSEMIDE 40 MG PO TABS: 20.0000 mg | ORAL_TABLET | Freq: Every day | ORAL | 1 refills | Status: DC

## 2020-09-21 MED ORDER — DAPAGLIFLOZIN PROPANEDIOL 10 MG PO TABS: 10.0000 mg | ORAL_TABLET | Freq: Every day | ORAL | 1 refills | Status: DC

## 2020-09-21 MED ORDER — POTASSIUM CHLORIDE ER 10 MEQ PO TBCR: 10.0000 meq | EXTENDED_RELEASE_TABLET | Freq: Every day | ORAL | 3 refills | Status: DC

## 2020-09-21 NOTE — Patient Instructions (Addendum)
Medication Instructions:  Your physician has recommended you make the following change in your medication:  1.  REDUCE the Coreg to 3.125 mg taking 1 twice a day 2.  REDUCE the Lasix to 1/2 tablet daily = 20 mg  3.  REDUCE the Potassium to 1 tablet daily = 10 meq 4.  START Entresto 24-26 mg taking 1 twice a day 5.  START Farxiga 10 mg taking 1 daily   *If you need a refill on your cardiac medications before your next appointment, please call your pharmacy*   Lab Work: 1 WEEK:  BMET Can come anytime after 7:30 a.m on 09/29/2020  If you have labs (blood work) drawn today and your tests are completely normal, you will receive your results only by: Marland Kitchen MyChart Message (if you have MyChart) OR . A paper copy in the mail If you have any lab test that is abnormal or we need to change your treatment, we will call you to review the results.   Testing/Procedures: Your physician has requested that you have an limited echocardiogram IN 3 WEEKS. Echocardiography is a painless test that uses sound waves to create images of your heart. It provides your doctor with information about the size and shape of your heart and how well your heart's chambers and valves are working. This procedure takes approximately one hour. There are no restrictions for this procedure.     Follow-Up: At [facility], you and your health needs are our priority.  As part of our continuing mission to provide you with exceptional heart care, we have created designated Provider Care Teams.  These Care Teams include your primary Cardiologist (physician) and Advanced Practice Providers (APPs -  Physician Assistants and Nurse Practitioners) who all work together to provide you with the care you need, when you need it.  We recommend signing up for the patient portal called "MyChart".  Sign up information is provided on this After Visit Summary.  MyChart is used to connect with patients for Virtual Visits (Telemedicine).  Patients are  able to view lab/test results, encounter notes, upcoming appointments, etc.  Non-urgent messages can be sent to your provider as well.   To learn more about what you can do with MyChart, go to [email or web address].    Your next appointment:   1 month(s)  The format for your next appointment:   In Person  Provider:   Gwyndolyn Kaufman, MD   Other Instructions

## 2020-09-22 ENCOUNTER — Other Ambulatory Visit: Payer: Self-pay

## 2020-09-22 MED ORDER — ENTRESTO 24-26 MG PO TABS: 1.0000 | ORAL_TABLET | Freq: Two times a day (BID) | ORAL | 5 refills | Status: DC

## 2020-09-22 NOTE — Telephone Encounter (Signed)
Pt's medication was resent to pt's pharmacy as requested. Confirmation received.  °

## 2020-09-29 ENCOUNTER — Other Ambulatory Visit: Payer: Medicare Other

## 2020-09-29 ENCOUNTER — Other Ambulatory Visit: Payer: Self-pay

## 2020-09-29 DIAGNOSIS — E1159 Type 2 diabetes mellitus with other circulatory complications: Secondary | ICD-10-CM

## 2020-09-29 DIAGNOSIS — I152 Hypertension secondary to endocrine disorders: Secondary | ICD-10-CM

## 2020-09-29 DIAGNOSIS — I5020 Unspecified systolic (congestive) heart failure: Secondary | ICD-10-CM

## 2020-09-29 DIAGNOSIS — I493 Ventricular premature depolarization: Secondary | ICD-10-CM

## 2020-09-29 LAB — BASIC METABOLIC PANEL
BUN/Creatinine Ratio: 18 (ref 12–28)
BUN: 22 mg/dL (ref 8–27)
CO2: 21 mmol/L (ref 20–29)
Calcium: 9.9 mg/dL (ref 8.7–10.3)
Chloride: 99 mmol/L (ref 96–106)
Creatinine, Ser: 1.24 mg/dL — ABNORMAL HIGH (ref 0.57–1.00)
Glucose: 126 mg/dL — ABNORMAL HIGH (ref 65–99)
Potassium: 4.4 mmol/L (ref 3.5–5.2)
Sodium: 137 mmol/L (ref 134–144)
eGFR: 46 mL/min/{1.73_m2} — ABNORMAL LOW (ref >59)

## 2020-10-06 ENCOUNTER — Encounter: Payer: Self-pay | Admitting: Family Medicine

## 2020-10-06 ENCOUNTER — Other Ambulatory Visit: Payer: Self-pay

## 2020-10-06 ENCOUNTER — Ambulatory Visit: Payer: Medicare Other | Admitting: Family Medicine

## 2020-10-06 VITALS — BP 120/74 | HR 79 | Temp 97.8°F | Ht 64.0 in | Wt 189.6 lb

## 2020-10-06 DIAGNOSIS — E162 Hypoglycemia, unspecified: Secondary | ICD-10-CM | POA: Diagnosis not present

## 2020-10-06 DIAGNOSIS — I152 Hypertension secondary to endocrine disorders: Secondary | ICD-10-CM

## 2020-10-06 DIAGNOSIS — R490 Dysphonia: Secondary | ICD-10-CM

## 2020-10-06 DIAGNOSIS — Z888 Allergy status to other drugs, medicaments and biological substances status: Secondary | ICD-10-CM | POA: Diagnosis not present

## 2020-10-06 DIAGNOSIS — M19012 Primary osteoarthritis, left shoulder: Secondary | ICD-10-CM

## 2020-10-06 DIAGNOSIS — I428 Other cardiomyopathies: Secondary | ICD-10-CM | POA: Diagnosis not present

## 2020-10-06 DIAGNOSIS — E119 Type 2 diabetes mellitus without complications: Secondary | ICD-10-CM | POA: Diagnosis not present

## 2020-10-06 DIAGNOSIS — Z28311 Partially vaccinated for covid-19: Secondary | ICD-10-CM

## 2020-10-06 DIAGNOSIS — E1159 Type 2 diabetes mellitus with other circulatory complications: Secondary | ICD-10-CM

## 2020-10-06 DIAGNOSIS — Z289 Immunization not carried out for unspecified reason: Secondary | ICD-10-CM

## 2020-10-06 MED ORDER — METFORMIN HCL ER 750 MG PO TB24: 1500.0000 mg | ORAL_TABLET | Freq: Every day | ORAL | 3 refills | Status: DC

## 2020-10-06 NOTE — Progress Notes (Signed)
Subjective  CC:  Chief Complaint  Patient presents with  . Diabetes  . Congestive Heart Failure  . Hypertension    HPI: [name] is a 75 y.o. female who presents to the office today for follow up of diabetes and problems listed above in the chief complaint.This is short-term follow-up for multiple medical problems.  I reviewed all the notes from cardiology visits.  She is attending the heart failure clinic and multiple medications were recently adjusted.  I reviewed cardiology work-up today.   Diabetes follow up: Her diabetic control is reported as Unchanged.  However, she admits to symptomatic hypoglycemia some mornings before lunch.  She reports this happens at least weekly.  She remains on Metaglip.  She denies symptoms of hyperglycemia She denies exertional CP or SOB. She denies foot sores or paresthesias.  She was recently started on Farxiga for her heart failure and diabetes.  She seems to be tolerating this well.  Heart failure: Nonischemic cardiomyopathy.  Started on [place].  Tolerating well.  Lasix back down to 20 mg daily.  Most recent renal function revealed a slight bump in creatinine but tolerable.  Potassium has been stable.  Weight is being monitored daily and is stable.  She has no shortness of breath, no lower extremity edema and her energy is improving.  COVID pneumonia with respiratory failure: Fortunately improving.  Respiratory status remains stable.  Energy levels are improving.  She will be eligible for Covid vaccination in April after her treatment.  She continues to weigh pros versus cons.  Hypertension: Blood pressures are tolerating the changes in medications and are currently well controlled.  Left shoulder pain: Was due for left shoulder replacement but this has been deferred due to her acute illness and heart failure.  She is taking Nucynta at night.  Continues to struggle.  Helpful to get better so she can have surgery.  Hoarseness: Since  intubation she has noticed voice hoarseness.  Denies postnasal drainage.  Denies pain.   Wt Readings from Last 3 Encounters:  10/06/20 189 lb 9.6 oz (86 kg)  09/21/20 191 lb (86.6 kg)  09/07/20 190 lb (86.2 kg)    BP Readings from Last 3 Encounters:  10/06/20 120/74  09/21/20 (!) 112/50  09/07/20 (!) 126/59    Assessment  1. Controlled type 2 diabetes mellitus without complication, without long-term current use of insulin ([place])   2. Hypoglycemia   3. Allergy to statin medication   4. Nonischemic cardiomyopathy ([place])   5. Primary osteoarthritis of left shoulder   6. Hypertension associated with diabetes ([place])   7. Hoarseness   8. COVID-19 vaccine series not completed      Plan   Diabetes is currently very well controlled.  Given hypoglycemia in addition of Farxiga, will stop glipizide.  Change to Metformin XR 750, 1500 mg every morning if tolerated.  Monitor blood sugars twice daily.  Education given.  Recheck 3 months.  Heart failure and hypertension are well controlled per heart clinic.  Tolerating [place].  Will have results of heart monitor shortly and has scheduled a follow-up echocardiogram in 3 months.  Hopeful that EF will have improved by that time.  Osteoarthritis of shoulder: Continue Nucynta for pain management for now.  Discussed options.  Hypertension is well controlled  Hoarseness: We will monitor.  If persists, will recommend ENT evaluation.  COVID vaccine counseling done.  She will be eligible in April.  Reinforced importance.  Follow up: 3 months for recheck.  No orders of the defined types were placed in this encounter.  Meds ordered this encounter  Medications  . metFORMIN (GLUCOPHAGE XR) 750 MG 24 hr tablet    Sig: Take 2 tablets (1,500 mg total) by mouth daily with breakfast.    Dispense:  180 tablet    Refill:  3      Immunization History  Administered Date(s) Administered  . Fluad Quad(high Dose 65+) 07/01/2020  . Influenza Split 06/01/2010   . Influenza, High Dose Seasonal PF 05/13/2014, 04/24/2015, 04/12/2017, 04/19/2018, 04/24/2019, 06/16/2020  . Influenza, Seasonal, Injecte, Preservative Fre 04/09/2013, 05/03/2016  . Influenza,inj,Quad PF,6+ Mos 05/01/2017  . Influenza-Unspecified 04/03/2012  . Pneumococcal Conjugate-13 05/13/2014  . Pneumococcal Polysaccharide-23 02/15/2011  . Td 08/01/1998  . Tdap 05/01/2004  . Zoster 02/15/2011  . Zoster Recombinat (Shingrix) 04/24/2019, 08/13/2019    Diabetes Related Lab Review: Lab Results  Component Value Date   HGBA1C 6.9 (H) 08/11/2020   HGBA1C 6.6 (H) 08/03/2020   HGBA1C 9.0 (H) 09/21/2015    No results found for: [facility] Lab Results  Component Value Date   CREATININE 1.24 (H) 09/29/2020   BUN 22 09/29/2020   NA 137 09/29/2020   K 4.4 09/29/2020   CL 99 09/29/2020   CO2 21 09/29/2020   No results found for: CHOL No results found for: HDL No results found for: [facility] Lab Results  Component Value Date   TRIG 194 (H) 08/13/2020   TRIG 357 (H) 08/12/2020   TRIG 260 (H) 08/10/2020   No results found for: CHOLHDL No results found for: LDLDIRECT The ASCVD Risk score Mikey Bussing DC Jr., et al., 2013) failed to calculate for the following reasons:   Cannot find a previous HDL lab I have reviewed the [place], Fam and Soc history. Patient Active Problem List   Diagnosis Date Noted  . Nonischemic cardiomyopathy ([place]) 10/06/2020  . Acute HFrEF (heart failure with reduced ejection fraction) ([place])   . Congestive heart failure with left ventricular systolic dysfunction ([place]) 08/24/2020  . Pressure injury of skin 08/14/2020  . 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 Allergy to statin medication 06/16/2020  . DDD (degenerative disc disease), cervical 04/30/2020  . Statin intolerance 08/22/2019  . Osteoarthritis of left knee 11/05/2018  . Chronic pansinusitis 10/04/2017  . Chronic low back pain 09/05/2017  . Complication of surgical procedure  09/05/2017  . Degenerative lumbar disc 06/04/2015  . S/P lumbar laminectomy 06/04/2015  . Restless leg syndrome 05/13/2014  . Controlled type 2 diabetes mellitus without complication, without long-term current use of insulin ([place]) 04/09/2013    Formatting of this note might be different from the original. New onset 04/2013, doesn't tolerate 1000mg  metformin due to GI upset.   Marland Kitchen Spinal stenosis of lumbar region without neurogenic claudication 09/12/2012    Formatting of this note might be different from the original. Right L4/5   . GERD (gastroesophageal reflux disease) 05/25/2011    Chronic recurrent heartburn symptoms for 3 or 4 years without weight loss, bleeding or dysphagia. Bonds well to daily PPI.Gatha Mayer, MD, [facility]    . Hemorrhoids, internal, with bleeding 05/25/2011    Visible on 2005 colonoscopy and endoscopy 05/25/2011.   . Obesity 05/25/2011  . Hypertension associated with diabetes ([place]) 11/08/2010  . Chronic allergic rhinitis 08/19/2010  . Mitral valve prolapse 10/10/2008    Formatting of this note might be different from the original. 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 She has never used smokeless tobacco. She reports that she does not drink alcohol and does not use drugs.  Review of Systems: Ophthalmic: negative for eye pain, loss of vision or double vision Cardiovascular: negative for chest pain Respiratory: negative for SOB or persistent cough Gastrointestinal: negative for abdominal pain Genitourinary: negative for dysuria or gross hematuria MSK: negative for foot lesions Neurologic: negative for weakness or gait disturbance  Objective  Vitals: BP 120/74   Pulse 79   Temp 97.8 F (36.6 C) (Temporal)   Ht 5\' 4"  (1.626 m)   Wt 189 lb 9.6 oz (86 kg)   SpO2 99%   BMI 32.54 kg/m  General: well appearing, no acute distress   Psych:  Alert and oriented, normal mood and affect HEENT:  Normocephalic, atraumatic, moist mucous membranes, supple neck  Cardiovascular:  Nl S1 and S2, RRR without murmur, gallop or rub. no edema Respiratory:  Good breath sounds bilaterally, CTAB with normal effort, no rales     Diabetic education: ongoing education regarding chronic disease management for diabetes was given today. We continue to reinforce the ABC's of diabetic management: A1c (<7 or 8 dependent upon patient), tight blood pressure control, and cholesterol management with goal LDL < 100 minimally. We discuss diet strategies, exercise recommendations, medication options and possible side effects. At each visit, we review recommended immunizations and preventive care recommendations for diabetics and stress that good diabetic control can prevent other problems. See below for this patient's data.    Commons side effects, risks, benefits, and alternatives for medications and treatment plan prescribed today were discussed, and the patient expressed understanding of the given instructions. Patient is instructed to call or message via MyChart if he/she has any questions or concerns regarding our treatment plan. No barriers to understanding were identified. We discussed Red Flag symptoms and signs in detail. Patient expressed understanding regarding what to do in case of urgent or emergency type symptoms.   Medication list was reconciled, printed and provided to the patient in AVS. Patient instructions and summary information was reviewed with the patient as documented in the AVS. This note was prepared with assistance of Dragon voice recognition software. Occasional wrong-word or sound-a-like substitutions may have occurred due to the inherent limitations of voice recognition software  This visit occurred during the SARS-CoV-2 public health emergency.  Safety protocols were in place, including screening questions prior to the visit,  additional usage of staff PPE, and extensive cleaning of exam room while observing appropriate contact time as indicated for disinfecting solutions.

## 2020-10-06 NOTE — Patient Instructions (Addendum)
Please return in 3 months for diabetes follow up  Please stop the metaglip and start the metformin XR: start with 1 tab daily for 1-2 weeks. Increase to 2tabs together daily if tolerating it. Continue to check sugars once or twice a day.   If you have any questions or concerns, please don't hesitate to send me a message via MyChart or call the office at [phone]. Thank you for visiting with [place] today! It's our pleasure caring for you.

## 2020-10-09 ENCOUNTER — Other Ambulatory Visit: Payer: Self-pay | Admitting: Cardiology

## 2020-10-09 DIAGNOSIS — I493 Ventricular premature depolarization: Secondary | ICD-10-CM

## 2020-10-15 ENCOUNTER — Other Ambulatory Visit: Payer: Self-pay

## 2020-10-15 ENCOUNTER — Ambulatory Visit (HOSPITAL_COMMUNITY): Payer: Medicare Other | Attending: Cardiology

## 2020-10-15 DIAGNOSIS — I152 Hypertension secondary to endocrine disorders: Secondary | ICD-10-CM | POA: Insufficient documentation

## 2020-10-15 DIAGNOSIS — E1159 Type 2 diabetes mellitus with other circulatory complications: Secondary | ICD-10-CM | POA: Diagnosis present

## 2020-10-15 DIAGNOSIS — I5020 Unspecified systolic (congestive) heart failure: Secondary | ICD-10-CM | POA: Insufficient documentation

## 2020-10-15 DIAGNOSIS — I493 Ventricular premature depolarization: Secondary | ICD-10-CM | POA: Insufficient documentation

## 2020-10-15 LAB — ECHOCARDIOGRAM LIMITED
Area-P 1/2: 2.66 cm2
S' Lateral: 4.7 cm

## 2020-10-16 ENCOUNTER — Telehealth: Payer: Self-pay

## 2020-10-16 MED ORDER — MAGNESIUM OXIDE 400 MG PO CAPS: 400.0000 mg | ORAL_CAPSULE | Freq: Every day | ORAL | 3 refills | Status: DC

## 2020-10-16 NOTE — Telephone Encounter (Signed)
-----   Message from Freada Bergeron, MD sent at 10/13/2020  9:56 AM EDT ----- Her monitor showed frequent extra beats from the bottom chamber of the heart as well as brief runs of these beats in a row. This is common to see in people with reduced heart pumping function. None of these episodes sustained for greater than 11 beats. The coreg should help suppress these and we just need to make sure her electrolytes (K and Mg) are within normal range. Can we ask her to take magnesium 400mg  at night (she can buy over the counter). Her potassium looks good.

## 2020-10-16 NOTE — Telephone Encounter (Signed)
The patient has been notified of the result and verbalized understanding.  All questions (if any) were answered. Antonieta Iba, RN 10/16/2020 3:34 PM  Patient will start taking magnesium 400 mg daily.

## 2020-10-18 NOTE — Progress Notes (Signed)
Cardiology Office Note:    Date:  10/20/2020   ID:  Kristen, Zamora 1945-09-28, MRN [id number]  PCP:  Leamon Arnt, MD   [place]  Cardiologist:  Freada Bergeron, MD  Advanced Practice Provider:  No care team member to display Electrophysiologist:  None    Referring MD: Leamon Arnt, MD    History of Present Illness:    ADRIENA Zamora is a 75 y.o. 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 She now presents to clinic for follow-up.  Patient was diagnosed with new onset cardiomyopathy in 08/2020 when she presented with SOB found to have COVID at that time. TTE with LVEF 20-25%. Was initially stable and discharged home but returned to [facility] on 08/10/2020 with progressive shortness of breath and LE edema requiring ICU admission and intubation. She was aggressively diuresed and extubated and evenutally weaned to RA. Initial weight was 205lbs and she was diuresed to 187lbs. She was discharged home on lasix 40mg  daily.  After discharge, we arranged for coronary angiography on 09/07/20 for work-up of newly diagnosed HFrEF which revealed no significant obstructive disease. She returned to clinic on 09/21/20 where she felt overall improved. Had DOE with stairs. Wt was stable and blood pressure was well controlled. Repeat TTE 10/15/20 with mild improvement in LVEF to 30-35%, G2DD, elevated LAP, normal RA, mildly dilated LA, mild MR. Cardiac monitor that was placed for frequent ventricular ectopy revealed several runs of NSVT and frequent PVCs but no sustained arrhythmias or Afib.  Today, the patient states that she feels overall better. Able to do all activities of daily living including cleaning the house and grocery shopping without issues.  If she tries to do too much in one day, however, she can feel very tired and wiped for the rest of the day. No shortness of breath, orthopnea, PND, or LE edema. Weights have been stable at home and only fluctuate a pound at a time. Today's weight 189.4lbs. Tolerating all medications. No lightheadedness, dizziness, syncope. She is hoping to undergo shoulder surgery in the near future.   Past Medical History:  Diagnosis Date  . Arthritis   . Chronic back pain    buldging disc,scoliosis,arthritis  . Congestive heart failure with left ventricular systolic dysfunction ([place]) 08/24/2020  . Diabetes mellitus without complication ([place])    takes Trulicity,Jardiance,and Metformin daily.Average fasting blood sugar runs around130  . GERD (gastroesophageal reflux disease)    takes Omeprazole daily  . History of bronchitis > 8 yrs ago  . History of shingles   . HTN (hypertension)    takes Amlodipine and Micardis daily  . Hx of colonic polyps    benign  . Internal and external hemorrhoids without complication   . Joint pain   . Mitral valve prolapse   . Nocturia   . PONV (postoperative nausea and vomiting)    when gets injections in joints gets hives.Betadine rash  . Restless leg syndrome    takes Requip at bedtime  . Seasonal allergies    takes Claritin daily as needed  . Urinary frequency   . Uterine fibroid     Past Surgical History:  Procedure Laterality Date  . BUNIONECTOMY Bilateral   . COLONOSCOPY  07/12/2004   diverticulosis, internal and external hemorrhoids  . COLONOSCOPY    .  FINGER ARTHROSCOPY WITH CARPOMETACARPEL [facility]) ARTHROPLASTY Right 09/28/2015   Procedure: RIGHT THUMB TRAPEZIUM EXCISION WITH CARPOMETACARPEL ([place]) ARTHROPLASTY AND TENDON TRANSFER;  Surgeon: Iran Planas, MD;  Location: [place];  Service: Orthopedics;  Laterality: Right;  . HEMORRHOID SURGERY     almost 40 yrs ago  . HYSTEROSCOPY WITH D & C  08/23/2000   and resectoscopic myomectomy  . LUMBAR DISC SURGERY   03/16/2005  . LUMBAR EPIDURAL INJECTION    . PLANTAR FASCIA RELEASE Left 02/03/2010   and torn tendon  . RIGHT/LEFT HEART CATH AND CORONARY ANGIOGRAPHY N/A 09/07/2020   Procedure: RIGHT/LEFT HEART CATH AND CORONARY ANGIOGRAPHY;  Surgeon: Nelva Bush, MD;  Location: [place] [facility];  Service: Cardiovascular;  Laterality: N/A;  . TENDON TRANSFER Right 09/28/2015   Procedure: TENDON TRANSFER;  Surgeon: Iran Planas, MD;  Location: [place];  Service: Orthopedics;  Laterality: Right;  . TONSILLECTOMY    . TUBAL LIGATION    . WRIST SURGERY     left, removal of cyst    Current Medications: Current Meds  Medication Sig  . allopurinol (ZYLOPRIM) 100 MG tablet Take 100 mg by mouth daily.  . carvedilol (COREG) 3.125 MG tablet Take 1 tablet (3.125 mg total) by mouth 2 (two) times daily.  . cholecalciferol (VITAMIN D) 1000 UNITS tablet Take 1,000 Units by mouth daily.  . cycloSPORINE (RESTASIS) 0.05 % ophthalmic emulsion Place 1 drop into both eyes 2 (two) times daily.  . dapagliflozin propanediol (FARXIGA) 10 MG TABS tablet Take 1 tablet (10 mg total) by mouth daily before breakfast.  . Dulaglutide 0.75 MG/0.5ML SOPN Inject 0.75 mg into the skin once a week.  . fluticasone (FLONASE) 50 MCG/ACT nasal spray Place 1 spray into both nostrils daily as needed for allergies.  . furosemide (LASIX) 40 MG tablet Take 0.5 tablets (20 mg total) by mouth daily.  . Magnesium Oxide 400 MG CAPS Take 1 capsule (400 mg total) by mouth daily.  . metFORMIN (GLUCOPHAGE XR) 750 MG 24 hr tablet Take 2 tablets (1,500 mg total) by mouth daily with breakfast.  . montelukast (SINGULAIR) 10 MG tablet Take 10 mg by mouth at bedtime.  . Multiple Vitamin (MULTIVITAMIN WITH MINERALS) TABS tablet Take 1 tablet by mouth daily.  . NUCYNTA 50 MG tablet Take 50 mg by mouth 3 (three) times daily.  Marland Kitchen omeprazole (PRILOSEC) 40 MG capsule Take 40 mg by mouth daily.  . potassium chloride (KLOR-CON) 10 MEQ tablet Take 1 tablet (10 mEq  total) by mouth daily.  . pregabalin (LYRICA) 100 MG capsule Take 100 mg by mouth 2 (two) times daily.  Marland Kitchen rOPINIRole (REQUIP) 3 MG tablet Take 3 mg by mouth at bedtime.  . rosuvastatin (CRESTOR) 20 MG tablet Take 1 tablet (20 mg total) by mouth daily.  . sacubitril-valsartan (ENTRESTO) 49-51 MG Take 1 tablet by mouth 2 (two) times daily.  Marland Kitchen spironolactone (ALDACTONE) 25 MG tablet Take 0.5 tablets (12.5 mg total) by mouth daily.  . [DISCONTINUED] sacubitril-valsartan (ENTRESTO) 24-26 MG Take 1 tablet by mouth 2 (two) times daily.     Allergies:   Amlodipine, Depo-medrol [methylprednisolone], Gabapentin, Levothyroxine, Sitagliptin, Ace inhibitors, Betadine [povidone iodine], Codeine, Cortizone-10 [hydrocortisone], Tetracyclines & related, and Dexamethasone   Social History   Socioeconomic History  . Marital status: Married    Spouse name: Not on file  . Number of children: 3  . Years of education: Not on file  . Highest education level: Not on file  Occupational History  . Occupation: Retired  Tobacco Use  . Smoking status: Never Smoker  . Smokeless tobacco: Never Used  Substance and Sexual Activity  . Alcohol use: No    Alcohol/week: 0.0 standard drinks  . Drug use: No  . Sexual activity: Yes  Other Topics Concern  . Not on file  Social History Narrative   1 caffeine drinks daily         Social Determinants of Health   Financial Resource Strain: Not on file  Food Insecurity: Not on file  Transportation Needs: Not on file  Physical Activity: Not on file  Stress: Not on file  Social Connections: Not on file     Family History: The patient's family history includes Pancreatic cancer in her father. There is no history of Colon cancer or Breast cancer.  ROS:   Please see the history of present illness.    Review of Systems  Constitutional: Negative for chills and fever.  HENT: Negative for hearing loss and sore throat.   Eyes: Negative for blurred vision and redness.   Respiratory: Negative for shortness of breath.   Cardiovascular: Negative for chest pain, palpitations, orthopnea, claudication, leg swelling and PND.  Gastrointestinal: Negative for melena, nausea and vomiting.  Genitourinary: Negative for dysuria and flank pain.  Musculoskeletal: Negative for myalgias.  Neurological: Negative for dizziness and loss of consciousness.  Endo/Heme/Allergies: Negative for polydipsia.  Psychiatric/Behavioral: Negative for substance abuse.    EKGs/Labs/Other Studies Reviewed:    The following studies were reviewed today: TTE October 30, 2020: 1. Left ventricular ejection fraction, by estimation, is 30 to 35%. The  left ventricle has moderately decreased function. The left ventricle  demonstrates global hypokinesis. Left ventricular diastolic parameters are  consistent with Grade II diastolic  dysfunction (pseudonormalization). Elevated left atrial pressure. The E/e'  is 22.0.  2. Right ventricular systolic function is normal. The right ventricular  size is normal. Tricuspid regurgitation signal is inadequate for assessing  PA pressure.  3. Left atrial size was mildly dilated.  4. The mitral valve is grossly normal. Mild mitral valve regurgitation.  No evidence of mitral stenosis.  5. The aortic valve is tricuspid. Aortic valve regurgitation is not  visualized. No aortic stenosis is present.  6. The inferior vena cava is normal in size with greater than 50%  respiratory variability, suggesting right atrial pressure of 3 mmHg.   Cardiac Monitor 10/09/20:  Patient's monitoring period was from 09/08/20-10/07/20  Predominant rhythm was NSR with frequent PVCs. Average HR 84bpm (ranged from 59-84bpm)  There were several runs of NSVT, however, no sustained VT (longest episode was 11 beats)  Frequent ventricular ectopy was detected (10%; 314,307 beats)  SVE was rare <1%  No afib or significant pauses  Patient triggered events correlated with PVCs and  NSVT.  TTE 08/14/20: 1. Left ventricular ejection fraction, by estimation, is 20 to 25%. The  left ventricle has severely decreased function. The left ventricle has no  regional wall motion abnormalities. Left ventricular diastolic parameters  are indeterminate.  2. Right ventricular systolic function was not well visualized. The right  ventricular size is not well visualized.  3. Left atrial size was moderately dilated.  4. The mitral valve is grossly normal. Mild mitral valve regurgitation.  There is a second jet of mitral regurgitation seen in the four chamber  view with eccentric pattern, mitral regurgitation may be underestimated in  this study.  5. The aortic valve is tricuspid. Aortic valve regurgitation is not  visualized. No aortic stenosis is present.  6. The  inferior vena cava is dilated in size with <50% respiratory  variability, suggesting right atrial pressure of 15 mmHg.   TTE 08/04/20 IMPRESSIONS  1. Left ventricular ejection fraction, by estimation, is 20 to 25%. The  left ventricle has severely decreased function. The left ventricle  demonstrates global hypokinesis. The left ventricular internal cavity size  was mildly to moderately dilated. Left  ventricular diastolic parameters are consistent with Grade II diastolic  dysfunction (pseudonormalization). Elevated left atrial pressure.  2. Right ventricular systolic function is normal. The right ventricular  size is normal. There is mildly elevated pulmonary artery systolic  pressure. The estimated right ventricular systolic pressure is 30.8 mmHg.  3. Left atrial size was moderately dilated.  4. The mitral valve is degenerative. Mild mitral valve regurgitation. No  evidence of mitral stenosis.  5. The aortic valve is tricuspid. There is mild calcification of the  aortic valve. There is mild thickening of the aortic valve. Aortic valve  regurgitation is not visualized. Mild aortic valve sclerosis is present,   with no evidence of aortic valve  stenosis.  6. The inferior vena cava is dilated in size with <50% respiratory  variability, suggesting right atrial pressure of 15 mmHg.   FINDINGS  Left Ventricle: Left ventricular ejection fraction, by estimation, is 20  to 25%. The left ventricle has severely decreased function. The left  ventricle demonstrates global hypokinesis. The left ventricular internal  cavity size was mildly to moderately  dilated. There is no left ventricular hypertrophy. Left ventricular  diastolic parameters are consistent with Grade II diastolic dysfunction  (pseudonormalization). Elevated left atrial pressure.   Right Ventricle: The right ventricular size is normal. No increase in  right ventricular wall thickness. Right ventricular systolic function is  normal. There is mildly elevated pulmonary artery systolic pressure. The  tricuspid regurgitant velocity is 2.39  m/s, and with an assumed right atrial pressure of 15 mmHg, the estimated  right ventricular systolic pressure is 65.7 mmHg.   Left Atrium: Left atrial size was moderately dilated.   Right Atrium: Right atrial size was normal in size.   Pericardium: Trivial pericardial effusion is present.   Mitral Valve: The mitral valve is degenerative in appearance. Mild mitral  annular calcification. Mild mitral valve regurgitation. No evidence of  mitral valve stenosis.   Tricuspid Valve: The tricuspid valve is grossly normal. Tricuspid valve  regurgitation is mild . No evidence of tricuspid stenosis.   Aortic Valve: The aortic valve is tricuspid. There is mild calcification  of the aortic valve. There is mild thickening of the aortic valve. Aortic  valve regurgitation is not visualized. Mild aortic valve sclerosis is  present, with no evidence of aortic  valve stenosis.   Pulmonic Valve: The pulmonic valve was grossly normal. Pulmonic valve  regurgitation is trivial. No evidence of pulmonic stenosis.    Aorta: The aortic root and ascending aorta are structurally normal, with  no evidence of dilitation.   Venous: The inferior vena cava is dilated in size with less than 50%  respiratory variability, suggesting right atrial pressure of 15 mmHg.   IAS/Shunts: The atrial septum is grossly normal.   RHC/LHC 09/07/20: Conclusions: 1. Mild plaquing of the mid LAD with 10-20% stenosis. Otherwise, no angiographically significant coronary artery disease. 2. Normal left and right heart filling pressures. 3. Borderline elevated pulmonary artery pressure. 4. Normal Fick cardiac output/index.  Recommendations: 1. Maintain net even fluid balance and optimize goal directed medical therapy for non-ischemic cardiomyopathy. 2. Primary  Recent Labs: 08/03/2020: TSH 2.701 08/16/2020: Magnesium 2.1 08/19/2020: B Natriuretic Peptide 146.3 08/20/2020: ALT 45 08/24/2020: Platelets 447.0 09/07/2020: Hemoglobin 10.9 09/29/2020: BUN 22; Creatinine, Ser 1.24; Potassium 4.4; Sodium 137  Recent Lipid Panel    Component Value Date/Time   TRIG 194 (H) 08/13/2020 1035     Physical Exam:    VS:  BP 130/72   Pulse 63   Ht 5\' 4"  (1.626 m)   Wt 188 lb 9.6 oz (85.5 kg)   SpO2 98%   BMI 32.37 kg/m     Wt Readings from Last 3 Encounters:  10/20/20 188 lb 9.6 oz (85.5 kg)  10/06/20 189 lb 9.6 oz (86 kg)  09/21/20 191 lb (86.6 kg)     GEN:  Well nourished, well developed in no acute distress HEENT: Normal NECK: No JVD; No carotid bruits LYMPHATICS: No lymphadenopathy CARDIAC: RRR, no murmurs, rubs, gallops RESPIRATORY:  Clear to auscultation without rales, wheezing or rhonchi  ABDOMEN: Soft, non-tender, non-distended MUSCULOSKELETAL:  No edema; No deformity  SKIN: Warm and dry NEUROLOGIC:  Alert and oriented x 3 PSYCHIATRIC:  Normal affect   ASSESSMENT:    1. HFrEF (heart failure with reduced ejection fraction) ([place])   2. PVC (premature ventricular contraction)   3. Hypertension associated  with diabetes ([place])   4. Coronary artery disease involving native coronary artery of native heart without angina pectoris   5. Controlled type 2 diabetes mellitus without complication, without long-term current use of insulin (HCC)   6. Stage 3a chronic kidney disease ([place])   7. Nonischemic cardiomyopathy ([facility])    PLAN:    In order of problems listed above:  #Newly diagnosed HFrEF:  #Non-ischemic cardiomypathy: Diagnosed in the setting of COVID-19 pneumonia. TTE with globally depressed systolic function. Trop only mildly elevated. Cath with mild, non-obstructive disease. Initial TTE with LVEF 20-25%-->now 30-35%. Currently, patient is improving with NYHA class II symptoms. Euvolemic on exam today -Continue lasix 20mg  daiky -Continue K supplementation -Continue coreg to 3.125mg  BID  -Increase entresto to 49/51mg  BID -Continue farxiga 10mg  daily -Start spironolactone 12.5mg  daily  -BMET next week -Cath without obstructive disease -If no improvement in EF despite 90 days of OMT, will need ICD -Continue daily weights and call if gaining >2-3lbs in 2 days or 5lbs in 1 week -Low Na diet -If EF remains low after 3 months of GDMT, will need ICD  #Non-obstructive CAD: Cath on 09/07/20 with mild plaquing of the mid LAD with 10-20% stenosis. Otherwise, no angiographically significant coronary artery disease. -Continue ASA and crestor  #PVCs/NSVT: Cardiac monitor with several runs of NSVT (longest 11 beats) and frequent (10%) burden of PVCs. No sustained arrhythmias. -Monitor electrolytes carefully with diuresis -Continue coreg as above -Manage HF as above  #CKD: Cr back to baseline. -Lasix as above -BMET next week -Follow-up with PCP as scheduled  #DMII: -Continue farxiga   #VELFY-10 Pneumonia: Complicated by profound hypoxialikely compounded by pulmonary edema requiring re-hospitalization as detailed above. Required intubation and was diuresed successfuly and weaned to RA.  Doing much better from a symptom standpoint -Improving -Continue symptomatic management   Medication Adjustments/Labs and Tests Ordered: Current medicines are reviewed at length with the patient today.  Concerns regarding medicines are outlined above.  Orders Placed This Encounter  Procedures  . Basic metabolic panel   Meds ordered this encounter  Medications  . sacubitril-valsartan (ENTRESTO) 49-51 MG    Sig: Take 1 tablet by mouth 2 (two) times daily.    Dispense:  180 tablet  Refill:  3  . spironolactone (ALDACTONE) 25 MG tablet    Sig: Take 0.5 tablets (12.5 mg total) by mouth daily.    Dispense:  45 tablet    Refill:  3    Patient Instructions  Medication Instructions:  1) INCREASE ENTRESTO to 49-51 mg twice daily 2) START SPIRONOLACTONE 12.5 mg daily *If you need a refill on your cardiac medications before your next appointment, please call your pharmacy*  Lab Work: ONE WEEK: BMET  If you have labs (blood work) drawn today and your tests are completely normal, you will receive your results only by: Marland Kitchen MyChart Message (if you have MyChart) OR . A paper copy in the mail If you have any lab test that is abnormal or we need to change your treatment, we will call you to review the results.  Follow-Up: You are scheduled with Dr. Johney Frame 11/11/20 at 9:40AM.    Signed, Freada Bergeron, MD  10/20/2020 12:40 PM    [place]

## 2020-10-20 ENCOUNTER — Encounter: Payer: Self-pay | Admitting: Cardiology

## 2020-10-20 ENCOUNTER — Ambulatory Visit: Payer: Medicare Other | Admitting: Cardiology

## 2020-10-20 ENCOUNTER — Other Ambulatory Visit: Payer: Self-pay

## 2020-10-20 VITALS — BP 130/72 | HR 63 | Ht 64.0 in | Wt 188.6 lb

## 2020-10-20 DIAGNOSIS — N1831 Chronic kidney disease, stage 3a: Secondary | ICD-10-CM

## 2020-10-20 DIAGNOSIS — I2510 Atherosclerotic heart disease of native coronary artery without angina pectoris: Secondary | ICD-10-CM | POA: Diagnosis not present

## 2020-10-20 DIAGNOSIS — I5020 Unspecified systolic (congestive) heart failure: Secondary | ICD-10-CM | POA: Diagnosis not present

## 2020-10-20 DIAGNOSIS — I493 Ventricular premature depolarization: Secondary | ICD-10-CM

## 2020-10-20 DIAGNOSIS — I428 Other cardiomyopathies: Secondary | ICD-10-CM

## 2020-10-20 DIAGNOSIS — E1159 Type 2 diabetes mellitus with other circulatory complications: Secondary | ICD-10-CM

## 2020-10-20 DIAGNOSIS — I152 Hypertension secondary to endocrine disorders: Secondary | ICD-10-CM

## 2020-10-20 DIAGNOSIS — E119 Type 2 diabetes mellitus without complications: Secondary | ICD-10-CM

## 2020-10-20 MED ORDER — SPIRONOLACTONE 25 MG PO TABS: 12.5000 mg | ORAL_TABLET | Freq: Every day | ORAL | 3 refills | Status: DC

## 2020-10-20 MED ORDER — ENTRESTO 49-51 MG PO TABS: 1.0000 | ORAL_TABLET | Freq: Two times a day (BID) | ORAL | 3 refills | Status: DC

## 2020-10-20 NOTE — Patient Instructions (Addendum)
Medication Instructions:  1) INCREASE ENTRESTO to 49-51 mg twice daily 2) START SPIRONOLACTONE 12.5 mg daily *If you need a refill on your cardiac medications before your next appointment, please call your pharmacy*  Lab Work: ONE WEEK: BMET  If you have labs (blood work) drawn today and your tests are completely normal, you will receive your results only by: Marland Kitchen MyChart Message (if you have MyChart) OR . A paper copy in the mail If you have any lab test that is abnormal or we need to change your treatment, we will call you to review the results.  Follow-Up: You are scheduled with Dr. Johney Frame 11/11/20 at 9:40AM.

## 2020-10-22 ENCOUNTER — Telehealth: Payer: Self-pay

## 2020-10-22 DIAGNOSIS — M12819 Other specific arthropathies, not elsewhere classified, unspecified shoulder: Secondary | ICD-10-CM | POA: Insufficient documentation

## 2020-10-22 NOTE — Telephone Encounter (Signed)
  LAST APPOINTMENT DATE: 10/06/2020   NEXT APPOINTMENT DATE:@6 /16/2022  MEDICATION:pregabalin (LYRICA) 100 MG capsule  PHARMACY:[facility] (Address: [address], [place] [address] : Phone: [phone])

## 2020-10-23 ENCOUNTER — Other Ambulatory Visit: Payer: Self-pay

## 2020-10-23 MED ORDER — PREGABALIN 100 MG PO CAPS: 100.0000 mg | ORAL_CAPSULE | Freq: Two times a day (BID) | ORAL | 3 refills | Status: DC

## 2020-10-23 NOTE — Telephone Encounter (Signed)
Please advise refill to [facility] in [place], controlled.  Disregard where I attempted to send to pharmacy, did not show it was controlled on my end. Thanks.

## 2020-10-27 ENCOUNTER — Other Ambulatory Visit: Payer: Self-pay

## 2020-10-27 MED ORDER — PREGABALIN 100 MG PO CAPS: 100.0000 mg | ORAL_CAPSULE | Freq: Two times a day (BID) | ORAL | 3 refills | Status: DC

## 2020-10-27 NOTE — Telephone Encounter (Signed)
Refill request resent to PCP

## 2020-10-27 NOTE — Telephone Encounter (Signed)
.   Patient states pharmacy has not received can we resend    LAST APPOINTMENT DATE: 10/06/2020   NEXT APPOINTMENT DATE:@3 /25/2022  MEDICATION:pregabalin (LYRICA) 100 MG capsule   [place], [place] - [address]  CLINICAL FILLS OUT ALL BELOW:   LAST REFILL:  QTY:  REFILL DATE:    OTHER COMMENTS:    Okay for refill?  Please advise

## 2020-10-27 NOTE — Telephone Encounter (Signed)
Please resend, [facility] did not receive previous refill

## 2020-10-28 ENCOUNTER — Other Ambulatory Visit: Payer: Medicare Other | Admitting: *Deleted

## 2020-10-28 ENCOUNTER — Other Ambulatory Visit: Payer: Self-pay

## 2020-10-28 DIAGNOSIS — I5020 Unspecified systolic (congestive) heart failure: Secondary | ICD-10-CM

## 2020-10-28 LAB — BASIC METABOLIC PANEL
BUN/Creatinine Ratio: 24 (ref 12–28)
BUN: 32 mg/dL — ABNORMAL HIGH (ref 8–27)
CO2: 19 mmol/L — ABNORMAL LOW (ref 20–29)
Calcium: 10.1 mg/dL (ref 8.7–10.3)
Chloride: 100 mmol/L (ref 96–106)
Creatinine, Ser: 1.34 mg/dL — ABNORMAL HIGH (ref 0.57–1.00)
Glucose: 177 mg/dL — ABNORMAL HIGH (ref 65–99)
Potassium: 4.7 mmol/L (ref 3.5–5.2)
Sodium: 142 mmol/L (ref 134–144)
eGFR: 42 mL/min/{1.73_m2} — ABNORMAL LOW (ref >59)

## 2020-10-29 ENCOUNTER — Telehealth: Payer: Self-pay

## 2020-10-29 DIAGNOSIS — I5021 Acute systolic (congestive) heart failure: Secondary | ICD-10-CM

## 2020-10-29 MED ORDER — FUROSEMIDE 20 MG PO TABS: ORAL_TABLET | ORAL | 3 refills | Status: DC

## 2020-10-29 NOTE — Telephone Encounter (Signed)
Rn spoke to patient regarding lab results and new medication orders. Patient verbalized understanding. Orders placed for BMET. Lab appointment schedule for 4/13.

## 2020-11-06 NOTE — Progress Notes (Deleted)
Cardiology Office Note:    Date:  11/06/2020   ID:  [name], DOB 1946/05/07, MRN [id number]  PCP:  Kristen Arnt, MD   [place]  Cardiologist:  Kristen Bergeron, MD  Advanced Practice Provider:  No care team member to display Electrophysiologist:  None    Referring MD: Kristen Arnt, MD    History of Present Illness:    [name] is a 75 y.o. 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 She now presents to clinic for follow-up.  Patient was diagnosed with new onset cardiomyopathy in 08/2020 when she presented with SOB found to have COVID at that time. TTE with LVEF 20-25%. Was initially stable and discharged home but returned to [facility] on 08/10/2020 with progressive shortness of breath and LE edema requiring ICU admission and intubation. She was aggressively diuresed and extubated and evenutally weaned to RA. Initial weight was 205lbs and she was diuresed to 187lbs. She was discharged home on lasix 40mg  daily.  After discharge, we arranged for coronary angiography on 09/07/20 for work-up of newly diagnosed HFrEF which revealed no significant obstructive disease. She returned to clinic on 09/21/20 where she felt overall improved. Had DOE with stairs. Wt was stable and blood pressure was well controlled. Repeat TTE 10/15/20 with mild improvement in LVEF to 30-35%, G2DD, elevated LAP, normal RA, mildly dilated LA, mild MR. Cardiac monitor that was placed for frequent ventricular ectopy revealed several runs of NSVT and frequent PVCs but no sustained arrhythmias or Afib.  During last visit on 10/20/20, she was overall feeling better. She was euvolemic. We increased her entresto and started spironolactone.   Past Medical History:   Diagnosis Date  . Arthritis   . Chronic back pain    buldging disc,scoliosis,arthritis  . Congestive heart failure with left ventricular systolic dysfunction ([place]) 08/24/2020  . Diabetes mellitus without complication ([place])    takes Trulicity,Jardiance,and Metformin daily.Average fasting blood sugar runs around130  . GERD (gastroesophageal reflux disease)    takes Omeprazole daily  . History of bronchitis > 8 yrs ago  . History of shingles   . HTN (hypertension)    takes Amlodipine and Micardis daily  . Hx of colonic polyps    benign  . Internal and external hemorrhoids without complication   . Joint pain   . Mitral valve prolapse   . Nocturia   . PONV (postoperative nausea and vomiting)    when gets injections in joints gets hives.Betadine rash  . Restless leg syndrome    takes Requip at bedtime  . Seasonal allergies    takes Claritin daily as needed  . Urinary frequency   . Uterine fibroid     Past Surgical History:  Procedure Laterality Date  . BUNIONECTOMY Bilateral   . COLONOSCOPY  07/12/2004   diverticulosis, internal and external hemorrhoids  . COLONOSCOPY    . FINGER ARTHROSCOPY WITH CARPOMETACARPEL [facility]) ARTHROPLASTY Right 09/28/2015   Procedure: RIGHT THUMB TRAPEZIUM EXCISION WITH CARPOMETACARPEL ([place]) ARTHROPLASTY AND TENDON TRANSFER;  Surgeon: Kristen Planas, MD;  Location: [place];  Service: Orthopedics;  Laterality: Right;  . HEMORRHOID SURGERY     almost 40 yrs ago  . HYSTEROSCOPY WITH D & C  08/23/2000   and resectoscopic myomectomy  . LUMBAR DISC SURGERY  03/16/2005  .  LUMBAR EPIDURAL INJECTION    . PLANTAR FASCIA RELEASE Left 02/03/2010   and torn tendon  . RIGHT/LEFT HEART CATH AND CORONARY ANGIOGRAPHY N/A 09/07/2020   Procedure: RIGHT/LEFT HEART CATH AND CORONARY ANGIOGRAPHY;  Surgeon: Kristen Bush, MD;  Location: [place] [facility];  Service: Cardiovascular;  Laterality: N/A;  . TENDON TRANSFER Right 09/28/2015   Procedure: TENDON TRANSFER;  Surgeon: Kristen Planas, MD;  Location: [place];  Service: Orthopedics;  Laterality: Right;  . TONSILLECTOMY    . TUBAL LIGATION    . WRIST SURGERY     left, removal of cyst    Current Medications: No outpatient medications have been marked as taking for the 11/11/20 encounter (Appointment) with Kristen Bergeron, MD.     Allergies:   Amlodipine, Depo-medrol [methylprednisolone], Gabapentin, Levothyroxine, Sitagliptin, Ace inhibitors, Betadine [povidone iodine], Codeine, Cortizone-10 [hydrocortisone], Tetracyclines & related, and Dexamethasone   Social History   Socioeconomic History  . Marital status: Married    Spouse name: Not on file  . Number of children: 3  . Years of education: Not on file  . Highest education level: Not on file  Occupational History  . Occupation: Retired   Tobacco Use  . Smoking status: Never Smoker  . Smokeless tobacco: Never Used  Substance and Sexual Activity  . Alcohol use: No    Alcohol/week: 0.0 standard drinks  . Drug use: No  . Sexual activity: Yes  Other Topics Concern  . Not on file  Social History Narrative   1 caffeine drinks daily         Social Determinants of Health   Financial Resource Strain: Not on file  Food Insecurity: Not on file  Transportation Needs: Not on file  Physical Activity: Not on file  Stress: Not on file  Social Connections: Not on file     Family History: The patient's ***family history includes Pancreatic cancer in her father. There is no history of Colon cancer or Breast cancer.  ROS:   Please see the history of present illness.    *** All other systems reviewed and are negative.  EKGs/Labs/Other Studies Reviewed:    The following studies were reviewed today: TTE October 25, 2020: 1. Left ventricular ejection fraction, by estimation, is 30 to 35%. The  left ventricle has moderately decreased function. The left ventricle  demonstrates global hypokinesis. Left ventricular diastolic parameters are  consistent with Grade  II diastolic  dysfunction (pseudonormalization). Elevated left atrial pressure. The E/e'  is 22.0.  2. Right ventricular systolic function is normal. The right ventricular  size is normal. Tricuspid regurgitation signal is inadequate for assessing  PA pressure.  3. Left atrial size was mildly dilated.  4. The mitral valve is grossly normal. Mild mitral valve regurgitation.  No evidence of mitral stenosis.  5. The aortic valve is tricuspid. Aortic valve regurgitation is not  visualized. No aortic stenosis is present.  6. The inferior vena cava is normal in size with greater than 50%  respiratory variability, suggesting right atrial pressure of 3 mmHg.   Cardiac Monitor 10/09/20:  Patient's monitoring period was from 09/08/20-10/07/20  Predominant rhythm was NSR with frequent PVCs. Average HR 84bpm (ranged from 59-84bpm)  There were several runs of NSVT, however, no sustained VT (longest episode was 11 beats)  Frequent ventricular ectopy was detected (10%; 314,307 beats)  SVE was rare <1%  No afib or significant pauses  Patient triggered events correlated with PVCs and NSVT.  TTE 08/14/20: 1. Left ventricular ejection fraction, by  estimation, is 20 to 25%. The  left ventricle has severely decreased function. The left ventricle has no  regional wall motion abnormalities. Left ventricular diastolic parameters  are indeterminate.  2. Right ventricular systolic function was not well visualized. The right  ventricular size is not well visualized.  3. Left atrial size was moderately dilated.  4. The mitral valve is grossly normal. Mild mitral valve regurgitation.  There is a second jet of mitral regurgitation seen in the four chamber  view with eccentric pattern, mitral regurgitation may be underestimated in  this study.  5. The aortic valve is tricuspid. Aortic valve regurgitation is not  visualized. No aortic stenosis is present.  6. The inferior vena cava is dilated  in size with <50% respiratory  variability, suggesting right atrial pressure of 15 mmHg.   TTE 08/04/20 IMPRESSIONS  1. Left ventricular ejection fraction, by estimation, is 20 to 25%. The  left ventricle has severely decreased function. The left ventricle  demonstrates global hypokinesis. The left ventricular internal cavity size  was mildly to moderately dilated. Left  ventricular diastolic parameters are consistent with Grade II diastolic  dysfunction (pseudonormalization). Elevated left atrial pressure.  2. Right ventricular systolic function is normal. The right ventricular  size is normal. There is mildly elevated pulmonary artery systolic  pressure. The estimated right ventricular systolic pressure is 46.8 mmHg.  3. Left atrial size was moderately dilated.  4. The mitral valve is degenerative. Mild mitral valve regurgitation. No  evidence of mitral stenosis.  5. The aortic valve is tricuspid. There is mild calcification of the  aortic valve. There is mild thickening of the aortic valve. Aortic valve  regurgitation is not visualized. Mild aortic valve sclerosis is present,  with no evidence of aortic valve  stenosis.  6. The inferior vena cava is dilated in size with <50% respiratory  variability, suggesting right atrial pressure of 15 mmHg.   FINDINGS  Left Ventricle: Left ventricular ejection fraction, by estimation, is 20  to 25%. The left ventricle has severely decreased function. The left  ventricle demonstrates global hypokinesis. The left ventricular internal  cavity size was mildly to moderately  dilated. There is no left ventricular hypertrophy. Left ventricular  diastolic parameters are consistent with Grade II diastolic dysfunction  (pseudonormalization). Elevated left atrial pressure.   Right Ventricle: The right ventricular size is normal. No increase in  right ventricular wall thickness. Right ventricular systolic function is  normal. There is mildly  elevated pulmonary artery systolic pressure. The  tricuspid regurgitant velocity is 2.39  m/s, and with an assumed right atrial pressure of 15 mmHg, the estimated  right ventricular systolic pressure is 03.2 mmHg.   Left Atrium: Left atrial size was moderately dilated.   Right Atrium: Right atrial size was normal in size.   Pericardium: Trivial pericardial effusion is present.   Mitral Valve: The mitral valve is degenerative in appearance. Mild mitral  annular calcification. Mild mitral valve regurgitation. No evidence of  mitral valve stenosis.   Tricuspid Valve: The tricuspid valve is grossly normal. Tricuspid valve  regurgitation is mild . No evidence of tricuspid stenosis.   Aortic Valve: The aortic valve is tricuspid. There is mild calcification  of the aortic valve. There is mild thickening of the aortic valve. Aortic  valve regurgitation is not visualized. Mild aortic valve sclerosis is  present, with no evidence of aortic  valve stenosis.   Pulmonic Valve: The pulmonic valve was grossly normal. Pulmonic valve  regurgitation is trivial.  No evidence of pulmonic stenosis.   Aorta: The aortic root and ascending aorta are structurally normal, with  no evidence of dilitation.   Venous: The inferior vena cava is dilated in size with less than 50%  respiratory variability, suggesting right atrial pressure of 15 mmHg.   IAS/Shunts: The atrial septum is grossly normal.   RHC/LHC 09/07/20: Conclusions: 1. Mild plaquing of the mid LAD with 10-20% stenosis. Otherwise, no angiographically significant coronary artery disease. 2. Normal left and right heart filling pressures. 3. Borderline elevated pulmonary artery pressure. 4. Normal Fick cardiac output/index.  Recommendations: 1. Maintain net even fluid balance and optimize goal directed medical therapy for non-ischemic cardiomyopathy. 2. Primary    EKG:  EKG is *** ordered today.  The ekg ordered today demonstrates  ***  Recent Labs: 08/03/2020: TSH 2.701 08/16/2020: Magnesium 2.1 08/19/2020: B Natriuretic Peptide 146.3 08/20/2020: ALT 45 08/24/2020: Platelets 447.0 09/07/2020: Hemoglobin 10.9 10/28/2020: BUN 32; Creatinine, Ser 1.34; Potassium 4.7; Sodium 142  Recent Lipid Panel    Component Value Date/Time   TRIG 194 (H) 08/13/2020 1035     Risk Assessment/Calculations:   {Does this patient have ATRIAL FIBRILLATION?[phone]}   Physical Exam:    VS:  There were no vitals taken for this visit.    Wt Readings from Last 3 Encounters:  10/20/20 188 lb 9.6 oz (85.5 kg)  10/06/20 189 lb 9.6 oz (86 kg)  09/21/20 191 lb (86.6 kg)     GEN: *** Well nourished, well developed in no acute distress HEENT: Normal NECK: No JVD; No carotid bruits LYMPHATICS: No lymphadenopathy CARDIAC: ***RRR, no murmurs, rubs, gallops RESPIRATORY:  Clear to auscultation without rales, wheezing or rhonchi  ABDOMEN: Soft, non-tender, non-distended MUSCULOSKELETAL:  No edema; No deformity  SKIN: Warm and dry NEUROLOGIC:  Alert and oriented x 3 PSYCHIATRIC:  Normal affect   ASSESSMENT:    No diagnosis found. PLAN:    In order of problems listed above:  #Newly diagnosed HFrEF:  #Non-ischemic cardiomypathy: Diagnosed in the setting of COVID-19 pneumonia. TTE with globally depressed systolic function. Trop only mildly elevated.Cath with mild, non-obstructive disease. Initial TTE with LVEF 20-25%-->now 30-35%. Currently, patient is improving with NYHA class II symptoms. Euvolemic on exam today -Continue lasix 20mg  daiky -Continue K supplementation -Continuecoreg to3.125mg  BID -Continue entresto to 49/51mg  BID -Continue farxiga 10mg  daily -Continue spironolactone 12.5mg  daily  -Cath without obstructive disease -If no improvement in EF despite 90 days of OMT, will need ICD -Continue daily weights and call if gaining >2-3lbs in 2 days or 5lbs in 1 week -Low Na diet -If EF remains low after 3 months of GDMT,  will need ICD  #Non-obstructive CAD: Cath on 09/07/20 with mild plaquing of the mid LAD with 10-20% stenosis. Otherwise, no angiographically significant coronary artery disease. -Continue ASA and crestor  #PVCs/NSVT: Cardiac monitor with several runs of NSVT (longest 11 beats) and frequent (10%) burden of PVCs. No sustained arrhythmias. -Monitor electrolytes carefully with diuresis -Continue coreg as above -Manage HF as above  #CKD: Cr back to baseline. -Lasix as above -Follow-up with PCP as scheduled  #DMII: -Continue farxiga  #SEGBT-51 Pneumonia: Complicated by profound hypoxialikely compounded by pulmonary edema requiring re-hospitalization as detailed above. Required intubation and was diuresed successfuly and weaned to RA.Doing much better from a symptom standpoint -Improving -Continue symptomatic management   {Are you ordering a CV Procedure (e.g. stress test, cath, DCCV, TEE, etc)?   Press F2        :[id number]}    Medication  Adjustments/Labs and Tests Ordered: Current medicines are reviewed at length with the patient today.  Concerns regarding medicines are outlined above.  No orders of the defined types were placed in this encounter.  No orders of the defined types were placed in this encounter.   There are no Patient Instructions on file for this visit.   Signed, Kristen Bergeron, MD  11/06/2020 1:04 PM    [place]

## 2020-11-11 ENCOUNTER — Other Ambulatory Visit: Payer: Self-pay

## 2020-11-11 ENCOUNTER — Ambulatory Visit: Payer: Medicare Other | Admitting: Cardiology

## 2020-11-11 ENCOUNTER — Other Ambulatory Visit: Payer: Medicare Other | Admitting: *Deleted

## 2020-11-11 ENCOUNTER — Encounter: Payer: Self-pay | Admitting: Cardiology

## 2020-11-11 VITALS — BP 120/72 | HR 71 | Ht 64.0 in | Wt 188.8 lb

## 2020-11-11 DIAGNOSIS — I493 Ventricular premature depolarization: Secondary | ICD-10-CM | POA: Diagnosis not present

## 2020-11-11 DIAGNOSIS — I152 Hypertension secondary to endocrine disorders: Secondary | ICD-10-CM

## 2020-11-11 DIAGNOSIS — E119 Type 2 diabetes mellitus without complications: Secondary | ICD-10-CM

## 2020-11-11 DIAGNOSIS — I428 Other cardiomyopathies: Secondary | ICD-10-CM

## 2020-11-11 DIAGNOSIS — I5020 Unspecified systolic (congestive) heart failure: Secondary | ICD-10-CM

## 2020-11-11 DIAGNOSIS — N1831 Chronic kidney disease, stage 3a: Secondary | ICD-10-CM

## 2020-11-11 DIAGNOSIS — E1159 Type 2 diabetes mellitus with other circulatory complications: Secondary | ICD-10-CM

## 2020-11-11 DIAGNOSIS — I5021 Acute systolic (congestive) heart failure: Secondary | ICD-10-CM

## 2020-11-11 DIAGNOSIS — R490 Dysphonia: Secondary | ICD-10-CM | POA: Diagnosis not present

## 2020-11-11 LAB — BASIC METABOLIC PANEL
BUN/Creatinine Ratio: 22 (ref 12–28)
BUN: 26 mg/dL (ref 8–27)
CO2: 20 mmol/L (ref 20–29)
Calcium: 10.1 mg/dL (ref 8.7–10.3)
Chloride: 102 mmol/L (ref 96–106)
Creatinine, Ser: 1.19 mg/dL — ABNORMAL HIGH (ref 0.57–1.00)
Glucose: 176 mg/dL — ABNORMAL HIGH (ref 65–99)
Potassium: 4.5 mmol/L (ref 3.5–5.2)
Sodium: 140 mmol/L (ref 134–144)
eGFR: 48 mL/min/{1.73_m2} — ABNORMAL LOW (ref >59)

## 2020-11-11 MED ORDER — FUROSEMIDE 20 MG PO TABS: ORAL_TABLET | ORAL | 1 refills | Status: DC

## 2020-11-11 NOTE — Progress Notes (Signed)
Cardiology Office Note:    Date:  11/11/2020   ID:  [name], DOB Jan 29, 1946, MRN [id number]  PCP:  Leamon Arnt, MD   [place]  Cardiologist:  Freada Bergeron, MD  Advanced Practice Provider:  No care team member to display Electrophysiologist:  None    Referring MD: Leamon Arnt, MD    History of Present Illness:    [name] is a 75 y.o. 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 She now presents to clinic for follow-up.  Patient was diagnosed with new onset cardiomyopathy in 08/2020 when she presented with SOB found to have COVID at that time. TTE with LVEF 20-25%. Was initially stable and discharged home but returned to [facility] on 08/10/2020 with progressive shortness of breath and LE edema requiring ICU admission and intubation. She was aggressively diuresed and extubated and evenutally weaned to RA. Initial weight was 205lbs and she was diuresed to 187lbs. She was discharged home on lasix 40mg  daily.  After discharge, we arranged for coronary angiography on 09/07/20 for work-up of newly diagnosed HFrEF which revealed no significant obstructive disease. She returned to clinic on 09/21/20 where she felt overall improved. Had DOE with stairs. Wt was stable and blood pressure was well controlled. Repeat TTE 10/15/20 with mild improvement in LVEF to 30-35%, G2DD, elevated LAP, normal RA, mildly dilated LA, mild MR. Cardiac monitor that was placed for frequent ventricular ectopy revealed several runs of NSVT and frequent PVCs but no sustained arrhythmias or Afib.  During last visit on 10/20/20, she was overall feeling better. She was euvolemic. We increased her entresto and started spironolactone.  Today, she reports doing  well. She feels like she has more energy since her entresto was increased. She has occasional palpitations, but they are not bothersome. Denies any chest pain, lightheadedness, dizziness, or LE edema. She continues to monitor her weight and is compliant with her medications. She is active and walks for formal exercise.  She requests contacting Dr. Veverly Fells at [facility] for clearance for right shoulder surgery.    Past Medical History:  Diagnosis Date  . Arthritis   . Chronic back pain    buldging disc,scoliosis,arthritis  . Congestive heart failure with left ventricular systolic dysfunction ([place]) 08/24/2020  . Diabetes mellitus without complication ([place])    takes Trulicity,Jardiance,and Metformin daily.Average fasting blood sugar runs around130  . GERD (gastroesophageal reflux disease)    takes Omeprazole daily  . History of bronchitis > 8 yrs ago  . History of shingles   . HTN (hypertension)    takes Amlodipine and Micardis daily  . Hx of colonic polyps    benign  . Internal and external hemorrhoids without complication   . Joint pain   . Mitral valve prolapse   . Nocturia   . PONV (postoperative nausea and vomiting)    when gets injections in joints gets hives.Betadine rash  . Restless leg syndrome    takes Requip at bedtime  . Seasonal allergies    takes Claritin daily as needed  . Urinary frequency   . Uterine fibroid     Past Surgical History:  Procedure Laterality Date  . BUNIONECTOMY Bilateral   . COLONOSCOPY  07/12/2004   diverticulosis, internal and external hemorrhoids  . COLONOSCOPY    .  FINGER ARTHROSCOPY WITH CARPOMETACARPEL [facility]) ARTHROPLASTY Right 09/28/2015   Procedure: RIGHT THUMB TRAPEZIUM EXCISION WITH CARPOMETACARPEL ([place]) ARTHROPLASTY AND TENDON TRANSFER;  Surgeon: Iran Planas, MD;  Location: [place];  Service: Orthopedics;  Laterality: Right;  . HEMORRHOID SURGERY     almost 40 yrs ago  . HYSTEROSCOPY WITH D & C  08/23/2000   and resectoscopic  myomectomy  . LUMBAR DISC SURGERY  03/16/2005  . LUMBAR EPIDURAL INJECTION    . PLANTAR FASCIA RELEASE Left 02/03/2010   and torn tendon  . RIGHT/LEFT HEART CATH AND CORONARY ANGIOGRAPHY N/A 09/07/2020   Procedure: RIGHT/LEFT HEART CATH AND CORONARY ANGIOGRAPHY;  Surgeon: Nelva Bush, MD;  Location: [place] CV [facility];  Service: Cardiovascular;  Laterality: N/A;  . TENDON TRANSFER Right 09/28/2015   Procedure: TENDON TRANSFER;  Surgeon: Iran Planas, MD;  Location: [place];  Service: Orthopedics;  Laterality: Right;  . TONSILLECTOMY    . TUBAL LIGATION    . WRIST SURGERY     left, removal of cyst    Current Medications: Current Meds  Medication Sig  . allopurinol (ZYLOPRIM) 100 MG tablet Take 100 mg by mouth daily.  . carvedilol (COREG) 3.125 MG tablet Take 1 tablet (3.125 mg total) by mouth 2 (two) times daily.  . cholecalciferol (VITAMIN D) 1000 UNITS tablet Take 1,000 Units by mouth daily.  . cycloSPORINE (RESTASIS) 0.05 % ophthalmic emulsion Place 1 drop into both eyes 2 (two) times daily.  . dapagliflozin propanediol (FARXIGA) 10 MG TABS tablet Take 1 tablet (10 mg total) by mouth daily before breakfast.  . Dulaglutide 0.75 MG/0.5ML SOPN Inject 0.75 mg into the skin once a week.  . fluticasone (FLONASE) 50 MCG/ACT nasal spray Place 1 spray into both nostrils daily as needed for allergies.  . Magnesium Oxide 400 MG CAPS Take 1 capsule (400 mg total) by mouth daily.  . metFORMIN (GLUCOPHAGE XR) 750 MG 24 hr tablet Take 2 tablets (1,500 mg total) by mouth daily with breakfast.  . montelukast (SINGULAIR) 10 MG tablet Take 10 mg by mouth at bedtime.  . Multiple Vitamin (MULTIVITAMIN WITH MINERALS) TABS tablet Take 1 tablet by mouth daily.  . NUCYNTA 50 MG tablet Take 50 mg by mouth 3 (three) times daily.  Marland Kitchen omeprazole (PRILOSEC) 40 MG capsule Take 40 mg by mouth daily.  . potassium chloride (KLOR-CON) 10 MEQ tablet Take 1 tablet (10 mEq total) by mouth daily.  . pregabalin (LYRICA) 100  MG capsule Take 1 capsule (100 mg total) by mouth 2 (two) times daily.  Marland Kitchen rOPINIRole (REQUIP) 3 MG tablet Take 3 mg by mouth at bedtime.  . rosuvastatin (CRESTOR) 20 MG tablet Take 1 tablet (20 mg total) by mouth daily.  . sacubitril-valsartan (ENTRESTO) 49-51 MG Take 1 tablet by mouth 2 (two) times daily.  Marland Kitchen spironolactone (ALDACTONE) 25 MG tablet Take 0.5 tablets (12.5 mg total) by mouth daily.  . [DISCONTINUED] furosemide (LASIX) 20 MG tablet Take one tablet (20mg ) on Monday, Wednesday and Friday.     Allergies:   Amlodipine, Depo-medrol [methylprednisolone], Gabapentin, Levothyroxine, Sitagliptin, Ace inhibitors, Betadine [povidone iodine], Codeine, Cortizone-10 [hydrocortisone], Tetracyclines & related, and Dexamethasone   Social History   Socioeconomic History  . Marital status: Married    Spouse name: Not on file  . Number of children: 3  . Years of education: Not on file  . Highest education level: Not on file  Occupational History  . Occupation: Retired   Tobacco Use  . Smoking status: Never Smoker  .  Smokeless tobacco: Never Used  Substance and Sexual Activity  . Alcohol use: No    Alcohol/week: 0.0 standard drinks  . Drug use: No  . Sexual activity: Yes  Other Topics Concern  . Not on file  Social History Narrative   1 caffeine drinks daily         Social Determinants of Health   Financial Resource Strain: Not on file  Food Insecurity: Not on file  Transportation Needs: Not on file  Physical Activity: Not on file  Stress: Not on file  Social Connections: Not on file     Family History: The patient's family history includes Pancreatic cancer in her father. There is no history of Colon cancer or Breast cancer.  ROS:   Please see the history of present illness.    All other systems reviewed and are negative.  EKGs/Labs/Other Studies Reviewed:    The following studies were reviewed today: TTE 10/29/20: 1. Left ventricular ejection fraction, by  estimation, is 30 to 35%. The  left ventricle has moderately decreased function. The left ventricle  demonstrates global hypokinesis. Left ventricular diastolic parameters are  consistent with Grade II diastolic  dysfunction (pseudonormalization). Elevated left atrial pressure. The E/e'  is 22.0.  2. Right ventricular systolic function is normal. The right ventricular  size is normal. Tricuspid regurgitation signal is inadequate for assessing  PA pressure.  3. Left atrial size was mildly dilated.  4. The mitral valve is grossly normal. Mild mitral valve regurgitation.  No evidence of mitral stenosis.  5. The aortic valve is tricuspid. Aortic valve regurgitation is not  visualized. No aortic stenosis is present.  6. The inferior vena cava is normal in size with greater than 50%  respiratory variability, suggesting right atrial pressure of 3 mmHg.   Cardiac Monitor 10/09/20:  Patient's monitoring period was from 09/08/20-10/07/20  Predominant rhythm was NSR with frequent PVCs. Average HR 84bpm (ranged from 59-84bpm)  There were several runs of NSVT, however, no sustained VT (longest episode was 11 beats)  Frequent ventricular ectopy was detected (10%; 314,307 beats)  SVE was rare <1%  No afib or significant pauses  Patient triggered events correlated with PVCs and NSVT.  TTE 08/14/20: 1. Left ventricular ejection fraction, by estimation, is 20 to 25%. The  left ventricle has severely decreased function. The left ventricle has no  regional wall motion abnormalities. Left ventricular diastolic parameters  are indeterminate.  2. Right ventricular systolic function was not well visualized. The right  ventricular size is not well visualized.  3. Left atrial size was moderately dilated.  4. The mitral valve is grossly normal. Mild mitral valve regurgitation.  There is a second jet of mitral regurgitation seen in the four chamber  view with eccentric pattern, mitral  regurgitation may be underestimated in  this study.  5. The aortic valve is tricuspid. Aortic valve regurgitation is not  visualized. No aortic stenosis is present.  6. The inferior vena cava is dilated in size with <50% respiratory  variability, suggesting right atrial pressure of 15 mmHg.   TTE 08/04/20 IMPRESSIONS  1. Left ventricular ejection fraction, by estimation, is 20 to 25%. The  left ventricle has severely decreased function. The left ventricle  demonstrates global hypokinesis. The left ventricular internal cavity size  was mildly to moderately dilated. Left  ventricular diastolic parameters are consistent with Grade II diastolic  dysfunction (pseudonormalization). Elevated left atrial pressure.  2. Right ventricular systolic function is normal. The right ventricular  size is  normal. There is mildly elevated pulmonary artery systolic  pressure. The estimated right ventricular systolic pressure is 44.0 mmHg.  3. Left atrial size was moderately dilated.  4. The mitral valve is degenerative. Mild mitral valve regurgitation. No  evidence of mitral stenosis.  5. The aortic valve is tricuspid. There is mild calcification of the  aortic valve. There is mild thickening of the aortic valve. Aortic valve  regurgitation is not visualized. Mild aortic valve sclerosis is present,  with no evidence of aortic valve  stenosis.  6. The inferior vena cava is dilated in size with <50% respiratory  variability, suggesting right atrial pressure of 15 mmHg.   FINDINGS  Left Ventricle: Left ventricular ejection fraction, by estimation, is 20  to 25%. The left ventricle has severely decreased function. The left  ventricle demonstrates global hypokinesis. The left ventricular internal  cavity size was mildly to moderately  dilated. There is no left ventricular hypertrophy. Left ventricular  diastolic parameters are consistent with Grade II diastolic dysfunction  (pseudonormalization).  Elevated left atrial pressure.   Right Ventricle: The right ventricular size is normal. No increase in  right ventricular wall thickness. Right ventricular systolic function is  normal. There is mildly elevated pulmonary artery systolic pressure. The  tricuspid regurgitant velocity is 2.39  m/s, and with an assumed right atrial pressure of 15 mmHg, the estimated  right ventricular systolic pressure is 10.2 mmHg.   Left Atrium: Left atrial size was moderately dilated.   Right Atrium: Right atrial size was normal in size.   Pericardium: Trivial pericardial effusion is present.   Mitral Valve: The mitral valve is degenerative in appearance. Mild mitral  annular calcification. Mild mitral valve regurgitation. No evidence of  mitral valve stenosis.   Tricuspid Valve: The tricuspid valve is grossly normal. Tricuspid valve  regurgitation is mild . No evidence of tricuspid stenosis.   Aortic Valve: The aortic valve is tricuspid. There is mild calcification  of the aortic valve. There is mild thickening of the aortic valve. Aortic  valve regurgitation is not visualized. Mild aortic valve sclerosis is  present, with no evidence of aortic  valve stenosis.   Pulmonic Valve: The pulmonic valve was grossly normal. Pulmonic valve  regurgitation is trivial. No evidence of pulmonic stenosis.   Aorta: The aortic root and ascending aorta are structurally normal, with  no evidence of dilitation.   Venous: The inferior vena cava is dilated in size with less than 50%  respiratory variability, suggesting right atrial pressure of 15 mmHg.   IAS/Shunts: The atrial septum is grossly normal.   RHC/LHC 09/07/20: Conclusions: 1. Mild plaquing of the mid LAD with 10-20% stenosis. Otherwise, no angiographically significant coronary artery disease. 2. Normal left and right heart filling pressures. 3. Borderline elevated pulmonary artery pressure. 4. Normal Fick cardiac  output/index.  Recommendations: 1. Maintain net even fluid balance and optimize goal directed medical therapy for non-ischemic cardiomyopathy. 2. Primary    EKG:   11/11/2020: EKG is not ordered today.    Recent Labs: 08/03/2020: TSH 2.701 08/16/2020: Magnesium 2.1 08/19/2020: B Natriuretic Peptide 146.3 08/20/2020: ALT 45 08/24/2020: Platelets 447.0 09/07/2020: Hemoglobin 10.9 10/28/2020: BUN 32; Creatinine, Ser 1.34; Potassium 4.7; Sodium 142  Recent Lipid Panel    Component Value Date/Time   TRIG 194 (H) 08/13/2020 1035      Physical Exam:    VS:  BP 120/72   Pulse 71   Ht 5\' 4"  (1.626 m)   Wt 188 lb 12.8 oz (85.6  kg)   SpO2 95%   BMI 32.41 kg/m     Wt Readings from Last 3 Encounters:  11/11/20 188 lb 12.8 oz (85.6 kg)  10/20/20 188 lb 9.6 oz (85.5 kg)  10/06/20 189 lb 9.6 oz (86 kg)     GEN: Well nourished, well developed in no acute distress HEENT: Normal NECK: No JVD; No carotid bruits LYMPHATICS: No lymphadenopathy CARDIAC: RRR, 1/6 systolic murmur, no rubs, gallops RESPIRATORY:  Clear to auscultation without rales, wheezing or rhonchi  ABDOMEN: Soft, non-tender, non-distended MUSCULOSKELETAL:  No edema; No deformity  SKIN: Warm and dry NEUROLOGIC:  Alert and oriented x 3 PSYCHIATRIC:  Normal affect   ASSESSMENT:    1. Congestive heart failure with left ventricular systolic dysfunction ([place])   2. Hoarseness   3. HFrEF (heart failure with reduced ejection fraction) ([place])   4. PVC (premature ventricular contraction)   5. Nonischemic cardiomyopathy ([place])   6. Controlled type 2 diabetes mellitus without complication, without long-term current use of insulin ([place])   7. Hypertension associated with diabetes ([place])   8. Stage 3a chronic kidney disease ([facility])    PLAN:    In order of problems listed above:  #Newly diagnosed HFrEF:  #Non-ischemic cardiomypathy: Diagnosed in the setting of COVID-19 pneumonia. TTE with globally depressed systolic function. Trop  only mildly elevated.Cath with mild, non-obstructive disease. Initial TTE with LVEF 20-25%-->now 30-35%. Currently, patient is improving with NYHA class II symptoms. Euvolemic on exam today -Continue lasix 20mg  4x/wk (M, W, F, Sat) -Continue K supplementation -Continuecoreg to3.125mg  BID -Continue entresto to 49/51mg  BID--will up-titrate as able at next visit -Continue farxiga 10mg  daily -Continue spironolactone 12.5mg  daily  -Cath without obstructive disease -If no improvement in EF despite 90 days of OMT, will need ICD -Continue daily weights and call if gaining >2-3lbs in 2 days or 5lbs in 1 week -Low Na diet -If EF remains low after 3 months of GDMT, will need ICD -TTE in June to reassess LVEF after 90days OMT  #Non-obstructive CAD: Cath on 09/07/20 with mild plaquing of the mid LAD with 10-20% stenosis. Otherwise, no angiographically significant coronary artery disease. -Continue ASA and crestor  #PVCs/NSVT: Cardiac monitor with several runs of NSVT (longest 11 beats) and frequent (10%) burden of PVCs. No sustained arrhythmias. -Monitor electrolytes carefully with diuresis -Continue coreg as above -Manage HF as above  #CKD: Cr back to baseline. -Lasix as above -Follow-up with PCP as scheduled  #DMII: -Continue farxiga  #History of ZOXWR-60 Pneumonia: Complicated by profound hypoxialikely compounded by pulmonary edema requiring re-hospitalization as detailed above. Required intubation and was diuresed successfuly and weaned to RA.Doing much better from a symptom standpoint. -Resolved respiratory distress  #Right Shoulder Surgery: Patient now medically optimized from CV standpoint; okay to proceed.  -Continue current medications -Can hold aspirin if needed for the procedure and resume afterwards      Medication Adjustments/Labs and Tests Ordered: Current medicines are reviewed at length with the patient today.  Concerns regarding medicines are outlined  above.  Orders Placed This Encounter  Procedures  . Ambulatory referral to ENT  . ECHOCARDIOGRAM COMPLETE   Meds ordered this encounter  Medications  . furosemide (LASIX) 20 MG tablet    Sig: Take one tablet (20mg ) on Monday, Wednesday and Friday and Sat.    Dispense:  60 tablet    Refill:  1    Dose increase    Patient Instructions  Medication Instructions:   INCREASE YOUR LASIX TO TAKING 20 MG BY MOUTH  ON MONDAYS, WEDNESDAYS, FRIDAYS, AND SATURDAYS ONLY  *If you need a refill on your cardiac medications before your next appointment, please call your pharmacy*   You have been referred to ENT FOR CHRONIC HOARSENESS    Testing/Procedures:  Your physician has requested that you have an echocardiogram. Echocardiography is a painless test that uses sound waves to create images of your heart. It provides your doctor with information about the size and shape of your heart and how well your heart's chambers and valves are working. This procedure takes approximately one hour. There are no restrictions for this procedure.  PLEASE SCHEDULE ECHO AT END OF June 2022 PER DR. Latina Craver DONE END OF June BEFORE SEEING HER FOR 3 MONTH IN JULY   Follow-Up:  3 MONTHS IN THE OFFICE WITH DR. Isaiah Blakes HAVE YOUR ECHO DONE AT THE END OF June 2022, THEN SEE HER FOR FOLLOW-UP IN July       Follow-up in 3 months.  I,Mathew Stumpf,acting as a Education administrator for Freada Bergeron, MD.,have documented all relevant documentation on the behalf of Freada Bergeron, MD,as directed by  Freada Bergeron, MD while in the presence of Freada Bergeron, MD.  I, Freada Bergeron, MD, have reviewed all documentation for this visit. The documentation on 11/11/20 for the exam, diagnosis, procedures, and orders are all accurate and complete.  Signed, Freada Bergeron, MD  11/11/2020 11:19 AM    [place]

## 2020-11-11 NOTE — Patient Instructions (Signed)
Medication Instructions:   INCREASE YOUR LASIX TO TAKING 20 MG BY MOUTH  ON MONDAYS, WEDNESDAYS, FRIDAYS, AND SATURDAYS ONLY  *If you need a refill on your cardiac medications before your next appointment, please call your pharmacy*   You have been referred to ENT FOR CHRONIC HOARSENESS    Testing/Procedures:  Your physician has requested that you have an echocardiogram. Echocardiography is a painless test that uses sound waves to create images of your heart. It provides your doctor with information about the size and shape of your heart and how well your heart's chambers and valves are working. This procedure takes approximately one hour. There are no restrictions for this procedure.  PLEASE SCHEDULE ECHO AT END OF June 2022 PER DR. Latina Craver DONE END OF June BEFORE SEEING HER FOR 3 MONTH IN JULY   Follow-Up:  3 MONTHS IN THE OFFICE WITH DR. Isaiah Blakes HAVE YOUR ECHO DONE AT THE END OF June 2022, THEN SEE HER FOR FOLLOW-UP IN July

## 2020-11-27 ENCOUNTER — Telehealth: Payer: Self-pay

## 2020-11-27 ENCOUNTER — Telehealth: Payer: Self-pay | Admitting: Cardiology

## 2020-11-27 NOTE — Telephone Encounter (Signed)
Patient is scheduled   

## 2020-11-27 NOTE — Telephone Encounter (Signed)
Pt c/o of Chest Pain: STAT if CP now or developed within 24 hon her ribs and  going through around into her back  2. Are you experiencing any other symptoms (ex. SOB, nausea, vomiting, sweating)? Nausea on Tuesday when it first started  3. How long have you been experiencing CP? Tuesday  4. Is your CP continuous or coming and going?  Stays  5. Have you taken Nitroglycerin? Never had any ?

## 2020-11-27 NOTE — Telephone Encounter (Signed)
Please advise 

## 2020-11-27 NOTE — Telephone Encounter (Signed)
Nurse Assessment Nurse: Clovis Riley RN, Georgina Peer Date/Time (Eastern Time): 11/27/2020 9:22:49 AM Confirm and document reason for call. If symptomatic, describe symptoms. ---Caller states that she is having pain under the left breast that shoots to her back that started on tuesday. Pain is worse with movement and pulling on things. Pain is 4/10 but is a 6/10 with movement. Does the patient have any new or worsening symptoms? ---Yes Will a triage be completed? ---Yes Related visit to physician within the last 2 weeks? ---No Does the PT have any chronic conditions? (i.e. diabetes, asthma, this includes High risk factors for pregnancy, etc.) ---Yes List chronic conditions. ---Heart condition Is this a behavioral health or substance abuse call? ---No Guidelines Guideline Title Affirmed Question Affirmed Notes Nurse Date/Time Eilene Ghazi Time) Chest Pain [1] Chest pain lasts > 5 minutes AND [2] occurred in past 3 days (72 hours) (Exception: feels exactly the same as previously diagnosed Clovis Riley, RN, Georgina Peer 11/27/2020 9:25:06 AM PLEASE NOTE: All timestamps contained within this report are represented as [place] Standard Time. CONFIDENTIALTY NOTICE: This fax transmission is intended only for the addressee. It contains information that is legally privileged, confidential or otherwise protected from use or disclosure. If you are not the intended recipient, you are strictly prohibited from reviewing, disclosing, copying using or disseminating any of this information or taking any action in reliance on or regarding this information. If you have received this fax in error, please notify us immediately by telephone so that we can arrange for its return to [place]. Phone: [phone], Toll-Free: [phone], Fax: [phone] Page: 2 of 2 Call Id: [id number] Guidelines Guideline Title Affirmed Question Affirmed Notes Nurse Date/Time Eilene Ghazi Time) heartburn and has accompanying sour taste in mouth) Disp.  Time Eilene Ghazi Time) Disposition Final User 11/27/2020 9:07:35 AM Send to Urgent Ala Dach 11/27/2020 9:11:35 AM Attempt made - message left Clovis Riley, RN, Georgina Peer 11/27/2020 9:28:13 AM Go to ED Now (or PCP triage) Yes Clovis Riley, RN, Leilani Merl Disagree/Comply Comply Caller Understands Yes PreDisposition Did not know what to do Care Advice Given Per Guideline GO TO ED NOW (OR PCP TRIAGE): * IF NO PCP (PRIMARY CARE PROVIDER) SECOND-LEVEL TRIAGE: You need to be seen within the next hour. Go to the [facility] at _____________ [place] as soon as you can. BRING MEDICINES: * It is also a good idea to bring the pill bottles too. This will help the doctor to make certain you are taking the right medicines and the right dose. * Please bring a list of your current medicines when you go to see the doctor. CARE ADVICE given per Chest Pain (Adult) guideline. CALL EMS IF: * Severe difficulty breathing occurs * Passes out or becomes too weak to stand * You become worse Referrals Warm transfer to [facility]

## 2020-11-27 NOTE — Telephone Encounter (Signed)
Pt was cleared by cards. Reviewed that message.  Needs OV: please get her scheduled.   Fyi: when I am out of the office these need to be reviewed by onsite provider

## 2020-11-27 NOTE — Telephone Encounter (Signed)
[name] reports continuous pain in her L breast/rib cage area and around her back since Tuesday.  Originally, the pain occurred when breathing in deep breaths. Now it is continuous. The pain is worse when she raises her arms or bends over to pull her pants up when she goes to the bathroom.  She denies other symptoms, specifically SOB. She does not remember hurting herself/pulling a muscle.  She did have Covid in January with a complicated hospital course. She underwent coronary angiography on 09/07/20 for work-up of newly diagnosed HFrEFwhich revealed mild plaquing of the mid LAD with 10-20% stenosis. Otherwise, no angiographically significant coronary artery disease.  Reiterated to her that given recent Covid, worsening symptoms upon movement of limbs, and recent cath showing no obstructive coronary disease, her symptoms are likely not cardiac and could be musculoskeletal or residual . Instructed her to call Dr. Jonni Sanger for recommendations. She understands to call if symptoms worsen or if Dr. Jonni Sanger thinks Cardiology is needed. She was grateful for call and agrees with plan.

## 2020-12-02 ENCOUNTER — Ambulatory Visit: Payer: Medicare Other | Admitting: Family Medicine

## 2020-12-02 ENCOUNTER — Encounter: Payer: Self-pay | Admitting: Family Medicine

## 2020-12-02 ENCOUNTER — Other Ambulatory Visit: Payer: Self-pay

## 2020-12-02 VITALS — BP 110/72 | HR 79 | Temp 97.3°F | Resp 16 | Ht 64.0 in | Wt 189.8 lb

## 2020-12-02 DIAGNOSIS — R0789 Other chest pain: Secondary | ICD-10-CM

## 2020-12-02 NOTE — Progress Notes (Signed)
Subjective  CC:  Chief Complaint  Patient presents with  . Breast Pain    Under left breast, moves around to the mid back. Aching pain, comes and goes. Once up and moving pain worsens. No shortness of breath    HPI: [name] is a 75 y.o. female who presents to the office today to address the problems listed above in the chief complaint.  75 year old female with complicated past medical history presents due to left pain (rest.  I reviewed notes from last week.  About 5 days ago she experienced abrupt onset of pain beneath her left breast that radiated around to the back.  Pain was worse with movement and deep breathing.  She denies systemic symptoms, specifically no fevers, shortness of breath rash, malaise, nausea, vomiting, decreased appetite.  She has been gardening but has not done anything strenuous.  She does not remember an injury.  She has never developed a rash.  Pain does not interfere with sleep.  If she sat still or lying down, she was pain-free.  Over the last week, her pain has almost completely resolved.  No new symptoms.   Assessment  1. Left-sided chest wall pain      Plan   Left-sided chest wall pain: Resolved.  Most consistent with musculoskeletal etiology.  We will continue to monitor.  No red flags identified today.  Follow up: As scheduled 01/14/2021  No orders of the defined types were placed in this encounter.  No orders of the defined types were placed in this encounter.     I reviewed the patients updated PMH, FH, and SocHx.    Patient Active Problem List   Diagnosis Date Noted  . Nonischemic cardiomyopathy ([place]) 10/06/2020  . Acute HFrEF (heart failure with reduced ejection fraction) ([place])   . Congestive heart failure with left ventricular systolic dysfunction ([place]) 08/24/2020  . Pressure injury of skin 08/14/2020  . 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 Allergy to statin medication 06/16/2020  . DDD (degenerative disc  disease), cervical 04/30/2020  . Statin intolerance 08/22/2019  . Osteoarthritis of left knee 11/05/2018  . Chronic pansinusitis 10/04/2017  . Chronic low back pain 09/05/2017  . Complication of surgical procedure 09/05/2017  . Degenerative lumbar disc 06/04/2015  . S/P lumbar laminectomy 06/04/2015  . Restless leg syndrome 05/13/2014  . Controlled type 2 diabetes mellitus without complication, without long-term current use of insulin ([place]) 04/09/2013  . Spinal stenosis of lumbar region without neurogenic claudication 09/12/2012  . GERD (gastroesophageal reflux disease) 05/25/2011  . Hemorrhoids, internal, with bleeding 05/25/2011  . Obesity 05/25/2011  . Hypertension associated with diabetes ([place]) 11/08/2010  . Chronic allergic rhinitis 08/19/2010  . Mitral valve prolapse 10/10/2008   Current Meds  Medication Sig  . allopurinol (ZYLOPRIM) 100 MG tablet Take 100 mg by mouth daily.  . carvedilol (COREG) 3.125 MG tablet Take 1 tablet (3.125 mg total) by mouth 2 (two) times daily.  . cholecalciferol (VITAMIN D) 1000 UNITS tablet Take 1,000 Units by mouth daily.  . cycloSPORINE (RESTASIS) 0.05 % ophthalmic emulsion Place 1 drop into both eyes 2 (two) times daily.  . dapagliflozin propanediol (FARXIGA) 10 MG TABS tablet Take 1 tablet (10 mg total) by mouth daily before breakfast.  . Dulaglutide 0.75 MG/0.5ML SOPN Inject 0.75 mg into the skin once a week.  . fluticasone (FLONASE) 50 MCG/ACT nasal spray Place 1 spray into both nostrils daily as needed for allergies.  . furosemide (LASIX) 20 MG tablet  Take one tablet (20mg ) on Monday, Wednesday and Friday and Sat.  . Magnesium Oxide 400 MG CAPS Take 1 capsule (400 mg total) by mouth daily.  . metFORMIN (GLUCOPHAGE XR) 750 MG 24 hr tablet Take 2 tablets (1,500 mg total) by mouth daily with breakfast.  . montelukast (SINGULAIR) 10 MG tablet Take 10 mg by mouth at bedtime.  . Multiple Vitamin (MULTIVITAMIN WITH MINERALS) TABS tablet Take 1 tablet  by mouth daily.  . NUCYNTA 50 MG tablet Take 50 mg by mouth at bedtime.  Marland Kitchen omeprazole (PRILOSEC) 40 MG capsule Take 40 mg by mouth daily.  . potassium chloride (KLOR-CON) 10 MEQ tablet Take 1 tablet (10 mEq total) by mouth daily.  . pregabalin (LYRICA) 100 MG capsule Take 1 capsule (100 mg total) by mouth 2 (two) times daily.  Marland Kitchen rOPINIRole (REQUIP) 3 MG tablet Take 3 mg by mouth at bedtime.  . rosuvastatin (CRESTOR) 20 MG tablet Take 1 tablet (20 mg total) by mouth daily.  . sacubitril-valsartan (ENTRESTO) 49-51 MG Take 1 tablet by mouth 2 (two) times daily.  Marland Kitchen spironolactone (ALDACTONE) 25 MG tablet Take 0.5 tablets (12.5 mg total) by mouth daily.    Allergies: Patient is allergic to amlodipine, depo-medrol [methylprednisolone], gabapentin, levothyroxine, sitagliptin, ace inhibitors, betadine [povidone iodine], codeine, cortizone-10 [hydrocortisone], tetracyclines & related, and dexamethasone. Family History: Patient family history includes Pancreatic cancer in her father. Social History:  Patient  reports that she has never smoked. She has never used smokeless tobacco. She reports that she does not drink alcohol and does not use drugs.  Review of Systems: Constitutional: Negative for fever malaise or anorexia Cardiovascular: negative for chest pain Respiratory: negative for SOB or persistent cough Gastrointestinal: negative for abdominal pain  Objective  Vitals: BP 110/72   Pulse 79   Temp (!) 97.3 F (36.3 C) (Temporal)   Resp 16   Ht 5\' 4"  (1.626 m)   Wt 189 lb 12.8 oz (86.1 kg)   SpO2 96%   BMI 32.58 kg/m  General: no acute distress , A&Ox3, moving to and from exam table easily HEENT: PEERL, conjunctiva normal, neck is supple Cardiovascular:  RRR  Respiratory:  Good breath sounds bilaterally, CTAB with normal respiratory effort No chest wall tenderness     Commons side effects, risks, benefits, and alternatives for medications and treatment plan prescribed today were  discussed, and the patient expressed understanding of the given instructions. Patient is instructed to call or message via MyChart if he/she has any questions or concerns regarding our treatment plan. No barriers to understanding were identified. We discussed Red Flag symptoms and signs in detail. Patient expressed understanding regarding what to do in case of urgent or emergency type symptoms.   Medication list was reconciled, printed and provided to the patient in AVS. Patient instructions and summary information was reviewed with the patient as documented in the AVS. This note was prepared with assistance of Dragon voice recognition software. Occasional wrong-word or sound-a-like substitutions may have occurred due to the inherent limitations of voice recognition software  This visit occurred during the SARS-CoV-2 public health emergency.  Safety protocols were in place, including screening questions prior to the visit, additional usage of staff PPE, and extensive cleaning of exam room while observing appropriate contact time as indicated for disinfecting solutions.

## 2020-12-02 NOTE — Progress Notes (Signed)
Sent message, via epic in basket, requesting orders in epic from surgeon.  

## 2020-12-03 NOTE — H&P (Signed)
Patient's anticipated LOS is less than 2 midnights, meeting these requirements: - Younger than 27 - Lives within 1 hour of care - Has a competent adult at home to recover with post-op recover - NO history of  - Chronic pain requiring opiods  - Diabetes  - Coronary Artery Disease  - Heart failure  - Heart attack  - Stroke  - DVT/VTE  - Cardiac arrhythmia  - Respiratory Failure/COPD  - Renal failure  - Anemia  - Advanced Liver disease       [name] is an 75 y.o. female.    Chief Complaint: right shoulder pain  HPI: Pt is a 75 y.o. female complaining of right shoulder pain for multiple years. Pain had continually increased since the beginning. X-rays in the clinic show end-stage arthritic changes of the right shoulder. Pt has tried various conservative treatments which have failed to alleviate their symptoms, including injections and therapy. Various options are discussed with the patient. Risks, benefits and expectations were discussed with the patient. Patient understand the risks, benefits and expectations and wishes to proceed with surgery.   PCP:  Leamon Arnt, MD  D/C Plans: Home  PMH: Past Medical History:  Diagnosis Date  . Arthritis   . Chronic back pain    buldging disc,scoliosis,arthritis  . Congestive heart failure with left ventricular systolic dysfunction ([place]) 08/24/2020  . Diabetes mellitus without complication ([place])    takes Trulicity,Jardiance,and Metformin daily.Average fasting blood sugar runs around130  . GERD (gastroesophageal reflux disease)    takes Omeprazole daily  . History of bronchitis > 8 yrs ago  . History of shingles   . HTN (hypertension)    takes Amlodipine and Micardis daily  . Hx of colonic polyps    benign  . Internal and external hemorrhoids without complication   . Joint pain   . Mitral valve prolapse   . Nocturia   . PONV (postoperative nausea and vomiting)    when gets injections in joints gets hives.Betadine rash   . Restless leg syndrome    takes Requip at bedtime  . Seasonal allergies    takes Claritin daily as needed  . Urinary frequency   . Uterine fibroid     PSH: Past Surgical History:  Procedure Laterality Date  . BUNIONECTOMY Bilateral   . COLONOSCOPY  07/12/2004   diverticulosis, internal and external hemorrhoids  . COLONOSCOPY    . FINGER ARTHROSCOPY WITH CARPOMETACARPEL [facility]) ARTHROPLASTY Right 09/28/2015   Procedure: RIGHT THUMB TRAPEZIUM EXCISION WITH CARPOMETACARPEL ([place]) ARTHROPLASTY AND TENDON TRANSFER;  Surgeon: Iran Planas, MD;  Location: [facility];  Service: Orthopedics;  Laterality: Right;  . HEMORRHOID SURGERY     almost 40 yrs ago  . HYSTEROSCOPY WITH D & C  08/23/2000   and resectoscopic myomectomy  . LUMBAR DISC SURGERY  03/16/2005  . LUMBAR EPIDURAL INJECTION    . PLANTAR FASCIA RELEASE Left 02/03/2010   and torn tendon  . RIGHT/LEFT HEART CATH AND CORONARY ANGIOGRAPHY N/A 09/07/2020   Procedure: RIGHT/LEFT HEART CATH AND CORONARY ANGIOGRAPHY;  Surgeon: Nelva Bush, MD;  Location: [facility];  Service: Cardiovascular;  Laterality: N/A;  . TENDON TRANSFER Right 09/28/2015   Procedure: TENDON TRANSFER;  Surgeon: Iran Planas, MD;  Location: [facility];  Service: Orthopedics;  Laterality: Right;  . TONSILLECTOMY    . TUBAL LIGATION    . WRIST SURGERY     left, removal of cyst    Social History:  reports that she has never  smoked. She has never used smokeless tobacco. She reports that she does not drink alcohol and does not use drugs.  Allergies:  Allergies  Allergen Reactions  . Amlodipine Swelling  . Depo-Medrol [Methylprednisolone] Hives  . Gabapentin Rash    hives   . Levothyroxine Other (See Comments)    severe muscle pain in neck    . Sitagliptin Other (See Comments)    Urinary hesitancy Januvia  . Ace Inhibitors     cough  . Betadine [Povidone Iodine]     rash  . Codeine Nausea And Vomiting  . Cortizone-10 [Hydrocortisone] Other (See Comments)     unknown  . Tetracyclines & Related Nausea And Vomiting  . Dexamethasone Rash    Medications: No current facility-administered medications for this encounter.   Current Outpatient Medications  Medication Sig Dispense Refill  . allopurinol (ZYLOPRIM) 100 MG tablet Take 100 mg by mouth daily.    . carvedilol (COREG) 3.125 MG tablet Take 1 tablet (3.125 mg total) by mouth 2 (two) times daily. 180 tablet 3  . cholecalciferol (VITAMIN D) 1000 UNITS tablet Take 1,000 Units by mouth daily.    . cycloSPORINE (RESTASIS) 0.05 % ophthalmic emulsion Place 1 drop into both eyes 2 (two) times daily.    . dapagliflozin propanediol (FARXIGA) 10 MG TABS tablet Take 1 tablet (10 mg total) by mouth daily before breakfast. 90 tablet 1  . Dulaglutide 0.75 MG/0.5ML SOPN Inject 0.75 mg into the skin once a week.    . fluticasone (FLONASE) 50 MCG/ACT nasal spray Place 1 spray into both nostrils daily as needed for allergies.    . furosemide (LASIX) 20 MG tablet Take one tablet (20mg ) on Monday, Wednesday and Friday and Sat. 60 tablet 1  . Magnesium Oxide 400 MG CAPS Take 1 capsule (400 mg total) by mouth daily. 90 capsule 3  . metFORMIN (GLUCOPHAGE XR) 750 MG 24 hr tablet Take 2 tablets (1,500 mg total) by mouth daily with breakfast. 180 tablet 3  . montelukast (SINGULAIR) 10 MG tablet Take 10 mg by mouth at bedtime.    . Multiple Vitamin (MULTIVITAMIN WITH MINERALS) TABS tablet Take 1 tablet by mouth daily.    . NUCYNTA 50 MG tablet Take 50 mg by mouth at bedtime.    Marland Kitchen omeprazole (PRILOSEC) 40 MG capsule Take 40 mg by mouth daily.    . potassium chloride (KLOR-CON) 10 MEQ tablet Take 1 tablet (10 mEq total) by mouth daily. 90 tablet 3  . pregabalin (LYRICA) 100 MG capsule Take 1 capsule (100 mg total) by mouth 2 (two) times daily. 180 capsule 3  . rOPINIRole (REQUIP) 3 MG tablet Take 3 mg by mouth at bedtime.    . rosuvastatin (CRESTOR) 20 MG tablet Take 1 tablet (20 mg total) by mouth daily. 90 tablet 3  .  sacubitril-valsartan (ENTRESTO) 49-51 MG Take 1 tablet by mouth 2 (two) times daily. 180 tablet 3  . spironolactone (ALDACTONE) 25 MG tablet Take 0.5 tablets (12.5 mg total) by mouth daily. 45 tablet 3    No results found for this or any previous visit (from the past 48 hour(s)). No results found.  ROS: Pain with rom of the right upper extremity  Physical Exam: Alert and oriented 75 y.o. female in no acute distress Cranial nerves 2-12 intact Cervical spine: full rom with no tenderness, nv intact distally Chest: active breath sounds bilaterally, no wheeze rhonchi or rales Heart: regular rate and rhythm, no murmur Abd: non tender non distended with active  bowel sounds Hip is stable with rom  Right shoulder painful and weak rom nv intact distally No rashes or edema distally  Assessment/Plan Assessment: right shoulder cuff arthropathy  Plan:  Patient will undergo a right reverse shoulder by Dr. Veverly Fells at [place] benefits and expectations were discussed with the patient. Patient understand risks, benefits and expectations and wishes to proceed. Preoperative templating of the joint replacement has been completed, documented, and submitted to the Operating Room personnel in order to optimize intra-operative equipment management.   Merla Riches PA-C, MPAS [facility] is now [employer] [address] [place], [place], [place] [address] Phone: [phone] [email or web address]  [employer]

## 2020-12-04 ENCOUNTER — Other Ambulatory Visit: Payer: Self-pay

## 2020-12-04 ENCOUNTER — Encounter (INDEPENDENT_AMBULATORY_CARE_PROVIDER_SITE_OTHER): Payer: Self-pay | Admitting: Otolaryngology

## 2020-12-04 ENCOUNTER — Ambulatory Visit (INDEPENDENT_AMBULATORY_CARE_PROVIDER_SITE_OTHER): Payer: Medicare Other | Admitting: Otolaryngology

## 2020-12-04 VITALS — Temp 96.4°F

## 2020-12-04 DIAGNOSIS — R490 Dysphonia: Secondary | ICD-10-CM

## 2020-12-04 NOTE — Progress Notes (Signed)
HPI: [name] is a 75 y.o. female who presents is referred by Gwyndolyn Kaufman, MD for evaluation of hoarseness.  Apparently patient had a bad case of COVID in January where she was on a ventilator for 5 to 6 days at [facility].  Since she was discharged from the hospital she states that she has had chronic hoarseness and a weak voice.  She denies any trouble swallowing.  Denies any sore throat although occasionally when she wakes up in the morning she has a sore throat.  She does state that she has history of reflux problems and takes omeprazole 40 mg in the morning. She does not smoke.Marland Kitchen  Past Medical History:  Diagnosis Date  . Arthritis   . Chronic back pain    buldging disc,scoliosis,arthritis  . Congestive heart failure with left ventricular systolic dysfunction ([place]) 08/24/2020  . Diabetes mellitus without complication ([place])    takes Trulicity,Jardiance,and Metformin daily.Average fasting blood sugar runs around130  . GERD (gastroesophageal reflux disease)    takes Omeprazole daily  . History of bronchitis > 8 yrs ago  . History of shingles   . HTN (hypertension)    takes Amlodipine and Micardis daily  . Hx of colonic polyps    benign  . Internal and external hemorrhoids without complication   . Joint pain   . Mitral valve prolapse   . Nocturia   . PONV (postoperative nausea and vomiting)    when gets injections in joints gets hives.Betadine rash  . Restless leg syndrome    takes Requip at bedtime  . Seasonal allergies    takes Claritin daily as needed  . Urinary frequency   . Uterine fibroid    Past Surgical History:  Procedure Laterality Date  . BUNIONECTOMY Bilateral   . COLONOSCOPY  07/12/2004   diverticulosis, internal and external hemorrhoids  . COLONOSCOPY    . FINGER ARTHROSCOPY WITH CARPOMETACARPEL [facility]) ARTHROPLASTY Right 09/28/2015   Procedure: RIGHT THUMB TRAPEZIUM EXCISION WITH CARPOMETACARPEL ([place]) ARTHROPLASTY AND TENDON TRANSFER;  Surgeon:  Iran Planas, MD;  Location: [place];  Service: Orthopedics;  Laterality: Right;  . HEMORRHOID SURGERY     almost 40 yrs ago  . HYSTEROSCOPY WITH D & C  08/23/2000   and resectoscopic myomectomy  . LUMBAR DISC SURGERY  03/16/2005  . LUMBAR EPIDURAL INJECTION    . PLANTAR FASCIA RELEASE Left 02/03/2010   and torn tendon  . RIGHT/LEFT HEART CATH AND CORONARY ANGIOGRAPHY N/A 09/07/2020   Procedure: RIGHT/LEFT HEART CATH AND CORONARY ANGIOGRAPHY;  Surgeon: Nelva Bush, MD;  Location: [facility];  Service: Cardiovascular;  Laterality: N/A;  . TENDON TRANSFER Right 09/28/2015   Procedure: TENDON TRANSFER;  Surgeon: Iran Planas, MD;  Location: [place];  Service: Orthopedics;  Laterality: Right;  . TONSILLECTOMY    . TUBAL LIGATION    . WRIST SURGERY     left, removal of cyst   Social History   Socioeconomic History  . Marital status: Married    Spouse name: Not on file  . Number of children: 3  . Years of education: Not on file  . Highest education level: Not on file  Occupational History  . Occupation: Retired   Tobacco Use  . Smoking status: Never Smoker  . Smokeless tobacco: Never Used  Substance and Sexual Activity  . Alcohol use: No    Alcohol/week: 0.0 standard drinks  . Drug use: No  . Sexual activity: Yes  Other Topics Concern  . Not on  file  Social History Narrative   1 caffeine drinks daily         Social Determinants of Health   Financial Resource Strain: Not on file  Food Insecurity: Not on file  Transportation Needs: Not on file  Physical Activity: Not on file  Stress: Not on file  Social Connections: Not on file   Family History  Problem Relation Age of Onset  . Pancreatic cancer Father   . Colon cancer Neg Hx   . Breast cancer Neg Hx    Allergies  Allergen Reactions  . Amlodipine Swelling  . Depo-Medrol [Methylprednisolone] Hives  . Gabapentin Rash    hives   . Levothyroxine Other (See Comments)    severe muscle pain in neck    .  Sitagliptin Other (See Comments)    Urinary hesitancy Januvia  . Ace Inhibitors     cough  . Betadine [Povidone Iodine]     rash  . Codeine Nausea And Vomiting  . Cortizone-10 [Hydrocortisone] Other (See Comments)    unknown  . Tetracyclines & Related Nausea And Vomiting  . Dexamethasone Rash   Prior to Admission medications   Medication Sig Start Date End Date Taking? Authorizing Provider  allopurinol (ZYLOPRIM) 100 MG tablet Take 100 mg by mouth daily. 07/06/20   [provider]  carvedilol (COREG) 3.125 MG tablet Take 1 tablet (3.125 mg total) by mouth 2 (two) times daily. 09/21/20 12/20/20  Freada Bergeron, MD  cholecalciferol (VITAMIN D) 1000 UNITS tablet Take 1,000 Units by mouth daily.    [provider]  cycloSPORINE (RESTASIS) 0.05 % ophthalmic emulsion Place 1 drop into both eyes 2 (two) times daily.    [provider]  dapagliflozin propanediol (FARXIGA) 10 MG TABS tablet Take 1 tablet (10 mg total) by mouth daily before breakfast. 09/21/20   Freada Bergeron, MD  Dulaglutide 0.75 MG/0.5ML SOPN Inject 0.75 mg into the skin once a week.    [provider]  fluticasone (FLONASE) 50 MCG/ACT nasal spray Place 1 spray into both nostrils daily as needed for allergies. 01/11/20   [provider]  furosemide (LASIX) 20 MG tablet Take one tablet (20mg ) on Monday, Wednesday and Friday and Sat. 11/11/20   Freada Bergeron, MD  Magnesium Oxide 400 MG CAPS Take 1 capsule (400 mg total) by mouth daily. 10/16/20   Freada Bergeron, MD  metFORMIN (GLUCOPHAGE XR) 750 MG 24 hr tablet Take 2 tablets (1,500 mg total) by mouth daily with breakfast. 10/06/20   Leamon Arnt, MD  montelukast (SINGULAIR) 10 MG tablet Take 10 mg by mouth at bedtime.    [provider]  Multiple Vitamin (MULTIVITAMIN WITH MINERALS) TABS tablet Take 1 tablet by mouth daily.    [provider]  NUCYNTA 50 MG tablet Take 50 mg by mouth at bedtime.  09/23/20   [provider]  omeprazole (PRILOSEC) 40 MG capsule Take 40 mg by mouth daily.    [provider]  potassium chloride (KLOR-CON) 10 MEQ tablet Take 1 tablet (10 mEq total) by mouth daily. 09/21/20 12/20/20  Freada Bergeron, MD  pregabalin (LYRICA) 100 MG capsule Take 1 capsule (100 mg total) by mouth 2 (two) times daily. 10/27/20   Leamon Arnt, MD  rOPINIRole (REQUIP) 3 MG tablet Take 3 mg by mouth at bedtime. 06/26/15   [provider]  rosuvastatin (CRESTOR) 20 MG tablet Take 1 tablet (20 mg total) by mouth daily. 08/31/20 08/26/21  Freada Bergeron,  MD  sacubitril-valsartan (ENTRESTO) 49-51 MG Take 1 tablet by mouth 2 (two) times daily. 10/20/20 10/15/21  Freada Bergeron, MD  spironolactone (ALDACTONE) 25 MG tablet Take 0.5 tablets (12.5 mg total) by mouth daily. 10/20/20 10/15/21  Freada Bergeron, MD     Positive ROS: Otherwise negative  All other systems have been reviewed and were otherwise negative with the exception of those mentioned in the HPI and as above.  Physical Exam: Constitutional: Alert, well-appearing, no acute distress.  She has a reasonable voice in the office perhaps slightly weak voice but she is easily understandable. Ears: External ears without lesions or tenderness. Ear canals are clear bilaterally with intact, clear TMs.  Nasal: External nose without lesions. Septum slightly deviated to the right with mild rhinitis.  Both middle meatus regions were clear with no gross mucopurulent discharge noted.. Oral: Lips and gums without lesions. Tongue and palate mucosa without lesions. Posterior oropharynx clear.  Patient is status post tonsillectomy.  Indirect laryngoscopy revealed a clear base of tongue vallecula and epiglottis. Fiberoptic laryngoscopy was performed to the left nostril.  The middle meatus region was clear.  The nasopharynx was clear.  The base of tongue vallecula and epiglottis were normal.  On evaluation the  vocal cords she has slight diminished mobility of the left true vocal cord compared to the right.  No vocal cord abnormality noted on either side.  Mild arytenoid edema consistent with reflux type symptoms but no mucosal abnormalities noted.  Piriform sinuses were clear. Neck: No palpable adenopathy or masses.  No palpable thyroid nodules noted.  No supraclavicular adenopathy noted. Respiratory: Breathing comfortably  Skin: No facial/neck lesions or rash noted.  Laryngoscopy  Date/Time: 12/04/2020 3:08 PM Performed by: Rozetta Nunnery, MD Authorized by: Rozetta Nunnery, MD   Consent:    Consent obtained:  Verbal   Consent given by:  Patient Procedure details:    Indications: hoarseness, dysphagia, or aspiration     Medication:  Afrin   Instrument: flexible fiberoptic laryngoscope     Scope location: left nare   Sinus:    Left middle meatus: normal     Right nasopharynx: normal   Mouth:    Vallecula: normal     Base of tongue: normal     Epiglottis: normal   Throat:    Pyriform sinus: normal     True vocal cords: normal   Comments:     On fiberoptic laryngoscopy she has perhaps diminished mobility of the left true vocal cord compared to the right but this is minimal.  There are no vocal cord lesions noted.  She did have mild edema of the arytenoid mucosa was consistent with laryngeal pharyngeal reflux.    Assessment: Hoarseness related to slight left vocal cord weakness compared to the right. Patient also has history of reflux symptoms which apparently are worse at night.  Plan: Discussed with her concerning changing her omeprazole dosing from a.m. to p.m. before dinner as this will provide better nighttime coverage of her reflux. I will have her follow-up here in 2 months for recheck of the vocal cords to see if she obtains better mobility of the left true vocal cord.  This may have been related to intubation.  There are no vocal cord lesions noted on fiberoptic  laryngoscopy and I reviewed this with her. She will follow-up here in 2 months for recheck.   Radene Journey, MD   CC:

## 2020-12-10 ENCOUNTER — Other Ambulatory Visit (HOSPITAL_COMMUNITY)
Admission: RE | Admit: 2020-12-10 | Discharge: 2020-12-10 | Disposition: A | Discharge status: Home / Self Care | Payer: Medicare Other | Source: Ambulatory Visit | Attending: Orthopedic Surgery | Admitting: Orthopedic Surgery

## 2020-12-10 DIAGNOSIS — Z20822 Contact with and (suspected) exposure to covid-19: Secondary | ICD-10-CM | POA: Insufficient documentation

## 2020-12-10 DIAGNOSIS — Z01812 Encounter for preprocedural laboratory examination: Secondary | ICD-10-CM | POA: Diagnosis present

## 2020-12-10 LAB — SARS CORONAVIRUS 2 (TAT 6-24 HRS): SARS Coronavirus 2: NEGATIVE

## 2020-12-10 NOTE — Patient Instructions (Signed)
DUE TO COVID-19 ONLY ONE VISITOR IS ALLOWED TO COME WITH YOU AND STAY IN THE WAITING ROOM ONLY DURING PRE OP AND PROCEDURE DAY OF SURGERY. THE 1 VISITOR  MAY VISIT WITH YOU AFTER SURGERY IN YOUR PRIVATE ROOM DURING VISITING HOURS ONLY!  YOU NEED TO HAVE A COVID 19 TEST ON: 12/10/20, THIS TEST MUST BE DONE BEFORE SURGERY,  COVID TESTING SITE [address] [place] [address], IT IS ON THE RIGHT GOING OUT [address] APPROXIMATELY  2 MINUTES PAST [employer] ON THE RIGHT. ONCE YOUR COVID TEST IS COMPLETED,  PLEASE BEGIN THE QUARANTINE INSTRUCTIONS AS OUTLINED IN YOUR HANDOUT.                KARLISA GAUBERT    Your procedure is scheduled on: 12/14/20   Report to [facility] Main  Entrance   Report to short stay at: 5:15 AM    Call this number if you have problems the morning of surgery [phone]    Remember:   NO SOLID FOOD AFTER MIDNIGHT THE NIGHT PRIOR TO SURGERY. NOTHING BY MOUTH EXCEPT CLEAR LIQUIDS UNTIL: 4:30 AM . PLEASE FINISH GATORADE DRINK PER SURGEON ORDER  WHICH NEEDS TO BE COMPLETED AT: 4:30 AM .  CLEAR LIQUID DIET  Foods Allowed                                                                     Foods Excluded  Coffee and tea, regular and decaf                             liquids that you cannot  Plain Jell-O any favor except red or purple                                           see through such as: Fruit ices (not with fruit pulp)                                     milk, soups, orange juice  Iced Popsicles                                    All solid food Carbonated beverages, regular and diet                                    Cranberry, grape and apple juices Sports drinks like Gatorade Lightly seasoned clear broth or consume(fat free) Sugar, honey syrup  Sample Menu Breakfast                                Lunch  Supper Cranberry juice                    Beef broth                            Chicken  broth Jell-O                                     Grape juice                           Apple juice Coffee or tea                        Jell-O                                      Popsicle                                                Coffee or tea                        Coffee or tea  ___________________________________________________________________   BRUSH YOUR TEETH MORNING OF SURGERY AND RINSE YOUR MOUTH OUT, NO CHEWING GUM CANDY OR MINTS.    Take these medicines the morning of surgery with A SIP OF WATER: pregabalin,carvedilol,allopurinol,omeprazole. Use flonase and eye drops as usual.  How to Manage Your Diabetes Before and After Surgery  Why is it important to control my blood sugar before and after surgery? . Improving blood sugar levels before and after surgery helps healing and can limit problems. . A way of improving blood sugar control is eating a healthy diet by: o  Eating less sugar and carbohydrates o  Increasing activity/exercise o  Talking with your doctor about reaching your blood sugar goals . High blood sugars (greater than 180 mg/dL) can raise your risk of infections and slow your recovery, so you will need to focus on controlling your diabetes during the weeks before surgery. . Make sure that the doctor who takes care of your diabetes knows about your planned surgery including the date and location.  How do I manage my blood sugar before surgery? . Check your blood sugar at least 4 times a day, starting 2 days before surgery, to make sure that the level is not too high or low. o Check your blood sugar the morning of your surgery when you wake up and every 2 hours until you get to the Short Stay unit. . If your blood sugar is less than 70 mg/dL, you will need to treat for low blood sugar: o Do not take insulin. o Treat a low blood sugar (less than 70 mg/dL) with  cup of clear juice (cranberry or apple), 4 glucose tablets, OR glucose gel. o Recheck blood sugar in  15 minutes after treatment (to make sure it is greater than 70 mg/dL). If your blood sugar is not greater than 70 mg/dL on recheck, call [phone] for further instructions. . Report your blood sugar to the short stay nurse when you get to  Short Stay.  . If you are admitted to the hospital after surgery: o Your blood sugar will be checked by the staff and you will probably be given insulin after surgery (instead of oral diabetes medicines) to make sure you have good blood sugar levels. o The goal for blood sugar control after surgery is 80-180 mg/dL.   WHAT DO I DO ABOUT MY DIABETES MEDICATION?  Marland Kitchen Do not take oral diabetes medicines (pills) the morning of surgery.  . THE DAY BEFORE SURGERY, take Metformin as usual. DO NOT take farxiga.      THE MORNING OF SURGERY, DO NOT TAKE ANY DIABETIC MEDICATIONS DAY OF YOUR SURGERY                               You may not have any metal on your body including hair pins and              piercings  Do not wear jewelry, make-up, lotions, powders or perfumes, deodorant             Do not wear nail polish on your fingernails.  Do not shave  48 hours prior to surgery.    Do not bring valuables to the hospital. [place].  Contacts, dentures or bridgework may not be worn into surgery.  Leave suitcase in the car. After surgery it may be brought to your room.     Patients discharged the day of surgery will not be allowed to drive home. IF YOU ARE HAVING SURGERY AND GOING HOME THE SAME DAY, YOU MUST HAVE AN ADULT TO DRIVE YOU HOME AND BE WITH YOU FOR 24 HOURS. YOU MAY GO HOME BY TAXI OR UBER OR ORTHERWISE, BUT AN ADULT MUST ACCOMPANY YOU HOME AND STAY WITH YOU FOR 24 HOURS.  Name and phone number of your driver:  Special Instructions: N/A              Please read over the following fact sheets you were given: _____________________________________________________________________        [facility] -  Preparing for Surgery Before surgery, you can play an important role.  Because skin is not sterile, your skin needs to be as free of germs as possible.  You can reduce the number of germs on your skin by washing with CHG (chlorahexidine gluconate) soap before surgery.  CHG is an antiseptic cleaner which kills germs and bonds with the skin to continue killing germs even after washing. Please DO NOT use if you have an allergy to CHG or antibacterial soaps.  If your skin becomes reddened/irritated stop using the CHG and inform your nurse when you arrive at Short Stay. Do not shave (including legs and underarms) for at least 48 hours prior to the first CHG shower.  You may shave your face/neck. Please follow these instructions carefully:  1.  Shower with CHG Soap the night before surgery and the  morning of Surgery.  2.  If you choose to wash your hair, wash your hair first as usual with your  normal  shampoo.  3.  After you shampoo, rinse your hair and body thoroughly to remove the  shampoo.                           4.  Use CHG as you would any other liquid soap.  You can apply chg directly  to the skin and wash                       Gently with a scrungie or clean washcloth.  5.  Apply the CHG Soap to your body ONLY FROM THE NECK DOWN.   Do not use on face/ open                           Wound or open sores. Avoid contact with eyes, ears mouth and genitals (private parts).                       Wash face,  Genitals (private parts) with your normal soap.             6.  Wash thoroughly, paying special attention to the area where your surgery  will be performed.  7.  Thoroughly rinse your body with warm water from the neck down.  8.  DO NOT shower/wash with your normal soap after using and rinsing off  the CHG Soap.                9.  Pat yourself dry with a clean towel.            10.  Wear clean pajamas.            11.  Place clean sheets on your bed the night of your first shower and do not  sleep  with pets. Day of Surgery : Do not apply any lotions/deodorants the morning of surgery.  Please wear clean clothes to the hospital/surgery center.  FAILURE TO FOLLOW THESE INSTRUCTIONS MAY RESULT IN THE CANCELLATION OF YOUR SURGERY PATIENT SIGNATURE_________________________________  NURSE SIGNATURE__________________________________  ________________________________________________________________________  [facility]- Preparing for Total Shoulder Arthroplasty    Before surgery, you can play an important role. Because skin is not sterile, your skin needs to be as free of germs as possible. You can reduce the number of germs on your skin by using the following products. . Benzoyl Peroxide Gel o Reduces the number of germs present on the skin o Applied twice a day to shoulder area starting two days before surgery    ==================================================================  Please follow these instructions carefully:  BENZOYL PEROXIDE 5% GEL  Please do not use if you have an allergy to benzoyl peroxide.   If your skin becomes reddened/irritated stop using the benzoyl peroxide.  Starting two days before surgery, apply as follows: 1. Apply benzoyl peroxide in the morning and at night. Apply after taking a shower. If you are not taking a shower clean entire shoulder front, back, and side along with the armpit with a clean wet washcloth.  2. Place a quarter-sized dollop on your shoulder and rub in thoroughly, making sure to cover the front, back, and side of your shoulder, along with the armpit.   2 days before ____ AM   ____ PM              1 day before ____ AM   ____ PM                         3. Do this twice a day for two days.  (Last application is the night before surgery, AFTER using the CHG soap as described below).  4.  Do NOT apply benzoyl peroxide gel on the day of surgery.   Incentive Spirometer  An incentive spirometer is a tool that can help keep your lungs  clear and active. This tool measures how well you are filling your lungs with each breath. Taking long deep breaths may help reverse or decrease the chance of developing breathing (pulmonary) problems (especially infection) following:  A long period of time when you are unable to move or be active. BEFORE THE PROCEDURE   If the spirometer includes an indicator to show your best effort, your nurse or respiratory therapist will set it to a desired goal.  If possible, sit up straight or lean slightly forward. Try not to slouch.  Hold the incentive spirometer in an upright position. INSTRUCTIONS FOR USE  1. Sit on the edge of your bed if possible, or sit up as far as you can in bed or on a chair. 2. Hold the incentive spirometer in an upright position. 3. Breathe out normally. 4. Place the mouthpiece in your mouth and seal your lips tightly around it. 5. Breathe in slowly and as deeply as possible, raising the piston or the ball toward the top of the column. 6. Hold your breath for 3-5 seconds or for as long as possible. Allow the piston or ball to fall to the bottom of the column. 7. Remove the mouthpiece from your mouth and breathe out normally. 8. Rest for a few seconds and repeat Steps 1 through 7 at least 10 times every 1-2 hours when you are awake. Take your time and take a few normal breaths between deep breaths. 9. The spirometer may include an indicator to show your best effort. Use the indicator as a goal to work toward during each repetition. 10. After each set of 10 deep breaths, practice coughing to be sure your lungs are clear. If you have an incision (the cut made at the time of surgery), support your incision when coughing by placing a pillow or rolled up towels firmly against it. Once you are able to get out of bed, walk around indoors and cough well. You may stop using the incentive spirometer when instructed by your caregiver.  RISKS AND COMPLICATIONS  Take your time so you do  not get dizzy or light-headed.  If you are in pain, you may need to take or ask for pain medication before doing incentive spirometry. It is harder to take a deep breath if you are having pain. AFTER USE  Rest and breathe slowly and easily.  It can be helpful to keep track of a log of your progress. Your caregiver can provide you with a simple table to help with this. If you are using the spirometer at home, follow these instructions: SEEK MEDICAL CARE IF:   You are having difficultly using the spirometer.  You have trouble using the spirometer as often as instructed.  Your pain medication is not giving enough relief while using the spirometer.  You develop fever of 100.5 F (38.1 C) or higher. SEEK IMMEDIATE MEDICAL CARE IF:   You cough up bloody sputum that had not been present before.  You develop fever of 102 F (38.9 C) or greater.  You develop worsening pain at or near the incision site. MAKE SURE YOU:   Understand these instructions.  Will watch your condition.  Will get help right away if you are not doing well or get worse. Document Released: 11/28/2006 Document Revised: 10/10/2011 Document Reviewed: 01/29/2007 [employer] Patient Information  2014 [employer].   ________________________________________________________________________

## 2020-12-11 ENCOUNTER — Encounter (HOSPITAL_COMMUNITY): Payer: Self-pay

## 2020-12-11 ENCOUNTER — Other Ambulatory Visit: Payer: Self-pay

## 2020-12-11 ENCOUNTER — Encounter (HOSPITAL_COMMUNITY)
Admission: RE | Admit: 2020-12-11 | Discharge: 2020-12-11 | Disposition: A | Discharge status: Home / Self Care | Payer: Medicare Other | Source: Ambulatory Visit | Attending: Orthopedic Surgery | Admitting: Orthopedic Surgery

## 2020-12-11 DIAGNOSIS — Z79899 Other long term (current) drug therapy: Secondary | ICD-10-CM | POA: Insufficient documentation

## 2020-12-11 DIAGNOSIS — E1122 Type 2 diabetes mellitus with diabetic chronic kidney disease: Secondary | ICD-10-CM | POA: Diagnosis not present

## 2020-12-11 DIAGNOSIS — N183 Chronic kidney disease, stage 3 unspecified: Secondary | ICD-10-CM | POA: Diagnosis not present

## 2020-12-11 DIAGNOSIS — M19011 Primary osteoarthritis, right shoulder: Secondary | ICD-10-CM | POA: Diagnosis not present

## 2020-12-11 DIAGNOSIS — I130 Hypertensive heart and chronic kidney disease with heart failure and stage 1 through stage 4 chronic kidney disease, or unspecified chronic kidney disease: Secondary | ICD-10-CM | POA: Diagnosis not present

## 2020-12-11 DIAGNOSIS — Z8616 Personal history of COVID-19: Secondary | ICD-10-CM | POA: Insufficient documentation

## 2020-12-11 DIAGNOSIS — I5022 Chronic systolic (congestive) heart failure: Secondary | ICD-10-CM | POA: Diagnosis not present

## 2020-12-11 DIAGNOSIS — Z7982 Long term (current) use of aspirin: Secondary | ICD-10-CM | POA: Diagnosis not present

## 2020-12-11 DIAGNOSIS — Z01812 Encounter for preprocedural laboratory examination: Secondary | ICD-10-CM | POA: Diagnosis present

## 2020-12-11 DIAGNOSIS — Z7901 Long term (current) use of anticoagulants: Secondary | ICD-10-CM | POA: Diagnosis not present

## 2020-12-11 DIAGNOSIS — Z7984 Long term (current) use of oral hypoglycemic drugs: Secondary | ICD-10-CM | POA: Diagnosis not present

## 2020-12-11 LAB — BASIC METABOLIC PANEL
Anion gap: 10 (ref 5–15)
BUN: 25 mg/dL — ABNORMAL HIGH (ref 8–23)
CO2: 24 mmol/L (ref 22–32)
Calcium: 9.9 mg/dL (ref 8.9–10.3)
Chloride: 103 mmol/L (ref 98–111)
Creatinine, Ser: 1.13 mg/dL — ABNORMAL HIGH (ref 0.44–1.00)
GFR, Estimated: 51 mL/min — ABNORMAL LOW (ref >60)
Glucose, Bld: 153 mg/dL — ABNORMAL HIGH (ref 70–99)
Potassium: 4.2 mmol/L (ref 3.5–5.1)
Sodium: 137 mmol/L (ref 135–145)

## 2020-12-11 LAB — HEMOGLOBIN A1C
Hgb A1c MFr Bld: 9.1 % — ABNORMAL HIGH (ref 4.8–5.6)
Mean Plasma Glucose: 214.47 mg/dL

## 2020-12-11 LAB — GLUCOSE, CAPILLARY: Glucose-Capillary: 148 mg/dL — ABNORMAL HIGH (ref 70–99)

## 2020-12-11 LAB — SURGICAL PCR SCREEN
MRSA, PCR: NEGATIVE
Staphylococcus aureus: NEGATIVE

## 2020-12-11 LAB — CBC
HCT: 41.3 % (ref 36.0–46.0)
Hemoglobin: 13.5 g/dL (ref 12.0–15.0)
MCH: 29.5 pg (ref 26.0–34.0)
MCHC: 32.7 g/dL (ref 30.0–36.0)
MCV: 90.2 fL (ref 80.0–100.0)
Platelets: 255 10*3/uL (ref 150–400)
RBC: 4.58 MIL/uL (ref 3.87–5.11)
RDW: 13.5 % (ref 11.5–15.5)
WBC: 6.8 10*3/uL (ref 4.0–10.5)
nRBC: 0 % (ref 0.0–0.2)

## 2020-12-11 NOTE — Progress Notes (Signed)
Anesthesia Chart Review   Case: [id number] Date/Time: 12/14/20 0700   Procedure: REVERSE SHOULDER ARTHROPLASTY (Right Shoulder) - with interscalene block   Anesthesia type: General   Pre-op diagnosis: Righ shoulder osteoarthritis   Location: [facility] ROOM 08 / [facility]   Surgeons: Netta Cedars, MD      DISCUSSION:75 y.o. never smoker with h/o PONV, GERD, HTN, DM II, CHF, CKD Stage III, right shoulder OA scheduled for above procedure 12/14/2020 with Dr. Netta Cedars.   Pt admitted for COVID in January, on ventilater for 5-6 days.  She has experienced hoarseness and weak voice since that time.  Denies trouble swallowing.  She was seen by ENT 12/04/2020 for evaluation.  Per OV note, "hoarseness related to slight left vocal cord weakness compared to the right."  Pt last seen by cardiology 11/11/2020. CHF diagnosed in setting of COVID PNA. Initial TTE with LVEF 20-25%. Now 30-35%.  Per Dr. Johney Frame in regards to shoulder surgery, "Patient now medically optimized from CV standpoint; okay to proceed." VS: BP 134/68   Pulse 71   Temp 36.8 C (Oral)   SpO2 100%   PROVIDERS: Leamon Arnt, MD is PCP last seen 12/02/2020  Gwyndolyn Kaufman, MD is Cardiologist  LABS: Labs reviewed: Acceptable for surgery. (all labs ordered are listed, but only abnormal results are displayed)  Labs Reviewed  BASIC METABOLIC PANEL - Abnormal; Notable for the following components:      Result Value   Glucose, Bld 153 (*)    BUN 25 (*)    Creatinine, Ser 1.13 (*)    GFR, Estimated 51 (*)    All other components within normal limits  GLUCOSE, CAPILLARY - Abnormal; Notable for the following components:   Glucose-Capillary 148 (*)    All other components within normal limits  SURGICAL PCR SCREEN  CBC  HEMOGLOBIN A1C     IMAGES:   EKG: 08/15/2020 Rate 80 bpm  Sinus rhythm with occasional Premature ventricular complexes Nonspecific T wave abnormality Abnormal ECG Since last tracing rate slower  CV: Echo  10/15/2020 1. Left ventricular ejection fraction, by estimation, is 30 to 35%. The  left ventricle has moderately decreased function. The left ventricle  demonstrates global hypokinesis. Left ventricular diastolic parameters are  consistent with Grade II diastolic  dysfunction (pseudonormalization). Elevated left atrial pressure. The E/e'  is 22.0.  2. Right ventricular systolic function is normal. The right ventricular  size is normal. Tricuspid regurgitation signal is inadequate for assessing  PA pressure.  3. Left atrial size was mildly dilated.  4. The mitral valve is grossly normal. Mild mitral valve regurgitation.  No evidence of mitral stenosis.  5. The aortic valve is tricuspid. Aortic valve regurgitation is not  visualized. No aortic stenosis is present.  6. The inferior vena cava is normal in size with greater than 50%  respiratory variability, suggesting right atrial pressure of 3 mmHg.  Cardiac Cath 09/07/2020 Conclusions: 1. Mild plaquing of the mid LAD with 10-20% stenosis.  Otherwise, no angiographically significant coronary artery disease. 2. Normal left and right heart filling pressures. 3. Borderline elevated pulmonary artery pressure. 4. Normal Fick cardiac output/index.  Recommendations: 1. Maintain net even fluid balance and optimize goal directed medical therapy for non-ischemic cardiomyopathy. 2. Primary prevention of coronary artery disease.   Past Medical History:  Diagnosis Date  . Arthritis   . Chronic back pain    buldging disc,scoliosis,arthritis  . Congestive heart failure with left ventricular systolic dysfunction ([place]) 08/24/2020  . Diabetes mellitus without  complication ([place])    takes Trulicity,Jardiance,and Metformin daily.Average fasting blood sugar runs around130  . GERD (gastroesophageal reflux disease)    takes Omeprazole daily  . History of bronchitis > 8 yrs ago  . History of shingles   . HTN (hypertension)    takes Amlodipine and  Micardis daily  . Hx of colonic polyps    benign  . Internal and external hemorrhoids without complication   . Joint pain   . Mitral valve prolapse   . Nocturia   . PONV (postoperative nausea and vomiting)    when gets injections in joints gets hives.Betadine rash  . Restless leg syndrome    takes Requip at bedtime  . Seasonal allergies    takes Claritin daily as needed  . Urinary frequency   . Uterine fibroid     Past Surgical History:  Procedure Laterality Date  . BUNIONECTOMY Bilateral   . COLONOSCOPY  07/12/2004   diverticulosis, internal and external hemorrhoids  . COLONOSCOPY    . FINGER ARTHROSCOPY WITH CARPOMETACARPEL [facility]) ARTHROPLASTY Right 09/28/2015   Procedure: RIGHT THUMB TRAPEZIUM EXCISION WITH CARPOMETACARPEL ([place]) ARTHROPLASTY AND TENDON TRANSFER;  Surgeon: Iran Planas, MD;  Location: [place];  Service: Orthopedics;  Laterality: Right;  . HEMORRHOID SURGERY     almost 40 yrs ago  . HYSTEROSCOPY WITH D & C  08/23/2000   and resectoscopic myomectomy  . LUMBAR DISC SURGERY  03/16/2005  . LUMBAR EPIDURAL INJECTION    . PLANTAR FASCIA RELEASE Left 02/03/2010   and torn tendon  . RIGHT/LEFT HEART CATH AND CORONARY ANGIOGRAPHY N/A 09/07/2020   Procedure: RIGHT/LEFT HEART CATH AND CORONARY ANGIOGRAPHY;  Surgeon: Nelva Bush, MD;  Location: [place] CV LAB;  Service: Cardiovascular;  Laterality: N/A;  . TENDON TRANSFER Right 09/28/2015   Procedure: TENDON TRANSFER;  Surgeon: Iran Planas, MD;  Location: [place];  Service: Orthopedics;  Laterality: Right;  . TONSILLECTOMY    . TUBAL LIGATION    . WRIST SURGERY     left, removal of cyst    MEDICATIONS: . allopurinol (ZYLOPRIM) 100 MG tablet  . aspirin EC 81 MG tablet  . carvedilol (COREG) 3.125 MG tablet  . cholecalciferol (VITAMIN D) 1000 UNITS tablet  . cycloSPORINE (RESTASIS) 0.05 % ophthalmic emulsion  . dapagliflozin propanediol (FARXIGA) 10 MG TABS tablet  . Dulaglutide 0.75 MG/0.5ML SOPN  . fluticasone  (FLONASE) 50 MCG/ACT nasal spray  . furosemide (LASIX) 20 MG tablet  . Magnesium Oxide 400 MG CAPS  . metFORMIN (GLUCOPHAGE XR) 750 MG 24 hr tablet  . montelukast (SINGULAIR) 10 MG tablet  . Multiple Vitamin (MULTIVITAMIN WITH MINERALS) TABS tablet  . NUCYNTA 50 MG tablet  . omeprazole (PRILOSEC) 40 MG capsule  . potassium chloride (KLOR-CON) 10 MEQ tablet  . pregabalin (LYRICA) 100 MG capsule  . rOPINIRole (REQUIP) 3 MG tablet  . rosuvastatin (CRESTOR) 20 MG tablet  . sacubitril-valsartan (ENTRESTO) 49-51 MG  . spironolactone (ALDACTONE) 25 MG tablet   No current facility-administered medications for this encounter.    Konrad Felix, PA-C [facility] [phone]

## 2020-12-11 NOTE — Progress Notes (Signed)
COVID Vaccine Completed: NO Date COVID Vaccine completed: COVID vaccine manufacturer: [place]   PCP - Dr. Billey Chang. LOV: 12/02/20 Cardiologist - Dr. Gwyndolyn Kaufman. LOV: 11/11/20  Chest x-ray - 08/16/20 EKG - 08/15/20 Stress Test -  ECHO - 11/11/20 Cardiac Cath - 09/07/20 Pacemaker/ICD device last checked:  Sleep Study -  CPAP -   Fasting Blood Sugar - 160's-180's Checks Blood Sugar _____ times a day  Blood Thinner Instructions: Aspirin Instructions: Last Dose:  Anesthesia review: Hx: DIA,HTN,CHF,mitral valve prolapse.  Patient denies shortness of breath, fever, cough and chest pain at PAT appointment   Patient verbalized understanding of instructions that were given to them at the PAT appointment. Patient was also instructed that they will need to review over the PAT instructions again at home before surgery.

## 2020-12-11 NOTE — Anesthesia Preprocedure Evaluation (Addendum)
Anesthesia Evaluation  Patient identified by MRN, date of birth, ID band Patient awake    Reviewed: Allergy & Precautions, NPO status , Patient's Chart, lab work & pertinent test results, reviewed documented beta blocker date and time   History of Anesthesia Complications (+) PONV  Airway Mallampati: II  TM Distance: >3 FB Neck ROM: Full    Dental no notable dental hx. (+) Teeth Intact, Dental Advisory Given   Pulmonary neg pulmonary ROS,  admitted for COVID in January, on ventilater for 5-6 days.  She has experienced hoarseness and weak voice since that time.  Denies trouble swallowing. Per ENT note, "hoarseness related to slight left vocal cord weakness compared to the right"   Pulmonary exam normal breath sounds clear to auscultation       Cardiovascular hypertension, Pt. on medications and Pt. on home beta blockers +CHF  Normal cardiovascular exam Rhythm:Regular Rate:Normal  EKG: 08/15/2020 Rate 80 bpm  Sinus rhythm with occasional Premature ventricular complexes Nonspecific T wave abnormality Abnormal ECG Since last tracing rate slower  CV: Echo 10/15/2020 1. Left ventricular ejection fraction, by estimation, is 30 to 35%. The left ventricle has moderately decreased function. The left ventricle demonstrates global hypokinesis. Left ventricular diastolic parameters are  consistent with Grade II diastolic  dysfunction (pseudonormalization). Elevated left atrial pressure. The E/e' is 22.0.  2. Right ventricular systolic function is normal. The right ventricular size is normal. Tricuspid regurgitation signal is inadequate for assessing PA pressure.  3. Left atrial size was mildly dilated.  4. The mitral valve is grossly normal. Mild mitral valve regurgitation. No evidence of mitral stenosis.  5. The aortic valve is tricuspid. Aortic valve regurgitation is not visualized. No aortic stenosis is present.  6. The inferior  vena cava is normal in size with greater than 50% respiratory variability, suggesting right atrial pressure of 3 mmHg. Cardiac Cath 09/07/2020 Conclusions: 1.Mild plaquing of the mid LAD with 10-20% stenosis. Otherwise, no angiographically significant coronary artery disease. 2.Normal left and right heart filling pressures. 3.Borderline elevated pulmonary artery pressure. 4.Normal Fick cardiac output/index.  Recommendations: 1.Maintain net even fluid balance and optimize goal directed medical therapy for non-ischemic cardiomyopathy. 2.Primary prevention of coronary artery disease    Neuro/Psych negative neurological ROS  negative psych ROS   GI/Hepatic Neg liver ROS, GERD  Medicated and Controlled,  Endo/Other  negative endocrine ROSdiabetes, Oral Hypoglycemic Agents  Renal/GU Renal InsufficiencyRenal disease  negative genitourinary   Musculoskeletal negative musculoskeletal ROS (+)   Abdominal   Peds  Hematology negative hematology ROS (+)   Anesthesia Other Findings   Reproductive/Obstetrics                          Anesthesia Physical Anesthesia Plan  ASA: III  Anesthesia Plan: General and Regional   Post-op Pain Management:  Regional for Post-op pain   Induction: Intravenous  PONV Risk Score and Plan: 4 or greater and Midazolam, Dexamethasone, Ondansetron, Treatment may vary due to age or medical condition and Aprepitant  Airway Management Planned: Oral ETT  Additional Equipment:   Intra-op Plan:   Post-operative Plan: Extubation in OR  Informed Consent: I have reviewed the patients History and Physical, chart, labs and discussed the procedure including the risks, benefits and alternatives for the proposed anesthesia with the patient or authorized representative who has indicated his/her understanding and acceptance.     Dental advisory given  Plan Discussed with: CRNA  Anesthesia Plan Comments:  Anesthesia Quick  Evaluation

## 2020-12-14 ENCOUNTER — Observation Stay (HOSPITAL_COMMUNITY): Payer: Medicare Other

## 2020-12-14 ENCOUNTER — Ambulatory Visit (HOSPITAL_COMMUNITY): Payer: Medicare Other | Admitting: Anesthesiology

## 2020-12-14 ENCOUNTER — Encounter (HOSPITAL_COMMUNITY)
Admission: RE | Disposition: A | Discharge status: Home / Self Care | Payer: Self-pay | Source: Home / Self Care | Attending: Orthopedic Surgery

## 2020-12-14 ENCOUNTER — Encounter (HOSPITAL_COMMUNITY): Payer: Self-pay | Admitting: Orthopedic Surgery

## 2020-12-14 ENCOUNTER — Ambulatory Visit (HOSPITAL_COMMUNITY): Payer: Medicare Other | Admitting: Physician Assistant

## 2020-12-14 ENCOUNTER — Observation Stay (HOSPITAL_COMMUNITY)
Admission: RE | Admit: 2020-12-14 | Discharge: 2020-12-15 | Disposition: A | Discharge status: Home / Self Care | Payer: Medicare Other | Attending: Orthopedic Surgery | Admitting: Orthopedic Surgery

## 2020-12-14 ENCOUNTER — Other Ambulatory Visit: Payer: Self-pay

## 2020-12-14 DIAGNOSIS — Z79899 Other long term (current) drug therapy: Secondary | ICD-10-CM | POA: Diagnosis not present

## 2020-12-14 DIAGNOSIS — I110 Hypertensive heart disease with heart failure: Secondary | ICD-10-CM | POA: Diagnosis not present

## 2020-12-14 DIAGNOSIS — M19011 Primary osteoarthritis, right shoulder: Secondary | ICD-10-CM | POA: Diagnosis present

## 2020-12-14 DIAGNOSIS — Z7984 Long term (current) use of oral hypoglycemic drugs: Secondary | ICD-10-CM | POA: Diagnosis not present

## 2020-12-14 DIAGNOSIS — E119 Type 2 diabetes mellitus without complications: Secondary | ICD-10-CM | POA: Insufficient documentation

## 2020-12-14 DIAGNOSIS — M75121 Complete rotator cuff tear or rupture of right shoulder, not specified as traumatic: Secondary | ICD-10-CM | POA: Insufficient documentation

## 2020-12-14 DIAGNOSIS — Z96611 Presence of right artificial shoulder joint: Secondary | ICD-10-CM

## 2020-12-14 DIAGNOSIS — I509 Heart failure, unspecified: Secondary | ICD-10-CM | POA: Diagnosis not present

## 2020-12-14 HISTORY — PX: REVERSE SHOULDER ARTHROPLASTY: SHX5054

## 2020-12-14 LAB — GLUCOSE, CAPILLARY
Glucose-Capillary: 176 mg/dL — ABNORMAL HIGH (ref 70–99)
Glucose-Capillary: 143 mg/dL — ABNORMAL HIGH (ref 70–99)

## 2020-12-14 SURGERY — ARTHROPLASTY, SHOULDER, TOTAL, REVERSE
Anesthesia: Regional | Site: Shoulder | Laterality: Right

## 2020-12-14 MED ORDER — PREGABALIN 100 MG PO CAPS
100.0000 mg | ORAL_CAPSULE | Freq: Two times a day (BID) | ORAL | Status: DC
  Administered 2020-12-14 – 2020-12-15 (×2): 100 mg via ORAL
  Filled 2020-12-14 (×2): qty 1

## 2020-12-14 MED ORDER — PROPOFOL 10 MG/ML IV BOLUS
INTRAVENOUS | Status: AC
  Filled 2020-12-14: qty 20

## 2020-12-14 MED ORDER — ONDANSETRON HCL 4 MG/2ML IJ SOLN: 4.0000 mg | Freq: Four times a day (QID) | INTRAMUSCULAR | Status: DC | PRN

## 2020-12-14 MED ORDER — ONDANSETRON HCL 4 MG/2ML IJ SOLN
INTRAMUSCULAR | Status: AC
  Filled 2020-12-14: qty 2

## 2020-12-14 MED ORDER — BUPIVACAINE LIPOSOME 1.3 % IJ SUSP
INTRAMUSCULAR | Status: DC | PRN
  Administered 2020-12-14: 10 mL via PERINEURAL

## 2020-12-14 MED ORDER — FLUTICASONE PROPIONATE 50 MCG/ACT NA SUSP: 1.0000 | Freq: Every day | NASAL | Status: DC | PRN

## 2020-12-14 MED ORDER — CARVEDILOL 3.125 MG PO TABS
3.1250 mg | ORAL_TABLET | Freq: Two times a day (BID) | ORAL | Status: DC
  Administered 2020-12-14 – 2020-12-15 (×3): 3.125 mg via ORAL
  Filled 2020-12-14 (×2): qty 1

## 2020-12-14 MED ORDER — DULAGLUTIDE 0.75 MG/0.5ML ~~LOC~~ SOAJ: 0.7500 mg | SUBCUTANEOUS | Status: DC

## 2020-12-14 MED ORDER — BUPIVACAINE-EPINEPHRINE (PF) 0.25% -1:200000 IJ SOLN
INTRAMUSCULAR | Status: DC | PRN
  Administered 2020-12-14: 10 mL

## 2020-12-14 MED ORDER — ALLOPURINOL 100 MG PO TABS
100.0000 mg | ORAL_TABLET | Freq: Every day | ORAL | Status: DC
  Administered 2020-12-15: 100 mg via ORAL
  Filled 2020-12-14: qty 1

## 2020-12-14 MED ORDER — ACETAMINOPHEN 500 MG PO TABS
1000.0000 mg | ORAL_TABLET | Freq: Once | ORAL | Status: AC
  Administered 2020-12-14: 1000 mg via ORAL
  Filled 2020-12-14: qty 2

## 2020-12-14 MED ORDER — MENTHOL 3 MG MT LOZG: 1.0000 | LOZENGE | OROMUCOSAL | Status: DC | PRN

## 2020-12-14 MED ORDER — MAGNESIUM OXIDE -MG SUPPLEMENT 400 (240 MG) MG PO TABS
400.0000 mg | ORAL_TABLET | Freq: Every day | ORAL | Status: DC
  Administered 2020-12-14 – 2020-12-15 (×2): 400 mg via ORAL
  Filled 2020-12-14 (×2): qty 1

## 2020-12-14 MED ORDER — PHENYLEPHRINE HCL-NACL 10-0.9 MG/250ML-% IV SOLN
INTRAVENOUS | Status: DC | PRN
  Administered 2020-12-14: 50 ug/min via INTRAVENOUS

## 2020-12-14 MED ORDER — ADULT MULTIVITAMIN W/MINERALS CH
1.0000 | ORAL_TABLET | Freq: Every day | ORAL | Status: DC
  Administered 2020-12-14 – 2020-12-15 (×2): 1 via ORAL
  Filled 2020-12-14 (×2): qty 1

## 2020-12-14 MED ORDER — SODIUM CHLORIDE 0.9 % IR SOLN
Status: DC | PRN
  Administered 2020-12-14: 1000 mL

## 2020-12-14 MED ORDER — FENTANYL CITRATE (PF) 100 MCG/2ML IJ SOLN
25.0000 ug | INTRAMUSCULAR | Status: DC | PRN
  Administered 2020-12-14: 50 ug via INTRAVENOUS

## 2020-12-14 MED ORDER — SACUBITRIL-VALSARTAN 49-51 MG PO TABS
1.0000 | ORAL_TABLET | Freq: Two times a day (BID) | ORAL | Status: DC
  Administered 2020-12-14 – 2020-12-15 (×2): 1 via ORAL
  Filled 2020-12-14 (×2): qty 1

## 2020-12-14 MED ORDER — ORAL CARE MOUTH RINSE: 15.0000 mL | Freq: Once | OROMUCOSAL | Status: AC

## 2020-12-14 MED ORDER — FENTANYL CITRATE (PF) 100 MCG/2ML IJ SOLN
INTRAMUSCULAR | Status: DC | PRN
  Administered 2020-12-14: 50 ug via INTRAVENOUS

## 2020-12-14 MED ORDER — FENTANYL CITRATE (PF) 100 MCG/2ML IJ SOLN
INTRAMUSCULAR | Status: AC
  Filled 2020-12-14: qty 2

## 2020-12-14 MED ORDER — MIDAZOLAM HCL 2 MG/2ML IJ SOLN
INTRAMUSCULAR | Status: DC | PRN
  Administered 2020-12-14: 1 mg via INTRAVENOUS

## 2020-12-14 MED ORDER — ROPINIROLE HCL 1 MG PO TABS
3.0000 mg | ORAL_TABLET | Freq: Every day | ORAL | Status: DC
  Administered 2020-12-14: 3 mg via ORAL
  Filled 2020-12-14: qty 3

## 2020-12-14 MED ORDER — ROCURONIUM BROMIDE 10 MG/ML (PF) SYRINGE
PREFILLED_SYRINGE | INTRAVENOUS | Status: AC
  Filled 2020-12-14: qty 10

## 2020-12-14 MED ORDER — ROCURONIUM BROMIDE 10 MG/ML (PF) SYRINGE
PREFILLED_SYRINGE | INTRAVENOUS | Status: DC | PRN
  Administered 2020-12-14: 10 mg via INTRAVENOUS

## 2020-12-14 MED ORDER — LIDOCAINE 2% (20 MG/ML) 5 ML SYRINGE
INTRAMUSCULAR | Status: AC
  Filled 2020-12-14: qty 5

## 2020-12-14 MED ORDER — CEFAZOLIN SODIUM-DEXTROSE 1-4 GM/50ML-% IV SOLN
1.0000 g | Freq: Four times a day (QID) | INTRAVENOUS | Status: DC
  Filled 2020-12-14: qty 50

## 2020-12-14 MED ORDER — PHENYLEPHRINE HCL (PRESSORS) 10 MG/ML IV SOLN
INTRAVENOUS | Status: AC
  Filled 2020-12-14: qty 1

## 2020-12-14 MED ORDER — TAPENTADOL HCL 50 MG PO TABS
50.0000 mg | ORAL_TABLET | Freq: Four times a day (QID) | ORAL | Status: DC | PRN
  Administered 2020-12-14 – 2020-12-15 (×3): 50 mg via ORAL
  Filled 2020-12-14 (×3): qty 1

## 2020-12-14 MED ORDER — PROPOFOL 10 MG/ML IV BOLUS
INTRAVENOUS | Status: DC | PRN
  Administered 2020-12-14: 120 mg via INTRAVENOUS

## 2020-12-14 MED ORDER — METFORMIN HCL ER 500 MG PO TB24
1500.0000 mg | ORAL_TABLET | Freq: Every day | ORAL | Status: DC
  Administered 2020-12-15: 1500 mg via ORAL
  Filled 2020-12-14: qty 3

## 2020-12-14 MED ORDER — FUROSEMIDE 20 MG PO TABS: 20.0000 mg | ORAL_TABLET | ORAL | Status: DC

## 2020-12-14 MED ORDER — MIDAZOLAM HCL 2 MG/2ML IJ SOLN
INTRAMUSCULAR | Status: AC
  Filled 2020-12-14: qty 2

## 2020-12-14 MED ORDER — ACETAMINOPHEN 325 MG PO TABS: 325.0000 mg | ORAL_TABLET | Freq: Four times a day (QID) | ORAL | Status: DC | PRN

## 2020-12-14 MED ORDER — SUCCINYLCHOLINE CHLORIDE 200 MG/10ML IV SOSY
PREFILLED_SYRINGE | INTRAVENOUS | Status: AC
  Filled 2020-12-14: qty 10

## 2020-12-14 MED ORDER — LACTATED RINGERS IV SOLN: INTRAVENOUS | Status: DC

## 2020-12-14 MED ORDER — METOCLOPRAMIDE HCL 5 MG PO TABS: 5.0000 mg | ORAL_TABLET | Freq: Three times a day (TID) | ORAL | Status: DC | PRN

## 2020-12-14 MED ORDER — ONDANSETRON HCL 4 MG/2ML IJ SOLN
INTRAMUSCULAR | Status: DC | PRN
  Administered 2020-12-14: 4 mg via INTRAVENOUS

## 2020-12-14 MED ORDER — POTASSIUM CHLORIDE ER 10 MEQ PO TBCR
10.0000 meq | EXTENDED_RELEASE_TABLET | Freq: Every day | ORAL | Status: DC
  Administered 2020-12-14 – 2020-12-15 (×2): 10 meq via ORAL
  Filled 2020-12-14 (×4): qty 1

## 2020-12-14 MED ORDER — DAPAGLIFLOZIN PROPANEDIOL 10 MG PO TABS
10.0000 mg | ORAL_TABLET | Freq: Every day | ORAL | Status: DC
  Administered 2020-12-15: 10 mg via ORAL
  Filled 2020-12-14: qty 1

## 2020-12-14 MED ORDER — CEFAZOLIN SODIUM-DEXTROSE 2-4 GM/100ML-% IV SOLN
2.0000 g | INTRAVENOUS | Status: AC
  Administered 2020-12-14: 2 g via INTRAVENOUS
  Filled 2020-12-14: qty 100

## 2020-12-14 MED ORDER — PANTOPRAZOLE SODIUM 40 MG PO TBEC
40.0000 mg | DELAYED_RELEASE_TABLET | Freq: Every day | ORAL | Status: DC
  Administered 2020-12-14: 40 mg via ORAL
  Filled 2020-12-14: qty 1

## 2020-12-14 MED ORDER — BUPIVACAINE-EPINEPHRINE (PF) 0.25% -1:200000 IJ SOLN
INTRAMUSCULAR | Status: AC
  Filled 2020-12-14: qty 30

## 2020-12-14 MED ORDER — PHENOL 1.4 % MT LIQD: 1.0000 | OROMUCOSAL | Status: DC | PRN

## 2020-12-14 MED ORDER — SODIUM CHLORIDE 0.9 % IV SOLN
1.0000 g | Freq: Four times a day (QID) | INTRAVENOUS | Status: AC
  Administered 2020-12-14 – 2020-12-15 (×3): 1 g via INTRAVENOUS
  Filled 2020-12-14: qty 1
  Filled 2020-12-14: qty 10
  Filled 2020-12-14: qty 1

## 2020-12-14 MED ORDER — SUCCINYLCHOLINE CHLORIDE 200 MG/10ML IV SOSY
PREFILLED_SYRINGE | INTRAVENOUS | Status: DC | PRN
  Administered 2020-12-14: 160 mg via INTRAVENOUS

## 2020-12-14 MED ORDER — SPIRONOLACTONE 12.5 MG HALF TABLET
12.5000 mg | ORAL_TABLET | Freq: Every day | ORAL | Status: DC
  Administered 2020-12-14 – 2020-12-15 (×2): 12.5 mg via ORAL
  Filled 2020-12-14 (×2): qty 1

## 2020-12-14 MED ORDER — DOCUSATE SODIUM 100 MG PO CAPS
100.0000 mg | ORAL_CAPSULE | Freq: Two times a day (BID) | ORAL | Status: DC
  Administered 2020-12-14 – 2020-12-15 (×3): 100 mg via ORAL
  Filled 2020-12-14 (×3): qty 1

## 2020-12-14 MED ORDER — ASPIRIN EC 81 MG PO TBEC
81.0000 mg | DELAYED_RELEASE_TABLET | Freq: Every day | ORAL | Status: DC
  Administered 2020-12-14 – 2020-12-15 (×2): 81 mg via ORAL
  Filled 2020-12-14 (×2): qty 1

## 2020-12-14 MED ORDER — ONDANSETRON HCL 4 MG PO TABS: 4.0000 mg | ORAL_TABLET | Freq: Four times a day (QID) | ORAL | Status: DC | PRN

## 2020-12-14 MED ORDER — MORPHINE SULFATE (PF) 2 MG/ML IV SOLN: 0.5000 mg | INTRAVENOUS | Status: DC | PRN

## 2020-12-14 MED ORDER — SODIUM CHLORIDE 0.9 % IV SOLN: INTRAVENOUS | Status: DC

## 2020-12-14 MED ORDER — ROSUVASTATIN CALCIUM 20 MG PO TABS
20.0000 mg | ORAL_TABLET | Freq: Every day | ORAL | Status: DC
  Administered 2020-12-14: 20 mg via ORAL
  Filled 2020-12-14 (×2): qty 1

## 2020-12-14 MED ORDER — METOCLOPRAMIDE HCL 5 MG/ML IJ SOLN: 5.0000 mg | Freq: Three times a day (TID) | INTRAMUSCULAR | Status: DC | PRN

## 2020-12-14 MED ORDER — NUCYNTA 50 MG PO TABS: 50.0000 mg | ORAL_TABLET | Freq: Four times a day (QID) | ORAL | 0 refills | Status: DC | PRN

## 2020-12-14 MED ORDER — BUPIVACAINE HCL (PF) 0.5 % IJ SOLN
INTRAMUSCULAR | Status: DC | PRN
  Administered 2020-12-14: 15 mL via PERINEURAL

## 2020-12-14 MED ORDER — CHLORHEXIDINE GLUCONATE 0.12 % MT SOLN
15.0000 mL | Freq: Once | OROMUCOSAL | Status: AC
  Administered 2020-12-14: 15 mL via OROMUCOSAL

## 2020-12-14 MED ORDER — CYCLOSPORINE 0.05 % OP EMUL
1.0000 [drp] | Freq: Two times a day (BID) | OPHTHALMIC | Status: DC
  Filled 2020-12-14 (×2): qty 1

## 2020-12-14 MED ORDER — MONTELUKAST SODIUM 10 MG PO TABS
10.0000 mg | ORAL_TABLET | Freq: Every day | ORAL | Status: DC
  Administered 2020-12-14: 10 mg via ORAL
  Filled 2020-12-14: qty 1

## 2020-12-14 MED ORDER — LIDOCAINE 2% (20 MG/ML) 5 ML SYRINGE
INTRAMUSCULAR | Status: DC | PRN
  Administered 2020-12-14: 20 mg via INTRAVENOUS

## 2020-12-14 MED ORDER — VITAMIN D 25 MCG (1000 UNIT) PO TABS
1000.0000 [IU] | ORAL_TABLET | Freq: Every day | ORAL | Status: DC
  Administered 2020-12-14 – 2020-12-15 (×2): 1000 [IU] via ORAL
  Filled 2020-12-14 (×2): qty 1

## 2020-12-14 SURGICAL SUPPLY — 69 items
BAG ZIPLOCK 12X15 (MISCELLANEOUS) IMPLANT
BIT DRILL 1.6MX128 (BIT) IMPLANT
BIT DRILL 170X2.5X (BIT) ×1 IMPLANT
BIT DRL 170X2.5X (BIT) ×1
BLADE SAG 18X100X1.27 (BLADE) ×2 IMPLANT
CHLORAPREP W/TINT 26 (MISCELLANEOUS) ×2 IMPLANT
CLOSURE STERI STRIP 1/2 X4 (GAUZE/BANDAGES/DRESSINGS) ×2 IMPLANT
COVER BACK TABLE 60X90IN (DRAPES) ×2 IMPLANT
COVER SURGICAL LIGHT HANDLE (MISCELLANEOUS) ×2 IMPLANT
COVER WAND RF STERILE (DRAPES) IMPLANT
DECANTER SPIKE VIAL GLASS SM (MISCELLANEOUS) ×2 IMPLANT
DRAPE INCISE 23X17 IOBAN STRL (DRAPES) ×2
DRAPE INCISE IOBAN 23X17 STRL (DRAPES) ×2 IMPLANT
DRAPE ORTHO SPLIT 77X108 STRL (DRAPES) ×4
DRAPE SHEET LG 3/4 BI-LAMINATE (DRAPES) ×2 IMPLANT
DRAPE SURG ORHT 6 SPLT 77X108 (DRAPES) ×2 IMPLANT
DRAPE TOP 10253 STERILE (DRAPES) ×2 IMPLANT
DRAPE U-SHAPE 47X51 STRL (DRAPES) ×4 IMPLANT
DRILL 2.5 (BIT) ×2
DRSG ADAPTIC 3X8 NADH LF (GAUZE/BANDAGES/DRESSINGS) ×2 IMPLANT
DRSG PAD ABDOMINAL 8X10 ST (GAUZE/BANDAGES/DRESSINGS) ×2 IMPLANT
ECCENTRIC EPIPHYSI MODULAR SZ1 (Trauma) ×1 IMPLANT
ELECT BLADE TIP CTD 4 INCH (ELECTRODE) ×2 IMPLANT
ELECT NEEDLE TIP 2.8 STRL (NEEDLE) ×2 IMPLANT
ELECT REM PT RETURN 15FT ADLT (MISCELLANEOUS) ×2 IMPLANT
FACESHIELD WRAPAROUND (MASK) ×2 IMPLANT
GAUZE SPONGE 4X4 12PLY STRL (GAUZE/BANDAGES/DRESSINGS) ×2 IMPLANT
GLENOSPHERE DELTA XTEND LAT 38 (Miscellaneous) ×2 IMPLANT
GLOVE BIOGEL PI ORTHO PRO 7.5 (GLOVE) ×1
GLOVE PI ORTHO PRO STRL 7.5 (GLOVE) ×1 IMPLANT
GLOVE SURG ORTHO LTX SZ7.5 (GLOVE) ×2 IMPLANT
GLOVE SURG ORTHO LTX SZ8.5 (GLOVE) ×2 IMPLANT
GLOVE SURG POLY ORTHO LF SZ8 (GLOVE) ×2 IMPLANT
GOWN STRL REUS W/TWL XL LVL3 (GOWN DISPOSABLE) ×4 IMPLANT
KIT BASIN OR (CUSTOM PROCEDURE TRAY) ×2 IMPLANT
KIT TURNOVER KIT A (KITS) ×2 IMPLANT
MANIFOLD NEPTUNE II (INSTRUMENTS) ×2 IMPLANT
METAGLENE DELTA EXTEND (Trauma) ×1 IMPLANT
METAGLENE DXTEND (Trauma) ×2 IMPLANT
MODULAR ECCENTRIC EPIPHYSI SZ1 (Trauma) ×2 IMPLANT
NEEDLE MAYO CATGUT SZ4 (NEEDLE) IMPLANT
NS IRRIG 1000ML POUR BTL (IV SOLUTION) ×2 IMPLANT
PACK SHOULDER (CUSTOM PROCEDURE TRAY) ×2 IMPLANT
PIN GUIDE 1.2 (PIN) ×2 IMPLANT
PIN GUIDE GLENOPHERE 1.5MX300M (PIN) ×2 IMPLANT
PIN METAGLENE 2.5 (PIN) ×2 IMPLANT
PROTECTOR NERVE ULNAR (MISCELLANEOUS) ×2 IMPLANT
RESTRAINT HEAD UNIVERSAL NS (MISCELLANEOUS) ×2 IMPLANT
SCREW 4.5X36MM (Screw) ×2 IMPLANT
SCREW LOCK DELTA XTEND 4.5X30 (Screw) ×2 IMPLANT
SLING ARM FOAM STRAP LRG (SOFTGOODS) ×2 IMPLANT
SMARTMIX MINI TOWER (MISCELLANEOUS)
SPACER 38 PLUS 3 (Spacer) ×2 IMPLANT
SPONGE LAP 4X18 RFD (DISPOSABLE) ×2 IMPLANT
STEM DELTA DIA 10 HA (Stem) ×2 IMPLANT
STRIP CLOSURE SKIN 1/2X4 (GAUZE/BANDAGES/DRESSINGS) ×2 IMPLANT
SUCTION FRAZIER HANDLE 10FR (MISCELLANEOUS) ×2
SUCTION TUBE FRAZIER 10FR DISP (MISCELLANEOUS) ×1 IMPLANT
SUT FIBERWIRE #2 38 T-5 BLUE (SUTURE) ×4
SUT MNCRL AB 4-0 PS2 18 (SUTURE) ×2 IMPLANT
SUT VIC AB 0 CT1 36 (SUTURE) ×4 IMPLANT
SUT VIC AB 0 CT2 27 (SUTURE) ×2 IMPLANT
SUT VIC AB 2-0 CT1 27 (SUTURE) ×2
SUT VIC AB 2-0 CT1 TAPERPNT 27 (SUTURE) ×1 IMPLANT
SUTURE FIBERWR #2 38 T-5 BLUE (SUTURE) ×2 IMPLANT
TAPE CLOTH SURG 4X10 WHT LF (GAUZE/BANDAGES/DRESSINGS) ×2 IMPLANT
TOWEL OR 17X26 10 PK STRL BLUE (TOWEL DISPOSABLE) ×2 IMPLANT
TOWER SMARTMIX MINI (MISCELLANEOUS) IMPLANT
WATER STERILE IRR 1000ML POUR (IV SOLUTION) ×4 IMPLANT

## 2020-12-14 NOTE — Transfer of Care (Signed)
Immediate Anesthesia Transfer of Care Note  Patient: [name]  Procedure(s) Performed: REVERSE SHOULDER ARTHROPLASTY (Right Shoulder)  Patient Location: PACU  Anesthesia Type:General and GA combined with regional for post-op pain  Level of Consciousness: awake  Airway & Oxygen Therapy: Patient Spontanous Breathing and Patient connected to face mask oxygen  Post-op Assessment: Report given to RN and Post -op Vital signs reviewed and stable  Post vital signs: Reviewed and stable  Last Vitals:  Vitals Value Taken Time  BP    Temp    Pulse 76 12/14/20 0926  Resp 20 12/14/20 0926  SpO2 100 % 12/14/20 0926  Vitals shown include unvalidated device data.  Last Pain:  Vitals:   12/14/20 0624  TempSrc: Oral         Complications: No complications documented.

## 2020-12-14 NOTE — Anesthesia Procedure Notes (Signed)
Anesthesia Regional Block: Interscalene brachial plexus block   Pre-Anesthetic Checklist: ,, timeout performed, Correct Patient, Correct Site, Correct Laterality, Correct Procedure, Correct Position, site marked, Risks and benefits discussed,  Surgical consent,  Pre-op evaluation,  At surgeon's request and post-op pain management  Laterality: Right  Prep: Maximum Sterile Barrier Precautions used, chloraprep       Needles:  Injection technique: Single-shot  Needle Type: Echogenic Stimulator Needle     Needle Length: 4cm  Needle Gauge: 22     Additional Needles:   Procedures:,,,, ultrasound used (permanent image in chart),,,,  Narrative:  Start time: 12/14/2020 6:55 AM End time: 12/14/2020 7:03 AM Injection made incrementally with aspirations every 5 mL.  Performed by: Personally  Anesthesiologist: Freddrick March, MD  Additional Notes: Monitors applied. No increased pain on injection. No increased resistance to injection. Injection made in 5cc increments. Good needle visualization. Patient tolerated procedure well.

## 2020-12-14 NOTE — Anesthesia Procedure Notes (Signed)
Procedure Name: Intubation Date/Time: 12/14/2020 7:47 AM Performed by: Sharlette Dense, CRNA Patient Re-evaluated:Patient Re-evaluated prior to induction Oxygen Delivery Method: Circle system utilized Preoxygenation: Pre-oxygenation with 100% oxygen Induction Type: IV induction Ventilation: Mask ventilation without difficulty Laryngoscope Size: Glidescope and 3 Grade View: Grade I Tube type: Oral Tube size: 7.0 mm Number of attempts: 1 Airway Equipment and Method: Video-laryngoscopy and Stylet Placement Confirmation: ETT inserted through vocal cords under direct vision,  positive ETCO2 and breath sounds checked- equal and bilateral Secured at: 21 cm Tube secured with: Tape Dental Injury: Teeth and Oropharynx as per pre-operative assessment  Comments: 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  Patient extubated self in ICU while on ventilator.  Voice hoarse prior to current intubation

## 2020-12-14 NOTE — Anesthesia Procedure Notes (Signed)
Date/Time: 12/14/2020 6:57 AM Performed by: Sharlette Dense, CRNA Oxygen Delivery Method: Nasal cannula

## 2020-12-14 NOTE — Evaluation (Signed)
Occupational Therapy Evaluation Patient Details Name: [name] MRN: [id number] DOB: 1946-04-11 Today's Date: 12/14/2020    History of Present Illness Patient is a 75 year old female, s/p rTSA to RT shoulder. Pt was admitted from 08/03/20 to 1/6 for Covid pneumonia. PMH also incudes CHF with EF 20-25%.   Clinical Impression   Pt is a 75 year old female, s/p shoulder replacement without functional use of RT dominant upper extremity secondary to effects of surgery and interscalene block and shoulder precautions. Therapist provided education and instruction to patient and spouse in regards to exercises, precautions, positioning, donning upper extremity clothing and bathing while maintaining shoulder precautions, ice and edema management and donning/doffing sling. Patient and spouse verbalized understanding and demonstrated as needed. Patient needed assistance to donn shirt, underwear, pants, socks and shoes and provided with instruction on compensatory strategies to perform ADLs. Patient limited by decreased ROM in RT shoulder so therefore will need some form of assistance at home. Patient and spouse verbalized and/or demonstrated understanding to all instruction. Patient to follow up with MD for further therapy needs.     Follow Up Recommendations  Follow surgeon's recommendation for DC plan and follow-up therapies    Equipment Recommendations  None recommended by OT    Recommendations for Other Services       Precautions / Restrictions Precautions Precautions: Shoulder Shoulder Interventions: Off for dressing/bathing/exercises;Shoulder sling/immobilizer;At all times Precaution Booklet Issued: Yes (comment) Precaution Comments: No P/AROM to RT shoulder. Required Braces or Orthoses: Sling Restrictions Weight Bearing Restrictions: Yes RUE Weight Bearing: Non weight bearing Other Position/Activity Restrictions: NWB RUE      Mobility Bed Mobility Overal bed mobility: Needs  Assistance Bed Mobility: Supine to Sit     Supine to sit: Min guard;HOB elevated     General bed mobility comments: Min guard HHA with HOB elevated.    Transfers Overall transfer level: Needs assistance   Transfers: Sit to/from Stand;Stand Pivot Transfers Sit to Stand: Supervision Stand pivot transfers: Supervision            Balance Overall balance assessment: Mild deficits observed, not formally tested                                         ADL either performed or assessed with clinical judgement   ADL Overall ADL's : Needs assistance/impaired Eating/Feeding: Set up;Sitting   Grooming: Sitting;Set up;Standing   Upper Body Bathing: Moderate assistance;Sitting Upper Body Bathing Details (indicate cue type and reason): Pt and husband educated on "Seesaw" flossing method and dangle method for axillary hygiene. Lower Body Bathing: Minimal assistance;Sit to/from stand   Upper Body Dressing : Moderate assistance;Cueing for sequencing;Cueing for compensatory techniques Upper Body Dressing Details (indicate cue type and reason): Pt and spouse educated on compensatory UE dressing stratagies. Lower Body Dressing: Minimal assistance Lower Body Dressing Details (indicate cue type and reason): Pt and spouse shown 1-handed technique for socks. Pt wear slip on shoes and ed on eleastic waist bands, eaasy on/off clothing. Toilet Transfer: Sales executive;Ambulation   Toileting- Clothing Manipulation and Hygiene: Min guard Toileting - Clothing Manipulation Details (indicate cue type and reason): Min guard for clothing management. Pt able to perform her own hygiene     Functional mobility during ADLs: Supervision/safety       Vision Baseline Vision/History: Wears glasses Wears Glasses: At all times Patient Visual Report: No change from baseline  Perception     Praxis      Pertinent Vitals/Pain Pain Assessment: 0-10 Pain Score: 4   Pain Location: Anterior RT shoulder Pain Descriptors / Indicators: Burning Pain Intervention(s): Monitored during session (Pt denied need for pain meds)     Hand Dominance Right   Extremity/Trunk Assessment Upper Extremity Assessment Upper Extremity Assessment: RUE deficits/detail;LUE deficits/detail RUE: Unable to fully assess due to immobilization RUE Sensation: decreased light touch;decreased proprioception RUE Coordination: decreased fine motor;decreased gross motor (Still numb from surgery.) LUE Deficits / Details: WFL LUE Sensation: WNL LUE Coordination: WNL   Lower Extremity Assessment Lower Extremity Assessment: Overall WFL for tasks assessed   Cervical / Trunk Assessment Cervical / Trunk Assessment: Normal   Communication Communication Communication: No difficulties   Cognition Arousal/Alertness: Awake/alert Behavior During Therapy: WFL for tasks assessed/performed Overall Cognitive Status: Within Functional Limits for tasks assessed                                     General Comments       Exercises Other Exercises Other Exercises: Pt was educated on full hand/wrist/forearm/elbow post-op exercises with handout provided. OT demonstrated each exercises several reps. Pt remains numb without active RT UE movement. Pt verbalized understanding to perform all exercises 10 reps 3 times a day once able to actively move elbow-->hand.  Pt and spouse cautioned against any A/PROM to RT shoulder. Pt performed neck exercises x 5 reps each: latearl flexion, rotation and ext/flexion while sitting EOB.  Verb understanding re: Use of pillows to support behind upper arm and under forearm to give neck a break.   Shoulder Instructions Shoulder Instructions Donning/doffing shirt without moving shoulder: Caregiver independent with task;Patient able to independently direct caregiver;Moderate assistance Method for sponge bathing under operated UE: Maximal  assistance;Caregiver independent with task;Patient able to independently direct caregiver Donning/doffing sling/immobilizer: Moderate assistance;Caregiver independent with task;Patient able to independently direct caregiver Correct positioning of sling/immobilizer: Independent;Patient able to independently direct caregiver ROM for elbow, wrist and digits of operated UE: Caregiver independent with task;Patient able to independently direct caregiver Sling wearing schedule (on at all times/off for ADL's): Caregiver independent with task;Patient able to independently direct caregiver;Independent Proper positioning of operated UE when showering: Caregiver independent with task;Patient able to independently direct caregiver;Independent Dressing change: Caregiver independent with task;Patient able to independently direct caregiver Positioning of UE while sleeping: Caregiver independent with task;Patient able to independently direct caregiver;Independent    Home Living Family/patient expects to be discharged to:: Private residence Living Arrangements: Children;Spouse/significant other Available Help at Discharge: Family;Available 24 hours/day Type of Home: House Home Access: Stairs to enter [employer] of Steps: 1 with LT rail, ascending Entrance Stairs-Rails: Left Home Layout: One level     Bathroom Shower/Tub: Teacher, early years/pre: Handicapped height     Home Equipment: Environmental consultant - 2 wheels   Additional Comments: Hurry-cane      Prior Functioning/Environment Level of Independence: Independent with assistive device(s)        Comments: Pt ambulates independently inside home and with hurry-cane outside. No falls in past 6 months. Independent with ADLs and IADLs despite pre-op shoulder pain.        OT Problem List: Decreased range of motion;Decreased strength;Decreased coordination;Impaired sensation;Pain      OT Treatment/Interventions:      OT Goals(Current  goals can be found in the care plan section) Acute Rehab OT Goals Patient Stated Goal:  To go home tomorrow. OT Goal Formulation: With patient/family Time For Goal Achievement: 12/15/20 Potential to Achieve Goals: Good  OT Frequency:     Barriers to D/C:            Co-evaluation              AM-PAC OT "6 Clicks" Daily Activity     Outcome Measure Help from another person eating meals?: A Little Help from another person taking care of personal grooming?: A Little Help from another person toileting, which includes using toliet, bedpan, or urinal?: A Little Help from another person bathing (including washing, rinsing, drying)?: A Lot Help from another person to put on and taking off regular upper body clothing?: A Lot Help from another person to put on and taking off regular lower body clothing?: A Little 6 Click Score: 16   End of Session Equipment Utilized During Treatment:  (sling) Nurse Communication:  (OT education completed)  Activity Tolerance: Patient tolerated treatment well Patient left: in chair;with chair alarm set;with family/visitor present;with call bell/phone within reach  OT Visit Diagnosis: Muscle weakness (generalized) (M62.81);Pain Pain - Right/Left: Right Pain - part of body: Shoulder                Time: 1230-1315 OT Time Calculation (min): 45 min Charges:  OT General Charges $OT Visit: 1 Visit OT Evaluation $OT Eval Low Complexity: 1 Low OT Treatments $Self Care/Home Management : 23-37 mins  Anderson Malta, OT Acute Rehab Services Office: [phone] 12/14/2020  Julien Girt 12/14/2020, 1:37 PM

## 2020-12-14 NOTE — Interval H&P Note (Signed)
History and Physical Interval Note:  12/14/2020 7:34 AM  [name]  has presented today for surgery, with the diagnosis of Righ shoulder osteoarthritis.  The various methods of treatment have been discussed with the patient and family. After consideration of risks, benefits and other options for treatment, the patient has consented to  Procedure(s) with comments: REVERSE SHOULDER ARTHROPLASTY (Right) - with interscalene block as a surgical intervention.  The patient's history has been reviewed, patient examined, no change in status, stable for surgery.  I have reviewed the patient's chart and labs.  Questions were answered to the patient's satisfaction.     Augustin Schooling

## 2020-12-14 NOTE — Anesthesia Postprocedure Evaluation (Signed)
Anesthesia Post Note  Patient: [name]  Procedure(s) Performed: REVERSE SHOULDER ARTHROPLASTY (Right Shoulder)     Patient location during evaluation: PACU Anesthesia Type: Regional and General Level of consciousness: awake and alert Pain management: pain level controlled Vital Signs Assessment: post-procedure vital signs reviewed and stable Respiratory status: spontaneous breathing, nonlabored ventilation, respiratory function stable and patient connected to nasal cannula oxygen Cardiovascular status: blood pressure returned to baseline and stable Postop Assessment: no apparent nausea or vomiting Anesthetic complications: no   No complications documented.  Last Vitals:  Vitals:   12/14/20 1045 12/14/20 1108  BP: (!) 116/52 (!) 131/58  Pulse: (!) 56 (!) 59  Resp: 11 14  Temp: (!) 36.4 C   SpO2: 100% 100%    Last Pain:  Vitals:   12/14/20 1115  TempSrc:   PainSc: 3                  Roisin Mones L Nainika Newlun

## 2020-12-14 NOTE — Discharge Instructions (Signed)
   In most cases prophylactic antibiotics for Dental procdeures after total joint surgery are not necessary.  Exceptions are as follows:  1. History of prior total joint infection  2. Severely immunocompromised (Organ Transplant, cancer chemotherapy, Rheumatoid biologic meds such as [place])  3. Poorly controlled diabetes (A1C &gt; 8.0, blood glucose over 200)  If you have one of these conditions, contact your surgeon for an antibiotic prescription, prior to your dental procedure. Orthopedic Discharge Summary        Physician Discharge Summary  Patient ID: [name] MRN: [id number] DOB/AGE: 16-Nov-1945 75 y.o.  Admit date: 12/14/2020 Discharge date: 12/15/2020  Procedures:  Procedure(s) (LRB): REVERSE SHOULDER ARTHROPLASTY (Right)  Attending Physician:  Dr. Esmond Plants  Admission Diagnoses:   Right shoulder rotator cuff tear arthropathy  Discharge Diagnoses:  same   Past Medical History:  Diagnosis Date  . Arthritis   . Chronic back pain    buldging disc,scoliosis,arthritis  . Congestive heart failure with left ventricular systolic dysfunction ([place]) 08/24/2020  . Diabetes mellitus without complication ([place])    takes Trulicity,Jardiance,and Metformin daily.Average fasting blood sugar runs around130  . GERD (gastroesophageal reflux disease)    takes Omeprazole daily  . History of bronchitis > 8 yrs ago  . History of shingles   . HTN (hypertension)    takes Amlodipine and Micardis daily  . Hx of colonic polyps    benign  . Internal and external hemorrhoids without complication   . Joint pain   . Mitral valve prolapse   . Nocturia   . PONV (postoperative nausea and vomiting)    when gets injections in joints gets hives.Betadine rash  . Restless leg syndrome    takes Requip at bedtime  . Seasonal allergies    takes Claritin daily as needed  . Urinary frequency   . Uterine fibroid     PCP: Leamon Arnt, MD   Discharged Condition: good  Hospital  Course:  Patient underwent the above stated procedure on 12/14/2020. Patient tolerated the procedure well and brought to the recovery room in good condition and subsequently to the floor. Patient had an uncomplicated hospital course and was stable for discharge.   Disposition:  with follow up in 2 weeks    Follow-up Information    Netta Cedars, MD. Call in 2 weeks.   Specialty: Orthopedic Surgery Why: [phone] Contact information: [address] [place] [address] [phone]               Dental Antibiotics:  In most cases prophylactic antibiotics for Dental procdeures after total joint surgery are not necessary.  Exceptions are as follows:  1. History of prior total joint infection  2. Severely immunocompromised (Organ Transplant, cancer chemotherapy, Rheumatoid biologic meds such as [place])  3. Poorly controlled diabetes (A1C &gt; 8.0, blood glucose over 200)  If you have one of these conditions, contact your surgeon for an antibiotic prescription, prior to your dental procedure.        Signed: Augustin Schooling 12/14/2020, 9:35 AM  [facility] is now [facility] [address] [place], [place], [place] [address] Phone: [place]

## 2020-12-14 NOTE — Brief Op Note (Signed)
12/14/2020  9:27 AM  PATIENT:  [name]  75 y.o. female  PRE-OPERATIVE DIAGNOSIS:  Right shoulder osteoarthritis/ rotator cuff  insufficiency  POST-OPERATIVE DIAGNOSIS: same  PROCEDURE:  Procedure(s): REVERSE SHOULDER ARTHROPLASTY (Right)  DePuy Delta Xtend with no subscap repair  SURGEON:  Surgeon(s) and Role:    Netta Cedars, MD - Primary  PHYSICIAN ASSISTANT:   ASSISTANTS: Ventura Bruns, PA-C   ANESTHESIA:   regional and general  EBL:  75 mL   BLOOD ADMINISTERED:none  DRAINS: none   LOCAL MEDICATIONS USED:  MARCAINE     SPECIMEN:  No Specimen  DISPOSITION OF SPECIMEN:  N/A  COUNTS:  YES  TOURNIQUET:  * No tourniquets in log *  DICTATION: .Other Dictation: Dictation Number [id number]  PLAN OF CARE: Admit for overnight observation  PATIENT DISPOSITION:  PACU - hemodynamically stable.   Delay start of Pharmacological VTE agent (>24hrs) due to surgical blood loss or risk of bleeding: not applicable

## 2020-12-15 DIAGNOSIS — M19011 Primary osteoarthritis, right shoulder: Secondary | ICD-10-CM | POA: Diagnosis not present

## 2020-12-15 NOTE — Progress Notes (Signed)
Orthopedics Progress Note  Subjective: Patient reports minimal pain this AM, block still working  Objective:  Vitals:   12/15/20 0104 12/15/20 0519  BP: 102/82 116/63  Pulse: 90 92  Resp: 14 16  Temp: 98.5 F (36.9 C) 99.9 F (37.7 C)  SpO2: 96% 95%    General: Awake and alert  Musculoskeletal: Right shoulder incision CDI, dressing changed. Minimal swelling Neurovascularly intact  Lab Results  Component Value Date   WBC 6.8 12/11/2020   HGB 13.5 12/11/2020   HCT 41.3 12/11/2020   MCV 90.2 12/11/2020   PLT 255 12/11/2020       Component Value Date/Time   NA 137 12/11/2020 1134   NA 140 11/11/2020 0932   K 4.2 12/11/2020 1134   CL 103 12/11/2020 1134   CO2 24 12/11/2020 1134   GLUCOSE 153 (H) 12/11/2020 1134   BUN 25 (H) 12/11/2020 1134   BUN 26 11/11/2020 0932   CREATININE 1.13 (H) 12/11/2020 1134   CALCIUM 9.9 12/11/2020 1134   GFRNONAA 51 (L) 12/11/2020 1134   GFRAA 55 (L) 08/31/2020 1059    No results found for: INR, PROTIME  Assessment/Plan: POD #1 s/p Procedure(s): REVERSE SHOULDER ARTHROPLASTY Stable this AM, discharge after therapy  Remo Lipps R. Veverly Fells, MD 12/15/2020 7:41 AM

## 2020-12-15 NOTE — TOC Transition Note (Signed)
Transition of Care [facility]) - CM/SW Discharge Note  Patient Details  Name: [name] MRN: [id number] Date of Birth: 09-02-1945  Transition of Care [facility]) CM/SW Contact:  Sherie Don, LCSW Phone Number: 12/15/2020, 10:01 AM  Clinical Narrative: Patient is expected to discharge today. CSW met with patient and her husband to confirm discharge plan. Per patient, she was provided with some home exercises for her shoulder. Patient has a rolling walker and cane at home, so there are no DME needs at this time. TOC signing off.  Final next level of care: Home/Self Care Barriers to Discharge: No Barriers Identified  Patient Goals and CMS Choice Patient states their goals for this hospitalization and ongoing recovery are:: Discharge home CMS Medicare.gov Compare Post Acute Care list provided to:: Patient Choice offered to / list presented to : NA  Discharge Plan and Services        DME Arranged: N/A DME Agency: NA  Readmission Risk Interventions No flowsheet data found.

## 2020-12-15 NOTE — Op Note (Signed)
[name], HEMM MEDICAL RECORD NO: [id number] ACCOUNT NO: [id number] DATE OF BIRTH: [birth year] FACILITY: [facility] LOCATION: [id number] PHYSICIAN: Doran Heater. Veverly Fells, MD  Operative Report   DATE OF PROCEDURE: 12/14/2020   PREOPERATIVE DIAGNOSES:  Right shoulder rotator cuff tear arthropathy.  POSTOPERATIVE DIAGNOSES:  Right shoulder rotator cuff tear arthropathy.  PROCEDURE PERFORMED:  Right reverse shoulder replacement using DePuy Delta Xtend prosthesis with no subscap repair.  ATTENDING SURGEON:  Doran Heater. Veverly Fells, MD.  ASSISTANT:  Charletta Cousin [place], [place], who was scrubbed during the entire procedure, and necessary for satisfactory completion of surgery.  General anesthesia was used plus interscalene block.  ESTIMATED BLOOD LOSS:  Less than 100 mL  FLUID REPLACEMENT:  1500 mL crystalloid and instrument counts were correct.  No complications.  Preoperative antibiotics were given.  INDICATIONS:  The patient is a 75 year old female who presents with a history of worsening left shoulder pain and dysfunction secondary to rotator cuff tear arthropathy.  The patient has failed an extended period of conservative management, desires  operative treatment to restore function and eliminate pain.  Informed consent obtained.  DESCRIPTION OF PROCEDURE:  After an adequate level of anesthesia was achieved, the patient was positioned in modified beach chair position.  Right shoulder correctly identified and sterilely prepped and draped in the usual manner.  Timeout called,  verifying correct patient and correct site.  We entered the shoulder using a standard deltopectoral approach, turned to the coracoid process extending down to the anterior humerus.  Dissection down through the subcutaneous tissues were identified the  cephalic vein and took that laterally with the deltoid.  Pectoralis was taken medially.  Conjoined tendon identified and retracted medially.  We tenodesed the biceps in situ with 0 Vicryl  figure-of-eight suture x2.  We then went ahead and released the  subscapularis subperiosteally off the lesser tuberosity, tagged with #2 FiberWire to retract and protect the axillary nerve.  We then released the inferior capsule.  We noted there to be a significant erosion of the cartilage and exposed bone.  Rotator  cuff was insufficient superiorly.  We then went ahead and released the remaining anterior supraspinatus and infraspinatus was torn.  We then extended the shoulder and externally rotated and delivered the humeral head out of the wound.  We entered the  proximal humerus with a 6 mm reamer, reamed up to a size 10.  We then used a 10 mm T handled resection guide and resected the head at 10 degrees of retroversion with the oscillating saw.  Next, we removed osteophytes medially, then subluxed the humerus  posteriorly, gaining good exposure of the glenoid face.  We removed the capsule and the labrum and the remnant of the biceps.  We identified our center point for our guide pin drilled that and then did our central reaming and then peripheral hand reaming  to get a flush face on the glenoid.  We then drilled our central peg hole and impacted the HA coated metaglene baseplate onto the glenoid.  We then secured that with a 42 screw inferiorly and a 30 screw superiorly with good purchase.  We had a nice  baseplate security.  I did a finger sweep to make sure no soft tissue was incorporated.  We then went ahead and applied the 38 standard glenosphere onto the baseplate and screwed that secure.  We then directed our attention back toward the humeral side  and finished our metaphyseal preparation for the one right metaphyseal component.  We then used  the 10 stem with 1 right metaphysis set on the 0 setting and placed in 10 degrees of retroversion.  We impacted that as a trial into the humeral side, then  placed a 30+3 trial poly on the tray and reduced the shoulder.  We had good soft tissue balancing with  appropriate tension on the conjoined tendon.  No gapping with inferior pole or external rotation and full passive motion with no impingement.  We  removed the trial components, irrigated thoroughly and then used available bone graft from the humeral head and did impaction grafting technique for the press-fit HA coated stem size 10 and the one right metaphyseal again HA coated set in the 0 setting  and impacted in 10 degrees of retroversion, had a stable stem.  We then at this point applied the real 38+3 poly and impacted down on to that was stably reduced the shoulder and again pleased with our soft tissue balancing.  We resected the subscap  remnant, which was thin and we did not feel it would help her and then closed the deltopectoral interval with 0 Vicryl suture followed by 2-0 Vicryl for subcutaneous closure and 4-0 Monocryl for skin.  Steri-Strips were applied followed by sterile  dressing.  The patient tolerated surgery well.     Elián.Darby D: 12/14/2020 9:33:21 am T: 12/15/2020 2:33:00 am  JOB: [id number]/ [id number]

## 2020-12-15 NOTE — Discharge Summary (Signed)
In most cases prophylactic antibiotics for Dental procdeures after total joint surgery are not necessary.  Exceptions are as follows:  1. History of prior total joint infection  2. Severely immunocompromised (Organ Transplant, cancer chemotherapy, Rheumatoid biologic meds such as [place])  3. Poorly controlled diabetes (A1C &gt; 8.0, blood glucose over 200)  If you have one of these conditions, contact your surgeon for an antibiotic prescription, prior to your dental procedure. Orthopedic Discharge Summary        Physician Discharge Summary  Patient ID: [name] MRN: [id number] DOB/AGE: October 04, 1945 75 y.o.  Admit date: 12/14/2020 Discharge date: 12/15/2020   Procedures:  Procedure(s) (LRB): REVERSE SHOULDER ARTHROPLASTY (Right)  Attending Physician:  Dr. Esmond Plants  Admission Diagnoses:   Right shoulder cuff arthropathy  Discharge Diagnoses:  Right shoulder cuff arthropathy   Past Medical History:  Diagnosis Date  . Arthritis   . Chronic back pain    buldging disc,scoliosis,arthritis  . Congestive heart failure with left ventricular systolic dysfunction ([place]) 08/24/2020  . Diabetes mellitus without complication ([place])    takes Trulicity,Jardiance,and Metformin daily.Average fasting blood sugar runs around130  . GERD (gastroesophageal reflux disease)    takes Omeprazole daily  . History of bronchitis > 8 yrs ago  . History of shingles   . HTN (hypertension)    takes Amlodipine and Micardis daily  . Hx of colonic polyps    benign  . Internal and external hemorrhoids without complication   . Joint pain   . Mitral valve prolapse   . Nocturia   . PONV (postoperative nausea and vomiting)    when gets injections in joints gets hives.Betadine rash  . Restless leg syndrome    takes Requip at bedtime  . Seasonal allergies    takes Claritin daily as needed  . Urinary frequency   . Uterine fibroid     PCP: Leamon Arnt, MD   Discharged Condition:  good  Hospital Course:  Patient underwent the above stated procedure on 12/14/2020. Patient tolerated the procedure well and brought to the recovery room in good condition and subsequently to the floor. Patient had an uncomplicated hospital course and was stable for discharge.   Disposition: Discharge disposition: 01-Home or Self Care      with follow up in 2 weeks    Follow-up Information    Netta Cedars, MD. Call in 2 weeks.   Specialty: Orthopedic Surgery Why: [phone] Contact information: [address] [place] [address] [phone]               Dental Antibiotics:  In most cases prophylactic antibiotics for Dental procdeures after total joint surgery are not necessary.  Exceptions are as follows:  1. History of prior total joint infection  2. Severely immunocompromised (Organ Transplant, cancer chemotherapy, Rheumatoid biologic meds such as [place])  3. Poorly controlled diabetes (A1C &gt; 8.0, blood glucose over 200)  If you have one of these conditions, contact your surgeon for an antibiotic prescription, prior to your dental procedure.  Discharge Instructions    Call MD / Call 911   Complete by: As directed    If you experience chest pain or shortness of breath, CALL 911 and be transported to the hospital emergency room.  If you develope a fever above 101 F, pus (white drainage) or increased drainage or redness at the wound, or calf pain, call your surgeon's office.   Constipation Prevention   Complete by: As directed    Drink  plenty of fluids.  Prune juice may be helpful.  You may use a stool softener, such as Colace (over the counter) 100 mg twice a day.  Use MiraLax (over the counter) for constipation as needed.   Diet - low sodium heart healthy   Complete by: As directed    Increase activity slowly as tolerated   Complete by: As directed    Post-operative opioid taper instructions:   Complete by: As directed     POST-OPERATIVE OPIOID TAPER INSTRUCTIONS: It is important to wean off of your opioid medication as soon as possible. If you do not need pain medication after your surgery it is ok to stop day one. Opioids include: Codeine, Hydrocodone(Norco, Vicodin), Oxycodone(Percocet, oxycontin) and hydromorphone amongst others.  Long term and even short term use of opiods can cause: Increased pain response Dependence Constipation Depression Respiratory depression And more.  Withdrawal symptoms can include Flu like symptoms Nausea, vomiting And more Techniques to manage these symptoms Hydrate well Eat regular healthy meals Stay active Use relaxation techniques(deep breathing, meditating, yoga) Do Not substitute Alcohol to help with tapering If you have been on opioids for less than two weeks and do not have pain than it is ok to stop all together.  Plan to wean off of opioids This plan should start within one week post op of your joint replacement. Maintain the same interval or time between taking each dose and first decrease the dose.  Cut the total daily intake of opioids by one tablet each day Next start to increase the time between doses. The last dose that should be eliminated is the evening dose.         Allergies as of 12/15/2020      Reactions   Amlodipine Swelling   Depo-medrol [methylprednisolone] Hives   Gabapentin Rash   hives   Levothyroxine Other (See Comments)   severe muscle pain in neck   Sitagliptin Other (See Comments)   Urinary hesitancy Januvia   Ace Inhibitors    cough   Betadine [povidone Iodine]    rash   Codeine Nausea And Vomiting   Cortizone-10 [hydrocortisone] Other (See Comments)   unknown   Tetracyclines & Related Nausea And Vomiting   Dexamethasone Rash      Medication List    TAKE these medications   allopurinol 100 MG tablet Commonly known as: ZYLOPRIM Take 100 mg by mouth daily.   aspirin EC 81 MG tablet Take 81 mg by mouth daily.  Swallow whole.   carvedilol 3.125 MG tablet Commonly known as: COREG Take 1 tablet (3.125 mg total) by mouth 2 (two) times daily.   cholecalciferol 1000 units tablet Commonly known as: VITAMIN D Take 1,000 Units by mouth daily.   cycloSPORINE 0.05 % ophthalmic emulsion Commonly known as: RESTASIS Place 1 drop into both eyes 2 (two) times daily.   dapagliflozin propanediol 10 MG Tabs tablet Commonly known as: Farxiga Take 1 tablet (10 mg total) by mouth daily before breakfast.   Dulaglutide 0.75 MG/0.5ML Sopn Inject 0.75 mg into the skin once a week.   Entresto 49-51 MG Generic drug: sacubitril-valsartan Take 1 tablet by mouth 2 (two) times daily.   fluticasone 50 MCG/ACT nasal spray Commonly known as: FLONASE Place 1 spray into both nostrils daily as needed for allergies.   furosemide 20 MG tablet Commonly known as: LASIX Take one tablet (20mg ) on Monday, Wednesday and Friday and Sat. What changed:   how much to take  how to take this  when to take this   Magnesium Oxide 400 MG Caps Take 1 capsule (400 mg total) by mouth daily.   metFORMIN 750 MG 24 hr tablet Commonly known as: Glucophage XR Take 2 tablets (1,500 mg total) by mouth daily with breakfast.   montelukast 10 MG tablet Commonly known as: SINGULAIR Take 10 mg by mouth at bedtime.   multivitamin with minerals Tabs tablet Take 1 tablet by mouth daily.   Nucynta 50 MG tablet Generic drug: tapentadol Take 1 tablet (50 mg total) by mouth every 6 (six) hours as needed for moderate pain or severe pain. What changed:   when to take this  reasons to take this   omeprazole 40 MG capsule Commonly known as: PRILOSEC Take 40 mg by mouth daily.   potassium chloride 10 MEQ tablet Commonly known as: KLOR-CON Take 1 tablet (10 mEq total) by mouth daily.   pregabalin 100 MG capsule Commonly known as: LYRICA Take 1 capsule (100 mg total) by mouth 2 (two) times daily.   rOPINIRole 3 MG tablet Commonly  known as: REQUIP Take 3 mg by mouth at bedtime.   rosuvastatin 20 MG tablet Commonly known as: CRESTOR Take 1 tablet (20 mg total) by mouth daily.   spironolactone 25 MG tablet Commonly known as: ALDACTONE Take 0.5 tablets (12.5 mg total) by mouth daily.         Signed: Ventura Bruns 12/15/2020, 4:07 PM  [facility] is now [facility] [address] [place], [place],  [address] Phone: [place]

## 2020-12-15 NOTE — Progress Notes (Signed)
  Called Dr. Veverly Fells' office about doing discharge paperwork at 1230.   Medication reconciliation was not completed this AM for this patient, and no discharge order was placed.   Per MD note, patient was able to be discharged this morning, after OT was completed.   Called MD office a second time at 1337. Patient dressed and waiting in the room.   RN unable to print paperwork, or discharge patient from the hospital until MD/PA reconciles medications and places a discharge order.    Will continue to communicate process to patient.     [place], Therapist, sports

## 2020-12-15 NOTE — Plan of Care (Signed)
Plan of care reviewed and discussed with the patient. 

## 2020-12-15 NOTE — Progress Notes (Signed)
RN reviewed discharge instructions with patient and family. All questions answered.   Paperwork given. Prescriptions electronically sent to patient pharmacy.    NT rolled patient down with all belongings to family car.     Kanna Dafoe, RN  

## 2020-12-16 ENCOUNTER — Encounter (HOSPITAL_COMMUNITY): Payer: Self-pay | Admitting: Orthopedic Surgery

## 2021-01-14 ENCOUNTER — Ambulatory Visit: Payer: Medicare Other | Admitting: Family Medicine

## 2021-01-14 ENCOUNTER — Encounter: Payer: Self-pay | Admitting: Family Medicine

## 2021-01-14 ENCOUNTER — Other Ambulatory Visit: Payer: Self-pay

## 2021-01-14 VITALS — BP 136/80 | HR 71 | Temp 97.9°F | Ht 64.0 in | Wt 185.0 lb

## 2021-01-14 DIAGNOSIS — K219 Gastro-esophageal reflux disease without esophagitis: Secondary | ICD-10-CM

## 2021-01-14 DIAGNOSIS — Z96611 Presence of right artificial shoulder joint: Secondary | ICD-10-CM | POA: Diagnosis not present

## 2021-01-14 DIAGNOSIS — I5020 Unspecified systolic (congestive) heart failure: Secondary | ICD-10-CM | POA: Diagnosis not present

## 2021-01-14 DIAGNOSIS — IMO0002 Reserved for concepts with insufficient information to code with codable children: Secondary | ICD-10-CM

## 2021-01-14 DIAGNOSIS — E1165 Type 2 diabetes mellitus with hyperglycemia: Secondary | ICD-10-CM | POA: Diagnosis not present

## 2021-01-14 DIAGNOSIS — Z7185 Encounter for immunization safety counseling: Secondary | ICD-10-CM

## 2021-01-14 DIAGNOSIS — I428 Other cardiomyopathies: Secondary | ICD-10-CM

## 2021-01-14 DIAGNOSIS — E118 Type 2 diabetes mellitus with unspecified complications: Secondary | ICD-10-CM | POA: Diagnosis not present

## 2021-01-14 LAB — POCT GLYCOSYLATED HEMOGLOBIN (HGB A1C): Hemoglobin A1C: 9.6 % — AB (ref 4.0–5.6)

## 2021-01-14 MED ORDER — TRULICITY 1.5 MG/0.5ML ~~LOC~~ SOAJ: 1.5000 mg | SUBCUTANEOUS | 11 refills | Status: DC

## 2021-01-14 MED ORDER — GLIPIZIDE-METFORMIN HCL 2.5-500 MG PO TABS: 1.0000 | ORAL_TABLET | Freq: Two times a day (BID) | ORAL | 3 refills | Status: DC

## 2021-01-14 NOTE — Progress Notes (Signed)
Subjective  CC:  Chief Complaint  Patient presents with   Hypertension   Diabetes   Gastroesophageal Reflux    HPI: [name] is a 75 y.o. female who presents to the office today for follow up of diabetes and problems listed above in the chief complaint.  Diabetes follow up: Her diabetic control is reported as Worse.  She reports fastings are elevated in the morning now.  Last visit we stopped metaglip 5/500 and due to symptomatic hypoglycemia and several mornings.we replaced it with metformin XR 1500 daily.  She also takes Trulicity 9.70 mg weekly and Farxiga 10 mg daily.  She tolerates medications well.  Her diet is stable.      She denies exertional CP or SOB or symptomatic hypoglycemia. She denies foot sores or paresthesias.  Heart failure and nonischemic cardiomyopathy: Reviewed recent cardiology notes.  She has an echocardiogram scheduled for later this month to see if she has regained any function.  She is on multiple medications, she is euvolemic and denies palpitations or chest pain or shortness of breath. GERD: Continues to be well controlled on chronic PPI, has chronic hoarseness.  Reviewed recent ENT notes.  Has mild vocal cord weakness on one side.  May be related to reflux.  Continue PPI and she will be rechecked to see if it is improving.  She continues to have intermittent hoarseness. Status post shoulder replacement on right at the end of May.  She says she is recovering fairly well.  Still with significant pain with any use.  Has follow-up with the end of this month.  Wt Readings from Last 3 Encounters:  01/14/21 185 lb (83.9 kg)  12/02/20 189 lb 12.8 oz (86.1 kg)  11/11/20 188 lb 12.8 oz (85.6 kg)    BP Readings from Last 3 Encounters:  01/14/21 136/80  12/15/20 (!) 127/51  12/11/20 134/68    Assessment  1. Uncontrolled diabetes mellitus with complication, without long-term current use of insulin ([place])   2. Nonischemic cardiomyopathy ([place])   3. S/P shoulder  replacement, right   4. Congestive heart failure with left ventricular systolic dysfunction ([place])   5. Gastroesophageal reflux disease, unspecified whether esophagitis present   6. Allergy to statin medication   7. Vaccine counseling      Plan  Diabetes is currently poorly controlled.  We will adjust medications as follows: Increase Trulicity to 1.5 mg weekly.  Change back to medically but decrease glipizide dose, 2.5 /500 twice daily.  Continue Farxiga 10 mg daily.  Continue to eat a diabetic diet and recheck in 3 months.  Can add long-acting insulin at that time if needed. CHF and cardiomyopathy: Await echocardiogram.  No change in her medications at this time.  Euvolemic GERD: Continue Prilosec Status post right shoulder replacement: Continue rehab and follow-up.   Follow up: 3 months for diabetes recheck. Orders Placed This Encounter  Procedures   POCT HgB A1C   Meds ordered this encounter  Medications   glipiZIDE-metformin (METAGLIP) 2.5-500 MG tablet    Sig: Take 1 tablet by mouth 2 (two) times daily before a meal.    Dispense:  180 tablet    Refill:  3   Dulaglutide (TRULICITY) 1.75 YO/3.7CH SOPN    Sig: Inject 1.5 mg into the skin once a week.    Dispense:  2 mL    Refill:  11    Increasing dose from 0.75mg  weekly      Immunization History  Administered Date(s) Administered  Fluad Quad(high Dose 65+) 07/01/2020   Influenza Split 06/01/2010   Influenza, High Dose Seasonal PF 05/13/2014, 04/24/2015, 04/12/2017, 04/19/2018, 04/24/2019, 06/16/2020   Influenza, Seasonal, Injecte, Preservative Fre 04/09/2013, 05/03/2016   Influenza,inj,Quad PF,6+ Mos 05/01/2017   Influenza-Unspecified 04/03/2012   Pneumococcal Conjugate-13 05/13/2014   Pneumococcal Polysaccharide-23 02/15/2011   Td 08/01/1998   Tdap 05/01/2004   Zoster Recombinat (Shingrix) 04/24/2019, 08/13/2019   Zoster, Live 02/15/2011    Diabetes Related Lab Review: Lab Results  Component Value Date   HGBA1C  9.6 (A) 01/14/2021   HGBA1C 9.1 (H) 12/11/2020   HGBA1C 6.9 (H) 08/11/2020    No results found for: [facility] Lab Results  Component Value Date   CREATININE 1.13 (H) 12/11/2020   BUN 25 (H) 12/11/2020   NA 137 12/11/2020   K 4.2 12/11/2020   CL 103 12/11/2020   CO2 24 12/11/2020   No results found for: CHOL No results found for: HDL No results found for: [facility] Lab Results  Component Value Date   TRIG 194 (H) 08/13/2020   TRIG 357 (H) 08/12/2020   TRIG 260 (H) 08/10/2020   No results found for: CHOLHDL No results found for: LDLDIRECT The ASCVD Risk score Mikey Bussing DC Jr., et al., 2013) failed to calculate for the following reasons:   Cannot find a previous HDL lab I have reviewed the [place] history. 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 ([place]) 08/24/2020   Pressure injury of skin 08/14/2020   Primary osteoarthritis of left shoulder 07/29/2020   Allergy to statin medication 06/16/2020   DDD (degenerative disc disease), cervical 04/30/2020   Statin intolerance 08/22/2019   Osteoarthritis of left knee 11/05/2018   Chronic pansinusitis 10/04/2017   Chronic low back pain 15/11/6977   Complication of surgical procedure 09/05/2017   Degenerative lumbar disc 06/04/2015   S/P lumbar laminectomy 06/04/2015   Restless leg syndrome 05/13/2014   Controlled type 2 diabetes mellitus without complication, without long-term current use of insulin ([place]) 04/09/2013    Formatting of this note might be different from the original. New onset 04/2013, doesn't tolerate 1000mg  metformin due to GI upset.     Spinal stenosis of lumbar region without neurogenic claudication 09/12/2012    Formatting of this note might be different from the  original. Right L4/5     GERD (gastroesophageal reflux disease) 05/25/2011    Chronic recurrent heartburn symptoms for 3 or 4 years without weight loss, bleeding or dysphagia. Bonds well to daily PPI.Gatha Mayer, MD, [facility]      Hemorrhoids, internal, with bleeding 05/25/2011    Visible on 2005 colonoscopy and endoscopy 05/25/2011.     Obesity 05/25/2011   Hypertension associated with diabetes ([place]) 11/08/2010   Chronic allergic rhinitis 08/19/2010   Mitral valve prolapse 10/10/2008    Formatting of this note might be different from the original. 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 She has never used smokeless tobacco. She reports that she does not drink alcohol and does not use drugs.  Review of Systems: Ophthalmic: negative for eye pain, loss of vision or double vision Cardiovascular: negative for chest pain Respiratory: negative for SOB  or persistent cough Gastrointestinal: negative for abdominal pain Genitourinary: negative for dysuria or gross hematuria MSK: negative for foot lesions Neurologic: negative for weakness or gait disturbance  Objective  Vitals: BP 136/80   Pulse 71   Temp 97.9 F (36.6 C) (Temporal)   Ht 5\' 4"  (1.626 m)   Wt 185 lb (83.9 kg)   SpO2 96%   BMI 31.76 kg/m  General: well appearing, no acute distress, wearing sling on right arm, voice is hoarse Psych:  Alert and oriented, normal mood and affect Cardiovascular:  Nl S1 and S2, RRR without murmur, gallop or rub. no edema Respiratory:  Good breath sounds bilaterally, CTAB with normal effort, no rales     Diabetic education: ongoing education regarding chronic disease management for diabetes was given today. We continue to reinforce the ABC's of diabetic management: A1c (<7 or 8 dependent upon patient), tight blood pressure control, and cholesterol management with  goal LDL < 100 minimally. We discuss diet strategies, exercise recommendations, medication options and possible side effects. At each visit, we review recommended immunizations and preventive care recommendations for diabetics and stress that good diabetic control can prevent other problems. See below for this patient's data.   Commons side effects, risks, benefits, and alternatives for medications and treatment plan prescribed today were discussed, and the patient expressed understanding of the given instructions. Patient is instructed to call or message via MyChart if he/she has any questions or concerns regarding our treatment plan. No barriers to understanding were identified. We discussed Red Flag symptoms and signs in detail. Patient expressed understanding regarding what to do in case of urgent or emergency type symptoms.  Medication list was reconciled, printed and provided to the patient in AVS. Patient instructions and summary information was reviewed with the patient as documented in the AVS. This note was prepared with assistance of Dragon voice recognition software. Occasional wrong-word or sound-a-like substitutions may have occurred due to the inherent limitations of voice recognition software  This visit occurred during the SARS-CoV-2 public health emergency.  Safety protocols were in place, including screening questions prior to the visit, additional usage of staff PPE, and extensive cleaning of exam room while observing appropriate contact time as indicated for disinfecting solutions.

## 2021-01-14 NOTE — Patient Instructions (Signed)
Please return in 3 months for diabetes follow up   Please go to [facility] or other pharmacy to get your covid vaccinations.   Please stop the metformin XR and the trulicity 0.75mg  dose.  I have ordered metaglip 2.5-500 to be taken twice a day and have increased the trulicity dose to 1.5mg  weekly as we discussed. This should help bring your blood sugars down again.   If you have any questions or concerns, please don't hesitate to send me a message via MyChart or call the office at [phone]. Thank you for visiting with [place] today! It's our pleasure caring for you.

## 2021-01-26 ENCOUNTER — Ambulatory Visit (HOSPITAL_COMMUNITY): Payer: Medicare Other | Attending: Internal Medicine

## 2021-01-26 ENCOUNTER — Other Ambulatory Visit: Payer: Self-pay

## 2021-01-26 DIAGNOSIS — I5020 Unspecified systolic (congestive) heart failure: Secondary | ICD-10-CM

## 2021-01-26 LAB — ECHOCARDIOGRAM COMPLETE
Area-P 1/2: 3.66 cm2
S' Lateral: 4.3 cm

## 2021-02-04 ENCOUNTER — Ambulatory Visit (INDEPENDENT_AMBULATORY_CARE_PROVIDER_SITE_OTHER): Payer: Medicare Other | Admitting: Otolaryngology

## 2021-02-10 ENCOUNTER — Other Ambulatory Visit: Payer: Self-pay | Admitting: Family Medicine

## 2021-02-11 ENCOUNTER — Ambulatory Visit
Admission: RE | Admit: 2021-02-11 | Discharge: 2021-02-11 | Disposition: A | Discharge status: Home / Self Care | Payer: Medicare Other | Source: Ambulatory Visit | Attending: Family Medicine | Admitting: Family Medicine

## 2021-02-11 ENCOUNTER — Other Ambulatory Visit: Payer: Self-pay

## 2021-02-11 DIAGNOSIS — Z780 Asymptomatic menopausal state: Secondary | ICD-10-CM

## 2021-02-16 ENCOUNTER — Ambulatory Visit: Payer: Medicare Other | Admitting: Cardiology

## 2021-03-16 NOTE — Progress Notes (Signed)
Office Visit    Patient Name: [name] Date of Encounter: 03/17/2021  PCP:  Leamon Arnt, MD   [place]  Cardiologist:  Freada Bergeron, MD  Advanced Practice Provider:  No care team member to display Electrophysiologist:  None      Chief Complaint    [name] is a 75 y.o. female with a hx of DM2, hypertension, CKD 3, nonobstructive coronary artery disease, chronic back pain, restless leg syndrome, COVID-positive, chronic systolic heart failure presents today for follow-up of heart failure  Past Medical History    Past Medical History:  Diagnosis Date   Arthritis    Chronic back pain    buldging disc,scoliosis,arthritis   Congestive heart failure with left ventricular systolic dysfunction ([place]) 08/24/2020   Diabetes mellitus without complication ([place])    takes Trulicity,Jardiance,and Metformin daily.Average fasting blood sugar runs around130   GERD (gastroesophageal reflux disease)    takes Omeprazole daily   History of bronchitis > 8 yrs ago   History of shingles    HTN (hypertension)    takes Amlodipine and Micardis daily   Hx of colonic polyps    benign   Internal and external hemorrhoids without complication    Joint pain    Mitral valve prolapse    Nocturia    PONV (postoperative nausea and vomiting)    when gets injections in joints gets hives.Betadine rash   Restless leg syndrome    takes Requip at bedtime   Seasonal allergies    takes Claritin daily as needed   Urinary frequency    Uterine fibroid    Past Surgical History:  Procedure Laterality Date   BUNIONECTOMY Bilateral    COLONOSCOPY  07/12/2004   diverticulosis, internal and external hemorrhoids   COLONOSCOPY     FINGER ARTHROSCOPY WITH CARPOMETACARPEL ([place]) ARTHROPLASTY Right 09/28/2015   Procedure: RIGHT THUMB TRAPEZIUM EXCISION WITH CARPOMETACARPEL ([place]) ARTHROPLASTY AND TENDON TRANSFER;  Surgeon: Iran Planas, MD;  Location: [facility];  Service:  Orthopedics;  Laterality: Right;   HEMORRHOID SURGERY     almost 40 yrs ago   HYSTEROSCOPY WITH D & C  08/23/2000   and resectoscopic myomectomy   LUMBAR DISC SURGERY  03/16/2005   LUMBAR EPIDURAL INJECTION     PLANTAR FASCIA RELEASE Left 02/03/2010   and torn tendon   REVERSE SHOULDER ARTHROPLASTY Right 12/14/2020   Procedure: REVERSE SHOULDER ARTHROPLASTY;  Surgeon: Netta Cedars, MD;  Location: [facility];  Service: Orthopedics;  Laterality: Right;   RIGHT/LEFT HEART CATH AND CORONARY ANGIOGRAPHY N/A 09/07/2020   Procedure: RIGHT/LEFT HEART CATH AND CORONARY ANGIOGRAPHY;  Surgeon: Nelva Bush, MD;  Location: [facility];  Service: Cardiovascular;  Laterality: N/A;   TENDON TRANSFER Right 09/28/2015   Procedure: TENDON TRANSFER;  Surgeon: Iran Planas, MD;  Location: [facility];  Service: Orthopedics;  Laterality: Right;   TONSILLECTOMY     TUBAL LIGATION     WRIST SURGERY     left, removal of cyst    Allergies  Allergies  Allergen Reactions   Amlodipine Swelling   Depo-Medrol [Methylprednisolone] Hives   Gabapentin Rash    hives    Levothyroxine Other (See Comments)    severe muscle pain in neck     Sitagliptin Other (See Comments)    Urinary hesitancy Januvia   Ace Inhibitors     cough   Betadine [Povidone Iodine]     rash   Codeine Nausea And Vomiting   Cortizone-10 [  Hydrocortisone] Other (See Comments)    unknown   Tetracyclines & Related Nausea And Vomiting   Dexamethasone Rash    History of Present Illness    [name] is a 75 y.o. female with a hx of DM2, hypertension, CKD 3, nonobstructive coronary artery disease, chronic back pain, restless leg syndrome, COVID-positive, chronic systolic heart failure  last seen 11/11/2020 by Dr. Johney Frame.  She was diagnosed with new onset cardiomyopathy January 2022 after presenting with shortness of breath and found to be COVID-positive at the time.  Echocardiogram with LVEF 20 to 25%.  She returned to the hospital  shortly after discharge with progressive shortness of breath and lower extremity edema requiring ICU admission and intubation.  She was aggressively diuresed and extubated and eventually weaned to room air.  Initial weight 205 pounds and discharge weight 187 pounds.  Coronary angiography 09/07/2020 showing no significant obstructive disease.  She had mild plaquing of the mid LAD with 10 to 20% stenosis.  Repeat echocardiogram 10/15/2020 with mild improvement in LVEF 30 to [id number], grade 2 diastolic dysfunction, elevated LAP, normal RA, mildly dilated LA, mild MR.  Cardiac monitor placed for frequent ventricular ectopy revealed several runs of NSVT and frequent PVCs but no sustained arrhythmias no atrial fibrillation.  In clinic March 2122 she was feeling better, euvolemic and her [name] was increased and spironolactone initiated.  She was most recently seen in follow-up by Dr. Johney Frame 11/11/2020. Repeat echocardiogram was ordered and clearance provided for shoulder surgery. Echo 01/27/21 LVEF 45%, RV normal size and function, trivial MR.   She presents today for follow-up.  We reviewed her echocardiogram in detail and she was reassured by the results.  She tells me she has been staying busy working in her garden since it is cold and she is starting walking half mile for exercise.  Notes no chest pain, pressure, tightness.  Notes dyspnea on exertion only with more than usual activity.  Denies orthopnea, PND.  Reports no edema.  She endorses compliance with all of her cardiac medications and reports no side effects.    EKGs/Labs/Other Studies Reviewed:   The following studies were reviewed today:  TTE 01/26/21 1. Left ventricular ejection fraction, by visual estimation, is 45%. The  left ventricle has mildly decreased function. The average left ventricular  global longitudinal strain is -18.5 % (with resoanable trace and only  slight foreshortening) suggestive  of near normal LV function. The left  ventricle demonstrates global  hypokinesis, though anterolateral walls and inferolateral walls  hypokinesis is most prominent. Left ventricular diastolic parameters are  indeterminate in this study.   2. Right ventricular systolic function is normal. The right ventricular  size is normal. Tricuspid regurgitation signal is inadequate for assessing  PA pressure.   3. The mitral valve is grossly normal. Trivial mitral valve  regurgitation. No evidence of mitral stenosis.   4. The aortic valve is tricuspid. Aortic valve regurgitation is not  visualized. No aortic stenosis is present.   TTE 10/15/20: 1. Left ventricular ejection fraction, by estimation, is 30 to 35%. The  left ventricle has moderately decreased function. The left ventricle  demonstrates global hypokinesis. Left ventricular diastolic parameters are  consistent with Grade II diastolic  dysfunction (pseudonormalization). Elevated left atrial pressure. The E/e'  is 22.0.   2. Right ventricular systolic function is normal. The right ventricular  size is normal. Tricuspid regurgitation signal is inadequate for assessing  PA pressure.   3. Left atrial size was mildly dilated.  4. The mitral valve is grossly normal. Mild mitral valve regurgitation.  No evidence of mitral stenosis.   5. The aortic valve is tricuspid. Aortic valve regurgitation is not  visualized. No aortic stenosis is present.   6. The inferior vena cava is normal in size with greater than 50%  respiratory variability, suggesting right atrial pressure of 3 mmHg.    Cardiac Monitor 10/09/20: Patient's monitoring period was from 09/08/20-10/07/20 Predominant rhythm was NSR with frequent PVCs. Average HR 84bpm (ranged from 59-84bpm) There were several runs of NSVT, however, no sustained VT (longest episode was 11 beats) Frequent ventricular ectopy was detected (10%; 314,307 beats) SVE was rare <1% No afib or significant pauses Patient triggered events correlated  with PVCs and NSVT.   TTE 08/14/20:  1. Left ventricular ejection fraction, by estimation, is 20 to 25%. The  left ventricle has severely decreased function. The left ventricle has no  regional wall motion abnormalities. Left ventricular diastolic parameters  are indeterminate.   2. Right ventricular systolic function was not well visualized. The right  ventricular size is not well visualized.   3. Left atrial size was moderately dilated.   4. The mitral valve is grossly normal. Mild mitral valve regurgitation.  There is a second jet of mitral regurgitation seen in the four chamber  view with eccentric pattern, mitral regurgitation may be underestimated in  this study.   5. The aortic valve is tricuspid. Aortic valve regurgitation is not  visualized. No aortic stenosis is present.   6. The inferior vena cava is dilated in size with <50% respiratory  variability, suggesting right atrial pressure of 15 mmHg.    TTE 08/04/20 IMPRESSIONS   1. Left ventricular ejection fraction, by estimation, is 20 to 25%. The  left ventricle has severely decreased function. The left ventricle  demonstrates global hypokinesis. The left ventricular internal cavity size  was mildly to moderately dilated. Left  ventricular diastolic parameters are consistent with Grade II diastolic  dysfunction (pseudonormalization). Elevated left atrial pressure.   2. Right ventricular systolic function is normal. The right ventricular  size is normal. There is mildly elevated pulmonary artery systolic  pressure. The estimated right ventricular systolic pressure is [id number] mmHg.   3. Left atrial size was moderately dilated.   4. The mitral valve is degenerative. Mild mitral valve regurgitation. No  evidence of mitral stenosis.   5. The aortic valve is tricuspid. There is mild calcification of the  aortic valve. There is mild thickening of the aortic valve. Aortic valve  regurgitation is not visualized. Mild aortic valve  sclerosis is present,  with no evidence of aortic valve  stenosis.   6. The inferior vena cava is dilated in size with <50% respiratory  variability, suggesting right atrial pressure of 15 mmHg.   FINDINGS   Left Ventricle: Left ventricular ejection fraction, by estimation, is 20  to 25%. The left ventricle has severely decreased function. The left  ventricle demonstrates global hypokinesis. The left ventricular internal  cavity size was mildly to moderately  dilated. There is no left ventricular hypertrophy. Left ventricular  diastolic parameters are consistent with Grade II diastolic dysfunction  (pseudonormalization). Elevated left atrial pressure.   Right Ventricle: The right ventricular size is normal. No increase in  right ventricular wall thickness. Right ventricular systolic function is  normal. There is mildly elevated pulmonary artery systolic pressure. The  tricuspid regurgitant velocity is 2.39   m/s, and with an assumed right atrial pressure of 15 mmHg,  the estimated  right ventricular systolic pressure is [id number] mmHg.   Left Atrium: Left atrial size was moderately dilated.   Right Atrium: Right atrial size was normal in size.   Pericardium: Trivial pericardial effusion is present.   Mitral Valve: The mitral valve is degenerative in appearance. Mild mitral  annular calcification. Mild mitral valve regurgitation. No evidence of  mitral valve stenosis.   Tricuspid Valve: The tricuspid valve is grossly normal. Tricuspid valve  regurgitation is mild . No evidence of tricuspid stenosis.   Aortic Valve: The aortic valve is tricuspid. There is mild calcification  of the aortic valve. There is mild thickening of the aortic valve. Aortic  valve regurgitation is not visualized. Mild aortic valve sclerosis is  present, with no evidence of aortic  valve stenosis.   Pulmonic Valve: The pulmonic valve was grossly normal. Pulmonic valve  regurgitation is trivial. No evidence of  pulmonic stenosis.   Aorta: The aortic root and ascending aorta are structurally normal, with  no evidence of dilitation.   Venous: The inferior vena cava is dilated in size with less than 50%  respiratory variability, suggesting right atrial pressure of 15 mmHg.   IAS/Shunts: The atrial septum is grossly normal.     RHC/LHC 09/07/20: Conclusions: Mild plaquing of the mid LAD with 10-20% stenosis.  Otherwise, no angiographically significant coronary artery disease. Normal left and right heart filling pressures. Borderline elevated pulmonary artery pressure. Normal Fick cardiac output/index.   Recommendations: Maintain net even fluid balance and optimize goal directed medical therapy for non-ischemic cardiomyopathy. Primary       EKG: No EKG today  Recent Labs: 08/03/2020: TSH 2.701 08/16/2020: Magnesium 2.1 08/19/2020: B Natriuretic Peptide 146.3 08/20/2020: ALT 45 12/11/2020: BUN 25; Creatinine, Ser 1.13; Hemoglobin 13.5; Platelets 255; Potassium 4.2; Sodium 137  Recent Lipid Panel    Component Value Date/Time   TRIG 194 (H) 08/13/2020 1035   Home Medications   Current Meds  Medication Sig   allopurinol (ZYLOPRIM) 100 MG tablet Take 100 mg by mouth daily.   aspirin EC 81 MG tablet Take 81 mg by mouth daily. Swallow whole.   carvedilol (COREG) 3.125 MG tablet Take 1 tablet (3.125 mg total) by mouth 2 (two) times daily.   cholecalciferol (VITAMIN D) 1000 UNITS tablet Take 1,000 Units by mouth daily.   cycloSPORINE (RESTASIS) 0.05 % ophthalmic emulsion Place 1 drop into both eyes 2 (two) times daily.   Dulaglutide (TRULICITY) 1.5 0000000 SOPN Inject 1.5 mg into the skin once a week.   fluticasone (FLONASE) 50 MCG/ACT nasal spray Place 1 spray into both nostrils daily as needed for allergies.   furosemide (LASIX) 20 MG tablet Take 20 mg by mouth as directed. Monday,wed, Friday and sat only.   glipiZIDE-metformin (METAGLIP) 2.5-500 MG tablet Take 1 tablet by mouth 2 (two) times  daily before a meal.   Magnesium Oxide 400 MG CAPS Take 1 capsule (400 mg total) by mouth daily.   montelukast (SINGULAIR) 10 MG tablet Take 10 mg by mouth at bedtime.   Multiple Vitamin (MULTIVITAMIN WITH MINERALS) TABS tablet Take 1 tablet by mouth daily.   NUCYNTA 50 MG tablet Take 1 tablet (50 mg total) by mouth every 6 (six) hours as needed for moderate pain or severe pain.   omeprazole (PRILOSEC) 40 MG capsule Take 40 mg by mouth daily.   potassium chloride (KLOR-CON) 10 MEQ tablet Take 1 tablet (10 mEq total) by mouth daily.   pregabalin (LYRICA) 100 MG capsule Take 1  capsule (100 mg total) by mouth 2 (two) times daily.   rOPINIRole (REQUIP) 3 MG tablet Take 3 mg by mouth at bedtime.   rosuvastatin (CRESTOR) 20 MG tablet Take 1 tablet (20 mg total) by mouth daily.   sacubitril-valsartan (ENTRESTO) 49-51 MG Take 1 tablet by mouth 2 (two) times daily.   spironolactone (ALDACTONE) 25 MG tablet Take 0.5 tablets (12.5 mg total) by mouth daily.   [DISCONTINUED] dapagliflozin propanediol (FARXIGA) 10 MG TABS tablet Take 1 tablet (10 mg total) by mouth daily before breakfast.   [DISCONTINUED] furosemide (LASIX) 20 MG tablet Take one tablet ('20mg'$ ) on Monday, Wednesday and Friday and Sat. (Patient taking differently: Take 20 mg by mouth See admin instructions. Take one tablet ('20mg'$ ) on Monday, Wednesday and Friday and Sat.)     Review of Systems      All other systems reviewed and are otherwise negative except as noted above.  Physical Exam    VS:  BP (!) 102/58 (BP Location: Left Arm, Patient Position: Sitting, Cuff Size: Normal)   Pulse 81   Ht '5\' 4"'$  (1.626 m)   Wt 185 lb 6.4 oz (84.1 kg)   SpO2 98%   BMI 31.82 kg/m  , BMI Body mass index is 31.82 kg/m.  Wt Readings from Last 3 Encounters:  03/17/21 185 lb 6.4 oz (84.1 kg)  01/14/21 185 lb (83.9 kg)  12/02/20 189 lb 12.8 oz (86.1 kg)     GEN: Well nourished, overweight, well developed, in no acute distress. HEENT: normal. Neck:  Supple, no JVD, carotid bruits, or masses. Cardiac: RRR, no murmurs, rubs, or gallops. No clubbing, cyanosis, edema.  Radials/PT 2+ and equal bilaterally.  Respiratory:  Respirations regular and unlabored, clear to auscultation bilaterally. GI: Soft, nontender, nondistended. MS: No deformity or atrophy. Skin: Warm and dry, no rash. Neuro:  Strength and sensation are intact. Psych: Normal affect.  Assessment & Plan    HFrEF/nonischemic cardiomyopathy-echo 12/2020 with LVEF improved to 45%.  No indication for ICD. Grossly euvolemic on exam.  Weight down 3 pounds compared to clinic visit a few months ago.  Stable NYHA II-III symptoms.  GDMT includes Farxiga 10 mg daily, spironolactone 12.5 mg daily, Entresto 49 to 51 mg twice daily, carvedilol 3.125 mg twice daily, furosemide 20 mg 4 times per week.  Unable to further uptitrate GDMT due to low normal blood pressure.  Less than 2 L fluid restriction and low-sodium diet encouraged.  Regular cardiovascular exercise encouraged.  We discussed utilizing pool at the [facility] or chair exercises given her back pain which limits physical activity.  Nonobstructive CAD- Stable with no anginal symptoms. No indication for ischemic evaluation.  GDMT includes Coreg, rosuvastatin, aspirin.  PVC/NSVT-denies palpitations.  Continue carvedilol 3.125 mg twice daily.  CKD- Careful titration of diuretic and antihypertensive.    DM2- 01/26/21 A1c 9.6.  Continue to follow with primary care.  Disposition: Follow up in 4 month(s) with Dr. Johney Frame or APP.  Signed, Loel Dubonnet, NP 03/17/2021, 12:30 PM [facility]

## 2021-03-17 ENCOUNTER — Ambulatory Visit (INDEPENDENT_AMBULATORY_CARE_PROVIDER_SITE_OTHER): Payer: Medicare Other | Admitting: Family

## 2021-03-17 ENCOUNTER — Encounter (HOSPITAL_BASED_OUTPATIENT_CLINIC_OR_DEPARTMENT_OTHER): Payer: Self-pay | Admitting: Family

## 2021-03-17 ENCOUNTER — Other Ambulatory Visit: Payer: Self-pay

## 2021-03-17 VITALS — BP 102/58 | HR 81 | Ht 64.0 in | Wt 185.4 lb

## 2021-03-17 DIAGNOSIS — I472 Ventricular tachycardia: Secondary | ICD-10-CM

## 2021-03-17 DIAGNOSIS — I5020 Unspecified systolic (congestive) heart failure: Secondary | ICD-10-CM

## 2021-03-17 DIAGNOSIS — I4729 Other ventricular tachycardia: Secondary | ICD-10-CM

## 2021-03-17 DIAGNOSIS — E119 Type 2 diabetes mellitus without complications: Secondary | ICD-10-CM | POA: Diagnosis not present

## 2021-03-17 DIAGNOSIS — I428 Other cardiomyopathies: Secondary | ICD-10-CM

## 2021-03-17 DIAGNOSIS — N1831 Chronic kidney disease, stage 3a: Secondary | ICD-10-CM | POA: Diagnosis not present

## 2021-03-17 DIAGNOSIS — I493 Ventricular premature depolarization: Secondary | ICD-10-CM

## 2021-03-17 MED ORDER — DAPAGLIFLOZIN PROPANEDIOL 10 MG PO TABS: 10.0000 mg | ORAL_TABLET | Freq: Every day | ORAL | 3 refills | Status: DC

## 2021-03-17 NOTE — Patient Instructions (Addendum)
Medication Instructions:  Continue your current medications.   *If you need a refill on your cardiac medications before your next appointment, please call your pharmacy*   Lab Work: None ordered today.  If you have labs (blood work) drawn today and your tests are completely normal, you will receive your results only by: [place] (if you have MyChart) OR A paper copy in the mail If you have any lab test that is abnormal or we need to change your treatment, we will call you to review the results.   Testing/Procedures: None ordered today.  Your echocardiogram in June showed your heart pumping function was up to 45%.   Follow-Up: At [facility], you and your health needs are our priority.  As part of our continuing mission to provide you with exceptional heart care, we have created designated Provider Care Teams.  These Care Teams include your primary Cardiologist (physician) and Advanced Practice Providers (APPs -  Physician Assistants and Nurse Practitioners) who all work together to provide you with the care you need, when you need it.  We recommend signing up for the patient portal called "MyChart".  Sign up information is provided on this After Visit Summary.  MyChart is used to connect with patients for Virtual Visits (Telemedicine).  Patients are able to view lab/test results, encounter notes, upcoming appointments, etc.  Non-urgent messages can be sent to your provider as well.   To learn more about what you can do with MyChart, go to [email or web address].    Your next appointment:   4 month(s)  The format for your next appointment:   In Person  Provider:   You may see Freada Bergeron, MD or one of the following Advanced Practice Providers on your designated Care Team:   Richardson Dopp, PA-C Vin Salisbury, [place] Loel Dubonnet, NP    Other Instructions  Heart Healthy Diet Recommendations: A low-salt diet is recommended. Meats should be grilled, baked, or  boiled. Avoid fried foods. Focus on lean protein sources like fish or chicken with vegetables and fruits. The American Heart Association is a Microbiologist!    Exercise recommendations: The American Heart Association recommends 150 minutes of moderate intensity exercise weekly. Try 30 minutes of moderate intensity exercise 4-5 times per week. This could include walking, jogging, or swimming.    Exercises to do While Sitting  Exercises that you do while sitting (chair exercises) can give you many of the same benefits as full exercise. Benefits include strengthening your heart, burning calories, and keeping muscles and joints healthy. Exercise can also improve your mood and help with depression andanxiety. You may benefit from chair exercises if you are unable to do standing exercises because of: Diabetic foot pain. Obesity. Illness. Arthritis. Recovery from surgery or injury. Breathing problems. Balance problems. Another type of disability. Before starting chair exercises, check with your health care provider or a physical therapist to find out how much exercise you can tolerate and which exercises are safe for you. If your health care provider approves: Start out slowly and build up over time. Aim to work up to about 10-20 minutes for each exercise session. Make exercise part of your daily routine. Drink water when you exercise. Do not wait until you are thirsty. Drink every 10-15 minutes. Stop exercising right away if you have pain, nausea, shortness of breath, or dizziness. If you are exercising in a wheelchair, make sure to lock the wheels. Ask your health care provider whether you can do tai chi  or yoga. Many positions in these mind-body exercises can be modified to do while seated. Warm-up Before starting other exercises: Sit up as straight as you can. Have your knees bent at 90 degrees, which is the shape of the capital letter "L." Keep your feet flat on the floor. Sit at the  front edge of your chair, if you can. Pull in (tighten) the muscles in your abdomen and stretch your spine and neck as straight as you can. Hold this position for a few minutes. Breathe in and out evenly. Try to concentrate on your breathing, and relax your mind. Stretching Exercise A: Arm stretch Hold your arms out straight in front of your body. Bend your hands at the wrist with your fingers pointing up, as if signaling someone to stop. Notice the slight tension in your forearms as you hold the position. Keeping your arms out and your hands bent, rotate your hands outward as far as you can and hold this stretch. Aim to have your thumbs pointing up and your pinkie fingers pointing down. Slowly repeat arm stretches for one minute as tolerated. Exercise B: Leg stretch If you can move your legs, try to "draw" letters on the floor with the toes of your foot. Write your name with one foot. Write your name with the toes of your other foot. Slowly repeat the movements for one minute as tolerated. Exercise C: Reach for the sky Reach your hands as far over your head as you can to stretch your spine. Move your hands and arms as if you are climbing a rope. Slowly repeat the movements for one minute as tolerated. Range of motion exercises Exercise A: Shoulder roll Let your arms hang loosely at your sides. Lift just your shoulders up toward your ears, then let them relax back down. When your shoulders feel loose, rotate your shoulders in backward and forward circles. Do shoulder rolls slowly for one minute as tolerated. Exercise B: March in place As if you are marching, pump your arms and lift your legs up and down. Lift your knees as high as you can. If you are unable to lift your knees, just pump your arms and move your ankles and feet up and down. March in place for one minute as tolerated. Exercise C: Seated jumping jacks Let your arms hang down straight. Keeping your arms straight, lift them up  over your head. Aim to point your fingers to the ceiling. While you lift your arms, straighten your legs and slide your heels along the floor to your sides, as wide as you can. As you bring your arms back down to your sides, slide your legs back together. If you are unable to use your legs, just move your arms. Slowly repeat seated jumping jacks for one minute as tolerated. Strengthening exercises Exercise A: Shoulder squeeze Hold your arms straight out from your body to your sides, with your elbows bent and your fists pointed at the ceiling. Keeping your arms in the bent position, move them forward so your elbows and forearms meet in front of your face. Open your arms back out as wide as you can with your elbows still bent, until you feel your shoulder blades squeezing together. Hold for 5 seconds. Slowly repeat the movements forward and backward for one minute as tolerated. Contact a health care provider if you: Had to stop exercising due to any of the following: Pain. Nausea. Shortness of breath. Dizziness. Fatigue. Have significant pain or soreness after exercising. Get help  right away if you have: Chest pain. Difficulty breathing. These symptoms may represent a serious problem that is an emergency. Do not wait to see if the symptoms will go away. Get medical help right away. Call your local emergency services (911 in the [place] Do not drive yourself to the hospital. This information is not intended to replace advice given to you by your health care provider. Make sure you discuss any questions you have with your healthcare provider. Document Revised: 10/28/2019 Document Reviewed: 11/14/2019 Elsevier Patient Education  2022 [employer].

## 2021-03-22 ENCOUNTER — Telehealth: Payer: Self-pay

## 2021-03-22 NOTE — Telephone Encounter (Signed)
  Encourage patient to contact the pharmacy for refills or they can request refills through [place]: 01/14/21   NEXT APPOINTMENT DATE: 04/16/21   MEDICATION: omeprazole (PRILOSEC) 40 MG capsule  Is the patient out of medication? No  PHARMACY: [id number] [place], [place] - [address] Let patient know to contact pharmacy at the end of the day to make sure medication is ready.  Please notify patient to allow 48-72 hours to process

## 2021-03-23 ENCOUNTER — Other Ambulatory Visit: Payer: Self-pay

## 2021-03-23 MED ORDER — OMEPRAZOLE 40 MG PO CPDR
40.0000 mg | DELAYED_RELEASE_CAPSULE | Freq: Every day | ORAL | 3 refills | Status: DC
Start: 2021-03-23 — End: 2022-03-04

## 2021-03-23 NOTE — Telephone Encounter (Signed)
Refill sent to pharmacy.   

## 2021-04-16 ENCOUNTER — Other Ambulatory Visit: Payer: Self-pay

## 2021-04-16 ENCOUNTER — Ambulatory Visit (INDEPENDENT_AMBULATORY_CARE_PROVIDER_SITE_OTHER): Payer: Medicare Other | Admitting: Family Medicine

## 2021-04-16 ENCOUNTER — Encounter: Payer: Self-pay | Admitting: Family Medicine

## 2021-04-16 VITALS — BP 130/88 | HR 73 | Temp 97.9°F | Ht 64.0 in | Wt 187.8 lb

## 2021-04-16 DIAGNOSIS — IMO0002 Reserved for concepts with insufficient information to code with codable children: Secondary | ICD-10-CM

## 2021-04-16 DIAGNOSIS — E1165 Type 2 diabetes mellitus with hyperglycemia: Secondary | ICD-10-CM | POA: Diagnosis not present

## 2021-04-16 DIAGNOSIS — E118 Type 2 diabetes mellitus with unspecified complications: Secondary | ICD-10-CM

## 2021-04-16 DIAGNOSIS — Z23 Encounter for immunization: Secondary | ICD-10-CM | POA: Diagnosis not present

## 2021-04-16 LAB — POCT GLYCOSYLATED HEMOGLOBIN (HGB A1C): Hemoglobin A1C: 10.5 % — AB (ref 4.0–5.6)

## 2021-04-16 MED ORDER — LANTUS SOLOSTAR 100 UNIT/ML ~~LOC~~ SOPN: 15.0000 [IU] | PEN_INJECTOR | Freq: Every day | SUBCUTANEOUS | 1 refills | Status: DC

## 2021-04-16 NOTE — Progress Notes (Signed)
Subjective  CC:  Chief Complaint  Patient presents with   Diabetes   Gastroesophageal Reflux   Hypertension    HPI: [name] is a 75 y.o. female who presents to the office today for follow up of diabetes and problems listed above in the chief complaint.  Diabetes follow up: Her diabetic control is reported as Worse. She is doing fairly well with diet however sugars remains high. Tolerating the lower dose metformin. No more lows. Fastings are around 200.  She denies exertional CP or SOB or symptomatic hypoglycemia. She denies foot sores or paresthesias.  Wt Readings from Last 3 Encounters:  04/16/21 187 lb 12.8 oz (85.2 kg)  03/17/21 185 lb 6.4 oz (84.1 kg)  01/14/21 185 lb (83.9 kg)    BP Readings from Last 3 Encounters:  04/16/21 130/88  03/17/21 (!) 102/58  01/14/21 136/80    Assessment  1. Uncontrolled diabetes mellitus with complication, without long-term current use of insulin ([place])   2. Need for immunization against influenza      Plan  Diabetes is currently poorly controlled. Will add lantus at night. Education on injections, risks/benefits and effects. Discussed in detail. Continue bid home glucose monitoring. Need to get fastings down. Continue trulicity 1.'5mg'$  weekly and glipimet 2.5/500 bid. Monitor diet closely. Will check back with fastings in 6 weeks.flu shot updated today I spent a total of 30 minutes for this patient encounter. Time spent included preparation, face-to-face counseling with the patient and coordination of care, review of chart and records, and documentation of the encounter.   Follow up: 6 weeks. Orders Placed This Encounter  Procedures   Flu Vaccine QUAD High Dose(Fluad)   POCT HgB A1C   Meds ordered this encounter  Medications   insulin glargine (LANTUS SOLOSTAR) 100 UNIT/ML Solostar Pen    Sig: Inject 15 Units into the skin at bedtime.    Dispense:  15 mL    Refill:  1    Please dispense solostar pens w/ needles       Immunization History  Administered Date(s) Administered   Fluad Quad(high Dose 65+) 07/01/2020, 04/16/2021   Influenza Split 06/01/2010   Influenza, High Dose Seasonal PF 05/13/2014, 04/24/2015, 04/12/2017, 04/19/2018, 04/24/2019, 06/16/2020   Influenza, Seasonal, Injecte, Preservative Fre 04/09/2013, 05/03/2016   Influenza,inj,Quad PF,6+ Mos 05/01/2017   Influenza-Unspecified 04/03/2012   Moderna Sars-Covid-2 Vaccination 01/15/2021, 02/12/2021   Pneumococcal Conjugate-13 05/13/2014   Pneumococcal Polysaccharide-23 02/15/2011   Td 08/01/1998   Tdap 05/01/2004   Zoster Recombinat (Shingrix) 04/24/2019, 08/13/2019   Zoster, Live 02/15/2011    Diabetes Related Lab Review: Lab Results  Component Value Date   HGBA1C 10.5 (A) 04/16/2021   HGBA1C 9.6 (A) 01/14/2021   HGBA1C 9.1 (H) 12/11/2020    No results found for: [facility] Lab Results  Component Value Date   CREATININE 1.13 (H) 12/11/2020   BUN 25 (H) 12/11/2020   NA 137 12/11/2020   K 4.2 12/11/2020   CL 103 12/11/2020   CO2 24 12/11/2020   No results found for: CHOL No results found for: HDL No results found for: [facility] Lab Results  Component Value Date   TRIG 194 (H) 08/13/2020   TRIG 357 (H) 08/12/2020   TRIG 260 (H) 08/10/2020   No results found for: CHOLHDL No results found for: LDLDIRECT The ASCVD Risk score (Arnett DK, et al., 2019) failed to calculate for the following reasons:   Cannot find a previous HDL lab I have reviewed the [place], Fam and  Soc history. 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 ([place]) 08/24/2020   Primary osteoarthritis of left shoulder 07/29/2020   Allergy to statin medication 06/16/2020   DDD (degenerative disc disease), cervical  04/30/2020   Statin intolerance 08/22/2019   Osteoarthritis of left knee 11/05/2018   Chronic pansinusitis 10/04/2017   Chronic low back pain [id number]   Complication of surgical procedure 09/05/2017   Degenerative lumbar disc 06/04/2015   S/P lumbar laminectomy 06/04/2015   Restless leg syndrome 05/13/2014   Controlled type 2 diabetes mellitus without complication, without long-term current use of insulin ([place]) 04/09/2013    Formatting of this note might be different from the original. New onset 04/2013, doesn't tolerate '1000mg'$  metformin due to GI upset.    Spinal stenosis of lumbar region without neurogenic claudication 09/12/2012    Formatting of this note might be different from the original. Right L4/5    GERD (gastroesophageal reflux disease) 05/25/2011    Chronic recurrent heartburn symptoms for 3 or 4 years without weight loss, bleeding or dysphagia. Bonds well to daily PPI.Gatha Mayer, MD, [facility]     Hemorrhoids, internal, with bleeding 05/25/2011    Visible on 2005 colonoscopy and endoscopy 05/25/2011.    Obesity 05/25/2011   Hypertension associated with diabetes ([place]) 11/08/2010   Chronic allergic rhinitis 08/19/2010   Mitral valve prolapse 10/10/2008    Formatting of this note might be different from the original. 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 She has never used smokeless tobacco. She reports that she does not drink alcohol and does not use drugs.  Review of Systems: Ophthalmic: negative for eye pain, loss of vision or double vision Cardiovascular: negative for chest pain Respiratory: negative for SOB or persistent cough Gastrointestinal: negative for abdominal pain Genitourinary: negative for dysuria or gross hematuria MSK: negative for foot lesions Neurologic: negative for weakness or gait disturbance  Objective  Vitals: BP  130/88   Pulse 73   Temp 97.9 F (36.6 C) (Temporal)   Ht '5\' 4"'$  (1.626 m)   Wt 187 lb 12.8 oz (85.2 kg)   SpO2 98%   BMI 32.24 kg/m  General: well appearing, no acute distress  Psych:  Alert and oriented, normal mood and affect     Diabetic education: ongoing education regarding chronic disease management for diabetes was given today. We continue to reinforce the ABC's of diabetic management: A1c (<7 or 8 dependent upon patient), tight blood pressure control, and cholesterol management with goal LDL < 100 minimally. We discuss diet strategies, exercise recommendations, medication options and possible side effects. At each visit, we review recommended immunizations and preventive care recommendations for diabetics and stress that good diabetic control can prevent other problems. See below for this patient's data.   Commons side effects, risks, benefits, and alternatives for medications and treatment plan prescribed today were discussed, and the patient expressed understanding of the given instructions. Patient is instructed to call or message via MyChart if he/she has any questions or concerns regarding our treatment plan. No barriers to understanding were identified. We discussed Red Flag symptoms and signs in detail. Patient expressed understanding regarding what to do in case of urgent  or emergency type symptoms.  Medication list was reconciled, printed and provided to the patient in AVS. Patient instructions and summary information was reviewed with the patient as documented in the AVS. This note was prepared with assistance of Dragon voice recognition software. Occasional wrong-word or sound-a-like substitutions may have occurred due to the inherent limitations of voice recognition software  This visit occurred during the SARS-CoV-2 public health emergency.  Safety protocols were in place, including screening questions prior to the visit, additional usage of staff PPE, and extensive  cleaning of exam room while observing appropriate contact time as indicated for disinfecting solutions.

## 2021-04-16 NOTE — Patient Instructions (Signed)
Please return in 6 weeks to recheck diabetes.  Please keep checking sugars twice a day: every morning and 2 hours after lunch or dinner. Bring in the log to your next appointment.   We are adding nighttime insulin.   If you have any questions or concerns, please don't hesitate to send me a message via MyChart or call the office at [phone]. Thank you for visiting with [place] today! It's our pleasure caring for you.

## 2021-04-23 ENCOUNTER — Other Ambulatory Visit: Payer: Self-pay

## 2021-04-23 ENCOUNTER — Ambulatory Visit (INDEPENDENT_AMBULATORY_CARE_PROVIDER_SITE_OTHER): Payer: Medicare Other

## 2021-04-23 DIAGNOSIS — Z0000 Encounter for general adult medical examination without abnormal findings: Secondary | ICD-10-CM

## 2021-04-23 NOTE — Patient Instructions (Signed)
[name] , Thank you for taking time to come for your Medicare Wellness Visit. I appreciate your ongoing commitment to your health goals. Please review the following plan we discussed and let me know if I can assist you in the future.   Screening recommendations/referrals: Colonoscopy: Done 08/14/14 repeat every 10 years due 08/14/24 Mammogram: Done 09/17/20 repeat every year Bone Density: Done 02/11/21 repeat every 2 years  Recommended yearly ophthalmology/optometry visit for glaucoma screening and checkup Recommended yearly dental visit for hygiene and checkup  Vaccinations: Influenza vaccine: Done 04/16/21 repeat every year  Pneumococcal vaccine: Completed  Tdap vaccine: Due and discussed Shingles vaccine: Completed 04/24/19 & 08/13/19   Covid-19:Completed 6/17 & 02/12/21  Advanced directives: Advance directive discussed with you today. I have provided a copy for you to complete at home and have notarized. Once this is complete please bring a copy in to our office so we can scan it into your chart.  Conditions/risks identified: get more energy   Next appointment: Follow up in one year for your annual wellness visit    Preventive Care 75 Years and Older, Female Preventive care refers to lifestyle choices and visits with your health care provider that can promote health and wellness. What does preventive care include? A yearly physical exam. This is also called an annual well check. Dental exams once or twice a year. Routine eye exams. Ask your health care provider how often you should have your eyes checked. Personal lifestyle choices, including: Daily care of your teeth and gums. Regular physical activity. Eating a healthy diet. Avoiding tobacco and drug use. Limiting alcohol use. Practicing safe sex. Taking low-dose aspirin every day. Taking vitamin and mineral supplements as recommended by your health care provider. What happens during an annual well check? The services and  screenings done by your health care provider during your annual well check will depend on your age, overall health, lifestyle risk factors, and family history of disease. Counseling  Your health care provider may ask you questions about your: Alcohol use. Tobacco use. Drug use. Emotional well-being. Home and relationship well-being. Sexual activity. Eating habits. History of falls. Memory and ability to understand (cognition). Work and work Statistician. Reproductive health. Screening  You may have the following tests or measurements: Height, weight, and BMI. Blood pressure. Lipid and cholesterol levels. These may be checked every 5 years, or more frequently if you are over 75 years old. Skin check. Lung cancer screening. You may have this screening every year starting at age 75 if you have a 30-pack-year history of smoking and currently smoke or have quit within the past 15 years. Fecal occult blood test (FOBT) of the stool. You may have this test every year starting at age 75. Flexible sigmoidoscopy or colonoscopy. You may have a sigmoidoscopy every 5 years or a colonoscopy every 10 years starting at age 75. Hepatitis C blood test. Hepatitis B blood test. Sexually transmitted disease (STD) testing. Diabetes screening. This is done by checking your blood sugar (glucose) after you have not eaten for a while (fasting). You may have this done every 1-3 years. Bone density scan. This is done to screen for osteoporosis. You may have this done starting at age 75. Mammogram. This may be done every 75 years. Talk to your health care provider about how often you should have regular mammograms. Talk with your health care provider about your test results, treatment options, and if necessary, the need for more tests. Vaccines  Your health care provider may  recommend certain vaccines, such as: Influenza vaccine. This is recommended every year. Tetanus, diphtheria, and acellular pertussis (Tdap,  Td) vaccine. You may need a Td booster every 10 years. Zoster vaccine. You may need this after age 75. Pneumococcal 13-valent conjugate (PCV13) vaccine. One dose is recommended after age 75. Pneumococcal polysaccharide (PPSV23) vaccine. One dose is recommended after age 75. Talk to your health care provider about which screenings and vaccines you need and how often you need them. This information is not intended to replace advice given to you by your health care provider. Make sure you discuss any questions you have with your health care provider. Document Released: 08/14/2015 Document Revised: 04/06/2016 Document Reviewed: 05/19/2015 Elsevier Interactive Patient Education  2017 [place] Prevention in the Home Falls can cause injuries. They can happen to people of all ages. There are many things you can do to make your home safe and to help prevent falls. What can I do on the outside of my home? Regularly fix the edges of walkways and driveways and fix any cracks. Remove anything that might make you trip as you walk through a door, such as a raised step or threshold. Trim any bushes or trees on the path to your home. Use bright outdoor lighting. Clear any walking paths of anything that might make someone trip, such as rocks or tools. Regularly check to see if handrails are loose or broken. Make sure that both sides of any steps have handrails. Any raised decks and porches should have guardrails on the edges. Have any leaves, snow, or ice cleared regularly. Use sand or salt on walking paths during winter. Clean up any spills in your garage right away. This includes oil or grease spills. What can I do in the bathroom? Use night lights. Install grab bars by the toilet and in the tub and shower. Do not use towel bars as grab bars. Use non-skid mats or decals in the tub or shower. If you need to sit down in the shower, use a plastic, non-slip stool. Keep the floor dry. Clean up any  water that spills on the floor as soon as it happens. Remove soap buildup in the tub or shower regularly. Attach bath mats securely with double-sided non-slip rug tape. Do not have throw rugs and other things on the floor that can make you trip. What can I do in the bedroom? Use night lights. Make sure that you have a light by your bed that is easy to reach. Do not use any sheets or blankets that are too big for your bed. They should not hang down onto the floor. Have a firm chair that has side arms. You can use this for support while you get dressed. Do not have throw rugs and other things on the floor that can make you trip. What can I do in the kitchen? Clean up any spills right away. Avoid walking on wet floors. Keep items that you use a lot in easy-to-reach places. If you need to reach something above you, use a strong step stool that has a grab bar. Keep electrical cords out of the way. Do not use floor polish or wax that makes floors slippery. If you must use wax, use non-skid floor wax. Do not have throw rugs and other things on the floor that can make you trip. What can I do with my stairs? Do not leave any items on the stairs. Make sure that there are handrails on both sides of  the stairs and use them. Fix handrails that are broken or loose. Make sure that handrails are as long as the stairways. Check any carpeting to make sure that it is firmly attached to the stairs. Fix any carpet that is loose or worn. Avoid having throw rugs at the top or bottom of the stairs. If you do have throw rugs, attach them to the floor with carpet tape. Make sure that you have a light switch at the top of the stairs and the bottom of the stairs. If you do not have them, ask someone to add them for you. What else can I do to help prevent falls? Wear shoes that: Do not have high heels. Have rubber bottoms. Are comfortable and fit you well. Are closed at the toe. Do not wear sandals. If you use a  stepladder: Make sure that it is fully opened. Do not climb a closed stepladder. Make sure that both sides of the stepladder are locked into place. Ask someone to hold it for you, if possible. Clearly mark and make sure that you can see: Any grab bars or handrails. First and last steps. Where the edge of each step is. Use tools that help you move around (mobility aids) if they are needed. These include: Canes. Walkers. Scooters. Crutches. Turn on the lights when you go into a dark area. Replace any light bulbs as soon as they burn out. Set up your furniture so you have a clear path. Avoid moving your furniture around. If any of your floors are uneven, fix them. If there are any pets around you, be aware of where they are. Review your medicines with your doctor. Some medicines can make you feel dizzy. This can increase your chance of falling. Ask your doctor what other things that you can do to help prevent falls. This information is not intended to replace advice given to you by your health care provider. Make sure you discuss any questions you have with your health care provider. Document Released: 05/14/2009 Document Revised: 12/24/2015 Document Reviewed: 08/22/2014 Elsevier Interactive Patient Education  2017 [employer].

## 2021-04-23 NOTE — Progress Notes (Addendum)
Virtual Visit via Telephone Note  I connected with  [name] on 04/23/21 at  2:30 PM EDT by telephone and verified that I am speaking with the correct person using two identifiers.  Medicare Annual Wellness visit completed telephonically due to Covid-19 pandemic.   Persons participating in this call: This Health Coach and this patient.   Location: Patient: Home Provider: Office   I discussed the limitations, risks, security and privacy concerns of performing an evaluation and management service by telephone and the availability of in person appointments. The patient expressed understanding and agreed to proceed.  Unable to perform video visit due to video visit attempted and failed and/or patient does not have video capability.   Some vital signs may be absent or patient reported.   Willette Brace, LPN   Subjective:   [name] is a 75 y.o. female who presents for an Initial Medicare Annual Wellness Visit.  Review of Systems     Cardiac Risk Factors include: advanced age (>31men, >74 women);hypertension;diabetes mellitus;dyslipidemia;obesity (BMI >30kg/m2)     Objective:    Today's Vitals   04/23/21 1433  PainSc: 6    There is no height or weight on file to calculate BMI.  Advanced Directives 04/23/2021 12/14/2020 12/11/2020 09/07/2020 08/13/2020 08/03/2020 08/03/2020  Does Patient Have a Medical Advance Directive? No No No No No No No  Would patient like information on creating a medical advance directive? Yes (ED - Information included in AVS) No - Patient declined - No - Patient declined No - Patient declined No - Patient declined -    Current Medications (verified) Outpatient Encounter Medications as of 04/23/2021  Medication Sig   allopurinol (ZYLOPRIM) 100 MG tablet Take 100 mg by mouth daily.   aspirin EC 81 MG tablet Take 81 mg by mouth daily. Swallow whole.   cholecalciferol (VITAMIN D) 1000 UNITS tablet Take 1,000 Units by mouth daily.   cycloSPORINE  (RESTASIS) 0.05 % ophthalmic emulsion Place 1 drop into both eyes 2 (two) times daily.   dapagliflozin propanediol (FARXIGA) 10 MG TABS tablet Take 1 tablet (10 mg total) by mouth daily before breakfast.   Dulaglutide (TRULICITY) 1.5 IP/3.8SN SOPN Inject 1.5 mg into the skin once a week.   furosemide (LASIX) 20 MG tablet Take 20 mg by mouth as directed. Monday,wed, Friday and sat only.   glipiZIDE-metformin (METAGLIP) 2.5-500 MG tablet Take 1 tablet by mouth 2 (two) times daily before a meal.   insulin glargine (LANTUS SOLOSTAR) 100 UNIT/ML Solostar Pen Inject 15 Units into the skin at bedtime.   Magnesium Oxide 400 MG CAPS Take 1 capsule (400 mg total) by mouth daily.   montelukast (SINGULAIR) 10 MG tablet Take 10 mg by mouth at bedtime.   montelukast (SINGULAIR) 10 MG tablet Take 1 tablet by mouth daily.   Multiple Vitamin (MULTIVITAMIN WITH MINERALS) TABS tablet Take 1 tablet by mouth daily.   omeprazole (PRILOSEC) 40 MG capsule Take 1 capsule (40 mg total) by mouth daily.   pregabalin (LYRICA) 100 MG capsule Take 1 capsule (100 mg total) by mouth 2 (two) times daily.   rOPINIRole (REQUIP) 3 MG tablet Take 3 mg by mouth at bedtime.   rosuvastatin (CRESTOR) 20 MG tablet Take 1 tablet (20 mg total) by mouth daily.   sacubitril-valsartan (ENTRESTO) 49-51 MG Take 1 tablet by mouth 2 (two) times daily.   spironolactone (ALDACTONE) 25 MG tablet Take 0.5 tablets (12.5 mg total) by mouth daily.   carvedilol (COREG) 3.125 MG  tablet Take 1 tablet (3.125 mg total) by mouth 2 (two) times daily.   potassium chloride (KLOR-CON) 10 MEQ tablet Take 1 tablet (10 mEq total) by mouth daily.   [DISCONTINUED] fluticasone (FLONASE) 50 MCG/ACT nasal spray Place 1 spray into both nostrils daily as needed for allergies. (Patient not taking: Reported on 04/23/2021)   [DISCONTINUED] NUCYNTA 50 MG tablet Take 1 tablet (50 mg total) by mouth every 6 (six) hours as needed for moderate pain or severe pain. (Patient not  taking: Reported on 04/23/2021)   No facility-administered encounter medications on file as of 04/23/2021.    Allergies (verified) Amlodipine, Depo-medrol [methylprednisolone], Gabapentin, Levothyroxine, Sitagliptin, Ace inhibitors, Betadine [povidone iodine], Codeine, Cortizone-10 [hydrocortisone], Tetracyclines & related, and Dexamethasone   History: Past Medical History:  Diagnosis Date   Arthritis    Chronic back pain    buldging disc,scoliosis,arthritis   Congestive heart failure with left ventricular systolic dysfunction ([place]) 08/24/2020   Diabetes mellitus without complication ([place])    takes Trulicity,Jardiance,and Metformin daily.Average fasting blood sugar runs around130   GERD (gastroesophageal reflux disease)    takes Omeprazole daily   History of bronchitis > 8 yrs ago   History of shingles    HTN (hypertension)    takes Amlodipine and Micardis daily   Hx of colonic polyps    benign   Internal and external hemorrhoids without complication    Joint pain    Mitral valve prolapse    Nocturia    PONV (postoperative nausea and vomiting)    when gets injections in joints gets hives.Betadine rash   Restless leg syndrome    takes Requip at bedtime   Seasonal allergies    takes Claritin daily as needed   Urinary frequency    Uterine fibroid    Past Surgical History:  Procedure Laterality Date   BUNIONECTOMY Bilateral    COLONOSCOPY  07/12/2004   diverticulosis, internal and external hemorrhoids   COLONOSCOPY     FINGER ARTHROSCOPY WITH CARPOMETACARPEL ([place]) ARTHROPLASTY Right 09/28/2015   Procedure: RIGHT THUMB TRAPEZIUM EXCISION WITH CARPOMETACARPEL ([place]) ARTHROPLASTY AND TENDON TRANSFER;  Surgeon: Iran Planas, MD;  Location: [facility];  Service: Orthopedics;  Laterality: Right;   HEMORRHOID SURGERY     almost 40 yrs ago   HYSTEROSCOPY WITH D & C  08/23/2000   and resectoscopic myomectomy   LUMBAR DISC SURGERY  03/16/2005   LUMBAR EPIDURAL INJECTION     PLANTAR FASCIA  RELEASE Left 02/03/2010   and torn tendon   REVERSE SHOULDER ARTHROPLASTY Right 12/14/2020   Procedure: REVERSE SHOULDER ARTHROPLASTY;  Surgeon: Netta Cedars, MD;  Location: [facility];  Service: Orthopedics;  Laterality: Right;   RIGHT/LEFT HEART CATH AND CORONARY ANGIOGRAPHY N/A 09/07/2020   Procedure: RIGHT/LEFT HEART CATH AND CORONARY ANGIOGRAPHY;  Surgeon: Nelva Bush, MD;  Location: [facility];  Service: Cardiovascular;  Laterality: N/A;   TENDON TRANSFER Right 09/28/2015   Procedure: TENDON TRANSFER;  Surgeon: Iran Planas, MD;  Location: [facility];  Service: Orthopedics;  Laterality: Right;   TONSILLECTOMY     TUBAL LIGATION     WRIST SURGERY     left, removal of cyst   Family History  Problem Relation Age of Onset   Pancreatic cancer Father    Colon cancer Neg Hx    Breast cancer Neg Hx    Social History   Socioeconomic History   Marital status: Married    Spouse name: Not on file   Number of children: 3   Years  of education: Not on file   Highest education level: Not on file  Occupational History   Occupation: Retired   Tobacco Use   Smoking status: Never   Smokeless tobacco: Never  Vaping Use   Vaping Use: Never used  Substance and Sexual Activity   Alcohol use: No    Alcohol/week: 0.0 standard drinks   Drug use: No   Sexual activity: Yes  Other Topics Concern   Not on file  Social History Narrative   1 caffeine drinks daily         Social Determinants of Health   Financial Resource Strain: Low Risk    Difficulty of Paying Living Expenses: Not hard at all  Food Insecurity: No Food Insecurity   Worried About Charity fundraiser in the Last Year: Never true   [place] in the Last Year: Never true  Transportation Needs: No Transportation Needs   Lack of Transportation (Medical): No   Lack of Transportation (Non-Medical): No  Physical Activity: Inactive   Days of Exercise per Week: 0 days   Minutes of Exercise per Session: 0 min  Stress: No  Stress Concern Present   Feeling of Stress : Not at all  Social Connections: Moderately Integrated   Frequency of Communication with Friends and Family: Once a week   Frequency of Social Gatherings with Friends and Family: Twice a week   Attends Religious Services: More than 4 times per year   Active Member of [employer] or Organizations: No   Attends Music therapist: Never   Marital Status: Married    Tobacco Counseling Counseling given: Not Answered   Clinical Intake:  Pre-visit preparation completed: Yes  Pain : 0-10 (back) Pain Score: 6  Pain Type: Chronic pain Pain Location: Back Pain Descriptors / Indicators: Other (Comment) (hurts) Pain Frequency: Intermittent     BMI - recorded: 32.24 Nutritional Status: BMI > 30  Obese Nutritional Risks: None Diabetes: Yes CBG done?: Yes (114) CBG resulted in Enter/ Edit results?: No Did pt. bring in CBG monitor from home?: No  How often do you need to have someone help you when you read instructions, pamphlets, or other written materials from your doctor or pharmacy?: 1 - Never  Diabetic?Nutrition Risk Assessment:  Has the patient had any N/V/D within the last 2 months?  No  Does the patient have any non-healing wounds?  No  Has the patient had any unintentional weight loss or weight gain?  No   Diabetes:  Is the patient diabetic?  Yes  If diabetic, was a CBG obtained today?  Yes  Did the patient bring in their glucometer from home?  No  How often do you monitor your CBG's? Daily .   Financial Strains and Diabetes Management:  Are you having any financial strains with the device, your supplies or your medication? No .  Does the patient want to be seen by Chronic Care Management for management of their diabetes?  No  Would the patient like to be referred to a Nutritionist or for Diabetic Management?  No   Diabetic Exams:  Diabetic Eye Exam: Completed 10/13/20 Diabetic Foot Exam: Completed  08/24/20   Interpreter Needed?: No  Information entered by :: Charlott Rakes, LPN   Activities of Daily Living In your present state of health, do you have any difficulty performing the following activities: 04/23/2021 12/14/2020  Hearing? N -  Vision? N -  Difficulty concentrating or making decisions? N -  Walking or  climbing stairs? N -  Dressing or bathing? N -  Doing errands, shopping? N N  Preparing Food and eating ? N -  Using the Toilet? N -  In the past six months, have you accidently leaked urine? N -  Do you have problems with loss of bowel control? N -  Managing your Medications? N -  Managing your Finances? N -  Housekeeping or managing your Housekeeping? N -  Some recent data might be hidden    Patient Care Team: Leamon Arnt, MD as PCP - General (Family Medicine) Freada Bergeron, MD as PCP - Cardiology (Cardiology) Rozetta Nunnery, MD as Consulting Physician (Otolaryngology) Netta Cedars, MD as Consulting Physician (Orthopedic Surgery)  Indicate any recent Medical Services you may have received from other than [facility] providers in the past year (date may be approximate).     Assessment:   This is a routine wellness examination for [facility].  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 0  Number falls in past yr: 0  Injury with Fall? 0  Risk for fall due to : Impaired vision  Follow up Falls prevention discussed     FALL RISK PREVENTION PERTAINING TO THE HOME:  Any stairs in or around the home? Yes  If so, are there any without handrails? No  Home free of loose throw rugs in walkways, pet beds, electrical cords, etc? Yes  Adequate lighting in your home to reduce risk of falls? Yes   ASSISTIVE DEVICES UTILIZED TO PREVENT FALLS:  Life alert? No  Use of a cane, walker or w/c? Yes  Grab bars in the bathroom? Yes  Shower chair or bench in shower? No  Elevated toilet seat or a handicapped toilet? No   TIMED UP AND GO:  Was the test performed? No .   Cognitive Function:     6CIT Screen 04/23/2021  What Year? 0 points  What month? 0 points  What time? 0 points  Count back from 20 0 points  Months in reverse 0 points  Repeat phrase 0 points  Total Score 0    Immunizations Immunization History  Administered Date(s) Administered   Fluad Quad(high Dose 65+) 07/01/2020, 04/16/2021   Influenza Split 06/01/2010   Influenza, High Dose Seasonal PF 05/13/2014, 04/24/2015, 04/12/2017, 04/19/2018, 04/24/2019, 06/16/2020   Influenza, Seasonal, Injecte, Preservative Fre 04/09/2013, 05/03/2016   Influenza,inj,Quad PF,6+ Mos 05/01/2017   Influenza-Unspecified 04/03/2012   Moderna Sars-Covid-2 Vaccination 01/15/2021, 02/12/2021   Pneumococcal Conjugate-13 05/13/2014   Pneumococcal Polysaccharide-23 02/15/2011   Td 08/01/1998   Tdap 05/01/2004   Zoster Recombinat (Shingrix) 04/24/2019, 08/13/2019   Zoster, Live 02/15/2011    TDAP status: Due, Education has been provided regarding the importance of this vaccine. Advised may receive this vaccine at local pharmacy or Health Dept. Aware to provide a copy of the vaccination  record if obtained from local pharmacy or Health Dept. Verbalized acceptance and understanding.  Flu Vaccine status: Up to date  Pneumococcal vaccine status: Up to date  Covid-19 vaccine status: Completed vaccines  Qualifies for Shingles Vaccine? Yes   Zostavax completed Yes    Shingrix Completed?: Yes  Screening Tests Health Maintenance  Topic Date Due   TETANUS/TDAP  05/01/2014   COVID-19 Vaccine (3 - Moderna risk series) 03/12/2021   FOOT EXAM  08/24/2021   MAMMOGRAM  09/17/2021   OPHTHALMOLOGY EXAM  10/13/2021   HEMOGLOBIN A1C  10/14/2021   DEXA SCAN  02/12/2023   COLONOSCOPY (Pts 45-21yrs Insurance coverage will need to be confirmed)  08/14/2024   INFLUENZA VACCINE  Completed   Zoster Vaccines- Shingrix  Completed   HPV VACCINES  Aged Out   Hepatitis C Screening  Discontinued    Health Maintenance  Health Maintenance Due  Topic Date Due   TETANUS/TDAP  05/01/2014   COVID-19 Vaccine (3 - Moderna risk series) 03/12/2021    Colorectal cancer screening: Type of screening: Colonoscopy. Completed 08/14/14. Repeat every 10 years  Mammogram status: Completed 2//17/22. Repeat every year  Bone Density status: Completed 02/11/21. Results reflect: Bone density results: NORMAL. Repeat every 2 years.  Additional Screening:  Hepatitis C Screening: does not qualify;  Vision Screening: Recommended annual ophthalmology exams for early detection of glaucoma and other disorders of the eye. Is the patient up to date with their annual eye exam?  Yes  Who is the provider or what is the name of the office in which the patient attends annual eye exams? [facility] [place]  If pt is not established with a provider, would they like to be referred to a provider to establish care? No .   Dental Screening: Recommended annual dental exams for proper oral hygiene  Community Resource Referral / Chronic Care Management: CRR required this visit?  No   CCM required this visit?  No      Plan:     I have personally reviewed and noted the following in the patient's chart:   Medical and social history Use of alcohol, tobacco or illicit drugs  Current medications and supplements including opioid prescriptions. Patient is not currently taking opioid  prescriptions. Functional ability and status Nutritional status Physical activity Advanced directives List of other physicians Hospitalizations, surgeries, and ER visits in previous 12 months Vitals Screenings to include cognitive, depression, and falls Referrals and appointments  In addition, I have reviewed and discussed with patient certain preventive protocols, quality metrics, and best practice recommendations. A written personalized care plan for preventive services as well as general preventive health recommendations were provided to patient.     Willette Brace, LPN   1/61/0960   Nurse Notes: None

## 2021-04-28 ENCOUNTER — Other Ambulatory Visit: Payer: Self-pay | Admitting: Family Medicine

## 2021-06-09 ENCOUNTER — Encounter: Payer: Self-pay | Admitting: Family Medicine

## 2021-06-09 ENCOUNTER — Other Ambulatory Visit: Payer: Self-pay

## 2021-06-09 ENCOUNTER — Ambulatory Visit: Payer: Medicare Other | Admitting: Family Medicine

## 2021-06-09 VITALS — BP 126/70 | HR 70 | Temp 98.0°F | Ht 64.0 in | Wt 191.4 lb

## 2021-06-09 DIAGNOSIS — E1159 Type 2 diabetes mellitus with other circulatory complications: Secondary | ICD-10-CM

## 2021-06-09 DIAGNOSIS — I5020 Unspecified systolic (congestive) heart failure: Secondary | ICD-10-CM

## 2021-06-09 DIAGNOSIS — I152 Hypertension secondary to endocrine disorders: Secondary | ICD-10-CM

## 2021-06-09 DIAGNOSIS — E1165 Type 2 diabetes mellitus with hyperglycemia: Secondary | ICD-10-CM | POA: Diagnosis not present

## 2021-06-09 DIAGNOSIS — I428 Other cardiomyopathies: Secondary | ICD-10-CM | POA: Diagnosis not present

## 2021-06-09 LAB — POCT GLYCOSYLATED HEMOGLOBIN (HGB A1C): Hemoglobin A1C: 7.7 % — AB (ref 4.0–5.6)

## 2021-06-09 MED ORDER — LANTUS SOLOSTAR 100 UNIT/ML ~~LOC~~ SOPN: 20.0000 [IU] | PEN_INJECTOR | Freq: Every day | SUBCUTANEOUS | 3 refills | Status: DC

## 2021-06-09 MED ORDER — TRULICITY 1.5 MG/0.5ML ~~LOC~~ SOAJ: 0.7500 mg | SUBCUTANEOUS | 11 refills | Status: DC

## 2021-06-09 MED ORDER — METFORMIN HCL 500 MG PO TABS: 500.0000 mg | ORAL_TABLET | Freq: Two times a day (BID) | ORAL | 3 refills | Status: DC

## 2021-06-09 NOTE — Patient Instructions (Addendum)
Please return in 3 months for your annual complete physical; please come fasting.   Things are looking better: Multiple medication adjustments: Decrease trulicity back down to 0.75mg  weekly to see if you tolerate it better.  Stop the metaglip. Start metformin 500 twice a day Increase lantus to 20mg  nighlty and increase as needed to get fasting sugars < 130 consistently. Check your sugars every morning fasting and then 2 hours after lunch or dinner. We want the after meal sugars to be < 160 consistently ideally.   If you have any questions or concerns, please don't hesitate to send me a message via MyChart or call the office at [phone]. Thank you for visiting with [place] today! It's our pleasure caring for you.

## 2021-06-09 NOTE — Progress Notes (Signed)
Subjective  CC:  Chief Complaint  Patient presents with   Diabetes    HPI: [name] is a 75 y.o. female who presents to the office today for follow up of diabetes and problems listed above in the chief complaint.  Diabetes follow up: Her diabetic control is reported as Improved.  I reviewed last note.  We have adjusted medications.  She had very elevated readings 6 weeks ago.  Since, she has been checking her sugars every morning fasting and at 5 PM before dinner.  We adjusted her medications: Added Metaglip back at a lower dose, titrated up Trulicity to 1.5 mg weekly, continues on Farxiga and added Lantus.  Lantus is at 15 units nightly.  Sugars are much improved.  Fastings are improved although she is having a few hypoglycemic events.  She is aware of them.  She also complains of a.m. nausea because of the Trulicity.  She is tolerating the Farxiga, Lantus and medically.  She does have some mild neuropathy symptoms.  Nonpainful.  No foot sores.  She is up-to-date on her immunizations and eye exam.  Overall, she is feeling significantly better.  Energy levels are improved.  Weight is up a few pounds since starting Lantus. Cardiomyopathy with heart failure: Euvolemic.  No chest pain.  No palpitations.  Reviewed cardiology follow-up.  Reviewed heart medications.  Last renal panel in May was normal.  Potassium was normal at that time as well. Hypertension: Very well controlled.  No chest pain.  No lower extremity edema. Wt Readings from Last 3 Encounters:  06/09/21 191 lb 6.4 oz (86.8 kg)  04/16/21 187 lb 12.8 oz (85.2 kg)  03/17/21 185 lb 6.4 oz (84.1 kg)  Did not work this  BP Readings from Last 3 Encounters:  06/09/21 126/70  04/16/21 130/88  03/17/21 (!) 102/58    Assessment  1. Uncontrolled type 2 diabetes mellitus with hyperglycemia ([place])   2. Nonischemic cardiomyopathy ([place])   3. Congestive heart failure with left ventricular systolic dysfunction ([place])   4. Hypertension  associated with diabetes ([place])      Plan  Diabetes is currently better controlled.  Control is much improved.  We will need to adjust medications due to side effects and hypoglycemic episodes.  Change as follows: Decrease Trulicity to 9.62 mg weekly again due to nausea.  Stop Glucotrol.  Continue metformin low-dose 500 twice daily due to diarrhea.  Continue Farxiga 10 mg daily.  Titrate Lantus up to get fastings under control, will likely take 20 to 30 units nightly.  Educated at length.  Check fastings and 2-hour postprandials.  Recheck 3 months.  Monitor neuropathy Cardiomyopathy, heart failure and hypertension are all very well controlled.  No changes in her medicines at this time.  Follow up: Return in about 3 months (around 09/09/2021) for follow up of diabetes and hypertension, complete physical.. Orders Placed This Encounter  Procedures   POCT HgB A1C    Meds ordered this encounter  Medications   metFORMIN (GLUCOPHAGE) 500 MG tablet    Sig: Take 1 tablet (500 mg total) by mouth 2 (two) times daily with a meal.    Dispense:  180 tablet    Refill:  3   insulin glargine (LANTUS SOLOSTAR) 100 UNIT/ML Solostar Pen    Sig: Inject 20-30 Units into the skin at bedtime. Titrate as discussed    Dispense:  30 mL    Refill:  3    Please dispense solostar pens w/ needles  Dulaglutide (TRULICITY) 1.5 KN/3.9JQ SOPN    Sig: Inject 0.75 mg into the skin once a week.    Dispense:  2 mL    Refill:  11      Immunization History  Administered Date(s) Administered   Fluad Quad(high Dose 65+) 07/01/2020, 04/16/2021   Influenza Split 06/01/2010   Influenza, High Dose Seasonal PF 05/13/2014, 04/24/2015, 04/12/2017, 04/19/2018, 04/24/2019, 06/16/2020   Influenza, Seasonal, Injecte, Preservative Fre 04/09/2013, 05/03/2016   Influenza,inj,Quad PF,6+ Mos 05/01/2017   Influenza-Unspecified 04/03/2012   Pneumococcal Conjugate-13 05/13/2014   Pneumococcal Polysaccharide-23 02/15/2011   Td 08/01/1998    Tdap 05/01/2004   Zoster Recombinat (Shingrix) 04/24/2019, 08/13/2019   Zoster, Live 02/15/2011    Diabetes Related Lab Review: Lab Results  Component Value Date   HGBA1C 7.7 (A) 06/09/2021   HGBA1C 10.5 (A) 04/16/2021   HGBA1C 9.6 (A) 01/14/2021    No results found for: [facility] Lab Results  Component Value Date   CREATININE 1.13 (H) 12/11/2020   BUN 25 (H) 12/11/2020   NA 137 12/11/2020   K 4.2 12/11/2020   CL 103 12/11/2020   CO2 24 12/11/2020   No results found for: CHOL No results found for: HDL No results found for: [facility] Lab Results  Component Value Date   TRIG 194 (H) 08/13/2020   TRIG 357 (H) 08/12/2020   TRIG 260 (H) 08/10/2020   No results found for: CHOLHDL No results found for: LDLDIRECT The ASCVD Risk score (Arnett DK, et al., 2019) failed to calculate for the following reasons:   Cannot find a previous HDL lab I have reviewed the [place], Fam and Soc history. Patient Active Problem List   Diagnosis Date Noted   Nonischemic cardiomyopathy ([place]) 10/06/2020    Priority: High   Congestive heart failure with left ventricular systolic dysfunction ([place]) 08/24/2020    Priority: High   Type 2 diabetes mellitus with peripheral neuropathy ([place]) 04/09/2013    Priority: High    Formatting of this note might be different from the original. New onset 04/2013, doesn't tolerate 1000mg  metformin due to GI upset.    Obesity 05/25/2011    Priority: High   Hypertension associated with diabetes ([place]) 11/08/2010    Priority: High   Primary osteoarthritis of left shoulder 07/29/2020    Priority: Medium    DDD (degenerative disc disease), cervical 04/30/2020    Priority: Medium    Osteoarthritis of left knee 11/05/2018    Priority: Medium    Chronic low back pain 09/05/2017    Priority: Medium    Degenerative lumbar disc 06/04/2015    Priority: Medium    S/P lumbar laminectomy 06/04/2015    Priority: Medium    Restless leg syndrome 05/13/2014     Priority: Medium    Spinal stenosis of lumbar region without neurogenic claudication 09/12/2012    Priority: Medium     Formatting of this note might be different from the original. Right L4/5    GERD (gastroesophageal reflux disease) 05/25/2011    Priority: Medium     Chronic recurrent heartburn symptoms for 3 or 4 years without weight loss, bleeding or dysphagia. Bonds well to daily PPI.Gatha Mayer, MD, [facility]     Mitral valve prolapse 10/10/2008    Priority: Medium     Formatting of this note might be different from the original. 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    Chronic allergic rhinitis 08/19/2010    Priority: Low    Social History: Patient  reports that she has never smoked. She has never used smokeless tobacco. She reports that she does not drink alcohol and does not use drugs.  Review of Systems: Ophthalmic: negative for eye pain, loss of vision or double vision Cardiovascular: negative for chest pain Respiratory: negative for SOB or persistent cough Gastrointestinal: negative for abdominal pain Genitourinary: negative for dysuria or gross hematuria MSK: negative for foot lesions Neurologic: negative for weakness or gait disturbance  Objective  Vitals: BP 126/70   Pulse 70   Temp 98 F (36.7 C) (Temporal)   Ht 5\' 4"  (1.626 m)   Wt 191 lb 6.4 oz (86.8 kg)   SpO2 99%   BMI 32.85 kg/m  General: well appearing, no acute distress, looks well Psych:  Alert and oriented, normal mood and affect HEENT:  Normocephalic, atraumatic, moist mucous membranes,  supple neck  Cardiovascular:  Nl S1 and S2, RRR without murmur, gallop or rub. no edema Respiratory:  Good breath sounds bilaterally, CTAB with normal effort, no rales Foot exam: no erythema, pallor, or cyanosis visible nl proprioception and sensation to monofilament testing bilaterally, +2 distal pulses bilaterally    Diabetic education: ongoing education regarding chronic disease management for diabetes was given today. We continue to reinforce the ABC's of diabetic management: A1c (<7 or 8 dependent upon patient), tight blood pressure control, and cholesterol management with goal LDL < 100 minimally. We discuss diet strategies, exercise recommendations, medication options and possible side effects. At each visit, we review recommended immunizations and preventive care recommendations for diabetics and stress that good diabetic control can prevent other problems. See below for this patient's data.   Commons side effects, risks, benefits, and alternatives for medications and treatment plan prescribed today were discussed, and the patient expressed understanding of the given instructions. Patient is instructed to call or message via MyChart if he/she has any questions or concerns regarding our treatment plan. No barriers to understanding were identified. We discussed Red Flag symptoms and signs in detail. Patient expressed understanding regarding what to do in case of urgent or emergency type symptoms.  Medication list was reconciled, printed and provided to the patient in AVS. Patient instructions and summary information was reviewed with the patient as documented in the AVS. This note was prepared with assistance of Dragon voice recognition software. Occasional wrong-word or sound-a-like substitutions may have occurred due to the inherent limitations of voice recognition software  This visit occurred during the SARS-CoV-2 public health emergency.  Safety protocols were in place, including screening  questions prior to the visit, additional usage of staff PPE, and extensive cleaning of exam room while observing appropriate contact time as indicated for disinfecting solutions.

## 2021-07-19 ENCOUNTER — Ambulatory Visit (INDEPENDENT_AMBULATORY_CARE_PROVIDER_SITE_OTHER): Payer: Medicare Other | Admitting: Family Medicine

## 2021-07-19 ENCOUNTER — Encounter: Payer: Self-pay | Admitting: Family Medicine

## 2021-07-19 VITALS — BP 120/64 | HR 81 | Temp 97.5°F | Wt 192.8 lb

## 2021-07-19 DIAGNOSIS — J0100 Acute maxillary sinusitis, unspecified: Secondary | ICD-10-CM

## 2021-07-19 DIAGNOSIS — M109 Gout, unspecified: Secondary | ICD-10-CM

## 2021-07-19 HISTORY — DX: Gout, unspecified: M10.9

## 2021-07-19 LAB — POC COVID19 BINAXNOW: SARS Coronavirus 2 Ag: NEGATIVE

## 2021-07-19 MED ORDER — ALLOPURINOL 100 MG PO TABS: 100.0000 mg | ORAL_TABLET | Freq: Every day | ORAL | 3 refills | Status: DC

## 2021-07-19 MED ORDER — AMOXICILLIN-POT CLAVULANATE 875-125 MG PO TABS: 1.0000 | ORAL_TABLET | Freq: Two times a day (BID) | ORAL | 0 refills | Status: AC

## 2021-07-19 NOTE — Progress Notes (Signed)
Subjective   CC:  Chief Complaint  Patient presents with   Sinusitis    Teeth and ear pain, head pressure   Nasal Congestion    Sinus drainage, ongoing for awhile. Symptoms come and go, have worsened since last week    HPI: [name] is a 75 y.o. female who presents to the office today to address the problems listed above in the chief complaint. Patient reports sinus congestion and pressure with thick drainage, mild nonproductive cough, ear pressure without pain, and mild malaise.  Symptoms have been present for several days. Now for 2-3 days having right sinus pain with dental pain and ear pain. Shedenies high fevers, GI symptoms, shortness of breath or cough. Shehas had sinus infections in the past and this feels similar.  Patient is a non-smoker.  No history of asthma or COPD. She is diabetic and sugars are stable.   I reviewed the patients updated PMH, FH, and SocHx.    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 ([place]) 08/24/2020    Priority: High   Type 2 diabetes mellitus with peripheral neuropathy ([place]) 04/09/2013    Priority: High   Obesity 05/25/2011    Priority: High   Hypertension associated with diabetes ([place]) 11/08/2010    Priority: High   Gout 07/19/2021    Priority: Medium    Primary osteoarthritis of left shoulder 07/29/2020    Priority: Medium    DDD (degenerative disc disease), cervical 04/30/2020    Priority: Medium    Osteoarthritis of left knee 11/05/2018    Priority: Medium    Chronic low back pain 09/05/2017    Priority: Medium    Degenerative lumbar disc 06/04/2015    Priority: Medium    S/P lumbar laminectomy 06/04/2015    Priority: Medium    Restless leg syndrome 05/13/2014    Priority: Medium    Spinal stenosis of lumbar region without neurogenic claudication 09/12/2012    Priority: Medium    GERD  (gastroesophageal reflux disease) 05/25/2011    Priority: Medium    Mitral valve prolapse 10/10/2008    Priority: Medium    S/P shoulder replacement, 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   aspirin EC 81 MG tablet Take 81 mg by mouth daily. Swallow whole.   cholecalciferol (VITAMIN D) 1000 UNITS tablet Take 1,000 Units by mouth daily.   cycloSPORINE (RESTASIS) 0.05 % ophthalmic emulsion Place 1 drop into both eyes 2 (two) times daily.   dapagliflozin propanediol (FARXIGA) 10 MG TABS tablet Take 1 tablet (10 mg total) by mouth daily before breakfast.   Dulaglutide (TRULICITY) 1.5 OV/5.6EP SOPN Inject 0.75 mg into the skin once a week.   furosemide (LASIX) 20 MG tablet Take 20 mg by mouth as directed. Monday,wed, Friday and sat only.   insulin glargine (LANTUS SOLOSTAR) 100 UNIT/ML Solostar Pen Inject 20-30 Units into the skin at bedtime. Titrate  as discussed   Magnesium Oxide 400 MG CAPS Take 1 capsule (400 mg total) by mouth daily.   metFORMIN (GLUCOPHAGE) 500 MG tablet Take 1 tablet (500 mg total) by mouth 2 (two) times daily with a meal.   montelukast (SINGULAIR) 10 MG tablet Take 1 tablet by mouth daily.   Multiple Vitamin (MULTIVITAMIN WITH MINERALS) TABS tablet Take 1 tablet by mouth daily.   omeprazole (PRILOSEC) 40 MG capsule Take 1 capsule (40 mg total) by mouth daily.   pregabalin (LYRICA) 100 MG capsule Take 1 capsule by mouth twice daily   rOPINIRole (REQUIP) 3 MG tablet Take 3 mg by mouth at bedtime.   rosuvastatin (CRESTOR) 20 MG tablet Take 1 tablet (20 mg  total) by mouth daily.   sacubitril-valsartan (ENTRESTO) 49-51 MG Take 1 tablet by mouth 2 (two) times daily.   spironolactone (ALDACTONE) 25 MG tablet Take 0.5 tablets (12.5 mg total) by mouth daily.   [DISCONTINUED] allopurinol (ZYLOPRIM) 100 MG tablet Take 100 mg by mouth daily.    Review of Systems: Cardiovascular: negative for chest pain Respiratory: negative for SOB or persistent cough Gastrointestinal: negative for abdominal pain Genitourinary: negative for dysuria or gross hematuria  Objective  Vitals: BP 120/64    Pulse 81    Temp (!) 97.5 F (36.4 C) (Temporal)    Wt 192 lb 12.8 oz (87.5 kg)    SpO2 99%    BMI 33.09 kg/m  General: no acute distress  Psych:  Alert and oriented, normal mood and affect HEENT:  Normocephalic, atraumatic, TMs with serous effusions or retraction w/o erythema, nasal mucosa is red with purulent drainage, tender maxillary sinus present, OP mild erythematous w/o eudate, supple neck without LAD Cardiovascular:  RRR without murmur or gallop. no peripheral edema Respiratory:  Good breath sounds bilaterally, CTAB with normal respiratory effort Results for orders placed or performed in visit on 07/19/21  POC COVID-19  Result Value Ref Range   SARS Coronavirus 2 Ag Negative Negative    Assessment  1. Acute non-recurrent maxillary sinusitis      Plan   Sinusitis: History and exam is most consistent with bacterial sinus infection.  Etiology and prognosis discussed with patient.  Recommend antibiotics as ordered below.  Patient to complete course of antibiotics, use supportive medications like mucolytics and decongestants as needed.  May use Tylenol or Advil if needed.  Symptoms should improve over the next 2 weeks.  Patient will return or call if symptoms persist or worsen.  Follow up: as scheduled   Commons side effects, risks, benefits, and alternatives for medications and treatment plan prescribed today were discussed, and the patient expressed  understanding of the given instructions. Patient is instructed to call or message via MyChart if he/she has any questions or concerns regarding our treatment plan. No barriers to understanding were identified. We discussed Red Flag symptoms and signs in detail. Patient expressed understanding regarding what to do in case of urgent or emergency type symptoms.  Medication list was reconciled, printed and provided to the patient in AVS. Patient instructions and summary information was reviewed with the patient as documented in the AVS. This note was prepared with assistance of Dragon voice recognition software. Occasional wrong-word or sound-a-like substitutions may have occurred due to the inherent limitations of voice recognition software  Orders Placed This Encounter  Procedures   POC COVID-19   Meds ordered this encounter  Medications   amoxicillin-clavulanate (AUGMENTIN) 875-125 MG tablet    Sig: Take 1 tablet by mouth  2 (two) times daily for 10 days.    Dispense:  20 tablet    Refill:  0   allopurinol (ZYLOPRIM) 100 MG tablet    Sig: Take 1 tablet (100 mg total) by mouth daily.    Dispense:  90 tablet    Refill:  3

## 2021-07-19 NOTE — Patient Instructions (Signed)
Please follow up as scheduled for your next visit with me: 09/28/2021   If you have any questions or concerns, please don't hesitate to send me a message via MyChart or call the office at [phone]. Thank you for visiting with [place] today! It's our pleasure caring for you.

## 2021-08-03 NOTE — Progress Notes (Signed)
Cardiology Office Note:    Date:  08/06/2021   ID:  [name], DOB 1946/06/28, MRN [id number]  PCP:  Leamon Arnt, MD   [place]  Cardiologist:  Freada Bergeron, MD  Advanced Practice Provider:  No care team member to display Electrophysiologist:  None    Referring MD: Leamon Arnt, MD    History of Present Illness:    [name] is a 76 y.o. 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 She now presents to clinic for follow-up.   Patient was diagnosed with new onset cardiomyopathy in 08/2020 when she presented with SOB found to have COVID at that time. TTE with LVEF 20-25%. Was initially stable and discharged home but returned to [facility] on 08/10/2020 with progressive shortness of breath and LE edema requiring ICU admission and intubation. She was aggressively diuresed and extubated and evenutally weaned to RA. Initial weight was 205lbs and she was diuresed to 187lbs. She was discharged home on lasix 40mg  daily.   After discharge, we arranged for coronary angiography on 09/07/20 for work-up of newly diagnosed HFrEF which revealed no significant obstructive disease. She returned to clinic on 09/21/20 where she felt overall improved. Had DOE with stairs. Wt was stable and blood pressure was well controlled. Repeat TTE 10/15/20 with mild improvement in LVEF to 30-35%, G2DD, elevated LAP, normal RA, mildly dilated LA, mild MR. Cardiac monitor that was placed for frequent ventricular ectopy revealed several runs of NSVT and frequent PVCs but no sustained arrhythmias or Afib.  During visit on 10/20/20, she was overall feeling better. She was euvolemic. We increased her entresto and started spironolactone. Echo 01/27/21 LVEF 45%, RV normal  size and function, trivial MR.   Last saw Laurann Montana on 03/17/21 where she was doing well. Was staying active. Had mild dyspnea on exertion. Was compliant with meds.  Today, the patient overall feels well. She has remained active but has back pain and sciatica. She is planned to see her ortho for consideration of another injection. Otherwise, no chest pain, SOB, orthopnea, PND or LE edema. Occasional dizziness when changing positions with no syncope. Has been compliant with all medications. Blood pressure 120/60s at home.   Past Medical History:  Diagnosis Date   Arthritis    Chronic back pain    buldging disc,scoliosis,arthritis   Congestive heart failure with left ventricular systolic dysfunction ([place]) 08/24/2020   Diabetes mellitus without complication (HCC)    takes Trulicity,Jardiance,and Metformin daily.Average fasting blood sugar runs around130   GERD (gastroesophageal reflux disease)    takes Omeprazole daily   Gout 07/19/2021   On allopurinol   History of bronchitis > 8 yrs ago   History of shingles    HTN (hypertension)    takes Amlodipine and Micardis daily   Hx of colonic polyps    benign   Internal and external hemorrhoids without complication    Joint pain    Mitral valve prolapse    Nocturia    PONV (postoperative nausea and vomiting)    when gets injections in joints gets hives.Betadine rash   Restless leg syndrome    takes Requip at bedtime   Seasonal allergies    takes Claritin daily as needed   Urinary frequency  Uterine fibroid     Past Surgical History:  Procedure Laterality Date   BUNIONECTOMY Bilateral    COLONOSCOPY  07/12/2004   diverticulosis, internal and external hemorrhoids   COLONOSCOPY     FINGER ARTHROSCOPY WITH CARPOMETACARPEL ([place]) ARTHROPLASTY Right 09/28/2015   Procedure: RIGHT THUMB TRAPEZIUM EXCISION WITH CARPOMETACARPEL ([place]) ARTHROPLASTY AND TENDON TRANSFER;  Surgeon: Iran Planas, MD;  Location: [place];  Service: Orthopedics;   Laterality: Right;   HEMORRHOID SURGERY     almost 40 yrs ago   HYSTEROSCOPY WITH D & C  08/23/2000   and resectoscopic myomectomy   LUMBAR DISC SURGERY  03/16/2005   LUMBAR EPIDURAL INJECTION     PLANTAR FASCIA RELEASE Left 02/03/2010   and torn tendon   REVERSE SHOULDER ARTHROPLASTY Right 12/14/2020   Procedure: REVERSE SHOULDER ARTHROPLASTY;  Surgeon: Netta Cedars, MD;  Location: [facility];  Service: Orthopedics;  Laterality: Right;   RIGHT/LEFT HEART CATH AND CORONARY ANGIOGRAPHY N/A 09/07/2020   Procedure: RIGHT/LEFT HEART CATH AND CORONARY ANGIOGRAPHY;  Surgeon: Nelva Bush, MD;  Location: [facility];  Service: Cardiovascular;  Laterality: N/A;   TENDON TRANSFER Right 09/28/2015   Procedure: TENDON TRANSFER;  Surgeon: Iran Planas, MD;  Location: [place];  Service: Orthopedics;  Laterality: Right;   TONSILLECTOMY     TUBAL LIGATION     WRIST SURGERY     left, removal of cyst    Current Medications: Current Meds  Medication Sig   allopurinol (ZYLOPRIM) 100 MG tablet Take 1 tablet (100 mg total) by mouth daily.   aspirin EC 81 MG tablet Take 1 tablet (81 mg total) by mouth 3 (three) times a week. Swallow whole.   cholecalciferol (VITAMIN D) 1000 UNITS tablet Take 1,000 Units by mouth daily.   cycloSPORINE (RESTASIS) 0.05 % ophthalmic emulsion Place 1 drop into both eyes 2 (two) times daily.   dapagliflozin propanediol (FARXIGA) 10 MG TABS tablet Take 1 tablet (10 mg total) by mouth daily before breakfast.   Dulaglutide (TRULICITY) 1.5 LN/9.8XQ SOPN Inject 0.75 mg into the skin once a week.   furosemide (LASIX) 20 MG tablet Take 20 mg by mouth as directed. Monday,wed, Friday and sat only.   insulin glargine (LANTUS SOLOSTAR) 100 UNIT/ML Solostar Pen Inject 20-30 Units into the skin at bedtime. Titrate as discussed   Magnesium Oxide 400 MG CAPS Take 1 capsule (400 mg total) by mouth daily.   metFORMIN (GLUCOPHAGE) 500 MG tablet Take 1 tablet (500 mg total) by mouth 2 (two) times  daily with a meal.   montelukast (SINGULAIR) 10 MG tablet Take 1 tablet by mouth daily.   Multiple Vitamin (MULTIVITAMIN WITH MINERALS) TABS tablet Take 1 tablet by mouth daily.   omeprazole (PRILOSEC) 40 MG capsule Take 1 capsule (40 mg total) by mouth daily.   pregabalin (LYRICA) 100 MG capsule Take 1 capsule by mouth twice daily   rOPINIRole (REQUIP) 3 MG tablet Take 3 mg by mouth at bedtime.   rosuvastatin (CRESTOR) 20 MG tablet Take 1 tablet (20 mg total) by mouth daily.   sacubitril-valsartan (ENTRESTO) 49-51 MG Take 1 tablet by mouth 2 (two) times daily.   spironolactone (ALDACTONE) 25 MG tablet Take 0.5 tablets (12.5 mg total) by mouth daily.   [DISCONTINUED] aspirin EC 81 MG tablet Take 81 mg by mouth daily. Swallow whole.     Allergies:   Amlodipine, Depo-medrol [methylprednisolone], Gabapentin, Levothyroxine, Sitagliptin, Ace inhibitors, Betadine [povidone iodine], Codeine, Cortizone-10 [hydrocortisone], Tetracyclines & related, and Dexamethasone   Social History  Socioeconomic History   Marital status: Married    Spouse name: Not on file   Number of children: 3   Years of education: Not on file   Highest education level: Not on file  Occupational History   Occupation: Retired   Tobacco Use   Smoking status: Never   Smokeless tobacco: Never  Vaping Use   Vaping Use: Never used  Substance and Sexual Activity   Alcohol use: No    Alcohol/week: 0.0 standard drinks   Drug use: No   Sexual activity: Yes  Other Topics Concern   Not on file  Social History Narrative   1 caffeine drinks daily         Social Determinants of Health   Financial Resource Strain: Low Risk    Difficulty of Paying Living Expenses: Not hard at all  Food Insecurity: No Food Insecurity   Worried About Charity fundraiser in the Last Year: Never true   [place] in the Last Year: Never true  Transportation Needs: No Transportation Needs   Lack of Transportation (Medical): No   Lack of  Transportation (Non-Medical): No  Physical Activity: Inactive   Days of Exercise per Week: 0 days   Minutes of Exercise per Session: 0 min  Stress: No Stress Concern Present   Feeling of Stress : Not at all  Social Connections: Moderately Integrated   Frequency of Communication with Friends and Family: Once a week   Frequency of Social Gatherings with Friends and Family: Twice a week   Attends Religious Services: More than 4 times per year   Active Member of [employer] or Organizations: No   Attends Archivist Meetings: Never   Marital Status: Married     Family History: The patient's family history includes Pancreatic cancer in her father. There is no history of Colon cancer or Breast cancer.  ROS:   Please see the history of present illness.    All other systems reviewed and are negative.  EKGs/Labs/Other Studies Reviewed:    The following studies were reviewed today: TTE 2021-01-28 1. Left ventricular ejection fraction, by visual estimation, is 45%. The  left ventricle has mildly decreased function. The average left ventricular  global longitudinal strain is -18.5 % (with resoanable trace and only  slight foreshortening) suggestive  of near normal LV function. The left ventricle demonstrates global  hypokinesis, though anterolateral walls and inferolateral walls  hypokinesis is most prominent. Left ventricular diastolic parameters are  indeterminate in this study.   2. Right ventricular systolic function is normal. The right ventricular  size is normal. Tricuspid regurgitation signal is inadequate for assessing  PA pressure.   3. The mitral valve is grossly normal. Trivial mitral valve  regurgitation. No evidence of mitral stenosis.   4. The aortic valve is tricuspid. Aortic valve regurgitation is not  visualized. No aortic stenosis is present.   TTE 10/15/20: 1. Left ventricular ejection fraction, by estimation, is 30 to 35%. The  left ventricle has moderately  decreased function. The left ventricle  demonstrates global hypokinesis. Left ventricular diastolic parameters are  consistent with Grade II diastolic  dysfunction (pseudonormalization). Elevated left atrial pressure. The E/e'  is 22.0.   2. Right ventricular systolic function is normal. The right ventricular  size is normal. Tricuspid regurgitation signal is inadequate for assessing  PA pressure.   3. Left atrial size was mildly dilated.   4. The mitral valve is grossly normal. Mild mitral valve regurgitation.  No  evidence of mitral stenosis.   5. The aortic valve is tricuspid. Aortic valve regurgitation is not  visualized. No aortic stenosis is present.   6. The inferior vena cava is normal in size with greater than 50%  respiratory variability, suggesting right atrial pressure of 3 mmHg.    Cardiac Monitor 10/09/20: Patient's monitoring period was from 09/08/20-10/07/20 Predominant rhythm was NSR with frequent PVCs. Average HR 84bpm (ranged from 59-84bpm) There were several runs of NSVT, however, no sustained VT (longest episode was 11 beats) Frequent ventricular ectopy was detected (10%; 314,307 beats) SVE was rare <1% No afib or significant pauses Patient triggered events correlated with PVCs and NSVT.   TTE 08/14/20:  1. Left ventricular ejection fraction, by estimation, is 20 to 25%. The  left ventricle has severely decreased function. The left ventricle has no  regional wall motion abnormalities. Left ventricular diastolic parameters  are indeterminate.   2. Right ventricular systolic function was not well visualized. The right  ventricular size is not well visualized.   3. Left atrial size was moderately dilated.   4. The mitral valve is grossly normal. Mild mitral valve regurgitation.  There is a second jet of mitral regurgitation seen in the four chamber  view with eccentric pattern, mitral regurgitation may be underestimated in  this study.   5. The aortic valve is  tricuspid. Aortic valve regurgitation is not  visualized. No aortic stenosis is present.   6. The inferior vena cava is dilated in size with <50% respiratory  variability, suggesting right atrial pressure of 15 mmHg.    TTE 08/04/20 IMPRESSIONS   1. Left ventricular ejection fraction, by estimation, is 20 to 25%. The  left ventricle has severely decreased function. The left ventricle  demonstrates global hypokinesis. The left ventricular internal cavity size  was mildly to moderately dilated. Left  ventricular diastolic parameters are consistent with Grade II diastolic  dysfunction (pseudonormalization). Elevated left atrial pressure.   2. Right ventricular systolic function is normal. The right ventricular  size is normal. There is mildly elevated pulmonary artery systolic  pressure. The estimated right ventricular systolic pressure is 52.7 mmHg.   3. Left atrial size was moderately dilated.   4. The mitral valve is degenerative. Mild mitral valve regurgitation. No  evidence of mitral stenosis.   5. The aortic valve is tricuspid. There is mild calcification of the  aortic valve. There is mild thickening of the aortic valve. Aortic valve  regurgitation is not visualized. Mild aortic valve sclerosis is present,  with no evidence of aortic valve  stenosis.   6. The inferior vena cava is dilated in size with <50% respiratory  variability, suggesting right atrial pressure of 15 mmHg.   FINDINGS   Left Ventricle: Left ventricular ejection fraction, by estimation, is 20  to 25%. The left ventricle has severely decreased function. The left  ventricle demonstrates global hypokinesis. The left ventricular internal  cavity size was mildly to moderately  dilated. There is no left ventricular hypertrophy. Left ventricular  diastolic parameters are consistent with Grade II diastolic dysfunction  (pseudonormalization). Elevated left atrial pressure.   Right Ventricle: The right ventricular size  is normal. No increase in  right ventricular wall thickness. Right ventricular systolic function is  normal. There is mildly elevated pulmonary artery systolic pressure. The  tricuspid regurgitant velocity is 2.39   m/s, and with an assumed right atrial pressure of 15 mmHg, the estimated  right ventricular systolic pressure is 78.2 mmHg.   Left  Atrium: Left atrial size was moderately dilated.   Right Atrium: Right atrial size was normal in size.   Pericardium: Trivial pericardial effusion is present.   Mitral Valve: The mitral valve is degenerative in appearance. Mild mitral  annular calcification. Mild mitral valve regurgitation. No evidence of  mitral valve stenosis.   Tricuspid Valve: The tricuspid valve is grossly normal. Tricuspid valve  regurgitation is mild . No evidence of tricuspid stenosis.   Aortic Valve: The aortic valve is tricuspid. There is mild calcification  of the aortic valve. There is mild thickening of the aortic valve. Aortic  valve regurgitation is not visualized. Mild aortic valve sclerosis is  present, with no evidence of aortic  valve stenosis.   Pulmonic Valve: The pulmonic valve was grossly normal. Pulmonic valve  regurgitation is trivial. No evidence of pulmonic stenosis.   Aorta: The aortic root and ascending aorta are structurally normal, with  no evidence of dilitation.   Venous: The inferior vena cava is dilated in size with less than 50%  respiratory variability, suggesting right atrial pressure of 15 mmHg.   IAS/Shunts: The atrial septum is grossly normal.     RHC/LHC 09/07/20: Conclusions: Mild plaquing of the mid LAD with 10-20% stenosis.  Otherwise, no angiographically significant coronary artery disease. Normal left and right heart filling pressures. Borderline elevated pulmonary artery pressure. Normal Fick cardiac output/index.   Recommendations: Maintain net even fluid balance and optimize goal directed medical therapy for  non-ischemic cardiomyopathy. Primary    EKG:   08/06/21: NSR with HR 69, 1 degree AVB 11/11/2020: EKG is not ordered today.    Recent Labs: 08/16/2020: Magnesium 2.1 08/19/2020: B Natriuretic Peptide 146.3 08/20/2020: ALT 45 12/11/2020: BUN 25; Creatinine, Ser 1.13; Hemoglobin 13.5; Platelets 255; Potassium 4.2; Sodium 137  Recent Lipid Panel    Component Value Date/Time   TRIG 194 (H) 08/13/2020 1035      Physical Exam:    VS:  BP 130/80    Pulse 69    Ht 5\' 4"  (1.626 m)    Wt 194 lb 3.2 oz (88.1 kg)    SpO2 96%    BMI 33.33 kg/m     Wt Readings from Last 3 Encounters:  08/06/21 194 lb 3.2 oz (88.1 kg)  07/19/21 192 lb 12.8 oz (87.5 kg)  06/09/21 191 lb 6.4 oz (86.8 kg)     GEN: Well nourished, well developed in no acute distress HEENT: Normal NECK: No JVD; No carotid bruits CARDIAC: RRR, 1/6 systolic murmur, no rubs, gallops RESPIRATORY:  Clear to auscultation without rales, wheezing or rhonchi  ABDOMEN: Soft, non-tender, non-distended MUSCULOSKELETAL:  No edema; No deformity  SKIN: Warm and dry NEUROLOGIC:  Alert and oriented x 3 PSYCHIATRIC:  Normal affect   ASSESSMENT:    1. HFrEF (heart failure with reduced ejection fraction) ([place])   2. Nonischemic cardiomyopathy (HCC)   3. Stage 3a chronic kidney disease ([place])   4. Controlled type 2 diabetes mellitus without complication, without long-term current use of insulin ([place])   5. PVC (premature ventricular contraction)   6. Coronary artery disease involving native coronary artery of native heart without angina pectoris   7. Hypertension associated with diabetes ([place])     PLAN:    In order of problems listed above:  #Newly diagnosed HFrEF:  #Non-ischemic cardiomypathy: Diagnosed in the setting of COVID-19 pneumonia. Cath with mild, non-obstructive disease. Initial TTE with LVEF 20-25%-->30-35%-->45%. Currently,  patient is doing well with NYHA class II symptoms. Euvolemic. -Continue lasix  20mg  4x/wk (M, W, F,  Sat) -Continue K supplementation -Continue coreg to 3.125mg  BID  -Continue entresto to 49/51mg  BID -Continue farxiga 10mg  daily -Continue spironolactone 12.5mg  daily  -Cath without obstructive disease -EF improved, no need for ICD -Continue daily weights and call if gaining >2-3lbs in 2 days or 5lbs in 1 week -Low Na diet   #Non-obstructive CAD: Cath on 09/07/20 with mild plaquing of the mid LAD with 10-20% stenosis.  Otherwise, no angiographically significant coronary artery disease. -Can stop ASA due to mild disease -Continue crestor 20mg  daily   #PVCs/NSVT: Cardiac monitor with several runs of NSVT (longest 11 beats) and frequent (10%) burden of PVCs. No sustained arrhythmias. -Monitor electrolytes carefully with diuresis -Continue coreg as above -Manage HF as above   #CKD: Cr back to baseline. -Lasix as above -Follow-up with PCP as scheduled for labs   #DMII: -Continue farxiga   Medication Adjustments/Labs and Tests Ordered: Current medicines are reviewed at length with the patient today.  Concerns regarding medicines are outlined above.  Orders Placed This Encounter  Procedures   EKG 12-Lead   Meds ordered this encounter  Medications   aspirin EC 81 MG tablet    Sig: Take 1 tablet (81 mg total) by mouth 3 (three) times a week. Swallow whole.    Dispense:  90 tablet    Refill:  3    Patient Instructions  Medication Instructions:   YOU MAY TAKE ASPIRIN 81 MG BY MOUTH THREE TIMES WEEKLY  *If you need a refill on your cardiac medications before your next appointment, please call your pharmacy*   Follow-Up:  IN April 2023 WITH DR. Johney Frame      Follow-up in 3 months.  I,Mathew Stumpf,acting as a Education administrator for Freada Bergeron, MD.,have documented all relevant documentation on the behalf of Freada Bergeron, MD,as directed by  Freada Bergeron, MD while in the presence of Freada Bergeron, MD.  I, Freada Bergeron, MD, have reviewed all  documentation for this visit. The documentation on 08/06/21 for the exam, diagnosis, procedures, and orders are all accurate and complete.  Signed, Freada Bergeron, MD  08/06/2021 9:00 AM    [place]

## 2021-08-06 ENCOUNTER — Other Ambulatory Visit: Payer: Self-pay

## 2021-08-06 ENCOUNTER — Encounter: Payer: Self-pay | Admitting: Cardiology

## 2021-08-06 ENCOUNTER — Ambulatory Visit (INDEPENDENT_AMBULATORY_CARE_PROVIDER_SITE_OTHER): Payer: Medicare Other | Admitting: Cardiology

## 2021-08-06 VITALS — BP 130/80 | HR 69 | Ht 64.0 in | Wt 194.2 lb

## 2021-08-06 DIAGNOSIS — I2510 Atherosclerotic heart disease of native coronary artery without angina pectoris: Secondary | ICD-10-CM

## 2021-08-06 DIAGNOSIS — N1831 Chronic kidney disease, stage 3a: Secondary | ICD-10-CM | POA: Diagnosis not present

## 2021-08-06 DIAGNOSIS — I5020 Unspecified systolic (congestive) heart failure: Secondary | ICD-10-CM | POA: Diagnosis not present

## 2021-08-06 DIAGNOSIS — I493 Ventricular premature depolarization: Secondary | ICD-10-CM

## 2021-08-06 DIAGNOSIS — I428 Other cardiomyopathies: Secondary | ICD-10-CM | POA: Diagnosis not present

## 2021-08-06 DIAGNOSIS — I152 Hypertension secondary to endocrine disorders: Secondary | ICD-10-CM

## 2021-08-06 DIAGNOSIS — E1159 Type 2 diabetes mellitus with other circulatory complications: Secondary | ICD-10-CM

## 2021-08-06 DIAGNOSIS — E119 Type 2 diabetes mellitus without complications: Secondary | ICD-10-CM | POA: Diagnosis not present

## 2021-08-06 MED ORDER — ASPIRIN EC 81 MG PO TBEC: 81.0000 mg | DELAYED_RELEASE_TABLET | ORAL | 3 refills | Status: DC

## 2021-08-06 NOTE — Patient Instructions (Signed)
Medication Instructions:   YOU MAY TAKE ASPIRIN 81 MG BY MOUTH THREE TIMES WEEKLY  *If you need a refill on your cardiac medications before your next appointment, please call your pharmacy*   Follow-Up:  IN April 2023 WITH DR. Johney Frame

## 2021-08-16 ENCOUNTER — Other Ambulatory Visit: Payer: Self-pay

## 2021-08-16 MED ORDER — ROSUVASTATIN CALCIUM 20 MG PO TABS: 20.0000 mg | ORAL_TABLET | Freq: Every day | ORAL | 3 refills | Status: DC

## 2021-08-17 ENCOUNTER — Other Ambulatory Visit: Payer: Self-pay

## 2021-08-17 ENCOUNTER — Ambulatory Visit: Payer: Medicare Other | Attending: Chiropractic Medicine | Admitting: Physical Therapy

## 2021-08-17 DIAGNOSIS — M545 Low back pain, unspecified: Secondary | ICD-10-CM | POA: Diagnosis present

## 2021-08-17 DIAGNOSIS — R293 Abnormal posture: Secondary | ICD-10-CM | POA: Insufficient documentation

## 2021-08-17 NOTE — Therapy (Signed)
[facility] [place], [place], [address] Phone: [phone]   Fax:  [phone]  Physical Therapy Evaluation  Patient Details  Name: [name] MRN: [id number] Date of Birth: [birth year] Referring Provider (PT): Levy Pupa   Encounter Date: 08/17/2021   PT End of Session - 08/17/21 1050     Visit Number 1    Number of Visits 12    Date for PT Re-Evaluation 11/15/21    Authorization Type FOTO AT LEAST EVERY 5TH VISIT.  PROGRESS NOTE AT 10TH VISIT.  KX MODIFIER AFTER 15 VISITS.    PT Start Time 1026    PT Stop Time 1115    PT Time Calculation (min) 49 min    Activity Tolerance Patient tolerated treatment well    Behavior During Therapy WFL for tasks assessed/performed             Past Medical History:  Diagnosis Date   Arthritis    Chronic back pain    buldging disc,scoliosis,arthritis   Congestive heart failure with left ventricular systolic dysfunction ([place]) 08/24/2020   Diabetes mellitus without complication ([place])    takes Trulicity,Jardiance,and Metformin daily.Average fasting blood sugar runs around130   GERD (gastroesophageal reflux disease)    takes Omeprazole daily   Gout 07/19/2021   On allopurinol   History of bronchitis > 8 yrs ago   History of shingles    HTN (hypertension)    takes Amlodipine and Micardis daily   Hx of colonic polyps    benign   Internal and external hemorrhoids without complication    Joint pain    Mitral valve prolapse    Nocturia    PONV (postoperative nausea and vomiting)    when gets injections in joints gets hives.Betadine rash   Restless leg syndrome    takes Requip at bedtime   Seasonal allergies    takes Claritin daily as needed   Urinary frequency    Uterine fibroid     Past Surgical History:  Procedure Laterality Date   BUNIONECTOMY Bilateral    COLONOSCOPY  07/12/2004   diverticulosis, internal and external hemorrhoids   COLONOSCOPY     FINGER  ARTHROSCOPY WITH CARPOMETACARPEL ([place]) ARTHROPLASTY Right 09/28/2015   Procedure: RIGHT THUMB TRAPEZIUM EXCISION WITH CARPOMETACARPEL ([place]) ARTHROPLASTY AND TENDON TRANSFER;  Surgeon: Iran Planas, MD;  Location: [facility];  Service: Orthopedics;  Laterality: Right;   HEMORRHOID SURGERY     almost 40 yrs ago   HYSTEROSCOPY WITH D & C  08/23/2000   and resectoscopic myomectomy   LUMBAR DISC SURGERY  03/16/2005   LUMBAR EPIDURAL INJECTION     PLANTAR FASCIA RELEASE Left 02/03/2010   and torn tendon   REVERSE SHOULDER ARTHROPLASTY Right 12/14/2020   Procedure: REVERSE SHOULDER ARTHROPLASTY;  Surgeon: Netta Cedars, MD;  Location: [facility];  Service: Orthopedics;  Laterality: Right;   RIGHT/LEFT HEART CATH AND CORONARY ANGIOGRAPHY N/A 09/07/2020   Procedure: RIGHT/LEFT HEART CATH AND CORONARY ANGIOGRAPHY;  Surgeon: Nelva Bush, MD;  Location: [facility];  Service: Cardiovascular;  Laterality: N/A;   TENDON TRANSFER Right 09/28/2015   Procedure: TENDON TRANSFER;  Surgeon: Iran Planas, MD;  Location: [facility];  Service: Orthopedics;  Laterality: Right;   TONSILLECTOMY     TUBAL LIGATION     WRIST SURGERY     left, removal of cyst    There were no vitals filed for this visit.    Subjective Assessment - 08/17/21 1052     Subjective  COVID-19 screen performed prior to patient entering clinic.  The patient presents to the clinic today with c/o low back pain that came on for no apparenet reason on 08/01/21.  The pain was so bad initially she was experiencing pain into her left groin and into her thigh and had to sleep in a recliner.  Her pain is now loacalzed in her right low back region.  At rest today her pain is rated at a 4/10 but walking and standing can cause her pain to rise to much higher levels.  She states that when she uses a cane her back feels better or if she shops it is helpful to support herself on a shopping cart.    Pertinent History Right TSA, OA, OP (per patient report), lumbar surgery,  HTN.    How long can you sit comfortably? Unlimited.    How long can you stand comfortably? About 5 minutes.    How long can you walk comfortably? About 5 minutes unsupported.    Patient Stated Goals Get out of pain.    Currently in Pain? Yes    Pain Score 4     Pain Location Back    Pain Orientation Right    Pain Descriptors / Indicators Throbbing    Pain Type Acute pain    Pain Onset 1 to 4 weeks ago    Pain Frequency Constant    Aggravating Factors  See above.    Pain Relieving Factors See above.                [facility] PT Assessment - 08/17/21 0001       Assessment   Medical Diagnosis Spondylosis, lumbar region.    Referring Provider (PT) Levy Pupa    Onset Date/Surgical Date 08/01/21      Precautions   Precaution Comments OP(per patient).      Restrictions   Weight Bearing Restrictions No      Balance Screen   Has the patient fallen in the past 6 months No    Has the patient had a decrease in activity level because of a fear of falling?  No    Is the patient reluctant to leave their home because of a fear of falling?  No      Home Environment   Living Environment Private residence      Prior Function   Level of Independence Independent      Posture/Postural Control   Posture/Postural Control Postural limitations    Postural Limitations Rounded Shoulders;Forward head;Decreased lumbar lordosis;Flexed trunk;Weight shift left    Posture Comments Left genu valgum, left trunk lean.  Right iliac crest higher.      Deep Tendon Reflexes   DTR Assessment Site Patella;Achilles    Patella DTR 2+    Achilles DTR 1+      ROM / Strength   AROM / PROM / Strength AROM;Strength      AROM   Overall AROM Comments In supine:  Hip flexion, IR/ER is WFL.      Strength   Overall Strength Comments Bilateral knee and ankle strength is normal.  Hip flexion/abduction is 4 to 4+/5.      Palpation   Palpation comment Tender and very taut to palption over patient's right  lumbar musculature.      Special Tests   Other special tests Equal leg lengths. (-) SLR testing. (-) FABER/FADIR testing.      Ambulation/Gait   Gait Comments The patient walks with a flexed trunk  and leftward leaning posture.                        Objective measurements completed on examination: See above findings.       [facility] Adult PT Treatment/Exercise - 08/17/21 0001       Modalities   Modalities Electrical Stimulation;Moist Heat      Moist Heat Therapy   Number Minutes Moist Heat 15 Minutes    Moist Heat Location Lumbar Spine      Electrical Stimulation   Electrical Stimulation Location Right low back.    Electrical Stimulation Action IFC at 80-150 Hz.    Electrical Stimulation Parameters 40% scan x 15 minutes.    Electrical Stimulation Goals Tone;Pain                          PT Long Term Goals - 08/17/21 1216       PT LONG TERM GOAL #1   Title Independent with a HEP.    Time 6    Period Weeks    Status New      PT LONG TERM GOAL #2   Title Stand 20 minutes with pain not > 3/10.    Time 6    Period Weeks    Status New      PT LONG TERM GOAL #3   Title Walk for shopping with pain not > 3/10.    Time 6    Period Weeks    Status New      PT LONG TERM GOAL #4   Title Perform ADL's with pain not > 3/10.    Time 6    Period Weeks    Status New                    Plan - 08/17/21 1200     Clinical Impression Statement The patient presents to OPPT with c/o right sided low back pain that reaches near severe levels with short distance unsupported walking and standing.  She was found to have multiple postural abnormalites.  She is tender and taut to palpation over her right lumbar musculature.  Special testing is negative.  The pain came on suddenly on 08/01/21 for no apparent reason.  She demonstrates some mild hip weakness.  Patient will benefit from skilled physical therapy intervention to address pain and deficits.     Personal Factors and Comorbidities Comorbidity 1;Other    Comorbidities Right TSA, OA, OP (per patient report), lumbar surgery, HTN.    Examination-Activity Limitations Other;Locomotion Level;Stand    Examination-Participation Restrictions Other    Stability/Clinical Decision Making Stable/Uncomplicated    Clinical Decision Making Low    Rehab Potential Excellent    PT Frequency 2x / week    PT Duration 6 weeks    PT Treatment/Interventions ADLs/Self Care Home Management;Cryotherapy;Electrical Stimulation;Ultrasound;Moist Heat;Functional mobility training;Therapeutic activities;Therapeutic exercise;Manual techniques;Patient/family education;Passive range of motion    PT Next Visit Plan Combo e'stim/US; STW/M, core exercise progression (no spinal loading due to reported OP).    Consulted and Agree with Plan of Care Patient             Patient will benefit from skilled therapeutic intervention in order to improve the following deficits and impairments:  Pain, Abnormal gait, Decreased activity tolerance, Decreased strength, Increased muscle spasms, Postural dysfunction  Visit Diagnosis: Acute right-sided low back pain without sciatica - Plan: PT plan of care cert/re-cert  Abnormal posture -  Plan: PT plan of care cert/re-cert     Problem List Patient Active Problem List   Diagnosis Date Noted   Gout 07/19/2021   S/P shoulder replacement, right 12/14/2020   Nonischemic cardiomyopathy ([place]) 10/06/2020   Congestive heart failure with left ventricular systolic dysfunction ([place]) 08/24/2020   Primary osteoarthritis of left shoulder 07/29/2020   Allergy to statin medication 06/16/2020   DDD (degenerative disc disease), cervical 04/30/2020   Statin intolerance 08/22/2019   Osteoarthritis of left knee 11/05/2018   Chronic pansinusitis 10/04/2017   Chronic low back pain 09/05/2017   Degenerative lumbar disc 06/04/2015   S/P lumbar laminectomy 06/04/2015   Restless leg syndrome  05/13/2014   Type 2 diabetes mellitus with peripheral neuropathy ([place]) 04/09/2013   Spinal stenosis of lumbar region without neurogenic claudication 09/12/2012   GERD (gastroesophageal reflux disease) 05/25/2011   Hemorrhoids, internal, with bleeding 05/25/2011   Obesity 05/25/2011   Hypertension associated with diabetes ([place]) 11/08/2010   Chronic allergic rhinitis 08/19/2010   Mitral valve prolapse 10/10/2008    Danell Verno, [place], PT 08/17/2021, 12:30 PM  [facility] [place], [place], [address] Phone: [phone]   Fax:  [phone]  Name: [name] MRN: [id number] Date of Birth: October 29, 1945

## 2021-08-18 IMAGING — MG MM DIGITAL SCREENING BILAT W/ TOMO AND CAD
6 of 10 series · 6 of 30 positions shown · non-contrast
Comparison: Previous exam(s).

CLINICAL DATA: Screening.

EXAM:
DIGITAL SCREENING BILATERAL MAMMOGRAM WITH TOMOSYNTHESIS AND CAD
TECHNIQUE: Bilateral screening digital craniocaudal and mediolateral oblique
mammograms were obtained. Bilateral screening digital breast
tomosynthesis was performed. The images were evaluated with
computer-aided detection.

[R MLO synth-2D]
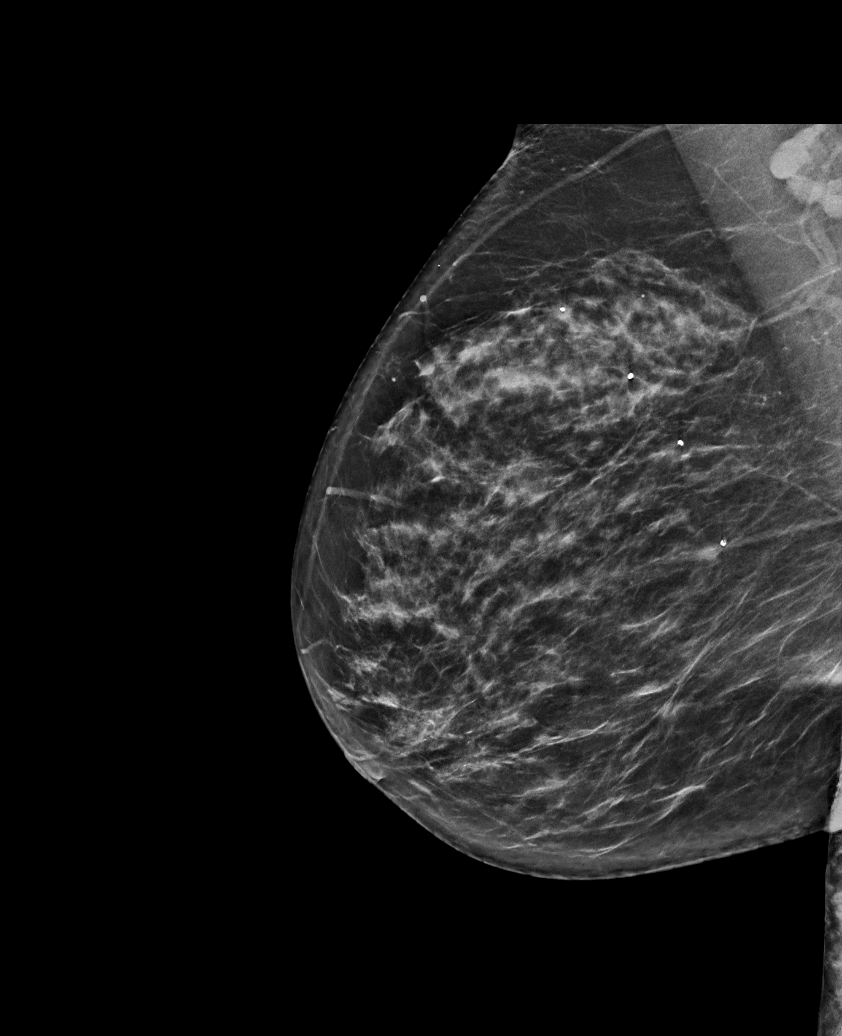

[R CC synth-2D]
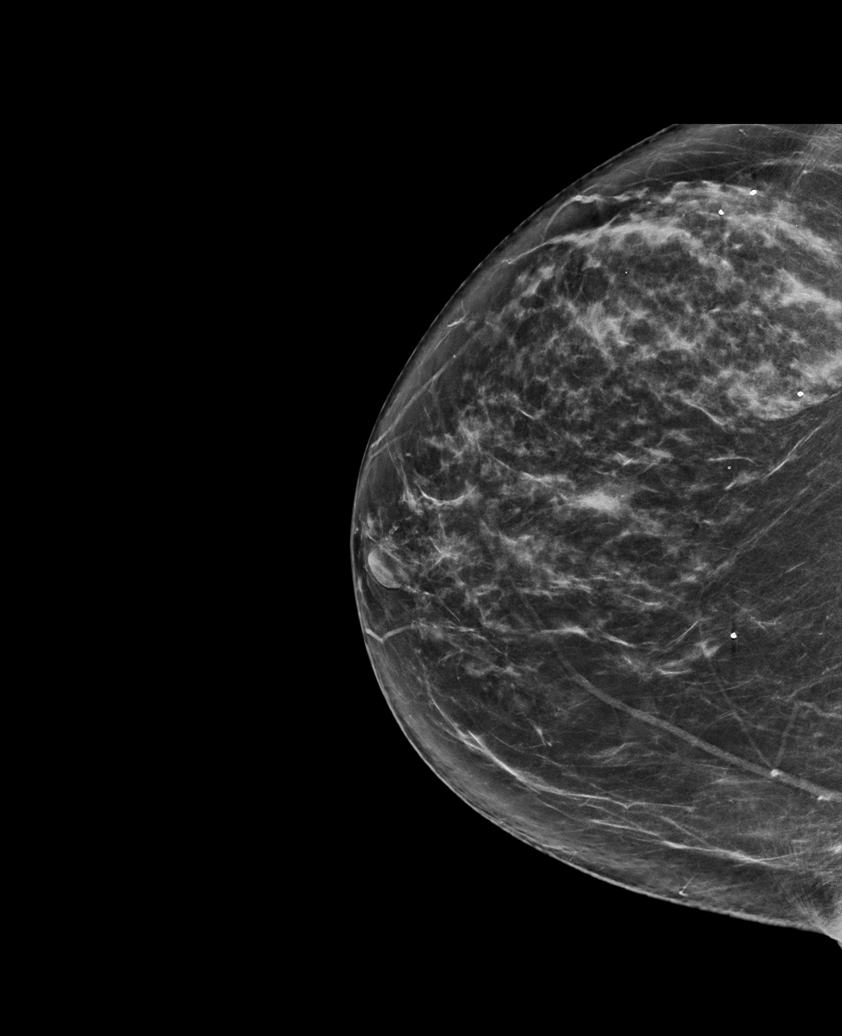

[L CC synth-2D]
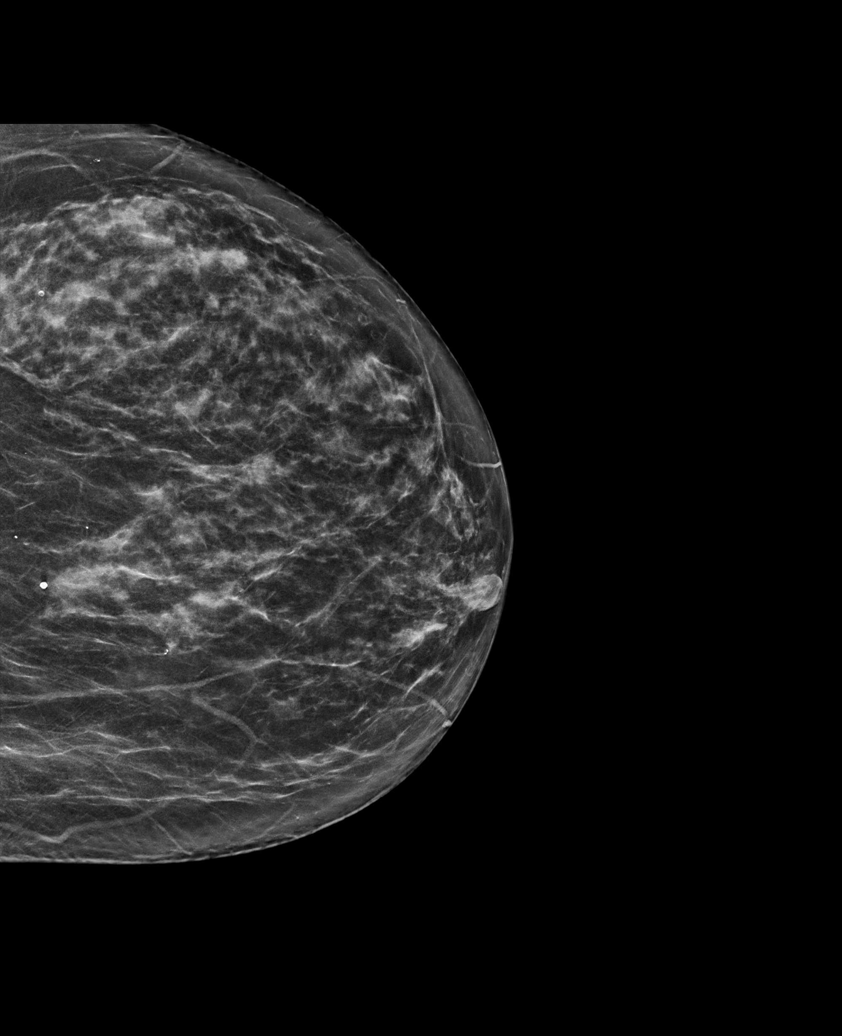

[L MLO synth-2D (1 of 2)]
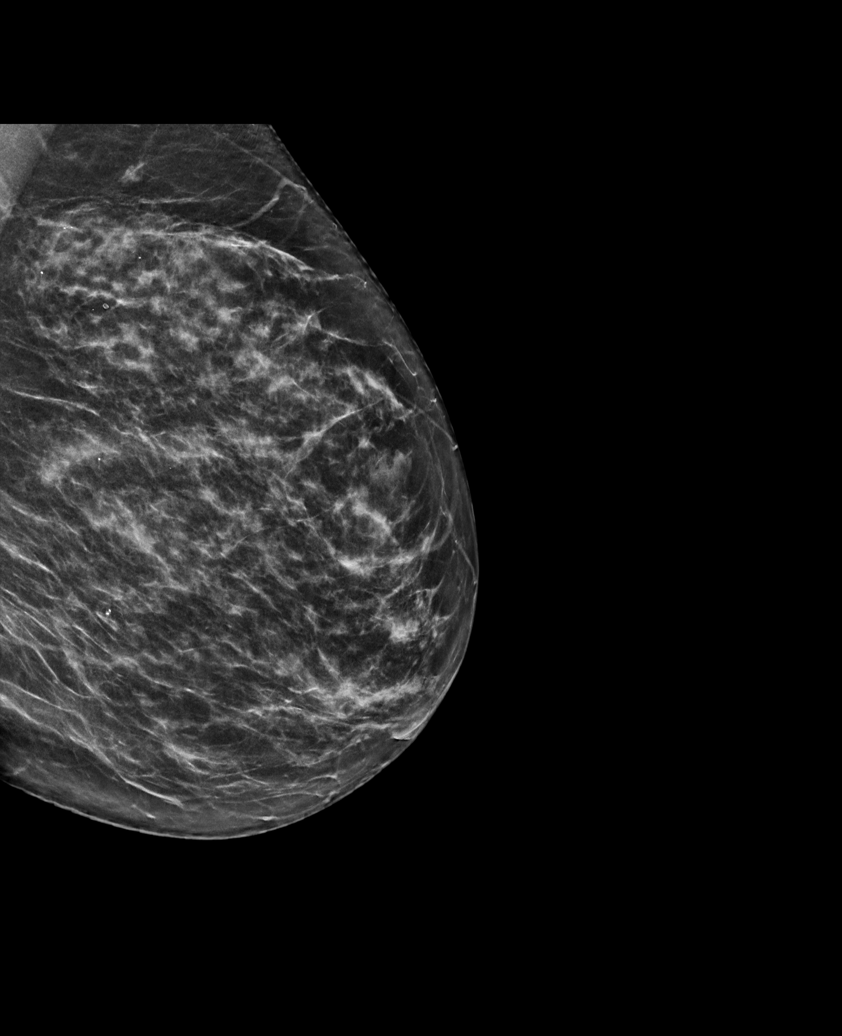

[L MLO synth-2D (2 of 2)]
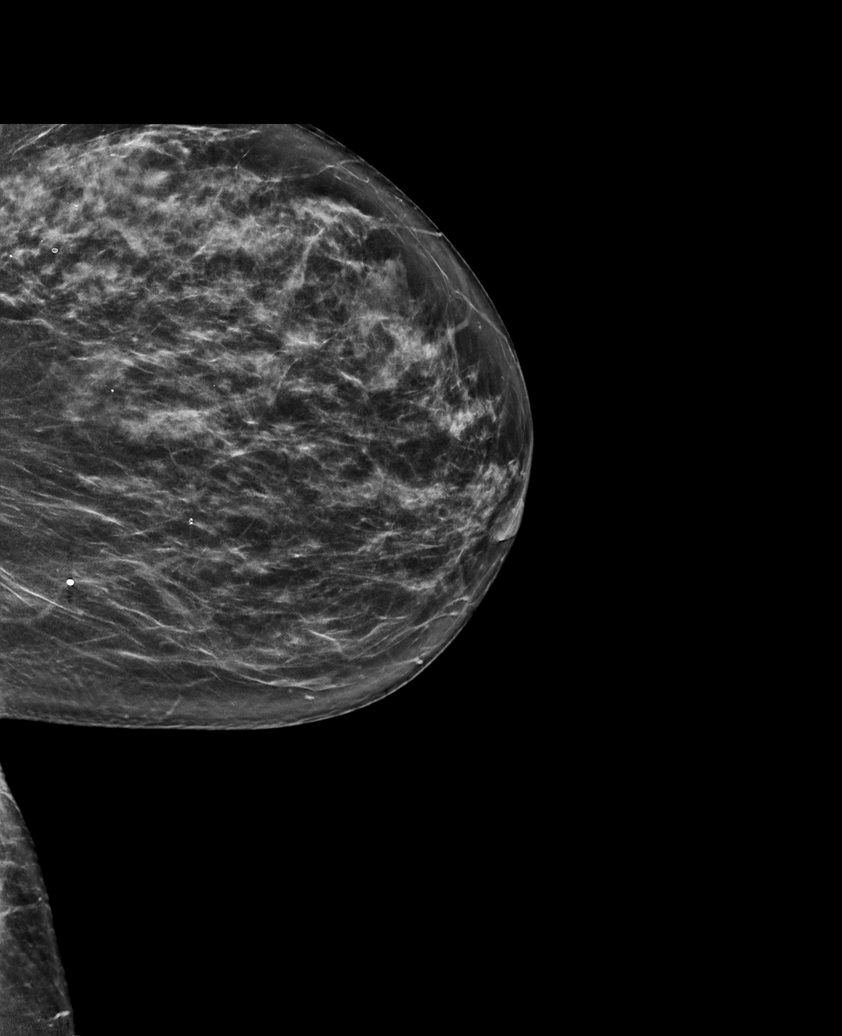

[R CC tomo · tomo slice 37/74.0]
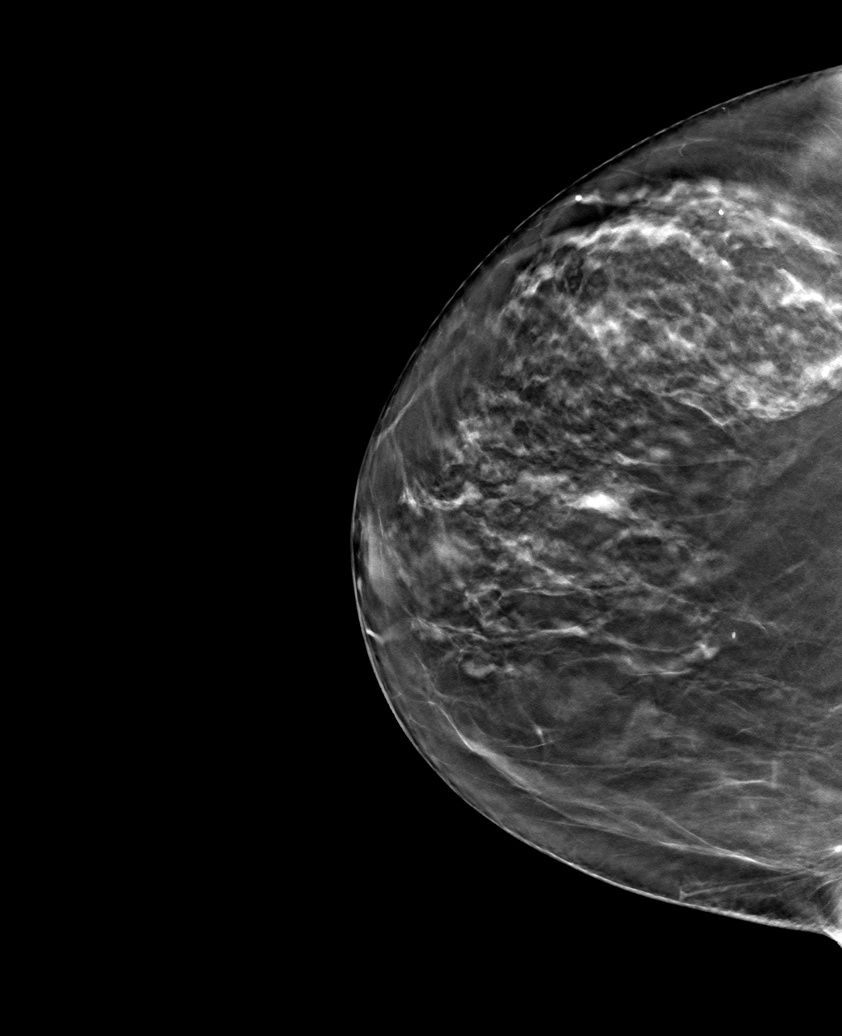

[6 of 30 positions shown; findings below may reference images not displayed]

ACR Breast Density Category c: The breast tissue is heterogeneously
dense, which may obscure small masses.
FINDINGS: There are no findings suspicious for malignancy.
IMPRESSION: No mammographic evidence of malignancy. A result letter of this
screening mammogram will be mailed directly to the patient.

RECOMMENDATION:
Screening mammogram in one year. (Code:[id number])

BI-RADS CATEGORY  1: Negative.

## 2021-08-24 ENCOUNTER — Ambulatory Visit: Payer: Medicare Other | Admitting: Physical Therapy

## 2021-08-26 ENCOUNTER — Other Ambulatory Visit: Payer: Self-pay | Admitting: Cardiology

## 2021-08-26 DIAGNOSIS — I5020 Unspecified systolic (congestive) heart failure: Secondary | ICD-10-CM

## 2021-09-22 ENCOUNTER — Other Ambulatory Visit: Payer: Self-pay

## 2021-09-22 ENCOUNTER — Encounter: Payer: Medicare Other | Admitting: Family Medicine

## 2021-09-28 ENCOUNTER — Ambulatory Visit (INDEPENDENT_AMBULATORY_CARE_PROVIDER_SITE_OTHER): Payer: Medicare Other | Admitting: Family Medicine

## 2021-09-28 ENCOUNTER — Encounter: Payer: Self-pay | Admitting: Family Medicine

## 2021-09-28 ENCOUNTER — Other Ambulatory Visit: Payer: Self-pay

## 2021-09-28 VITALS — BP 112/58 | HR 73 | Temp 98.1°F | Ht 64.0 in | Wt 193.6 lb

## 2021-09-28 DIAGNOSIS — E1159 Type 2 diabetes mellitus with other circulatory complications: Secondary | ICD-10-CM | POA: Diagnosis not present

## 2021-09-28 DIAGNOSIS — G2581 Restless legs syndrome: Secondary | ICD-10-CM

## 2021-09-28 DIAGNOSIS — E1169 Type 2 diabetes mellitus with other specified complication: Secondary | ICD-10-CM

## 2021-09-28 DIAGNOSIS — E782 Mixed hyperlipidemia: Secondary | ICD-10-CM

## 2021-09-28 DIAGNOSIS — E1142 Type 2 diabetes mellitus with diabetic polyneuropathy: Secondary | ICD-10-CM | POA: Diagnosis not present

## 2021-09-28 DIAGNOSIS — M545 Low back pain, unspecified: Secondary | ICD-10-CM

## 2021-09-28 DIAGNOSIS — I428 Other cardiomyopathies: Secondary | ICD-10-CM | POA: Diagnosis not present

## 2021-09-28 DIAGNOSIS — M23207 Derangement of unspecified meniscus due to old tear or injury, left knee: Secondary | ICD-10-CM

## 2021-09-28 DIAGNOSIS — J309 Allergic rhinitis, unspecified: Secondary | ICD-10-CM

## 2021-09-28 DIAGNOSIS — I5020 Unspecified systolic (congestive) heart failure: Secondary | ICD-10-CM | POA: Diagnosis not present

## 2021-09-28 DIAGNOSIS — M1A9XX0 Chronic gout, unspecified, without tophus (tophi): Secondary | ICD-10-CM | POA: Diagnosis not present

## 2021-09-28 DIAGNOSIS — I152 Hypertension secondary to endocrine disorders: Secondary | ICD-10-CM | POA: Diagnosis not present

## 2021-09-28 DIAGNOSIS — G8929 Other chronic pain: Secondary | ICD-10-CM

## 2021-09-28 LAB — CBC WITH DIFFERENTIAL/PLATELET
Basophils Absolute: 0.1 10*3/uL (ref 0.0–0.1)
Basophils Relative: 1 % (ref 0.0–3.0)
Eosinophils Absolute: 0.4 10*3/uL (ref 0.0–0.7)
Eosinophils Relative: 5.8 % — ABNORMAL HIGH (ref 0.0–5.0)
HCT: 38.2 % (ref 36.0–46.0)
Hemoglobin: 12.4 g/dL (ref 12.0–15.0)
Lymphocytes Relative: 25.4 % (ref 12.0–46.0)
Lymphs Abs: 1.7 10*3/uL (ref 0.7–4.0)
MCHC: 32.5 g/dL (ref 30.0–36.0)
MCV: 88.3 fl (ref 78.0–100.0)
Monocytes Absolute: 0.5 10*3/uL (ref 0.1–1.0)
Monocytes Relative: 8 % (ref 3.0–12.0)
Neutro Abs: 3.9 10*3/uL (ref 1.4–7.7)
Neutrophils Relative %: 59.8 % (ref 43.0–77.0)
Platelets: 293 10*3/uL (ref 150.0–400.0)
RBC: 4.33 Mil/uL (ref 3.87–5.11)
RDW: 14.8 % (ref 11.5–15.5)
WBC: 6.5 10*3/uL (ref 4.0–10.5)

## 2021-09-28 LAB — LIPID PANEL
Cholesterol: 111 mg/dL (ref 0–200)
HDL: 55.7 mg/dL (ref >39.00)
LDL Cholesterol: 37 mg/dL (ref 0–99)
NonHDL: 55.47
Total CHOL/HDL Ratio: 2
Triglycerides: 91 mg/dL (ref 0.0–149.0)
VLDL: 18.2 mg/dL (ref 0.0–40.0)

## 2021-09-28 LAB — URIC ACID: Uric Acid, Serum: 7 mg/dL (ref 2.4–7.0)

## 2021-09-28 LAB — HEMOGLOBIN A1C: Hgb A1c MFr Bld: 7.1 % — ABNORMAL HIGH (ref 4.6–6.5)

## 2021-09-28 LAB — COMPREHENSIVE METABOLIC PANEL
ALT: 16 U/L (ref 0–35)
AST: 20 U/L (ref 0–37)
Albumin: 4.1 g/dL (ref 3.5–5.2)
Alkaline Phosphatase: 119 U/L — ABNORMAL HIGH (ref 39–117)
BUN: 40 mg/dL — ABNORMAL HIGH (ref 6–23)
CO2: 26 mEq/L (ref 19–32)
Calcium: 10.1 mg/dL (ref 8.4–10.5)
Chloride: 107 mEq/L (ref 96–112)
Creatinine, Ser: 1.67 mg/dL — ABNORMAL HIGH (ref 0.40–1.20)
GFR: 29.73 mL/min — ABNORMAL LOW (ref >60.00)
Glucose, Bld: 91 mg/dL (ref 70–99)
Potassium: 5.4 mEq/L — ABNORMAL HIGH (ref 3.5–5.1)
Sodium: 141 mEq/L (ref 135–145)
Total Bilirubin: 0.4 mg/dL (ref 0.2–1.2)
Total Protein: 7 g/dL (ref 6.0–8.3)

## 2021-09-28 LAB — TSH: TSH: 1.38 u[IU]/mL (ref 0.35–5.50)

## 2021-09-28 MED ORDER — DICLOFENAC SODIUM 1 % EX GEL: 4.0000 g | Freq: Four times a day (QID) | CUTANEOUS | 5 refills | Status: DC | PRN

## 2021-09-28 MED ORDER — ASPIRIN EC 81 MG PO TBEC: 81.0000 mg | DELAYED_RELEASE_TABLET | ORAL | 3 refills | Status: DC

## 2021-09-28 MED ORDER — ROPINIROLE HCL 3 MG PO TABS: 3.0000 mg | ORAL_TABLET | Freq: Every day | ORAL | 3 refills | Status: DC

## 2021-09-28 MED ORDER — FLUTICASONE PROPIONATE 50 MCG/ACT NA SUSP: 1.0000 | Freq: Every day | NASAL | 11 refills | Status: DC

## 2021-09-28 NOTE — Progress Notes (Signed)
Subjective  CC:  Chief Complaint  Patient presents with   Annual Exam    Non fasting    HPI: [name] is a 76 y.o. female who presents to the office today for follow up of diabetes and problems listed above in the chief complaint.  Diabetes follow up: Her diabetic control is reported as Improved.  We adjusted medications, decrease Trulicity and nausea is much better.  She is taking long-acting insulin 20 units nightly.  Reports fasting sugars are mainly in the low 100s.  No symptoms of hypo or hyperglycemia. She denies exertional CP or SOB or symptomatic hypoglycemia. She denies foot sores or paresthesias.  Heart failure and nonobstructive cardiomyopathy: Reviewed recent cardiology notes.  Heart failure is stable.  Tachyarrhythmia is stable.  She continues on heart failure medications and beta-blocker. Hyperlipidemia: Tolerating Crestor nightly now.  Due for recheck.  Nonfasting Restless leg syndrome: Well-controlled on ropinirole.  Needs refill. Allergies: Improved on Flonase.  Still has hoarseness.  He has follow-up with ENT in April.  Started after intubation last January.  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 Complains of medial knee pain over the last week and a half.  He has a known chronic meniscal tear.  does see orthopedics.  Has had multiple steroid injections in the past.  No limping or swelling or redness.   No recent gout flares on allopurinol.  Due for recheck. Hypertension remains well controlled. Wt Readings from Last 3 Encounters:  09/28/21 193 lb 9.6 oz (87.8 kg)  08/06/21 194 lb 3.2 oz (88.1 kg)  07/19/21 192 lb 12.8 oz (87.5 kg)    BP Readings from Last 3 Encounters:  09/28/21 (!) 112/58  08/06/21 130/80  07/19/21 120/64    Assessment  1. Type 2 diabetes mellitus with peripheral neuropathy (HCC)   2. Nonischemic cardiomyopathy ([place])   3. Hypertension associated with diabetes ([place])   4. Congestive heart failure with left ventricular systolic dysfunction ([place])    5. Chronic low back pain, unspecified back pain laterality, unspecified whether sciatica present   6. Chronic gout without tophus, unspecified cause, unspecified site   7. Chronic allergic rhinitis   8. Restless leg syndrome   9. 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 Combined hyperlipidemia associated with type 2 diabetes mellitus ([place])      Plan  Diabetes: Adjusted medications and she is tolerating them well.  Recheck for controlled today with A1c.  Adjust medications as needed.  Check renal function. Cardiology: Hypertension, CHF, history of arrhythmia and nonobstructive cardiomyopathy are well controlled on current medication list.  Continue low-sodium diet.  Watch fluid restriction. Refill medications for restless leg syndrome and allergies.  Both well controlled. Check uric acid level for gout.  No recent flares Chronic hoarseness and sinusitis: Follow-up with ENT in April. Hyperlipidemia: Recheck lipids today.  Check LFTs Knee pain: Osteoarthritis and meniscal tear: Start diclofenac gel as needed.   I spent a total of 42 minutes for this patient encounter. Time spent included preparation, face-to-face counseling with the patient and coordination of care, review of chart and records, and documentation of the encounter.   Follow up: 3 mo for dm recheck. . Orders Placed This Encounter  Procedures   CBC with Differential/Platelet   Comprehensive metabolic panel   Hemoglobin A1c   Lipid panel   TSH   Uric acid   Meds ordered this encounter  Medications   rOPINIRole (REQUIP) 3 MG tablet  Sig: Take 1 tablet (3 mg total) by mouth at bedtime.    Dispense:  90 tablet    Refill:  3   aspirin EC 81 MG tablet    Sig: Take 1 tablet (81 mg total) by mouth 3 (three) times a week. Swallow whole.    Dispense:  90 tablet    Refill:  3   fluticasone (FLONASE) 50 MCG/ACT nasal spray    Sig: Place 1 spray into both nostrils daily.     Dispense:  16 g    Refill:  11   diclofenac Sodium (VOLTAREN) 1 % GEL    Sig: Apply 4 g topically 4 (four) times daily as needed (knee pain).    Dispense:  350 g    Refill:  5      Immunization History  Administered Date(s) Administered   Fluad Quad(high Dose 65+) 07/01/2020, 04/16/2021   Influenza Split 06/01/2010   Influenza, High Dose Seasonal PF 05/13/2014, 04/24/2015, 04/12/2017, 04/19/2018, 04/24/2019, 06/16/2020   Influenza, Seasonal, Injecte, Preservative Fre 04/09/2013, 05/03/2016   Influenza,inj,Quad PF,6+ Mos 05/01/2017   Influenza-Unspecified 04/03/2012   Moderna Sars-Covid-2 Vaccination 01/15/2021, 02/12/2021   Pneumococcal Conjugate-13 05/13/2014   Pneumococcal Polysaccharide-23 02/15/2011   Td 08/01/1998   Tdap 05/01/2004   Zoster Recombinat (Shingrix) 04/24/2019, 08/13/2019   Zoster, Live 02/15/2011    Diabetes Related Lab Review: Lab Results  Component Value Date   HGBA1C 7.7 (A) 06/09/2021   HGBA1C 10.5 (A) 04/16/2021   HGBA1C 9.6 (A) 01/14/2021    No results found for: [facility] Lab Results  Component Value Date   CREATININE 1.13 (H) 12/11/2020   BUN 25 (H) 12/11/2020   NA 137 12/11/2020   K 4.2 12/11/2020   CL 103 12/11/2020   CO2 24 12/11/2020   No results found for: CHOL No results found for: HDL No results found for: [facility] Lab Results  Component Value Date   TRIG 194 (H) 08/13/2020   TRIG 357 (H) 08/12/2020   TRIG 260 (H) 08/10/2020   No results found for: CHOLHDL No results found for: LDLDIRECT The ASCVD Risk score (Arnett DK, et al., 2019) failed to calculate for the following reasons:   Cannot find a previous HDL lab I have reviewed the [place], Fam and Soc history. Patient Active Problem List   Diagnosis Date Noted   Nonischemic cardiomyopathy ([place]) 10/06/2020    Priority: High   Congestive heart failure with left ventricular systolic dysfunction ([place]) 08/24/2020    Priority: High   Type 2 diabetes mellitus with  peripheral neuropathy ([place]) 04/09/2013    Priority: High    New onset 04/2013, doesn't tolerate 1000mg  metformin due to GI upset.    Obesity 05/25/2011    Priority: High   Hypertension associated with diabetes ([place]) 11/08/2010    Priority: High   Gout 07/19/2021    Priority: Medium     On allopurinol    Primary osteoarthritis of left shoulder 07/29/2020    Priority: Medium    DDD (degenerative disc disease), cervical 04/30/2020    Priority: Medium    Osteoarthritis of left knee 11/05/2018    Priority: Medium    Chronic low back pain 09/05/2017    Priority: Medium    Degenerative lumbar disc 06/04/2015    Priority: Medium    S/P lumbar laminectomy 06/04/2015    Priority: Medium    Restless leg syndrome 05/13/2014    Priority: Medium    Spinal stenosis of lumbar region without neurogenic claudication 09/12/2012  Priority: Medium     Formatting of this note might be different from the original. Right L4/5    GERD (gastroesophageal reflux disease) 05/25/2011    Priority: Medium     Chronic recurrent heartburn symptoms for 3 or 4 years without weight loss, bleeding or dysphagia. Bonds well to daily PPI.Gatha Mayer, MD, [facility]     Mitral valve prolapse 10/10/2008    Priority: Medium     Formatting of this note might be different from the original. Prolapsing Mitral Valve Leaflet Syndrome - echo at [facility] 2008, palpitations are symptomatic    S/P shoulder replacement, right 12/14/2020    Priority: Low   Chronic pansinusitis 10/04/2017    Priority: Low   Hemorrhoids, internal, with bleeding 05/25/2011    Priority: Low    Visible on 2005 colonoscopy and endoscopy 05/25/2011.    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 She has never used smokeless tobacco. She  reports that she does not drink alcohol and does not use drugs.  Review of Systems: Ophthalmic: negative for eye pain, loss of vision or double vision Cardiovascular: negative for chest pain Respiratory: negative for SOB or persistent cough Gastrointestinal: negative for abdominal pain Genitourinary: negative for dysuria or gross hematuria MSK: negative for foot lesions Neurologic: negative for weakness or gait disturbance  Objective  Vitals: BP (!) 112/58    Pulse 73    Temp 98.1 F (36.7 C) (Temporal)    Ht 5\' 4"  (1.626 m)    Wt 193 lb 9.6 oz (87.8 kg)    SpO2 99%    BMI 33.23 kg/m  General: well appearing, no acute distress  Psych:  Alert and oriented, normal mood and affect HEENT:  Normocephalic, atraumatic, moist mucous membranes, supple neck  Cardiovascular:  Nl S1 and S2, RRR without murmur, gallop or rub. no edema Respiratory:  Good breath sounds bilaterally, CTAB with normal effort, no rales Left knee: no warmth redness, medial tenderness is present.  Left click present.    Diabetic education: ongoing education regarding chronic disease management for diabetes was given today. We continue to reinforce the ABC's of diabetic management: A1c (<7 or 8 dependent upon patient), tight blood pressure control, and cholesterol management with goal LDL < 100 minimally. We discuss diet strategies, exercise recommendations, medication options and possible side effects. At each visit, we review recommended immunizations and preventive care recommendations for diabetics and stress that good diabetic control can prevent other problems. See below for this patient's data.   Commons side effects, risks, benefits, and alternatives for medications and treatment plan prescribed today were discussed, and the patient expressed understanding of the given instructions. Patient is instructed to call or message via MyChart if he/she has any questions or concerns regarding our treatment plan. No barriers to  understanding were identified. We discussed Red Flag symptoms and signs in detail. Patient expressed understanding regarding what to do in case of urgent or emergency type symptoms.  Medication list was reconciled, printed and provided to the patient in AVS. Patient instructions and summary information was reviewed with the patient as documented in the AVS. This note was prepared with assistance of Dragon voice recognition software. Occasional wrong-word or sound-a-like substitutions may have occurred due to the inherent limitations of voice recognition software  This visit occurred during the SARS-CoV-2 public health emergency.  Safety protocols were in place, including screening questions prior to the visit, additional usage of staff PPE, and extensive cleaning of exam room while observing appropriate contact time as indicated for disinfecting solutions.

## 2021-09-28 NOTE — Patient Instructions (Signed)
Please return in 3 months for diabetes follow up   I will release your lab results to you on your MyChart account with further instructions. You may see the results before I do, but when I review them I will send you a message with my report or have my assistant call you if things need to be discussed. Please reply to my message with any questions. Thank you!   If you have any questions or concerns, please don't hesitate to send me a message via MyChart or call the office at [phone]. Thank you for visiting with us today! It's our pleasure caring for you.  

## 2021-09-30 ENCOUNTER — Other Ambulatory Visit: Payer: Self-pay

## 2021-09-30 MED ORDER — ENTRESTO 49-51 MG PO TABS: 1.0000 | ORAL_TABLET | Freq: Two times a day (BID) | ORAL | 2 refills | Status: DC

## 2021-10-03 NOTE — Progress Notes (Signed)
Please call patient: I have reviewed his/her lab results. Potassium is high: please stop the potassium supplement (please clarify how she is taking it: daily or only with lasix).  Need to recheck bmp with lab visit in 1 week. Please schedule. Dx: hyperkalemia and ckd.  Diabetes is better. Kidney function is a little worse as well. Will monitor  No other medicine changes needed at this time.

## 2021-10-06 ENCOUNTER — Other Ambulatory Visit: Payer: Self-pay | Admitting: *Deleted

## 2021-10-06 MED ORDER — SPIRONOLACTONE 25 MG PO TABS: 12.5000 mg | ORAL_TABLET | Freq: Every day | ORAL | 3 refills | Status: DC

## 2021-10-06 MED ORDER — POTASSIUM CHLORIDE ER 10 MEQ PO TBCR: 10.0000 meq | EXTENDED_RELEASE_TABLET | Freq: Every day | ORAL | 3 refills | Status: DC

## 2021-10-08 ENCOUNTER — Other Ambulatory Visit: Payer: Self-pay | Admitting: Family Medicine

## 2021-10-08 DIAGNOSIS — Z1231 Encounter for screening mammogram for malignant neoplasm of breast: Secondary | ICD-10-CM

## 2021-10-14 ENCOUNTER — Telehealth: Payer: Self-pay | Admitting: Family Medicine

## 2021-10-14 NOTE — Telephone Encounter (Signed)
Pt calling back for lab results

## 2021-10-14 NOTE — Telephone Encounter (Signed)
FYI: ? ?Lab result note give to pt. She clarifies taking Potassium with Lasix. And verbalizes understanding to stop Potassium. She was transferred to the front desk to schedule 1 week lab.  ?

## 2021-10-20 ENCOUNTER — Other Ambulatory Visit: Payer: Self-pay

## 2021-10-20 MED ORDER — CARVEDILOL 3.125 MG PO TABS: 3.1250 mg | ORAL_TABLET | Freq: Two times a day (BID) | ORAL | 3 refills | Status: DC

## 2021-10-21 ENCOUNTER — Other Ambulatory Visit: Payer: Self-pay

## 2021-10-21 ENCOUNTER — Other Ambulatory Visit (INDEPENDENT_AMBULATORY_CARE_PROVIDER_SITE_OTHER): Payer: Medicare Other

## 2021-10-21 DIAGNOSIS — E875 Hyperkalemia: Secondary | ICD-10-CM | POA: Diagnosis not present

## 2021-10-21 DIAGNOSIS — N189 Chronic kidney disease, unspecified: Secondary | ICD-10-CM | POA: Diagnosis not present

## 2021-10-21 LAB — BASIC METABOLIC PANEL
BUN: 28 mg/dL — ABNORMAL HIGH (ref 6–23)
CO2: 29 mEq/L (ref 19–32)
Calcium: 9.7 mg/dL (ref 8.4–10.5)
Chloride: 104 mEq/L (ref 96–112)
Creatinine, Ser: 1.25 mg/dL — ABNORMAL HIGH (ref 0.40–1.20)
GFR: 42.07 mL/min — ABNORMAL LOW (ref >60.00)
Glucose, Bld: 112 mg/dL — ABNORMAL HIGH (ref 70–99)
Potassium: 4.6 mEq/L (ref 3.5–5.1)
Sodium: 141 mEq/L (ref 135–145)

## 2021-10-22 ENCOUNTER — Other Ambulatory Visit: Payer: Self-pay | Admitting: Family Medicine

## 2021-10-22 ENCOUNTER — Other Ambulatory Visit: Payer: Self-pay

## 2021-10-22 ENCOUNTER — Ambulatory Visit
Admission: RE | Admit: 2021-10-22 | Discharge: 2021-10-22 | Disposition: A | Discharge status: Home / Self Care | Payer: Medicare Other | Source: Ambulatory Visit | Attending: Family Medicine | Admitting: Family Medicine

## 2021-10-22 DIAGNOSIS — Z1231 Encounter for screening mammogram for malignant neoplasm of breast: Secondary | ICD-10-CM

## 2021-10-25 ENCOUNTER — Other Ambulatory Visit: Payer: Self-pay | Admitting: Family Medicine

## 2021-10-25 DIAGNOSIS — R928 Other abnormal and inconclusive findings on diagnostic imaging of breast: Secondary | ICD-10-CM

## 2021-11-02 NOTE — Progress Notes (Deleted)
?Cardiology Office Note:   ? ?Date:  11/02/2021  ? ?ID:  [name], DOB 1945-10-22, MRN [id number] ? ?PCP:  Leamon Arnt, MD ?  ?[place]  ?Cardiologist:  Freada Bergeron, MD  ?Advanced Practice Provider:  No care team member to display ?Electrophysiologist:  None  ? ? ?Referring MD: Leamon Arnt, MD  ? ? ?History of Present Illness:   ? ?[name] is a 76 y.o. 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 She now presents to clinic for follow-up. ?  ?Patient was diagnosed with new onset cardiomyopathy in 08/2020 when she presented with SOB found to have COVID at that time. TTE with LVEF 20-25%. Was initially stable and discharged home but returned to [facility] on 08/10/2020 with progressive shortness of breath and LE edema requiring ICU admission and intubation. She was aggressively diuresed and extubated and evenutally weaned to RA. Initial weight was 205lbs and she was diuresed to 187lbs. She was discharged home on lasix '40mg'$  daily. ?  ?After discharge, we arranged for coronary angiography on 09/07/20 for work-up of newly diagnosed HFrEF which revealed no significant obstructive disease. She returned to clinic on 09/21/20 where she felt overall improved. Had DOE with stairs. Wt was stable and blood pressure was well controlled. Repeat TTE 10/15/20 with mild improvement in LVEF to 30-35%, G2DD, elevated LAP, normal RA, mildly dilated LA, mild MR. Cardiac monitor that was placed for frequent ventricular ectopy revealed several runs of NSVT and frequent PVCs but no sustained arrhythmias or Afib. ? ?During visit on 10/20/20, she was overall feeling better. She was euvolemic. We increased her entresto and started spironolactone. Echo 01/27/21 LVEF 45%, RV normal  size and function, trivial MR.  ? ?Last seen in clinic 08/2021 where she was doing well from a CV standpoint.  ? ?Today, *** ? ?Past Medical History:  ?Diagnosis Date  ? Arthritis   ? Chronic back pain   ? buldging disc,scoliosis,arthritis  ? Congestive heart failure with left ventricular systolic dysfunction ([place]) 08/24/2020  ? Diabetes mellitus without complication ([place])   ? takes Trulicity,Jardiance,and Metformin daily.Average fasting blood sugar runs around130  ? GERD (gastroesophageal reflux disease)   ? takes Omeprazole daily  ? Gout 07/19/2021  ? On allopurinol  ? History of bronchitis > 8 yrs ago  ? History of shingles   ? HTN (hypertension)   ? takes Amlodipine and Micardis daily  ? Hx of colonic polyps   ? benign  ? Internal and external hemorrhoids without complication   ? Joint pain   ? Mitral valve prolapse   ? Nocturia   ? PONV (postoperative nausea and vomiting)   ? when gets injections in joints gets hives.Betadine rash  ? Restless leg syndrome   ? takes Requip at bedtime  ? Seasonal allergies   ? takes Claritin daily as needed  ? Urinary frequency   ? Uterine fibroid   ? ? ?Past Surgical History:  ?Procedure Laterality Date  ? BUNIONECTOMY Bilateral   ? COLONOSCOPY  07/12/2004  ? diverticulosis, internal and external hemorrhoids  ? COLONOSCOPY    ? FINGER ARTHROSCOPY WITH CARPOMETACARPEL [facility]) ARTHROPLASTY Right 09/28/2015  ? Procedure: RIGHT THUMB TRAPEZIUM EXCISION WITH CARPOMETACARPEL ([place]) ARTHROPLASTY AND TENDON TRANSFER;  Surgeon: Iran Planas,  MD;  Location: [place];  Service: Orthopedics;  Laterality: Right;  ? HEMORRHOID SURGERY    ? almost 40 yrs ago  ? HYSTEROSCOPY WITH D & C  08/23/2000  ? and resectoscopic myomectomy  ? [place] SURGERY  03/16/2005  ? LUMBAR EPIDURAL INJECTION    ? PLANTAR FASCIA RELEASE Left 02/03/2010  ? and torn tendon  ? REVERSE SHOULDER ARTHROPLASTY Right 12/14/2020  ? Procedure: REVERSE SHOULDER ARTHROPLASTY;  Surgeon: Netta Cedars, MD;  [facility];  Service:  Orthopedics;  Laterality: Right;  ? RIGHT/LEFT HEART CATH AND CORONARY ANGIOGRAPHY N/A 09/07/2020  ? Procedure: RIGHT/LEFT HEART CATH AND CORONARY ANGIOGRAPHY;  Surgeon: Nelva Bush, MD;  Location: [place] [facility];  Service: Cardiovascular;  Laterality: N/A;  ? TENDON TRANSFER Right 09/28/2015  ? Procedure: TENDON TRANSFER;  Surgeon: Iran Planas, MD;  Location: [place];  Service: Orthopedics;  Laterality: Right;  ? TONSILLECTOMY    ? TUBAL LIGATION    ? WRIST SURGERY    ? left, removal of cyst  ? ? ?Current Medications: ?No outpatient medications have been marked as taking for the 11/05/21 encounter (Appointment) with Freada Bergeron, MD.  ?  ? ?Allergies:   Amlodipine, Depo-medrol [methylprednisolone], Gabapentin, Levothyroxine, Sitagliptin, Ace inhibitors, Betadine [povidone iodine], Codeine, Cortizone-10 [hydrocortisone], Tetracyclines & related, and Dexamethasone  ? ?Social History  ? ?Socioeconomic History  ? Marital status: Married  ?  Spouse name: Not on file  ? Number of children: 3  ? Years of education: Not on file  ? Highest education level: Not on file  ?Occupational History  ? Occupation: Retired   ?Tobacco Use  ? Smoking status: Never  ? Smokeless tobacco: Never  ?Vaping Use  ? Vaping Use: Never used  ?Substance and Sexual Activity  ? Alcohol use: No  ?  Alcohol/week: 0.0 standard drinks  ? Drug use: No  ? Sexual activity: Yes  ?Other Topics Concern  ? Not on file  ?Social History Narrative  ? 1 caffeine drinks daily  ?   ?   ? ?Social Determinants of Health  ? ?Financial Resource Strain: Low Risk   ? Difficulty of Paying Living Expenses: Not hard at all  ?Food Insecurity: No Food Insecurity  ? Worried About Charity fundraiser in the Last Year: Never true  ? Ran Out of Food in the Last Year: Never true  ?Transportation Needs: No Transportation Needs  ? Lack of Transportation (Medical): No  ? Lack of Transportation (Non-Medical): No  ?Physical Activity: Inactive  ? Days of Exercise per Week: 0  days  ? Minutes of Exercise per Session: 0 min  ?Stress: No Stress Concern Present  ? Feeling of Stress : Not at all  ?Social Connections: Moderately Integrated  ? Frequency of Communication with Friends and Family: Once a week  ? Frequency of Social Gatherings with Friends and Family: Twice a week  ? Attends Religious Services: More than 4 times per year  ? Active Member of Clubs or Organizations: No  ? Attends Archivist Meetings: Never  ? Marital Status: Married  ?  ? ?Family History: ?The patient's family history includes Pancreatic cancer in her father. There is no history of Colon cancer or Breast cancer. ? ?ROS:   ?Please see the history of present illness.    ?All other systems reviewed and are negative. ? ?EKGs/Labs/Other Studies Reviewed:   ? ?The following studies were reviewed today: ?TTE 01/26/21 ?1. Left ventricular ejection fraction, by visual estimation, is  45%. The  ?left ventricle has mildly decreased function. The average left ventricular  ?global longitudinal strain is -18.5 % (with resoanable trace and only  ?slight foreshortening) suggestive  ?of near normal LV function. The left ventricle demonstrates global  ?hypokinesis, though anterolateral walls and inferolateral walls  ?hypokinesis is most prominent. Left ventricular diastolic parameters are  ?indeterminate in this study.  ? 2. Right ventricular systolic function is normal. The right ventricular  ?size is normal. Tricuspid regurgitation signal is inadequate for assessing  ?PA pressure.  ? 3. The mitral valve is grossly normal. Trivial mitral valve  ?regurgitation. No evidence of mitral stenosis.  ? 4. The aortic valve is tricuspid. Aortic valve regurgitation is not  ?visualized. No aortic stenosis is present.  ? ?TTE 10/15/20: ?1. Left ventricular ejection fraction, by estimation, is 30 to 35%. The  ?left ventricle has moderately decreased function. The left ventricle  ?demonstrates global hypokinesis. Left ventricular diastolic  parameters are  ?consistent with Grade II diastolic  ?dysfunction (pseudonormalization). Elevated left atrial pressure. The E/e'  ?is 22.0.  ? 2. Right ventricular systolic function is normal. The right ven

## 2021-11-05 ENCOUNTER — Ambulatory Visit (INDEPENDENT_AMBULATORY_CARE_PROVIDER_SITE_OTHER): Payer: Medicare Other | Admitting: Cardiology

## 2021-11-05 ENCOUNTER — Encounter: Payer: Self-pay | Admitting: Cardiology

## 2021-11-05 VITALS — BP 132/70 | HR 77 | Ht 64.0 in | Wt 197.0 lb

## 2021-11-05 DIAGNOSIS — I428 Other cardiomyopathies: Secondary | ICD-10-CM | POA: Diagnosis not present

## 2021-11-05 DIAGNOSIS — I5020 Unspecified systolic (congestive) heart failure: Secondary | ICD-10-CM | POA: Diagnosis not present

## 2021-11-05 DIAGNOSIS — E1159 Type 2 diabetes mellitus with other circulatory complications: Secondary | ICD-10-CM | POA: Diagnosis not present

## 2021-11-05 DIAGNOSIS — I493 Ventricular premature depolarization: Secondary | ICD-10-CM

## 2021-11-05 DIAGNOSIS — I4729 Other ventricular tachycardia: Secondary | ICD-10-CM

## 2021-11-05 DIAGNOSIS — I152 Hypertension secondary to endocrine disorders: Secondary | ICD-10-CM

## 2021-11-05 DIAGNOSIS — E119 Type 2 diabetes mellitus without complications: Secondary | ICD-10-CM

## 2021-11-05 DIAGNOSIS — I2510 Atherosclerotic heart disease of native coronary artery without angina pectoris: Secondary | ICD-10-CM

## 2021-11-05 DIAGNOSIS — N1831 Chronic kidney disease, stage 3a: Secondary | ICD-10-CM

## 2021-11-05 MED ORDER — CARVEDILOL 6.25 MG PO TABS: 6.2500 mg | ORAL_TABLET | Freq: Two times a day (BID) | ORAL | 3 refills | Status: DC

## 2021-11-05 NOTE — Patient Instructions (Signed)
Medication Instructions:  ? ?INCREASE YOUR CARVEDILOL TO 6.25 MG BY MOUTH TWICE DAILY ? ?*If you need a refill on your cardiac medications before your next appointment, please call your pharmacy* ? ? ?Testing/Procedures: ? ?Your physician has requested that you have an LIMITED echocardiogram. Echocardiography is a painless test that uses sound waves to create images of your heart. It provides your doctor with information about the size and shape of your heart and how well your heart?s chambers and valves are working. This procedure takes approximately one hour. There are no restrictions for this procedure. PLEASE SCHEDULE THIS TO BE DONE IN June 2023 PER DR. Johney Frame ? ? ?Follow-Up: ?At [facility], you and your health needs are our priority.  As part of our continuing mission to provide you with exceptional heart care, we have created designated Provider Care Teams.  These Care Teams include your primary Cardiologist (physician) and Advanced Practice Providers (APPs -  Physician Assistants and Nurse Practitioners) who all work together to provide you with the care you need, when you need it. ? ?We recommend signing up for the patient portal called "MyChart".  Sign up information is provided on this After Visit Summary.  MyChart is used to connect with patients for Virtual Visits (Telemedicine).  Patients are able to view lab/test results, encounter notes, upcoming appointments, etc.  Non-urgent messages can be sent to your provider as well.   ?To learn more about what you can do with MyChart, go to [email or web address].   ? ?Your next appointment:   ?6 month(s) ? ?The format for your next appointment:   ?In Person ? ?Provider:   ?Freada Bergeron, MD { ? ? ? ?

## 2021-11-05 NOTE — Progress Notes (Signed)
?Cardiology Office Note:   ? ?Date:  11/05/2021  ? ?ID:  [name], DOB 02-11-1946, MRN [id number] ? ?PCP:  Kristen Arnt, MD ?  ?[place]  ?Cardiologist:  Kristen Bergeron, MD  ?Advanced Practice Provider:  No care team member to display ?Electrophysiologist:  None  ? ? ?Referring MD: Kristen Arnt, MD  ? ? ?History of Present Illness:   ? ?[name] is a 76 y.o. 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 She now presents to clinic for follow-up. ?  ?Patient was diagnosed with new onset cardiomyopathy in 08/2020 when she presented with SOB found to have COVID at that time. TTE with LVEF 20-25%. Was initially stable and discharged home but returned to [facility] on 08/10/2020 with progressive shortness of breath and LE edema requiring ICU admission and intubation. She was aggressively diuresed and extubated and evenutally weaned to RA. Initial weight was 205lbs and she was diuresed to 187lbs. She was discharged home on lasix '40mg'$  daily. ?  ?After discharge, we arranged for coronary angiography on 09/07/20 for work-up of newly diagnosed HFrEF which revealed no significant obstructive disease. She returned to clinic on 09/21/20 where she felt overall improved. Had DOE with stairs. Wt was stable and blood pressure was well controlled. Repeat TTE 10/15/20 with mild improvement in LVEF to 30-35%, G2DD, elevated LAP, normal RA, mildly dilated LA, mild MR. Cardiac monitor that was placed for frequent ventricular ectopy revealed several runs of NSVT and frequent PVCs but no sustained arrhythmias or Afib. ? ?During visit on 10/20/20, she was overall feeling better. She was euvolemic. We increased her entresto and started spironolactone. Echo 01/27/21 LVEF 45%, RV normal  size and function, trivial MR.  ? ?Last seen in clinic 08/2021 where she was doing well from a CV standpoint.  ? ?Today, she reports she is doing well. Has been taking all medications as prescribed. No chest pain, SOB, orthopnea, lightheadedness or dizziness. LE edema is well controlled with lasix. Has occasional palpitations when she lays down at night.   ? ?She checks her blood pressure at home and systolic is often in the 322'G. ? ?She reports she is in a "donut hole" at this time but is still taking all of her medications. [name] is now 185$, farxiga is 145$.  ? ?She denies chest pain, palpitations, lightheadedness, headaches, syncope, or PND. ? ?Past Medical History:  ?Diagnosis Date  ? Arthritis   ? Chronic back pain   ? buldging disc,scoliosis,arthritis  ? Congestive heart failure with left ventricular systolic dysfunction ([place]) 08/24/2020  ? Diabetes mellitus without complication ([place])   ? takes Trulicity,Jardiance,and Metformin daily.Average fasting blood sugar runs around130  ? GERD (gastroesophageal reflux disease)   ? takes Omeprazole daily  ? Gout 07/19/2021  ? On allopurinol  ? History of bronchitis > 8 yrs ago  ? History of shingles   ? HTN (hypertension)   ? takes Amlodipine and Micardis daily  ? Hx of colonic polyps   ? benign  ? Internal and external hemorrhoids without complication   ? Joint pain   ? Mitral valve prolapse   ? Nocturia   ? PONV (postoperative nausea and vomiting)   ? when gets injections in joints gets hives.Betadine rash  ?  Restless leg syndrome   ? takes Requip at bedtime  ? Seasonal allergies   ? takes Claritin daily as needed  ? Urinary frequency   ? Uterine fibroid   ? ? ?Past Surgical History:  ?Procedure Laterality Date  ? BUNIONECTOMY Bilateral   ? COLONOSCOPY  07/12/2004  ? diverticulosis, internal and external hemorrhoids  ? COLONOSCOPY    ? FINGER ARTHROSCOPY WITH CARPOMETACARPEL [facility]) ARTHROPLASTY Right 09/28/2015  ? Procedure: RIGHT THUMB TRAPEZIUM EXCISION WITH  CARPOMETACARPEL ([place]) ARTHROPLASTY AND TENDON TRANSFER;  Surgeon: Kristen Planas, MD;  Location: [place];  Service: Orthopedics;  Laterality: Right;  ? HEMORRHOID SURGERY    ? almost 40 yrs ago  ? HYSTEROSCOPY WITH D & C  08/23/2000  ? and resectoscopic myomectomy  ? [place] SURGERY  03/16/2005  ? LUMBAR EPIDURAL INJECTION    ? PLANTAR FASCIA RELEASE Left 02/03/2010  ? and torn tendon  ? REVERSE SHOULDER ARTHROPLASTY Right 12/14/2020  ? Procedure: REVERSE SHOULDER ARTHROPLASTY;  Surgeon: Kristen Cedars, MD;  [facility];  Service: Orthopedics;  Laterality: Right;  ? RIGHT/LEFT HEART CATH AND CORONARY ANGIOGRAPHY N/A 09/07/2020  ? Procedure: RIGHT/LEFT HEART CATH AND CORONARY ANGIOGRAPHY;  Surgeon: Kristen Bush, MD;  Location: [place] [facility];  Service: Cardiovascular;  Laterality: N/A;  ? TENDON TRANSFER Right 09/28/2015  ? Procedure: TENDON TRANSFER;  Surgeon: Kristen Planas, MD;  Location: [place];  Service: Orthopedics;  Laterality: Right;  ? TONSILLECTOMY    ? TUBAL LIGATION    ? WRIST SURGERY    ? left, removal of cyst  ? ? ?Current Medications: ?Current Meds  ?Medication Sig  ? allopurinol (ZYLOPRIM) 100 MG tablet Take 1 tablet (100 mg total) by mouth daily.  ? aspirin EC 81 MG tablet Take 1 tablet (81 mg total) by mouth 3 (three) times a week. Swallow whole.  ? carvedilol (COREG) 6.25 MG tablet Take 1 tablet (6.25 mg total) by mouth 2 (two) times daily.  ? cholecalciferol (VITAMIN D) 1000 UNITS tablet Take 1,000 Units by mouth daily.  ? cycloSPORINE (RESTASIS) 0.05 % ophthalmic emulsion Place 1 drop into both eyes 2 (two) times daily.  ? dapagliflozin propanediol (FARXIGA) 10 MG TABS tablet Take 1 tablet (10 mg total) by mouth daily before breakfast.  ? diclofenac Sodium (VOLTAREN) 1 % GEL Apply 4 g topically 4 (four) times daily as needed (knee pain).  ? Dulaglutide (TRULICITY) 1.5 LE/7.5TZ SOPN Inject 0.75 mg into the skin once a week.  ? fluticasone (FLONASE) 50 MCG/ACT nasal spray Place 1 spray into both  nostrils daily.  ? furosemide (LASIX) 20 MG tablet Take 20 mg by mouth as directed. Monday,wed, Friday and sat only.  ? Magnesium Oxide 400 MG CAPS Take 1 capsule (400 mg total) by mouth daily.  ? metFORMIN (GLUCOPHAGE) 500 MG tablet Take 1 tablet (500 mg total) by mouth 2 (two) times daily with a meal.  ? montelukast (SINGULAIR) 10 MG tablet Take 1 tablet by mouth daily.  ? Multiple Vitamin (MULTIVITAMIN WITH MINERALS) TABS tablet Take 1 tablet by mouth daily.  ? omeprazole (PRILOSEC) 40 MG capsule Take 1 capsule (40 mg total) by mouth daily.  ? pregabalin (LYRICA) 100 MG capsule Take 1 capsule by mouth twice daily  ? rOPINIRole (REQUIP) 3 MG tablet Take 1 tablet (3 mg total) by mouth at bedtime.  ? rosuvastatin (CRESTOR) 20 MG tablet Take 1 tablet (20 mg total) by mouth daily.  ? sacubitril-valsartan (ENTRESTO) 49-51 MG Take 1 tablet by mouth 2 (  two) times daily.  ? spironolactone (ALDACTONE) 25 MG tablet Take 0.5 tablets (12.5 mg total) by mouth daily.  ? [DISCONTINUED] carvedilol (COREG) 3.125 MG tablet Take 1 tablet (3.125 mg total) by mouth 2 (two) times daily.  ?  ? ?Allergies:   Amlodipine, Depo-medrol [methylprednisolone], Gabapentin, Levothyroxine, Sitagliptin, Ace inhibitors, Betadine [povidone iodine], Codeine, Cortizone-10 [hydrocortisone], Tetracyclines & related, and Dexamethasone  ? ?Social History  ? ?Socioeconomic History  ? Marital status: Married  ?  Spouse name: Not on file  ? Number of children: 3  ? Years of education: Not on file  ? Highest education level: Not on file  ?Occupational History  ? Occupation: Retired   ?Tobacco Use  ? Smoking status: Never  ? Smokeless tobacco: Never  ?Vaping Use  ? Vaping Use: Never used  ?Substance and Sexual Activity  ? Alcohol use: No  ?  Alcohol/week: 0.0 standard drinks  ? Drug use: No  ? Sexual activity: Yes  ?Other Topics Concern  ? Not on file  ?Social History Narrative  ? 1 caffeine drinks daily  ?   ?   ? ?Social Determinants of Health  ? ?Financial  Resource Strain: Low Risk   ? Difficulty of Paying Living Expenses: Not hard at all  ?Food Insecurity: No Food Insecurity  ? Worried About Charity fundraiser in the Last Year: Never true  ? Ran Out of Food in

## 2021-11-10 ENCOUNTER — Ambulatory Visit: Payer: Medicare Other

## 2021-11-10 ENCOUNTER — Ambulatory Visit
Admission: RE | Admit: 2021-11-10 | Discharge: 2021-11-10 | Disposition: A | Discharge status: Home / Self Care | Payer: Medicare Other | Source: Ambulatory Visit | Attending: Family Medicine | Admitting: Family Medicine

## 2021-11-10 ENCOUNTER — Telehealth: Payer: Self-pay

## 2021-11-10 DIAGNOSIS — R928 Other abnormal and inconclusive findings on diagnostic imaging of breast: Secondary | ICD-10-CM

## 2021-11-10 NOTE — Telephone Encounter (Signed)
Pt brought Pt Asst paperwork to our office. It has been completed and signed with all documentation and has been faxed to [employer]. ?

## 2021-11-10 NOTE — Telephone Encounter (Signed)
Pt brought Pt Asst paperwork to our office. It has been completed and signed with all documentation and has been faxed to [employer]. ?

## 2021-11-11 ENCOUNTER — Other Ambulatory Visit: Payer: Self-pay

## 2021-11-11 ENCOUNTER — Other Ambulatory Visit: Payer: Self-pay | Admitting: Family Medicine

## 2021-11-11 MED ORDER — ENTRESTO 49-51 MG PO TABS: 1.0000 | ORAL_TABLET | Freq: Two times a day (BID) | ORAL | 2 refills | Status: DC

## 2021-11-11 NOTE — Telephone Encounter (Signed)
Pt has been approved for pt asst at no cost until 07/31/2022. ?Rx has been faxed to [facility] ?

## 2021-12-15 ENCOUNTER — Other Ambulatory Visit: Payer: Self-pay | Admitting: Cardiovascular Disease

## 2021-12-15 MED ORDER — FUROSEMIDE 20 MG PO TABS: 20.0000 mg | ORAL_TABLET | ORAL | 11 refills | Status: DC

## 2021-12-23 ENCOUNTER — Other Ambulatory Visit: Payer: Self-pay

## 2021-12-23 ENCOUNTER — Telehealth: Payer: Self-pay | Admitting: Family Medicine

## 2021-12-23 MED ORDER — MONTELUKAST SODIUM 10 MG PO TABS
10.0000 mg | ORAL_TABLET | Freq: Every day | ORAL | 1 refills | Status: DC
Start: 2021-12-23 — End: 2022-04-05

## 2021-12-23 NOTE — Telephone Encounter (Signed)
..   Encourage patient to contact the pharmacy for refills or they can request refills through [place]:  09/28/21  NEXT APPOINTMENT DATE: 12/29/21  MEDICATION:montelukast (SINGULAIR) 10 MG tablet  Is the patient out of medication?   [id number] [place] [address]:  [phone] Fax:  [phone]     Let patient know to contact pharmacy at the end of the day to make sure medication is ready.  Please notify patient to allow 48-72 hours to process

## 2021-12-23 NOTE — Telephone Encounter (Signed)
Rx sent 

## 2021-12-29 ENCOUNTER — Encounter: Payer: Self-pay | Admitting: Family Medicine

## 2021-12-29 ENCOUNTER — Ambulatory Visit (INDEPENDENT_AMBULATORY_CARE_PROVIDER_SITE_OTHER): Payer: Medicare Other | Admitting: Family Medicine

## 2021-12-29 VITALS — BP 116/60 | HR 66 | Temp 98.1°F | Ht 64.0 in | Wt 197.2 lb

## 2021-12-29 DIAGNOSIS — E1159 Type 2 diabetes mellitus with other circulatory complications: Secondary | ICD-10-CM | POA: Diagnosis not present

## 2021-12-29 DIAGNOSIS — I152 Hypertension secondary to endocrine disorders: Secondary | ICD-10-CM

## 2021-12-29 DIAGNOSIS — I10 Essential (primary) hypertension: Secondary | ICD-10-CM | POA: Diagnosis not present

## 2021-12-29 DIAGNOSIS — E1142 Type 2 diabetes mellitus with diabetic polyneuropathy: Secondary | ICD-10-CM

## 2021-12-29 DIAGNOSIS — I5020 Unspecified systolic (congestive) heart failure: Secondary | ICD-10-CM

## 2021-12-29 LAB — MICROALBUMIN / CREATININE URINE RATIO
Creatinine,U: 10.7 mg/dL
Microalb Creat Ratio: 6.5 mg/g (ref 0.0–30.0)
Microalb, Ur: <0.7 mg/dL (ref 0.0–1.9)

## 2021-12-29 LAB — BASIC METABOLIC PANEL
BUN: 26 mg/dL — ABNORMAL HIGH (ref 6–23)
CO2: 26 mEq/L (ref 19–32)
Calcium: 9.6 mg/dL (ref 8.4–10.5)
Chloride: 103 mEq/L (ref 96–112)
Creatinine, Ser: 1.27 mg/dL — ABNORMAL HIGH (ref 0.40–1.20)
GFR: 41.22 mL/min — ABNORMAL LOW (ref >60.00)
Glucose, Bld: 120 mg/dL — ABNORMAL HIGH (ref 70–99)
Potassium: 4.2 mEq/L (ref 3.5–5.1)
Sodium: 139 mEq/L (ref 135–145)

## 2021-12-29 LAB — POCT GLYCOSYLATED HEMOGLOBIN (HGB A1C): Hemoglobin A1C: 7.9 % — AB (ref 4.0–5.6)

## 2021-12-29 MED ORDER — BYDUREON BCISE 2 MG/0.85ML ~~LOC~~ AUIJ: 2.0000 mg | AUTO-INJECTOR | SUBCUTANEOUS | 11 refills | Status: DC

## 2021-12-29 NOTE — Patient Instructions (Signed)
Please return in 3 months for diabetes follow up   We will try the [facility] for your diabetes and see if we can get you patient assistance for it.   If you have any questions or concerns, please don't hesitate to send me a message via MyChart or call the office at [phone]. Thank you for visiting with [place] today! It's our pleasure caring for you.

## 2021-12-29 NOTE — Progress Notes (Signed)
Subjective  CC:  Chief Complaint  Patient presents with   Diabetes    Pt here to f/U with DM    HPI: [name] is a 76 y.o. female who presents to the office today for follow up of diabetes and problems listed above in the chief complaint.  Diabetes follow up: Her diabetic control is reported as Unchanged. However, has been out of trulicity for 2 months.  She denies exertional CP or SOB or symptomatic hypoglycemia. She denies foot sores or paresthesias.  Hypertension hyperlipidemia: Reports both are well controlled.  No side effects of medication. Hoarseness: Seen ENT and treated for reflux, allergies.  Questions whether it may also be a side effect of medication. Peripheral neuropathy symptoms: Just started in her toes.  Likely diabetes.  Not bad enough to start medicines.     BP Readings from Last 3 Encounters:  12/29/21 116/60  11/05/21 132/70  09/28/21 (!) 112/58    Assessment  1. Type 2 diabetes mellitus with peripheral neuropathy (HCC)   2. Hypertension associated with diabetes ([place])   3. 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 Will restart GLP-1; trial of bcise and will refer to patient assistance program. Contine lantus, farxiga and met low dose.  HTN: well controlled.  CHF: stable per cards.   Follow up: 3 mo to recheck diabetes. Orders Placed This Encounter  Procedures   Microalbumin / creatinine urine ratio   Basic metabolic panel   Ambulatory referral to Ophthalmology   POCT HgB A1C   Meds ordered this encounter  Medications   Exenatide ER (BYDUREON BCISE) 2 MG/0.85ML AUIJ    Sig: Inject 2 mg into the skin once a week.    Dispense:  3.4 mL    Refill:  11      Immunization History  Administered Date(s) Administered   Fluad Quad(high Dose 65+) 07/01/2020, 04/16/2021   Influenza Split 06/01/2010   Influenza, High Dose Seasonal PF 05/13/2014, 04/24/2015,  04/12/2017, 04/19/2018, 04/24/2019, 06/16/2020   Influenza, Seasonal, Injecte, Preservative Fre 04/09/2013, 05/03/2016   Influenza,inj,Quad PF,6+ Mos 05/01/2017   Influenza-Unspecified 04/03/2012   Moderna Sars-Covid-2 Vaccination 01/15/2021, 02/12/2021   Pneumococcal Conjugate-13 05/13/2014   Pneumococcal Polysaccharide-23 02/15/2011   Td 08/01/1998   Tdap 05/01/2004   Unspecified SARS-COV-2 Vaccination 05/11/2021   Zoster Recombinat (Shingrix) 04/24/2019, 08/13/2019   Zoster, Live 02/15/2011    Diabetes Related Lab Review: Lab Results  Component Value Date   HGBA1C 7.9 (A) 12/29/2021   HGBA1C 7.1 (H) 09/28/2021   HGBA1C 7.7 (A) 06/09/2021    Lab Results  Component Value Date   MICROALBUR <0.7 12/29/2021   Lab Results  Component Value Date   CREATININE 1.27 (H) 12/29/2021   BUN 26 (H) 12/29/2021   NA 139 12/29/2021   K 4.2 12/29/2021   CL 103 12/29/2021   CO2 26 12/29/2021   Lab Results  Component Value Date   CHOL 111 09/28/2021   Lab Results  Component Value Date   HDL 55.70 09/28/2021   Lab Results  Component Value Date   LDLCALC 37 09/28/2021   Lab Results  Component Value Date   TRIG 91.0 09/28/2021   TRIG 194 (H) 08/13/2020   TRIG 357 (H) 08/12/2020   Lab Results  Component Value Date   CHOLHDL 2 09/28/2021   No results found for: LDLDIRECT The ASCVD Risk score (Arnett DK, et al., 2019) failed to calculate for  the following reasons:   The valid total cholesterol range is 130 to 320 mg/dL I have reviewed the PMH, Fam and Soc history. 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     Obesity 05/25/2011     Priority: High   Hypertension associated with diabetes ([place] 11/08/2010    Priority: High   Gout 07/19/2021    Priority: Medium     On allopurinol     Primary osteoarthritis of left shoulder 07/29/2020    Priority: Medium    DDD (degenerative disc disease), cervical 04/30/2020    Priority: Medium    Osteoarthritis of left knee 11/05/2018    Priority: Medium    Chronic low back pain 09/05/2017    Priority: Medium    Degenerative lumbar disc 06/04/2015    Priority: Medium    S/P lumbar laminectomy 06/04/2015    Priority: Medium    Restless leg syndrome 05/13/2014    Priority: Medium    Spinal stenosis of lumbar region without neurogenic claudication 09/12/2012    Priority: Medium     Formatting of this note might be different from the original. Right L4/5     GERD (gastroesophageal reflux disease) 05/25/2011    Priority: Medium     Chronic recurrent heartburn symptoms for 3 or 4 years without weight loss, bleeding or dysphagia. Bonds well to daily PPI.CGatha Mayer MD, [facility]     Mitral valve prolapse 10/10/2008    Priority: Medium     Formatting of this note might be different from the original. 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     Chronic allergic rhinitis 08/19/2010    Priority: Low   Old tear of meniscus of left knee 09/28/2021   Combined hyperlipidemia associated with type 2 diabetes mellitus ([place] 09/28/2021    Social History: Patient  reports that she has never smoked. She has never used smokeless tobacco. She reports that she does not drink alcohol and does not use drugs.  Review of Systems: Ophthalmic: negative for eye pain, loss of vision or double  vision Cardiovascular: negative for chest pain Respiratory: negative for SOB or persistent cough Gastrointestinal: negative for abdominal pain Genitourinary: negative for dysuria or gross hematuria MSK: negative for foot lesions Neurologic: negative for weakness or gait disturbance  Objective  Vitals: BP 116/60   Pulse 66   Temp 98.1 F (36.7 C)   Ht 5' 4"  (1.626 m)   Wt 197 lb 3.2 oz (89.4 kg)   SpO2 94%   BMI 33.85 kg/m  General: well appearing, no acute distress  Psych:  Alert and oriented, normal mood and affect HEENT:  Normocephalic, atraumatic, moist mucous membranes, supple neck  Cardiovascular:  Nl S1 and S2, RRR without murmur, gallop or rub. no edema Respiratory:  Good breath sounds bilaterally, CTAB with normal effort, no rales  Diabetic education: ongoing education regarding chronic disease management for diabetes was given today. We continue to reinforce the ABC's of diabetic management: A1c (<7 or 8 dependent upon patient), tight blood pressure control, and cholesterol management with goal LDL < 100 minimally. We discuss diet strategies, exercise recommendations, medication options and possible side effects. At each visit, we review recommended immunizations and preventive care recommendations for diabetics and stress that good diabetic control can prevent other problems. See below for this patient's data.   Commons side effects, risks, benefits, and alternatives for medications and treatment plan prescribed today were discussed, and the patient expressed understanding of the given instructions. Patient is instructed to call or message via MyChart if he/she has any questions or concerns regarding our treatment plan. No barriers to understanding were identified. We discussed Red Flag symptoms and signs in detail. Patient expressed understanding regarding what to do in case of urgent or emergency type symptoms.  Medication list was reconciled, printed and provided to the  patient in AVS. Patient instructions and summary information was reviewed with the patient as documented in the AVS. This note was prepared with assistance of Dragon voice recognition software. Occasional wrong-word or sound-a-like substitutions may have occurred due to the inherent limitations of voice recognition software  This visit occurred during the SARS-CoV-2 public health emergency.  Safety protocols were in place, including screening questions prior to the visit, additional usage of staff PPE, and extensive cleaning of exam room while observing appropriate contact time as indicated for disinfecting solutions.

## 2021-12-30 ENCOUNTER — Other Ambulatory Visit: Payer: Self-pay

## 2021-12-30 DIAGNOSIS — I428 Other cardiomyopathies: Secondary | ICD-10-CM

## 2021-12-30 MED ORDER — BYDUREON BCISE 2 MG/0.85ML ~~LOC~~ AUIJ: 2.0000 mg | AUTO-INJECTOR | SUBCUTANEOUS | 3 refills | Status: DC

## 2021-12-30 NOTE — Addendum Note (Signed)
Addended by: Billey Chang on: 12/30/2021 12:24 PM   Modules accepted: Orders

## 2021-12-30 NOTE — Progress Notes (Signed)
It can also be sent in electronically to that [facility] which I have found gets them the medication faster!

## 2021-12-30 NOTE — Progress Notes (Signed)
I have faxed the script to [facility]

## 2022-01-10 ENCOUNTER — Ambulatory Visit (HOSPITAL_COMMUNITY): Payer: Medicare Other | Attending: Cardiovascular Disease

## 2022-01-10 ENCOUNTER — Other Ambulatory Visit: Payer: Self-pay | Admitting: Cardiology

## 2022-01-10 DIAGNOSIS — I5020 Unspecified systolic (congestive) heart failure: Secondary | ICD-10-CM

## 2022-01-10 DIAGNOSIS — I428 Other cardiomyopathies: Secondary | ICD-10-CM | POA: Insufficient documentation

## 2022-01-10 LAB — ECHOCARDIOGRAM COMPLETE
Area-P 1/2: 2.95 cm2
S' Lateral: 3.9 cm

## 2022-01-17 ENCOUNTER — Telehealth: Payer: Self-pay

## 2022-01-17 NOTE — Telephone Encounter (Signed)
[facility] SHIPMENT NOTIFICATION ON 01-15-2022 ID# [id number],  TRACKING NUMBER [phone], PHONE # [phone]

## 2022-01-24 ENCOUNTER — Other Ambulatory Visit: Payer: Self-pay | Admitting: Family Medicine

## 2022-03-04 ENCOUNTER — Other Ambulatory Visit: Payer: Self-pay | Admitting: Family Medicine

## 2022-03-19 ENCOUNTER — Other Ambulatory Visit: Payer: Self-pay | Admitting: Family Medicine

## 2022-03-31 ENCOUNTER — Ambulatory Visit: Payer: Medicare Other | Admitting: Family Medicine

## 2022-04-01 ENCOUNTER — Other Ambulatory Visit: Payer: Self-pay | Admitting: Family Medicine

## 2022-04-05 ENCOUNTER — Encounter: Payer: Self-pay | Admitting: Family Medicine

## 2022-04-05 ENCOUNTER — Ambulatory Visit (INDEPENDENT_AMBULATORY_CARE_PROVIDER_SITE_OTHER): Payer: Medicare Other | Admitting: Family Medicine

## 2022-04-05 VITALS — BP 130/68 | HR 66 | Temp 98.0°F | Ht 64.0 in | Wt 195.4 lb

## 2022-04-05 DIAGNOSIS — I428 Other cardiomyopathies: Secondary | ICD-10-CM

## 2022-04-05 DIAGNOSIS — I152 Hypertension secondary to endocrine disorders: Secondary | ICD-10-CM

## 2022-04-05 DIAGNOSIS — J309 Allergic rhinitis, unspecified: Secondary | ICD-10-CM | POA: Diagnosis not present

## 2022-04-05 DIAGNOSIS — R490 Dysphonia: Secondary | ICD-10-CM | POA: Insufficient documentation

## 2022-04-05 DIAGNOSIS — I5020 Unspecified systolic (congestive) heart failure: Secondary | ICD-10-CM

## 2022-04-05 DIAGNOSIS — K219 Gastro-esophageal reflux disease without esophagitis: Secondary | ICD-10-CM

## 2022-04-05 DIAGNOSIS — E1159 Type 2 diabetes mellitus with other circulatory complications: Secondary | ICD-10-CM

## 2022-04-05 DIAGNOSIS — E1142 Type 2 diabetes mellitus with diabetic polyneuropathy: Secondary | ICD-10-CM | POA: Diagnosis not present

## 2022-04-05 LAB — POCT GLYCOSYLATED HEMOGLOBIN (HGB A1C): Hemoglobin A1C: 6.8 % — AB (ref 4.0–5.6)

## 2022-04-05 MED ORDER — LANTUS SOLOSTAR 100 UNIT/ML ~~LOC~~ SOPN
15.0000 [IU] | PEN_INJECTOR | Freq: Every day | SUBCUTANEOUS | 5 refills | Status: DC
Start: 2022-04-05 — End: 2022-10-05

## 2022-04-05 MED ORDER — METFORMIN HCL 500 MG PO TABS: 500.0000 mg | ORAL_TABLET | Freq: Two times a day (BID) | ORAL | 3 refills | Status: DC

## 2022-04-05 NOTE — Progress Notes (Signed)
Subjective  CC:  Chief Complaint  Patient presents with   Diabetes    Pt here for 43mo f/U with DM    HPI: Kristen TARTTis a 76y.o. female who presents to the office today for follow up of diabetes and problems listed above in the chief complaint.  Diabetes follow up: Her diabetic control is reported as Improved. Now getting farxiga and bcise through patient assistance programs. Also on met 500 bid and lantus 15 units qhs. Feels well. Has eye exam scheduled for October.  She denies exertional CP or SOB or symptomatic hypoglycemia. She denies foot sores. Ckd stable on farxiga. [facility]  AR: clear rhinorrehea q am in spite of allegra and flonase. Montelukast did not help. Hasn't seen allergist GERD on PPI and pepcid but still with sxs at times.  Reviewed cards visit from April: improved non ischemic cardiomyopathy.   Wt Readings from Last 3 Encounters:  04/05/22 195 lb 6.4 oz (88.6 kg)  12/29/21 197 lb 3.2 oz (89.4 kg)  11/05/21 197 lb (89.4 kg)    BP Readings from Last 3 Encounters:  04/05/22 130/68  12/29/21 116/60  11/05/21 132/70    Assessment  1. Type 2 diabetes mellitus with peripheral neuropathy (HCC)   2. Chronic allergic rhinitis   3. Hypertension associated with diabetes ([place]   4. Gastroesophageal reflux disease, unspecified whether esophagitis present   5. 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 Nonischemic cardiomyopathy ([place]   7. Hoarseness      Plan  Diabetes is now currently very well controlled. Continue meds as listed above. Consider allergy referral Will see ENT first for hoarseness. ? Related to chronic reflux Bp is controlled CHF is well controlled.  Follow up: 6 mo for cpe. Orders Placed This Encounter  Procedures   POCT HgB A1C   Meds ordered this encounter  Medications   insulin glargine (LANTUS SOLOSTAR) 100 UNIT/ML Solostar Pen    Sig: Inject 15 Units into the  skin at bedtime.    Dispense:  15 mL    Refill:  5   metFORMIN (GLUCOPHAGE) 500 MG tablet    Sig: Take 1 tablet (500 mg total) by mouth 2 (two) times daily with a meal.    Dispense:  180 tablet    Refill:  3    Please keep on file for next refill due in November.      Immunization History  Administered Date(s) Administered   Fluad Quad(high Dose 65+) 07/01/2020, 04/16/2021   Influenza Split 06/01/2010   Influenza, High Dose Seasonal PF 05/13/2014, 04/24/2015, 04/12/2017, 04/19/2018, 04/24/2019, 06/16/2020   Influenza, Seasonal, Injecte, Preservative Fre 04/09/2013, 05/03/2016   Influenza,inj,Quad PF,6+ Mos 05/01/2017   Influenza-Unspecified 04/03/2012   Moderna Sars-Covid-2 Vaccination 01/15/2021, 02/12/2021   Pneumococcal Conjugate-13 05/13/2014   Pneumococcal Polysaccharide-23 02/15/2011   Td 08/01/1998   Tdap 05/01/2004   Unspecified SARS-COV-2 Vaccination 05/11/2021   Zoster Recombinat (Shingrix) 04/24/2019, 08/13/2019   Zoster, Live 02/15/2011    Diabetes Related Lab Review: Lab Results  Component Value Date   HGBA1C 6.8 (A) 04/05/2022   HGBA1C 7.9 (A) 12/29/2021   HGBA1C 7.1 (H) 09/28/2021    Lab Results  Component Value Date   MICROALBUR <0.7 12/29/2021   Lab Results  Component Value Date   CREATININE 1.27 (H) 12/29/2021   BUN 26 (H) 12/29/2021   NA 139 12/29/2021   K 4.2 12/29/2021   CL 103 12/29/2021  CO2 26 12/29/2021   Lab Results  Component Value Date   CHOL 111 09/28/2021   Lab Results  Component Value Date   HDL 55.70 09/28/2021   Lab Results  Component Value Date   LDLCALC 37 09/28/2021   Lab Results  Component Value Date   TRIG 91.0 09/28/2021   TRIG 194 (H) 08/13/2020   TRIG 357 (H) 08/12/2020   Lab Results  Component Value Date   CHOLHDL 2 09/28/2021   No results found for: "LDLDIRECT" The ASCVD Risk score (Arnett DK, et al., 2019) failed to calculate for the following reasons:   The valid total cholesterol range is 130 to  320 mg/dL I have reviewed the PMH, Fam and Soc history. 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    Obesity 05/25/2011    Priority: High   Hypertension associated with diabetes ([place] 11/08/2010    Priority: High   Gout 07/19/2021    Priority: Medium     On allopurinol    Primary osteoarthritis of left shoulder 07/29/2020    Priority: Medium    DDD (degenerative disc disease), cervical 04/30/2020    Priority: Medium    Osteoarthritis of left knee 11/05/2018    Priority: Medium    Chronic low back pain 09/05/2017    Priority: Medium    Degenerative lumbar disc 06/04/2015    Priority: Medium    S/P lumbar laminectomy 06/04/2015    Priority: Medium    Restless leg syndrome 05/13/2014    Priority: Medium    Spinal stenosis of lumbar region without neurogenic claudication 09/12/2012    Priority: Medium     Formatting of this note might be different from the original. Right L4/5    GERD (gastroesophageal reflux disease) 05/25/2011    Priority: Medium     Chronic recurrent heartburn symptoms for 3 or 4 years without weight loss, bleeding or dysphagia. Bonds well to daily PPI.CGatha Mayer MD, [facility]    Mitral valve prolapse 10/10/2008    Priority: Medium     Formatting of this note might be different from the original. 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    Chronic allergic rhinitis 08/19/2010    Priority: Low   Hoarseness 04/05/2022   Old tear of meniscus of left knee 09/28/2021   Combined hyperlipidemia associated with type 2 diabetes mellitus ([place] 09/28/2021    Social History: Patient  reports that she has never smoked. She has never used smokeless tobacco. She reports that she does not drink alcohol and does not use drugs.  Review of Systems: Ophthalmic: negative for eye pain, loss of vision or double vision Cardiovascular: negative for chest pain Respiratory: negative for SOB or persistent cough Gastrointestinal: negative for abdominal pain Genitourinary: negative for dysuria or gross hematuria MSK: negative for foot lesions Neurologic: negative for weakness or  gait disturbance  Objective  Vitals: BP 130/68   Pulse 66   Temp 98 F (36.7 C)   Ht 5' 4"  (1.626 m)   Wt 195 lb 6.4 oz (88.6 kg)   SpO2 98%   BMI 33.54 kg/m  General: well appearing, no acute distress  Psych:  Alert and oriented, normal mood and affect HEENT:  Normocephalic, atraumatic, moist mucous membranes, supple neck  Cardiovascular:  Nl S1 and S2, RRR with systolic murmur, w/o gallop or rub. no edema Respiratory:  Good breath sounds bilaterally, CTAB with normal effort, no rales Foot exam: no erythema, pallor, or cyanosis visible nl proprioception and sensation to monofilament testing bilaterally, +2 distal pulses bilaterally    Diabetic education: ongoing education regarding chronic disease management for diabetes was given today. We continue to reinforce the ABC's of diabetic management: A1c (<7 or 8 dependent upon patient), tight blood pressure control, and cholesterol management with goal LDL < 100 minimally. We discuss diet strategies, exercise recommendations, medication options and possible side effects. At each visit, we review recommended immunizations and preventive care recommendations for diabetics and stress that good  diabetic control can prevent other problems. See below for this patient's data.   Commons side effects, risks, benefits, and alternatives for medications and treatment plan prescribed today were discussed, and the patient expressed understanding of the given instructions. Patient is instructed to call or message via MyChart if he/she has any questions or concerns regarding our treatment plan. No barriers to understanding were identified. We discussed Red Flag symptoms and signs in detail. Patient expressed understanding regarding what to do in case of urgent or emergency type symptoms.  Medication list was reconciled, printed and provided to the patient in AVS. Patient instructions and summary information was reviewed with the patient as documented in the AVS. This note was prepared with assistance of Dragon voice recognition software. Occasional wrong-word or sound-a-like substitutions may have occurred due to the inherent limitations of voice recognition software  This visit occurred during the SARS-CoV-2 public health emergency.  Safety protocols were in place, including screening questions prior to the visit, additional usage of staff PPE, and extensive cleaning of exam room while observing appropriate contact time as indicated for disinfecting solutions.

## 2022-04-05 NOTE — Patient Instructions (Signed)
Please return in 6 months for your annual complete physical; please come fasting.   Glad you are doing well.  If you have any questions or concerns, please don't hesitate to send me a message via MyChart or call the office at [phone]. Thank you for visiting with [place] today! It's our pleasure caring for you.

## 2022-04-21 DIAGNOSIS — J3801 Paralysis of vocal cords and larynx, unilateral: Secondary | ICD-10-CM | POA: Insufficient documentation

## 2022-04-21 DIAGNOSIS — R490 Dysphonia: Secondary | ICD-10-CM | POA: Insufficient documentation

## 2022-04-21 DIAGNOSIS — J385 Laryngeal spasm: Secondary | ICD-10-CM | POA: Insufficient documentation

## 2022-04-23 ENCOUNTER — Other Ambulatory Visit: Payer: Self-pay | Admitting: Family Medicine

## 2022-04-25 ENCOUNTER — Encounter: Payer: Self-pay | Admitting: *Deleted

## 2022-05-06 ENCOUNTER — Ambulatory Visit (INDEPENDENT_AMBULATORY_CARE_PROVIDER_SITE_OTHER): Payer: Medicare Other

## 2022-05-06 ENCOUNTER — Other Ambulatory Visit: Payer: Self-pay | Admitting: Otolaryngology

## 2022-05-06 VITALS — Wt 195.0 lb

## 2022-05-06 DIAGNOSIS — J329 Chronic sinusitis, unspecified: Secondary | ICD-10-CM

## 2022-05-06 DIAGNOSIS — Z0000 Encounter for general adult medical examination without abnormal findings: Secondary | ICD-10-CM

## 2022-05-06 NOTE — Progress Notes (Signed)
Virtual Visit via Telephone Note  I connected with  [name] on 05/06/22 at  2:45 PM EDT by telephone and verified that I am speaking with the correct person using two identifiers.  Medicare Annual Wellness visit completed telephonically due to Covid-19 pandemic.   Persons participating in this call: This Health Coach and this patient.   Location: Patient: home  Provider: office    I discussed the limitations, risks, security and privacy concerns of performing an evaluation and management service by telephone and the availability of in person appointments. The patient expressed understanding and agreed to proceed.  Unable to perform video visit due to video visit attempted and failed and/or patient does not have video capability.   Some vital signs may be absent or patient reported.   Willette Brace, LPN   Subjective:   [name] is a 76 y.o. female who presents for Medicare Annual (Subsequent) preventive examination.  Review of Systems     Cardiac Risk Factors include: advanced age (>21mn, >>76women);diabetes mellitus;hypertension;dyslipidemia;obesity (BMI >30kg/m2)     Objective:    Today's Vitals   05/06/22 1436  Weight: 195 lb (88.5 kg)   Body mass index is 33.47 kg/m.     05/06/2022    2:42 PM 08/17/2021   10:51 AM 04/23/2021    2:40 PM 12/14/2020    5:34 AM 12/11/2020   11:16 AM 09/07/2020   10:28 AM 08/13/2020   10:21 AM  Advanced Directives  Does Patient Have a Medical Advance Directive? No No No No No No No  Would patient like information on creating a medical advance directive? No - Patient declined  Yes (ED - Information included in AVS) No - Patient declined  No - Patient declined No - Patient declined    Current Medications (verified) Outpatient Encounter Medications as of 05/06/2022  Medication Sig   allopurinol (ZYLOPRIM) 100 MG tablet Take 1 tablet (100 mg total) by mouth daily.   Amoxicillin-Pot Clavulanate (AUGMENTIN PO) Take by mouth. 7  days due to sinus infection from ENT   carvedilol (COREG) 6.25 MG tablet Take 1 tablet (6.25 mg total) by mouth 2 (two) times daily.   cholecalciferol (VITAMIN D) 1000 UNITS tablet Take 1,000 Units by mouth daily.   cycloSPORINE (RESTASIS) 0.05 % ophthalmic emulsion Place 1 drop into both eyes 2 (two) times daily.   dapagliflozin propanediol (FARXIGA) 10 MG TABS tablet Take 1 tablet (10 mg total) by mouth daily before breakfast.   Exenatide ER (BYDUREON BCISE) 2 MG/0.85ML AUIJ Inject 2 mg into the skin once a week.   fluticasone (FLONASE) 50 MCG/ACT nasal spray Place 1 spray into both nostrils daily.   furosemide (LASIX) 20 MG tablet Take 1 tablet (20 mg total) by mouth as directed. Monday,wed, Friday and sat only.   insulin glargine (LANTUS SOLOSTAR) 100 UNIT/ML Solostar Pen Inject 15 Units into the skin at bedtime.   Magnesium Oxide 400 MG CAPS Take 1 capsule (400 mg total) by mouth daily.   metFORMIN (GLUCOPHAGE) 500 MG tablet Take 1 tablet (500 mg total) by mouth 2 (two) times daily with a meal.   Multiple Vitamin (MULTIVITAMIN WITH MINERALS) TABS tablet Take 1 tablet by mouth daily.   omeprazole (PRILOSEC) 40 MG capsule TAKE 1 CAPSULE (40 MG TOTAL) BY MOUTH DAILY.   pregabalin (LYRICA) 100 MG capsule Take 1 capsule by mouth twice daily   rOPINIRole (REQUIP) 3 MG tablet Take 1 tablet (3 mg total) by mouth at bedtime.  rosuvastatin (CRESTOR) 20 MG tablet Take 1 tablet (20 mg total) by mouth daily.   sacubitril-valsartan (ENTRESTO) 49-51 MG Take 1 tablet by mouth 2 (two) times daily.   spironolactone (ALDACTONE) 25 MG tablet Take 0.5 tablets (12.5 mg total) by mouth daily.   [DISCONTINUED] diclofenac Sodium (VOLTAREN) 1 % GEL Apply 4 g topically 4 (four) times daily as needed (knee pain). (Patient not taking: Reported on 04/05/2022)   No facility-administered encounter medications on file as of 05/06/2022.    Allergies (verified) Amlodipine, Depo-medrol [methylprednisolone], Gabapentin,  Levothyroxine, Sitagliptin, Ace inhibitors, Betadine [povidone iodine], Codeine, Cortizone-10 [hydrocortisone], Tetracyclines & related, and Dexamethasone   History: Past Medical History:  Diagnosis Date   Arthritis    Chronic back pain    buldging disc,scoliosis,arthritis   Congestive heart failure with left ventricular systolic dysfunction ([place]) 08/24/2020   Diabetes mellitus without complication ([place])    takes Trulicity,Jardiance,and Metformin daily.Average fasting blood sugar runs around130   GERD (gastroesophageal reflux disease)    takes Omeprazole daily   Gout 07/19/2021   On allopurinol   History of bronchitis > 8 yrs ago   History of shingles    HTN (hypertension)    takes Amlodipine and Micardis daily   Hx of colonic polyps    benign   Internal and external hemorrhoids without complication    Joint pain    Mitral valve prolapse    Nocturia    PONV (postoperative nausea and vomiting)    when gets injections in joints gets hives.Betadine rash   Restless leg syndrome    takes Requip at bedtime   Seasonal allergies    takes Claritin daily as needed   Urinary frequency    Uterine fibroid    Past Surgical History:  Procedure Laterality Date   BUNIONECTOMY Bilateral    COLONOSCOPY  07/12/2004   diverticulosis, internal and external hemorrhoids   COLONOSCOPY     FINGER ARTHROSCOPY WITH CARPOMETACARPEL ([place]) ARTHROPLASTY Right 09/28/2015   Procedure: RIGHT THUMB TRAPEZIUM EXCISION WITH CARPOMETACARPEL ([place]) ARTHROPLASTY AND TENDON TRANSFER;  Surgeon: Iran Planas, MD;  Location: [facility];  Service: Orthopedics;  Laterality: Right;   HEMORRHOID SURGERY     almost 40 yrs ago   HYSTEROSCOPY WITH D & C  08/23/2000   and resectoscopic myomectomy   LUMBAR DISC SURGERY  03/16/2005   LUMBAR EPIDURAL INJECTION     PLANTAR FASCIA RELEASE Left 02/03/2010   and torn tendon   REVERSE SHOULDER ARTHROPLASTY Right 12/14/2020   Procedure: REVERSE SHOULDER ARTHROPLASTY;  Surgeon: Netta Cedars, MD;  Location: [facility];  Service: Orthopedics;  Laterality: Right;   RIGHT/LEFT HEART CATH AND CORONARY ANGIOGRAPHY N/A 09/07/2020   Procedure: RIGHT/LEFT HEART CATH AND CORONARY ANGIOGRAPHY;  Surgeon: Nelva Bush, MD;  Location: [facility];  Service: Cardiovascular;  Laterality: N/A;   TENDON TRANSFER Right 09/28/2015   Procedure: TENDON TRANSFER;  Surgeon: Iran Planas, MD;  Location: [facility];  Service: Orthopedics;  Laterality: Right;   TONSILLECTOMY     TUBAL LIGATION     WRIST SURGERY     left, removal of cyst   Family History  Problem Relation Age of Onset   Pancreatic cancer Father    Colon cancer Neg Hx    Breast cancer Neg Hx    Social History   Socioeconomic History   Marital status: Married    Spouse name: Not on file   Number of children: 3   Years of education: Not on file   Highest education  level: Not on file  Occupational History   Occupation: Retired   Tobacco Use   Smoking status: Never   Smokeless tobacco: Never  Vaping Use   Vaping Use: Never used  Substance and Sexual Activity   Alcohol use: No    Alcohol/week: 0.0 standard drinks of alcohol   Drug use: No   Sexual activity: Yes  Other Topics Concern   Not on file  Social History Narrative   1 caffeine drinks daily         Social Determinants of Health   Financial Resource Strain: Low Risk  (05/06/2022)   Overall Financial Resource Strain (CARDIA)    Difficulty of Paying Living Expenses: Not hard at all  Food Insecurity: No Food Insecurity (05/06/2022)   Hunger Vital Sign    Worried About Running Out of Food in the Last Year: Never true    Ran Out of Food in the Last Year: Never true  Transportation Needs: No Transportation Needs (05/06/2022)   PRAPARE - Hydrologist (Medical): No    Lack of Transportation (Non-Medical): No  Physical Activity: Insufficiently Active (05/06/2022)   Exercise Vital Sign    Days of Exercise per Week: 2 days    Minutes of  Exercise per Session: 30 min  Stress: No Stress Concern Present (05/06/2022)   [place]    Feeling of Stress : Not at all  Social Connections: Moderately Integrated (05/06/2022)   Social Connection and Isolation Panel [NHANES]    Frequency of Communication with Friends and Family: More than three times a week    Frequency of Social Gatherings with Friends and Family: More than three times a week    Attends Religious Services: More than 4 times per year    Active Member of [employer] or Organizations: No    Attends Music therapist: Never    Marital Status: Married    Tobacco Counseling Counseling given: Not Answered   Clinical Intake:  Pre-visit preparation completed: Yes  Pain : No/denies pain     BMI - recorded: 33.47 Nutritional Status: BMI > 30  Obese Nutritional Risks: None Diabetes: Yes CBG done?: Yes (111 per pt) CBG resulted in Enter/ Edit results?: No Did pt. bring in CBG monitor from home?: No  How often do you need to have someone help you when you read instructions, pamphlets, or other written materials from your doctor or pharmacy?: 1 - Never  Diabetic?Nutrition Risk Assessment:  Has the patient had any N/V/D within the last 2 months?  No  Does the patient have any non-healing wounds?  No  Has the patient had any unintentional weight loss or weight gain?  No   Diabetes:  Is the patient diabetic?  Yes  If diabetic, was a CBG obtained today?  Yes  Did the patient bring in their glucometer from home?  No  How often do you monitor your CBG's? daily.   Financial Strains and Diabetes Management:  Are you having any financial strains with the device, your supplies or your medication? No .  Does the patient want to be seen by Chronic Care Management for management of their diabetes?  No  Would the patient like to be referred to a Nutritionist or for Diabetic Management?  No   Diabetic  Exams:  Diabetic Eye Exam: Overdue for diabetic eye exam. Pt has been advised about the importance in completing this exam. Patient advised to call and  schedule an eye exam. Diabetic Foot Exam: Completed 04/05/22   Interpreter Needed?: No  Information entered by :: Charlott Rakes, LPN   Activities of Daily Living    05/06/2022    2:43 PM  In your present state of health, do you have any difficulty performing the following activities:  Hearing? 0  Vision? 0  Difficulty concentrating or making decisions? 0  Walking or climbing stairs? 0  Dressing or bathing? 0  Doing errands, shopping? 0  Preparing Food and eating ? N  Using the Toilet? N  In the past six months, have you accidently leaked urine? N  Do you have problems with loss of bowel control? N  Managing your Medications? N  Managing your Finances? N  Housekeeping or managing your Housekeeping? N    Patient Care Team: Leamon Arnt, MD as PCP - General (Family Medicine) Freada Bergeron, MD as PCP - Cardiology (Cardiology) Rozetta Nunnery, MD (Inactive) as Consulting Physician (Otolaryngology) Netta Cedars, MD as Consulting Physician (Orthopedic Surgery)  Indicate any recent Medical Services you may have received from other than [facility] providers in the past year (date may be approximate).     Assessment:   This is a routine wellness examination for [facility].  Hearing/Vision screen Hearing Screening - Comments:: Pt denies any hearing issues  Vision Screening - Comments:: Pt follows up with Dr Katy Fitch for annual eye exams   Dietary issues and exercise activities discussed: Current Exercise Habits: Home exercise routine, Type of exercise: walking, Time (Minutes): 20, Frequency (Times/Week): 2, Weekly Exercise (Minutes/Week): 40   Goals Addressed             This Visit's Progress    Patient Stated       Get rid of sinus issues        Depression Screen    05/06/2022    2:42 PM 04/05/2022    9:29 AM  04/23/2021    2:39 PM 10/06/2020    8:56 AM  PHQ 2/9 Scores  PHQ - 2 Score 0 0 0 0    Fall Risk    05/06/2022    2:43 PM 04/05/2022    9:29 AM 12/29/2021   10:27 AM 04/23/2021    2:42 PM  Fall Risk   Falls in the past year? 0 0 0 0  Number falls in past yr: 0 0 0 0  Injury with Fall? 0 0 0 0  Risk for fall due to : No Fall Risks;Impaired vision No Fall Risks No Fall Risks Impaired vision  Follow up Falls prevention discussed Falls evaluation completed Falls evaluation completed Falls prevention discussed    FALL RISK PREVENTION PERTAINING TO THE HOME:  Any stairs in or around the home? Yes  If so, are there any without handrails? No  Home free of loose throw rugs in walkways, pet beds, electrical cords, etc? Yes  Adequate lighting in your home to reduce risk of falls? Yes   ASSISTIVE DEVICES UTILIZED TO PREVENT FALLS:  Life alert? No  Use of a cane, walker or w/c? Yes  Grab bars in the bathroom? Yes  Shower chair or bench in shower? No  Elevated toilet seat or a handicapped toilet? No   TIMED UP AND GO:  Was the test performed? No .   Cognitive Function:        05/06/2022    2:45 PM 04/23/2021    2:52 PM  6CIT Screen  What Year? 0 points 0 points  What month?  0 points 0 points  What time? 0 points 0 points  Count back from 20 0 points 0 points  Months in reverse 0 points 0 points  Repeat phrase 0 points 0 points  Total Score 0 points 0 points    Immunizations Immunization History  Administered Date(s) Administered   Fluad Quad(high Dose 65+) 04/19/2018, 06/16/2020, 07/01/2020, 04/16/2021   Influenza Split 06/01/2010   Influenza, High Dose Seasonal PF 05/13/2014, 04/24/2015, 04/12/2017, 04/19/2018, 04/24/2019, 06/16/2020   Influenza, Seasonal, Injecte, Preservative Fre 04/09/2013, 05/03/2016   Influenza,inj,Quad PF,6+ Mos 05/01/2017   Influenza-Unspecified 04/03/2012   Moderna Sars-Covid-2 Vaccination 01/15/2021, 02/12/2021   Pneumococcal Conjugate-13  05/13/2014   Pneumococcal Polysaccharide-23 02/15/2011   Td 08/01/1998   Td (Adult), 2 Lf Tetanus Toxid, Preservative Free 08/01/1998   Tdap 05/01/2004   Unspecified SARS-COV-2 Vaccination 05/11/2021   Zoster Recombinat (Shingrix) 04/24/2019, 08/13/2019   Zoster, Live 02/15/2011    TDAP status: Due, Education has been provided regarding the importance of this vaccine. Advised may receive this vaccine at local pharmacy or Health Dept. Aware to provide a copy of the vaccination record if obtained from local pharmacy or Health Dept. Verbalized acceptance and understanding.  Flu Vaccine status: Due, Education has been provided regarding the importance of this vaccine. Advised may receive this vaccine at local pharmacy or Health Dept. Aware to provide a copy of the vaccination record if obtained from local pharmacy or Health Dept. Verbalized acceptance and understanding.  Pneumococcal vaccine status: Up to date  Covid-19 vaccine status: Completed vaccines  Qualifies for Shingles Vaccine? Yes   Zostavax completed Yes   Shingrix Completed?: Yes  Screening Tests Health Maintenance  Topic Date Due   COVID-19 Vaccine (4 - Mixed Product risk series) 07/06/2021   OPHTHALMOLOGY EXAM  10/13/2021   INFLUENZA VACCINE  03/01/2022   TETANUS/TDAP  04/06/2023 (Originally 05/01/2014)   HEMOGLOBIN A1C  10/04/2022   MAMMOGRAM  10/23/2022   Diabetic kidney evaluation - GFR measurement  12/30/2022   Diabetic kidney evaluation - Urine ACR  12/30/2022   DEXA SCAN  02/12/2023   FOOT EXAM  04/06/2023   Pneumonia Vaccine 31+ Years old  Completed   Zoster Vaccines- Shingrix  Completed   HPV VACCINES  Aged Out   COLONOSCOPY (Pts 45-11yr Insurance coverage will need to be confirmed)  Discontinued   Hepatitis C Screening  Discontinued    Health Maintenance  Health Maintenance Due  Topic Date Due   COVID-19 Vaccine (4 - Mixed Product risk series) 07/06/2021   OPHTHALMOLOGY EXAM  10/13/2021   INFLUENZA  VACCINE  03/01/2022    Colorectal cancer screening: No longer required.   Mammogram status: Completed 10/22/21. Repeat every year  Bone Density status: Completed 02/11/21. Results reflect: Bone density results: NORMAL. Repeat every 2 years.   Additional Screening:  Hepatitis C Screening: does not qualify;   Vision Screening: Recommended annual ophthalmology exams for early detection of glaucoma and other disorders of the eye. Is the patient up to date with their annual eye exam?  No appt set for 07/2022 Who is the provider or what is the name of the office in which the patient attends annual eye exams? Dr GKaty Fitch If pt is not established with a provider, would they like to be referred to a provider to establish care? No .   Dental Screening: Recommended annual dental exams for proper oral hygiene  Community Resource Referral / Chronic Care Management: CRR required this visit?  No   CCM required this visit?  No      Plan:     I have personally reviewed and noted the following in the patient's chart:   Medical and social history Use of alcohol, tobacco or illicit drugs  Current medications and supplements including opioid prescriptions. Patient is not currently taking opioid prescriptions. Functional ability and status Nutritional status Physical activity Advanced directives List of other physicians Hospitalizations, surgeries, and ER visits in previous 12 months Vitals Screenings to include cognitive, depression, and falls Referrals and appointments  In addition, I have reviewed and discussed with patient certain preventive protocols, quality metrics, and best practice recommendations. A written personalized care plan for preventive services as well as general preventive health recommendations were provided to patient.     Willette Brace, LPN   [id number]   Nurse Notes: none

## 2022-05-06 NOTE — Patient Instructions (Signed)
[name] , Thank you for taking time to come for your Medicare Wellness Visit. I appreciate your ongoing commitment to your health goals. Please review the following plan we discussed and let me know if I can assist you in the future.   These are the goals we discussed:  Goals      Patient Stated     Increase energy      Patient Stated     Get rid of sinus issues         This is a list of the screening recommended for you and due dates:  Health Maintenance  Topic Date Due   COVID-19 Vaccine (4 - Mixed Product risk series) 07/06/2021   Eye exam for diabetics  10/13/2021   Flu Shot  03/01/2022   Tetanus Vaccine  04/06/2023*   Hemoglobin A1C  10/04/2022   Mammogram  10/23/2022   Yearly kidney function blood test for diabetes  12/30/2022   Yearly kidney health urinalysis for diabetes  12/30/2022   DEXA scan (bone density measurement)  02/12/2023   Complete foot exam   04/06/2023   Pneumonia Vaccine  Completed   Zoster (Shingles) Vaccine  Completed   HPV Vaccine  Aged Out   Colon Cancer Screening  Discontinued   Hepatitis C Screening: USPSTF Recommendation to screen - Ages 66-79 yo.  Discontinued  *Topic was postponed. The date shown is not the original due date.    Advanced directives: Advance directive discussed with you today. Even though you declined this today please call our office should you change your mind and we can give you the proper paperwork for you to fill out.  Conditions/risks identified: get rid of theses sinus issues   Next appointment: Follow up in one year for your annual wellness visit    Preventive Care 65 Years and Older, Female Preventive care refers to lifestyle choices and visits with your health care provider that can promote health and wellness. What does preventive care include? A yearly physical exam. This is also called an annual well check. Dental exams once or twice a year. Routine eye exams. Ask your health care provider how often you  should have your eyes checked. Personal lifestyle choices, including: Daily care of your teeth and gums. Regular physical activity. Eating a healthy diet. Avoiding tobacco and drug use. Limiting alcohol use. Practicing safe sex. Taking low-dose aspirin every day. Taking vitamin and mineral supplements as recommended by your health care provider. What happens during an annual well check? The services and screenings done by your health care provider during your annual well check will depend on your age, overall health, lifestyle risk factors, and family history of disease. Counseling  Your health care provider may ask you questions about your: Alcohol use. Tobacco use. Drug use. Emotional well-being. Home and relationship well-being. Sexual activity. Eating habits. History of falls. Memory and ability to understand (cognition). Work and work Statistician. Reproductive health. Screening  You may have the following tests or measurements: Height, weight, and BMI. Blood pressure. Lipid and cholesterol levels. These may be checked every 5 years, or more frequently if you are over 69 years old. Skin check. Lung cancer screening. You may have this screening every year starting at age 29 if you have a 30-pack-year history of smoking and currently smoke or have quit within the past 15 years. Fecal occult blood test (FOBT) of the stool. You may have this test every year starting at age 34. Flexible sigmoidoscopy or colonoscopy. You may  have a sigmoidoscopy every 5 years or a colonoscopy every 10 years starting at age 88. Hepatitis C blood test. Hepatitis B blood test. Sexually transmitted disease (STD) testing. Diabetes screening. This is done by checking your blood sugar (glucose) after you have not eaten for a while (fasting). You may have this done every 1-3 years. Bone density scan. This is done to screen for osteoporosis. You may have this done starting at age 37. Mammogram. This may  be done every 1-2 years. Talk to your health care provider about how often you should have regular mammograms. Talk with your health care provider about your test results, treatment options, and if necessary, the need for more tests. Vaccines  Your health care provider may recommend certain vaccines, such as: Influenza vaccine. This is recommended every year. Tetanus, diphtheria, and acellular pertussis (Tdap, Td) vaccine. You may need a Td booster every 10 years. Zoster vaccine. You may need this after age 77. Pneumococcal 13-valent conjugate (PCV13) vaccine. One dose is recommended after age 47. Pneumococcal polysaccharide (PPSV23) vaccine. One dose is recommended after age 76. Talk to your health care provider about which screenings and vaccines you need and how often you need them. This information is not intended to replace advice given to you by your health care provider. Make sure you discuss any questions you have with your health care provider. Document Released: 08/14/2015 Document Revised: 04/06/2016 Document Reviewed: 05/19/2015 Elsevier Interactive Patient Education  2017 [place] Prevention in the Home Falls can cause injuries. They can happen to people of all ages. There are many things you can do to make your home safe and to help prevent falls. What can I do on the outside of my home? Regularly fix the edges of walkways and driveways and fix any cracks. Remove anything that might make you trip as you walk through a door, such as a raised step or threshold. Trim any bushes or trees on the path to your home. Use bright outdoor lighting. Clear any walking paths of anything that might make someone trip, such as rocks or tools. Regularly check to see if handrails are loose or broken. Make sure that both sides of any steps have handrails. Any raised decks and porches should have guardrails on the edges. Have any leaves, snow, or ice cleared regularly. Use sand or salt  on walking paths during winter. Clean up any spills in your garage right away. This includes oil or grease spills. What can I do in the bathroom? Use night lights. Install grab bars by the toilet and in the tub and shower. Do not use towel bars as grab bars. Use non-skid mats or decals in the tub or shower. If you need to sit down in the shower, use a plastic, non-slip stool. Keep the floor dry. Clean up any water that spills on the floor as soon as it happens. Remove soap buildup in the tub or shower regularly. Attach bath mats securely with double-sided non-slip rug tape. Do not have throw rugs and other things on the floor that can make you trip. What can I do in the bedroom? Use night lights. Make sure that you have a light by your bed that is easy to reach. Do not use any sheets or blankets that are too big for your bed. They should not hang down onto the floor. Have a firm chair that has side arms. You can use this for support while you get dressed. Do not have throw rugs  and other things on the floor that can make you trip. What can I do in the kitchen? Clean up any spills right away. Avoid walking on wet floors. Keep items that you use a lot in easy-to-reach places. If you need to reach something above you, use a strong step stool that has a grab bar. Keep electrical cords out of the way. Do not use floor polish or wax that makes floors slippery. If you must use wax, use non-skid floor wax. Do not have throw rugs and other things on the floor that can make you trip. What can I do with my stairs? Do not leave any items on the stairs. Make sure that there are handrails on both sides of the stairs and use them. Fix handrails that are broken or loose. Make sure that handrails are as long as the stairways. Check any carpeting to make sure that it is firmly attached to the stairs. Fix any carpet that is loose or worn. Avoid having throw rugs at the top or bottom of the stairs. If you  do have throw rugs, attach them to the floor with carpet tape. Make sure that you have a light switch at the top of the stairs and the bottom of the stairs. If you do not have them, ask someone to add them for you. What else can I do to help prevent falls? Wear shoes that: Do not have high heels. Have rubber bottoms. Are comfortable and fit you well. Are closed at the toe. Do not wear sandals. If you use a stepladder: Make sure that it is fully opened. Do not climb a closed stepladder. Make sure that both sides of the stepladder are locked into place. Ask someone to hold it for you, if possible. Clearly mark and make sure that you can see: Any grab bars or handrails. First and last steps. Where the edge of each step is. Use tools that help you move around (mobility aids) if they are needed. These include: Canes. Walkers. Scooters. Crutches. Turn on the lights when you go into a dark area. Replace any light bulbs as soon as they burn out. Set up your furniture so you have a clear path. Avoid moving your furniture around. If any of your floors are uneven, fix them. If there are any pets around you, be aware of where they are. Review your medicines with your doctor. Some medicines can make you feel dizzy. This can increase your chance of falling. Ask your doctor what other things that you can do to help prevent falls. This information is not intended to replace advice given to you by your health care provider. Make sure you discuss any questions you have with your health care provider. Document Released: 05/14/2009 Document Revised: 12/24/2015 Document Reviewed: 08/22/2014 Elsevier Interactive Patient Education  2017 [employer].

## 2022-05-10 ENCOUNTER — Ambulatory Visit (INDEPENDENT_AMBULATORY_CARE_PROVIDER_SITE_OTHER): Payer: Medicare Other | Admitting: *Deleted

## 2022-05-10 ENCOUNTER — Telehealth: Payer: Self-pay

## 2022-05-10 DIAGNOSIS — Z23 Encounter for immunization: Secondary | ICD-10-CM

## 2022-05-10 NOTE — Telephone Encounter (Signed)
**Note De-Identified  Obfuscation** A providers page of a [place] [facility] [place] pt asst application for [facility] [place] was received from [facility] with a request for [place] to complete, have Dr Johney Frame sign/date it, and to fax it back to them. This is a refill request to continue the pts [place] assistance.  I have completed the providers page and have e-mailed it to Dr Jacolyn Reedy nurse so she can obtain her signature, date it, and to fax to [facility] at the fax number written on the cover letter included.

## 2022-05-10 NOTE — Telephone Encounter (Signed)
Forms signed, dated, and completed by Dr. Johney Frame.   Forms faxed to contact information provided on cover sheet, with confirmation.   Will make PA Nurse aware of completion.

## 2022-05-26 ENCOUNTER — Other Ambulatory Visit: Payer: Self-pay

## 2022-05-26 MED ORDER — ENTRESTO 49-51 MG PO TABS: 1.0000 | ORAL_TABLET | Freq: Two times a day (BID) | ORAL | 1 refills | Status: DC

## 2022-06-03 ENCOUNTER — Ambulatory Visit
Admission: RE | Admit: 2022-06-03 | Discharge: 2022-06-03 | Disposition: A | Discharge status: Home / Self Care | Payer: Medicare Other | Source: Ambulatory Visit | Attending: Otolaryngology | Admitting: Otolaryngology

## 2022-06-03 DIAGNOSIS — J329 Chronic sinusitis, unspecified: Secondary | ICD-10-CM

## 2022-06-07 NOTE — Progress Notes (Unsigned)
Cardiology Office Note:    Date:  06/07/2022   ID:  [name], DOB 1946-06-14, MRN [id number]  PCP:  Leamon Arnt, MD   [place]  Cardiologist:  Freada Bergeron, MD  Advanced Practice Provider:  No care team member to display Electrophysiologist:  None    Referring MD: Leamon Arnt, MD    History of Present Illness:    [name] is a 76 y.o. 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 She now presents to clinic for follow-up.   Patient was diagnosed with new onset cardiomyopathy in 08/2020 when she presented with SOB found to have COVID at that time. TTE with LVEF 20-25%. Was initially stable and discharged home but returned to [facility] on 08/10/2020 with progressive shortness of breath and LE edema requiring ICU admission and intubation. She was aggressively diuresed and extubated and evenutally weaned to RA. Initial weight was 205lbs and she was diuresed to 187lbs. She was discharged home on lasix '40mg'$  daily.   After discharge, we arranged for coronary angiography on 09/07/20 for work-up of newly diagnosed HFrEF which revealed no significant obstructive disease. She returned to clinic on 09/21/20 where she felt overall improved. Had DOE with stairs. Wt was stable and blood pressure was well controlled. Repeat TTE 10/15/20 with mild improvement in LVEF to 30-35%, G2DD, elevated LAP, normal RA, mildly dilated LA, mild MR. Cardiac monitor that was placed for frequent ventricular ectopy revealed several runs of NSVT and frequent PVCs but no sustained arrhythmias or Afib.  During visit on 10/20/20, she was overall feeling better. She was euvolemic. We increased her entresto and started spironolactone. Echo 01/27/21 LVEF 45%, RV normal  size and function, trivial MR.   Last seen in clinic 10/2021 where she was doing well from a CV standpoint.   Today, ***  Past Medical History:  Diagnosis Date   Arthritis    Chronic back pain    buldging disc,scoliosis,arthritis   Congestive heart failure with left ventricular systolic dysfunction ([place]) 08/24/2020   Diabetes mellitus without complication (HCC)    takes Trulicity,Jardiance,and Metformin daily.Average fasting blood sugar runs around130   GERD (gastroesophageal reflux disease)    takes Omeprazole daily   Gout 07/19/2021   On allopurinol   History of bronchitis > 8 yrs ago   History of shingles    HTN (hypertension)    takes Amlodipine and Micardis daily   Hx of colonic polyps    benign   Internal and external hemorrhoids without complication    Joint pain    Mitral valve prolapse    Nocturia    PONV (postoperative nausea and vomiting)    when gets injections in joints gets hives.Betadine rash   Restless leg syndrome    takes Requip at bedtime   Seasonal allergies    takes Claritin daily as needed   Urinary frequency    Uterine fibroid     Past Surgical History:  Procedure Laterality Date   BUNIONECTOMY Bilateral    COLONOSCOPY  07/12/2004   diverticulosis, internal and external hemorrhoids   COLONOSCOPY     FINGER ARTHROSCOPY WITH CARPOMETACARPEL ([place]) ARTHROPLASTY Right 09/28/2015   Procedure: RIGHT THUMB TRAPEZIUM EXCISION WITH CARPOMETACARPEL ([place]) ARTHROPLASTY AND TENDON TRANSFER;  Surgeon: Iran Planas,  MD;  Location: [facility];  Service: Orthopedics;  Laterality: Right;   HEMORRHOID SURGERY     almost 40 yrs ago   HYSTEROSCOPY WITH D & C  08/23/2000   and resectoscopic myomectomy   LUMBAR DISC SURGERY  03/16/2005   LUMBAR EPIDURAL INJECTION     PLANTAR FASCIA RELEASE Left 02/03/2010   and torn tendon   REVERSE SHOULDER ARTHROPLASTY Right 12/14/2020   Procedure: REVERSE SHOULDER ARTHROPLASTY;  Surgeon: Netta Cedars, MD;  Location: [facility];  Service:  Orthopedics;  Laterality: Right;   RIGHT/LEFT HEART CATH AND CORONARY ANGIOGRAPHY N/A 09/07/2020   Procedure: RIGHT/LEFT HEART CATH AND CORONARY ANGIOGRAPHY;  Surgeon: Nelva Bush, MD;  Location: [facility];  Service: Cardiovascular;  Laterality: N/A;   TENDON TRANSFER Right 09/28/2015   Procedure: TENDON TRANSFER;  Surgeon: Iran Planas, MD;  Location: [facility];  Service: Orthopedics;  Laterality: Right;   TONSILLECTOMY     TUBAL LIGATION     WRIST SURGERY     left, removal of cyst    Current Medications: No outpatient medications have been marked as taking for the 06/09/22 encounter (Appointment) with Freada Bergeron, MD.     Allergies:   Amlodipine, Depo-medrol [methylprednisolone], Gabapentin, Levothyroxine, Sitagliptin, Ace inhibitors, Betadine [povidone iodine], Codeine, Cortizone-10 [hydrocortisone], Tetracyclines & related, and Dexamethasone   Social History   Socioeconomic History   Marital status: Married    Spouse name: Not on file   Number of children: 3   Years of education: Not on file   Highest education level: Not on file  Occupational History   Occupation: Retired   Tobacco Use   Smoking status: Never   Smokeless tobacco: Never  Vaping Use   Vaping Use: Never used  Substance and Sexual Activity   Alcohol use: No    Alcohol/week: 0.0 standard drinks of alcohol   Drug use: No   Sexual activity: Yes  Other Topics Concern   Not on file  Social History Narrative   1 caffeine drinks daily         Social Determinants of Health   Financial Resource Strain: Low Risk  (05/06/2022)   Overall Financial Resource Strain (CARDIA)    Difficulty of Paying Living Expenses: Not hard at all  Food Insecurity: No Food Insecurity (05/06/2022)   Hunger Vital Sign    Worried About Running Out of Food in the Last Year: Never true    Ran Out of Food in the Last Year: Never true  Transportation Needs: No Transportation Needs (05/06/2022)   PRAPARE - Armed forces logistics/support/administrative officer (Medical): No    Lack of Transportation (Non-Medical): No  Physical Activity: Insufficiently Active (05/06/2022)   Exercise Vital Sign    Days of Exercise per Week: 2 days    Minutes of Exercise per Session: 30 min  Stress: No Stress Concern Present (05/06/2022)   [place]    Feeling of Stress : Not at all  Social Connections: Moderately Integrated (05/06/2022)   Social Connection and Isolation Panel [NHANES]    Frequency of Communication with Friends and Family: More than three times a week    Frequency of Social Gatherings with Friends and Family: More than three times a week    Attends Religious Services: More than 4 times per year    Active Member of [employer] or Organizations: No    Attends Archivist Meetings: Never    Marital Status: Married  Family History: The patient's family history includes Pancreatic cancer in her father. There is no history of Colon cancer or Breast cancer.  ROS:   Please see the history of present illness.    (+) dyspnea on exertion All other systems reviewed and are negative.  EKGs/Labs/Other Studies Reviewed:    The following studies were reviewed today: TTE 2022-01-28: IMPRESSIONS   1. Left ventricular ejection fraction, by estimation, is 45 to 50%. The  left ventricle has mildly decreased function. The left ventricle  demonstrates global hypokinesis. Left ventricular diastolic parameters are  consistent with Grade I diastolic  dysfunction (impaired relaxation).   2. Right ventricular systolic function is normal. The right ventricular  size is normal. Tricuspid regurgitation signal is inadequate for assessing  PA pressure.   3. The mitral valve is grossly normal. Trivial mitral valve  regurgitation. No evidence of mitral stenosis.   4. The aortic valve is tricuspid. Aortic valve regurgitation is not  visualized. No aortic stenosis is present.    5. The inferior vena cava is normal in size with greater than 50%  respiratory variability, suggesting right atrial pressure of 3 mmHg.   Comparison(s): No significant change from prior study.   TTE 01/26/21 1. Left ventricular ejection fraction, by visual estimation, is 45%. The left ventricle has mildly decreased function. The average left ventricular global longitudinal strain is -18.5 % (with resoanable trace and only slight foreshortening) suggestive of near normal LV function. The left ventricle demonstrates global hypokinesis, though anterolateral walls and inferolateral walls  hypokinesis is most prominent. Left ventricular diastolic parameters are indeterminate in this study.   2. Right ventricular systolic function is normal. The right ventricular size is normal. Tricuspid regurgitation signal is inadequate for assessing PA pressure.   3. The mitral valve is grossly normal. Trivial mitral valve  regurgitation. No evidence of mitral stenosis.   4. The aortic valve is tricuspid. Aortic valve regurgitation is not  visualized. No aortic stenosis is present.   TTE 10/15/20: 1. Left ventricular ejection fraction, by estimation, is 30 to 35%. The left ventricle has moderately decreased function. The left ventricle demonstrates global hypokinesis. Left ventricular diastolic parameters are consistent with Grade II diastolic dysfunction (pseudonormalization). Elevated left atrial pressure. The E/e' is 22.0.   2. Right ventricular systolic function is normal. The right ventricular size is normal. Tricuspid regurgitation signal is inadequate for assessing PA pressure.   3. Left atrial size was mildly dilated.   4. The mitral valve is grossly normal. Mild mitral valve regurgitation. No evidence of mitral stenosis.   5. The aortic valve is tricuspid. Aortic valve regurgitation is not visualized. No aortic stenosis is present.   6. The inferior vena cava is normal in size with greater than 50%  respiratory variability, suggesting right atrial pressure of 3 mmHg.    Cardiac Monitor 10/09/20: Patient's monitoring period was from 09/08/20-10/07/20 Predominant rhythm was NSR with frequent PVCs. Average HR 84bpm (ranged from 59-84bpm) There were several runs of NSVT, however, no sustained VT (longest episode was 11 beats) Frequent ventricular ectopy was detected (10%; 314,307 beats) SVE was rare <1% No afib or significant pauses Patient triggered events correlated with PVCs and NSVT.   TTE 08/14/20:  1. Left ventricular ejection fraction, by estimation, is 20 to 25%. The left ventricle has severely decreased function. The left ventricle has no regional wall motion abnormalities. Left ventricular diastolic parameters  are indeterminate.   2. Right ventricular systolic function was not well visualized. The right ventricular  size is not well visualized.   3. Left atrial size was moderately dilated.   4. The mitral valve is grossly normal. Mild mitral valve regurgitation. There is a second jet of mitral regurgitation seen in the four chamber view with eccentric pattern, mitral regurgitation may be underestimated in  this study.   5. The aortic valve is tricuspid. Aortic valve regurgitation is not visualized. No aortic stenosis is present.   6. The inferior vena cava is dilated in size with <50% respiratory variability, suggesting right atrial pressure of 15 mmHg.    TTE 08/04/20 IMPRESSIONS   1. Left ventricular ejection fraction, by estimation, is 20 to 25%. The left ventricle has severely decreased function. The left ventricle demonstrates global hypokinesis. The left ventricular internal cavity size  was mildly to moderately dilated. Left  ventricular diastolic parameters are consistent with Grade II diastolic dysfunction (pseudonormalization). Elevated left atrial pressure.   2. Right ventricular systolic function is normal. The right ventricular size is normal. There is mildly elevated  pulmonary artery systolic pressure. The estimated right ventricular systolic pressure is 63.7 mmHg.   3. Left atrial size was moderately dilated.   4. The mitral valve is degenerative. Mild mitral valve regurgitation. No evidence of mitral stenosis.   5. The aortic valve is tricuspid. There is mild calcification of the aortic valve. There is mild thickening of the aortic valve. Aortic valve regurgitation is not visualized. Mild aortic valve sclerosis is present,  with no evidence of aortic valve stenosis.   6. The inferior vena cava is dilated in size with <50% respiratory variability, suggesting right atrial pressure of 15 mmHg.   FINDINGS   Left Ventricle: Left ventricular ejection fraction, by estimation, is 20  to 25%. The left ventricle has severely decreased function. The left  ventricle demonstrates global hypokinesis. The left ventricular internal  cavity size was mildly to moderately  dilated. There is no left ventricular hypertrophy. Left ventricular  diastolic parameters are consistent with Grade II diastolic dysfunction  (pseudonormalization). Elevated left atrial pressure.   Right Ventricle: The right ventricular size is normal. No increase in  right ventricular wall thickness. Right ventricular systolic function is  normal. There is mildly elevated pulmonary artery systolic pressure. The  tricuspid regurgitant velocity is 2.39   m/s, and with an assumed right atrial pressure of 15 mmHg, the estimated  right ventricular systolic pressure is 85.8 mmHg.   Left Atrium: Left atrial size was moderately dilated.   Right Atrium: Right atrial size was normal in size.   Pericardium: Trivial pericardial effusion is present.   Mitral Valve: The mitral valve is degenerative in appearance. Mild mitral  annular calcification. Mild mitral valve regurgitation. No evidence of  mitral valve stenosis.   Tricuspid Valve: The tricuspid valve is grossly normal. Tricuspid valve  regurgitation  is mild . No evidence of tricuspid stenosis.   Aortic Valve: The aortic valve is tricuspid. There is mild calcification  of the aortic valve. There is mild thickening of the aortic valve. Aortic  valve regurgitation is not visualized. Mild aortic valve sclerosis is  present, with no evidence of aortic  valve stenosis.   Pulmonic Valve: The pulmonic valve was grossly normal. Pulmonic valve  regurgitation is trivial. No evidence of pulmonic stenosis.   Aorta: The aortic root and ascending aorta are structurally normal, with  no evidence of dilitation.   Venous: The inferior vena cava is dilated in size with less than 50%  respiratory variability, suggesting right atrial pressure  of 15 mmHg.   IAS/Shunts: The atrial septum is grossly normal.     RHC/LHC 09/07/20: Conclusions: Mild plaquing of the mid LAD with 10-20% stenosis.  Otherwise, no angiographically significant coronary artery disease. Normal left and right heart filling pressures. Borderline elevated pulmonary artery pressure. Normal Fick cardiac output/index.   Recommendations: Maintain net even fluid balance and optimize goal directed medical therapy for non-ischemic cardiomyopathy. Primary    EKG:   08/06/21: NSR with HR 69, 1 degree AVB 11/11/2020: EKG is not ordered today.  11/05/2021: not ordered today    Recent Labs: 09/28/2021: ALT 16; Hemoglobin 12.4; Platelets 293.0; TSH 1.38 12/29/2021: BUN 26; Creatinine, Ser 1.27; Potassium 4.2; Sodium 139  Recent Lipid Panel    Component Value Date/Time   CHOL 111 09/28/2021 1019   TRIG 91.0 09/28/2021 1019   HDL 55.70 09/28/2021 1019   CHOLHDL 2 09/28/2021 1019   VLDL 18.2 09/28/2021 1019   LDLCALC 37 09/28/2021 1019      Physical Exam:    VS:  There were no vitals taken for this visit.    Wt Readings from Last 3 Encounters:  05/06/22 195 lb (88.5 kg)  04/05/22 195 lb 6.4 oz (88.6 kg)  12/29/21 197 lb 3.2 oz (89.4 kg)     GEN: Well nourished, well developed  in no acute distress HEENT: Normal NECK: No JVD; No carotid bruits CARDIAC: RRR, 1/6 systolic murmur, no rubs, gallops RESPIRATORY:  Clear to auscultation without rales, wheezing or rhonchi  ABDOMEN: Soft, non-tender, non-distended MUSCULOSKELETAL:  No edema; No deformity  SKIN: Warm and dry NEUROLOGIC:  Alert and oriented x 3 PSYCHIATRIC:  Normal affect   ASSESSMENT:    No diagnosis found.    PLAN:    In order of problems listed above:  No problem-specific Assessment & Plan notes found for this encounter.    #Newly diagnosed HFrEF:  #Non-ischemic cardiomypathy: Diagnosed in the setting of COVID-19 pneumonia. Cath with mild, non-obstructive disease. Initial TTE with LVEF 20-25%-->30-35%-->45%. Currently,  patient is doing well with NYHA class II symptoms. Euvolemic. -Continue lasix '20mg'$  4x/wk (M, W, F, Sat) -Continue coreg 6.'25mg'$  BID -Continue entresto to 49/'51mg'$  BID -Continue farxiga '10mg'$  daily -Continue spironolactone 12.'5mg'$  daily  -Cath without obstructive disease -EF improved, no need for ICD -Continue daily weights and call if gaining >2-3lbs in 2 days or 5lbs in 1 week -Low Na diet -Will look into patient assistance for entresto and farxiga   #Non-obstructive CAD: Cath on 09/07/20 with mild plaquing of the mid LAD with 10-20% stenosis.  Otherwise, no angiographically significant coronary artery disease. -Continue crestor '20mg'$  daily   #PVCs/NSVT: Cardiac monitor with several runs of NSVT (longest 11 beats) and frequent (10%) burden of PVCs. No sustained arrhythmias. -Continue coreg  6.'25mg'$  BID -Manage HF as above   #CKD: Cr back to baseline. -Lasix as above -Follow-up with PCP as scheduled for labs   #DMII: -Continue farxiga   Medication Adjustments/Labs and Tests Ordered: Current medicines are reviewed at length with the patient today.  Concerns regarding medicines are outlined above.  No orders of the defined types were placed in this encounter.  No  orders of the defined types were placed in this encounter.   There are no Patient Instructions on file for this visit.   Follow-up in 6 months.   Signed, Freada Bergeron, MD  06/07/2022 8:39 PM    [place]

## 2022-06-09 ENCOUNTER — Encounter: Payer: Self-pay | Admitting: Cardiology

## 2022-06-09 ENCOUNTER — Ambulatory Visit: Payer: Medicare Other | Attending: Cardiology | Admitting: Cardiology

## 2022-06-09 VITALS — BP 138/78 | HR 79 | Ht 64.0 in | Wt 197.0 lb

## 2022-06-09 DIAGNOSIS — I428 Other cardiomyopathies: Secondary | ICD-10-CM | POA: Insufficient documentation

## 2022-06-09 DIAGNOSIS — I2510 Atherosclerotic heart disease of native coronary artery without angina pectoris: Secondary | ICD-10-CM | POA: Insufficient documentation

## 2022-06-09 DIAGNOSIS — N1831 Chronic kidney disease, stage 3a: Secondary | ICD-10-CM | POA: Insufficient documentation

## 2022-06-09 DIAGNOSIS — I493 Ventricular premature depolarization: Secondary | ICD-10-CM | POA: Diagnosis present

## 2022-06-09 DIAGNOSIS — E119 Type 2 diabetes mellitus without complications: Secondary | ICD-10-CM | POA: Insufficient documentation

## 2022-06-09 DIAGNOSIS — I152 Hypertension secondary to endocrine disorders: Secondary | ICD-10-CM | POA: Diagnosis present

## 2022-06-09 DIAGNOSIS — I5020 Unspecified systolic (congestive) heart failure: Secondary | ICD-10-CM | POA: Diagnosis not present

## 2022-06-09 DIAGNOSIS — E1159 Type 2 diabetes mellitus with other circulatory complications: Secondary | ICD-10-CM | POA: Diagnosis not present

## 2022-06-09 DIAGNOSIS — Z01810 Encounter for preprocedural cardiovascular examination: Secondary | ICD-10-CM | POA: Diagnosis present

## 2022-06-09 MED ORDER — CARVEDILOL 3.125 MG PO TABS: 3.1250 mg | ORAL_TABLET | Freq: Two times a day (BID) | ORAL | 1 refills | Status: DC

## 2022-06-09 NOTE — Patient Instructions (Signed)
Medication Instructions:   DECREASE YOUR CARVEDILOL TO 3.125 MG BY MOUTH TWICE DAILY  *If you need a refill on your cardiac medications before your next appointment, please call your pharmacy*    Follow-Up: At [facility], you and your health needs are our priority.  As part of our continuing mission to provide you with exceptional heart care, we have created designated Provider Care Teams.  These Care Teams include your primary Cardiologist (physician) and Advanced Practice Providers (APPs -  Physician Assistants and Nurse Practitioners) who all work together to provide you with the care you need, when you need it.  We recommend signing up for the patient portal called "MyChart".  Sign up information is provided on this After Visit Summary.  MyChart is used to connect with patients for Virtual Visits (Telemedicine).  Patients are able to view lab/test results, encounter notes, upcoming appointments, etc.  Non-urgent messages can be sent to your provider as well.   To learn more about what you can do with MyChart, go to [email or web address].    Your next appointment:   6 month(s)  The format for your next appointment:   In Person  Provider:   Freada Bergeron, MD      Important Information About Sugar

## 2022-06-09 NOTE — Progress Notes (Signed)
Cardiology Office Note:    Date:  06/09/2022   ID:  KYOMI HECTOR, DOB 1946/02/22, MRN [id number]  PCP:  Leamon Arnt, MD   [place]  Cardiologist:  Freada Bergeron, MD  Advanced Practice Provider:  No care team member to display Electrophysiologist:  None   Referring MD: Leamon Arnt, MD    History of Present Illness:    [name] is a 76 y.o. 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 She now presents to clinic for follow-up.   Patient was diagnosed with new onset cardiomyopathy in 08/2020 when she presented with SOB found to have COVID at that time. TTE with LVEF 20-25%. Was initially stable and discharged home but returned to [facility] on 08/10/2020 with progressive shortness of breath and LE edema requiring ICU admission and intubation. She was aggressively diuresed and extubated and evenutally weaned to RA. Initial weight was 205lbs and she was diuresed to 187lbs. She was discharged home on lasix '40mg'$  daily.   After discharge, we arranged for coronary angiography on 09/07/20 for work-up of newly diagnosed HFrEF which revealed no significant obstructive disease. She returned to clinic on 09/21/20 where she felt overall improved. Had DOE with stairs. Wt was stable and blood pressure was well controlled. Repeat TTE 10/15/20 with mild improvement in LVEF to 30-35%, G2DD, elevated LAP, normal RA, mildly dilated LA, mild MR. Cardiac monitor that was placed for frequent ventricular ectopy revealed several runs of NSVT and frequent PVCs but no sustained arrhythmias or Afib.  During visit on 10/20/20, she was overall feeling better. She was euvolemic. We increased her entresto and started spironolactone. Echo 01/27/21 LVEF 45%, RV normal  size and function, trivial MR.   Last seen in clinic 10/2021 where she was doing well from a CV standpoint.   Today, the patient is here for preoperative evaluation prior to sinus surgery/ Her surgery 08/13/2022 will be for recurrent sinus infections.and acute sinusitis. She has struggled with sinus issues for years. She follows with Dr. Benjamine Mola.  From a CV standpoint, she is doing very well. No chest pain, SOB, orthopnea or PND. Tolerating medications. She monitors her weight at home and has not noticed fluid retention. Lately her LE edema has been managed with taking her Lasix on Mondays, Wednesdays, Fridays, and Saturdays.  In clinic today her blood pressure is 138/78. At her last visit her coreg was increased. However, due to having lower blood pressures she returned to taking her previous dose.  She denies any palpitations, or chest pain. No lightheadedness, headaches, syncope, orthopnea, or PND.   Past Medical History:  Diagnosis Date   Arthritis    Chronic back pain    buldging disc,scoliosis,arthritis   Congestive heart failure with left ventricular systolic dysfunction ([place]) 08/24/2020   Diabetes mellitus without complication (HCC)    takes Trulicity,Jardiance,and Metformin daily.Average fasting blood sugar runs around130   GERD (gastroesophageal reflux disease)    takes Omeprazole daily   Gout 07/19/2021   On allopurinol   History of bronchitis > 8 yrs ago   History of shingles    HTN (hypertension)    takes Amlodipine and Micardis daily   Hx of colonic polyps    benign   Internal and external hemorrhoids without complication  Joint pain    Mitral valve prolapse    Nocturia    PONV (postoperative nausea and vomiting)    when gets injections in joints gets hives.Betadine rash   Restless leg syndrome    takes Requip at bedtime   Seasonal allergies    takes Claritin daily as needed   Urinary frequency    Uterine fibroid     Past Surgical History:  Procedure  Laterality Date   BUNIONECTOMY Bilateral    COLONOSCOPY  07/12/2004   diverticulosis, internal and external hemorrhoids   COLONOSCOPY     FINGER ARTHROSCOPY WITH CARPOMETACARPEL ([place]) ARTHROPLASTY Right 09/28/2015   Procedure: RIGHT THUMB TRAPEZIUM EXCISION WITH CARPOMETACARPEL ([place]) ARTHROPLASTY AND TENDON TRANSFER;  Surgeon: Iran Planas, MD;  Location: [place];  Service: Orthopedics;  Laterality: Right;   HEMORRHOID SURGERY     almost 40 yrs ago   HYSTEROSCOPY WITH D & C  08/23/2000   and resectoscopic myomectomy   LUMBAR DISC SURGERY  03/16/2005   LUMBAR EPIDURAL INJECTION     PLANTAR FASCIA RELEASE Left 02/03/2010   and torn tendon   REVERSE SHOULDER ARTHROPLASTY Right 12/14/2020   Procedure: REVERSE SHOULDER ARTHROPLASTY;  Surgeon: Netta Cedars, MD;  Location: [facility];  Service: Orthopedics;  Laterality: Right;   RIGHT/LEFT HEART CATH AND CORONARY ANGIOGRAPHY N/A 09/07/2020   Procedure: RIGHT/LEFT HEART CATH AND CORONARY ANGIOGRAPHY;  Surgeon: Nelva Bush, MD;  Location: [place] [facility];  Service: Cardiovascular;  Laterality: N/A;   TENDON TRANSFER Right 09/28/2015   Procedure: TENDON TRANSFER;  Surgeon: Iran Planas, MD;  Location: [place];  Service: Orthopedics;  Laterality: Right;   TONSILLECTOMY     TUBAL LIGATION     WRIST SURGERY     left, removal of cyst    Current Medications: Current Meds  Medication Sig   allopurinol (ZYLOPRIM) 100 MG tablet Take 1 tablet (100 mg total) by mouth daily.   Amoxicillin-Pot Clavulanate (AUGMENTIN PO) Take by mouth. 7 days due to sinus infection from ENT   carvedilol (COREG) 3.125 MG tablet Take 1 tablet (3.125 mg total) by mouth 2 (two) times daily.   cholecalciferol (VITAMIN D) 1000 UNITS tablet Take 1,000 Units by mouth daily.   cycloSPORINE (RESTASIS) 0.05 % ophthalmic emulsion Place 1 drop into both eyes 2 (two) times daily.   dapagliflozin propanediol (FARXIGA) 10 MG TABS tablet Take 1 tablet (10 mg total) by mouth daily before  breakfast.   Exenatide ER (BYDUREON BCISE) 2 MG/0.85ML AUIJ Inject 2 mg into the skin once a week.   famotidine (PEPCID) 20 MG tablet Take 20 mg by mouth at bedtime.   fluticasone (FLONASE) 50 MCG/ACT nasal spray Place 1 spray into both nostrils daily.   furosemide (LASIX) 20 MG tablet Take 1 tablet (20 mg total) by mouth as directed. Monday,wed, Friday and sat only.   insulin glargine (LANTUS SOLOSTAR) 100 UNIT/ML Solostar Pen Inject 15 Units into the skin at bedtime.   ipratropium (ATROVENT) 0.06 % nasal spray Place 1 spray into both nostrils 3 (three) times daily.   Magnesium Oxide 400 MG CAPS Take 1 capsule (400 mg total) by mouth daily.   metFORMIN (GLUCOPHAGE) 500 MG tablet Take 1 tablet (500 mg total) by mouth 2 (two) times daily with a meal.   Multiple Vitamin (MULTIVITAMIN WITH MINERALS) TABS tablet Take 1 tablet by mouth daily.   omeprazole (PRILOSEC) 40 MG capsule TAKE 1 CAPSULE (40 MG TOTAL) BY MOUTH DAILY.   pregabalin (LYRICA) 100 MG capsule Take 1  capsule by mouth twice daily   rOPINIRole (REQUIP) 3 MG tablet Take 1 tablet (3 mg total) by mouth at bedtime.   rosuvastatin (CRESTOR) 20 MG tablet Take 1 tablet (20 mg total) by mouth daily.   sacubitril-valsartan (ENTRESTO) 49-51 MG Take 1 tablet by mouth 2 (two) times daily.   spironolactone (ALDACTONE) 25 MG tablet Take 0.5 tablets (12.5 mg total) by mouth daily.   [DISCONTINUED] carvedilol (COREG) 6.25 MG tablet Take 1 tablet (6.25 mg total) by mouth 2 (two) times daily. (Patient taking differently: Take 0.5 tablets by mouth 2 (two) times daily.)     Allergies:   Amlodipine, Depo-medrol [methylprednisolone], Gabapentin, Levothyroxine, Sitagliptin, Ace inhibitors, Betadine [povidone iodine], Codeine, Cortizone-10 [hydrocortisone], Tetracyclines & related, and Dexamethasone   Social History   Socioeconomic History   Marital status: Married    Spouse name: Not on file   Number of children: 3   Years of education: Not on file    Highest education level: Not on file  Occupational History   Occupation: Retired   Tobacco Use   Smoking status: Never   Smokeless tobacco: Never  Vaping Use   Vaping Use: Never used  Substance and Sexual Activity   Alcohol use: No    Alcohol/week: 0.0 standard drinks of alcohol   Drug use: No   Sexual activity: Yes  Other Topics Concern   Not on file  Social History Narrative   1 caffeine drinks daily         Social Determinants of Health   Financial Resource Strain: Low Risk  (05/06/2022)   Overall Financial Resource Strain (CARDIA)    Difficulty of Paying Living Expenses: Not hard at all  Food Insecurity: No Food Insecurity (05/06/2022)   Hunger Vital Sign    Worried About Running Out of Food in the Last Year: Never true    Ran Out of Food in the Last Year: Never true  Transportation Needs: No Transportation Needs (05/06/2022)   PRAPARE - Hydrologist (Medical): No    Lack of Transportation (Non-Medical): No  Physical Activity: Insufficiently Active (05/06/2022)   Exercise Vital Sign    Days of Exercise per Week: 2 days    Minutes of Exercise per Session: 30 min  Stress: No Stress Concern Present (05/06/2022)   [place]    Feeling of Stress : Not at all  Social Connections: Moderately Integrated (05/06/2022)   Social Connection and Isolation Panel [NHANES]    Frequency of Communication with Friends and Family: More than three times a week    Frequency of Social Gatherings with Friends and Family: More than three times a week    Attends Religious Services: More than 4 times per year    Active Member of [employer] or Organizations: No    Attends Archivist Meetings: Never    Marital Status: Married     Family History: The patient's family history includes Pancreatic cancer in her father. There is no history of Colon cancer or Breast cancer.  ROS:   Review of Systems   Constitutional:  Negative for chills and fever.  HENT:  Negative for nosebleeds and tinnitus.   Eyes:  Negative for blurred vision and pain.  Respiratory:  Positive for shortness of breath. Negative for cough, hemoptysis and stridor.   Cardiovascular:  Negative for chest pain, palpitations, orthopnea, claudication, leg swelling and PND.  Gastrointestinal:  Negative for blood in stool, diarrhea, nausea and  vomiting.  Genitourinary:  Negative for dysuria and hematuria.  Musculoskeletal:  Negative for falls.  Neurological:  Negative for dizziness, loss of consciousness and headaches.  Psychiatric/Behavioral:  Negative for depression, hallucinations and substance abuse. The patient does not have insomnia.     EKGs/Labs/Other Studies Reviewed:    The following studies were reviewed today:  TTE 12/2021: IMPRESSIONS   1. Left ventricular ejection fraction, by estimation, is 45 to 50%. The  left ventricle has mildly decreased function. The left ventricle  demonstrates global hypokinesis. Left ventricular diastolic parameters are  consistent with Grade I diastolic  dysfunction (impaired relaxation).   2. Right ventricular systolic function is normal. The right ventricular  size is normal. Tricuspid regurgitation signal is inadequate for assessing  PA pressure.   3. The mitral valve is grossly normal. Trivial mitral valve  regurgitation. No evidence of mitral stenosis.   4. The aortic valve is tricuspid. Aortic valve regurgitation is not  visualized. No aortic stenosis is present.   5. The inferior vena cava is normal in size with greater than 50%  respiratory variability, suggesting right atrial pressure of 3 mmHg.   Comparison(s): No significant change from prior study.   TTE 01/26/21 1. Left ventricular ejection fraction, by visual estimation, is 45%. The left ventricle has mildly decreased function. The average left ventricular global longitudinal strain is -18.5 % (with resoanable  trace and only slight foreshortening) suggestive of near normal LV function. The left ventricle demonstrates global hypokinesis, though anterolateral walls and inferolateral walls  hypokinesis is most prominent. Left ventricular diastolic parameters are indeterminate in this study.   2. Right ventricular systolic function is normal. The right ventricular size is normal. Tricuspid regurgitation signal is inadequate for assessing PA pressure.   3. The mitral valve is grossly normal. Trivial mitral valve  regurgitation. No evidence of mitral stenosis.   4. The aortic valve is tricuspid. Aortic valve regurgitation is not  visualized. No aortic stenosis is present.   TTE 10/15/20: 1. Left ventricular ejection fraction, by estimation, is 30 to 35%. The left ventricle has moderately decreased function. The left ventricle demonstrates global hypokinesis. Left ventricular diastolic parameters are consistent with Grade II diastolic dysfunction (pseudonormalization). Elevated left atrial pressure. The E/e' is 22.0.   2. Right ventricular systolic function is normal. The right ventricular size is normal. Tricuspid regurgitation signal is inadequate for assessing PA pressure.   3. Left atrial size was mildly dilated.   4. The mitral valve is grossly normal. Mild mitral valve regurgitation. No evidence of mitral stenosis.   5. The aortic valve is tricuspid. Aortic valve regurgitation is not visualized. No aortic stenosis is present.   6. The inferior vena cava is normal in size with greater than 50% respiratory variability, suggesting right atrial pressure of 3 mmHg.    Cardiac Monitor 10/09/20: Patient's monitoring period was from 09/08/20-10/07/20 Predominant rhythm was NSR with frequent PVCs. Average HR 84bpm (ranged from 59-84bpm) There were several runs of NSVT, however, no sustained VT (longest episode was 11 beats) Frequent ventricular ectopy was detected (10%; 314,307 beats) SVE was rare <1% No afib  or significant pauses Patient triggered events correlated with PVCs and NSVT.   TTE 08/14/20:  1. Left ventricular ejection fraction, by estimation, is 20 to 25%. The left ventricle has severely decreased function. The left ventricle has no regional wall motion abnormalities. Left ventricular diastolic parameters  are indeterminate.   2. Right ventricular systolic function was not well visualized. The right ventricular size  is not well visualized.   3. Left atrial size was moderately dilated.   4. The mitral valve is grossly normal. Mild mitral valve regurgitation. There is a second jet of mitral regurgitation seen in the four chamber view with eccentric pattern, mitral regurgitation may be underestimated in  this study.   5. The aortic valve is tricuspid. Aortic valve regurgitation is not visualized. No aortic stenosis is present.   6. The inferior vena cava is dilated in size with <50% respiratory variability, suggesting right atrial pressure of 15 mmHg.    TTE 08/04/20 IMPRESSIONS   1. Left ventricular ejection fraction, by estimation, is 20 to 25%. The left ventricle has severely decreased function. The left ventricle demonstrates global hypokinesis. The left ventricular internal cavity size  was mildly to moderately dilated. Left  ventricular diastolic parameters are consistent with Grade II diastolic dysfunction (pseudonormalization). Elevated left atrial pressure.   2. Right ventricular systolic function is normal. The right ventricular size is normal. There is mildly elevated pulmonary artery systolic pressure. The estimated right ventricular systolic pressure is 96.7 mmHg.   3. Left atrial size was moderately dilated.   4. The mitral valve is degenerative. Mild mitral valve regurgitation. No evidence of mitral stenosis.   5. The aortic valve is tricuspid. There is mild calcification of the aortic valve. There is mild thickening of the aortic valve. Aortic valve regurgitation is not  visualized. Mild aortic valve sclerosis is present,  with no evidence of aortic valve stenosis.   6. The inferior vena cava is dilated in size with <50% respiratory variability, suggesting right atrial pressure of 15 mmHg.   FINDINGS   Left Ventricle: Left ventricular ejection fraction, by estimation, is 20  to 25%. The left ventricle has severely decreased function. The left  ventricle demonstrates global hypokinesis. The left ventricular internal  cavity size was mildly to moderately  dilated. There is no left ventricular hypertrophy. Left ventricular  diastolic parameters are consistent with Grade II diastolic dysfunction  (pseudonormalization). Elevated left atrial pressure.   Right Ventricle: The right ventricular size is normal. No increase in  right ventricular wall thickness. Right ventricular systolic function is  normal. There is mildly elevated pulmonary artery systolic pressure. The  tricuspid regurgitant velocity is 2.39   m/s, and with an assumed right atrial pressure of 15 mmHg, the estimated  right ventricular systolic pressure is 89.3 mmHg.   Left Atrium: Left atrial size was moderately dilated.   Right Atrium: Right atrial size was normal in size.   Pericardium: Trivial pericardial effusion is present.   Mitral Valve: The mitral valve is degenerative in appearance. Mild mitral  annular calcification. Mild mitral valve regurgitation. No evidence of  mitral valve stenosis.   Tricuspid Valve: The tricuspid valve is grossly normal. Tricuspid valve  regurgitation is mild . No evidence of tricuspid stenosis.   Aortic Valve: The aortic valve is tricuspid. There is mild calcification  of the aortic valve. There is mild thickening of the aortic valve. Aortic  valve regurgitation is not visualized. Mild aortic valve sclerosis is  present, with no evidence of aortic  valve stenosis.   Pulmonic Valve: The pulmonic valve was grossly normal. Pulmonic valve  regurgitation  is trivial. No evidence of pulmonic stenosis.   Aorta: The aortic root and ascending aorta are structurally normal, with  no evidence of dilitation.   Venous: The inferior vena cava is dilated in size with less than 50%  respiratory variability, suggesting right atrial pressure of  15 mmHg.   IAS/Shunts: The atrial septum is grossly normal.     RHC/LHC 09/07/20: Conclusions: Mild plaquing of the mid LAD with 10-20% stenosis.  Otherwise, no angiographically significant coronary artery disease. Normal left and right heart filling pressures. Borderline elevated pulmonary artery pressure. Normal Fick cardiac output/index.   Recommendations: Maintain net even fluid balance and optimize goal directed medical therapy for non-ischemic cardiomyopathy. Primary    EKG:  EKG is personally reviewed. 06/09/2022:  Sinus rhythm. PVC. Rate 82 bpm. 08/06/21: NSR with HR 69, 1 degree AVB 11/11/2020: EKG is not ordered today.  11/05/2021: not ordered today    Recent Labs: 09/28/2021: ALT 16; Hemoglobin 12.4; Platelets 293.0; TSH 1.38 12/29/2021: BUN 26; Creatinine, Ser 1.27; Potassium 4.2; Sodium 139   Recent Lipid Panel    Component Value Date/Time   CHOL 111 09/28/2021 1019   TRIG 91.0 09/28/2021 1019   HDL 55.70 09/28/2021 1019   CHOLHDL 2 09/28/2021 1019   VLDL 18.2 09/28/2021 1019   LDLCALC 37 09/28/2021 1019      Physical Exam:    VS:  BP 138/78   Pulse 79   Ht '5\' 4"'$  (1.626 m)   Wt 197 lb (89.4 kg)   SpO2 97%   BMI 33.81 kg/m     Wt Readings from Last 3 Encounters:  06/09/22 197 lb (89.4 kg)  05/06/22 195 lb (88.5 kg)  04/05/22 195 lb 6.4 oz (88.6 kg)     GEN: Well nourished, well developed in no acute distress HEENT: Normal NECK: No JVD; No carotid bruits CARDIAC: RRR, 1/6 systolic murmur, no rubs, no gallops RESPIRATORY:  Clear to auscultation without rales, wheezing or rhonchi  ABDOMEN: Soft, non-tender, non-distended MUSCULOSKELETAL:  No edema; warm SKIN: Warm and  dry NEUROLOGIC:  Alert and oriented x 3 PSYCHIATRIC:  Normal affect   ASSESSMENT:    1. Congestive heart failure with left ventricular systolic dysfunction (HCC)   2. HFrEF (heart failure with reduced ejection fraction) ([place])   3. Nonischemic cardiomyopathy ([place])   4. Hypertension associated with diabetes ([place])   5. Coronary artery disease involving native coronary artery of native heart without angina pectoris   6. Stage 3a chronic kidney disease ([place])   7. PVC (premature ventricular contraction)   8. Controlled type 2 diabetes mellitus without complication, without long-term current use of insulin ([place])   9. Preoperative cardiovascular examination     PLAN:    In order of problems listed above:  #Pre-operative Evaluation: Patient with no known CAD with cath 09/2020 with no obstructive disease. EF has improved to 45%. She is euvolemic and compensated. No further CV work-up prior to surgery. Revised cardiac risk index is 1 with 6% 30 day risk of major CV complication.   #Newly diagnosed HFrEF:  #Non-ischemic cardiomypathy: Diagnosed in the setting of COVID-19 pneumonia. Cath with mild, non-obstructive disease. Initial TTE with LVEF 20-25%-->30-35%-->45%. Currently,  patient is doing well with NYHA class II symptoms. Euvolemic on exam. -Continue lasix '20mg'$  4x/wk (M, W, F, Sat) -Continue coreg  3.125 mg BID -Continue entresto to 49/'51mg'$  BID -Continue farxiga '10mg'$  daily -Continue spironolactone 12.'5mg'$  daily  -Cath without obstructive disease -EF improved, no need for ICD -Continue daily weights and call if gaining >2-3lbs in 2 days or 5lbs in 1 week -Low Na diet -Will look into patient assistance for entresto and farxiga   #Non-obstructive CAD: Cath on 09/07/20 with mild plaquing of the mid LAD with 10-20% stenosis.  Otherwise, no angiographically significant coronary artery disease. -  Continue crestor '20mg'$  daily   #PVCs/NSVT: Cardiac monitor with several runs of NSVT (longest  11 beats) and frequent (10%) burden of PVCs. No sustained arrhythmias. -Continue coreg  3.125 mg BID -Manage HF as above   #CKD: Cr back to baseline. -Lasix as above   #DMII: -Continue farxiga '10mg'$  daily  Follow-up in 6 months.  Medication Adjustments/Labs and Tests Ordered: Current medicines are reviewed at length with the patient today.  Concerns regarding medicines are outlined above.   Orders Placed This Encounter  Procedures   EKG 12-Lead   Meds ordered this encounter  Medications   carvedilol (COREG) 3.125 MG tablet    Sig: Take 1 tablet (3.125 mg total) by mouth 2 (two) times daily.    Dispense:  180 tablet    Refill:  1    Dose decrease   Patient Instructions  Medication Instructions:   DECREASE YOUR CARVEDILOL TO 3.125 MG BY MOUTH TWICE DAILY  *If you need a refill on your cardiac medications before your next appointment, please call your pharmacy*    Follow-Up: At [facility], you and your health needs are our priority.  As part of our continuing mission to provide you with exceptional heart care, we have created designated Provider Care Teams.  These Care Teams include your primary Cardiologist (physician) and Advanced Practice Providers (APPs -  Physician Assistants and Nurse Practitioners) who all work together to provide you with the care you need, when you need it.  We recommend signing up for the patient portal called "MyChart".  Sign up information is provided on this After Visit Summary.  MyChart is used to connect with patients for Virtual Visits (Telemedicine).  Patients are able to view lab/test results, encounter notes, upcoming appointments, etc.  Non-urgent messages can be sent to your provider as well.   To learn more about what you can do with MyChart, go to [email or web address].    Your next appointment:   6 month(s)  The format for your next appointment:   In Person  Provider:   Freada Bergeron, MD      Important  Information About Sugar         I,Mathew Stumpf,acting as a scribe for Freada Bergeron, MD.,have documented all relevant documentation on the behalf of Freada Bergeron, MD,as directed by  Freada Bergeron, MD while in the presence of Freada Bergeron, MD.  I, Freada Bergeron, MD, have reviewed all documentation for this visit. The documentation on 06/09/22 for the exam, diagnosis, procedures, and orders are all accurate and complete.   Signed, Freada Bergeron, MD  06/09/2022 10:56 AM    [place]

## 2022-06-28 ENCOUNTER — Telehealth: Payer: Self-pay

## 2022-06-28 NOTE — Telephone Encounter (Signed)
Forms signed and dated by Dr. Johney Frame.  All forms faxed to the contact information provided on cover sheet.    Confirmation fax received.   Will make PA Nurse aware of completion.

## 2022-06-28 NOTE — Telephone Encounter (Signed)
**Note De-Identified  Obfuscation** The pts completed [employer]PA[employer] application for Entresto assistance was left at the office wih documents.  I have completed the providers page of her application and have e-mailed all to Dr Jacolyn Reedy nurse so she can obtain her signature, date it, and to then fax all to [employer] at the fax number written on the cover letter included.

## 2022-07-07 NOTE — Telephone Encounter (Signed)
**Note De-Identified Kemper Heupel Obfuscation** Letter received from [employer] Honse fax stating that they have approved the pt for Entresto assistance until 08/01/2023. Pt ID: [id number]  The letter states tat they have notified the pt of this approval as well.

## 2022-07-09 ENCOUNTER — Other Ambulatory Visit: Payer: Self-pay | Admitting: Family Medicine

## 2022-07-11 ENCOUNTER — Other Ambulatory Visit: Payer: Self-pay

## 2022-07-11 MED ORDER — ROSUVASTATIN CALCIUM 20 MG PO TABS: 20.0000 mg | ORAL_TABLET | Freq: Every day | ORAL | 3 refills | Status: DC

## 2022-07-15 NOTE — Telephone Encounter (Signed)
[employer] SHIPPED A MEDICATION TO PATIENT , ID # PEP-ID [id number], PHONE NUM [phone]

## 2022-08-03 ENCOUNTER — Other Ambulatory Visit: Payer: Self-pay | Admitting: Otolaryngology

## 2022-08-04 LAB — HM DIABETES EYE EXAM

## 2022-08-08 ENCOUNTER — Encounter (HOSPITAL_BASED_OUTPATIENT_CLINIC_OR_DEPARTMENT_OTHER): Payer: Self-pay | Admitting: Otolaryngology

## 2022-08-08 ENCOUNTER — Other Ambulatory Visit: Payer: Self-pay

## 2022-08-09 ENCOUNTER — Encounter (HOSPITAL_BASED_OUTPATIENT_CLINIC_OR_DEPARTMENT_OTHER)
Admission: RE | Admit: 2022-08-09 | Discharge: 2022-08-09 | Disposition: A | Discharge status: Home / Self Care | Payer: Medicare Other | Source: Ambulatory Visit | Attending: Otolaryngology | Admitting: Otolaryngology

## 2022-08-09 DIAGNOSIS — Z01812 Encounter for preprocedural laboratory examination: Secondary | ICD-10-CM | POA: Insufficient documentation

## 2022-08-09 DIAGNOSIS — J324 Chronic pansinusitis: Secondary | ICD-10-CM | POA: Diagnosis not present

## 2022-08-09 DIAGNOSIS — J3489 Other specified disorders of nose and nasal sinuses: Secondary | ICD-10-CM | POA: Diagnosis not present

## 2022-08-09 DIAGNOSIS — E1142 Type 2 diabetes mellitus with diabetic polyneuropathy: Secondary | ICD-10-CM | POA: Diagnosis not present

## 2022-08-09 DIAGNOSIS — J343 Hypertrophy of nasal turbinates: Secondary | ICD-10-CM | POA: Diagnosis not present

## 2022-08-09 DIAGNOSIS — I110 Hypertensive heart disease with heart failure: Secondary | ICD-10-CM | POA: Diagnosis not present

## 2022-08-09 DIAGNOSIS — E119 Type 2 diabetes mellitus without complications: Secondary | ICD-10-CM | POA: Diagnosis not present

## 2022-08-09 DIAGNOSIS — J338 Other polyp of sinus: Secondary | ICD-10-CM | POA: Diagnosis not present

## 2022-08-09 DIAGNOSIS — I509 Heart failure, unspecified: Secondary | ICD-10-CM | POA: Diagnosis not present

## 2022-08-09 LAB — BASIC METABOLIC PANEL
Anion gap: 11 (ref 5–15)
BUN: 23 mg/dL (ref 8–23)
CO2: 23 mmol/L (ref 22–32)
Calcium: 9.2 mg/dL (ref 8.9–10.3)
Chloride: 104 mmol/L (ref 98–111)
Creatinine, Ser: 1.23 mg/dL — ABNORMAL HIGH (ref 0.44–1.00)
GFR, Estimated: 46 mL/min — ABNORMAL LOW (ref >60)
Glucose, Bld: 60 mg/dL — ABNORMAL LOW (ref 70–99)
Potassium: 4.6 mmol/L (ref 3.5–5.1)
Sodium: 138 mmol/L (ref 135–145)

## 2022-08-11 NOTE — Anesthesia Preprocedure Evaluation (Addendum)
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 Left ventricular ejection fraction, by estimation, is 45 to 50%. The  left ventricle has mildly decreased function. The left ventricle  demonstrates global hypokinesis. Left ventricular diastolic parameters are  consistent with Grade I diastolic  dysfunction (impaired relaxation).   2. Right ventricular systolic function is normal. The right ventricular  size is normal. Tricuspid regurgitation signal is inadequate for assessing  PA pressure.   3. The mitral valve is grossly normal. Trivial mitral valve  regurgitation. No evidence of mitral stenosis.   4. The aortic valve is tricuspid. Aortic valve regurgitation is not  visualized. No aortic stenosis is present.   5. The inferior vena cava is normal in size with greater than 50%  respiratory variability, suggesting right atrial pressure of 3 mmHg.   Cath 2022 Conclusions: 1. Mild plaquing of the mid LAD with 10-20% stenosis.  Otherwise, no angiographically significant coronary artery disease. 2. Normal left and right heart filling pressures. 3. Borderline elevated pulmonary artery pressure. 4. Normal Fick cardiac  output/index.      Neuro/Psych negative neurological ROS  negative psych ROS   GI/Hepatic Neg liver ROS,GERD  Controlled,,  Endo/Other  diabetes    Renal/GU negative Renal ROS  negative genitourinary   Musculoskeletal  (+) Arthritis , Osteoarthritis,  Chronic back pain, scoliosis    Abdominal   Peds  Hematology negative hematology ROS (+)   Anesthesia Other Findings   Reproductive/Obstetrics negative OB ROS                              Anesthesia Physical Anesthesia Plan  ASA: 3  Anesthesia Plan: General   Post-op Pain Management: Tylenol PO (pre-op)*   Induction: Intravenous  PONV Risk Score and Plan: 4 or greater and Ondansetron, Dexamethasone and Treatment may vary due to age or medical condition  Airway Management Planned: Oral ETT and Video Laryngoscope Planned  Additional Equipment: None  Intra-op Plan:   Post-operative Plan: Extubation in OR  Informed Consent: I have reviewed the patients History and Physical, chart, labs and discussed the procedure including the risks, benefits and alternatives for the proposed anesthesia with the patient or authorized representative who has indicated his/her understanding and acceptance.     Dental advisory given  Plan Discussed with: CRNA  Anesthesia Plan Comments: (Last airway: Laryngoscope Size: Glidescope and 3 Grade View: Grade I Tube type: Oral Tube size: 7.0 mm Number of attempts: 1 )         Anesthesia Quick Evaluation

## 2022-08-15 ENCOUNTER — Encounter (HOSPITAL_BASED_OUTPATIENT_CLINIC_OR_DEPARTMENT_OTHER): Payer: Self-pay | Admitting: Otolaryngology

## 2022-08-15 ENCOUNTER — Other Ambulatory Visit: Payer: Self-pay

## 2022-08-15 ENCOUNTER — Ambulatory Visit (HOSPITAL_BASED_OUTPATIENT_CLINIC_OR_DEPARTMENT_OTHER)
Admission: RE | Admit: 2022-08-15 | Discharge: 2022-08-15 | Disposition: A | Discharge status: Home / Self Care | Payer: Medicare Other | Attending: Otolaryngology | Admitting: Otolaryngology

## 2022-08-15 ENCOUNTER — Ambulatory Visit (HOSPITAL_BASED_OUTPATIENT_CLINIC_OR_DEPARTMENT_OTHER): Payer: Medicare Other | Admitting: Anesthesiology

## 2022-08-15 ENCOUNTER — Encounter (HOSPITAL_BASED_OUTPATIENT_CLINIC_OR_DEPARTMENT_OTHER)
Admission: RE | Disposition: A | Discharge status: Home / Self Care | Payer: Self-pay | Source: Home / Self Care | Attending: Otolaryngology

## 2022-08-15 DIAGNOSIS — I5030 Unspecified diastolic (congestive) heart failure: Secondary | ICD-10-CM

## 2022-08-15 DIAGNOSIS — J3489 Other specified disorders of nose and nasal sinuses: Secondary | ICD-10-CM | POA: Insufficient documentation

## 2022-08-15 DIAGNOSIS — E1142 Type 2 diabetes mellitus with diabetic polyneuropathy: Secondary | ICD-10-CM

## 2022-08-15 DIAGNOSIS — J324 Chronic pansinusitis: Secondary | ICD-10-CM | POA: Diagnosis not present

## 2022-08-15 DIAGNOSIS — I110 Hypertensive heart disease with heart failure: Secondary | ICD-10-CM | POA: Insufficient documentation

## 2022-08-15 DIAGNOSIS — E119 Type 2 diabetes mellitus without complications: Secondary | ICD-10-CM

## 2022-08-15 DIAGNOSIS — I509 Heart failure, unspecified: Secondary | ICD-10-CM | POA: Insufficient documentation

## 2022-08-15 DIAGNOSIS — J338 Other polyp of sinus: Secondary | ICD-10-CM | POA: Insufficient documentation

## 2022-08-15 DIAGNOSIS — J343 Hypertrophy of nasal turbinates: Secondary | ICD-10-CM

## 2022-08-15 HISTORY — PX: FRONTAL SINUS EXPLORATION: SHX6591

## 2022-08-15 HISTORY — PX: TURBINATE REDUCTION: SHX6157

## 2022-08-15 HISTORY — PX: SINUS ENDO W/FUSION: SHX777

## 2022-08-15 HISTORY — PX: MAXILLARY ANTROSTOMY: SHX2003

## 2022-08-15 HISTORY — PX: ETHMOIDECTOMY: SHX5197

## 2022-08-15 LAB — GLUCOSE, CAPILLARY
Glucose-Capillary: 120 mg/dL — ABNORMAL HIGH (ref 70–99)
Glucose-Capillary: 121 mg/dL — ABNORMAL HIGH (ref 70–99)

## 2022-08-15 SURGERY — REDUCTION, NASAL TURBINATE
Anesthesia: General | Site: Nose | Laterality: Bilateral

## 2022-08-15 MED ORDER — 0.9 % SODIUM CHLORIDE (POUR BTL) OPTIME
TOPICAL | Status: DC | PRN
  Administered 2022-08-15: 350 mL

## 2022-08-15 MED ORDER — PHENYLEPHRINE HCL (PRESSORS) 10 MG/ML IV SOLN
INTRAVENOUS | Status: AC
  Filled 2022-08-15: qty 1

## 2022-08-15 MED ORDER — ROCURONIUM BROMIDE 100 MG/10ML IV SOLN
INTRAVENOUS | Status: DC | PRN
  Administered 2022-08-15: 70 mg via INTRAVENOUS

## 2022-08-15 MED ORDER — LIDOCAINE-EPINEPHRINE 1 %-1:100000 IJ SOLN
INTRAMUSCULAR | Status: AC
  Filled 2022-08-15: qty 1

## 2022-08-15 MED ORDER — AMOXICILLIN 875 MG PO TABS: 875.0000 mg | ORAL_TABLET | Freq: Two times a day (BID) | ORAL | 0 refills | Status: AC

## 2022-08-15 MED ORDER — MIDAZOLAM HCL 5 MG/5ML IJ SOLN: INTRAMUSCULAR | Status: DC | PRN

## 2022-08-15 MED ORDER — MUPIROCIN 2 % EX OINT
TOPICAL_OINTMENT | CUTANEOUS | Status: DC | PRN
  Administered 2022-08-15: 1 via NASAL

## 2022-08-15 MED ORDER — ACETAMINOPHEN 500 MG PO TABS
1000.0000 mg | ORAL_TABLET | Freq: Once | ORAL | Status: AC
  Administered 2022-08-15: 1000 mg via ORAL

## 2022-08-15 MED ORDER — OXYMETAZOLINE HCL 0.05 % NA SOLN
NASAL | Status: AC
  Filled 2022-08-15: qty 30

## 2022-08-15 MED ORDER — OXYCODONE HCL 5 MG PO TABS
ORAL_TABLET | ORAL | Status: AC
  Filled 2022-08-15: qty 1

## 2022-08-15 MED ORDER — LIDOCAINE HCL (CARDIAC) PF 100 MG/5ML IV SOSY
PREFILLED_SYRINGE | INTRAVENOUS | Status: DC | PRN
  Administered 2022-08-15: 60 mg via INTRAVENOUS

## 2022-08-15 MED ORDER — HYDROMORPHONE HCL 1 MG/ML IJ SOLN
0.2500 mg | INTRAMUSCULAR | Status: DC | PRN
  Administered 2022-08-15: 0.5 mg via INTRAVENOUS

## 2022-08-15 MED ORDER — ROCURONIUM BROMIDE 10 MG/ML (PF) SYRINGE
PREFILLED_SYRINGE | INTRAVENOUS | Status: AC
  Filled 2022-08-15: qty 20

## 2022-08-15 MED ORDER — OXYMETAZOLINE HCL 0.05 % NA SOLN
NASAL | Status: DC | PRN
  Administered 2022-08-15: 1 via TOPICAL

## 2022-08-15 MED ORDER — PHENYLEPHRINE HCL-NACL 20-0.9 MG/250ML-% IV SOLN
INTRAVENOUS | Status: DC | PRN
  Administered 2022-08-15: 40 ug/min via INTRAVENOUS
  Administered 2022-08-15: 160 ug via INTRAVENOUS

## 2022-08-15 MED ORDER — LACTATED RINGERS IV SOLN: INTRAVENOUS | Status: DC

## 2022-08-15 MED ORDER — OXYCODONE HCL 5 MG PO TABS
5.0000 mg | ORAL_TABLET | Freq: Once | ORAL | Status: AC | PRN
  Administered 2022-08-15: 5 mg via ORAL

## 2022-08-15 MED ORDER — ONDANSETRON HCL 4 MG/2ML IJ SOLN
INTRAMUSCULAR | Status: DC | PRN
  Administered 2022-08-15: 4 mg via INTRAVENOUS

## 2022-08-15 MED ORDER — AMISULPRIDE (ANTIEMETIC) 5 MG/2ML IV SOLN: 10.0000 mg | Freq: Once | INTRAVENOUS | Status: DC | PRN

## 2022-08-15 MED ORDER — PROPOFOL 10 MG/ML IV BOLUS
INTRAVENOUS | Status: DC | PRN
  Administered 2022-08-15: 130 mg via INTRAVENOUS

## 2022-08-15 MED ORDER — FENTANYL CITRATE (PF) 100 MCG/2ML IJ SOLN
INTRAMUSCULAR | Status: AC
  Filled 2022-08-15: qty 2

## 2022-08-15 MED ORDER — CEFAZOLIN SODIUM-DEXTROSE 2-3 GM-%(50ML) IV SOLR
INTRAVENOUS | Status: DC | PRN
  Administered 2022-08-15: 2 g via INTRAVENOUS

## 2022-08-15 MED ORDER — ATROPINE SULFATE 0.4 MG/ML IV SOLN
INTRAVENOUS | Status: AC
  Filled 2022-08-15: qty 1

## 2022-08-15 MED ORDER — FENTANYL CITRATE (PF) 100 MCG/2ML IJ SOLN
INTRAMUSCULAR | Status: DC | PRN
  Administered 2022-08-15: 50 ug via INTRAVENOUS
  Administered 2022-08-15: 25 ug via INTRAVENOUS

## 2022-08-15 MED ORDER — SODIUM CHLORIDE 0.9 % IV SOLN
INTRAVENOUS | Status: AC | PRN
  Administered 2022-08-15: 100 mL

## 2022-08-15 MED ORDER — MUPIROCIN 2 % EX OINT
TOPICAL_OINTMENT | CUTANEOUS | Status: AC
  Filled 2022-08-15: qty 22

## 2022-08-15 MED ORDER — HYDROMORPHONE HCL 1 MG/ML IJ SOLN
INTRAMUSCULAR | Status: AC
  Filled 2022-08-15: qty 0.5

## 2022-08-15 MED ORDER — ACETAMINOPHEN 500 MG PO TABS
ORAL_TABLET | ORAL | Status: AC
  Filled 2022-08-15: qty 2

## 2022-08-15 MED ORDER — DEXAMETHASONE SODIUM PHOSPHATE 4 MG/ML IJ SOLN
INTRAMUSCULAR | Status: DC | PRN
  Administered 2022-08-15: 4 mg via INTRAVENOUS

## 2022-08-15 MED ORDER — SUGAMMADEX SODIUM 200 MG/2ML IV SOLN
INTRAVENOUS | Status: DC | PRN
  Administered 2022-08-15: 200 mg via INTRAVENOUS

## 2022-08-15 MED ORDER — CEFAZOLIN SODIUM-DEXTROSE 2-4 GM/100ML-% IV SOLN
INTRAVENOUS | Status: AC
  Filled 2022-08-15: qty 100

## 2022-08-15 MED ORDER — OXYCODONE HCL 5 MG/5ML PO SOLN: 5.0000 mg | Freq: Once | ORAL | Status: AC | PRN

## 2022-08-15 MED ORDER — ONDANSETRON HCL 4 MG/2ML IJ SOLN: 4.0000 mg | Freq: Once | INTRAMUSCULAR | Status: DC | PRN

## 2022-08-15 SURGICAL SUPPLY — 51 items
ATTRACTOMAT 16X20 MAGNETIC DRP (DRAPES) IMPLANT
BLADE RAD40 ROTATE 4M 4 5PK (BLADE) IMPLANT
BLADE RAD60 ROTATE M4 4 5PK (BLADE) IMPLANT
BLADE ROTATE RAD 12 4 M4 (BLADE) IMPLANT
BLADE ROTATE RAD 40 4 M4 (BLADE) IMPLANT
BLADE ROTATE TRICUT 4X13 M4 (BLADE) ×1 IMPLANT
BLADE TRICUT ROTATE M4 4 5PK (BLADE) IMPLANT
BUR HS RAD FRONTAL 3 (BURR) IMPLANT
CANISTER SUC SOCK COL 7IN (MISCELLANEOUS) ×1 IMPLANT
CANISTER SUCT 1200ML W/VALVE (MISCELLANEOUS) ×1 IMPLANT
COAGULATOR SUCT 8FR VV (MISCELLANEOUS) IMPLANT
DEFOGGER MIRROR 1QT (MISCELLANEOUS) ×1 IMPLANT
DRSG NASOPORE 8CM (GAUZE/BANDAGES/DRESSINGS) IMPLANT
DRSG TELFA 3X8 NADH STRL (GAUZE/BANDAGES/DRESSINGS) IMPLANT
ELECT REM PT RETURN 9FT ADLT (ELECTROSURGICAL) ×1
ELECTRODE REM PT RTRN 9FT ADLT (ELECTROSURGICAL) ×1 IMPLANT
GLOVE BIO SURGEON STRL SZ7.5 (GLOVE) ×1 IMPLANT
GLOVE BIOGEL PI IND STRL 6.5 (GLOVE) IMPLANT
GLOVE BIOGEL PI IND STRL 7.0 (GLOVE) IMPLANT
GLOVE SURG SS PI 6.5 STRL IVOR (GLOVE) IMPLANT
GLOVE SURG SS PI 7.0 STRL IVOR (GLOVE) IMPLANT
GOWN STRL REUS W/ TWL LRG LVL3 (GOWN DISPOSABLE) ×2 IMPLANT
GOWN STRL REUS W/TWL LRG LVL3 (GOWN DISPOSABLE) ×3
HEMOSTAT SURGICEL 2X14 (HEMOSTASIS) IMPLANT
IV NS 500ML (IV SOLUTION) ×1
IV NS 500ML BAXH (IV SOLUTION) ×1 IMPLANT
NDL HYPO 25X1 1.5 SAFETY (NEEDLE) IMPLANT
NEEDLE HYPO 25X1 1.5 SAFETY (NEEDLE) IMPLANT
NS IRRIG 1000ML POUR BTL (IV SOLUTION) ×1 IMPLANT
PACK BASIN DAY SURGERY FS (CUSTOM PROCEDURE TRAY) ×1 IMPLANT
PACK ENT DAY SURGERY (CUSTOM PROCEDURE TRAY) ×1 IMPLANT
PATTIES SURGICAL .5 X3 (DISPOSABLE) IMPLANT
SLEEVE SCD COMPRESS KNEE MED (STOCKING) ×1 IMPLANT
SPIKE FLUID TRANSFER (MISCELLANEOUS) IMPLANT
SPLINT NASAL AIRWAY SILICONE (MISCELLANEOUS) IMPLANT
SPONGE GAUZE 2X2 8PLY STRL LF (GAUZE/BANDAGES/DRESSINGS) ×1 IMPLANT
SPONGE NEURO XRAY DETECT 1X3 (DISPOSABLE) ×1 IMPLANT
SUCTION FRAZIER HANDLE 10FR (MISCELLANEOUS)
SUCTION TUBE FRAZIER 10FR DISP (MISCELLANEOUS) IMPLANT
SUT CHROMIC 4 0 P 3 18 (SUTURE) IMPLANT
SUT PLAIN 4 0 ~~LOC~~ 1 (SUTURE) IMPLANT
SUT PROLENE 3 0 PS 2 (SUTURE) IMPLANT
SYR 3ML 23GX1 SAFETY (SYRINGE) IMPLANT
SYR 50ML LL SCALE MARK (SYRINGE) IMPLANT
TOWEL GREEN STERILE FF (TOWEL DISPOSABLE) ×1 IMPLANT
TRACKER ENT INSTRUMENT (MISCELLANEOUS) ×1 IMPLANT
TRACKER ENT PATIENT (MISCELLANEOUS) ×1 IMPLANT
TUBE CONNECTING 20X1/4 (TUBING) ×1 IMPLANT
TUBE SALEM SUMP 16F (TUBING) IMPLANT
TUBING STRAIGHTSHOT EPS 5PK (TUBING) ×1 IMPLANT
YANKAUER SUCT BULB TIP NO VENT (SUCTIONS) ×1 IMPLANT

## 2022-08-15 NOTE — Discharge Instructions (Addendum)
POSTOPERATIVE INSTRUCTIONS FOR PATIENTS HAVING NASAL OR SINUS OPERATIONS ACTIVITY: Restrict activity at home for the first two days, resting as much as possible. Light activity is best. You may usually return to work within a week. You should refrain from nose blowing, strenuous activity, or heavy lifting greater than 20lbs for a total of one week after your operation.  If sneezing cannot be avoided, sneeze with your mouth open. DISCOMFORT: You may experience a dull headache and pressure along with nasal congestion and discharge. These symptoms may be worse during the first week after the operation but may last as long as two to four weeks.  Please take Tylenol or the pain medication that has been prescribed for you. Do not take aspirin or aspirin containing medications since they may cause bleeding.  You may experience symptoms of post nasal drainage, nasal congestion, headaches and fatigue for two or three months after your operation.  BLEEDING: You may have some blood tinged nasal drainage for approximately two weeks after the operation.  The discharge will be worse for the first week.  Please call our office at [phone] or go to the nearest hospital emergency room if you experience any of the following: heavy, bright red blood from your nose or mouth that lasts longer than 15 minutes or coughing up or vomiting bright red blood or blood clots. GENERAL CONSIDERATIONS: A gauze dressing will be placed on your upper lip to absorb any drainage after the operation. You may need to change this several times a day.  If you do not have very much drainage, you may remove the dressing.  Remember that you may gently wipe your nose with a tissue and sniff in, but DO NOT blow your nose. Please keep all of your postoperative appointments.  Your final results after the operation will depend on proper follow-up.  The initial visit is usually 2 to 5 days after the operation.  During this visit, the remaining nasal  packing and internal septal splints will be removed.  Your nasal and sinus cavities will be cleaned.  During the second visit, your nasal and sinus cavities will be cleaned again. Have someone drive you to your first two postoperative appointments.  How you care for your nose after the operation will influence the results that you obtain.  You should follow all directions, take your medication as prescribed, and call our office [phone] with any problems or questions. You may be more comfortable sleeping with your head elevated on two pillows. Do not take any medications that we have not prescribed or recommended. WARNING SIGNS: if any of the following should occur, please call our office: Persistent fever greater than 102F. Persistent vomiting. Severe and constant pain that is not relieved by prescribed pain medication. Trauma to the nose. Rash or unusual side effects from any medicines.    Post Anesthesia Home Care Instructions  Activity: Get plenty of rest for the remainder of the day. A responsible individual must stay with you for 24 hours following the procedure.  For the next 24 hours, DO NOT: -Drive a car -Paediatric nurse -Drink alcoholic beverages -Take any medication unless instructed by your physician -Make any legal decisions or sign important papers.  Meals: Start with liquid foods such as gelatin or soup. Progress to regular foods as tolerated. Avoid greasy, spicy, heavy foods. If nausea and/or vomiting occur, drink only clear liquids until the nausea and/or vomiting subsides. Call your physician if vomiting continues.  Special Instructions/Symptoms: Your throat may feel dry  or sore from the anesthesia or the breathing tube placed in your throat during surgery. If this causes discomfort, gargle with warm salt water. The discomfort should disappear within 24 hours.  If you had a scopolamine patch placed behind your ear for the management of post- operative nausea  and/or vomiting:  1. The medication in the patch is effective for 72 hours, after which it should be removed.  Wrap patch in a tissue and discard in the trash. Wash hands thoroughly with soap and water. 2. You may remove the patch earlier than 72 hours if you experience unpleasant side effects which may include dry mouth, dizziness or visual disturbances. 3. Avoid touching the patch. Wash your hands with soap and water after contact with the patch.     May have Tylenol today after 1:25 PM

## 2022-08-15 NOTE — Transfer of Care (Signed)
Immediate Anesthesia Transfer of Care Note  Patient: [name]  Procedure(s) Performed: Albin Felling REDUCTION (Bilateral: Nose) ENDOSCOPIC MAXILLARY ANTROSTOMY WITH TISSUE REMOVAL (Bilateral: Nose) FRONTAL RECESS SINUS EXPLORATION WITH NAVIGATION (Bilateral: Nose) ETHMOIDECTOMY WITH TISSUE REMOVAL (Bilateral: Nose) SPHENOIDECTOMY WITH TISSUE REMOVAL WITH NAVIGATION (Bilateral: Nose)  Patient Location: PACU  Anesthesia Type:General  Level of Consciousness: awake, alert , oriented, and patient cooperative  Airway & Oxygen Therapy: Patient Spontanous Breathing and Patient connected to face mask oxygen  Post-op Assessment: Report given to RN and Post -op Vital signs reviewed and stable  Post vital signs: Reviewed and stable  Last Vitals:  Vitals Value Taken Time  BP 153/78 08/15/22 1013  Temp    Pulse 90 08/15/22 1014  Resp    SpO2 97 % 08/15/22 1014  Vitals shown include unvalidated device data.  Last Pain:  Vitals:   08/15/22 0717  TempSrc: Oral  PainSc: 0-No pain      Patients Stated Pain Goal: 4 (54/65/03 5465)  Complications: No notable events documented.

## 2022-08-15 NOTE — Op Note (Signed)
DATE OF PROCEDURE: 08/15/2022  OPERATIVE REPORT   SURGEON: Leta Baptist, MD   PREOPERATIVE DIAGNOSES:  Bilateral chronic pansinusitis and polyposis. 2.  Bilateral inferior turbinate hypertrophy.  3.  Chronic nasal obstruction.  POSTOPERATIVE DIAGNOSES:  1. Bilateral chronic pansinusitis and polyposis. 2. Bilateral inferior turbinate hypertrophy.  3. Chronic nasal obstruction.  PROCEDURE PERFORMED:  1.  Bilateral endoscopic total ethmoidectomy and sphenoidectomy with polyp removal. 2.  Bilateral partial inferior turbinate resection.  3.  Bilateral endoscopic frontal sinusotomy with polyp removal. 4.  Bilateral endoscopic maxillary antrostomy with polyp removal  5.  FUSION stereotactic image guidance.  ANESTHESIA: General endotracheal tube anesthesia.   COMPLICATIONS: None.   ESTIMATED BLOOD LOSS: 400 mL.   INDICATION FOR PROCEDURE: [name] is a 77 y.o. female with a history of chronic rhinosinusitis, with significant nasal mucosal congestion and bilateral inferior turbinate hypertrophy.  She was treated with Augmentin and Atrovent.  It should be noted that the patient was previously treated with multiple courses of antibiotics as well as OTC allergy medications.  She subsequently underwent a sinus CT scan.  The CT showed bilateral chronic pansinusitis and bilateral severe inferior turbinate hypertrophy.Based on the above findings, the decision was made for the patient to undergo the above-stated procedures. The risks, benefits, alternatives, and details of the procedures were discussed with the patient. Questions were invited and answered. Informed consent was obtained.   DESCRIPTION OF PROCEDURE: The patient was taken to the operating room and placed supine on the operating table. General endotracheal tube anesthesia was administered by the anesthesiologist. The patient was positioned, and prepped and draped in the standard fashion for nasal surgery. Pledgets soaked with Afrin were  placed in both nasal cavities for decongestion. The pledgets were subsequently removed. The FUSION stereotactic image guidance marker was placed. The image guidance system was functional throughout the case.  The inferior one half of both hypertrophied inferior turbinate was crossclamped with a Kelly clamp. The inferior one half of each inferior turbinate was then resected with a pair of cross cutting scissors. Hemostasis was achieved with a suction cautery device.   Using a 0 endoscope, the left nasal cavity was examined. A large middle turbinate was noted. Using Tru-Cut forceps, the inferior one third of the middle turbinate was resected. Polypoid tissue and purulent drainage were noted within the middle meatus. The polypoid tissue was removed using a combination of microdebrider and Blakesley forceps. The uncinate process was resected with a freer elevator. The maxillary antrum was entered and enlarged using a combination of backbiter and microdebrider. Polypoid tissue and fungus ball were removed from the left maxillary sinus.  Attention was then focused on the ethmoid sinuses. The bony partitions of the anterior and posterior ethmoid cavities were taken down. Polypoid tissue was noted and removed.  More polyps were noted to obstruct the left sphenoid opening. The polyps were removed. The sphenoid opening was entered and enlarged.  Polypoid tissue was removed from within the sphenoid sinus. Attention was then focused on the frontal sinus. The frontal recess was identified and enlarged by removing the surrounding bony partitions. Polypoid tissue was removed from the frontal recess. All 4 paranasal sinuses were copiously irrigated with saline solution.  The same procedure was repeated on the right side without exception. More polyps and fungal debris were noted on the right side. All polyps were removed. Doyle splints were applied to the nasal septum.  The care of the patient was turned over to the  anesthesiologist. The patient was  awakened from anesthesia without difficulty. The patient was extubated and transferred to the recovery room in good condition.   OPERATIVE FINDINGS: Bilateral chronic pansinusitis and polyposis, and bilateral inferior turbinate hypertrophy.   SPECIMEN: Bilateral sinus contents.  FOLLOWUP CARE: The patient be discharged home once she is awake and alert. The patient will follow up in my office in 3 days for splint removal.   Kristen Mittelman Raynelle Bring, MD

## 2022-08-15 NOTE — H&P (Signed)
Cc: Recurrent sinusitis, chronic nasal obstruction  HPI: The patient is a 77 year old female who returns today for her follow-up evaluation.  The patient was previously seen for frequent recurrent sinusitis and chronic nasal congestion.  At her last visit 1 month ago, she was noted to have chronic rhinosinusitis, with significant nasal mucosal congestion and bilateral inferior turbinate hypertrophy.  She was treated with Augmentin and Atrovent.  It should be noted that the patient was previously treated with multiple courses of antibiotics as well as OTC allergy medications.  She subsequently underwent a sinus CT scan.  The CT showed bilateral chronic pansinusitis and bilateral severe inferior turbinate hypertrophy.  The patient returns today complaining of persistent nasal congestion and facial pressure.  She is interested in more definitive treatment of her chronic condition.  Exam: General: Communicates without difficulty, well nourished, no acute distress. Head: Normocephalic, no evidence injury, no tenderness, facial buttresses intact without stepoff. Face/sinus: No tenderness to palpation and percussion. Facial movement is normal and symmetric. Eyes: PERRL, EOMI. No scleral icterus, conjunctivae clear. Neuro: CN II exam reveals vision grossly intact.  No nystagmus at any point of gaze. Ears: Auricles well formed without lesions.  Ear canals are intact without mass or lesion.  No erythema or edema is appreciated.  The TMs are intact without fluid. Nose: External evaluation reveals normal support and skin without lesions.  Dorsum is intact.  Anterior rhinoscopy reveals congested mucosa over anterior aspect of inferior turbinates and intact septum.  No purulence noted. Oral:  Oral cavity and oropharynx are intact, symmetric, without erythema or edema.  Mucosa is moist without lesions. Neck: Full range of motion without pain.  There is no significant lymphadenopathy.  No masses palpable.  Thyroid bed within  normal limits to palpation.  Parotid glands and submandibular glands equal bilaterally without mass.  Trachea is midline. Neuro:  CN 2-12 grossly intact. Gait normal. A flexible scope was inserted into the right nasal cavity.  Endoscopy of the interior nasal cavity, superior, inferior, and middle meatus was performed. The sphenoid-ethmoid recess was examined. Edematous mucosa was noted. Polypoid tissue was noted to obstruct the middle meatus.  Nasopharynx was clear.  Turbinates were hypertrophied but without mass. The procedure was repeated on the contralateral side with similar findings.  The patient tolerated the procedure well.  Assessment: 1.  Bilateral chronic pansinusitis, with polypoid tissue obstructing the sinus drainage pathways.  Her recent CT scan showed opacification of all her paranasal sinuses. 2.  Bilateral inferior turbinate hypertrophy, causing >95% nasal obstruction bilaterally.  Plan: 1.  The nasal endoscopy findings and the CT images are reviewed with the patient. 2.  Continue with her current allergy treatment regimen of Flonase and Atrovent nasal sprays. 3.  In light of her persistent symptoms, she may benefit from surgical intervention with bilateral endoscopic sinus surgery and bilateral inferior turbinate reduction.  The risk, benefits, and details of the procedures are discussed.  Questions are invited and answered. 4.  The patient would like to proceed with the procedures.

## 2022-08-15 NOTE — Anesthesia Procedure Notes (Signed)
Procedure Name: Intubation Date/Time: 08/15/2022 8:10 AM  Performed by: Malikiah Debarr, Ernesta Amble, CRNAPre-anesthesia Checklist: Patient identified, Emergency Drugs available, Suction available and Patient being monitored Patient Re-evaluated:Patient Re-evaluated prior to induction Oxygen Delivery Method: Circle system utilized Preoxygenation: Pre-oxygenation with 100% oxygen Induction Type: IV induction Ventilation: Mask ventilation without difficulty Laryngoscope Size: Glidescope Grade View: Grade I Tube type: Oral Tube size: 7.0 mm Number of attempts: 1 Airway Equipment and Method: Stylet and Oral airway Placement Confirmation: ETT inserted through vocal cords under direct vision, positive ETCO2 and breath sounds checked- equal and bilateral Tube secured with: Tape Dental Injury: Teeth and Oropharynx as per pre-operative assessment

## 2022-08-15 NOTE — Anesthesia Postprocedure Evaluation (Signed)
Anesthesia Post Note  Patient: MARSHAY SLATES  Procedure(s) Performed: Albin Felling REDUCTION (Bilateral: Nose) ENDOSCOPIC MAXILLARY ANTROSTOMY WITH TISSUE REMOVAL (Bilateral: Nose) FRONTAL RECESS SINUS EXPLORATION WITH NAVIGATION (Bilateral: Nose) ETHMOIDECTOMY WITH TISSUE REMOVAL (Bilateral: Nose) SPHENOIDECTOMY WITH TISSUE REMOVAL WITH NAVIGATION (Bilateral: Nose)     Patient location during evaluation: PACU Anesthesia Type: General Level of consciousness: awake and alert, oriented and patient cooperative Pain management: pain level controlled Vital Signs Assessment: post-procedure vital signs reviewed and stable Respiratory status: spontaneous breathing, nonlabored ventilation and respiratory function stable Cardiovascular status: blood pressure returned to baseline and stable Postop Assessment: no apparent nausea or vomiting Anesthetic complications: no   No notable events documented.  Last Vitals:  Vitals:   08/15/22 1045 08/15/22 1100  BP: (!) 146/71 (!) 140/69  Pulse: 82 76  Resp: (!) 25 11  Temp:    SpO2: 96% 94%    Last Pain:  Vitals:   08/15/22 1056  TempSrc:   PainSc: [place]

## 2022-08-16 ENCOUNTER — Encounter (HOSPITAL_BASED_OUTPATIENT_CLINIC_OR_DEPARTMENT_OTHER): Payer: Self-pay | Admitting: Otolaryngology

## 2022-08-16 LAB — SURGICAL PATHOLOGY

## 2022-09-26 ENCOUNTER — Other Ambulatory Visit: Payer: Self-pay

## 2022-09-26 MED ORDER — SPIRONOLACTONE 25 MG PO TABS: 12.5000 mg | ORAL_TABLET | Freq: Every day | ORAL | 2 refills | Status: DC

## 2022-10-04 ENCOUNTER — Encounter: Payer: Self-pay | Admitting: Family Medicine

## 2022-10-04 ENCOUNTER — Ambulatory Visit (INDEPENDENT_AMBULATORY_CARE_PROVIDER_SITE_OTHER): Payer: Medicare Other | Admitting: Family Medicine

## 2022-10-04 VITALS — BP 134/60 | HR 63 | Temp 98.0°F | Ht 64.0 in | Wt 199.2 lb

## 2022-10-04 DIAGNOSIS — K219 Gastro-esophageal reflux disease without esophagitis: Secondary | ICD-10-CM | POA: Diagnosis not present

## 2022-10-04 DIAGNOSIS — J324 Chronic pansinusitis: Secondary | ICD-10-CM

## 2022-10-04 DIAGNOSIS — Z13820 Encounter for screening for osteoporosis: Secondary | ICD-10-CM | POA: Diagnosis not present

## 2022-10-04 DIAGNOSIS — M545 Low back pain, unspecified: Secondary | ICD-10-CM

## 2022-10-04 DIAGNOSIS — I5020 Unspecified systolic (congestive) heart failure: Secondary | ICD-10-CM

## 2022-10-04 DIAGNOSIS — E1169 Type 2 diabetes mellitus with other specified complication: Secondary | ICD-10-CM

## 2022-10-04 DIAGNOSIS — I152 Hypertension secondary to endocrine disorders: Secondary | ICD-10-CM | POA: Diagnosis not present

## 2022-10-04 DIAGNOSIS — E782 Mixed hyperlipidemia: Secondary | ICD-10-CM

## 2022-10-04 DIAGNOSIS — E1142 Type 2 diabetes mellitus with diabetic polyneuropathy: Secondary | ICD-10-CM | POA: Diagnosis not present

## 2022-10-04 DIAGNOSIS — J3801 Paralysis of vocal cords and larynx, unilateral: Secondary | ICD-10-CM | POA: Diagnosis not present

## 2022-10-04 DIAGNOSIS — Z1231 Encounter for screening mammogram for malignant neoplasm of breast: Secondary | ICD-10-CM

## 2022-10-04 DIAGNOSIS — I428 Other cardiomyopathies: Secondary | ICD-10-CM

## 2022-10-04 DIAGNOSIS — M1A9XX0 Chronic gout, unspecified, without tophus (tophi): Secondary | ICD-10-CM

## 2022-10-04 DIAGNOSIS — G8929 Other chronic pain: Secondary | ICD-10-CM

## 2022-10-04 DIAGNOSIS — N1831 Chronic kidney disease, stage 3a: Secondary | ICD-10-CM | POA: Insufficient documentation

## 2022-10-04 DIAGNOSIS — E1159 Type 2 diabetes mellitus with other circulatory complications: Secondary | ICD-10-CM

## 2022-10-04 LAB — CBC WITH DIFFERENTIAL/PLATELET
Basophils Absolute: 0 10*3/uL (ref 0.0–0.1)
Basophils Relative: 0.9 % (ref 0.0–3.0)
Eosinophils Absolute: 0.4 10*3/uL (ref 0.0–0.7)
Eosinophils Relative: 6.6 % — ABNORMAL HIGH (ref 0.0–5.0)
HCT: 37.6 % (ref 36.0–46.0)
Hemoglobin: 11.9 g/dL — ABNORMAL LOW (ref 12.0–15.0)
Lymphocytes Relative: 31.3 % (ref 12.0–46.0)
Lymphs Abs: 1.8 10*3/uL (ref 0.7–4.0)
MCHC: 31.7 g/dL (ref 30.0–36.0)
MCV: 86.3 fl (ref 78.0–100.0)
Monocytes Absolute: 0.4 10*3/uL (ref 0.1–1.0)
Monocytes Relative: 7.9 % (ref 3.0–12.0)
Neutro Abs: 3 10*3/uL (ref 1.4–7.7)
Neutrophils Relative %: 53.3 % (ref 43.0–77.0)
Platelets: 283 10*3/uL (ref 150.0–400.0)
RBC: 4.36 Mil/uL (ref 3.87–5.11)
RDW: 14.7 % (ref 11.5–15.5)
WBC: 5.7 10*3/uL (ref 4.0–10.5)

## 2022-10-04 LAB — URIC ACID: Uric Acid, Serum: 6.3 mg/dL (ref 2.4–7.0)

## 2022-10-04 LAB — COMPREHENSIVE METABOLIC PANEL
ALT: 14 U/L (ref 0–35)
AST: 19 U/L (ref 0–37)
Albumin: 3.7 g/dL (ref 3.5–5.2)
Alkaline Phosphatase: 99 U/L (ref 39–117)
BUN: 24 mg/dL — ABNORMAL HIGH (ref 6–23)
CO2: 27 mEq/L (ref 19–32)
Calcium: 9.8 mg/dL (ref 8.4–10.5)
Chloride: 103 mEq/L (ref 96–112)
Creatinine, Ser: 1.22 mg/dL — ABNORMAL HIGH (ref 0.40–1.20)
GFR: 43.03 mL/min — ABNORMAL LOW (ref >60.00)
Glucose, Bld: 111 mg/dL — ABNORMAL HIGH (ref 70–99)
Potassium: 4.4 mEq/L (ref 3.5–5.1)
Sodium: 139 mEq/L (ref 135–145)
Total Bilirubin: 0.4 mg/dL (ref 0.2–1.2)
Total Protein: 6.8 g/dL (ref 6.0–8.3)

## 2022-10-04 LAB — LIPID PANEL
Cholesterol: 109 mg/dL (ref 0–200)
HDL: 57.9 mg/dL (ref >39.00)
LDL Cholesterol: 31 mg/dL (ref 0–99)
NonHDL: 50.95
Total CHOL/HDL Ratio: 2
Triglycerides: 102 mg/dL (ref 0.0–149.0)
VLDL: 20.4 mg/dL (ref 0.0–40.0)

## 2022-10-04 LAB — HEMOGLOBIN A1C: Hgb A1c MFr Bld: 7.5 % — ABNORMAL HIGH (ref 4.6–6.5)

## 2022-10-04 LAB — MICROALBUMIN / CREATININE URINE RATIO
Creatinine,U: 104.6 mg/dL
Microalb Creat Ratio: 1.7 mg/g (ref 0.0–30.0)
Microalb, Ur: 1.8 mg/dL (ref 0.0–1.9)

## 2022-10-04 LAB — TSH: TSH: 1.87 u[IU]/mL (ref 0.35–5.50)

## 2022-10-04 NOTE — Patient Instructions (Signed)
Please return in 6 months for recheck  I will release your lab results to you on your MyChart account with further instructions. You may see the results before I do, but when I review them I will send you a message with my report or have my assistant call you if things need to be discussed. Please reply to my message with any questions. Thank you!   Restart your zyrtec.  I have ordered a mammogram and/or bone density for you as we discussed today: '[x]'$   Mammogram  '[]'$   Bone Density  Please call the office checked below to schedule your appointment:  '[x]'$   [facility]      [place], [place]         '[]'$   [facility]  [place], [place]   If you have any questions or concerns, please don't hesitate to send me a message via MyChart or call the office at [phone]. Thank you for visiting with [place] today! It's our pleasure caring for you.

## 2022-10-04 NOTE — Progress Notes (Signed)
Subjective  Chief Complaint  Patient presents with   Annual Exam    Pt here for Annual Exam and is currently fasting     HPI: [name] is a 77 y.o. female who presents to [facility] [place] today for a Female Wellness Visit. She also has the concerns and/or needs as listed above in the chief complaint. These will be addressed in addition to the Health Maintenance Visit.   Wellness Visit: annual visit with health maintenance review and exam without Pap  HM: due mammo next month. Nl bone density 2022. Doing well overall. Ambulates with cane. Nl mood and cognition.   Chronic disease f/u and/or acute problem visit: (deemed necessary to be done in addition to the wellness visit): Diabetes: fasting glucose 114 today. Tolerating metformin, lantus, bcise and farxiga. No lows. No foot sxs Reviewed ENT for chronic sinusitis: recent sinus surgery did well. Still with yellow discharge. No pain.  [place]: on flonase. Had been on zyrtec HTN and cardiomyopathy: reviewed cardiology notes and labs and reports. Euvolemic. Nonischemic. On CHF meds. Bp limits some meds. 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 Did PT. Minimally improved but she can live with it. GERD on both pepcid and PPI. Controlled.  Gout w/o recent flare CKD: no edema.   Assessment  1. Type 2 diabetes mellitus with peripheral neuropathy (HCC)   2. Screening for osteoporosis   3. Vocal fold paresis, left   4. Chronic pansinusitis   5. Hypertension associated with diabetes ([place])   6. Congestive heart failure with left ventricular systolic dysfunction ([place])   7. Nonischemic cardiomyopathy ([place])   8. Combined hyperlipidemia associated with type 2 diabetes mellitus ([place])   9. Gastroesophageal reflux disease, unspecified whether esophagitis present   10. Chronic low back pain, unspecified back pain laterality, unspecified whether sciatica present    11. Chronic gout without tophus, unspecified cause, unspecified site   12. Encounter for screening mammogram for breast cancer   13. Stage 3a chronic kidney disease [facility])      Plan  Female Wellness Visit: Age appropriate Health Maintenance and Prevention measures were discussed with patient. Included topics are cancer screening recommendations, ways to keep healthy (see AVS) including dietary and exercise recommendations, regular eye and dental care, use of seat belts, and avoidance of moderate alcohol use and tobacco use.  Patient to schedule mammogram BMI: discussed patient's BMI and encouraged positive lifestyle modifications to help get to or maintain a target BMI. HM needs and immunizations were addressed and ordered. See below for orders. See HM and immunization section for updates. Routine labs and screening tests ordered including cmp, cbc and lipids where appropriate. Discussed recommendations regarding Vit D and calcium supplementation (see AVS)  Chronic disease management visit and/or acute problem visit: Diabetes: Check A1c today.  Continue Farxiga 10, resized 2 mg weekly, metformin 500 twice daily and Lantus 15 units at night.  Screen urine. Chronic kidney disease likely due to nephrosclerosis from hypertension.  Recheck with creatinine today. Nonischemic cardiomyopathy: Euvolemic.  On multiple heart failure medicines.  Continue to follow-up with congestive heart failure clinic. Blood pressure is controlled Continue GERD medications Recheck lipids on statin. Recheck uric acid on allopurinol. No changes in medications today.  I spent a total of 42 minutes for this patient encounter. Time spent included preparation, face-to-face counseling with the patient and coordination of care, review of chart and records, and documentation of the  encounter.  Follow up: 6 months for recheck Orders Placed This Encounter  Procedures   MM DIGITAL SCREENING BILATERAL   CBC with  Differential/Platelet   Comprehensive metabolic panel   Hemoglobin A1c   Lipid panel   TSH   Uric acid   Microalbumin / creatinine urine ratio   No orders of the defined types were placed in this encounter.     Body mass index is 34.19 kg/m. 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    Hypertension associated with diabetes ([place]) 11/08/2010    Priority: High   Vocal fold paresis, left 04/21/2022    Priority: Medium     ENT eval 2023; voice PT.     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    Priority: Medium    Gout 07/19/2021    Priority: Medium     On allopurinol    Rotator cuff tear arthropathy 10/22/2020    Priority: Medium    Primary osteoarthritis of left shoulder 07/29/2020    Priority: Medium    DDD (degenerative disc disease), cervical 04/30/2020    Priority: Medium    Osteoarthritis of left knee 11/05/2018    Priority: Medium    Chronic low back pain 09/05/2017    Priority: Medium    Degenerative lumbar disc 06/04/2015    Priority: Medium    S/P lumbar laminectomy 06/04/2015    Priority: Medium    Restless leg syndrome 05/13/2014    Priority: Medium    Spinal stenosis of lumbar region without neurogenic claudication 09/12/2012    Priority: Medium     Formatting of this note might be different from the original. Right L4/5    GERD  (gastroesophageal reflux disease) 05/25/2011    Priority: Medium     Chronic recurrent heartburn symptoms for 3 or 4 years without weight loss, bleeding or dysphagia. Bonds well to daily PPI.Gatha Mayer, MD, [facility]     Mitral valve prolapse 10/10/2008    Priority: Medium     Formatting of this note might be different from the original. Prolapsing Mitral Valve Leaflet Syndrome - echo at [facility] 2008, palpitations are symptomatic    Screening for osteoporosis 10/04/2022    Priority: Low    Dexa 2022: normal. Recheck 77 years    Old tear of meniscus of left knee 09/28/2021    Priority: Low   S/P shoulder replacement, right 12/14/2020    Priority: Low   Chronic pansinusitis 10/04/2017    Priority: Low    Dr. Benjamine Mola, CT scan, sinus surgery 2023    Hemorrhoids, internal, with bleeding 05/25/2011    Priority: Low    Visible on 2005 colonoscopy and endoscopy 05/25/2011.    Chronic allergic rhinitis 08/19/2010    Priority: Low   Stage 3a chronic kidney disease ([place]) 10/04/2022  Health Maintenance  Topic Date Due   DTaP/Tdap/Td (4 - Td or Tdap) 05/01/2014   HEMOGLOBIN A1C  10/04/2022   COVID-19 Vaccine (4 - 2023-24 season) 10/20/2022 (Originally 04/01/2022)   MAMMOGRAM  10/23/2022   Diabetic kidney evaluation - Urine ACR  12/30/2022   Medicare Annual Wellness (AWV)  05/07/2023   OPHTHALMOLOGY EXAM  08/05/2023   Diabetic kidney evaluation - eGFR measurement  08/10/2023   FOOT EXAM  10/04/2023   DEXA SCAN  02/11/2026   Pneumonia Vaccine 79+ Years old  Completed   INFLUENZA VACCINE  Completed   Zoster Vaccines- Shingrix  Completed   HPV VACCINES  Aged Out   COLONOSCOPY (Pts 45-61yr Insurance coverage will need to be confirmed)  Discontinued   Hepatitis C Screening  Discontinued   Immunization History  Administered Date(s) Administered   Fluad Quad(high Dose 65+) 04/19/2018, 06/16/2020, 07/01/2020, 04/16/2021, 05/10/2022   Influenza Split 06/01/2010   Influenza, High Dose  Seasonal PF 05/13/2014, 04/24/2015, 04/12/2017, 04/19/2018, 04/24/2019, 06/16/2020   Influenza, Seasonal, Injecte, Preservative Fre 04/09/2013, 05/03/2016   Influenza,inj,Quad PF,6+ Mos 05/01/2017   Influenza-Unspecified 04/03/2012   Moderna Sars-Covid-2 Vaccination 01/15/2021, 02/12/2021   Pneumococcal Conjugate-13 05/13/2014   Pneumococcal Polysaccharide-23 02/15/2011   Td 08/01/1998   Td (Adult), 2 Lf Tetanus Toxid, Preservative Free 08/01/1998   Tdap 05/01/2004   Unspecified SARS-COV-2 Vaccination 05/11/2021   Zoster Recombinat (Shingrix) 04/24/2019, 08/13/2019   Zoster, Live 02/15/2011   We updated and reviewed the patient's past history in detail and it is documented below. Allergies: Patient is allergic to amlodipine, depo-medrol [methylprednisolone], gabapentin, levothyroxine, sitagliptin, ace inhibitors, amoxicillin-pot clavulanate, betadine [povidone iodine], codeine, cortizone-10 [hydrocortisone], tetracyclines & related, and dexamethasone. 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 Past Surgical History Patient  has a past surgical history that includes Hysteroscopy with D & C (08/23/2000); Lumbar disc surgery (03/16/2005); Plantar fascia release (Left, 02/03/2010); Hemorrhoid surgery; Tonsillectomy; Wrist surgery; Tubal ligation; Colonoscopy (07/12/2004); Bunionectomy (Bilateral); Colonoscopy; Lumbar epidural injection; Finger arthroscopy  with carpometacarpel(cmc) arthroplasty (Right, 09/28/2015); Tendon transfer (Right, 09/28/2015); RIGHT/LEFT HEART CATH AND CORONARY ANGIOGRAPHY (N/A, 09/07/2020); Reverse shoulder arthroplasty (Right, 12/14/2020); Turbinate reduction (Bilateral, 08/15/2022); Maxillary antrostomy (Bilateral, 08/15/2022); Frontal sinus exploration (Bilateral, 08/15/2022); Ethmoidectomy (Bilateral, 08/15/2022); and Sinus endo w/fusion (Bilateral, 08/15/2022). Family History: Patient family history includes Pancreatic cancer in her father. Social History:  Patient  reports that she has never smoked. She has never used smokeless tobacco. She reports that she does not drink alcohol and does not use drugs.  Review of Systems: Constitutional: negative for fever or malaise Ophthalmic: negative for photophobia, double vision or loss of vision Cardiovascular: negative for chest pain, dyspnea on exertion, or new LE swelling Respiratory: negative for SOB or persistent cough Gastrointestinal: negative for abdominal pain, change in bowel habits or melena Genitourinary: negative for dysuria or gross hematuria, no abnormal uterine bleeding or disharge Musculoskeletal: negative for new gait disturbance or muscular weakness Integumentary: negative for new or persistent rashes, no breast lumps Neurological: negative for TIA or stroke symptoms Psychiatric: negative for SI or delusions Allergic/Immunologic: negative for hives  Patient Care Team  Relationship Specialty Notifications Start End  Leamon Arnt, MD PCP - General Family Medicine  08/24/20   Freada Bergeron, MD PCP - Cardiology Cardiology  08/05/20   Rozetta Nunnery, MD (Inactive) Consulting Physician Otolaryngology  01/14/21   Netta Cedars, MD Consulting Physician Orthopedic Surgery  01/14/21     Objective  Vitals: BP 134/60   Pulse 63   Temp 98 F (36.7 C)   Ht '5\' 4"'$  (1.626 m)   Wt 199 lb 3.2 oz (90.4 kg)   SpO2 97%   BMI 34.19 kg/m  General:  Well  developed, well nourished, no acute distress, hoarse Psych:  Alert and orientedx3,normal mood and affect HEENT:  Normocephalic, atraumatic, non-icteric sclera,  supple neck without adenopathy, mass or thyromegaly Cardiovascular:  Normal S1, S2, RRR without gallop, rub or murmur Respiratory:  Good breath sounds bilaterally, CTAB with normal respiratory effort Gastrointestinal: normal bowel sounds, soft, non-tender, no noted masses. No HSM Ext w/o edema. No foot lesions  Commons side effects, risks, benefits, and alternatives for medications and treatment plan prescribed today were discussed, and the patient expressed understanding of the given instructions. Patient is instructed to call or message via MyChart if he/she has any questions or concerns regarding our treatment plan. No barriers to understanding were identified. We discussed Red Flag symptoms and signs in detail. Patient expressed understanding regarding what to do in case of urgent or emergency type symptoms.  Medication list was reconciled, printed and provided to the patient in AVS. Patient instructions and summary information was reviewed with the patient as documented in the AVS. This note was prepared with assistance of Dragon voice recognition software. Occasional wrong-word or sound-a-like substitutions may have occurred due to the inherent limitations of voice recognition software

## 2022-10-05 ENCOUNTER — Other Ambulatory Visit: Payer: Self-pay | Admitting: Family Medicine

## 2022-10-05 MED ORDER — LANTUS SOLOSTAR 100 UNIT/ML ~~LOC~~ SOPN: 20.0000 [IU] | PEN_INJECTOR | Freq: Every day | SUBCUTANEOUS | 5 refills | Status: DC

## 2022-10-14 NOTE — Telephone Encounter (Signed)
The insurance company will pay for Techlite pin needles or Assure pin needles. Please advise.

## 2022-10-18 ENCOUNTER — Other Ambulatory Visit: Payer: Self-pay

## 2022-10-18 MED ORDER — ASSURE ID SAFETY PEN NEEDLES 30G X 5 MM MISC: 1 refills | Status: DC

## 2022-10-20 ENCOUNTER — Other Ambulatory Visit: Payer: Self-pay | Admitting: Family Medicine

## 2022-11-11 ENCOUNTER — Other Ambulatory Visit: Payer: Self-pay

## 2022-11-11 MED ORDER — FUROSEMIDE 20 MG PO TABS: 20.0000 mg | ORAL_TABLET | ORAL | 7 refills | Status: DC

## 2022-11-16 ENCOUNTER — Ambulatory Visit
Admission: RE | Admit: 2022-11-16 | Discharge: 2022-11-16 | Disposition: A | Discharge status: Home / Self Care | Payer: Medicare Other | Source: Ambulatory Visit | Attending: Family Medicine | Admitting: Family Medicine

## 2022-11-16 DIAGNOSIS — Z1231 Encounter for screening mammogram for malignant neoplasm of breast: Secondary | ICD-10-CM

## 2022-12-02 ENCOUNTER — Other Ambulatory Visit: Payer: Self-pay

## 2022-12-02 DIAGNOSIS — E1159 Type 2 diabetes mellitus with other circulatory complications: Secondary | ICD-10-CM

## 2022-12-02 DIAGNOSIS — I493 Ventricular premature depolarization: Secondary | ICD-10-CM

## 2022-12-02 DIAGNOSIS — I5020 Unspecified systolic (congestive) heart failure: Secondary | ICD-10-CM

## 2022-12-02 DIAGNOSIS — I428 Other cardiomyopathies: Secondary | ICD-10-CM

## 2022-12-02 DIAGNOSIS — E119 Type 2 diabetes mellitus without complications: Secondary | ICD-10-CM

## 2022-12-02 DIAGNOSIS — N1831 Chronic kidney disease, stage 3a: Secondary | ICD-10-CM

## 2022-12-02 DIAGNOSIS — I2510 Atherosclerotic heart disease of native coronary artery without angina pectoris: Secondary | ICD-10-CM

## 2022-12-02 MED ORDER — CARVEDILOL 3.125 MG PO TABS: 3.1250 mg | ORAL_TABLET | Freq: Two times a day (BID) | ORAL | 1 refills | Status: DC

## 2022-12-15 ENCOUNTER — Ambulatory Visit: Payer: Medicare Other | Admitting: Cardiology

## 2022-12-22 ENCOUNTER — Telehealth: Payer: Self-pay

## 2022-12-22 NOTE — Telephone Encounter (Signed)
**Note De-Identified  Obfuscation** Farxiga 10 mg RX form received from [place] [employer]. I have completed the form for #90 with 3 refills, Dr Shari Prows signed and dated it, and I have faxed to [facility]. I did receive confirmation that the fax sent successfully.

## 2022-12-28 ENCOUNTER — Telehealth: Payer: Self-pay | Admitting: *Deleted

## 2022-12-28 NOTE — Telephone Encounter (Signed)
   Name: [name]  DOB: 1945-09-26  MRN: [id number]  Primary Cardiologist: Meriam Sprague, MD   Preoperative team, please contact this patient and set up a phone call appointment for further preoperative risk assessment. Please obtain consent and complete medication review. Thank you for your help.  I confirm that guidance regarding antiplatelet and oral anticoagulation therapy has been completed and, if necessary, noted below.  None   Napoleon Form, Leodis Rains, NP 12/28/2022, 10:47 AM [facility]

## 2022-12-28 NOTE — Telephone Encounter (Signed)
Tried to call the pt to set up tele appt, though no answer.

## 2022-12-28 NOTE — Telephone Encounter (Signed)
   Pre-operative Risk Assessment    Patient Name: [name]  DOB: May 02, 1946 MRN: [id number]      Request for Surgical Clearance    Procedure:   LEFT TOTAL KNEE ARTHROPLASTY  Date of Surgery:  Clearance 04/24/23                                 Surgeon:  DR. Ollen Gross Surgeon's Group or Practice Name:  [facility] Phone number:  [phone] ATTN: Aida Raider Fax number:  [phone]   Type of Clearance Requested:   - Medical ; NO MEDICATIONS LISTED AS NEEDING TO BE HELD   Type of Anesthesia:   CHOICE   Additional requests/questions:    Elpidio Anis   12/28/2022, 10:00 AM

## 2022-12-29 NOTE — Telephone Encounter (Signed)
2nd attempt to reach the pt to set up tele pre op appt.  

## 2022-12-30 ENCOUNTER — Telehealth: Payer: Self-pay | Admitting: *Deleted

## 2022-12-30 NOTE — Telephone Encounter (Signed)
Spoke with patient and scheduled her for a telehealth pre op appointment on 03/27/23 at 9:00 AM. Will route to requesting surgeons office to make them aware.

## 2022-12-30 NOTE — Telephone Encounter (Signed)
  Patient Consent for Virtual Visit        [name] has provided verbal consent on 12/30/2022 for a virtual visit (video or telephone).   CONSENT FOR VIRTUAL VISIT FOR:  [name]  By participating in this virtual visit I agree to the following:  I hereby voluntarily request, consent and authorize [facility] and its employed or contracted physicians, physician assistants, nurse practitioners or other licensed health care professionals (the Practitioner), to provide me with telemedicine health care services (the "Services") as deemed necessary by the treating Practitioner. I acknowledge and consent to receive the Services by the Practitioner via telemedicine. I understand that the telemedicine visit will involve communicating with the Practitioner through live audiovisual communication technology and the disclosure of certain medical information by electronic transmission. I acknowledge that I have been given the opportunity to request an in-person assessment or other available alternative prior to the telemedicine visit and am voluntarily participating in the telemedicine visit.  I understand that I have the right to withhold or withdraw my consent to the use of telemedicine in the course of my care at any time, without affecting my right to future care or treatment, and that the Practitioner or I may terminate the telemedicine visit at any time. I understand that I have the right to inspect all information obtained and/or recorded in the course of the telemedicine visit and may receive copies of available information for a reasonable fee.  I understand that some of the potential risks of receiving the Services via telemedicine include:  Delay or interruption in medical evaluation due to technological equipment failure or disruption; Information transmitted may not be sufficient (e.g. poor resolution of images) to allow for appropriate medical decision making by the Practitioner;  and/or  In rare instances, security protocols could fail, causing a breach of personal health information.  Furthermore, I acknowledge that it is my responsibility to provide information about my medical history, conditions and care that is complete and accurate to the best of my ability. I acknowledge that Practitioner's advice, recommendations, and/or decision may be based on factors not within their control, such as incomplete or inaccurate data provided by me or distortions of diagnostic images or specimens that may result from electronic transmissions. I understand that the practice of medicine is not an exact science and that Practitioner makes no warranties or guarantees regarding treatment outcomes. I acknowledge that a copy of this consent can be made available to me via my patient portal [facility] MyChart), or I can request a printed copy by calling the office of [facility].    I understand that my insurance will be billed for this visit.   I have read or had this consent read to me. I understand the contents of this consent, which adequately explains the benefits and risks of the Services being provided via telemedicine.  I have been provided ample opportunity to ask questions regarding this consent and the Services and have had my questions answered to my satisfaction. I give my informed consent for the services to be provided through the use of telemedicine in my medical care

## 2023-01-04 ENCOUNTER — Other Ambulatory Visit: Payer: Self-pay | Admitting: Family Medicine

## 2023-01-06 ENCOUNTER — Ambulatory Visit (INDEPENDENT_AMBULATORY_CARE_PROVIDER_SITE_OTHER): Payer: Medicare Other | Admitting: Family Medicine

## 2023-01-06 ENCOUNTER — Encounter: Payer: Self-pay | Admitting: Family Medicine

## 2023-01-06 VITALS — BP 124/72 | HR 69 | Temp 98.1°F | Ht 64.0 in | Wt 196.4 lb

## 2023-01-06 DIAGNOSIS — E1159 Type 2 diabetes mellitus with other circulatory complications: Secondary | ICD-10-CM

## 2023-01-06 DIAGNOSIS — Z01818 Encounter for other preprocedural examination: Secondary | ICD-10-CM

## 2023-01-06 DIAGNOSIS — Z794 Long term (current) use of insulin: Secondary | ICD-10-CM

## 2023-01-06 DIAGNOSIS — I152 Hypertension secondary to endocrine disorders: Secondary | ICD-10-CM

## 2023-01-06 DIAGNOSIS — G8929 Other chronic pain: Secondary | ICD-10-CM

## 2023-01-06 DIAGNOSIS — M545 Low back pain, unspecified: Secondary | ICD-10-CM

## 2023-01-06 DIAGNOSIS — E1142 Type 2 diabetes mellitus with diabetic polyneuropathy: Secondary | ICD-10-CM | POA: Diagnosis not present

## 2023-01-06 DIAGNOSIS — I428 Other cardiomyopathies: Secondary | ICD-10-CM

## 2023-01-06 LAB — POCT GLYCOSYLATED HEMOGLOBIN (HGB A1C): Hemoglobin A1C: 6.6 % — AB (ref 4.0–5.6)

## 2023-01-06 NOTE — Progress Notes (Signed)
Subjective  CC:  Chief Complaint  Patient presents with   Diabetes    HPI: [name] is a 77 y.o. female who presents to the office today for follow up of diabetes and problems listed above in the chief complaint.  Diabetes follow up: Her diabetic control is reported as Improved.  We increase Lantus from 15 to 20 units nightly and this has improved her control.  She checks fastings intermittently, they range from 1 10-1 30.  No symptoms of hypoglycemia or hyperglycemia.  She denies exertional CP or SOB or symptomatic hypoglycemia. She denies foot sores or paresthesias.  To have left knee replacement: Preop clearance: She will see cardiology in August.  Surgery is scheduled for September.  She has a form to complete.  She denies chest pain, shortness of breath, foot sores or problems taking medications. She does have more back pain than usual today, she has been painting at home, kitchen, hallway and living room!  Blood pressure is elevated in the office but I do believe this is because of her back pain.  Home readings range 120s over 70s.  Wt Readings from Last 3 Encounters:  01/06/23 196 lb 6.4 oz (89.1 kg)  10/04/22 199 lb 3.2 oz (90.4 kg)  08/15/22 198 lb 10.2 oz (90.1 kg)    BP Readings from Last 3 Encounters:  01/06/23 124/72  10/04/22 134/60  08/15/22 101/85    Assessment  1. Type 2 diabetes mellitus with peripheral neuropathy (HCC)   2. Preoperative clearance   3. Nonischemic cardiomyopathy (HCC)   4. Hypertension associated with diabetes (HCC)   5. Chronic low back pain, unspecified back pain laterality, unspecified whether sciatica present      Plan  Diabetes is currently very well controlled.  Continue current medications.  Continue diet.  She will follow-up with me prior to her surgery if she has any concerns regarding her diabetic control. Hypertension is controlled by home readings. Will get cardiovascular clearance Preoperative clearance for medical given  today.  Form faxed.  No change in medications  Follow up: 6 months for recheck sooner if needed Orders Placed This Encounter  Procedures   POCT HgB A1C   No orders of the defined types were placed in this encounter.     Immunization History  Administered Date(s) Administered   Fluad Quad(high Dose 65+) 04/19/2018, 06/16/2020, 07/01/2020, 04/16/2021, 05/10/2022   Influenza Split 06/01/2010   Influenza, High Dose Seasonal PF 05/13/2014, 04/24/2015, 04/12/2017, 04/19/2018, 04/24/2019, 06/16/2020   Influenza, Seasonal, Injecte, Preservative Fre 04/09/2013, 05/03/2016   Influenza,inj,Quad PF,6+ Mos 05/01/2017   Influenza-Unspecified 04/03/2012   Moderna Sars-Covid-2 Vaccination 01/15/2021, 02/12/2021   Pneumococcal Conjugate-13 05/13/2014   Pneumococcal Polysaccharide-23 02/15/2011   Td 08/01/1998   Td (Adult), 2 Lf Tetanus Toxid, Preservative Free 08/01/1998   Tdap 05/01/2004   Unspecified SARS-COV-2 Vaccination 05/11/2021   Zoster Recombinat (Shingrix) 04/24/2019, 08/13/2019   Zoster, Live 02/15/2011    Diabetes Related Lab Review: Lab Results  Component Value Date   HGBA1C 6.6 (A) 01/06/2023   HGBA1C 7.5 (H) 10/04/2022   HGBA1C 6.8 (A) 04/05/2022    Lab Results  Component Value Date   MICROALBUR 1.8 10/04/2022   Lab Results  Component Value Date   CREATININE 1.22 (H) 10/04/2022   BUN 24 (H) 10/04/2022   NA 139 10/04/2022   K 4.4 10/04/2022   CL 103 10/04/2022   CO2 27 10/04/2022   Lab Results  Component Value Date   CHOL 109 10/04/2022  CHOL 111 09/28/2021   Lab Results  Component Value Date   HDL 57.90 10/04/2022   HDL 55.70 09/28/2021   Lab Results  Component Value Date   LDLCALC 31 10/04/2022   LDLCALC 37 09/28/2021   Lab Results  Component Value Date   TRIG 102.0 10/04/2022   TRIG 91.0 09/28/2021   TRIG 194 (H) 08/13/2020   Lab Results  Component Value Date   CHOLHDL 2 10/04/2022   CHOLHDL 2 09/28/2021   No results found for:  "LDLDIRECT" The ASCVD Risk score (Arnett DK, et al., 2019) failed to calculate for the following reasons:   The valid total cholesterol range is 130 to 320 mg/dL I have reviewed the PMH, Fam and Soc history. 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    Priority: Medium    Gout 07/19/2021    Priority: Medium     On allopurinol    Rotator cuff tear arthropathy 10/22/2020    Priority: Medium    Primary osteoarthritis of left shoulder 07/29/2020    Priority: Medium    DDD (degenerative disc disease), cervical 04/30/2020    Priority: Medium    Osteoarthritis of left knee 11/05/2018    Priority: Medium    Chronic low back pain 09/05/2017    Priority: Medium    Degenerative lumbar disc 06/04/2015    Priority: Medium    S/P lumbar laminectomy 06/04/2015    Priority: Medium    Restless leg syndrome 05/13/2014    Priority: Medium    Spinal stenosis of lumbar region without neurogenic claudication 09/12/2012    Priority: Medium     Formatting of this note might be different from the original. Right L4/5    GERD (gastroesophageal reflux disease) 05/25/2011    Priority: Medium     Chronic recurrent heartburn symptoms for 3 or 4 years without weight loss, bleeding or dysphagia. Bonds well to daily  PPI.Iva Boop, MD, [facility]     Mitral valve prolapse 10/10/2008    Priority: Medium     Formatting of this note might be different from the original. Prolapsing Mitral Valve Leaflet Syndrome - echo at [facility] 2008, palpitations are symptomatic    Screening for osteoporosis 10/04/2022    Priority: Low    Dexa 2022: normal. Recheck 77 years    Old tear of meniscus of left knee 09/28/2021    Priority: Low   S/P shoulder replacement, right 12/14/2020    Priority: Low   Chronic pansinusitis 10/04/2017    Priority: Low    Dr. Suszanne Conners, CT scan, sinus surgery 2023    Hemorrhoids, internal, with bleeding 05/25/2011    Priority: Low    Visible on 2005 colonoscopy and endoscopy 05/25/2011.    Chronic allergic  rhinitis 08/19/2010    Priority: Low   Stage 3a chronic kidney disease (HCC) 10/04/2022    Social History: Patient  reports that she has never smoked. She has never used smokeless tobacco. She reports that she does not drink alcohol and does not use drugs.  Review of Systems: Ophthalmic: negative for eye pain, loss of vision or double vision Cardiovascular: negative for chest pain Respiratory: negative for SOB or persistent cough Gastrointestinal: negative for abdominal pain Genitourinary: negative for dysuria or gross hematuria MSK: negative for foot lesions Neurologic: negative for weakness or gait disturbance  Objective  Vitals: BP 124/72 Comment: home readings  Pulse 69   Temp 98.1 F (36.7 C)   Ht 5\' 4"  (1.626 m)   Wt 196 lb 6.4 oz (89.1 kg)   SpO2 93%   BMI 33.71 kg/m  General: well appearing, no acute distress  Psych:  Alert and oriented, normal mood and affect HEENT:  Normocephalic, atraumatic, moist mucous membranes, supple neck  Cardiovascular:  Nl S1 and S2, RRR without murmur, gallop or rub. no edema Respiratory:  Good breath sounds bilaterally, CTAB with normal effort, no rales   Diabetic education: ongoing education regarding chronic disease  management for diabetes was given today. We continue to reinforce the ABC's of diabetic management: A1c (<7 or 8 dependent upon patient), tight blood pressure control, and cholesterol management with goal LDL < 100 minimally. We discuss diet strategies, exercise recommendations, medication options and possible side effects. At each visit, we review recommended immunizations and preventive care recommendations for diabetics and stress that good diabetic control can prevent other problems. See below for this patient's data.   Commons side effects, risks, benefits, and alternatives for medications and treatment plan prescribed today were discussed, and the patient expressed understanding of the given instructions. Patient is instructed to call or message via MyChart if he/she has any questions or concerns regarding our treatment plan. No barriers to understanding were identified. We discussed Red Flag symptoms and signs in detail. Patient expressed understanding regarding what to do in case of urgent or emergency type symptoms.  Medication list was reconciled, printed and provided to the patient in AVS. Patient instructions and summary information was reviewed with the patient as documented in the AVS. This note was prepared with assistance of Dragon voice recognition software. Occasional wrong-word or sound-a-like substitutions may have occurred due to the inherent limitations of voice recognition software

## 2023-01-06 NOTE — Patient Instructions (Signed)
Please return in 6 months for recheck ° °If you have any questions or concerns, please don't hesitate to send me a message via MyChart or call the office at [phone]. Thank you for visiting with us today! It's our pleasure caring for you.  °

## 2023-01-10 ENCOUNTER — Other Ambulatory Visit: Payer: Self-pay

## 2023-01-10 ENCOUNTER — Telehealth: Payer: Self-pay | Admitting: Family Medicine

## 2023-01-10 NOTE — Telephone Encounter (Signed)
Prescription Request  01/10/2023  LOV: 01/06/2023  What is the name of the medication or equipment?  Exenatide ER (BYDUREON BCISE) 2 MG/0.85ML AUIJ  Have you contacted your pharmacy to request a refill? Yes   Which pharmacy would you like this sent to?  [facility] [address] [place], [place] [address] Ph# [phone]   Patient notified that their request is being sent to the clinical staff for review and that they should receive a response within 2 business days.   Please advise at [facility] [phone]

## 2023-01-11 ENCOUNTER — Other Ambulatory Visit: Payer: Self-pay

## 2023-01-11 MED ORDER — BYDUREON BCISE 2 MG/0.85ML ~~LOC~~ AUIJ: 2.0000 mg | AUTO-INJECTOR | SUBCUTANEOUS | 3 refills | Status: DC

## 2023-01-11 NOTE — Telephone Encounter (Signed)
Rx faxed

## 2023-01-18 ENCOUNTER — Other Ambulatory Visit: Payer: Self-pay | Admitting: Family Medicine

## 2023-01-30 ENCOUNTER — Other Ambulatory Visit: Payer: Self-pay | Admitting: Family Medicine

## 2023-02-02 ENCOUNTER — Other Ambulatory Visit (HOSPITAL_COMMUNITY): Payer: Self-pay

## 2023-02-11 ENCOUNTER — Other Ambulatory Visit (HOSPITAL_COMMUNITY): Payer: Self-pay

## 2023-02-27 ENCOUNTER — Other Ambulatory Visit: Payer: Self-pay | Admitting: *Deleted

## 2023-02-27 MED ORDER — FUROSEMIDE 20 MG PO TABS: 20.0000 mg | ORAL_TABLET | ORAL | 4 refills | Status: DC

## 2023-03-26 NOTE — Progress Notes (Unsigned)
Virtual Visit via Telephone Note   Because of [name]'s co-morbid illnesses, she is at least at moderate risk for complications without adequate follow up.  This format is felt to be most appropriate for this patient at this time.  The patient did not have access to video technology/had technical difficulties with video requiring transitioning to audio format only (telephone).  All issues noted in this document were discussed and addressed.  No physical exam could be performed with this format.  Please refer to the patient's chart for her consent to telehealth for [facility].  Evaluation Performed:  Preoperative cardiovascular risk assessment _____________   Date:  03/26/2023   Patient ID:  [name], [name] 09-08-1945, MRN [id number] Patient Location:  Home Provider location:   Office  Primary Care Provider:  Willow Ora, MD Primary Cardiologist:  Meriam Sprague, MD  Chief Complaint / Patient Profile   77 y.o. y/o female with a h/o nonobstructive CAD per angiography, chronic systolic heart failure/nonischemic cardiomyopathy, mitral valve prolapse, hypertension, hyperlipidemia, T2DM, GERD, vocal fold paresis, CKD stage IIIa who is pending left total knee arthroplasty by Dr. Lequita Halt on 04/24/2023 and presents today for telephonic preoperative cardiovascular risk assessment.  History of Present Illness    [name] is a 77 y.o. female who presents via audio/video conferencing for a telehealth visit today.  Pt was last seen in cardiology clinic on 06/09/2022 by Dr. Shari Prows.  At that time [name] was stable from a cardiac standpoint.  The patient is now pending procedure as outlined above. Since her last visit, she is doing well. Patient denies shortness of breath or dyspnea on exertion. No chest pain, pressure, or tightness. Denies orthopnea or PND. She has chronic lower extremity edema that she manages with Lasix 4 days a week. No palpitations.  She  has been painting her home since May. She also performs yard work without chest pain or shortness of breath.   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) 08/24/2020   Diabetes mellitus without complication (HCC)    takes Trulicity,Jardiance,and Metformin daily.Average fasting blood sugar runs around130   GERD (gastroesophageal reflux disease)    takes Omeprazole daily   Gout 07/19/2021   On allopurinol   History of bronchitis > 8 yrs ago   History of shingles    HTN (hypertension)    takes Amlodipine and Micardis daily   Hx of colonic polyps    benign   Internal and external hemorrhoids without complication    Joint pain    Mitral valve prolapse    Nocturia    PONV (postoperative nausea and vomiting)    when gets injections in joints gets hives.Betadine rash   Restless leg syndrome    takes Requip at bedtime   Seasonal allergies    takes Claritin daily as needed   Urinary frequency    Uterine fibroid    Past Surgical History:  Procedure Laterality Date   BUNIONECTOMY Bilateral    COLONOSCOPY  07/12/2004   diverticulosis, internal and external hemorrhoids   COLONOSCOPY     ETHMOIDECTOMY Bilateral 08/15/2022   Procedure: ETHMOIDECTOMY WITH TISSUE REMOVAL;  Surgeon: Newman Pies, MD;  Location: [facility];  Service: ENT;  Laterality: Bilateral;   FINGER ARTHROSCOPY WITH CARPOMETACARPEL (CMC) ARTHROPLASTY Right 09/28/2015  Procedure: RIGHT THUMB TRAPEZIUM EXCISION WITH CARPOMETACARPEL (CMC) ARTHROPLASTY AND TENDON TRANSFER;  Surgeon: Bradly Bienenstock, MD;  Location: [facility];  Service: Orthopedics;  Laterality: Right;   FRONTAL SINUS EXPLORATION Bilateral 08/15/2022   Procedure: FRONTAL RECESS SINUS EXPLORATION WITH NAVIGATION;  Surgeon: Newman Pies, MD;  Location: [facility];  Service: ENT;   Laterality: Bilateral;   HEMORRHOID SURGERY     almost 40 yrs ago   HYSTEROSCOPY WITH D & C  08/23/2000   and resectoscopic myomectomy   LUMBAR DISC SURGERY  03/16/2005   LUMBAR EPIDURAL INJECTION     MAXILLARY ANTROSTOMY Bilateral 08/15/2022   Procedure: ENDOSCOPIC MAXILLARY ANTROSTOMY WITH TISSUE REMOVAL;  Surgeon: Newman Pies, MD;  Location: [facility];  Service: ENT;  Laterality: Bilateral;   PLANTAR FASCIA RELEASE Left 02/03/2010   and torn tendon   REVERSE SHOULDER ARTHROPLASTY Right 12/14/2020   Procedure: REVERSE SHOULDER ARTHROPLASTY;  Surgeon: Beverely Low, MD;  Location: [facility];  Service: Orthopedics;  Laterality: Right;   RIGHT/LEFT HEART CATH AND CORONARY ANGIOGRAPHY N/A 09/07/2020   Procedure: RIGHT/LEFT HEART CATH AND CORONARY ANGIOGRAPHY;  Surgeon: Yvonne Kendall, MD;  Location: [facility];  Service: Cardiovascular;  Laterality: N/A;   SINUS ENDO W/FUSION Bilateral 08/15/2022   Procedure: SPHENOIDECTOMY WITH TISSUE REMOVAL WITH NAVIGATION;  Surgeon: Newman Pies, MD;  Location: [facility];  Service: ENT;  Laterality: Bilateral;   TENDON TRANSFER Right 09/28/2015   Procedure: TENDON TRANSFER;  Surgeon: Bradly Bienenstock, MD;  Location: [facility];  Service: Orthopedics;  Laterality: Right;   TONSILLECTOMY     TUBAL LIGATION     TURBINATE REDUCTION Bilateral 08/15/2022   Procedure: TURBINATE REDUCTION;  Surgeon: Newman Pies, MD;  Location: [facility];  Service: ENT;  Laterality: Bilateral;   WRIST SURGERY     left, removal of cyst    Allergies  Allergies  Allergen Reactions   Amlodipine Swelling   Amoxicillin-Pot Clavulanate Hives   Depo-Medrol [Methylprednisolone] Hives   Gabapentin Rash    hives    Levothyroxine Other (See Comments)    severe muscle pain in neck     Sitagliptin Other (See Comments)    Urinary hesitancy Januvia   Betadine [Povidone Iodine]     rash   Codeine Nausea And Vomiting   Cortizone-10 [Hydrocortisone]  Other (See Comments)    unknown   Tetracyclines & Related Nausea And Vomiting   Ace Inhibitors     cough   Dexamethasone Rash    Home Medications    Prior to Admission medications   Medication Sig Start Date End Date Taking? Authorizing Provider  allopurinol (ZYLOPRIM) 100 MG tablet TAKE 1 TABLET BY MOUTH EVERY DAY 07/12/22   Willow Ora, MD  carvedilol (COREG) 3.125 MG tablet Take 1 tablet (3.125 mg total) by mouth 2 (two) times daily. 12/02/22   Meriam Sprague, MD  cholecalciferol (VITAMIN D) 1000 UNITS tablet Take 1,000 Units by mouth daily.    [provider]  dapagliflozin propanediol (FARXIGA) 10 MG TABS tablet Take 1 tablet (10 mg total) by mouth daily before breakfast. 03/17/21   Alver Sorrow, NP  Exenatide ER (BYDUREON BCISE) 2 MG/0.85ML AUIJ Inject 2 mg into the skin once a week. 01/11/23   Willow Ora, MD  famotidine (PEPCID) 20 MG tablet Take 20 mg by mouth at bedtime. 05/18/22   [provider]  fluticasone (FLONASE) 50 MCG/ACT nasal spray SPRAY 1 SPRAY INTO BOTH NOSTRILS DAILY. 01/18/23  Willow Ora, MD  furosemide (LASIX) 20 MG tablet Take 1 tablet (20 mg total) by mouth as directed. Monday,wed, Friday and sat only. 02/27/23   Meriam Sprague, MD  insulin glargine (LANTUS SOLOSTAR) 100 UNIT/ML Solostar Pen Inject 20 Units into the skin at bedtime. 10/05/22   Willow Ora, MD  Insulin Pen Needle (ASSURE ID SAFETY PEN NEEDLES) 30G X 5 MM MISC USE AS DIRECTED 10/18/22   Willow Ora, MD  Magnesium Oxide 400 MG CAPS Take 1 capsule (400 mg total) by mouth daily. 10/16/20   Meriam Sprague, MD  metFORMIN (GLUCOPHAGE) 500 MG tablet Take 1 tablet (500 mg total) by mouth 2 (two) times daily with a meal. 04/05/22   Willow Ora, MD  montelukast (SINGULAIR) 10 MG tablet TAKE 1 TABLET BY MOUTH EVERY DAY 01/04/23   Willow Ora, MD  Multiple Vitamin (MULTIVITAMIN WITH MINERALS) TABS tablet Take 1 tablet by mouth daily.    [provider]  omeprazole (PRILOSEC) 40 MG capsule TAKE 1 CAPSULE (40 MG TOTAL) BY MOUTH DAILY. 01/30/23   Willow Ora, MD  pregabalin (LYRICA) 100 MG capsule Take 1 capsule (100 mg total) by mouth 2 (two) times daily. 10/20/22   Willow Ora, MD  rOPINIRole (REQUIP) 3 MG tablet TAKE 1 TABLET BY MOUTH AT BEDTIME. 07/12/22   Willow Ora, MD  rosuvastatin (CRESTOR) 20 MG tablet Take 1 tablet (20 mg total) by mouth daily. 07/11/22 07/06/23  Meriam Sprague, MD  spironolactone (ALDACTONE) 25 MG tablet Take 0.5 tablets (12.5 mg total) by mouth daily. 09/26/22   Meriam Sprague, MD    Physical Exam    Vital Signs:  ALLANNA ARROLIGA does not have vital signs available for review today.  Given telephonic nature of communication, physical exam is limited. AAOx3. NAD. Normal affect.  Speech and respirations are unlabored.  Accessory Clinical Findings    None  Assessment & Plan    Primary Cardiologist: Meriam Sprague, MD  Preoperative cardiovascular risk assessment. Left total knee arthroplasty by Dr. Lequita Halt on 04/24/2023.  Chart reviewed as part of pre-operative protocol coverage. According to the RCRI, patient has a 6.6% risk of MACE. Patient reports activity equivalent to >4.0 METS (painting home and performing yard work).   Given past medical history and time since last visit, based on ACC/AHA guidelines, [name] would be at acceptable risk for the planned procedure without further cardiovascular testing.   Patient was advised that if she develops new symptoms prior to surgery to contact our office to arrange a follow-up appointment.  she verbalized understanding.   I will route this recommendation to the requesting party via Epic fax function.  Please call with questions.  Time:   Today, I have spent 5 minutes with the patient with telehealth technology discussing medical history, symptoms, and management plan.     Carlos Levering, NP  03/26/2023, 4:29  PM

## 2023-03-27 ENCOUNTER — Ambulatory Visit: Payer: Medicare Other | Attending: Cardiology | Admitting: Student

## 2023-03-27 DIAGNOSIS — Z01810 Encounter for preprocedural cardiovascular examination: Secondary | ICD-10-CM

## 2023-04-04 NOTE — H&P (Signed)
TOTAL KNEE ADMISSION H&P  Patient is being admitted for left total knee arthroplasty.  Subjective:  Chief Complaint: Left knee pain.  HPI: [name], 77 y.o. female has a history of pain and functional disability in the left knee due to arthritis and has failed non-surgical conservative treatments for greater than 12 weeks to include NSAID's and/or analgesics, use of assistive devices, and activity modification. Onset of symptoms was gradual, starting several years ago with gradually worsening course since that time. The patient noted no past surgery on the left knee.  Patient currently rates pain in the left knee at 8 out of 10 with activity. Patient has night pain, worsening of pain with activity and weight bearing, and pain that interferes with activities of daily living. Patient has evidence of  near bone on bone lateral and patellofemoral with slight valgus deformity  by imaging studies. There is no active infection.  Patient Active Problem List   Diagnosis Date Noted   Screening for osteoporosis 10/04/2022   Stage 3a chronic kidney disease (HCC) 10/04/2022   Vocal fold paresis, left 04/21/2022   Laryngospasms 04/21/2022   Muscle tension dysphonia 04/21/2022   Old tear of meniscus of left knee 09/28/2021   Combined hyperlipidemia associated with type 2 diabetes mellitus (HCC) 09/28/2021   Gout 07/19/2021   S/P shoulder replacement, right 12/14/2020   Rotator cuff tear arthropathy 10/22/2020   Nonischemic cardiomyopathy (HCC) 10/06/2020   Congestive heart failure with left ventricular systolic dysfunction (HCC) 08/24/2020   Primary osteoarthritis of left shoulder 07/29/2020   DDD (degenerative disc disease), cervical 04/30/2020   Osteoarthritis of left knee 11/05/2018   Chronic pansinusitis 10/04/2017   Chronic low back pain 09/05/2017   Degenerative lumbar disc 06/04/2015   S/P lumbar laminectomy 06/04/2015   Restless leg syndrome 05/13/2014   Type 2 diabetes mellitus with  peripheral neuropathy (HCC) 04/09/2013   Spinal stenosis of lumbar region without neurogenic claudication 09/12/2012   GERD (gastroesophageal reflux disease) 05/25/2011   Hemorrhoids, internal, with bleeding 05/25/2011   Hypertension associated with diabetes (HCC) 11/08/2010   Chronic allergic rhinitis 08/19/2010   Mitral valve prolapse 10/10/2008    Past Medical History:  Diagnosis Date   Arthritis    Chronic back pain    buldging disc,scoliosis,arthritis   Congestive heart failure with left ventricular systolic dysfunction (HCC) 08/24/2020   Diabetes mellitus without complication (HCC)    takes Trulicity,Jardiance,and Metformin daily.Average fasting blood sugar runs around130   GERD (gastroesophageal reflux disease)    takes Omeprazole daily   Gout 07/19/2021   On allopurinol   History of bronchitis > 8 yrs ago   History of shingles    HTN (hypertension)    takes Amlodipine and Micardis daily   Hx of colonic polyps    benign   Internal and external hemorrhoids without complication    Joint pain    Mitral valve prolapse    Nocturia    PONV (postoperative nausea and vomiting)    when gets injections in joints gets hives.Betadine rash   Restless leg syndrome    takes Requip at bedtime   Seasonal allergies    takes Claritin daily as needed   Urinary frequency    Uterine fibroid     Past Surgical History:  Procedure Laterality Date   BUNIONECTOMY Bilateral    COLONOSCOPY  07/12/2004   diverticulosis, internal and external hemorrhoids   COLONOSCOPY     ETHMOIDECTOMY Bilateral 08/15/2022   Procedure: ETHMOIDECTOMY WITH TISSUE REMOVAL;  Surgeon: Suszanne Conners,  Su, MD;  Location: [facility];  Service: ENT;  Laterality: Bilateral;   FINGER ARTHROSCOPY WITH CARPOMETACARPEL (CMC) ARTHROPLASTY Right 09/28/2015   Procedure: RIGHT THUMB TRAPEZIUM EXCISION WITH CARPOMETACARPEL (CMC) ARTHROPLASTY AND TENDON TRANSFER;  Surgeon: Bradly Bienenstock, MD;  Location: [facility];  Service:  Orthopedics;  Laterality: Right;   FRONTAL SINUS EXPLORATION Bilateral 08/15/2022   Procedure: FRONTAL RECESS SINUS EXPLORATION WITH NAVIGATION;  Surgeon: Newman Pies, MD;  Location: [facility];  Service: ENT;  Laterality: Bilateral;   HEMORRHOID SURGERY     almost 40 yrs ago   HYSTEROSCOPY WITH D & C  08/23/2000   and resectoscopic myomectomy   LUMBAR DISC SURGERY  03/16/2005   LUMBAR EPIDURAL INJECTION     MAXILLARY ANTROSTOMY Bilateral 08/15/2022   Procedure: ENDOSCOPIC MAXILLARY ANTROSTOMY WITH TISSUE REMOVAL;  Surgeon: Newman Pies, MD;  Location: [facility];  Service: ENT;  Laterality: Bilateral;   PLANTAR FASCIA RELEASE Left 02/03/2010   and torn tendon   REVERSE SHOULDER ARTHROPLASTY Right 12/14/2020   Procedure: REVERSE SHOULDER ARTHROPLASTY;  Surgeon: Beverely Low, MD;  Location: [facility];  Service: Orthopedics;  Laterality: Right;   RIGHT/LEFT HEART CATH AND CORONARY ANGIOGRAPHY N/A 09/07/2020   Procedure: RIGHT/LEFT HEART CATH AND CORONARY ANGIOGRAPHY;  Surgeon: Yvonne Kendall, MD;  Location: [facility];  Service: Cardiovascular;  Laterality: N/A;   SINUS ENDO W/FUSION Bilateral 08/15/2022   Procedure: SPHENOIDECTOMY WITH TISSUE REMOVAL WITH NAVIGATION;  Surgeon: Newman Pies, MD;  Location: [facility];  Service: ENT;  Laterality: Bilateral;   TENDON TRANSFER Right 09/28/2015   Procedure: TENDON TRANSFER;  Surgeon: Bradly Bienenstock, MD;  Location: [facility];  Service: Orthopedics;  Laterality: Right;   TONSILLECTOMY     TUBAL LIGATION     TURBINATE REDUCTION Bilateral 08/15/2022   Procedure: TURBINATE REDUCTION;  Surgeon: Newman Pies, MD;  Location:  SURGERY CENTER;  Service: ENT;  Laterality: Bilateral;   WRIST SURGERY     left, removal of cyst    Prior to Admission medications   Medication Sig Start Date End Date Taking? Authorizing Provider  allopurinol (ZYLOPRIM) 100 MG tablet TAKE 1 TABLET BY MOUTH EVERY DAY 07/12/22  Yes Willow Ora,  MD  Calcium-Magnesium-Zinc (CAL-MAG-ZINC PO) Take 1 tablet by mouth in the morning.   Yes [provider]  Carboxymeth-Glyc-Polysorb PF (REFRESH OPTIVE MEGA-3) 0.5-1-0.5 % SOLN Place 1-2 drops into both eyes 3 (three) times daily as needed (dry/irritated eyes.).   Yes [provider]  carvedilol (COREG) 3.125 MG tablet Take 1 tablet (3.125 mg total) by mouth 2 (two) times daily. 12/02/22  Yes Meriam Sprague, MD  cholecalciferol (VITAMIN D) 1000 UNITS tablet Take 1,000 Units by mouth in the morning.   Yes [provider]  Cyanocobalamin (VITAMIN B-12 PO) Take 1 tablet by mouth in the morning.   Yes [provider]  dapagliflozin propanediol (FARXIGA) 10 MG TABS tablet Take 1 tablet (10 mg total) by mouth daily before breakfast. 03/17/21  Yes Alver Sorrow, NP  famotidine (PEPCID) 20 MG tablet Take 20 mg by mouth at bedtime. 05/18/22  Yes [provider]  fluticasone (FLONASE) 50 MCG/ACT nasal spray SPRAY 1 SPRAY INTO BOTH NOSTRILS DAILY. 01/18/23  Yes Willow Ora, MD  furosemide (LASIX) 20 MG tablet Take 1 tablet (20 mg total) by mouth as directed. Monday,wed, Friday and sat only. 02/27/23  Yes Meriam Sprague, MD  ibuprofen (ADVIL) 200 MG tablet Take 600-800 mg by  mouth daily as needed (pain).   Yes [provider]  insulin glargine (LANTUS SOLOSTAR) 100 UNIT/ML Solostar Pen Inject 20 Units into the skin at bedtime. 10/05/22  Yes Willow Ora, MD  metFORMIN (GLUCOPHAGE) 500 MG tablet Take 1 tablet (500 mg total) by mouth 2 (two) times daily with a meal. 04/05/22  Yes Willow Ora, MD  montelukast (SINGULAIR) 10 MG tablet TAKE 1 TABLET BY MOUTH EVERY DAY 01/04/23  Yes Willow Ora, MD  Multiple Vitamin (MULTIVITAMIN WITH MINERALS) TABS tablet Take 1 tablet by mouth in the morning.   Yes [provider]  omeprazole (PRILOSEC) 40 MG capsule TAKE 1 CAPSULE (40 MG TOTAL) BY MOUTH DAILY. 01/30/23  Yes Willow Ora, MD   pregabalin (LYRICA) 100 MG capsule Take 1 capsule (100 mg total) by mouth 2 (two) times daily. 10/20/22  Yes Willow Ora, MD  rOPINIRole (REQUIP) 3 MG tablet TAKE 1 TABLET BY MOUTH AT BEDTIME. 07/12/22  Yes Willow Ora, MD  rosuvastatin (CRESTOR) 20 MG tablet Take 1 tablet (20 mg total) by mouth daily. Patient taking differently: Take 20 mg by mouth every evening. 07/11/22 07/06/23 Yes Pemberton, Kathlynn Grate, MD  sacubitril-valsartan (ENTRESTO) 49-51 MG Take 1 tablet by mouth 2 (two) times daily.   Yes [provider]  spironolactone (ALDACTONE) 25 MG tablet Take 0.5 tablets (12.5 mg total) by mouth daily. 09/26/22  Yes Meriam Sprague, MD  Insulin Pen Needle (ASSURE ID SAFETY PEN NEEDLES) 30G X 5 MM MISC USE AS DIRECTED 10/18/22   Willow Ora, MD    Allergies  Allergen Reactions   Amlodipine Swelling   Amoxicillin-Pot Clavulanate Hives   Depo-Medrol [Methylprednisolone] Hives   Gabapentin Rash    hives    Levothyroxine Other (See Comments)    severe muscle pain in neck     Sitagliptin Other (See Comments)    Urinary hesitancy Januvia   Amoxicillin Hives   Betadine [Povidone Iodine]     rash   Codeine Nausea And Vomiting   Corticosteroids Hives    Steroid injections   Tetracyclines & Related Nausea And Vomiting   Ace Inhibitors     cough   Dexamethasone Rash    Social History   Socioeconomic History   Marital status: Married    Spouse name: Not on file   Number of children: 3   Years of education: Not on file   Highest education level: Associate degree: occupational, Scientist, product/process development, or vocational program  Occupational History   Occupation: Retired   Tobacco Use   Smoking status: Never   Smokeless tobacco: Never  Vaping Use   Vaping status: Never Used  Substance and Sexual Activity   Alcohol use: No    Alcohol/week: 0.0 standard drinks of alcohol   Drug use: No   Sexual activity: Yes  Other Topics Concern   Not on file  Social History  Narrative   1 caffeine drinks daily         Social Determinants of Health   Financial Resource Strain: Low Risk  (01/04/2023)   Overall Financial Resource Strain (CARDIA)    Difficulty of Paying Living Expenses: Not hard at all  Food Insecurity: No Food Insecurity (01/04/2023)   Hunger Vital Sign    Worried About Running Out of Food in the Last Year: Never true    Ran Out of Food in the Last Year: Never true  Transportation Needs: No Transportation Needs (01/04/2023)   PRAPARE - Transportation  Lack of Transportation (Medical): No    Lack of Transportation (Non-Medical): No  Physical Activity: Inactive (01/04/2023)   Exercise Vital Sign    Days of Exercise per Week: 0 days    Minutes of Exercise per Session: 30 min  Stress: No Stress Concern Present (01/04/2023)   [employer] of Occupational Health - Occupational Stress Questionnaire    Feeling of Stress : Not at all  Social Connections: Moderately Integrated (01/04/2023)   Social Connection and Isolation Panel [NHANES]    Frequency of Communication with Friends and Family: Once a week    Frequency of Social Gatherings with Friends and Family: Once a week    Attends Religious Services: More than 4 times per year    Active Member of [employer] or Organizations: Yes    Attends Engineer, structural: More than 4 times per year    Marital Status: Married  Catering manager Violence: Not At Risk (05/06/2022)   Humiliation, Afraid, Rape, and Kick questionnaire    Fear of Current or Ex-Partner: No    Emotionally Abused: No    Physically Abused: No    Sexually Abused: No    Tobacco Use: Low Risk  (01/06/2023)   Patient History    Smoking Tobacco Use: Never    Smokeless Tobacco Use: Never    Passive Exposure: Not on file   Social History   Substance and Sexual Activity  Alcohol Use No   Alcohol/week: 0.0 standard drinks of alcohol    Family History  Problem Relation Age of Onset   Pancreatic cancer Father    Colon cancer  Neg Hx    Breast cancer Neg Hx     ROS  Objective:  Physical Exam: Well nourished and well developed.  General: Alert and oriented x3, cooperative and pleasant, no acute distress.  Head: normocephalic, atraumatic, neck supple.  Eyes: EOMI. Abdomen: non-tender to palpation and soft, normoactive bowel sounds. Musculoskeletal: Left knee  No effusion with slight valgus deformity  Range 0-125 with crepitus  Tender lateral greater than medial with no instability  Calves soft and nontender. Motor function intact in LE. Strength 5/5 LE bilaterally. Neuro: Distal pulses 2+. Sensation to light touch intact in LE.  Vital signs in last 24 hours: BP: ()/()  Arterial Line BP: ()/()   Imaging Review Plain radiographs demonstrate moderate degenerative joint disease of the left knee. The overall alignment is mild valgus. The bone quality appears to be adequate for age and reported activity level.  Assessment/Plan:  End stage arthritis, left knee   The patient history, physical examination, clinical judgment of the provider and imaging studies are consistent with end stage degenerative joint disease of the left knee and total knee arthroplasty is deemed medically necessary. The treatment options including medical management, injection therapy arthroscopy and arthroplasty were discussed at length. The risks and benefits of total knee arthroplasty were presented and reviewed. The risks due to aseptic loosening, infection, stiffness, patella tracking problems, thromboembolic complications and other imponderables were discussed. The patient acknowledged the explanation, agreed to proceed with the plan and consent was signed. Patient is being admitted for inpatient treatment for surgery, pain control, PT, OT, prophylactic antibiotics, VTE prophylaxis, progressive ambulation and ADLs and discharge planning. The patient is planning to be discharged  home .  Patient's anticipated LOS is less than 2  midnights, meeting these requirements: - Lives within 1 hour of care - Has a competent adult at home to recover with post-op  - NO history of  -  Chronic pain requiring opiods  - Coronary Artery Disease  - Heart failure  - Heart attack  - Stroke  - DVT/VTE  - Cardiac arrhythmia  - Respiratory Failure/COPD  - Renal failure  - Anemia  - Advanced Liver disease    Therapy Plans: [facility] Disposition: Home with Husband Planned DVT Prophylaxis: Aspirin 81 mg BID DME Needed: RW PCP: Asencion Partridge, MD (clearance received) Cardiologist: Laurance Flatten, MD (clearance received) TXA: IV Allergies: amoxicillin (hives), codeine (N/V), cortisone (hives), depo-medrol (hives), tetracycline Anesthesia Concerns: N/V BMI: 31.6 Last HgbA1c: 7.5% (09/2022 - will recheck with pre-op labs)  Pharmacy: [facility] [place])  Other: -does not tolerate oxycodone - discussed dilaudid post-op  - Patient was instructed on what medications to stop prior to surgery. - Follow-up visit in 2 weeks with Dr. Lequita Halt - Begin physical therapy following surgery - Pre-operative lab work as pre-surgical testing - Prescriptions will be provided in hospital at time of discharge  R. Arcola Jansky, PA-C Orthopedic Surgery [facility] [place]

## 2023-04-12 ENCOUNTER — Other Ambulatory Visit: Payer: Self-pay | Admitting: Family Medicine

## 2023-04-12 NOTE — Progress Notes (Addendum)
COVID Vaccine received:  []  No [x]  Yes Date of any COVID positive Test in last 90 days:  PCP - Asencion Partridge, MD medical clearance in 01-06-23 note and on chart Cardiologist - Laurance Flatten, MD   Carlos Levering, NP  cardiac clearance in 03-27-23  note  Chest x-ray - 08-16-2020  1v  Epic EKG - 06-09-2022  Epic  Stress Test -  ECHO - 01-10-2022  Epic Cardiac Cath - 09-07-2020  R/LHC by Dr. Cristal Deer End  PCR screen: [x]  Ordered & Completed           []   No Order but Needs PROFEND           []   N/A for this surgery  Surgery Plan:  []  Ambulatory   [x]  Outpatient in bed  []  Admit  Anesthesia:    []  General  []  Spinal  [x]   Choice []   MAC  Pacemaker / ICD device [x]  No []  Yes   Spinal Cord Stimulator:[x]  No []  Yes       History of Sleep Apnea? [x]  No []  Yes   CPAP used?- [x]  No []  Yes    Does the patient monitor blood sugar?  []  No []  Yes  []  N/A  Patient has: []  NO Hx DM   []  Pre-DM   []  DM1  [x]   DM2 Last A1c was:  6.6 on   01-06-2023   Does patient have a [employer] or Dexacom? []  No []  Yes   Fasting Blood Sugar Ranges-  Checks Blood Sugar _____ times a day  SGLT-2 inhibitors / usual dose - dapagliglozin Marcelline Deist), 10mg  q am  SGLT-2 instructions: Hold x 72 hours.   Diabetic medications/ instructions: Insulin glargine (Lantus)  20 units q hs.   Day before surgery: take 50% or 10 units that night.  Day of surgery:  Take 50% or 10 units.   Metformin 500mg  bid:  Day before surgery, take as usual. Day of Surgery:  DO NOT TAKE METFORMIN.   Blood Thinner / Instructions:None Aspirin Instructions:  None  ERAS Protocol Ordered: []  No  [x]  Yes PRE-SURGERY []  ENSURE  [x]  G2   []  No Drink Ordered Patient is to be NPO after: 05:15 am  Comments: Patient was given the 5 CHG shower / bath instructions for TKA surgery along with 2 bottles of the CHG soap. Patient will start this on:  Thursday  04-20-23          All questions were asked and answered, Patient voiced understanding of this  process.   Activity level: Patient is able / unable to climb a flight of stairs without difficulty; []  No CP  []  No SOB, but would have ___   Patient can / can not perform ADLs without assistance.   Anesthesia review: DM2, HTN, GERD, CHF- NICM, MVP, PVCs, restless legs, CKD3a, PONV   Patient denies shortness of breath, fever, cough and chest pain at PAT appointment.  Patient verbalized understanding and agreement to the Pre-Surgical Instructions that were given to them at this PAT appointment. Patient was also educated of the need to review these PAT instructions again prior to her surgery.I reviewed the appropriate phone numbers to call if they have any and questions or concerns.

## 2023-04-12 NOTE — Patient Instructions (Signed)
SURGICAL WAITING ROOM VISITATION Patients having surgery or a procedure may have no more than 2 support people in the waiting area - these visitors may rotate in the visitor waiting room.   Due to an increase in RSV and influenza rates and associated hospitalizations, children ages 104 and under may not visit patients in [facility] hospitals. If the patient needs to stay at the hospital during part of their recovery, the visitor guidelines for inpatient rooms apply.  PRE-OP VISITATION  Pre-op nurse will coordinate an appropriate time for 1 support person to accompany the patient in pre-op.  This support person may not rotate.  This visitor will be contacted when the time is appropriate for the visitor to come back in the pre-op area.  Please refer to the [facility] website for the visitor guidelines for Inpatients (after your surgery is over and you are in a regular room).  You are not required to quarantine at this time prior to your surgery. However, you must do this: Hand Hygiene often Do NOT share personal items Notify your provider if you are in close contact with someone who has COVID or you develop fever 100.4 or greater, new onset of sneezing, cough, sore throat, shortness of breath or body aches.  If you test positive for Covid or have been in contact with anyone that has tested positive in the last 10 days please notify you surgeon.    Your procedure is scheduled on:  Monday April 24, 2023  Report to [facility] Main Entrance: [place] entrance where the [employer] is available.   Report to admitting at:   05:45  AM  Call this number if you have any questions or problems the morning of surgery [phone]  Do not eat food after Midnight the night prior to your surgery/procedure.  After Midnight you may have the following liquids until  05:15  AM DAY OF SURGERY  Clear Liquid Diet Water Black Coffee (sugar ok, NO MILK/CREAM OR CREAMERS)  Tea (sugar ok, NO  MILK/CREAM OR CREAMERS) regular and decaf                             Plain Jell-O  with no fruit (NO RED)                                           Fruit ices (not with fruit pulp, NO RED)                                     Popsicles (NO RED)                                                                  Juice: NO CITRUS JUICES: only apple, WHITE grape, WHITE cranberry Sports drinks like Gatorade or Powerade (NO RED)                   The day of surgery:  Drink ONE (1) Pre-Surgery G2 at 05:15    AM the morning of surgery.  Drink in one sitting. Do not sip. This drink was given to you during your hospital pre-op appointment visit. Nothing else to drink after completing the Pre-Surgery G2 : No candy, chewing gum or throat lozenges.    FOLLOW  ANY ADDITIONAL PRE OP INSTRUCTIONS YOU RECEIVED FROM YOUR SURGEON'S OFFICE!!!   Oral Hygiene is also important to reduce your risk of infection.        Remember - BRUSH YOUR TEETH THE MORNING OF SURGERY WITH YOUR REGULAR TOOTHPASTE  Do NOT smoke after Midnight the night before surgery.   Diabetic Medications:   GLT-2 inhibitors / usual dose - dapagliglozin Marcelline Deist), 10mg  q am  SGLT-2 instructions: Hold x 72 hours.   Diabetic medications/ instructions: Insulin glargine (Lantus)  20 units q hs.   Day before surgery: take 50% or 10 units that night.  Day of surgery:  Take 50% or 10 units.   Metformin 500mg  bid:  Day BEFORE surgery, take as usual. Day of Surgery:  DO NOT TAKE METFORMIN.   STOP TAKING all Vitamins, Herbs and supplements 1 week before your surgery.   Take ONLY these medicines the morning of surgery with A SIP OF WATER: omeprazole (Prilosec), allopurinol, carvedilol (Coreg), pregabalin (Lyrica), montelukast (Singulair).  You may use your Flonase nasal spray and your Eye drops if needed.    IYou may not have any metal on your body including hair pins, jewelry, and body piercing  Do not wear make-up, lotions, powders, perfumes   or deodorant  Do not wear nail polish including gel and S&S, artificial / acrylic nails, or any other type of covering on natural nails including finger and toenails. If you have artificial nails, gel coating, etc., that needs to be removed by a nail salon, Please have this removed prior to surgery. Not doing so may mean that your surgery could be cancelled or delayed if the Surgeon or anesthesia staff feels like they are unable to monitor you safely.   Do not shave 48 hours prior to surgery to avoid nicks in your skin which may contribute to postoperative infections.   Contacts, Hearing Aids, dentures or bridgework may not be worn into surgery. DENTURES WILL BE REMOVED PRIOR TO SURGERY PLEASE DO NOT APPLY "Poly grip" OR ADHESIVES!!!  You may bring a small overnight bag with you on the day of surgery, only pack items that are not valuable. [facility] IS NOT RESPONSIBLE   FOR VALUABLES THAT ARE LOST OR STOLEN.    Do not bring your home medications to the hospital. The Pharmacy will dispense medications listed on your medication list to you during your admission in the Hospital.  Please read over the following fact sheets you were given: IF YOU HAVE QUESTIONS ABOUT YOUR PRE-OP INSTRUCTIONS, PLEASE CALL [phone]   Joyce Gross     Pre-operative 5 CHG Bath Instructions   You can play a key role in reducing the risk of infection after surgery. Your skin needs to be as free of germs as possible. You can reduce the number of germs on your skin by washing with CHG (chlorhexidine gluconate) soap before surgery. CHG is an antiseptic soap that kills germs and continues to kill germs even after washing.   DO NOT use if you have an allergy to chlorhexidine/CHG or antibacterial soaps. If your skin becomes reddened or irritated, stop using the CHG and notify one of our RNs at [phone]  Please shower with the CHG soap starting 4 days before surgery using the following schedule: START  SHOWERS ON    THURSDAY   April 20, 2023                                                                                                                                                                                      Please keep in mind the following:  DO NOT shave, including legs and underarms, starting the day of your first shower.   You may shave your face at any point before/day of surgery.   Place clean sheets on your bed the day you start using CHG soap. Use a clean washcloth (not used since being washed) for each shower. DO NOT sleep with pets once you start using the CHG.   CHG Shower Instructions:  If you choose to wash your hair and private area, wash first with your normal shampoo/soap.  After you use shampoo/soap, rinse your hair and body thoroughly to remove shampoo/soap residue.  Turn the water OFF and apply about 3 tablespoons (45 ml) of CHG soap to a CLEAN washcloth.  Apply CHG soap ONLY FROM YOUR NECK DOWN TO YOUR TOES (washing for 3-5 minutes)  DO NOT use CHG soap on face, private areas, open wounds, or sores.  Pay special attention to the area where your surgery is being performed.  If you are having back surgery, having someone wash your back for you may be helpful.  Wait 2 minutes after CHG soap is applied, then you may rinse off the CHG soap.  Pat dry with a clean towel  Put on clean clothes/pajamas   If you choose to wear lotion, please use ONLY the CHG-compatible lotions on the back of this paper.     Additional instructions for the day of surgery: DO NOT APPLY any lotions, deodorants, cologne, or perfumes.   Put on clean/comfortable clothes.  Brush your teeth.  Ask your nurse before applying any prescription medications to the skin.      CHG Compatible Lotions   Aveeno Moisturizing lotion  Cetaphil Moisturizing Cream  Cetaphil Moisturizing Lotion  Clairol Herbal Essence Moisturizing Lotion, Dry Skin  Clairol Herbal Essence Moisturizing Lotion, Extra Dry Skin   Clairol Herbal Essence Moisturizing Lotion, Normal Skin  Curel Age Defying Therapeutic Moisturizing Lotion with Alpha Hydroxy  Curel Extreme Care Body Lotion  Curel Soothing Hands Moisturizing Hand Lotion  Curel Therapeutic Moisturizing Cream, Fragrance-Free  Curel Therapeutic Moisturizing Lotion, Fragrance-Free  Curel Therapeutic Moisturizing Lotion, Original Formula  Eucerin Daily Replenishing Lotion  Eucerin Dry Skin Therapy Plus Alpha Hydroxy Crme  Eucerin Dry Skin Therapy Plus Alpha Hydroxy Lotion  Eucerin Original Crme  Eucerin Original Lotion  Eucerin Plus Crme Eucerin Plus Lotion  Eucerin  TriLipid Replenishing Lotion  Keri Anti-Bacterial Hand Lotion  Keri Deep Conditioning Original Lotion Dry Skin Formula Softly Scented  Keri Deep Conditioning Original Lotion, Fragrance Free Sensitive Skin Formula  Keri Lotion Fast Absorbing Fragrance Free Sensitive Skin Formula  Keri Lotion Fast Absorbing Softly Scented Dry Skin Formula  Keri Original Lotion  Keri Skin Renewal Lotion Keri Silky Smooth Lotion  Keri Silky Smooth Sensitive Skin Lotion  Nivea Body Creamy Conditioning Oil  Nivea Body Extra Enriched Lotion  Nivea Body Original Lotion  Nivea Body Sheer Moisturizing Lotion Nivea Crme  Nivea Skin Firming Lotion  NutraDerm 30 Skin Lotion  NutraDerm Skin Lotion  NutraDerm Therapeutic Skin Cream  NutraDerm Therapeutic Skin Lotion  ProShield Protective Hand Cream  Provon moisturizing lotion   FAILURE TO FOLLOW THESE INSTRUCTIONS MAY RESULT IN THE CANCELLATION OF YOUR SURGERY  PATIENT SIGNATURE_________________________________  NURSE SIGNATURE__________________________________  ________________________________________________________________________      [name]    An incentive spirometer is a tool that can help keep your lungs clear and active. This tool measures how well you are filling your lungs with each breath. Taking long deep breaths may help  reverse or decrease the chance of developing breathing (pulmonary) problems (especially infection) following: A long period of time when you are unable to move or be active. BEFORE THE PROCEDURE  If the spirometer includes an indicator to show your best effort, your nurse or respiratory therapist will set it to a desired goal. If possible, sit up straight or lean slightly forward. Try not to slouch. Hold the incentive spirometer in an upright position. INSTRUCTIONS FOR USE  Sit on the edge of your bed if possible, or sit up as far as you can in bed or on a chair. Hold the incentive spirometer in an upright position. Breathe out normally. Place the mouthpiece in your mouth and seal your lips tightly around it. Breathe in slowly and as deeply as possible, raising the piston or the ball toward the top of the column. Hold your breath for 3-5 seconds or for as long as possible. Allow the piston or ball to fall to the bottom of the column. Remove the mouthpiece from your mouth and breathe out normally. Rest for a few seconds and repeat Steps 1 through 7 at least 10 times every 1-2 hours when you are awake. Take your time and take a few normal breaths between deep breaths. The spirometer may include an indicator to show your best effort. Use the indicator as a goal to work toward during each repetition. After each set of 10 deep breaths, practice coughing to be sure your lungs are clear. If you have an incision (the cut made at the time of surgery), support your incision when coughing by placing a pillow or rolled up towels firmly against it. Once you are able to get out of bed, walk around indoors and cough well. You may stop using the incentive spirometer when instructed by your caregiver.  RISKS AND COMPLICATIONS Take your time so you do not get dizzy or light-headed. If you are in pain, you may need to take or ask for pain medication before doing incentive spirometry. It is harder to take a deep  breath if you are having pain. AFTER USE Rest and breathe slowly and easily. It can be helpful to keep track of a log of your progress. Your caregiver can provide you with a simple table to help with this. If you are using the spirometer at home, follow these instructions: SEEK  MEDICAL CARE IF:  You are having difficultly using the spirometer. You have trouble using the spirometer as often as instructed. Your pain medication is not giving enough relief while using the spirometer. You develop fever of 100.5 F (38.1 C) or higher.                                                                                                    SEEK IMMEDIATE MEDICAL CARE IF:  You cough up bloody sputum that had not been present before. You develop fever of 102 F (38.9 C) or greater. You develop worsening pain at or near the incision site. MAKE SURE YOU:  Understand these instructions. Will watch your condition. Will get help right away if you are not doing well or get worse. Document Released: 11/28/2006 Document Revised: 10/10/2011 Document Reviewed: 01/29/2007 [facility] Patient Information 2014 [place], [place].

## 2023-04-13 ENCOUNTER — Encounter (HOSPITAL_COMMUNITY)
Admission: RE | Admit: 2023-04-13 | Discharge: 2023-04-13 | Disposition: A | Discharge status: Home / Self Care | Payer: Medicare Other | Source: Ambulatory Visit | Attending: Orthopedic Surgery | Admitting: Orthopedic Surgery

## 2023-04-13 ENCOUNTER — Encounter (HOSPITAL_COMMUNITY): Payer: Self-pay

## 2023-04-13 ENCOUNTER — Other Ambulatory Visit: Payer: Self-pay

## 2023-04-13 VITALS — BP 128/54 | HR 60 | Temp 97.8°F | Resp 16 | Ht 64.0 in | Wt 193.0 lb

## 2023-04-13 DIAGNOSIS — Z794 Long term (current) use of insulin: Secondary | ICD-10-CM | POA: Diagnosis not present

## 2023-04-13 DIAGNOSIS — E119 Type 2 diabetes mellitus without complications: Secondary | ICD-10-CM

## 2023-04-13 DIAGNOSIS — I152 Hypertension secondary to endocrine disorders: Secondary | ICD-10-CM

## 2023-04-13 DIAGNOSIS — E1159 Type 2 diabetes mellitus with other circulatory complications: Secondary | ICD-10-CM | POA: Diagnosis not present

## 2023-04-13 DIAGNOSIS — Z01818 Encounter for other preprocedural examination: Secondary | ICD-10-CM | POA: Diagnosis present

## 2023-04-13 DIAGNOSIS — I428 Other cardiomyopathies: Secondary | ICD-10-CM

## 2023-04-13 DIAGNOSIS — Z01812 Encounter for preprocedural laboratory examination: Secondary | ICD-10-CM | POA: Insufficient documentation

## 2023-04-13 HISTORY — DX: Chronic kidney disease, unspecified: N18.9

## 2023-04-13 HISTORY — DX: Anxiety disorder, unspecified: F41.9

## 2023-04-13 HISTORY — DX: Myoneural disorder, unspecified: G70.9

## 2023-04-13 HISTORY — DX: Pneumonia, unspecified organism: J18.9

## 2023-04-13 HISTORY — DX: Fibromyalgia: M79.7

## 2023-04-13 HISTORY — DX: Cardiac arrhythmia, unspecified: I49.9

## 2023-04-13 HISTORY — DX: Claustrophobia: F40.240

## 2023-04-13 LAB — BASIC METABOLIC PANEL
Anion gap: 9 (ref 5–15)
BUN: 28 mg/dL — ABNORMAL HIGH (ref 8–23)
CO2: 23 mmol/L (ref 22–32)
Calcium: 9.5 mg/dL (ref 8.9–10.3)
Chloride: 107 mmol/L (ref 98–111)
Creatinine, Ser: 1.33 mg/dL — ABNORMAL HIGH (ref 0.44–1.00)
GFR, Estimated: 41 mL/min — ABNORMAL LOW (ref >60)
Glucose, Bld: 110 mg/dL — ABNORMAL HIGH (ref 70–99)
Potassium: 4.4 mmol/L (ref 3.5–5.1)
Sodium: 139 mmol/L (ref 135–145)

## 2023-04-13 LAB — CBC
HCT: 36.7 % (ref 36.0–46.0)
Hemoglobin: 11.5 g/dL — ABNORMAL LOW (ref 12.0–15.0)
MCH: 27.6 pg (ref 26.0–34.0)
MCHC: 31.3 g/dL (ref 30.0–36.0)
MCV: 88.2 fL (ref 80.0–100.0)
Platelets: 248 10*3/uL (ref 150–400)
RBC: 4.16 MIL/uL (ref 3.87–5.11)
RDW: 15.5 % (ref 11.5–15.5)
WBC: 6.5 10*3/uL (ref 4.0–10.5)
nRBC: 0 % (ref 0.0–0.2)

## 2023-04-13 LAB — SURGICAL PCR SCREEN
MRSA, PCR: NEGATIVE
Staphylococcus aureus: NEGATIVE

## 2023-04-13 LAB — GLUCOSE, CAPILLARY: Glucose-Capillary: 118 mg/dL — ABNORMAL HIGH (ref 70–99)

## 2023-04-14 LAB — HEMOGLOBIN A1C
Hgb A1c MFr Bld: 7.7 % — ABNORMAL HIGH (ref 4.8–5.6)
Mean Plasma Glucose: 174 mg/dL

## 2023-04-17 NOTE — Anesthesia Preprocedure Evaluation (Addendum)
Anesthesia Evaluation  Patient identified by MRN, date of birth, ID band Patient awake    Reviewed: Allergy & Precautions, NPO status , Patient's Chart, lab work & pertinent test results, reviewed documented beta blocker date and time   History of Anesthesia Complications Negative for: history of anesthetic complications  Airway Mallampati: III  TM Distance: >3 FB Neck ROM: Full    Dental  (+) Teeth Intact, Dental Advisory Given   Pulmonary neg pulmonary ROS   Pulmonary exam normal breath sounds clear to auscultation       Cardiovascular hypertension (153/61 preop, normally 120-130s SBP per pt), Pt. on medications and Pt. on home beta blockers +CHF (LVEF 45-50%, grade 1 diastolic dysfunction)  Normal cardiovascular exam Rhythm:Regular Rate:Normal  Echo 2023 1. Left ventricular ejection fraction, by estimation, is 45 to 50%. The  left ventricle has mildly decreased function. The left ventricle  demonstrates global hypokinesis. Left ventricular diastolic parameters are  consistent with Grade I diastolic  dysfunction (impaired relaxation).   2. Right ventricular systolic function is normal. The right ventricular  size is normal. Tricuspid regurgitation signal is inadequate for assessing  PA pressure.   3. The mitral valve is grossly normal. Trivial mitral valve  regurgitation. No evidence of mitral stenosis.   4. The aortic valve is tricuspid. Aortic valve regurgitation is not  visualized. No aortic stenosis is present.   5. The inferior vena cava is normal in size with greater than 50%  respiratory variability, suggesting right atrial pressure of 3 mmHg.     Neuro/Psych  PSYCHIATRIC DISORDERS Anxiety     negative neurological ROS     GI/Hepatic Neg liver ROS,GERD  Medicated and Controlled,,  Endo/Other  diabetes, Well Controlled, Type 2, Oral Hypoglycemic Agents, Insulin Dependent  Obesity BMI 33 A1c 7.7 Took 10 units  lantus this AM, FS 106 preop   Renal/GU Renal InsufficiencyRenal diseaseCKD 3, Cr 1.33  negative genitourinary   Musculoskeletal  (+) Arthritis , Osteoarthritis,  Fibromyalgia -  Abdominal  (+) + obese  Peds  Hematology negative hematology ROS (+) Hb 11.5, plt 248   Anesthesia Other Findings   Reproductive/Obstetrics negative OB ROS                             Anesthesia Physical Anesthesia Plan  ASA: 3  Anesthesia Plan: Spinal, Regional and MAC   Post-op Pain Management: Regional block* and Tylenol PO (pre-op)*   Induction:   PONV Risk Score and Plan: 2 and Propofol infusion and TIVA  Airway Management Planned: Natural Airway and Nasal Cannula  Additional Equipment: None  Intra-op Plan:   Post-operative Plan:   Informed Consent: I have reviewed the patients History and Physical, chart, labs and discussed the procedure including the risks, benefits and alternatives for the proposed anesthesia with the patient or authorized representative who has indicated his/her understanding and acceptance.       Plan Discussed with: CRNA  Anesthesia Plan Comments:        Anesthesia Quick Evaluation

## 2023-04-17 NOTE — Progress Notes (Signed)
Anesthesia Chart Review    Case: [id number] Date/Time: 04/24/23 0805   Procedure: TOTAL KNEE ARTHROPLASTY (Left: Knee)   Anesthesia type: Choice   Pre-op diagnosis: left knee osteoarthritis   Location: [facility] ROOM 10 / [facility]   Surgeons: Ollen Gross, MD       DISCUSSION:77 y.o. never smoker with h/o PONV, HTN, DM II, CKD Stage III, nonobstructive CAD, CHF, MVP, left knee OA scheduled for above procedure 04/24/23 with Dr. Ollen Gross.   A1C 7.7, forwarded to Dr. Lequita Halt.   Per cardiology preoperative evaluation 03/27/2023, "Chart reviewed as part of pre-operative protocol coverage. According to the RCRI, patient has a 6.6% risk of MACE. Patient reports activity equivalent to >4.0 METS (painting home and performing yard work).    Given past medical history and time since last visit, based on ACC/AHA guidelines, VELINA RHYNE would be at acceptable risk for the planned procedure without further cardiovascular testing."  VS: BP (!) 128/54 Comment: right arm sitting  Pulse 60   Temp 36.6 C (Oral)   Resp 16   Ht 5\' 4"  (1.626 m)   Wt 87.5 kg   SpO2 99%   BMI 33.13 kg/m   PROVIDERS: Willow Ora, MD  Primary Cardiologist:  Meriam Sprague, MD  LABS: Labs reviewed: Acceptable for surgery. (all labs ordered are listed, but only abnormal results are displayed)  Labs Reviewed  HEMOGLOBIN A1C - Abnormal; Notable for the following components:      Result Value   Hgb A1c MFr Bld 7.7 (*)    All other components within normal limits  BASIC METABOLIC PANEL - Abnormal; Notable for the following components:   Glucose, Bld 110 (*)    BUN 28 (*)    Creatinine, Ser 1.33 (*)    GFR, Estimated 41 (*)    All other components within normal limits  CBC - Abnormal; Notable for the following components:   Hemoglobin 11.5 (*)    All other components within normal limits  GLUCOSE, CAPILLARY - Abnormal; Notable for the following components:   Glucose-Capillary 118 (*)    All other  components within normal limits  SURGICAL PCR SCREEN     IMAGES:   EKG:   CV: Echo 01/10/2022  1. Left ventricular ejection fraction, by estimation, is 45 to 50%. The  left ventricle has mildly decreased function. The left ventricle  demonstrates global hypokinesis. Left ventricular diastolic parameters are  consistent with Grade I diastolic  dysfunction (impaired relaxation).   2. Right ventricular systolic function is normal. The right ventricular  size is normal. Tricuspid regurgitation signal is inadequate for assessing  PA pressure.   3. The mitral valve is grossly normal. Trivial mitral valve  regurgitation. No evidence of mitral stenosis.   4. The aortic valve is tricuspid. Aortic valve regurgitation is not  visualized. No aortic stenosis is present.   5. The inferior vena cava is normal in size with greater than 50%  respiratory variability, suggesting right atrial pressure of 3 mmHg.  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) 08/24/2020   Diabetes mellitus without complication (HCC)    takes Trulicity,Jardiance,and Metformin daily.Average fasting blood sugar runs around130   Dysrhythmia    PVCs, has MVP   Fibromyalgia  GERD (gastroesophageal reflux disease)    takes Omeprazole daily   Gout 07/19/2021   On allopurinol   History of bronchitis > 8 yrs ago   History of shingles    HTN (hypertension)    takes Amlodipine and Micardis daily   Hx of colonic polyps    benign   Internal and external hemorrhoids without complication    Joint pain    Mitral valve prolapse    Neuromuscular disorder (HCC)    restless leg syndrome   Nocturia    Pneumonia    PONV (postoperative nausea and vomiting)    when gets injections in joints gets hives.Betadine rash    Restless leg syndrome    takes Requip at bedtime   Seasonal allergies    takes Claritin daily as needed   Urinary frequency    Uterine fibroid     Past Surgical History:  Procedure Laterality Date   BUNIONECTOMY Bilateral    COLONOSCOPY  07/12/2004   diverticulosis, internal and external hemorrhoids   ETHMOIDECTOMY Bilateral 08/15/2022   Procedure: ETHMOIDECTOMY WITH TISSUE REMOVAL;  Surgeon: Newman Pies, MD;  Location: [facility];  Service: ENT;  Laterality: Bilateral;   FINGER ARTHROSCOPY WITH CARPOMETACARPEL ([facility]) ARTHROPLASTY Right 09/28/2015   Procedure: RIGHT THUMB TRAPEZIUM EXCISION WITH CARPOMETACARPEL ([facility]) ARTHROPLASTY AND TENDON TRANSFER;  Surgeon: Bradly Bienenstock, MD;  Location[facility];  Service: Orthopedics;  Laterality: Right;   FRONTAL SINUS EXPLORATION Bilateral 08/15/2022   Procedure: FRONTAL RECESS SINUS EXPLORATION WITH NAVIGATION;  Surgeon: Newman Pies, MD;  Location: [facility];  Service: ENT;  Laterality: Bilateral;   HEMORRHOID SURGERY     almost 40 yrs ago   HYSTEROSCOPY WITH D & C  08/23/2000   and resectoscopic myomectomy   LUMBAR DISC SURGERY  03/16/2005   LUMBAR EPIDURAL INJECTION     MAXILLARY ANTROSTOMY Bilateral 08/15/2022   Procedure: ENDOSCOPIC MAXILLARY ANTROSTOMY WITH TISSUE REMOVAL;  Surgeon: Newman Pies, MD;  Location: [facility];  Service: ENT;  Laterality: Bilateral;   PLANTAR FASCIA RELEASE Left 02/03/2010   and torn tendon   REVERSE SHOULDER ARTHROPLASTY Right 12/14/2020   Procedure: REVERSE SHOULDER ARTHROPLASTY;  Surgeon: Beverely Low, MD;  Location[facility];  Service: Orthopedics;  Laterality: Right;   RIGHT/LEFT HEART CATH AND CORONARY ANGIOGRAPHY N/A 09/07/2020   Procedure: RIGHT/LEFT HEART CATH AND CORONARY ANGIOGRAPHY;  Surgeon: Yvonne Kendall, MD;  [facility];  Service: Cardiovascular;  Laterality: N/A;   SINUS ENDO W/FUSION Bilateral 08/15/2022   Procedure: SPHENOIDECTOMY WITH  TISSUE REMOVAL WITH NAVIGATION;  Surgeon: Newman Pies, MD;  Location: [facility];  Service: ENT;  Laterality: Bilateral;   TENDON TRANSFER Right 09/28/2015   Procedure: TENDON TRANSFER;  Surgeon: Bradly Bienenstock, MD;  [facility];  Service: Orthopedics;  Laterality: Right;   TONSILLECTOMY     TUBAL LIGATION     TURBINATE REDUCTION Bilateral 08/15/2022   Procedure: TURBINATE REDUCTION;  Surgeon: Newman Pies, MD;  Location: [facility];  Service: ENT;  Laterality: Bilateral;   WRIST SURGERY     left, removal of cyst    MEDICATIONS:  allopurinol (ZYLOPRIM) 100 MG tablet   Calcium-Magnesium-Zinc (CAL-MAG-ZINC PO)   Carboxymeth-Glyc-Polysorb PF (REFRESH OPTIVE MEGA-3) 0.5-1-0.5 % SOLN   carvedilol (COREG) 3.125 MG tablet   cholecalciferol (VITAMIN D) 1000 UNITS tablet   Cyanocobalamin (VITAMIN B-12 PO)   dapagliflozin propanediol (FARXIGA) 10 MG TABS tablet   famotidine (PEPCID) 20 MG tablet   fluticasone (FLONASE)  50 MCG/ACT nasal spray   furosemide (LASIX) 20 MG tablet   ibuprofen (ADVIL) 200 MG tablet   insulin glargine (LANTUS SOLOSTAR) 100 UNIT/ML Solostar Pen   Insulin Pen Needle (ASSURE ID SAFETY PEN NEEDLES) 30G X 5 MM MISC   metFORMIN (GLUCOPHAGE) 500 MG tablet   montelukast (SINGULAIR) 10 MG tablet   Multiple Vitamin (MULTIVITAMIN WITH MINERALS) TABS tablet   omeprazole (PRILOSEC) 40 MG capsule   pregabalin (LYRICA) 100 MG capsule   rOPINIRole (REQUIP) 3 MG tablet   rosuvastatin (CRESTOR) 20 MG tablet   sacubitril-valsartan (ENTRESTO) 49-51 MG   spironolactone (ALDACTONE) 25 MG tablet   No current facility-administered medications for this encounter.    Jodell Cipro Ward, PA-C [facility] [phone]

## 2023-04-20 ENCOUNTER — Other Ambulatory Visit: Payer: Self-pay | Admitting: Family Medicine

## 2023-04-24 ENCOUNTER — Other Ambulatory Visit: Payer: Self-pay

## 2023-04-24 ENCOUNTER — Encounter (HOSPITAL_COMMUNITY)
Admission: RE | Disposition: A | Discharge status: Home / Self Care | Payer: Self-pay | Source: Ambulatory Visit | Attending: Orthopedic Surgery

## 2023-04-24 ENCOUNTER — Ambulatory Visit (HOSPITAL_BASED_OUTPATIENT_CLINIC_OR_DEPARTMENT_OTHER): Payer: Medicare Other | Admitting: Anesthesiology

## 2023-04-24 ENCOUNTER — Ambulatory Visit (HOSPITAL_COMMUNITY): Payer: Medicare Other | Admitting: Physician Assistant

## 2023-04-24 ENCOUNTER — Observation Stay (HOSPITAL_COMMUNITY)
Admission: RE | Admit: 2023-04-24 | Discharge: 2023-04-25 | Disposition: A | Discharge status: Home / Self Care | Payer: Medicare Other | Source: Ambulatory Visit | Attending: Orthopedic Surgery | Admitting: Orthopedic Surgery

## 2023-04-24 ENCOUNTER — Encounter (HOSPITAL_COMMUNITY): Payer: Self-pay | Admitting: Orthopedic Surgery

## 2023-04-24 DIAGNOSIS — I130 Hypertensive heart and chronic kidney disease with heart failure and stage 1 through stage 4 chronic kidney disease, or unspecified chronic kidney disease: Secondary | ICD-10-CM | POA: Diagnosis not present

## 2023-04-24 DIAGNOSIS — E1122 Type 2 diabetes mellitus with diabetic chronic kidney disease: Secondary | ICD-10-CM | POA: Diagnosis not present

## 2023-04-24 DIAGNOSIS — Z7984 Long term (current) use of oral hypoglycemic drugs: Secondary | ICD-10-CM | POA: Diagnosis not present

## 2023-04-24 DIAGNOSIS — I2510 Atherosclerotic heart disease of native coronary artery without angina pectoris: Secondary | ICD-10-CM | POA: Diagnosis not present

## 2023-04-24 DIAGNOSIS — Z79899 Other long term (current) drug therapy: Secondary | ICD-10-CM | POA: Diagnosis not present

## 2023-04-24 DIAGNOSIS — Z96611 Presence of right artificial shoulder joint: Secondary | ICD-10-CM | POA: Diagnosis not present

## 2023-04-24 DIAGNOSIS — E1142 Type 2 diabetes mellitus with diabetic polyneuropathy: Principal | ICD-10-CM

## 2023-04-24 DIAGNOSIS — I10 Essential (primary) hypertension: Secondary | ICD-10-CM | POA: Diagnosis not present

## 2023-04-24 DIAGNOSIS — E1169 Type 2 diabetes mellitus with other specified complication: Secondary | ICD-10-CM

## 2023-04-24 DIAGNOSIS — I152 Hypertension secondary to endocrine disorders: Secondary | ICD-10-CM

## 2023-04-24 DIAGNOSIS — Z794 Long term (current) use of insulin: Secondary | ICD-10-CM | POA: Diagnosis not present

## 2023-04-24 DIAGNOSIS — I5020 Unspecified systolic (congestive) heart failure: Secondary | ICD-10-CM | POA: Insufficient documentation

## 2023-04-24 DIAGNOSIS — E119 Type 2 diabetes mellitus without complications: Secondary | ICD-10-CM

## 2023-04-24 DIAGNOSIS — Z01818 Encounter for other preprocedural examination: Secondary | ICD-10-CM

## 2023-04-24 DIAGNOSIS — N1831 Chronic kidney disease, stage 3a: Secondary | ICD-10-CM | POA: Diagnosis not present

## 2023-04-24 DIAGNOSIS — M1712 Unilateral primary osteoarthritis, left knee: Secondary | ICD-10-CM | POA: Diagnosis present

## 2023-04-24 DIAGNOSIS — E1159 Type 2 diabetes mellitus with other circulatory complications: Secondary | ICD-10-CM

## 2023-04-24 DIAGNOSIS — M869 Osteomyelitis, unspecified: Secondary | ICD-10-CM | POA: Diagnosis not present

## 2023-04-24 HISTORY — PX: TOTAL KNEE ARTHROPLASTY: SHX125

## 2023-04-24 LAB — GLUCOSE, CAPILLARY
Glucose-Capillary: 106 mg/dL — ABNORMAL HIGH (ref 70–99)
Glucose-Capillary: 80 mg/dL (ref 70–99)
Glucose-Capillary: 82 mg/dL (ref 70–99)
Glucose-Capillary: 95 mg/dL (ref 70–99)
Glucose-Capillary: 219 mg/dL — ABNORMAL HIGH (ref 70–99)
Glucose-Capillary: 307 mg/dL — ABNORMAL HIGH (ref 70–99)

## 2023-04-24 SURGERY — ARTHROPLASTY, KNEE, TOTAL
Anesthesia: Monitor Anesthesia Care | Site: Knee | Laterality: Left

## 2023-04-24 MED ORDER — PANTOPRAZOLE SODIUM 40 MG PO TBEC
80.0000 mg | DELAYED_RELEASE_TABLET | Freq: Every day | ORAL | Status: DC
  Administered 2023-04-25: 40 mg via ORAL
  Filled 2023-04-24: qty 2

## 2023-04-24 MED ORDER — FLUTICASONE PROPIONATE 50 MCG/ACT NA SUSP
1.0000 | Freq: Every day | NASAL | Status: DC
  Administered 2023-04-24 – 2023-04-25 (×2): 1 via NASAL
  Filled 2023-04-24: qty 16

## 2023-04-24 MED ORDER — PROPOFOL 1000 MG/100ML IV EMUL
INTRAVENOUS | Status: AC
  Filled 2023-04-24: qty 100

## 2023-04-24 MED ORDER — CEFAZOLIN SODIUM-DEXTROSE 2-4 GM/100ML-% IV SOLN
2.0000 g | INTRAVENOUS | Status: AC
  Administered 2023-04-24: 2 g via INTRAVENOUS
  Filled 2023-04-24: qty 100

## 2023-04-24 MED ORDER — DOCUSATE SODIUM 100 MG PO CAPS
100.0000 mg | ORAL_CAPSULE | Freq: Two times a day (BID) | ORAL | Status: DC
  Administered 2023-04-24 – 2023-04-25 (×3): 100 mg via ORAL
  Filled 2023-04-24 (×3): qty 1

## 2023-04-24 MED ORDER — INSULIN ASPART 100 UNIT/ML IJ SOLN: 0.0000 [IU] | INTRAMUSCULAR | Status: DC | PRN

## 2023-04-24 MED ORDER — POLYETHYLENE GLYCOL 3350 17 G PO PACK: 17.0000 g | PACK | Freq: Every day | ORAL | Status: DC | PRN

## 2023-04-24 MED ORDER — ACETAMINOPHEN 500 MG PO TABS
1000.0000 mg | ORAL_TABLET | Freq: Four times a day (QID) | ORAL | Status: AC
  Administered 2023-04-24 – 2023-04-25 (×3): 1000 mg via ORAL
  Filled 2023-04-24 (×2): qty 2

## 2023-04-24 MED ORDER — FENTANYL CITRATE PF 50 MCG/ML IJ SOSY
50.0000 ug | PREFILLED_SYRINGE | INTRAMUSCULAR | Status: DC
  Administered 2023-04-24: 50 ug via INTRAVENOUS
  Filled 2023-04-24: qty 2

## 2023-04-24 MED ORDER — BUPIVACAINE LIPOSOME 1.3 % IJ SUSP: 20.0000 mL | Freq: Once | INTRAMUSCULAR | Status: DC

## 2023-04-24 MED ORDER — SODIUM CHLORIDE 0.9 % IR SOLN
Status: DC | PRN
  Administered 2023-04-24: 1000 mL

## 2023-04-24 MED ORDER — METHOCARBAMOL 1000 MG/10ML IJ SOLN: 500.0000 mg | Freq: Four times a day (QID) | INTRAVENOUS | Status: DC | PRN

## 2023-04-24 MED ORDER — ONDANSETRON HCL 4 MG/2ML IJ SOLN
INTRAMUSCULAR | Status: DC | PRN
  Administered 2023-04-24: 4 mg via INTRAVENOUS

## 2023-04-24 MED ORDER — DIPHENHYDRAMINE HCL 12.5 MG/5ML PO ELIX
12.5000 mg | ORAL_SOLUTION | ORAL | Status: DC | PRN
  Administered 2023-04-25 (×2): 25 mg via ORAL
  Filled 2023-04-24 (×2): qty 10

## 2023-04-24 MED ORDER — HYDROMORPHONE HCL 1 MG/ML IJ SOLN
0.2500 mg | INTRAMUSCULAR | Status: DC | PRN
  Administered 2023-04-24 (×2): 0.5 mg via INTRAVENOUS

## 2023-04-24 MED ORDER — ORAL CARE MOUTH RINSE: 15.0000 mL | Freq: Once | OROMUCOSAL | Status: AC

## 2023-04-24 MED ORDER — 0.9 % SODIUM CHLORIDE (POUR BTL) OPTIME
TOPICAL | Status: DC | PRN
  Administered 2023-04-24: 1000 mL

## 2023-04-24 MED ORDER — SODIUM CHLORIDE (PF) 0.9 % IJ SOLN
INTRAMUSCULAR | Status: AC
  Filled 2023-04-24: qty 10

## 2023-04-24 MED ORDER — ACETAMINOPHEN 500 MG PO TABS: 1000.0000 mg | ORAL_TABLET | Freq: Once | ORAL | Status: DC

## 2023-04-24 MED ORDER — CARVEDILOL 3.125 MG PO TABS
3.1250 mg | ORAL_TABLET | Freq: Two times a day (BID) | ORAL | Status: DC
  Administered 2023-04-24: 3.125 mg via ORAL
  Filled 2023-04-24 (×3): qty 1

## 2023-04-24 MED ORDER — ROPINIROLE HCL 1 MG PO TABS
3.0000 mg | ORAL_TABLET | Freq: Every day | ORAL | Status: DC
  Administered 2023-04-24: 3 mg via ORAL
  Filled 2023-04-24: qty 3

## 2023-04-24 MED ORDER — INSULIN ASPART 100 UNIT/ML IJ SOLN
0.0000 [IU] | Freq: Three times a day (TID) | INTRAMUSCULAR | Status: DC
  Administered 2023-04-24: 5 [IU] via SUBCUTANEOUS
  Administered 2023-04-25: 3 [IU] via SUBCUTANEOUS
  Administered 2023-04-25: 5 [IU] via SUBCUTANEOUS

## 2023-04-24 MED ORDER — ONDANSETRON HCL 4 MG/2ML IJ SOLN: 4.0000 mg | Freq: Four times a day (QID) | INTRAMUSCULAR | Status: DC | PRN

## 2023-04-24 MED ORDER — MONTELUKAST SODIUM 10 MG PO TABS
10.0000 mg | ORAL_TABLET | Freq: Every day | ORAL | Status: DC
  Administered 2023-04-25: 10 mg via ORAL
  Filled 2023-04-24: qty 1

## 2023-04-24 MED ORDER — ASPIRIN 81 MG PO CHEW
81.0000 mg | CHEWABLE_TABLET | Freq: Two times a day (BID) | ORAL | Status: DC
  Administered 2023-04-25: 81 mg via ORAL
  Filled 2023-04-24: qty 1

## 2023-04-24 MED ORDER — TRANEXAMIC ACID-NACL 1000-0.7 MG/100ML-% IV SOLN
1000.0000 mg | INTRAVENOUS | Status: AC
  Administered 2023-04-24: 1000 mg via INTRAVENOUS
  Filled 2023-04-24: qty 100

## 2023-04-24 MED ORDER — BUPIVACAINE LIPOSOME 1.3 % IJ SUSP
INTRAMUSCULAR | Status: AC
  Filled 2023-04-24: qty 20

## 2023-04-24 MED ORDER — SODIUM CHLORIDE 0.9 % IV SOLN: INTRAVENOUS | Status: DC

## 2023-04-24 MED ORDER — SODIUM CHLORIDE (PF) 0.9 % IJ SOLN
INTRAMUSCULAR | Status: AC
  Filled 2023-04-24: qty 50

## 2023-04-24 MED ORDER — ACETAMINOPHEN 10 MG/ML IV SOLN
1000.0000 mg | Freq: Once | INTRAVENOUS | Status: AC
  Administered 2023-04-24: 1000 mg via INTRAVENOUS
  Filled 2023-04-24: qty 100

## 2023-04-24 MED ORDER — BISACODYL 10 MG RE SUPP: 10.0000 mg | Freq: Every day | RECTAL | Status: DC | PRN

## 2023-04-24 MED ORDER — ACETAMINOPHEN 500 MG PO TABS
1000.0000 mg | ORAL_TABLET | Freq: Four times a day (QID) | ORAL | Status: DC
  Filled 2023-04-24: qty 2

## 2023-04-24 MED ORDER — OXYCODONE HCL 5 MG PO TABS: 5.0000 mg | ORAL_TABLET | Freq: Once | ORAL | Status: DC | PRN

## 2023-04-24 MED ORDER — ONDANSETRON HCL 4 MG/2ML IJ SOLN
INTRAMUSCULAR | Status: AC
  Filled 2023-04-24: qty 2

## 2023-04-24 MED ORDER — HYDROMORPHONE HCL 1 MG/ML IJ SOLN
0.5000 mg | INTRAMUSCULAR | Status: DC | PRN
  Administered 2023-04-24 – 2023-04-25 (×3): 1 mg via INTRAVENOUS
  Filled 2023-04-24 (×4): qty 1

## 2023-04-24 MED ORDER — PROPOFOL 500 MG/50ML IV EMUL
INTRAVENOUS | Status: DC | PRN
  Administered 2023-04-24: 40 ug/kg/min via INTRAVENOUS

## 2023-04-24 MED ORDER — CEFAZOLIN SODIUM-DEXTROSE 2-4 GM/100ML-% IV SOLN
2.0000 g | Freq: Four times a day (QID) | INTRAVENOUS | Status: AC
  Administered 2023-04-24 (×2): 2 g via INTRAVENOUS
  Filled 2023-04-24 (×2): qty 100

## 2023-04-24 MED ORDER — METOCLOPRAMIDE HCL 5 MG PO TABS: 5.0000 mg | ORAL_TABLET | Freq: Three times a day (TID) | ORAL | Status: DC | PRN

## 2023-04-24 MED ORDER — PHENOL 1.4 % MT LIQD: 1.0000 | OROMUCOSAL | Status: DC | PRN

## 2023-04-24 MED ORDER — ALLOPURINOL 100 MG PO TABS
100.0000 mg | ORAL_TABLET | Freq: Every day | ORAL | Status: DC
  Administered 2023-04-25: 100 mg via ORAL
  Filled 2023-04-24: qty 1

## 2023-04-24 MED ORDER — LACTATED RINGERS IV SOLN: INTRAVENOUS | Status: DC

## 2023-04-24 MED ORDER — STERILE WATER FOR IRRIGATION IR SOLN
Status: DC | PRN
  Administered 2023-04-24: 2000 mL

## 2023-04-24 MED ORDER — ONDANSETRON HCL 4 MG/2ML IJ SOLN: 4.0000 mg | Freq: Once | INTRAMUSCULAR | Status: DC | PRN

## 2023-04-24 MED ORDER — FAMOTIDINE 20 MG PO TABS
20.0000 mg | ORAL_TABLET | Freq: Every day | ORAL | Status: DC
  Administered 2023-04-24: 20 mg via ORAL
  Filled 2023-04-24: qty 1

## 2023-04-24 MED ORDER — ONDANSETRON HCL 4 MG PO TABS: 4.0000 mg | ORAL_TABLET | Freq: Four times a day (QID) | ORAL | Status: DC | PRN

## 2023-04-24 MED ORDER — HYDROMORPHONE HCL 2 MG PO TABS
2.0000 mg | ORAL_TABLET | ORAL | Status: DC | PRN
  Administered 2023-04-24 (×3): 4 mg via ORAL
  Administered 2023-04-25: 2 mg via ORAL
  Administered 2023-04-25: 4 mg via ORAL
  Filled 2023-04-24: qty 1
  Filled 2023-04-24 (×4): qty 2

## 2023-04-24 MED ORDER — METOCLOPRAMIDE HCL 5 MG/ML IJ SOLN: 5.0000 mg | Freq: Three times a day (TID) | INTRAMUSCULAR | Status: DC | PRN

## 2023-04-24 MED ORDER — INSULIN GLARGINE-YFGN 100 UNIT/ML ~~LOC~~ SOLN
20.0000 [IU] | Freq: Every day | SUBCUTANEOUS | Status: DC
  Administered 2023-04-24: 20 [IU] via SUBCUTANEOUS
  Filled 2023-04-24 (×2): qty 0.2

## 2023-04-24 MED ORDER — METHOCARBAMOL 500 MG PO TABS
500.0000 mg | ORAL_TABLET | Freq: Four times a day (QID) | ORAL | Status: DC | PRN
  Administered 2023-04-24 – 2023-04-25 (×3): 500 mg via ORAL
  Filled 2023-04-24 (×4): qty 1

## 2023-04-24 MED ORDER — SACUBITRIL-VALSARTAN 49-51 MG PO TABS
1.0000 | ORAL_TABLET | Freq: Two times a day (BID) | ORAL | Status: DC
  Administered 2023-04-25: 1 via ORAL
  Filled 2023-04-24: qty 1

## 2023-04-24 MED ORDER — PHENYLEPHRINE HCL-NACL 20-0.9 MG/250ML-% IV SOLN
INTRAVENOUS | Status: DC | PRN
Start: 2023-04-24 — End: 2023-04-24
  Administered 2023-04-24: 25 ug/min via INTRAVENOUS

## 2023-04-24 MED ORDER — AMISULPRIDE (ANTIEMETIC) 5 MG/2ML IV SOLN: 10.0000 mg | Freq: Once | INTRAVENOUS | Status: DC | PRN

## 2023-04-24 MED ORDER — ACETAMINOPHEN 325 MG PO TABS
325.0000 mg | ORAL_TABLET | Freq: Four times a day (QID) | ORAL | Status: DC | PRN
  Administered 2023-04-25: 650 mg via ORAL
  Filled 2023-04-24: qty 2

## 2023-04-24 MED ORDER — ROPIVACAINE HCL 5 MG/ML IJ SOLN
INTRAMUSCULAR | Status: DC | PRN
Start: 2023-04-24 — End: 2023-04-24
  Administered 2023-04-24: 30 mL via PERINEURAL

## 2023-04-24 MED ORDER — CHLORHEXIDINE GLUCONATE 0.12 % MT SOLN
15.0000 mL | Freq: Once | OROMUCOSAL | Status: AC
  Administered 2023-04-24: 15 mL via OROMUCOSAL

## 2023-04-24 MED ORDER — MENTHOL 3 MG MT LOZG: 1.0000 | LOZENGE | OROMUCOSAL | Status: DC | PRN

## 2023-04-24 MED ORDER — DAPAGLIFLOZIN PROPANEDIOL 10 MG PO TABS
10.0000 mg | ORAL_TABLET | Freq: Every day | ORAL | Status: DC
  Administered 2023-04-25: 10 mg via ORAL
  Filled 2023-04-24: qty 1

## 2023-04-24 MED ORDER — SPIRONOLACTONE 12.5 MG HALF TABLET
12.5000 mg | ORAL_TABLET | Freq: Every day | ORAL | Status: DC
  Administered 2023-04-25: 12.5 mg via ORAL
  Filled 2023-04-24: qty 1

## 2023-04-24 MED ORDER — DEXAMETHASONE SODIUM PHOSPHATE 10 MG/ML IJ SOLN
INTRAMUSCULAR | Status: DC | PRN
Start: 2023-04-24 — End: 2023-04-24
  Administered 2023-04-24: 10 mg

## 2023-04-24 MED ORDER — ROSUVASTATIN CALCIUM 20 MG PO TABS: 20.0000 mg | ORAL_TABLET | Freq: Every evening | ORAL | Status: DC

## 2023-04-24 MED ORDER — HYDROMORPHONE HCL 1 MG/ML IJ SOLN
INTRAMUSCULAR | Status: AC
  Filled 2023-04-24: qty 1

## 2023-04-24 MED ORDER — OXYCODONE HCL 5 MG/5ML PO SOLN: 5.0000 mg | Freq: Once | ORAL | Status: DC | PRN

## 2023-04-24 MED ORDER — FLEET ENEMA RE ENEM: 1.0000 | ENEMA | Freq: Once | RECTAL | Status: DC | PRN

## 2023-04-24 MED ORDER — INSULIN ASPART 100 UNIT/ML IJ SOLN
0.0000 [IU] | Freq: Every day | INTRAMUSCULAR | Status: DC
  Administered 2023-04-24: 4 [IU] via SUBCUTANEOUS

## 2023-04-24 MED ORDER — BUPIVACAINE IN DEXTROSE 0.75-8.25 % IT SOLN
INTRATHECAL | Status: DC | PRN
Start: 2023-04-24 — End: 2023-04-24
  Administered 2023-04-24: 1.5 mL via INTRATHECAL

## 2023-04-24 MED ORDER — PREGABALIN 100 MG PO CAPS
100.0000 mg | ORAL_CAPSULE | Freq: Two times a day (BID) | ORAL | Status: DC
  Administered 2023-04-24 – 2023-04-25 (×3): 100 mg via ORAL
  Filled 2023-04-24 (×3): qty 1

## 2023-04-24 MED ORDER — SODIUM CHLORIDE 0.9 % IV SOLN
INTRAVENOUS | Status: DC | PRN
  Administered 2023-04-24: 80 mL

## 2023-04-24 MED ORDER — TRAMADOL HCL 50 MG PO TABS
50.0000 mg | ORAL_TABLET | Freq: Four times a day (QID) | ORAL | Status: DC | PRN
  Administered 2023-04-24 – 2023-04-25 (×2): 100 mg via ORAL
  Filled 2023-04-24 (×2): qty 2

## 2023-04-24 SURGICAL SUPPLY — 57 items
ADH SKN CLS APL DERMABOND .7 (GAUZE/BANDAGES/DRESSINGS) ×1
ATTUNE PSFEM LTSZ6 NARCEM KNEE (Femur) IMPLANT
ATTUNE PSRP INSR SZ6 10 KNEE (Insert) IMPLANT
BAG COUNTER SPONGE SURGICOUNT (BAG) IMPLANT
BAG SPEC THK2 15X12 ZIP CLS (MISCELLANEOUS) ×1
BAG SPNG CNTER NS LX DISP (BAG)
BAG ZIPLOCK 12X15 (MISCELLANEOUS) ×1 IMPLANT
BASE TIBIAL ROT PLAT SZ 5 KNEE (Knees) IMPLANT
BLADE SAG 18X100X1.27 (BLADE) ×1 IMPLANT
BLADE SAW SGTL 11.0X1.19X90.0M (BLADE) ×1 IMPLANT
BNDG CMPR 6 X 5 YARDS HK CLSR (GAUZE/BANDAGES/DRESSINGS) ×1
BNDG ELASTIC 6INX 5YD STR LF (GAUZE/BANDAGES/DRESSINGS) ×1 IMPLANT
BOWL SMART MIX CTS (DISPOSABLE) ×1 IMPLANT
BSPLAT TIB 5 CMNT ROT PLAT STR (Knees) ×1 IMPLANT
CEMENT HV SMART SET (Cement) ×2 IMPLANT
COVER SURGICAL LIGHT HANDLE (MISCELLANEOUS) ×1 IMPLANT
CUFF TOURN SGL QUICK 34 (TOURNIQUET CUFF) ×1
CUFF TRNQT CYL 34X4.125X (TOURNIQUET CUFF) ×1 IMPLANT
DERMABOND ADVANCED .7 DNX12 (GAUZE/BANDAGES/DRESSINGS) ×1 IMPLANT
DRAPE U-SHAPE 47X51 STRL (DRAPES) ×1 IMPLANT
DRESSING AQUACEL AG SP 3.5X10 (GAUZE/BANDAGES/DRESSINGS) IMPLANT
DRSG AQUACEL AG ADV 3.5X10 (GAUZE/BANDAGES/DRESSINGS) ×1 IMPLANT
DRSG AQUACEL AG SP 3.5X10 (GAUZE/BANDAGES/DRESSINGS) ×1
DURAPREP 26ML APPLICATOR (WOUND CARE) ×1 IMPLANT
ELECT REM PT RETURN 15FT ADLT (MISCELLANEOUS) ×1 IMPLANT
GLOVE BIO SURGEON STRL SZ 6.5 (GLOVE) IMPLANT
GLOVE BIO SURGEON STRL SZ8 (GLOVE) ×1 IMPLANT
GLOVE BIOGEL PI IND STRL 6.5 (GLOVE) IMPLANT
GLOVE BIOGEL PI IND STRL 7.0 (GLOVE) IMPLANT
GLOVE BIOGEL PI IND STRL 8 (GLOVE) ×1 IMPLANT
GOWN STRL REUS W/ TWL LRG LVL3 (GOWN DISPOSABLE) ×1 IMPLANT
GOWN STRL REUS W/TWL LRG LVL3 (GOWN DISPOSABLE) ×1
HANDPIECE INTERPULSE COAX TIP (DISPOSABLE) ×1
HOLDER FOLEY CATH W/STRAP (MISCELLANEOUS) IMPLANT
IMMOBILIZER KNEE 20 (SOFTGOODS) ×1
IMMOBILIZER KNEE 20 THIGH 36 (SOFTGOODS) ×1 IMPLANT
KIT TURNOVER KIT A (KITS) IMPLANT
MANIFOLD NEPTUNE II (INSTRUMENTS) ×1 IMPLANT
NS IRRIG 1000ML POUR BTL (IV SOLUTION) ×1 IMPLANT
PACK TOTAL KNEE CUSTOM (KITS) ×1 IMPLANT
PADDING CAST ABS COTTON 6X4 NS (CAST SUPPLIES) IMPLANT
PADDING CAST COTTON 6X4 STRL (CAST SUPPLIES) ×2 IMPLANT
PATELLA MEDIAL ATTUN 35MM KNEE (Knees) IMPLANT
PIN STEINMAN FIXATION KNEE (PIN) IMPLANT
PROTECTOR NERVE ULNAR (MISCELLANEOUS) ×1 IMPLANT
SET HNDPC FAN SPRY TIP SCT (DISPOSABLE) ×1 IMPLANT
SPIKE FLUID TRANSFER (MISCELLANEOUS) ×1 IMPLANT
SUT MNCRL AB 4-0 PS2 18 (SUTURE) ×1 IMPLANT
SUT STRATAFIX 0 PDS 27 VIOLET (SUTURE) ×1
SUT VIC AB 2-0 CT1 27 (SUTURE) ×3
SUT VIC AB 2-0 CT1 TAPERPNT 27 (SUTURE) ×3 IMPLANT
SUTURE STRATFX 0 PDS 27 VIOLET (SUTURE) ×1 IMPLANT
TIBIAL BASE ROT PLAT SZ 5 KNEE (Knees) ×1 IMPLANT
TRAY FOLEY MTR SLVR 16FR STAT (SET/KITS/TRAYS/PACK) ×1 IMPLANT
TUBE SUCTION HIGH CAP CLEAR NV (SUCTIONS) ×1 IMPLANT
WATER STERILE IRR 1000ML POUR (IV SOLUTION) ×2 IMPLANT
WRAP KNEE MAXI GEL POST OP (GAUZE/BANDAGES/DRESSINGS) ×1 IMPLANT

## 2023-04-24 NOTE — Discharge Instructions (Signed)
Ollen Gross, MD Total Joint Specialist [facility] [address], [address] [place], [place] [address] [phone]  TOTAL KNEE REPLACEMENT POSTOPERATIVE DIRECTIONS    Knee Rehabilitation, Guidelines Following Surgery  Results after knee surgery are often greatly improved when you follow the exercise, range of motion and muscle strengthening exercises prescribed by your doctor. Safety measures are also important to protect the knee from further injury. If any of these exercises cause you to have increased pain or swelling in your knee joint, decrease the amount until you are comfortable again and slowly increase them. If you have problems or questions, call your caregiver or physical therapist for advice.   BLOOD CLOT PREVENTION Take an 81 mg Aspirin two times a day for three weeks following surgery. Then take an 81 mg Aspirin once a day for three weeks. Then discontinue Aspirin. You may resume your vitamins/supplements upon discharge from the hospital. Do not take any NSAIDs (Advil, Aleve, Ibuprofen, Meloxicam, etc.) until you have discontinued the 81 mg Aspirin twice a day.  HOME CARE INSTRUCTIONS  Remove items at home which could result in a fall. This includes throw rugs or furniture in walking pathways.  ICE to the affected knee as much as tolerated. Icing helps control swelling. If the swelling is well controlled you will be more comfortable and rehab easier. Continue to use ice on the knee for pain and swelling from surgery. You may notice swelling that will progress down to the foot and ankle. This is normal after surgery. Elevate the leg when you are not up walking on it.    Continue to use the breathing machine which will help keep your temperature down. It is common for your temperature to cycle up and down following surgery, especially at night when you are not up moving around and exerting yourself. The breathing machine keeps your lungs expanded and your  temperature down. Do not place pillow under the operative knee, focus on keeping the knee straight while resting  DIET You may resume your previous home diet once you are discharged from the hospital.  DRESSING / WOUND CARE / SHOWERING Keep your bulky bandage on for 2 days. On the third post-operative day you may remove the Ace bandage and gauze. There is a waterproof adhesive bandage on your skin which will stay in place until your first follow-up appointment. Once you remove this you will not need to place another bandage You may begin showering 3 days following surgery, but do not submerge the incision under water.  ACTIVITY For the first 5 days, the key is rest and control of pain and swelling Do your home exercises twice a day starting on post-operative day 3. On the days you go to physical therapy, just do the home exercises once that day. You should rest, ice and elevate the leg for 50 minutes out of every hour. Get up and walk/stretch for 10 minutes per hour. After 5 days you can increase your activity slowly as tolerated. Walk with your walker as instructed. Use the walker until you are comfortable transitioning to a cane. Walk with the cane in the opposite hand of the operative leg. You may discontinue the cane once you are comfortable and walking steadily. Avoid periods of inactivity such as sitting longer than an hour when not asleep. This helps prevent blood clots.  You may discontinue the knee immobilizer once you are able to perform a straight leg raise while lying down. You may resume a sexual relationship  in one month or when given the OK by your doctor.  You may return to work once you are cleared by your doctor.  Do not drive a car for 6 weeks or until released by your surgeon.  Do not drive while taking narcotics.  TED HOSE STOCKINGS Wear the elastic stockings on both legs for three weeks following surgery during the day. You may remove them at night for sleeping.  WEIGHT  BEARING Weight bearing as tolerated with assist device (walker, cane, etc) as directed, use it as long as suggested by your surgeon or therapist, typically at least 4-6 weeks.  POSTOPERATIVE CONSTIPATION PROTOCOL Constipation - defined medically as fewer than three stools per week and severe constipation as less than one stool per week.  One of the most common issues patients have following surgery is constipation.  Even if you have a regular bowel pattern at home, your normal regimen is likely to be disrupted due to multiple reasons following surgery.  Combination of anesthesia, postoperative narcotics, change in appetite and fluid intake all can affect your bowels.  In order to avoid complications following surgery, here are some recommendations in order to help you during your recovery period.  Colace (docusate) - Pick up an over-the-counter form of Colace or another stool softener and take twice a day as long as you are requiring postoperative pain medications.  Take with a full glass of water daily.  If you experience loose stools or diarrhea, hold the colace until you stool forms back up. If your symptoms do not get better within 1 week or if they get worse, check with your doctor. Dulcolax (bisacodyl) - Pick up over-the-counter and take as directed by the product packaging as needed to assist with the movement of your bowels.  Take with a full glass of water.  Use this product as needed if not relieved by Colace only.  MiraLax (polyethylene glycol) - Pick up over-the-counter to have on hand. MiraLax is a solution that will increase the amount of water in your bowels to assist with bowel movements.  Take as directed and can mix with a glass of water, juice, soda, coffee, or tea. Take if you go more than two days without a movement. Do not use MiraLax more than once per day. Call your doctor if you are still constipated or irregular after using this medication for 7 days in a row.  If you continue  to have problems with postoperative constipation, please contact the office for further assistance and recommendations.  If you experience "the worst abdominal pain ever" or develop nausea or vomiting, please contact the office immediatly for further recommendations for treatment.  ITCHING If you experience itching with your medications, try taking only a single pain pill, or even half a pain pill at a time.  You can also use Benadryl over the counter for itching or also to help with sleep.   MEDICATIONS See your medication summary on the "After Visit Summary" that the nursing staff will review with you prior to discharge.  You may have some home medications which will be placed on hold until you complete the course of blood thinner medication.  It is important for you to complete the blood thinner medication as prescribed by your surgeon.  Continue your approved medications as instructed at time of discharge.  PRECAUTIONS If you experience chest pain or shortness of breath - call 911 immediately for transfer to the hospital emergency department.  If you develop a fever greater  that 101 F, purulent drainage from wound, increased redness or drainage from wound, foul odor from the wound/dressing, or calf pain - CONTACT YOUR SURGEON.                                                   FOLLOW-UP APPOINTMENTS Make sure you keep all of your appointments after your operation with your surgeon and caregivers. You should call the office at the above phone number and make an appointment for approximately two weeks after the date of your surgery or on the date instructed by your surgeon outlined in the "After Visit Summary".  RANGE OF MOTION AND STRENGTHENING EXERCISES  Rehabilitation of the knee is important following a knee injury or an operation. After just a few days of immobilization, the muscles of the thigh which control the knee become weakened and shrink (atrophy). Knee exercises are designed to build up  the tone and strength of the thigh muscles and to improve knee motion. Often times heat used for twenty to thirty minutes before working out will loosen up your tissues and help with improving the range of motion but do not use heat for the first two weeks following surgery. These exercises can be done on a training (exercise) mat, on the floor, on a table or on a bed. Use what ever works the best and is most comfortable for you Knee exercises include:  Leg Lifts - While your knee is still immobilized in a splint or cast, you can do straight leg raises. Lift the leg to 60 degrees, hold for 3 sec, and slowly lower the leg. Repeat 10-20 times 2-3 times daily. Perform this exercise against resistance later as your knee gets better.  Quad and Hamstring Sets - Tighten up the muscle on the front of the thigh (Quad) and hold for 5-10 sec. Repeat this 10-20 times hourly. Hamstring sets are done by pushing the foot backward against an object and holding for 5-10 sec. Repeat as with quad sets.  Leg Slides: Lying on your back, slowly slide your foot toward your buttocks, bending your knee up off the floor (only go as far as is comfortable). Then slowly slide your foot back down until your leg is flat on the floor again. Angel Wings: Lying on your back spread your legs to the side as far apart as you can without causing discomfort.  A rehabilitation program following serious knee injuries can speed recovery and prevent re-injury in the future due to weakened muscles. Contact your doctor or a physical therapist for more information on knee rehabilitation.   POST-OPERATIVE OPIOID TAPER INSTRUCTIONS: It is important to wean off of your opioid medication as soon as possible. If you do not need pain medication after your surgery it is ok to stop day one. Opioids include: Codeine, Hydrocodone(Norco, Vicodin), Oxycodone(Percocet, oxycontin) and hydromorphone amongst others.  Long term and even short term use of opiods can  cause: Increased pain response Dependence Constipation Depression Respiratory depression And more.  Withdrawal symptoms can include Flu like symptoms Nausea, vomiting And more Techniques to manage these symptoms Hydrate well Eat regular healthy meals Stay active Use relaxation techniques(deep breathing, meditating, yoga) Do Not substitute Alcohol to help with tapering If you have been on opioids for less than two weeks and do not have pain than it is ok to stop all  together.  Plan to wean off of opioids This plan should start within one week post op of your joint replacement. Maintain the same interval or time between taking each dose and first decrease the dose.  Cut the total daily intake of opioids by one tablet each day Next start to increase the time between doses. The last dose that should be eliminated is the evening dose.   IF YOU ARE TRANSFERRED TO A SKILLED REHAB FACILITY If the patient is transferred to a skilled rehab facility following release from the hospital, a list of the current medications will be sent to the facility for the patient to continue.  When discharged from the skilled rehab facility, please have the facility set up the patient's Home Health Physical Therapy prior to being released. Also, the skilled facility will be responsible for providing the patient with their medications at time of release from the facility to include their pain medication, the muscle relaxants, and their blood thinner medication. If the patient is still at the rehab facility at time of the two week follow up appointment, the skilled rehab facility will also need to assist the patient in arranging follow up appointment in our office and any transportation needs.  MAKE SURE YOU:  Understand these instructions.  Get help right away if you are not doing well or get worse.   DENTAL ANTIBIOTICS:  In most cases prophylactic antibiotics for Dental procdeures after total joint surgery are  not necessary.  Exceptions are as follows:  1. History of prior total joint infection  2. Severely immunocompromised (Organ Transplant, cancer chemotherapy, Rheumatoid biologic medications such as Humera)  3. Poorly controlled diabetes (A1C &gt; 8.0, blood glucose over 200)  If you have one of these conditions, contact your surgeon for an antibiotic prescription, prior to your dental procedure.    Pick up stool softner and laxative for home use following surgery while on pain medications. Do not submerge incision under water. Please use good hand washing techniques while changing dressing each day. May shower starting three days after surgery. Please use a clean towel to pat the incision dry following showers. Continue to use ice for pain and swelling after surgery. Do not use any lotions or creams on the incision until instructed by your surgeon.

## 2023-04-24 NOTE — Anesthesia Postprocedure Evaluation (Signed)
Anesthesia Post Note  Patient: [name]  Procedure(s) Performed: TOTAL KNEE ARTHROPLASTY (Left: Knee)     Patient location during evaluation: PACU Anesthesia Type: Regional, MAC and Spinal Level of consciousness: awake and alert and oriented Pain management: pain level controlled Vital Signs Assessment: post-procedure vital signs reviewed and stable Respiratory status: spontaneous breathing, nonlabored ventilation and respiratory function stable Cardiovascular status: blood pressure returned to baseline and stable Postop Assessment: no headache, no backache, spinal receding and no apparent nausea or vomiting Anesthetic complications: no   No notable events documented.  Last Vitals:  Vitals:   04/24/23 1045 04/24/23 1113  BP: (!) 140/56 138/62  Pulse: (!) 51   Resp: 11 14  Temp: (!) 36.3 C 36.5 C  SpO2: 99% 92%    Last Pain:  Vitals:   04/24/23 1113  TempSrc: Oral  PainSc: 1                  [name]

## 2023-04-24 NOTE — Anesthesia Procedure Notes (Addendum)
Spinal  Patient location during procedure: OR Start time: 04/24/2023 8:16 AM End time: 04/24/2023 8:27 AM Reason for block: surgical anesthesia Staffing Performed: anesthesiologist  Anesthesiologist: Lannie Fields, DO Performed by: Lannie Fields, DO Authorized by: Lannie Fields, DO   Preanesthetic Checklist Completed: patient identified, IV checked, risks and benefits discussed, surgical consent, monitors and equipment checked, pre-op evaluation and timeout performed Spinal Block Patient position: sitting Prep: DuraPrep and site prepped and draped Patient monitoring: cardiac monitor, continuous pulse ox and blood pressure Approach: midline Location: L3-4 Injection technique: single-shot Needle Needle type: Pencan  Needle gauge: 24 G Needle length: 9 cm Assessment Sensory level: T6 Events: CSF return and second provider Additional Notes Functioning IV was confirmed and monitors were applied. Sterile prep and drape, including hand hygiene and sterile gloves were used. The patient was positioned and the spine was prepped. The skin was anesthetized with lidocaine.  Free flow of clear CSF was obtained prior to injecting local anesthetic into the CSF.  The spinal needle aspirated freely following injection.  The needle was carefully withdrawn.  The patient tolerated the procedure well.

## 2023-04-24 NOTE — Progress Notes (Signed)
Orthopedic Tech Progress Note Patient Details:  [name] 09/01/1945 [id number]  CPM Left Knee CPM Left Knee: On Left Knee Flexion (Degrees): 40 Left Knee Extension (Degrees): 10  Post Interventions Patient Tolerated: Well  Darleen Crocker 04/24/2023, 10:19 AM

## 2023-04-24 NOTE — Plan of Care (Signed)
  Problem: Nutritional: Goal: Maintenance of adequate nutrition will improve Outcome: Progressing Goal: Progress toward achieving an optimal weight will improve Outcome: Progressing   Problem: Activity: Goal: Risk for activity intolerance will decrease Outcome: Progressing   Problem: Safety: Goal: Ability to remain free from injury will improve Outcome: Progressing   Problem: Pain Managment: Goal: General experience of comfort will improve Outcome: Progressing

## 2023-04-24 NOTE — Anesthesia Procedure Notes (Signed)
Anesthesia Regional Block: Adductor canal block   Pre-Anesthetic Checklist: , timeout performed,  Correct Patient, Correct Site, Correct Laterality,  Correct Procedure, Correct Position, site marked,  Risks and benefits discussed,  Surgical consent,  Pre-op evaluation,  At surgeon's request and post-op pain management  Laterality: Left  Prep: Maximum Sterile Barrier Precautions used, chloraprep       Needles:  Injection technique: Single-shot  Needle Type: Echogenic Stimulator Needle     Needle Length: 9cm  Needle Gauge: 22     Additional Needles:   Procedures:,,,, ultrasound used (permanent image in chart),,    Narrative:  Start time: 04/24/2023 8:00 AM End time: 04/24/2023 8:05 AM Injection made incrementally with aspirations every 5 mL.  Performed by: Personally  Anesthesiologist: Lannie Fields, DO  Additional Notes: Monitors applied. No increased pain on injection. No increased resistance to injection. Injection made in 5cc increments. Good needle visualization. Patient tolerated procedure well.

## 2023-04-24 NOTE — Evaluation (Signed)
Physical Therapy Evaluation Patient Details Name: [name] MRN: [id number] DOB: Feb 23, 1946 Today's Date: 04/24/2023  History of Present Illness  77 yo female presents to therapy s/p L TKA on 04/24/2023 due to failure of conservative measures. Pt PMH includes but is not limited to: CKD III, L vocal fold paresis, DM II, dysphonia, R reverse TSA (2022), cervical DDD, LBP s/p laminectomy, RLS, GERD, sCHF, and gout.  Clinical Impression      [name] is a 77 y.o. female POD 0 s/p L TKA. Patient reports Mod I with mobility at baseline. Patient is now limited by functional impairments (see PT problem list below) and requires min A for bed mobility and min A for transfers. Patient was able to ambulate 12 feet with RW and CGA level of assist. Patient instructed in exercise to facilitate ROM and circulation to manage edema. Patient will benefit from continued skilled PT interventions to address impairments and progress towards PLOF. Acute PT will follow to progress mobility and stair training in preparation for safe discharge home with family support and OPPT services.     If plan is discharge home, recommend the following: A little help with walking and/or transfers;A little help with bathing/dressing/bathroom;Assistance with cooking/housework;Assist for transportation;Help with stairs or ramp for entrance   Can travel by private vehicle        Equipment Recommendations Rolling walker (2 wheels)  Recommendations for Other Services       Functional Status Assessment Patient has had a recent decline in their functional status and demonstrates the ability to make significant improvements in function in a reasonable and predictable amount of time.     Precautions / Restrictions Precautions Precautions: Knee;Fall Restrictions Weight Bearing Restrictions: No      Mobility  Bed Mobility Overal bed mobility: Needs Assistance Bed Mobility: Supine to Sit     Supine to sit: Used rails,  HOB elevated, Min assist     General bed mobility comments: cues and min A for LLE to EOB    Transfers Overall transfer level: Needs assistance Equipment used: Rolling walker (2 wheels) Transfers: Sit to/from Stand Sit to Stand: Min assist, From elevated surface           General transfer comment: min cues and pt initally self limiting LLE WB    Ambulation/Gait Ambulation/Gait assistance: Contact guard assist Gait Distance (Feet): 12 Feet Assistive device: Rolling walker (2 wheels) Gait Pattern/deviations: Step-to pattern, Decreased stance time - left, Antalgic, Trunk flexed Gait velocity: decreased     General Gait Details: heavy reliance on B UE support at RW for offloading L LE in stance phase.  Stairs            Wheelchair Mobility     Tilt Bed    Modified Rankin (Stroke Patients Only)       Balance Overall balance assessment: Needs assistance Sitting-balance support: Feet supported Sitting balance-Leahy Scale: Good     Standing balance support: Bilateral upper extremity supported, During functional activity, Reliant on assistive device for balance Standing balance-Leahy Scale: Poor                               Pertinent Vitals/Pain Pain Assessment Pain Assessment: 0-10 Pain Score: 8  Pain Location: L hip, knee and LBP Pain Descriptors / Indicators: Aching, Burning, Constant, Discomfort, Dull, Grimacing, Operative site guarding Pain Intervention(s): Limited activity within patient's tolerance, Monitored during session, Premedicated before session, Repositioned, Ice  applied    Home Living Family/patient expects to be discharged to:: Private residence Living Arrangements: Spouse/significant other;Children Available Help at Discharge: Family Type of Home: House Home Access: Stairs to enter Entrance Stairs-Rails: None Entrance Stairs-Number of Steps: 1   Home Layout: Laundry or work area in basement;Two level;Able to live on main  level with bedroom/bathroom Home Equipment: Cane - single point      Prior Function Prior Level of Function : Independent/Modified Independent;Driving             Mobility Comments: mod I with use of SPC due to back pain, mod I all ADLs, self care tasks and IADLs ADLs Comments: active and just finished painting several rooms in her home and navigating ladder/step stool     Extremity/Trunk Assessment        Lower Extremity Assessment Lower Extremity Assessment: LLE deficits/detail LLE Deficits / Details: ankle DF 5/5 and PF 4/5; SLR with AA LLE Sensation: WNL    Cervical / Trunk Assessment Cervical / Trunk Assessment: Back Surgery  Communication   Communication Communication: No apparent difficulties  Cognition Arousal: Alert Behavior During Therapy: WFL for tasks assessed/performed Overall Cognitive Status: Within Functional Limits for tasks assessed                                          General Comments      Exercises Total Joint Exercises Ankle Circles/Pumps: AROM, Both, 15 reps   Assessment/Plan    PT Assessment Patient needs continued PT services  PT Problem List Decreased strength;Decreased range of motion;Decreased activity tolerance;Decreased balance;Decreased mobility;Decreased coordination;Pain       PT Treatment Interventions DME instruction;Gait training;Stair training;Functional mobility training;Therapeutic activities;Therapeutic exercise;Balance training;Neuromuscular re-education;Patient/family education;Modalities    PT Goals (Current goals can be found in the Care Plan section)  Acute Rehab PT Goals Patient Stated Goal: to be able to walk without pain PT Goal Formulation: With patient Time For Goal Achievement: 05/08/23 Potential to Achieve Goals: Good    Frequency 7X/week     Co-evaluation               AM-PAC PT "6 Clicks" Mobility  Outcome Measure Help needed turning from your back to your side while in  a flat bed without using bedrails?: A Little Help needed moving from lying on your back to sitting on the side of a flat bed without using bedrails?: A Little Help needed moving to and from a bed to a chair (including a wheelchair)?: A Little Help needed standing up from a chair using your arms (e.g., wheelchair or bedside chair)?: A Little Help needed to walk in hospital room?: A Little Help needed climbing 3-5 steps with a railing? : A Lot 6 Click Score: 17    End of Session Equipment Utilized During Treatment: Gait belt Activity Tolerance: Patient limited by pain Patient left: in chair;with call bell/phone within reach;with chair alarm set;with family/visitor present Nurse Communication: Mobility status;Patient requests pain meds PT Visit Diagnosis: Unsteadiness on feet (R26.81);Other abnormalities of gait and mobility (R26.89);Muscle weakness (generalized) (M62.81);Difficulty in walking, not elsewhere classified (R26.2);Pain Pain - Right/Left: Left Pain - part of body: Leg;Knee;Hip    Time: 0981-1914 PT Time Calculation (min) (ACUTE ONLY): 24 min   Charges:   PT Evaluation $PT Eval Low Complexity: 1 Low PT Treatments $Therapeutic Activity: 8-22 mins PT General Charges $$ ACUTE PT VISIT: 1 Visit  Johnny Bridge, PT Acute Rehab   Jacqualyn Posey 04/24/2023, 2:02 PM

## 2023-04-24 NOTE — Progress Notes (Signed)
Orthopedic Tech Progress Note Patient Details:  [name] [birth year] [id number]  CPM Left Knee CPM Left Knee: Off Left Knee Flexion (Degrees): 40 Left Knee Extension (Degrees): 10  Post Interventions Patient Tolerated: Well Cpm removed by PT before session. Darleen Crocker 04/24/2023, 2:11 PM

## 2023-04-24 NOTE — Transfer of Care (Signed)
Immediate Anesthesia Transfer of Care Note  Patient: [name]  Procedure(s) Performed: TOTAL KNEE ARTHROPLASTY (Left: Knee)  Patient Location: PACU  Anesthesia Type:MAC and Spinal  Level of Consciousness: awake, alert , oriented, and patient cooperative  Airway & Oxygen Therapy: Patient Spontanous Breathing and Patient connected to face mask oxygen  Post-op Assessment: Report given to RN and Post -op Vital signs reviewed and stable  Post vital signs: Reviewed and stable  Last Vitals:  Vitals Value Taken Time  BP 125/64 04/24/23 0951  Temp    Pulse 64 04/24/23 0953  Resp 20 04/24/23 0953  SpO2 100 % 04/24/23 0953  Vitals shown include unfiled device data.  Last Pain:  Vitals:   04/24/23 0755  TempSrc:   PainSc: 0-No pain         Complications: No notable events documented.

## 2023-04-24 NOTE — Interval H&P Note (Signed)
History and Physical Interval Note:  04/24/2023 6:31 AM  [name]  has presented today for surgery, with the diagnosis of left knee osteoarthritis.  The various methods of treatment have been discussed with the patient and family. After consideration of risks, benefits and other options for treatment, the patient has consented to  Procedure(s): TOTAL KNEE ARTHROPLASTY (Left) as a surgical intervention.  The patient's history has been reviewed, patient examined, no change in status, stable for surgery.  I have reviewed the patient's chart and labs.  Questions were answered to the patient's satisfaction.     Homero Fellers Carter Kassel

## 2023-04-24 NOTE — Op Note (Signed)
OPERATIVE REPORT-TOTAL KNEE ARTHROPLASTY   Pre-operative diagnosis- Osteoarthritis  Left knee(s)  Post-operative diagnosis- Osteoarthritis Left knee(s)  Procedure-  Left  Total Knee Arthroplasty  Surgeon- Kristen Rankin. Zalia Hautala, MD  Assistant- Arcola Jansky, PA-C   Anesthesia-   Adductor canal block and spinal  EBL-50 mL   Drains None  Tourniquet time-  Total Tourniquet Time Documented: Thigh (Left) - 35 minutes Total: Thigh (Left) - 35 minutes     Complications- None  Condition-PACU - hemodynamically stable.   Brief Clinical Note  [name] is a 77 y.o. year old female with end stage OA of her left knee with progressively worsening pain and dysfunction. She has constant pain, with activity and at rest and significant functional deficits with difficulties even with ADLs. She has had extensive non-op management including analgesics, injections of cortisone and viscosupplements, and home exercise program, but remains in significant pain with significant dysfunction. Radiographs show bone on bone arthritis lateral and patellofemoral. She presents now for left Total Knee Arthroplasty.     Procedure in detail---   The patient is brought into the operating room and positioned supine on the operating table. After successful administration of  Adductor canal block and spinal,   a tourniquet is placed high on the  Left thigh(s) and the lower extremity is prepped and draped in the usual sterile fashion. Time out is performed by the operating team and then the  Left lower extremity is wrapped in Esmarch, knee flexed and the tourniquet inflated to 300 mmHg.       A midline incision is made with a ten blade through the subcutaneous tissue to the level of the extensor mechanism. A fresh blade is used to make a medial parapatellar arthrotomy. Soft tissue over the proximal medial tibia is subperiosteally elevated to the joint line with a knife and into the semimembranosus bursa with a Cobb  elevator. Soft tissue over the proximal lateral tibia is elevated with attention being paid to avoiding the patellar tendon on the tibial tubercle. The patella is everted, knee flexed 90 degrees and the ACL and PCL are removed. Findings are bone on bone lateral and patellofemoral with large global osteophytes        The drill is used to create a starting hole in the distal femur and the canal is thoroughly irrigated with sterile saline to remove the fatty contents. The 5 degree Left  valgus alignment guide is placed into the femoral canal and the distal femoral cutting block is pinned to remove 9 mm off the distal femur. Resection is made with an oscillating saw.      The tibia is subluxed forward and the menisci are removed. The extramedullary alignment guide is placed referencing proximally at the medial aspect of the tibial tubercle and distally along the second metatarsal axis and tibial crest. The block is pinned to remove 2mm off the more deficient lateral  side. Resection is made with an oscillating saw. Size 5is the most appropriate size for the tibia and the proximal tibia is prepared with the modular drill and keel punch for that size.      The femoral sizing guide is placed and size 6 is most appropriate. Rotation is marked off the epicondylar axis and confirmed by creating a rectangular flexion gap at 90 degrees. The size 6 cutting block is pinned in this rotation and the anterior, posterior and chamfer cuts are made with the oscillating saw. The intercondylar block is then placed and that cut is  made.      Trial size 5 tibial component, trial size 6 narrow posterior stabilized femur and a 10  mm posterior stabilized rotating platform insert trial is placed. Full extension is achieved with excellent varus/valgus and anterior/posterior balance throughout full range of motion. The patella is everted and thickness measured to be 22  mm. Free hand resection is taken to 12 mm, a 35 template is placed, lug  holes are drilled, trial patella is placed, and it tracks normally. Osteophytes are removed off the posterior femur with the trial in place. All trials are removed and the cut bone surfaces prepared with pulsatile lavage. Cement is mixed and once ready for implantation, the size 5 tibial implant, size  6 narrow posterior stabilized femoral component, and the size 35 patella are cemented in place and the patella is held with the clamp. The trial insert is placed and the knee held in full extension. The Exparel (20 ml mixed with 60 ml saline) is injected into the extensor mechanism, posterior capsule, medial and lateral gutters and subcutaneous tissues.  All extruded cement is removed and once the cement is hard the permanent 10 mm posterior stabilized rotating platform insert is placed into the tibial tray.      The wound is copiously irrigated with saline solution and the extensor mechanism closed with # 0 Stratofix suture. The tourniquet is released for a total tourniquet time of 35  minutes. Flexion against gravity is 140 degrees and the patella tracks normally. Subcutaneous tissue is closed with 2.0 vicryl and subcuticular with running 4.0 Monocryl. The incision is cleaned and dried and steri-strips and a bulky sterile dressing are applied. The limb is placed into a knee immobilizer and the patient is awakened and transported to recovery in stable condition.      Please note that a surgical assistant was a medical necessity for this procedure in order to perform it in a safe and expeditious manner. Surgical assistant was necessary to retract the ligaments and vital neurovascular structures to prevent injury to them and also necessary for proper positioning of the limb to allow for anatomic placement of the prosthesis.   Kristen Rankin Keishawn Rajewski, MD    04/24/2023, 9:27 AM

## 2023-04-24 NOTE — Anesthesia Procedure Notes (Signed)
Procedure Name: MAC Date/Time: 04/24/2023 8:14 AM  Performed by: Elisabeth Cara, CRNAPre-anesthesia Checklist: Patient identified, Emergency Drugs available, Suction available, Patient being monitored and Timeout performed Patient Re-evaluated:Patient Re-evaluated prior to induction Oxygen Delivery Method: Simple face mask Placement Confirmation: positive ETCO2 Dental Injury: Teeth and Oropharynx as per pre-operative assessment

## 2023-04-25 ENCOUNTER — Other Ambulatory Visit (HOSPITAL_COMMUNITY): Payer: Self-pay

## 2023-04-25 ENCOUNTER — Encounter (HOSPITAL_COMMUNITY): Payer: Self-pay | Admitting: Orthopedic Surgery

## 2023-04-25 DIAGNOSIS — M1712 Unilateral primary osteoarthritis, left knee: Secondary | ICD-10-CM | POA: Diagnosis not present

## 2023-04-25 LAB — CBC
HCT: 34.8 % — ABNORMAL LOW (ref 36.0–46.0)
Hemoglobin: 10.7 g/dL — ABNORMAL LOW (ref 12.0–15.0)
MCH: 27.8 pg (ref 26.0–34.0)
MCHC: 30.7 g/dL (ref 30.0–36.0)
MCV: 90.4 fL (ref 80.0–100.0)
Platelets: 229 10*3/uL (ref 150–400)
RBC: 3.85 MIL/uL — ABNORMAL LOW (ref 3.87–5.11)
RDW: 15.1 % (ref 11.5–15.5)
WBC: 10.6 10*3/uL — ABNORMAL HIGH (ref 4.0–10.5)
nRBC: 0 % (ref 0.0–0.2)

## 2023-04-25 LAB — BASIC METABOLIC PANEL
Anion gap: 7 (ref 5–15)
BUN: 28 mg/dL — ABNORMAL HIGH (ref 8–23)
CO2: 23 mmol/L (ref 22–32)
Calcium: 8.5 mg/dL — ABNORMAL LOW (ref 8.9–10.3)
Chloride: 103 mmol/L (ref 98–111)
Creatinine, Ser: 1.12 mg/dL — ABNORMAL HIGH (ref 0.44–1.00)
GFR, Estimated: 51 mL/min — ABNORMAL LOW (ref >60)
Glucose, Bld: 291 mg/dL — ABNORMAL HIGH (ref 70–99)
Potassium: 5.1 mmol/L (ref 3.5–5.1)
Sodium: 133 mmol/L — ABNORMAL LOW (ref 135–145)

## 2023-04-25 LAB — GLUCOSE, CAPILLARY
Glucose-Capillary: 227 mg/dL — ABNORMAL HIGH (ref 70–99)
Glucose-Capillary: 199 mg/dL — ABNORMAL HIGH (ref 70–99)

## 2023-04-25 MED ORDER — METHOCARBAMOL 500 MG PO TABS
500.0000 mg | ORAL_TABLET | Freq: Four times a day (QID) | ORAL | 0 refills | Status: DC | PRN
  Filled 2023-04-25: qty 40, 10d supply, fill #0

## 2023-04-25 MED ORDER — ASPIRIN 81 MG PO CHEW
81.0000 mg | CHEWABLE_TABLET | Freq: Two times a day (BID) | ORAL | 0 refills | Status: DC
  Filled 2023-04-25: qty 40, 20d supply, fill #0

## 2023-04-25 MED ORDER — ONDANSETRON HCL 4 MG PO TABS
4.0000 mg | ORAL_TABLET | Freq: Four times a day (QID) | ORAL | 0 refills | Status: DC | PRN
  Filled 2023-04-25: qty 20, 5d supply, fill #0

## 2023-04-25 MED ORDER — HYDROMORPHONE HCL 2 MG PO TABS
2.0000 mg | ORAL_TABLET | Freq: Four times a day (QID) | ORAL | 0 refills | Status: DC | PRN
  Filled 2023-04-25: qty 42, 6d supply, fill #0

## 2023-04-25 MED ORDER — HYDROMORPHONE HCL 2 MG PO TABS
2.0000 mg | ORAL_TABLET | Freq: Four times a day (QID) | ORAL | 0 refills | Status: DC | PRN
Start: 2023-04-25 — End: 2023-05-09
  Filled 2023-04-25: qty 42, 6d supply, fill #0

## 2023-04-25 MED ORDER — TRAMADOL HCL 50 MG PO TABS
50.0000 mg | ORAL_TABLET | Freq: Four times a day (QID) | ORAL | 0 refills | Status: DC | PRN
  Filled 2023-04-25: qty 40, 5d supply, fill #0

## 2023-04-25 MED ORDER — ASPIRIN 81 MG PO TBEC
DELAYED_RELEASE_TABLET | ORAL | 0 refills | Status: DC
  Filled 2023-04-25: qty 63, 21d supply, fill #0

## 2023-04-25 NOTE — Progress Notes (Signed)
Physical Therapy Treatment Patient Details Name: [name] MRN: [id number] DOB: [birth year] Today's Date: 04/25/2023   History of Present Illness 77 yo female presents to therapy s/p L TKA on 04/24/2023 due to failure of conservative measures. Pt PMH includes but is not limited to: CKD III, L vocal fold paresis, DM II, dysphonia, R reverse TSA (2022), cervical DDD, LBP s/p laminectomy, RLS, GERD, sCHF, and gout.    PT Comments  POD # 1 pm session with Spouse Pt feeling "better" after a nap.  Pt able to self rise OOB using her leg strap.  Had Spouse "hands on" assist with transfers and amb using safety belt.  Pt amb a functional distance in hallway with NO c/o nausea.  Practiced ONE step up forward with walker with Spouse. Addressed all mobility questions, discussed appropriate activity, educated on use of ICE.  Pt ready for D/C to home.    If plan is discharge home, recommend the following: A little help with walking and/or transfers;A little help with bathing/dressing/bathroom;Assistance with cooking/housework;Assist for transportation;Help with stairs or ramp for entrance   Can travel by private vehicle        Equipment Recommendations  Rolling walker (2 wheels)    Recommendations for Other Services       Precautions / Restrictions Precautions Precautions: Knee;Fall Precaution Comments: no pillow inder knee Restrictions Weight Bearing Restrictions: No LLE Weight Bearing: Weight bearing as tolerated     Mobility  Bed Mobility Overal bed mobility: Needs Assistance Bed Mobility: Supine to Sit     Supine to sit: Supervision     General bed mobility comments: demonstarted and instructed how to use a belt to guide LE off bed    Transfers Overall transfer level: Needs assistance Equipment used: Rolling walker (2 wheels) Transfers: Sit to/from Stand Sit to Stand: Supervision           General transfer comment: 25% VC's on proper hand placement and increased time.   Good safety cognition.    Ambulation/Gait Ambulation/Gait assistance: Supervision Gait Distance (Feet): 28 Feet Assistive device: Rolling walker (2 wheels) Gait Pattern/deviations: Step-to pattern, Decreased stance time - left, Antalgic, Trunk flexed Gait velocity: decreased     General Gait Details: tolerated a functional distance with no c/o nausea this session.  Spouse present and "hands on" asissted.   Stairs Stairs: Yes Stairs assistance: Supervision, Contact guard assist Stair Management: No rails, Step to pattern, Forwards, With walker Number of Stairs: 1 General stair comments: practiced ONE step forward with walker with Spouse   Wheelchair Mobility     Tilt Bed    Modified Rankin (Stroke Patients Only)       Balance                                            Cognition Arousal: Alert Behavior During Therapy: WFL for tasks assessed/performed Overall Cognitive Status: Within Functional Limits for tasks assessed                                 General Comments: AxO x 3 pleasant.  Took a nap after lunch and wants to "go today".        Exercises      General Comments        Pertinent Vitals/Pain Pain Assessment Pain Assessment: 0-10 Pain  Score: 7  Pain Location: L knee Pain Descriptors / Indicators: Aching, Burning, Constant, Discomfort, Dull, Grimacing, Operative site guarding Pain Intervention(s): Monitored during session, Patient requesting pain meds-RN notified, Ice applied, Repositioned    Home Living                          Prior Function            PT Goals (current goals can now be found in the care plan section) Progress towards PT goals: Progressing toward goals    Frequency    7X/week      PT Plan      Co-evaluation              AM-PAC PT "6 Clicks" Mobility   Outcome Measure  Help needed turning from your back to your side while in a flat bed without using bedrails?:  None Help needed moving from lying on your back to sitting on the side of a flat bed without using bedrails?: None Help needed moving to and from a bed to a chair (including a wheelchair)?: None Help needed standing up from a chair using your arms (e.g., wheelchair or bedside chair)?: None Help needed to walk in hospital room?: None Help needed climbing 3-5 steps with a railing? : A Little 6 Click Score: 23    End of Session Equipment Utilized During Treatment: Gait belt Activity Tolerance: Patient tolerated treatment well Patient left: in chair;with call bell/phone within reach;with chair alarm set;with family/visitor present Nurse Communication: Mobility status (Pt ready for D/C to home) PT Visit Diagnosis: Unsteadiness on feet (R26.81);Other abnormalities of gait and mobility (R26.89);Muscle weakness (generalized) (M62.81);Difficulty in walking, not elsewhere classified (R26.2);Pain Pain - Right/Left: Left Pain - part of body: Leg;Knee;Hip     Time: 1430-1455 PT Time Calculation (min) (ACUTE ONLY): 25 min  Charges:    $Gait Training: 8-22 mins $Therapeutic Activity: 8-22 mins PT General Charges $$ ACUTE PT VISIT: 1 Visit                    Felecia Shelling  PTA Acute  Rehabilitation Services Office M-F          [phone]

## 2023-04-25 NOTE — TOC Transition Note (Signed)
Transition of Care [facility]) - CM/SW Discharge Note   Patient Details  Name: [name] MRN: [id number] Date of Birth: 10-20-1945  Transition of [facility] CM/SW Contact:  Amada Jupiter, LCSW Phone Number: 04/25/2023, 10:27 AM   Clinical Narrative:    Met with pt and confirming she has received RW to room via [employer].  OPPT already arranged with [facility] in [place].  No further TOC needs.   Final next level of care: OP Rehab Barriers to Discharge: No Barriers Identified   Patient Goals and CMS Choice      Discharge Placement                         Discharge Plan and Services Additional resources added to the After Visit Summary for                  DME Arranged: Walker rolling DME Agency: [employer]                  Social Determinants of Health (SDOH) Interventions SDOH Screenings   Food Insecurity: No Food Insecurity (04/24/2023)  Housing: Low Risk  (04/24/2023)  Transportation Needs: No Transportation Needs (04/24/2023)  Utilities: Not At Risk (04/24/2023)  Depression (PHQ2-9): Low Risk  (10/04/2022)  Financial Resource Strain: Low Risk  (01/04/2023)  Physical Activity: Inactive (01/04/2023)  Social Connections: Moderately Integrated (01/04/2023)  Stress: No Stress Concern Present (01/04/2023)  Tobacco Use: Low Risk  (04/24/2023)     Readmission Risk Interventions     No data to display

## 2023-04-25 NOTE — Progress Notes (Signed)
Provided discharge education/instructions, all questions and concerns addressed. Meds delivered to room. Pt is not in any distress, discharged home with all of her belongings accompanied by husband.

## 2023-04-25 NOTE — Progress Notes (Signed)
Physical Therapy Treatment Patient Details Name: [name] MRN: [id number] DOB: April 19, 1946 Today's Date: 04/25/2023   History of Present Illness 77 yo female presents to therapy s/p L TKA on 04/24/2023 due to failure of conservative measures. Pt PMH includes but is not limited to: CKD III, L vocal fold paresis, DM II, dysphonia, R reverse TSA (2022), cervical DDD, LBP s/p laminectomy, RLS, GERD, sCHF, and gout.    PT Comments  POD # 1 am session Pt is AxO x 3 pleasant.  Assisted OOB.  General bed mobility comments: demonstarted and instructed how to use a belt to guide LE off bed.  General transfer comment: 50% VC's on proper hand placement and increased time.  General Gait Details: limited amb distance of 22 feet due to increased c/o nausea with activity.  Recliner following as a precaution.  Returned to room in recliner. Then returned to room to perform some TE's following HEP handout.  Instructed on proper tech, freq as well as use of ICE.   Will see pt again this afternoon.    If plan is discharge home, recommend the following: A little help with walking and/or transfers;A little help with bathing/dressing/bathroom;Assistance with cooking/housework;Assist for transportation;Help with stairs or ramp for entrance   Can travel by private vehicle        Equipment Recommendations  Rolling walker (2 wheels)    Recommendations for Other Services       Precautions / Restrictions Precautions Precaution Comments: no pillow inder knee Restrictions Weight Bearing Restrictions: No LLE Weight Bearing: Weight bearing as tolerated     Mobility  Bed Mobility Overal bed mobility: Needs Assistance Bed Mobility: Supine to Sit     Supine to sit: Supervision, Contact guard     General bed mobility comments: demonstarted and instructed how to use a belt to guide LE off bed    Transfers Overall transfer level: Needs assistance Equipment used: Rolling walker (2 wheels) Transfers: Sit  to/from Stand Sit to Stand: Min assist, From elevated surface           General transfer comment: 50% VC's on proper hand placement and increased time    Ambulation/Gait Ambulation/Gait assistance: Contact guard assist, Min assist Gait Distance (Feet): 22 Feet Assistive device: Rolling walker (2 wheels) Gait Pattern/deviations: Step-to pattern, Decreased stance time - left, Antalgic, Trunk flexed Gait velocity: decreased     General Gait Details: limited amb distance of 22 feet due to increased c/o nausea with activity.  Recliner following as a precaution.  Returned to room in recliner.   Stairs             Wheelchair Mobility     Tilt Bed    Modified Rankin (Stroke Patients Only)       Balance                                            Cognition Arousal: Alert Behavior During Therapy: WFL for tasks assessed/performed                                   General Comments: AxO x 3 pleasant        Exercises   Total Knee Replacement TE's following HEP handout 10 reps B LE ankle pumps 05 reps towel squeezes 05 reps knee presses 05  reps heel slides  05 reps SAQ's 05 reps SLR's 05 reps ABD Educated on use of gait belt to assist with TE's Followed by ICE    General Comments        Pertinent Vitals/Pain Pain Assessment Pain Assessment: 0-10 Pain Score: 7  Pain Location: L knee Pain Descriptors / Indicators: Aching, Burning, Constant, Discomfort, Dull, Grimacing, Operative site guarding Pain Intervention(s): Premedicated before session, Monitored during session, Repositioned, Ice applied    Home Living                          Prior Function            PT Goals (current goals can now be found in the care plan section) Progress towards PT goals: Progressing toward goals    Frequency    7X/week      PT Plan      Co-evaluation              AM-PAC PT "6 Clicks" Mobility   Outcome  Measure  Help needed turning from your back to your side while in a flat bed without using bedrails?: A Little Help needed moving from lying on your back to sitting on the side of a flat bed without using bedrails?: A Little Help needed moving to and from a bed to a chair (including a wheelchair)?: A Little Help needed standing up from a chair using your arms (e.g., wheelchair or bedside chair)?: A Little Help needed to walk in hospital room?: A Little Help needed climbing 3-5 steps with a railing? : A Lot 6 Click Score: 17    End of Session Equipment Utilized During Treatment: Gait belt Activity Tolerance: Patient limited by fatigue;Other (comment) (nausea) Patient left: in chair;with call bell/phone within reach;with chair alarm set;with family/visitor present Nurse Communication: Mobility status;Patient requests pain meds PT Visit Diagnosis: Unsteadiness on feet (R26.81);Other abnormalities of gait and mobility (R26.89);Muscle weakness (generalized) (M62.81);Difficulty in walking, not elsewhere classified (R26.2);Pain Pain - Right/Left: Left Pain - part of body: Leg;Knee;Hip     Time: 1003-1030 PT Time Calculation (min) (ACUTE ONLY): 27 min  Charges:    $Gait Training: 8-22 mins $Therapeutic Exercise: 8-22 mins PT General Charges $$ ACUTE PT VISIT: 1 Visit                     Felecia Shelling  PTA Acute  Rehabilitation Services Office M-F          [phone]

## 2023-04-25 NOTE — Care Management Obs Status (Signed)
MEDICARE OBSERVATION STATUS NOTIFICATION   Patient Details  Name: [name] MRN: [id number] Date of Birth: 11-02-1945   Medicare Observation Status Notification Given:  [name], LCSW 04/25/2023, 10:43 AM

## 2023-04-25 NOTE — Plan of Care (Signed)

## 2023-04-25 NOTE — Progress Notes (Signed)
Subjective: 1 Day Post-Op Procedure(s) (LRB): TOTAL KNEE ARTHROPLASTY (Left) Patient reports pain as mild.   Patient seen in rounds by Dr. Lequita Halt. Patient is well, and has had no acute complaints or problems No issues overnight. Denies chest pain, SOB, or calf pain. Foley catheter removed this AM.  We will continue therapy today, ambulated 12' yesterday.   Objective: Vital signs in last 24 hours: Temp:  [96.4 F (35.8 C)-98.1 F (36.7 C)] 97.6 F (36.4 C) (09/24 0740) Pulse Rate:  [51-67] 56 (09/24 0740) Resp:  [11-18] 16 (09/24 0740) BP: (125-163)/(56-64) 141/60 (09/24 0740) SpO2:  [92 %-100 %] 100 % (09/24 0740)  Intake/Output from previous day:  Intake/Output Summary (Last 24 hours) at 04/25/2023 0908 Last data filed at 04/25/2023 2841 Gross per 24 hour  Intake 2962.01 ml  Output 2355 ml  Net 607.01 ml     Intake/Output this shift: No intake/output data recorded.  Labs: Recent Labs    04/25/23 0336  HGB 10.7*   Recent Labs    04/25/23 0336  WBC 10.6*  RBC 3.85*  HCT 34.8*  PLT 229   Recent Labs    04/25/23 0336  NA 133*  K 5.1  CL 103  CO2 23  BUN 28*  CREATININE 1.12*  GLUCOSE 291*  CALCIUM 8.5*   No results for input(s): "LABPT", "INR" in the last 72 hours.  Exam: General - Patient is Alert and Oriented Extremity - Neurologically intact Neurovascular intact Sensation intact distally Dorsiflexion/Plantar flexion intact Dressing - dressing C/D/I Motor Function - intact, moving foot and toes well on exam.   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) 08/24/2020   Diabetes mellitus without complication (HCC)    takes Trulicity,Jardiance,and Metformin daily.Average fasting blood sugar runs around130   Dysrhythmia    PVCs, has MVP    Fibromyalgia    GERD (gastroesophageal reflux disease)    takes Omeprazole daily   Gout 07/19/2021   On allopurinol   History of bronchitis > 8 yrs ago   History of shingles    HTN (hypertension)    takes Amlodipine and Micardis daily   Hx of colonic polyps    benign   Internal and external hemorrhoids without complication    Joint pain    Mitral valve prolapse    Neuromuscular disorder (HCC)    restless leg syndrome   Nocturia    Pneumonia    PONV (postoperative nausea and vomiting)    when gets injections in joints gets hives.Betadine rash   Restless leg syndrome    takes Requip at bedtime   Seasonal allergies    takes Claritin daily as needed   Urinary frequency    Uterine fibroid     Assessment/Plan: 1 Day Post-Op Procedure(s) (LRB): TOTAL KNEE ARTHROPLASTY (Left) Principal Problem:   Osteoarthritis of left knee  Estimated body mass index is 33.13 kg/m as calculated from the following:   Height as of this encounter: 5\' 4"  (1.626 m).   Weight as of this encounter: 87.5 kg. Advance diet Up with therapy D/C IV fluids   Patient's anticipated LOS is less than 2 midnights, meeting these requirements: - Lives within 1 hour of care - Has a competent adult at home to recover with post-op recover -  NO history of  - Chronic pain requiring opiods  - Diabetes  - Coronary Artery Disease  - Heart failure  - Heart attack  - Stroke  - DVT/VTE  - Cardiac arrhythmia  - Respiratory Failure/COPD  - Renal failure  - Anemia  - Advanced Liver disease  DVT Prophylaxis - Aspirin Weight bearing as tolerated. Continue therapy.  Plan is to go Home after hospital stay. Plan for discharge later today if progresses with therapy and meeting goals. Scheduled for OPPT at [facility]). Follow-up in the office in 2 weeks.  The PDMP database was reviewed today prior to any opioid medications being prescribed to this patient.  Arther Abbott, PA-C Orthopedic Surgery [phone] 04/25/2023, 9:08 AM

## 2023-04-27 ENCOUNTER — Other Ambulatory Visit: Payer: Self-pay

## 2023-04-27 ENCOUNTER — Ambulatory Visit: Payer: Medicare Other | Attending: Student

## 2023-04-27 DIAGNOSIS — M25562 Pain in left knee: Secondary | ICD-10-CM | POA: Insufficient documentation

## 2023-04-27 DIAGNOSIS — M25662 Stiffness of left knee, not elsewhere classified: Secondary | ICD-10-CM | POA: Insufficient documentation

## 2023-04-27 DIAGNOSIS — R600 Localized edema: Secondary | ICD-10-CM | POA: Insufficient documentation

## 2023-04-27 NOTE — Therapy (Signed)
OUTPATIENT PHYSICAL THERAPY LOWER EXTREMITY EVALUATION   Patient Name: [name] MRN: [id number] DOB:1946/04/21, 77 y.o., female Today's Date: 04/27/2023  END OF SESSION:  PT End of Session - 04/27/23 1015     Visit Number 1    Number of Visits 12    Date for PT Re-Evaluation 06/30/23    PT Start Time 1016    PT Stop Time 1103    PT Time Calculation (min) 47 min    Activity Tolerance Patient limited by pain    Behavior During Therapy [facility] for tasks assessed/performed             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) 08/24/2020   Diabetes mellitus without complication (HCC)    takes Trulicity,Jardiance,and Metformin daily.Average fasting blood sugar runs around130   Dysrhythmia    PVCs, has MVP   Fibromyalgia    GERD (gastroesophageal reflux disease)    takes Omeprazole daily   Gout 07/19/2021   On allopurinol   History of bronchitis > 8 yrs ago   History of shingles    HTN (hypertension)    takes Amlodipine and Micardis daily   Hx of colonic polyps    benign   Internal and external hemorrhoids without complication    Joint pain    Mitral valve prolapse    Neuromuscular disorder (HCC)    restless leg syndrome   Nocturia    Pneumonia    PONV (postoperative nausea and vomiting)    when gets injections in joints gets hives.Betadine rash   Restless leg syndrome    takes Requip at bedtime   Seasonal allergies    takes Claritin daily as needed   Urinary frequency    Uterine fibroid    Past Surgical History:  Procedure Laterality Date   BUNIONECTOMY Bilateral    COLONOSCOPY  07/12/2004   diverticulosis, internal and external hemorrhoids   ETHMOIDECTOMY Bilateral 08/15/2022   Procedure: ETHMOIDECTOMY WITH TISSUE REMOVAL;  Surgeon: Newman Pies, MD;   Location: [facility];  Service: ENT;  Laterality: Bilateral;   FINGER ARTHROSCOPY WITH CARPOMETACARPEL (CMC) ARTHROPLASTY Right 09/28/2015   Procedure: RIGHT THUMB TRAPEZIUM EXCISION WITH CARPOMETACARPEL (CMC) ARTHROPLASTY AND TENDON TRANSFER;  Surgeon: Bradly Bienenstock, MD;  Location: [facility];  Service: Orthopedics;  Laterality: Right;   FRONTAL SINUS EXPLORATION Bilateral 08/15/2022   Procedure: FRONTAL RECESS SINUS EXPLORATION WITH NAVIGATION;  Surgeon: Newman Pies, MD;  Location: [facility];  Service: ENT;  Laterality: Bilateral;   HEMORRHOID SURGERY     almost 40 yrs ago   HYSTEROSCOPY WITH D & C  08/23/2000   and resectoscopic myomectomy   LUMBAR DISC SURGERY  03/16/2005   LUMBAR EPIDURAL INJECTION     MAXILLARY ANTROSTOMY Bilateral 08/15/2022   Procedure: ENDOSCOPIC MAXILLARY ANTROSTOMY WITH TISSUE REMOVAL;  Surgeon: Newman Pies, MD;  Location: [facility];  Service: ENT;  Laterality: Bilateral;   PLANTAR FASCIA RELEASE Left 02/03/2010   and torn tendon   REVERSE SHOULDER ARTHROPLASTY Right 12/14/2020   Procedure: REVERSE SHOULDER ARTHROPLASTY;  Surgeon: Beverely Low, MD;  Location: [facility];  Service: Orthopedics;  Laterality: Right;   RIGHT/LEFT HEART CATH AND CORONARY ANGIOGRAPHY N/A 09/07/2020   Procedure: RIGHT/LEFT HEART CATH AND CORONARY ANGIOGRAPHY;  Surgeon:  End, Cristal Deer, MD;  Location: [facility];  Service: Cardiovascular;  Laterality: N/A;   SINUS ENDO W/FUSION Bilateral 08/15/2022   Procedure: SPHENOIDECTOMY WITH TISSUE REMOVAL WITH NAVIGATION;  Surgeon: Newman Pies, MD;  Location: [facility];  Service: ENT;  Laterality: Bilateral;   TENDON TRANSFER Right 09/28/2015   Procedure: TENDON TRANSFER;  Surgeon: Bradly Bienenstock, MD;  Location: [facility];  Service: Orthopedics;  Laterality: Right;   TONSILLECTOMY     TOTAL KNEE ARTHROPLASTY Left 04/24/2023   Procedure: TOTAL KNEE ARTHROPLASTY;  Surgeon: Ollen Gross, MD;  Location:  [facility];  Service: Orthopedics;  Laterality: Left;   TUBAL LIGATION     TURBINATE REDUCTION Bilateral 08/15/2022   Procedure: TURBINATE REDUCTION;  Surgeon: Newman Pies, MD;  Location:  SUR[facility]ERY [facility];  Service: ENT;  Laterality: Bilateral;   WRIST SURGERY     left, removal of cyst   Patient Active Problem List   Diagnosis Date Noted   Screening for osteoporosis 10/04/2022   Stage 3a chronic kidney disease (HCC) 10/04/2022   Vocal fold paresis, left 04/21/2022   Laryngospasms 04/21/2022   Muscle tension dysphonia 04/21/2022   Old tear of meniscus of left knee 09/28/2021   Combined hyperlipidemia associated with type 2 diabetes mellitus (HCC) 09/28/2021   Gout 07/19/2021   S/P shoulder replacement, right 12/14/2020   Rotator cuff tear arthropathy 10/22/2020   Nonischemic cardiomyopathy (HCC) 10/06/2020   Congestive heart failure with left ventricular systolic dysfunction (HCC) 08/24/2020   Primary osteoarthritis of left shoulder 07/29/2020   DDD (degenerative disc disease), cervical 04/30/2020   Osteoarthritis of left knee 11/05/2018   Chronic pansinusitis 10/04/2017   Chronic low back pain 09/05/2017   Degenerative lumbar disc 06/04/2015   S/P lumbar laminectomy 06/04/2015   Restless leg syndrome 05/13/2014   Type 2 diabetes mellitus with peripheral neuropathy (HCC) 04/09/2013   Spinal stenosis of lumbar region without neurogenic claudication 09/12/2012   GERD (gastroesophageal reflux disease) 05/25/2011   Hemorrhoids, internal, with bleeding 05/25/2011   Hypertension associated with diabetes (HCC) 11/08/2010   Chronic allergic rhinitis 08/19/2010   Mitral valve prolapse 10/10/2008   REFERRING PROVIDER: Eartha Inch, PA   REFERRING DIAG: Unilateral primary osteoarthritis, left knee   THERAPY DIAG:  Acute pain of left knee  Stiffness of left knee, not elsewhere classified  Localized edema  Rationale for Evaluation and Treatment: Rehabilitation  ONSET DATE:  04/24/23  SUBJECTIVE:   SUBJECTIVE STATEMENT: Patient reports that she had her left knee replaced on 04/24/23. She notes that it has been "pretty rough" since surgery. She notes that her pain is primarily in her knee, but it can go up her back if she is sitting wrong. She has to have help getting into and out of her bed, but she is able to sleep in her own bed.   PERTINENT HISTORY: Allergies, hypertension, diabetes, congestive heart failure, osteoarthritis, chronic low back pain, chronic kidney disease, anxiety, claustrophobia, and fibromyalgia PAIN:  Are you having pain? Yes: NPRS scale: 8/10 Pain location: left knee Pain description: constant aching, throbbing, and burning  Aggravating factors: standing, walking, and moving her knee  Relieving factors: ice and medication   PRECAUTIONS: None  RED FLAGS: None   WEIGHT BEARING RESTRICTIONS: No  FALLS:  Has patient fallen in last 6 months? No  LIVING ENVIRONMENT: Lives with: lives with their family Lives in: House/apartment Stairs: Yes: Internal: 14 steps; on right going up and External: 1 steps; none Has following equipment at home: Dan Humphreys -  2 wheeled  OCCUPATION: retired  PLOF: Independent  PATIENT GOALS: be able to paint the last two rooms of her house, be able to do her yard work, reduced pain, and improved mobility  NEXT MD VISIT: 05/10/23  OBJECTIVE:   PATIENT SURVEYS:  FOTO 12.20  COGNITION: Overall cognitive status: Within functional limits for tasks assessed     SENSATION: Patient reports no numbness or tingling  EDEMA:  Circumferential: L tibiofemoral joint line: 53.3 cn R tibiofemoral joint line: 49.0 cm  POSTURE: forward head and flexed trunk   PALPATION: TTP: left quadriceps, hip adductors, hamstrings, medial and lateral tibiofemoral joint lines  LOWER EXTREMITY ROM:  Active ROM Right eval Left eval  Hip flexion    Hip extension    Hip abduction    Hip adduction    Hip internal rotation     Hip external rotation    Knee flexion 133 39/ 43 (PROM)  Knee extension 0 20  Ankle dorsiflexion    Ankle plantarflexion    Ankle inversion    Ankle eversion     (Blank rows = not tested)  LOWER EXTREMITY MMT: not assessed due to surgical condition  LOWER EXTREMITY SPECIAL TESTS:  Not tested due to surgical condition  GAIT: Assistive device utilized: Environmental consultant - 2 wheeled and Wheelchair (manual) Level of assistance: Modified independence and Total A Comments: Patient utilized a shuffling pattern with the FWW, but utilized for long distance mobility    TODAY'S TREATMENT:                                                                                                                              DATE:   Modalities: no adverse reaction to today's modalities  Date:  Vaso: Knee, 34 degrees; low pressure, 15 mins, Pain and Edema  PATIENT EDUCATION:  Education details: HEP, plan of care, prognosis, healing, and goals for therapy Person educated: Patient Education method: Explanation Education comprehension: verbalized understanding  HOME EXERCISE PROGRAM: [id number]  ASSESSMENT:  CLINICAL IMPRESSION: Patient is a 77 y.o. female who was seen today for physical therapy evaluation and treatment following a left total knee arthroplasty on 04/24/23. She presented with high pain severity and irritability with ambulation and left knee active ROM being the most aggravating to her familiar symptoms. Due to these high pain levels, her left knee active and passive range of motion was significantly limited. However, she exhibited no signs or symptoms of a DVT or other postoperative complication. Recommend that she continue with skilled physical therapy to address her impairments to return to her prior level of function.  OBJECTIVE IMPAIRMENTS: Abnormal gait, decreased activity tolerance, decreased mobility, difficulty walking, decreased ROM, decreased strength, hypomobility, increased edema,  impaired tone, and pain.   ACTIVITY LIMITATIONS: carrying, lifting, sitting, standing, squatting, stairs, transfers, bed mobility, bathing, dressing, locomotion level, and caring for others  PARTICIPATION LIMITATIONS: meal prep, cleaning, laundry, driving, shopping, community activity, and yard work  PERSONAL FACTORS: Past/current  experiences, Transportation, and 3+ comorbidities: Allergies, hypertension, diabetes, congestive heart failure, osteoarthritis, chronic low back pain, chronic kidney disease, anxiety, claustrophobia, and fibromyalgia  are also affecting patient's functional outcome.   REHAB POTENTIAL: Good  CLINICAL DECISION MAKING: Evolving/moderate complexity  EVALUATION COMPLEXITY: Moderate   GOALS: Goals reviewed with patient? Yes  SHORT TERM GOALS: Target date: 05/18/23 Patient will be independent with her initial HEP. Baseline: Goal status: INITIAL  2.  Patient will be able to demonstrate active left knee extension within 10 degrees of neutral for improved knee mobility.  Baseline:  Goal status: INITIAL  3.  Patient will be able to demonstrate active left knee flexion to at least 90 degrees for improved function with transfers. Baseline:  Goal status: INITIAL  4.  Patient will be able to safely ambulate community distances with a walker with the least restrictive assistive device for improved community mobility. Baseline:  Goal status: INITIAL  LONG TERM GOALS: Target date: 06/08/23  Patient will be independent with her advanced HEP. Baseline:  Goal status: INITIAL  2.  Patient will be able to demonstrate active left knee extension within 5 degrees of neutral for improved gait mechanics. Baseline:  Goal status: INITIAL  3.  Patient will improve her active left knee flexion to at least 120 degrees for improved function navigating stairs. Baseline:  Goal status: INITIAL  4.  Patient will be able to safely ambulate with minimal to no gait deviations for  improved mobility. Baseline:  Goal status: INITIAL  5.  Patient will be able to transfer from sitting to standing with minimal to no difficulty for improved household mobility. Baseline:  Goal status: INITIAL  PLAN:  PT FREQUENCY: 2-3x/week  PT DURATION: 6 weeks  PLANNED INTERVENTIONS: Therapeutic exercises, Therapeutic activity, Neuromuscular re-education, Balance training, Gait training, Patient/Family education, Self Care, Joint mobilization, Stair training, Electrical stimulation, Cryotherapy, Moist heat, Vasopneumatic device, Manual therapy, and Re-evaluation  PLAN FOR NEXT SESSION: Nustep, review and update HEP as needed, manual therapy, gait training, and modalities as needed   Granville Lewis, PT 04/27/2023, 6:01 PM

## 2023-05-02 ENCOUNTER — Ambulatory Visit: Payer: Medicare Other | Admitting: Cardiology

## 2023-05-02 ENCOUNTER — Other Ambulatory Visit: Payer: Self-pay | Admitting: Family Medicine

## 2023-05-02 ENCOUNTER — Ambulatory Visit: Payer: Medicare Other | Attending: Student

## 2023-05-02 DIAGNOSIS — M25662 Stiffness of left knee, not elsewhere classified: Secondary | ICD-10-CM | POA: Insufficient documentation

## 2023-05-02 DIAGNOSIS — M25562 Pain in left knee: Secondary | ICD-10-CM | POA: Diagnosis present

## 2023-05-02 DIAGNOSIS — R600 Localized edema: Secondary | ICD-10-CM | POA: Insufficient documentation

## 2023-05-02 NOTE — Therapy (Signed)
OUTPATIENT PHYSICAL THERAPY LOWER EXTREMITY TREATMENT   Patient Name: [name] MRN: [id number] DOB:1945-12-02, 77 y.o., female Today's Date: 05/02/2023  END OF SESSION:  PT End of Session - 05/02/23 0813     Visit Number 2    Number of Visits 12    Date for PT Re-Evaluation 06/30/23    PT Start Time 0800    PT Stop Time 0845    PT Time Calculation (min) 45 min    Activity Tolerance Patient tolerated treatment well    Behavior During Therapy [facility] for tasks assessed/performed              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) 08/24/2020   Diabetes mellitus without complication (HCC)    takes Trulicity,Jardiance,and Metformin daily.Average fasting blood sugar runs around130   Dysrhythmia    PVCs, has MVP   Fibromyalgia    GERD (gastroesophageal reflux disease)    takes Omeprazole daily   Gout 07/19/2021   On allopurinol   History of bronchitis > 8 yrs ago   History of shingles    HTN (hypertension)    takes Amlodipine and Micardis daily   Hx of colonic polyps    benign   Internal and external hemorrhoids without complication    Joint pain    Mitral valve prolapse    Neuromuscular disorder (HCC)    restless leg syndrome   Nocturia    Pneumonia    PONV (postoperative nausea and vomiting)    when gets injections in joints gets hives.Betadine rash   Restless leg syndrome    takes Requip at bedtime   Seasonal allergies    takes Claritin daily as needed   Urinary frequency    Uterine fibroid    Past Surgical History:  Procedure Laterality Date   BUNIONECTOMY Bilateral    COLONOSCOPY  07/12/2004   diverticulosis, internal and external hemorrhoids   ETHMOIDECTOMY Bilateral 08/15/2022   Procedure: ETHMOIDECTOMY WITH TISSUE REMOVAL;  Surgeon:  Newman Pies, MD;  Location: [facility];  Service: ENT;  Laterality: Bilateral;   FINGER ARTHROSCOPY WITH CARPOMETACARPEL (CMC) ARTHROPLASTY Right 09/28/2015   Procedure: RIGHT THUMB TRAPEZIUM EXCISION WITH CARPOMETACARPEL (CMC) ARTHROPLASTY AND TENDON TRANSFER;  Surgeon: Bradly Bienenstock, MD;  Location: [facility];  Service: Orthopedics;  Laterality: Right;   FRONTAL SINUS EXPLORATION Bilateral 08/15/2022   Procedure: FRONTAL RECESS SINUS EXPLORATION WITH NAVIGATION;  Surgeon: Newman Pies, MD;  Location: [facility];  Service: ENT;  Laterality: Bilateral;   HEMORRHOID SURGERY     almost 40 yrs ago   HYSTEROSCOPY WITH D & C  08/23/2000   and resectoscopic myomectomy   LUMBAR DISC SURGERY  03/16/2005   LUMBAR EPIDURAL INJECTION     MAXILLARY ANTROSTOMY Bilateral 08/15/2022   Procedure: ENDOSCOPIC MAXILLARY ANTROSTOMY WITH TISSUE REMOVAL;  Surgeon: Newman Pies, MD;  Location: [facility];  Service: ENT;  Laterality: Bilateral;   PLANTAR FASCIA RELEASE Left 02/03/2010   and torn tendon   REVERSE SHOULDER ARTHROPLASTY Right 12/14/2020   Procedure: REVERSE SHOULDER ARTHROPLASTY;  Surgeon: Beverely Low, MD;  Location: [facility];  Service: Orthopedics;  Laterality: Right;   RIGHT/LEFT HEART CATH AND CORONARY ANGIOGRAPHY N/A 09/07/2020   Procedure: RIGHT/LEFT HEART CATH AND CORONARY ANGIOGRAPHY;  Surgeon: Yvonne Kendall, MD;  Location: [facility];  Service: Cardiovascular;  Laterality: N/A;   SINUS ENDO W/FUSION Bilateral 08/15/2022   Procedure: SPHENOIDECTOMY WITH TISSUE REMOVAL WITH NAVIGATION;  Surgeon: Newman Pies, MD;  Location: [facility];  Service: ENT;  Laterality: Bilateral;   TENDON TRANSFER Right 09/28/2015   Procedure: TENDON TRANSFER;  Surgeon: Bradly Bienenstock, MD;  Location: [facility];  Service: Orthopedics;  Laterality: Right;   TONSILLECTOMY     TOTAL KNEE ARTHROPLASTY Left 04/24/2023   Procedure: TOTAL KNEE ARTHROPLASTY;  Surgeon: Ollen Gross,  MD;  Location: [facility];  Service: Orthopedics;  Laterality: Left;   TUBAL LIGATION     TURBINATE REDUCTION Bilateral 08/15/2022   Procedure: TURBINATE REDUCTION;  Surgeon: Newman Pies, MD;  Location: [facility];  Service: ENT;  Laterality: Bilateral;   WRIST SURGERY     left, removal of cyst   Patient Active Problem List   Diagnosis Date Noted   Screening for osteoporosis 10/04/2022   Stage 3a chronic kidney disease (HCC) 10/04/2022   Vocal fold paresis, left 04/21/2022   Laryngospasms 04/21/2022   Muscle tension dysphonia 04/21/2022   Old tear of meniscus of left knee 09/28/2021   Combined hyperlipidemia associated with type 2 diabetes mellitus (HCC) 09/28/2021   Gout 07/19/2021   S/P shoulder replacement, right 12/14/2020   Rotator cuff tear arthropathy 10/22/2020   Nonischemic cardiomyopathy (HCC) 10/06/2020   Congestive heart failure with left ventricular systolic dysfunction (HCC) 08/24/2020   Primary osteoarthritis of left shoulder 07/29/2020   DDD (degenerative disc disease), cervical 04/30/2020   Osteoarthritis of left knee 11/05/2018   Chronic pansinusitis 10/04/2017   Chronic low back pain 09/05/2017   Degenerative lumbar disc 06/04/2015   S/P lumbar laminectomy 06/04/2015   Restless leg syndrome 05/13/2014   Type 2 diabetes mellitus with peripheral neuropathy (HCC) 04/09/2013   Spinal stenosis of lumbar region without neurogenic claudication 09/12/2012   GERD (gastroesophageal reflux disease) 05/25/2011   Hemorrhoids, internal, with bleeding 05/25/2011   Hypertension associated with diabetes (HCC) 11/08/2010   Chronic allergic rhinitis 08/19/2010   Mitral valve prolapse 10/10/2008   REFERRING PROVIDER: Eartha Inch, PA   REFERRING DIAG: Unilateral primary osteoarthritis, left knee   THERAPY DIAG:  Acute pain of left knee  Stiffness of left knee, not elsewhere classified  Localized edema  Rationale for Evaluation and Treatment:  Rehabilitation  ONSET DATE: 04/24/23  SUBJECTIVE:   SUBJECTIVE STATEMENT: Patient reports that her knee is hurting quite a bit today.   PERTINENT HISTORY: Allergies, hypertension, diabetes, congestive heart failure, osteoarthritis, chronic low back pain, chronic kidney disease, anxiety, claustrophobia, and fibromyalgia PAIN:  Are you having pain? Yes: NPRS scale: 8/10 Pain location: left knee Pain description: constant aching, throbbing, and burning  Aggravating factors: standing, walking, and moving her knee  Relieving factors: ice and medication   PRECAUTIONS: None  RED FLAGS: None   WEIGHT BEARING RESTRICTIONS: No  FALLS:  Has patient fallen in last 6 months? No  LIVING ENVIRONMENT: Lives with: lives with their family Lives in: House/apartment Stairs: Yes: Internal: 14 steps; on right going up and External: 1 steps; none Has following equipment at home: Walker - 2 wheeled  OCCUPATION: retired  PLOF: Independent  PATIENT GOALS: be able to paint the last two rooms of her house, be able to do her yard work, reduced pain, and improved mobility  NEXT MD VISIT: 05/10/23  OBJECTIVE: all objective measures were assessed at her initial evaluation on 04/27/23 unless  otherwise noted  PATIENT SURVEYS:  FOTO 12.20  COGNITION: Overall cognitive status: Within functional limits for tasks assessed     SENSATION: Patient reports no numbness or tingling  EDEMA:  Circumferential: L tibiofemoral joint line: 53.3 cn R tibiofemoral joint line: 49.0 cm  POSTURE: forward head and flexed trunk   PALPATION: TTP: left quadriceps, hip adductors, hamstrings, medial and lateral tibiofemoral joint lines  LOWER EXTREMITY ROM:  Active ROM Right eval Left eval  Hip flexion    Hip extension    Hip abduction    Hip adduction    Hip internal rotation    Hip external rotation    Knee flexion 133 39/ 43 (PROM)  Knee extension 0 20  Ankle dorsiflexion    Ankle plantarflexion     Ankle inversion    Ankle eversion     (Blank rows = not tested)  LOWER EXTREMITY MMT: not assessed due to surgical condition  LOWER EXTREMITY SPECIAL TESTS:  Not tested due to surgical condition  GAIT: Assistive device utilized: Environmental consultant - 2 wheeled and Wheelchair (manual) Level of assistance: Modified independence and Total A Comments: Patient utilized a shuffling pattern with the FWW, but utilized for long distance mobility    TODAY'S TREATMENT:                                                                                                                              DATE:                                    05/02/23 EXERCISE LOG  Exercise Repetitions and Resistance Comments  Nustep  L1 x 14 minutes; seat 10   Seated ankle pump  30 reps  LLE only   Seated heel slide 30 reps  LLE only   Seated quad set  1.5 minutes w/ 5 second hold  LLE only        Blank cell = exercise not performed today  Modalities: no redness or adverse reaction to today's modalities   Date:  Vaso: Knee, 34 degrees; low pressure, 13 mins, Pain and Edema   PATIENT EDUCATION:  Education details: HEP, plan of care, prognosis, healing, and goals for therapy Person educated: Patient Education method: Explanation Education comprehension: verbalized understanding  HOME EXERCISE PROGRAM: [id number]  ASSESSMENT:  CLINICAL IMPRESSION: Patient was introduced to multiple new seated interventions for improved knee mobility. She required minimal cueing with today's interventions for proper positioning to facilitate appropriate muscular engagement. She was limited by by fatigue and soreness with today's interventions as she required brief rest breaks throughout treatment. She reported that her knee felt better upon the conclusion of treatment. She continues to require skilled physical therapy to address her remaining impairments to return to her prior level of function.   OBJECTIVE IMPAIRMENTS: Abnormal gait,  decreased activity tolerance, decreased mobility, difficulty walking, decreased ROM, decreased strength,  hypomobility, increased edema, impaired tone, and pain.   ACTIVITY LIMITATIONS: carrying, lifting, sitting, standing, squatting, stairs, transfers, bed mobility, bathing, dressing, locomotion level, and caring for others  PARTICIPATION LIMITATIONS: meal prep, cleaning, laundry, driving, shopping, community activity, and yard work  PERSONAL FACTORS: Past/current experiences, Transportation, and 3+ comorbidities: Allergies, hypertension, diabetes, congestive heart failure, osteoarthritis, chronic low back pain, chronic kidney disease, anxiety, claustrophobia, and fibromyalgia  are also affecting patient's functional outcome.   REHAB POTENTIAL: Good  CLINICAL DECISION MAKING: Evolving/moderate complexity  EVALUATION COMPLEXITY: Moderate   GOALS: Goals reviewed with patient? Yes  SHORT TERM GOALS: Target date: 05/18/23 Patient will be independent with her initial HEP. Baseline: Goal status: INITIAL  2.  Patient will be able to demonstrate active left knee extension within 10 degrees of neutral for improved knee mobility.  Baseline:  Goal status: INITIAL  3.  Patient will be able to demonstrate active left knee flexion to at least 90 degrees for improved function with transfers. Baseline:  Goal status: INITIAL  4.  Patient will be able to safely ambulate community distances with a walker with the least restrictive assistive device for improved community mobility. Baseline:  Goal status: INITIAL  LONG TERM GOALS: Target date: 06/08/23  Patient will be independent with her advanced HEP. Baseline:  Goal status: INITIAL  2.  Patient will be able to demonstrate active left knee extension within 5 degrees of neutral for improved gait mechanics. Baseline:  Goal status: INITIAL  3.  Patient will improve her active left knee flexion to at least 120 degrees for improved function  navigating stairs. Baseline:  Goal status: INITIAL  4.  Patient will be able to safely ambulate with minimal to no gait deviations for improved mobility. Baseline:  Goal status: INITIAL  5.  Patient will be able to transfer from sitting to standing with minimal to no difficulty for improved household mobility. Baseline:  Goal status: INITIAL  PLAN:  PT FREQUENCY: 2-3x/week  PT DURATION: 6 weeks  PLANNED INTERVENTIONS: Therapeutic exercises, Therapeutic activity, Neuromuscular re-education, Balance training, Gait training, Patient/Family education, Self Care, Joint mobilization, Stair training, Electrical stimulation, Cryotherapy, Moist heat, Vasopneumatic device, Manual therapy, and Re-evaluation  PLAN FOR NEXT SESSION: Nustep, review and update HEP as needed, manual therapy, gait training, and modalities as needed   Granville Lewis, PT 05/02/2023, 9:38 AM

## 2023-05-02 NOTE — Discharge Summary (Signed)
Patient ID: [name] MRN: [id number] DOB/AGE: 77-Oct-1947 77 y.o.  Admit date: 04/24/2023 Discharge date: 04/25/2023  Admission Diagnoses:  Principal Problem:   Osteoarthritis of left knee   Discharge Diagnoses:  Same  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) 08/24/2020   Diabetes mellitus without complication (HCC)    takes Trulicity,Jardiance,and Metformin daily.Average fasting blood sugar runs around130   Dysrhythmia    PVCs, has MVP   Fibromyalgia    GERD (gastroesophageal reflux disease)    takes Omeprazole daily   Gout 07/19/2021   On allopurinol   History of bronchitis > 8 yrs ago   History of shingles    HTN (hypertension)    takes Amlodipine and Micardis daily   Hx of colonic polyps    benign   Internal and external hemorrhoids without complication    Joint pain    Mitral valve prolapse    Neuromuscular disorder (HCC)    restless leg syndrome   Nocturia    Pneumonia    PONV (postoperative nausea and vomiting)    when gets injections in joints gets hives.Betadine rash   Restless leg syndrome    takes Requip at bedtime   Seasonal allergies    takes Claritin daily as needed   Urinary frequency    Uterine fibroid     Surgeries: Procedure(s): TOTAL KNEE ARTHROPLASTY on 04/24/2023   Consultants:   Discharged Condition: Improved  Hospital Course: DARBI WOHLERS is an 77 y.o. female who was admitted 04/24/2023 for operative treatment ofOsteoarthritis of left knee. Patient has severe unremitting pain that affects sleep, daily activities, and work/hobbies. After pre-op clearance the patient was taken to the operating room on 04/24/2023 and underwent  Procedure(s): TOTAL KNEE ARTHROPLASTY.    Patient was given perioperative antibiotics:   Anti-infectives (From admission, onward)    Start     Dose/Rate Route Frequency Ordered Stop   04/24/23 1630  ceFAZolin (ANCEF) IVPB 2g/100 mL premix        2 g 200 mL/hr over 30 Minutes Intravenous Every 6 hours 04/24/23 1112 04/24/23 2307   04/24/23 0630  ceFAZolin (ANCEF) IVPB 2g/100 mL premix        2 g 200 mL/hr over 30 Minutes Intravenous On call to O.R. 04/24/23 4098 04/24/23 0856        Patient was given sequential compression devices, early ambulation, and chemoprophylaxis to prevent DVT.  Patient benefited maximally from hospital stay and there were no complications.    Recent vital signs: No data found.   Recent laboratory studies: No results for input(s): "WBC", "HGB", "HCT", "PLT", "NA", "K", "CL", "CO2", "BUN", "CREATININE", "GLUCOSE", "INR", "CALCIUM" in the last 72 hours.  Invalid input(s): "PT", "2"   Discharge Medications:   Allergies as of 04/25/2023       Reactions   Amlodipine Swelling   Amoxicillin-pot Clavulanate Hives   Depo-medrol [methylprednisolone] Hives   Gabapentin Rash   hives   Levothyroxine Other (See Comments)   severe muscle pain in neck   Sitagliptin Other (See Comments)   Urinary hesitancy Januvia   Amoxicillin Hives   Codeine Nausea And Vomiting   Corticosteroids Hives   Steroid injections   Tetracyclines & Related Nausea And Vomiting   Ace Inhibitors  cough   Dexamethasone Rash        Medication List     STOP taking these medications    ibuprofen 200 MG tablet Commonly known as: ADVIL       TAKE these medications    allopurinol 100 MG tablet Commonly known as: ZYLOPRIM TAKE 1 TABLET BY MOUTH EVERY DAY   Aspirin Low Dose 81 MG chewable tablet Generic drug: aspirin Chew 1 tablet (81 mg total) by mouth 2 (two) times daily for 20 days. Then take one 81 mg aspirin once a day for three weeks. Then discontinue aspirin.   aspirin EC 81 MG tablet Commonly known as: Aspirin 81 Take 1 tablet by mouth twice a day  for three weeks following surgery. Then take an 81 mg aspirin once a day for three weeks. Then discontinue.   Assure ID Safety Pen Needles 30G X 5 MM Misc Generic drug: Insulin Pen Needle USE AS DIRECTED   CAL-MAG-ZINC PO Take 1 tablet by mouth in the morning. Notes to patient: Resume home regimen   carvedilol 3.125 MG tablet Commonly known as: COREG Take 1 tablet (3.125 mg total) by mouth 2 (two) times daily.   cholecalciferol 1000 units tablet Commonly known as: VITAMIN D Take 1,000 Units by mouth in the morning. Notes to patient: Resume home regimen   dapagliflozin propanediol 10 MG Tabs tablet Commonly known as: Farxiga Take 1 tablet (10 mg total) by mouth daily before breakfast.   Entresto 49-51 MG Generic drug: sacubitril-valsartan Take 1 tablet by mouth 2 (two) times daily.   famotidine 20 MG tablet Commonly known as: PEPCID Take 20 mg by mouth at bedtime.   fluticasone 50 MCG/ACT nasal spray Commonly known as: FLONASE SPRAY 1 SPRAY INTO BOTH NOSTRILS DAILY.   furosemide 20 MG tablet Commonly known as: LASIX Take 1 tablet (20 mg total) by mouth as directed. Monday,wed, Friday and sat only. Notes to patient: Resume home regimen   HYDROmorphone 2 MG tablet Commonly known as: DILAUDID Take 1-2 tablets (2-4 mg total) by mouth every 6 (six) hours as needed for severe pain. Notes to patient: Last dose given 09/24 03:55am   HYDROmorphone 2 MG tablet Commonly known as: DILAUDID Take 1-2 tablets (2-4 mg total) by mouth every 6 (six) hours as needed for severe pain   Lantus SoloStar 100 UNIT/ML Solostar Pen Generic drug: insulin glargine Inject 20 Units into the skin at bedtime.   metFORMIN 500 MG tablet Commonly known as: GLUCOPHAGE Take 1 tablet (500 mg total) by mouth 2 (two) times daily with a meal. Notes to patient: Resume home regimen   methocarbamol 500 MG tablet Commonly known as: ROBAXIN Take 1 tablet (500 mg total) by mouth every 6 (six) hours as  needed for muscle spasms. Notes to patient: Last dose given 09/24 12:45am   montelukast 10 MG tablet Commonly known as: SINGULAIR TAKE 1 TABLET BY MOUTH EVERY DAY   multivitamin with minerals Tabs tablet Take 1 tablet by mouth in the morning. Notes to patient: Resume home regimen   omeprazole 40 MG capsule Commonly known as: PRILOSEC TAKE 1 CAPSULE (40 MG TOTAL) BY MOUTH DAILY.   ondansetron 4 MG tablet Commonly known as: ZOFRAN Take 1 tablet (4 mg total) by mouth every 6 (six) hours as needed for nausea. Notes to patient: Last dose given 09/23 08:28am   pregabalin 100 MG capsule Commonly known as: LYRICA Take 1 capsule by mouth twice daily   Refresh Optive Mega-3 0.5-1-0.5 % Soln Generic drug:  Carboxymeth-Glyc-Polysorb PF Place 1-2 drops into both eyes 3 (three) times daily as needed (dry/irritated eyes.). Notes to patient: Resume home regimen   rOPINIRole 3 MG tablet Commonly known as: REQUIP TAKE 1 TABLET BY MOUTH AT BEDTIME.   rosuvastatin 20 MG tablet Commonly known as: CRESTOR Take 1 tablet (20 mg total) by mouth daily. What changed: when to take this   spironolactone 25 MG tablet Commonly known as: ALDACTONE Take 0.5 tablets (12.5 mg total) by mouth daily.   traMADol 50 MG tablet Commonly known as: ULTRAM Take 1-2 tablets (50-100 mg total) by mouth every 6 (six) hours as needed for moderate pain. Notes to patient: Last dose given 09/24 11:09am   VITAMIN B-12 PO Take 1 tablet by mouth in the morning. Notes to patient: Resume home regimen               Discharge Care Instructions  (From admission, onward)           Start     Ordered   04/25/23 0000  Weight bearing as tolerated        04/25/23 0911   04/25/23 0000  Change dressing       Comments: You may remove the bulky bandage (ACE wrap and gauze) two days after surgery. You will have an adhesive waterproof bandage underneath. Leave this in place until your first follow-up appointment.    04/25/23 0911            Diagnostic Studies: No results found.  Disposition: Discharge disposition: 01-Home or Self Care       Discharge Instructions     Call MD / Call 911   Complete by: As directed    If you experience chest pain or shortness of breath, CALL 911 and be transported to the hospital emergency room.  If you develope a fever above 101 F, pus (white drainage) or increased drainage or redness at the wound, or calf pain, call your surgeon's office.   Change dressing   Complete by: As directed    You may remove the bulky bandage (ACE wrap and gauze) two days after surgery. You will have an adhesive waterproof bandage underneath. Leave this in place until your first follow-up appointment.   Constipation Prevention   Complete by: As directed    Drink plenty of fluids.  Prune juice may be helpful.  You may use a stool softener, such as Colace (over the counter) 100 mg twice a day.  Use MiraLax (over the counter) for constipation as needed.   Diet - low sodium heart healthy   Complete by: As directed    Do not put a pillow under the knee. Place it under the heel.   Complete by: As directed    Driving restrictions   Complete by: As directed    No driving for two weeks   Post-operative opioid taper instructions:   Complete by: As directed    POST-OPERATIVE OPIOID TAPER INSTRUCTIONS: It is important to wean off of your opioid medication as soon as possible. If you do not need pain medication after your surgery it is ok to stop day one. Opioids include: Codeine, Hydrocodone(Norco, Vicodin), Oxycodone(Percocet, oxycontin) and hydromorphone amongst others.  Long term and even short term use of opiods can cause: Increased pain response Dependence Constipation Depression Respiratory depression And more.  Withdrawal symptoms can include Flu like symptoms Nausea, vomiting And more Techniques to manage these symptoms Hydrate well Eat regular healthy meals Stay  active Use relaxation techniques(deep  breathing, meditating, yoga) Do Not substitute Alcohol to help with tapering If you have been on opioids for less than two weeks and do not have pain than it is ok to stop all together.  Plan to wean off of opioids This plan should start within one week post op of your joint replacement. Maintain the same interval or time between taking each dose and first decrease the dose.  Cut the total daily intake of opioids by one tablet each day Next start to increase the time between doses. The last dose that should be eliminated is the evening dose.      TED hose   Complete by: As directed    Use stockings (TED hose) for three weeks on both leg(s).  You may remove them at night for sleeping.   Weight bearing as tolerated   Complete by: As directed         Follow-up Information     Aluisio, Homero Fellers, MD. Schedule an appointment as soon as possible for a visit in 2 week(s).   Specialty: Orthopedic Surgery Contact information: [address] [place] [address] [place] [address] [phone]                  Signed: Arther Abbott 05/02/2023, 7:57 AM

## 2023-05-03 ENCOUNTER — Other Ambulatory Visit (HOSPITAL_BASED_OUTPATIENT_CLINIC_OR_DEPARTMENT_OTHER): Payer: Self-pay | Admitting: *Deleted

## 2023-05-03 MED ORDER — SPIRONOLACTONE 25 MG PO TABS: 12.5000 mg | ORAL_TABLET | Freq: Every day | ORAL | 2 refills | Status: DC

## 2023-05-04 ENCOUNTER — Ambulatory Visit: Payer: Medicare Other

## 2023-05-04 DIAGNOSIS — M25662 Stiffness of left knee, not elsewhere classified: Secondary | ICD-10-CM

## 2023-05-04 DIAGNOSIS — M25562 Pain in left knee: Secondary | ICD-10-CM | POA: Diagnosis not present

## 2023-05-04 DIAGNOSIS — R600 Localized edema: Secondary | ICD-10-CM

## 2023-05-04 NOTE — Therapy (Signed)
OUTPATIENT PHYSICAL THERAPY LOWER EXTREMITY TREATMENT   Patient Name: [name] MRN: [id number] DOB:04/18/1946, 77 y.o., female Today's Date: 05/04/2023  END OF SESSION:  PT End of Session - 05/04/23 0836     Visit Number 3    Number of Visits 12    Date for PT Re-Evaluation 06/30/23    PT Start Time 0835    Activity Tolerance Patient tolerated treatment well    Behavior During Therapy [facility] for tasks assessed/performed              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) 08/24/2020   Diabetes mellitus without complication (HCC)    takes Trulicity,Jardiance,and Metformin daily.Average fasting blood sugar runs around130   Dysrhythmia    PVCs, has MVP   Fibromyalgia    GERD (gastroesophageal reflux disease)    takes Omeprazole daily   Gout 07/19/2021   On allopurinol   History of bronchitis > 8 yrs ago   History of shingles    HTN (hypertension)    takes Amlodipine and Micardis daily   Hx of colonic polyps    benign   Internal and external hemorrhoids without complication    Joint pain    Mitral valve prolapse    Neuromuscular disorder (HCC)    restless leg syndrome   Nocturia    Pneumonia    PONV (postoperative nausea and vomiting)    when gets injections in joints gets hives.Betadine rash   Restless leg syndrome    takes Requip at bedtime   Seasonal allergies    takes Claritin daily as needed   Urinary frequency    Uterine fibroid    Past Surgical History:  Procedure Laterality Date   BUNIONECTOMY Bilateral    COLONOSCOPY  07/12/2004   diverticulosis, internal and external hemorrhoids   ETHMOIDECTOMY Bilateral 08/15/2022   Procedure: ETHMOIDECTOMY WITH TISSUE REMOVAL;  Surgeon: Newman Pies, MD;  Location: [facility];  Service:  ENT;  Laterality: Bilateral;   FINGER ARTHROSCOPY WITH CARPOMETACARPEL (CMC) ARTHROPLASTY Right 09/28/2015   Procedure: RIGHT THUMB TRAPEZIUM EXCISION WITH CARPOMETACARPEL (CMC) ARTHROPLASTY AND TENDON TRANSFER;  Surgeon: Bradly Bienenstock, MD;  Location: [facility];  Service: Orthopedics;  Laterality: Right;   FRONTAL SINUS EXPLORATION Bilateral 08/15/2022   Procedure: FRONTAL RECESS SINUS EXPLORATION WITH NAVIGATION;  Surgeon: Newman Pies, MD;  Location: [facility];  Service: ENT;  Laterality: Bilateral;   HEMORRHOID SURGERY     almost 40 yrs ago   HYSTEROSCOPY WITH D & C  08/23/2000   and resectoscopic myomectomy   LUMBAR DISC SURGERY  03/16/2005   LUMBAR EPIDURAL INJECTION     MAXILLARY ANTROSTOMY Bilateral 08/15/2022   Procedure: ENDOSCOPIC MAXILLARY ANTROSTOMY WITH TISSUE REMOVAL;  Surgeon: Newman Pies, MD;  Location: [facility];  Service: ENT;  Laterality: Bilateral;   PLANTAR FASCIA RELEASE Left 02/03/2010   and torn tendon   REVERSE SHOULDER ARTHROPLASTY Right 12/14/2020   Procedure: REVERSE SHOULDER ARTHROPLASTY;  Surgeon: Beverely Low, MD;  Location: [facility];  Service: Orthopedics;  Laterality: Right;   RIGHT/LEFT HEART CATH AND CORONARY ANGIOGRAPHY N/A 09/07/2020   Procedure: RIGHT/LEFT HEART CATH AND CORONARY ANGIOGRAPHY;  Surgeon: Yvonne Kendall, MD;  Location: [facility];  Service: Cardiovascular;  Laterality: N/A;  SINUS ENDO W/FUSION Bilateral 08/15/2022   Procedure: SPHENOIDECTOMY WITH TISSUE REMOVAL WITH NAVIGATION;  Surgeon: Newman Pies, MD;  Location: [facility];  Service: ENT;  Laterality: Bilateral;   TENDON TRANSFER Right 09/28/2015   Procedure: TENDON TRANSFER;  Surgeon: Bradly Bienenstock, MD;  Location: [facility];  Service: Orthopedics;  Laterality: Right;   TONSILLECTOMY     TOTAL KNEE ARTHROPLASTY Left 04/24/2023   Procedure: TOTAL KNEE ARTHROPLASTY;  Surgeon: Ollen Gross, MD;  Location: [facility];  Service: Orthopedics;  Laterality:  Left;   TUBAL LIGATION     TURBINATE REDUCTION Bilateral 08/15/2022   Procedure: TURBINATE REDUCTION;  Surgeon: Newman Pies, MD;  Location: [facility];  Service: ENT;  Laterality: Bilateral;   WRIST SURGERY     left, removal of cyst   Patient Active Problem List   Diagnosis Date Noted   Screening for osteoporosis 10/04/2022   Stage 3a chronic kidney disease (HCC) 10/04/2022   Vocal fold paresis, left 04/21/2022   Laryngospasms 04/21/2022   Muscle tension dysphonia 04/21/2022   Old tear of meniscus of left knee 09/28/2021   Combined hyperlipidemia associated with type 2 diabetes mellitus (HCC) 09/28/2021   Gout 07/19/2021   S/P shoulder replacement, right 12/14/2020   Rotator cuff tear arthropathy 10/22/2020   Nonischemic cardiomyopathy (HCC) 10/06/2020   Congestive heart failure with left ventricular systolic dysfunction (HCC) 08/24/2020   Primary osteoarthritis of left shoulder 07/29/2020   DDD (degenerative disc disease), cervical 04/30/2020   Osteoarthritis of left knee 11/05/2018   Chronic pansinusitis 10/04/2017   Chronic low back pain 09/05/2017   Degenerative lumbar disc 06/04/2015   S/P lumbar laminectomy 06/04/2015   Restless leg syndrome 05/13/2014   Type 2 diabetes mellitus with peripheral neuropathy (HCC) 04/09/2013   Spinal stenosis of lumbar region without neurogenic claudication 09/12/2012   GERD (gastroesophageal reflux disease) 05/25/2011   Hemorrhoids, internal, with bleeding 05/25/2011   Hypertension associated with diabetes (HCC) 11/08/2010   Chronic allergic rhinitis 08/19/2010   Mitral valve prolapse 10/10/2008   REFERRING PROVIDER: Eartha Inch, PA   REFERRING DIAG: Unilateral primary osteoarthritis, left knee   THERAPY DIAG:  Acute pain of left knee  Stiffness of left knee, not elsewhere classified  Localized edema  Rationale for Evaluation and Treatment: Rehabilitation  ONSET DATE: 04/24/23  SUBJECTIVE:   SUBJECTIVE  STATEMENT: Patient reports 6/10 left knee pain today.   PERTINENT HISTORY: Allergies, hypertension, diabetes, congestive heart failure, osteoarthritis, chronic low back pain, chronic kidney disease, anxiety, claustrophobia, and fibromyalgia PAIN:  Are you having pain? Yes: NPRS scale: 6/10 Pain location: left knee Pain description: constant aching, throbbing, and burning  Aggravating factors: standing, walking, and moving her knee  Relieving factors: ice and medication   PRECAUTIONS: None  RED FLAGS: None   WEIGHT BEARING RESTRICTIONS: No  FALLS:  Has patient fallen in last 6 months? No  LIVING ENVIRONMENT: Lives with: lives with their family Lives in: House/apartment Stairs: Yes: Internal: 14 steps; on right going up and External: 1 steps; none Has following equipment at home: Walker - 2 wheeled  OCCUPATION: retired  PLOF: Independent  PATIENT GOALS: be able to paint the last two rooms of her house, be able to do her yard work, reduced pain, and improved mobility  NEXT MD VISIT: 05/10/23  OBJECTIVE: all objective measures were assessed at her initial evaluation on 04/27/23 unless otherwise noted  PATIENT SURVEYS:  FOTO 12.20  COGNITION: Overall cognitive status: Within functional limits for tasks assessed  SENSATION: Patient reports no numbness or tingling  EDEMA:  Circumferential: L tibiofemoral joint line: 53.3 cn R tibiofemoral joint line: 49.0 cm  POSTURE: forward head and flexed trunk   PALPATION: TTP: left quadriceps, hip adductors, hamstrings, medial and lateral tibiofemoral joint lines  LOWER EXTREMITY ROM:  Active ROM Right eval Left eval  Hip flexion    Hip extension    Hip abduction    Hip adduction    Hip internal rotation    Hip external rotation    Knee flexion 133 39/ 43 (PROM)  Knee extension 0 20  Ankle dorsiflexion    Ankle plantarflexion    Ankle inversion    Ankle eversion     (Blank rows = not tested)  LOWER EXTREMITY  MMT: not assessed due to surgical condition  LOWER EXTREMITY SPECIAL TESTS:  Not tested due to surgical condition  GAIT: Assistive device utilized: Environmental consultant - 2 wheeled and Wheelchair (manual) Level of assistance: Modified independence and Total A Comments: Patient utilized a shuffling pattern with the FWW, but utilized for long distance mobility    TODAY'S TREATMENT:                                                                                                                              DATE:                                    05/04/23 EXERCISE LOG  Exercise Repetitions and Resistance Comments  Nustep  L2 x 16 minutes; seat 9   Supine ankle pump  3 mins LLE only   Supine heel slide Heel Glide x 3 mins LLE only   Supine quad set  2 minutes w/ 5 second hold  LLE only   Supine ham set 2 mins w/ 5 sec hold   Supine SAQ    Seated Hip Abduction 3 mins w/ 3 sec hold   Seated Hip Adduction Red tband 3 mins w/ 3 sec hold    Blank cell = exercise not performed today  Modalities: no redness or adverse reaction to today's modalities   Date:  Vaso: Knee, 34 degrees; low pressure, 15 mins, Pain and Edema   PATIENT EDUCATION:  Education details: HEP, plan of care, prognosis, healing, and goals for therapy Person educated: Patient Education method: Explanation Education comprehension: verbalized understanding  HOME EXERCISE PROGRAM: [id number]  ASSESSMENT:  CLINICAL IMPRESSION: Pt arrives for today's treatment session reporting 6/10 left knee pain.  Pt instructed in various new supine and seated LLE exercises to increase strength and function.  Pt requiring min cues for proper technique and hold time.  Last several reps of heel slides performed with over pressure to assist with increased ROM.  Normal responses to vaso noted upon removal.  Pt reported 4/10 left knee pain at completion of today's treatment session.   OBJECTIVE IMPAIRMENTS: Abnormal gait, decreased  activity tolerance,  decreased mobility, difficulty walking, decreased ROM, decreased strength, hypomobility, increased edema, impaired tone, and pain.   ACTIVITY LIMITATIONS: carrying, lifting, sitting, standing, squatting, stairs, transfers, bed mobility, bathing, dressing, locomotion level, and caring for others  PARTICIPATION LIMITATIONS: meal prep, cleaning, laundry, driving, shopping, community activity, and yard work  PERSONAL FACTORS: Past/current experiences, Transportation, and 3+ comorbidities: Allergies, hypertension, diabetes, congestive heart failure, osteoarthritis, chronic low back pain, chronic kidney disease, anxiety, claustrophobia, and fibromyalgia  are also affecting patient's functional outcome.   REHAB POTENTIAL: Good  CLINICAL DECISION MAKING: Evolving/moderate complexity  EVALUATION COMPLEXITY: Moderate   GOALS: Goals reviewed with patient? Yes  SHORT TERM GOALS: Target date: 05/18/23 Patient will be independent with her initial HEP. Baseline: Goal status: INITIAL  2.  Patient will be able to demonstrate active left knee extension within 10 degrees of neutral for improved knee mobility.  Baseline:  Goal status: INITIAL  3.  Patient will be able to demonstrate active left knee flexion to at least 90 degrees for improved function with transfers. Baseline:  Goal status: INITIAL  4.  Patient will be able to safely ambulate community distances with a walker with the least restrictive assistive device for improved community mobility. Baseline:  Goal status: INITIAL  LONG TERM GOALS: Target date: 06/08/23  Patient will be independent with her advanced HEP. Baseline:  Goal status: INITIAL  2.  Patient will be able to demonstrate active left knee extension within 5 degrees of neutral for improved gait mechanics. Baseline:  Goal status: INITIAL  3.  Patient will improve her active left knee flexion to at least 120 degrees for improved function navigating stairs. Baseline:   Goal status: INITIAL  4.  Patient will be able to safely ambulate with minimal to no gait deviations for improved mobility. Baseline:  Goal status: INITIAL  5.  Patient will be able to transfer from sitting to standing with minimal to no difficulty for improved household mobility. Baseline:  Goal status: INITIAL  PLAN:  PT FREQUENCY: 2-3x/week  PT DURATION: 6 weeks  PLANNED INTERVENTIONS: Therapeutic exercises, Therapeutic activity, Neuromuscular re-education, Balance training, Gait training, Patient/Family education, Self Care, Joint mobilization, Stair training, Electrical stimulation, Cryotherapy, Moist heat, Vasopneumatic device, Manual therapy, and Re-evaluation  PLAN FOR NEXT SESSION: Nustep, review and update HEP as needed, manual therapy, gait training, and modalities as needed   Newman Pies, PTA 05/04/2023, 9:23 AM

## 2023-05-05 ENCOUNTER — Other Ambulatory Visit: Payer: Self-pay

## 2023-05-05 MED ORDER — ROSUVASTATIN CALCIUM 20 MG PO TABS: 20.0000 mg | ORAL_TABLET | Freq: Every day | ORAL | 0 refills | Status: DC

## 2023-05-09 ENCOUNTER — Ambulatory Visit (INDEPENDENT_AMBULATORY_CARE_PROVIDER_SITE_OTHER): Payer: Medicare Other

## 2023-05-09 ENCOUNTER — Ambulatory Visit: Payer: Medicare Other | Admitting: Physical Therapy

## 2023-05-09 VITALS — Wt 193.0 lb

## 2023-05-09 DIAGNOSIS — R600 Localized edema: Secondary | ICD-10-CM

## 2023-05-09 DIAGNOSIS — M25662 Stiffness of left knee, not elsewhere classified: Secondary | ICD-10-CM

## 2023-05-09 DIAGNOSIS — M25562 Pain in left knee: Secondary | ICD-10-CM | POA: Diagnosis not present

## 2023-05-09 DIAGNOSIS — Z0000 Encounter for general adult medical examination without abnormal findings: Secondary | ICD-10-CM | POA: Diagnosis not present

## 2023-05-09 NOTE — Progress Notes (Signed)
Subjective:   [name] is a 77 y.o. female who presents for Medicare Annual (Subsequent) preventive examination.  Visit Complete: Virtual I connected with  [name] on 05/09/23 by a audio enabled telemedicine application and verified that I am speaking with the correct person using two identifiers.  Patient Location: Home  Provider Location: Office/Clinic  I discussed the limitations of evaluation and management by telemedicine. The patient expressed understanding and agreed to proceed.  Vital Signs: Because this visit was a virtual/telehealth visit, some criteria may be missing or patient reported. Any vitals not documented were not able to be obtained and vitals that have been documented are patient reported.   Because this visit was a virtual/telehealth visit, some criteria may be missing or patient reported. Any vitals not documented were not able to be obtained and vitals that have been documented are patient reported.    Cardiac Risk Factors include: advanced age (>5men, >14 women);diabetes mellitus;dyslipidemia;hypertension;obesity (BMI >30kg/m2)     Objective:    Today's Vitals   05/09/23 1329  Weight: 193 lb (87.5 kg)   Body mass index is 33.13 kg/m.     05/09/2023    1:35 PM 04/27/2023    4:24 PM 04/24/2023   11:13 AM 04/13/2023   10:25 AM 08/15/2022    7:13 AM 08/08/2022    3:32 PM 05/06/2022    2:42 PM  Advanced Directives  Does Patient Have a Medical Advance Directive? No No No No No No No  Copy of Healthcare Power of Attorney in Chart?     No - copy requested No - copy requested   Would patient like information on creating a medical advance directive? No - Patient declined  No - Patient declined No - Patient declined No - Patient declined No - Patient declined No - Patient declined    Current Medications (verified) Outpatient Encounter Medications as of 05/09/2023  Medication Sig   allopurinol (ZYLOPRIM) 100 MG tablet TAKE 1 TABLET BY MOUTH EVERY  DAY   aspirin EC (ASPIRIN 81) 81 MG tablet Take 1 tablet by mouth twice a day for three weeks following surgery. Then take an 81 mg aspirin once a day for three weeks. Then discontinue.   Calcium-Magnesium-Zinc (CAL-MAG-ZINC PO) Take 1 tablet by mouth in the morning.   Carboxymeth-Glyc-Polysorb PF (REFRESH OPTIVE MEGA-3) 0.5-1-0.5 % SOLN Place 1-2 drops into both eyes 3 (three) times daily as needed (dry/irritated eyes.).   carvedilol (COREG) 3.125 MG tablet Take 1 tablet (3.125 mg total) by mouth 2 (two) times daily.   Cyanocobalamin (VITAMIN B-12 PO) Take 1 tablet by mouth in the morning.   dapagliflozin propanediol (FARXIGA) 10 MG TABS tablet Take 1 tablet (10 mg total) by mouth daily before breakfast.   fluticasone (FLONASE) 50 MCG/ACT nasal spray SPRAY 1 SPRAY INTO BOTH NOSTRILS DAILY.   furosemide (LASIX) 20 MG tablet Take 1 tablet (20 mg total) by mouth as directed. Monday,wed, Friday and sat only.   insulin glargine (LANTUS SOLOSTAR) 100 UNIT/ML Solostar Pen INJECT 15 UNITS INTO THE SKIN AT BEDTIME. (Patient taking differently: 20 Units.)   Insulin Pen Needle (ASSURE ID SAFETY PEN NEEDLES) 30G X 5 MM MISC USE AS DIRECTED   metFORMIN (GLUCOPHAGE) 500 MG tablet TAKE 1 TABLET BY MOUTH 2 TIMES DAILY WITH A MEAL.   montelukast (SINGULAIR) 10 MG tablet TAKE 1 TABLET BY MOUTH EVERY DAY   Multiple Vitamin (MULTIVITAMIN WITH MINERALS) TABS tablet Take 1 tablet by mouth in the morning.  ondansetron (ZOFRAN) 4 MG tablet Take 1 tablet (4 mg total) by mouth every 6 (six) hours as needed for nausea.   pregabalin (LYRICA) 100 MG capsule Take 1 capsule by mouth twice daily   rOPINIRole (REQUIP) 3 MG tablet TAKE 1 TABLET BY MOUTH AT BEDTIME.   rosuvastatin (CRESTOR) 20 MG tablet Take 1 tablet (20 mg total) by mouth daily.   sacubitril-valsartan (ENTRESTO) 49-51 MG Take 1 tablet by mouth 2 (two) times daily.   spironolactone (ALDACTONE) 25 MG tablet Take 0.5 tablets (12.5 mg total) by mouth daily.    traMADol (ULTRAM) 50 MG tablet Take 1-2 tablets (50-100 mg total) by mouth every 6 (six) hours as needed for moderate pain.   cholecalciferol (VITAMIN D) 1000 UNITS tablet Take 1,000 Units by mouth in the morning.   famotidine (PEPCID) 20 MG tablet Take 20 mg by mouth at bedtime.   methocarbamol (ROBAXIN) 500 MG tablet Take 1 tablet (500 mg total) by mouth every 6 (six) hours as needed for muscle spasms. (Patient not taking: Reported on 05/09/2023)   omeprazole (PRILOSEC) 40 MG capsule TAKE 1 CAPSULE (40 MG TOTAL) BY MOUTH DAILY.   oxyCODONE (OXY IR/ROXICODONE) 5 MG immediate release tablet 1-2 tablets q 6 hrs prn severe pain (Patient not taking: Reported on 05/09/2023)   [DISCONTINUED] aspirin 81 MG chewable tablet Chew 1 tablet (81 mg total) by mouth 2 (two) times daily for 20 days. Then take one 81 mg aspirin once a day for three weeks. Then discontinue aspirin.   [DISCONTINUED] HYDROmorphone (DILAUDID) 2 MG tablet Take 1-2 tablets (2-4 mg total) by mouth every 6 (six) hours as needed for severe pain. (Patient not taking: Reported on 04/27/2023)   [DISCONTINUED] HYDROmorphone (DILAUDID) 2 MG tablet Take 1-2 tablets (2-4 mg total) by mouth every 6 (six) hours as needed for severe pain (Patient not taking: Reported on 04/27/2023)   No facility-administered encounter medications on file as of 05/09/2023.    Allergies (verified) Amlodipine, Amoxicillin-pot clavulanate, Depo-medrol [methylprednisolone], Gabapentin, Levothyroxine, Sitagliptin, Amoxicillin, Codeine, Corticosteroids, Tetracyclines & related, Ace inhibitors, and Dexamethasone   History: 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) 08/24/2020   Diabetes mellitus without complication (HCC)    takes Trulicity,Jardiance,and Metformin  daily.Average fasting blood sugar runs around130   Dysrhythmia    PVCs, has MVP   Fibromyalgia    GERD (gastroesophageal reflux disease)    takes Omeprazole daily   Gout 07/19/2021   On allopurinol   History of bronchitis > 8 yrs ago   History of shingles    HTN (hypertension)    takes Amlodipine and Micardis daily   Hx of colonic polyps    benign   Internal and external hemorrhoids without complication    Joint pain    Mitral valve prolapse    Neuromuscular disorder (HCC)    restless leg syndrome   Nocturia    Pneumonia    PONV (postoperative nausea and vomiting)    when gets injections in joints gets hives.Betadine rash   Restless leg syndrome    takes Requip at bedtime   Seasonal allergies    takes Claritin daily as needed   Urinary frequency    Uterine fibroid    Past Surgical History:  Procedure Laterality Date   BUNIONECTOMY  Bilateral    COLONOSCOPY  07/12/2004   diverticulosis, internal and external hemorrhoids   ETHMOIDECTOMY Bilateral 08/15/2022   Procedure: ETHMOIDECTOMY WITH TISSUE REMOVAL;  Surgeon: Newman Pies, MD;  Location: [facility];  Service: ENT;  Laterality: Bilateral;   FINGER ARTHROSCOPY WITH CARPOMETACARPEL ([facility]) ARTHROPLASTY Right 09/28/2015   Procedure: RIGHT THUMB TRAPEZIUM EXCISION WITH CARPOMETACARPEL ([facility]) ARTHROPLASTY AND TENDON TRANSFER;  Surgeon: Bradly Bienenstock, MD;  Location: [facility];  Service: Orthopedics;  Laterality: Right;   FRONTAL SINUS EXPLORATION Bilateral 08/15/2022   Procedure: FRONTAL RECESS SINUS EXPLORATION WITH NAVIGATION;  Surgeon: Newman Pies, MD;  Location: [facility];  Service: ENT;  Laterality: Bilateral;   HEMORRHOID SURGERY     almost 40 yrs ago   HYSTEROSCOPY WITH D & C  08/23/2000   and resectoscopic myomectomy   LUMBAR DISC SURGERY  03/16/2005   LUMBAR EPIDURAL INJECTION     MAXILLARY ANTROSTOMY Bilateral 08/15/2022   Procedure: ENDOSCOPIC MAXILLARY ANTROSTOMY WITH TISSUE REMOVAL;  Surgeon:  Newman Pies, MD;  Location: [facility];  Service: ENT;  Laterality: Bilateral;   PLANTAR FASCIA RELEASE Left 02/03/2010   and torn tendon   REVERSE SHOULDER ARTHROPLASTY Right 12/14/2020   Procedure: REVERSE SHOULDER ARTHROPLASTY;  Surgeon: Beverely Low, MD;  Location: [facility];  Service: Orthopedics;  Laterality: Right;   RIGHT/LEFT HEART CATH AND CORONARY ANGIOGRAPHY N/A 09/07/2020   Procedure: RIGHT/LEFT HEART CATH AND CORONARY ANGIOGRAPHY;  Surgeon: Yvonne Kendall, MD;  Location: [facility];  Service: Cardiovascular;  Laterality: N/A;   SINUS ENDO W/FUSION Bilateral 08/15/2022   Procedure: SPHENOIDECTOMY WITH TISSUE REMOVAL WITH NAVIGATION;  Surgeon: Newman Pies, MD;  Location: [facility];  Service: ENT;  Laterality: Bilateral;   TENDON TRANSFER Right 09/28/2015   Procedure: TENDON TRANSFER;  Surgeon: Bradly Bienenstock, MD;  Location: [facility];  Service: Orthopedics;  Laterality: Right;   TONSILLECTOMY     TOTAL KNEE ARTHROPLASTY Left 04/24/2023   Procedure: TOTAL KNEE ARTHROPLASTY;  Surgeon: Ollen Gross, MD;  Location: [facility];  Service: Orthopedics;  Laterality: Left;   TUBAL LIGATION     TURBINATE REDUCTION Bilateral 08/15/2022   Procedure: TURBINATE REDUCTION;  Surgeon: Newman Pies, MD;  Location:  SURGERY CENTER;  Service: ENT;  Laterality: Bilateral;   WRIST SURGERY     left, removal of cyst   Family History  Problem Relation Age of Onset   Pancreatic cancer Father    Colon cancer Neg Hx    Breast cancer Neg Hx    Social History   Socioeconomic History   Marital status: Married    Spouse name: Not on file   Number of children: 3   Years of education: Not on file   Highest education level: Associate degree: occupational, Scientist, product/process development, or vocational program  Occupational History   Occupation: Retired   Tobacco Use   Smoking status: Never   Smokeless tobacco: Never  Vaping Use   Vaping status: Never Used  Substance and Sexual Activity    Alcohol use: No    Alcohol/week: 0.0 standard drinks of alcohol   Drug use: No   Sexual activity: Yes  Other Topics Concern   Not on file  Social History Narrative   1 caffeine drinks daily         Social Determinants of Health   Financial Resource Strain: Low Risk  (05/09/2023)   Overall Financial Resource Strain (CARDIA)    Difficulty of Paying Living Expenses: Not hard at all  Food  Insecurity: No Food Insecurity (05/09/2023)   Hunger Vital Sign    Worried About Running Out of Food in the Last Year: Never true    Ran Out of Food in the Last Year: Never true  Transportation Needs: No Transportation Needs (05/09/2023)   PRAPARE - Administrator, Civil Service (Medical): No    Lack of Transportation (Non-Medical): No  Physical Activity: Insufficiently Active (05/09/2023)   Exercise Vital Sign    Days of Exercise per Week: 2 days    Minutes of Exercise per Session: 40 min  Stress: No Stress Concern Present (05/09/2023)   [employer] of Occupational Health - Occupational Stress Questionnaire    Feeling of Stress : Not at all  Social Connections: Moderately Integrated (05/09/2023)   Social Connection and Isolation Panel [NHANES]    Frequency of Communication with Friends and Family: More than three times a week    Frequency of Social Gatherings with Friends and Family: Twice a week    Attends Religious Services: More than 4 times per year    Active Member of [employer] or Organizations: No    Attends Banker Meetings: Never    Marital Status: Married    Tobacco Counseling Counseling given: Not Answered   Clinical Intake:  Pre-visit preparation completed: Yes  Pain : 0-10 Pain Type: Other (Comment) (had PT) Pain Location: Knee Pain Orientation: Left Pain Descriptors / Indicators: Aching Pain Onset: 1 to 4 weeks ago Pain Frequency: Constant     BMI - recorded: 33.13 Nutritional Status: BMI > 30  Obese Nutritional Risks: None Diabetes:  Yes CBG done?: Yes (74 per pt before breakfast) CBG resulted in Enter/ Edit results?: No Did pt. bring in CBG monitor from home?: No  How often do you need to have someone help you when you read instructions, pamphlets, or other written materials from your doctor or pharmacy?: 1 - Never  Interpreter Needed?: No  Information entered by :: Lanier Ensign, LPN   Activities of Daily Living    05/09/2023    1:38 PM 04/24/2023   11:13 AM  In your present state of health, do you have any difficulty performing the following activities:  Hearing? 0 0  Vision? 0 0  Difficulty concentrating or making decisions? 0 1  Walking or climbing stairs? 1 1  Dressing or bathing? 0 0  Doing errands, shopping? 0 0  Preparing Food and eating ? N   Using the Toilet? N   In the past six months, have you accidently leaked urine? N   Do you have problems with loss of bowel control? N   Managing your Medications? N   Managing your Finances? N   Housekeeping or managing your Housekeeping? N     Patient Care Team: Willow Ora, MD as PCP - General (Family Medicine) Meriam Sprague, MD as PCP - Cardiology (Cardiology) Drema Halon, MD (Inactive) as Consulting Physician (Otolaryngology) Beverely Low, MD as Consulting Physician (Orthopedic Surgery)  Indicate any recent Medical Services you may have received from other than [facility] providers in the past year (date may be approximate).     Assessment:   This is a routine wellness examination for [facility].  Hearing/Vision screen Hearing Screening - Comments:: Pt denies any hearing issues  Vision Screening - Comments:: Pt follows up with Dr Cherlynn Polo for annual eye exams    Goals Addressed             This Visit's Progress  Patient Stated       Continue working with PT for knee       Depression Screen    05/09/2023    1:36 PM 10/04/2022    9:28 AM 05/06/2022    2:42 PM 04/05/2022    9:29 AM 04/23/2021    2:39 PM 10/06/2020    8:56 AM   PHQ 2/9 Scores  PHQ - 2 Score 0 0 0 0 0 0    Fall Risk    05/09/2023    1:38 PM 10/04/2022    9:28 AM 05/06/2022    2:43 PM 04/05/2022    9:29 AM 12/29/2021   10:27 AM  Fall Risk   Falls in the past year? 0 0 0 0 0  Number falls in past yr: 0 0 0 0 0  Injury with Fall? 0 0 0 0 0  Risk for fall due to : Impaired vision;Impaired mobility No Fall Risks No Fall Risks;Impaired vision No Fall Risks No Fall Risks  Follow up Falls prevention discussed Falls evaluation completed Falls prevention discussed Falls evaluation completed Falls evaluation completed    MEDICARE RISK AT HOME: Medicare Risk at Home Any stairs in or around the home?: Yes If so, are there any without handrails?: No Home free of loose throw rugs in walkways, pet beds, electrical cords, etc?: Yes Adequate lighting in your home to reduce risk of falls?: Yes Life alert?: No Use of a cane, walker or w/c?: Yes Grab bars in the bathroom?: Yes Shower chair or bench in shower?: Yes Elevated toilet seat or a handicapped toilet?: Yes  TIMED UP AND GO:  Was the test performed?  No    Cognitive Function:        05/09/2023    1:39 PM 05/06/2022    2:45 PM 04/23/2021    2:52 PM  6CIT Screen  What Year? 0 points 0 points 0 points  What month? 0 points 0 points 0 points  What time? 0 points 0 points 0 points  Count back from 20 0 points 0 points 0 points  Months in reverse 0 points 0 points 0 points  Repeat phrase 0 points 0 points 0 points  Total Score 0 points 0 points 0 points    Immunizations Immunization History  Administered Date(s) Administered   Fluad Quad(high Dose 65+) 04/19/2018, 06/16/2020, 07/01/2020, 04/16/2021, 05/10/2022   Influenza Split 06/01/2010   Influenza, High Dose Seasonal PF 05/13/2014, 04/24/2015, 04/12/2017, 04/19/2018, 04/24/2019, 06/16/2020   Influenza, Seasonal, Injecte, Preservative Fre 04/09/2013, 05/03/2016   Influenza,inj,Quad PF,6+ Mos 05/01/2017   Influenza-Unspecified 04/03/2012    Moderna Sars-Covid-2 Vaccination 01/15/2021, 02/12/2021   Pneumococcal Conjugate-13 05/13/2014   Pneumococcal Polysaccharide-23 02/15/2011   Td 08/01/1998   Td (Adult), 2 Lf Tetanus Toxid, Preservative Free 08/01/1998   Tdap 05/01/2004   Unspecified SARS-COV-2 Vaccination 05/11/2021   Zoster Recombinant(Shingrix) 04/24/2019, 08/13/2019   Zoster, Live 02/15/2011    TDAP status: Due, Education has been provided regarding the importance of this vaccine. Advised may receive this vaccine at local pharmacy or Health Dept. Aware to provide a copy of the vaccination record if obtained from local pharmacy or Health Dept. Verbalized acceptance and understanding.  Flu Vaccine status: Due, Education has been provided regarding the importance of this vaccine. Advised may receive this vaccine at local pharmacy or Health Dept. Aware to provide a copy of the vaccination record if obtained from local pharmacy or Health Dept. Verbalized acceptance and understanding.  Pneumococcal vaccine status: Up to date  Covid-19 vaccine status: Information provided on how to obtain vaccines.   Qualifies for Shingles Vaccine? Yes   Zostavax completed Yes   Shingrix Completed?: Yes  Screening Tests Health Maintenance  Topic Date Due   DTaP/Tdap/Td (4 - Td or Tdap) 05/01/2014   COVID-19 Vaccine (4 - 2023-24 season) 04/02/2023   INFLUENZA VACCINE  10/30/2023 (Originally 03/02/2023)   OPHTHALMOLOGY EXAM  08/05/2023   Diabetic kidney evaluation - Urine ACR  10/04/2023   FOOT EXAM  10/04/2023   HEMOGLOBIN A1C  10/11/2023   MAMMOGRAM  11/16/2023   Diabetic kidney evaluation - eGFR measurement  04/24/2024   Medicare Annual Wellness (AWV)  05/08/2024   DEXA SCAN  02/11/2026   Pneumonia Vaccine 76+ Years old  Completed   Zoster Vaccines- Shingrix  Completed   HPV VACCINES  Aged Out   Colonoscopy  Discontinued   Hepatitis C Screening  Discontinued    Health Maintenance  Health Maintenance Due  Topic Date Due    DTaP/Tdap/Td (4 - Td or Tdap) 05/01/2014   COVID-19 Vaccine (4 - 2023-24 season) 04/02/2023    Colorectal cancer screening: No longer required.   Mammogram status: Completed 11/16/22. Repeat every year  Bone Density status: Completed 02/11/21. Results reflect: Bone density results: NORMAL. Repeat every 5 years.    Additional Screening:  Hepatitis C Screening: does not qualify;  Vision Screening: Recommended annual ophthalmology exams for early detection of glaucoma and other disorders of the eye. Is the patient up to date with their annual eye exam?  Yes  Who is the provider or what is the name of the office in which the patient attends annual eye exams? [facility] Dr Cherlynn Polo  If pt is not established with a provider, would they like to be referred to a provider to establish care? No .   Dental Screening: Recommended annual dental exams for proper oral hygiene    Community Resource Referral / Chronic Care Management: CRR required this visit?  No   CCM required this visit?  No     Plan:     I have personally reviewed and noted the following in the patient's chart:   Medical and social history Use of alcohol, tobacco or illicit drugs  Current medications and supplements including opioid prescriptions. Patient is currently taking opioid prescriptions. Information provided to patient regarding non-opioid alternatives. Patient advised to discuss non-opioid treatment plan with their provider. Functional ability and status Nutritional status Physical activity Advanced directives List of other physicians Hospitalizations, surgeries, and ER visits in previous 12 months Vitals Screenings to include cognitive, depression, and falls Referrals and appointments  In addition, I have reviewed and discussed with patient certain preventive protocols, quality metrics, and best practice recommendations. A written personalized care plan for preventive services as well as general  preventive health recommendations were provided to patient.     Marzella Schlein, LPN   27/09/5364   After Visit Summary: (MyChart) Due to this being a telephonic visit, the after visit summary with patients personalized plan was offered to patient via MyChart   Nurse Notes: none

## 2023-05-09 NOTE — Patient Instructions (Signed)
Ms. Resor , Thank you for taking time to come for your Medicare Wellness Visit. I appreciate your ongoing commitment to your health goals. Please review the following plan we discussed and let me know if I can assist you in the future.   Referrals/Orders/Follow-Ups/Clinician Recommendations: continue working with PT   This is a list of the screening recommended for you and due dates:  Health Maintenance  Topic Date Due   DTaP/Tdap/Td vaccine (4 - Td or Tdap) 05/01/2014   Flu Shot  03/02/2023   COVID-19 Vaccine (4 - 2023-24 season) 04/02/2023   Eye exam for diabetics  08/05/2023   Yearly kidney health urinalysis for diabetes  10/04/2023   Complete foot exam   10/04/2023   Hemoglobin A1C  10/11/2023   Mammogram  11/16/2023   Yearly kidney function blood test for diabetes  04/24/2024   Medicare Annual Wellness Visit  05/08/2024   DEXA scan (bone density measurement)  02/11/2026   Pneumonia Vaccine  Completed   Zoster (Shingles) Vaccine  Completed   HPV Vaccine  Aged Out   Colon Cancer Screening  Discontinued   Hepatitis C Screening  Discontinued    Advanced directives: (Declined) Advance directive discussed with you today. Even though you declined this today, please call our office should you change your mind, and we can give you the proper paperwork for you to fill out.  Next Medicare Annual Wellness Visit scheduled for next year: Yes

## 2023-05-09 NOTE — Therapy (Signed)
OUTPATIENT PHYSICAL THERAPY LOWER EXTREMITY TREATMENT   Patient Name: [name] MRN: [id number] DOB:06/21/1946, 77 y.o., female Today's Date: 05/09/2023  END OF SESSION:  PT End of Session - 05/09/23 0955     Visit Number 4    Number of Visits 12    Date for PT Re-Evaluation 06/30/23    PT Start Time 0932    PT Stop Time 1025    PT Time Calculation (min) 53 min    Activity Tolerance Patient limited by pain    Behavior During Therapy [facility] for tasks assessed/performed              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) 08/24/2020   Diabetes mellitus without complication (HCC)    takes Trulicity,Jardiance,and Metformin daily.Average fasting blood sugar runs around130   Dysrhythmia    PVCs, has MVP   Fibromyalgia    GERD (gastroesophageal reflux disease)    takes Omeprazole daily   Gout 07/19/2021   On allopurinol   History of bronchitis > 8 yrs ago   History of shingles    HTN (hypertension)    takes Amlodipine and Micardis daily   Hx of colonic polyps    benign   Internal and external hemorrhoids without complication    Joint pain    Mitral valve prolapse    Neuromuscular disorder (HCC)    restless leg syndrome   Nocturia    Pneumonia    PONV (postoperative nausea and vomiting)    when gets injections in joints gets hives.Betadine rash   Restless leg syndrome    takes Requip at bedtime   Seasonal allergies    takes Claritin daily as needed   Urinary frequency    Uterine fibroid    Past Surgical History:  Procedure Laterality Date   BUNIONECTOMY Bilateral    COLONOSCOPY  07/12/2004   diverticulosis, internal and external hemorrhoids   ETHMOIDECTOMY Bilateral 08/15/2022   Procedure: ETHMOIDECTOMY WITH TISSUE REMOVAL;  Surgeon: Newman Pies, MD;   Location: [facility];  Service: ENT;  Laterality: Bilateral;   FINGER ARTHROSCOPY WITH CARPOMETACARPEL (CMC) ARTHROPLASTY Right 09/28/2015   Procedure: RIGHT THUMB TRAPEZIUM EXCISION WITH CARPOMETACARPEL (CMC) ARTHROPLASTY AND TENDON TRANSFER;  Surgeon: Bradly Bienenstock, MD;  Location: [facility];  Service: Orthopedics;  Laterality: Right;   FRONTAL SINUS EXPLORATION Bilateral 08/15/2022   Procedure: FRONTAL RECESS SINUS EXPLORATION WITH NAVIGATION;  Surgeon: Newman Pies, MD;  Location: [facility];  Service: ENT;  Laterality: Bilateral;   HEMORRHOID SURGERY     almost 40 yrs ago   HYSTEROSCOPY WITH D & C  08/23/2000   and resectoscopic myomectomy   LUMBAR DISC SURGERY  03/16/2005   LUMBAR EPIDURAL INJECTION     MAXILLARY ANTROSTOMY Bilateral 08/15/2022   Procedure: ENDOSCOPIC MAXILLARY ANTROSTOMY WITH TISSUE REMOVAL;  Surgeon: Newman Pies, MD;  Location:  S[facility]ERY [facility];  Service: ENT;  Laterality: Bilateral;   PLANTAR FASCIA RELEASE Left 02/03/2010   and torn tendon   REVERSE SHOULDER ARTHROPLASTY Right 12/14/2020   Procedure: REVERSE SHOULDER ARTHROPLASTY;  Surgeon: Beverely Low, MD;  Location: [facility];  Service: Orthopedics;  Laterality: Right;   RIGHT/LEFT HEART CATH AND CORONARY ANGIOGRAPHY N/A 09/07/2020   Procedure: RIGHT/LEFT HEART CATH AND CORONARY ANGIOGRAPHY;  Surgeon: Yvonne Kendall, MD;  Location: [facility];  Service: Cardiovascular;  Laterality: N/A;   SINUS ENDO W/FUSION Bilateral 08/15/2022   Procedure: SPHENOIDECTOMY WITH TISSUE REMOVAL WITH NAVIGATION;  Surgeon: Newman Pies, MD;  Location: [facility];  Service: ENT;  Laterality: Bilateral;   TENDON TRANSFER Right 09/28/2015   Procedure: TENDON TRANSFER;  Surgeon: Bradly Bienenstock, MD;  Location: [facility];  Service: Orthopedics;  Laterality: Right;   TONSILLECTOMY     TOTAL KNEE ARTHROPLASTY Left 04/24/2023   Procedure: TOTAL KNEE ARTHROPLASTY;  Surgeon: Ollen Gross, MD;  Location:  [facility];  Service: Orthopedics;  Laterality: Left;   TUBAL LIGATION     TURBINATE REDUCTION Bilateral 08/15/2022   Procedure: TURBINATE REDUCTION;  Surgeon: Newman Pies, MD;  Location: [facility];  Service: ENT;  Laterality: Bilateral;   WRIST SURGERY     left, removal of cyst   Patient Active Problem List   Diagnosis Date Noted   Screening for osteoporosis 10/04/2022   Stage 3a chronic kidney disease (HCC) 10/04/2022   Vocal fold paresis, left 04/21/2022   Laryngospasms 04/21/2022   Muscle tension dysphonia 04/21/2022   Old tear of meniscus of left knee 09/28/2021   Combined hyperlipidemia associated with type 2 diabetes mellitus (HCC) 09/28/2021   Gout 07/19/2021   S/P shoulder replacement, right 12/14/2020   Rotator cuff tear arthropathy 10/22/2020   Nonischemic cardiomyopathy (HCC) 10/06/2020   Congestive heart failure with left ventricular systolic dysfunction (HCC) 08/24/2020   Primary osteoarthritis of left shoulder 07/29/2020   DDD (degenerative disc disease), cervical 04/30/2020   Osteoarthritis of left knee 11/05/2018   Chronic pansinusitis 10/04/2017   Chronic low back pain 09/05/2017   Degenerative lumbar disc 06/04/2015   S/P lumbar laminectomy 06/04/2015   Restless leg syndrome 05/13/2014   Type 2 diabetes mellitus with peripheral neuropathy (HCC) 04/09/2013   Spinal stenosis of lumbar region without neurogenic claudication 09/12/2012   GERD (gastroesophageal reflux disease) 05/25/2011   Hemorrhoids, internal, with bleeding 05/25/2011   Hypertension associated with diabetes (HCC) 11/08/2010   Chronic allergic rhinitis 08/19/2010   Mitral valve prolapse 10/10/2008   REFERRING PROVIDER: Eartha Inch, PA   REFERRING DIAG: Unilateral primary osteoarthritis, left knee   THERAPY DIAG:  Acute pain of left knee  Stiffness of left knee, not elsewhere classified  Localized edema  Rationale for Evaluation and Treatment: Rehabilitation  ONSET DATE:  04/24/23  SUBJECTIVE:   SUBJECTIVE STATEMENT:  Patient reports a resting pain-level of a 6 today.   PERTINENT HISTORY: Allergies, hypertension, diabetes, congestive heart failure, osteoarthritis, chronic low back pain, chronic kidney disease, anxiety, claustrophobia, and fibromyalgia PAIN:  Are you having pain? Yes: NPRS scale: 6/10 Pain location: left knee Pain description: constant aching, throbbing, and burning  Aggravating factors: standing, walking, and moving her knee  Relieving factors: ice and medication   PRECAUTIONS: None  RED FLAGS: None   WEIGHT BEARING RESTRICTIONS: No  FALLS:  Has patient fallen in last 6 months? No  LIVING ENVIRONMENT: Lives with: lives with their family Lives in: House/apartment Stairs: Yes: Internal: 14 steps; on right going up and External: 1 steps; none Has following equipment at home: Walker - 2 wheeled  OCCUPATION: retired  PLOF: Independent  PATIENT GOALS: be able to paint the last two rooms of her house, be able to do her yard work, reduced pain, and improved mobility  NEXT MD VISIT: 05/10/23  OBJECTIVE: all objective measures were assessed at her initial evaluation on 04/27/23 unless otherwise  noted  PATIENT SURVEYS:  FOTO 12.20  COGNITION: Overall cognitive status: Within functional limits for tasks assessed     SENSATION: Patient reports no numbness or tingling  EDEMA:  Circumferential: L tibiofemoral joint line: 53.3 cn R tibiofemoral joint line: 49.0 cm  POSTURE: forward head and flexed trunk   PALPATION: TTP: left quadriceps, hip adductors, hamstrings, medial and lateral tibiofemoral joint lines  LOWER EXTREMITY ROM:  Active ROM Right eval Left eval 05/09/23  Hip flexion     Hip extension     Hip abduction     Hip adduction     Hip internal rotation     Hip external rotation     Knee flexion 133 39/ 43 (PROM) 68 degrees(passive)  Knee extension 0 20 -14 degrees (passive).  Ankle dorsiflexion     Ankle  plantarflexion     Ankle inversion     Ankle eversion      (Blank rows = not tested)  LOWER EXTREMITY MMT: not assessed due to surgical condition  LOWER EXTREMITY SPECIAL TESTS:  Not tested due to surgical condition  GAIT: Assistive device utilized: Environmental consultant - 2 wheeled and Wheelchair (manual) Level of assistance: Modified independence and Total A Comments: Patient utilized a shuffling pattern with the FWW, but utilized for long distance mobility    TODAY'S TREATMENT:                                                                                                                              DATE:   05/09/23:                                       EXERCISE LOG  Exercise Repetitions and Resistance Comments  Nustep Level 3 moving seat forward x 2 to increase knee flexion x 15 minutes.   2# SAQ's 3 minutes               In supine and seated:  Low load long duration stretching into flexion and extension x 10 minutes f/b elevation and vasopneumatic x 15 minutes.                                   05/04/23 EXERCISE LOG  Exercise Repetitions and Resistance Comments  Nustep  L2 x 16 minutes; seat 9   Supine ankle pump  3 mins LLE only   Supine heel slide Heel Glide x 3 mins LLE only   Supine quad set  2 minutes w/ 5 second hold  LLE only   Supine ham set 2 mins w/ 5 sec hold   Supine SAQ    Seated Hip Abduction 3 mins w/ 3 sec hold   Seated Hip Adduction Red tband 3 mins w/ 3 sec hold    Blank cell = exercise not  performed today  Modalities: no redness or adverse reaction to today's modalities   Date:  Vaso: Knee, 34 degrees; low pressure, 15 mins, Pain and Edema   PATIENT EDUCATION:  Education details: HEP, plan of care, prognosis, healing, and goals for therapy Person educated: Patient Education method: Explanation Education comprehension: verbalized understanding  HOME EXERCISE PROGRAM: [id number]  ASSESSMENT:  CLINICAL IMPRESSION: The patient reports her knee hurt a  lot with movement.  She was able to achieve passive left knee flexion to 68 degrees today.  She is only able to tolerate a low load long duration type of stretch today.  Her range of motion appears to be mostly related to pain at this point.      OBJECTIVE IMPAIRMENTS: Abnormal gait, decreased activity tolerance, decreased mobility, difficulty walking, decreased ROM, decreased strength, hypomobility, increased edema, impaired tone, and pain.   ACTIVITY LIMITATIONS: carrying, lifting, sitting, standing, squatting, stairs, transfers, bed mobility, bathing, dressing, locomotion level, and caring for others  PARTICIPATION LIMITATIONS: meal prep, cleaning, laundry, driving, shopping, community activity, and yard work  PERSONAL FACTORS: Past/current experiences, Transportation, and 3+ comorbidities: Allergies, hypertension, diabetes, congestive heart failure, osteoarthritis, chronic low back pain, chronic kidney disease, anxiety, claustrophobia, and fibromyalgia  are also affecting patient's functional outcome.   REHAB POTENTIAL: Good  CLINICAL DECISION MAKING: Evolving/moderate complexity  EVALUATION COMPLEXITY: Moderate   GOALS: Goals reviewed with patient? Yes  SHORT TERM GOALS: Target date: 05/18/23 Patient will be independent with her initial HEP. Baseline: Goal status: INITIAL  2.  Patient will be able to demonstrate active left knee extension within 10 degrees of neutral for improved knee mobility.  Baseline:  Goal status: INITIAL  3.  Patient will be able to demonstrate active left knee flexion to at least 90 degrees for improved function with transfers. Baseline:  Goal status: INITIAL  4.  Patient will be able to safely ambulate community distances with a walker with the least restrictive assistive device for improved community mobility. Baseline:  Goal status: INITIAL  LONG TERM GOALS: Target date: 06/08/23  Patient will be independent with her advanced HEP. Baseline:   Goal status: INITIAL  2.  Patient will be able to demonstrate active left knee extension within 5 degrees of neutral for improved gait mechanics. Baseline:  Goal status: INITIAL  3.  Patient will improve her active left knee flexion to at least 120 degrees for improved function navigating stairs. Baseline:  Goal status: INITIAL  4.  Patient will be able to safely ambulate with minimal to no gait deviations for improved mobility. Baseline:  Goal status: INITIAL  5.  Patient will be able to transfer from sitting to standing with minimal to no difficulty for improved household mobility. Baseline:  Goal status: INITIAL  PLAN:  PT FREQUENCY: 2-3x/week  PT DURATION: 6 weeks  PLANNED INTERVENTIONS: Therapeutic exercises, Therapeutic activity, Neuromuscular re-education, Balance training, Gait training, Patient/Family education, Self Care, Joint mobilization, Stair training, Electrical stimulation, Cryotherapy, Moist heat, Vasopneumatic device, Manual therapy, and Re-evaluation  PLAN FOR NEXT SESSION: Nustep, review and update HEP as needed, manual therapy, gait training, and modalities as needed   Harel Repetto, [place], PT 05/09/2023, 10:27 AM

## 2023-05-10 NOTE — Progress Notes (Unsigned)
Cardiology Office Note:    Date:  05/10/2023   ID:  [name], DOB 08/21/1945, MRN [id number]  PCP:  Willow Ora, MD   [facility]  Cardiologist:  Meriam Sprague, MD  Advanced Practice Provider:  No care team member to display Electrophysiologist:  None   Referring MD: Willow Ora, MD    History of Present Illness:    [name] is a 77 y.o. 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 She now presents to clinic for follow-up.   Patient was diagnosed with new onset cardiomyopathy in 08/2020 when she presented with SOB found to have COVID at that time. TTE with LVEF 20-25%. Was initially stable and discharged home but returned to [facility] on 08/10/2020 with progressive shortness of breath and LE edema requiring ICU admission and intubation. She was aggressively diuresed and extubated and evenutally weaned to RA. Initial weight was 205lbs and she was diuresed to 187lbs. She was discharged home on lasix 40mg  daily.   After discharge, we arranged for coronary angiography on 09/07/20 for work-up of newly diagnosed HFrEF which revealed no significant obstructive disease. She returned to clinic on 09/21/20 where she felt overall improved. Had DOE with stairs. Wt was stable and blood pressure was well controlled. Repeat TTE 10/15/20 with mild improvement in LVEF to 30-35%, G2DD, elevated LAP, normal RA, mildly dilated LA, mild MR. Cardiac monitor that was placed for frequent ventricular ectopy revealed several runs of NSVT and frequent PVCs but no sustained arrhythmias or Afib.  During visit on 10/20/20, she was overall feeling better. She was euvolemic. We increased her entresto and started spironolactone. Echo 01/27/21 LVEF 45%, RV normal  size and function, trivial MR.   Last seen in clinic 10/2021 where she was doing well from a CV standpoint.   Today, the patient is here for preoperative evaluation prior to sinus surgery/ Her surgery 08/13/2022 will be for recurrent sinus infections.and acute sinusitis. She has struggled with sinus issues for years. She follows with Dr. Suszanne Conners.  From a CV standpoint, she is doing very well. No chest pain, SOB, orthopnea or PND. Tolerating medications. She monitors her weight at home and has not noticed fluid retention. Lately her LE edema has been managed with taking her Lasix on Mondays, Wednesdays, Fridays, and Saturdays.  In clinic today her blood pressure is 138/78. At her last visit her coreg was increased. However, due to having lower blood pressures she returned to taking her previous dose.  She denies any palpitations, or chest pain. No lightheadedness, headaches, syncope, orthopnea, or PND.  The pt was previously followed by Jon Billings    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) 08/24/2020   Diabetes mellitus without complication (HCC)    takes Trulicity,Jardiance,and Metformin daily.Average fasting blood sugar runs around130   Dysrhythmia    PVCs, has MVP   Fibromyalgia    GERD (gastroesophageal reflux disease)    takes Omeprazole daily   Gout 07/19/2021   On allopurinol   History of bronchitis >  8 yrs ago   History of shingles    HTN (hypertension)    takes Amlodipine and Micardis daily   Hx of colonic polyps    benign   Internal and external hemorrhoids without complication    Joint pain    Mitral valve prolapse    Neuromuscular disorder (HCC)    restless leg syndrome   Nocturia    Pneumonia    PONV (postoperative nausea and vomiting)    when gets  injections in joints gets hives.Betadine rash   Restless leg syndrome    takes Requip at bedtime   Seasonal allergies    takes Claritin daily as needed   Urinary frequency    Uterine fibroid     Past Surgical History:  Procedure Laterality Date   BUNIONECTOMY Bilateral    COLONOSCOPY  07/12/2004   diverticulosis, internal and external hemorrhoids   ETHMOIDECTOMY Bilateral 08/15/2022   Procedure: ETHMOIDECTOMY WITH TISSUE REMOVAL;  Surgeon: Newman Pies, MD;  Location: [facility];  Service: ENT;  Laterality: Bilateral;   FINGER ARTHROSCOPY WITH CARPOMETACARPEL (CMC) ARTHROPLASTY Right 09/28/2015   Procedure: RIGHT THUMB TRAPEZIUM EXCISION WITH CARPOMETACARPEL (CMC) ARTHROPLASTY AND TENDON TRANSFER;  Surgeon: Bradly Bienenstock, MD;  Location: [facility];  Service: Orthopedics;  Laterality: Right;   FRONTAL SINUS EXPLORATION Bilateral 08/15/2022   Procedure: FRONTAL RECESS SINUS EXPLORATION WITH NAVIGATION;  Surgeon: Newman Pies, MD;  Location: [facility];  Service: ENT;  Laterality: Bilateral;   HEMORRHOID SURGERY     almost 40 yrs ago   HYSTEROSCOPY WITH D & C  08/23/2000   and resectoscopic myomectomy   LUMBAR DISC SURGERY  03/16/2005   LUMBAR EPIDURAL INJECTION     MAXILLARY ANTROSTOMY Bilateral 08/15/2022   Procedure: ENDOSCOPIC MAXILLARY ANTROSTOMY WITH TISSUE REMOVAL;  Surgeon: Newman Pies, MD;  Location: [facility];  Service: ENT;  Laterality: Bilateral;   PLANTAR FASCIA RELEASE Left 02/03/2010   and torn tendon   REVERSE SHOULDER ARTHROPLASTY Right 12/14/2020   Procedure: REVERSE SHOULDER ARTHROPLASTY;  Surgeon: Beverely Low, MD;  Location: [facility];  Service: Orthopedics;  Laterality: Right;   RIGHT/LEFT HEART CATH AND CORONARY ANGIOGRAPHY N/A 09/07/2020   Procedure: RIGHT/LEFT HEART CATH AND CORONARY ANGIOGRAPHY;  Surgeon: Yvonne Kendall, MD;  Location: [facility];  Service: Cardiovascular;  Laterality: N/A;   SINUS ENDO W/FUSION Bilateral  08/15/2022   Procedure: SPHENOIDECTOMY WITH TISSUE REMOVAL WITH NAVIGATION;  Surgeon: Newman Pies, MD;  Location: [facility];  Service: ENT;  Laterality: Bilateral;   TENDON TRANSFER Right 09/28/2015   Procedure: TENDON TRANSFER;  Surgeon: Bradly Bienenstock, MD;  Location: [facility];  Service: Orthopedics;  Laterality: Right;   TONSILLECTOMY     TOTAL KNEE ARTHROPLASTY Left 04/24/2023   Procedure: TOTAL KNEE ARTHROPLASTY;  Surgeon: Ollen Gross, MD;  Location: [facility];  Service: Orthopedics;  Laterality: Left;   TUBAL LIGATION     TURBINATE REDUCTION Bilateral 08/15/2022   Procedure: TURBINATE REDUCTION;  Surgeon: Newman Pies, MD;  Location: [facility];  Service: ENT;  Laterality: Bilateral;   WRIST SURGERY     left, removal of cyst    Current Medications: No outpatient medications have been marked as taking for the 05/11/23 encounter (Appointment) with Pricilla Riffle, MD.     Allergies:   Amlodipine, Amoxicillin-pot clavulanate, Depo-medrol [methylprednisolone], Gabapentin, Levothyroxine, Sitagliptin, Amoxicillin, Codeine, Corticosteroids, Tetracyclines & related, Ace inhibitors, and Dexamethasone   Social History   Socioeconomic History   Marital status: Married  Spouse name: Not on file   Number of children: 3   Years of education: Not on file   Highest education level: Associate degree: occupational, Scientist, product/process development, or vocational program  Occupational History   Occupation: Retired   Tobacco Use   Smoking status: Never   Smokeless tobacco: Never  Vaping Use   Vaping status: Never Used  Substance and Sexual Activity   Alcohol use: No    Alcohol/week: 0.0 standard drinks of alcohol   Drug use: No   Sexual activity: Yes  Other Topics Concern   Not on file  Social History Narrative   1 caffeine drinks daily         Social Determinants of Health   Financial Resource Strain: Low Risk  (05/09/2023)   Overall Financial Resource Strain (CARDIA)    Difficulty of  Paying Living Expenses: Not hard at all  Food Insecurity: No Food Insecurity (05/09/2023)   Hunger Vital Sign    Worried About Running Out of Food in the Last Year: Never true    Ran Out of Food in the Last Year: Never true  Transportation Needs: No Transportation Needs (05/09/2023)   PRAPARE - Administrator, Civil Service (Medical): No    Lack of Transportation (Non-Medical): No  Physical Activity: Insufficiently Active (05/09/2023)   Exercise Vital Sign    Days of Exercise per Week: 2 days    Minutes of Exercise per Session: 40 min  Stress: No Stress Concern Present (05/09/2023)   [employer] of Occupational Health - Occupational Stress Questionnaire    Feeling of Stress : Not at all  Social Connections: Moderately Integrated (05/09/2023)   Social Connection and Isolation Panel [NHANES]    Frequency of Communication with Friends and Family: More than three times a week    Frequency of Social Gatherings with Friends and Family: Twice a week    Attends Religious Services: More than 4 times per year    Active Member of [employer] or Organizations: No    Attends Banker Meetings: Never    Marital Status: Married     Family History: The patient's family history includes Pancreatic cancer in her father. There is no history of Colon cancer or Breast cancer.  ROS:   Review of Systems  Constitutional:  Negative for chills and fever.  HENT:  Negative for nosebleeds and tinnitus.   Eyes:  Negative for blurred vision and pain.  Respiratory:  Positive for shortness of breath. Negative for cough, hemoptysis and stridor.   Cardiovascular:  Negative for chest pain, palpitations, orthopnea, claudication, leg swelling and PND.  Gastrointestinal:  Negative for blood in stool, diarrhea, nausea and vomiting.  Genitourinary:  Negative for dysuria and hematuria.  Musculoskeletal:  Negative for falls.  Neurological:  Negative for dizziness, loss of consciousness and headaches.   Psychiatric/Behavioral:  Negative for depression, hallucinations and substance abuse. The patient does not have insomnia.     EKGs/Labs/Other Studies Reviewed:    The following studies were reviewed today:  TTE 12/2021: IMPRESSIONS   1. Left ventricular ejection fraction, by estimation, is 45 to 50%. The  left ventricle has mildly decreased function. The left ventricle  demonstrates global hypokinesis. Left ventricular diastolic parameters are  consistent with Grade I diastolic  dysfunction (impaired relaxation).   2. Right ventricular systolic function is normal. The right ventricular  size is normal. Tricuspid regurgitation signal is inadequate for assessing  PA pressure.   3. The mitral valve is grossly normal. Trivial mitral  valve  regurgitation. No evidence of mitral stenosis.   4. The aortic valve is tricuspid. Aortic valve regurgitation is not  visualized. No aortic stenosis is present.   5. The inferior vena cava is normal in size with greater than 50%  respiratory variability, suggesting right atrial pressure of 3 mmHg.   Comparison(s): No significant change from prior study.   TTE 01/26/21 1. Left ventricular ejection fraction, by visual estimation, is 45%. The left ventricle has mildly decreased function. The average left ventricular global longitudinal strain is -18.5 % (with resoanable trace and only slight foreshortening) suggestive of near normal LV function. The left ventricle demonstrates global hypokinesis, though anterolateral walls and inferolateral walls  hypokinesis is most prominent. Left ventricular diastolic parameters are indeterminate in this study.   2. Right ventricular systolic function is normal. The right ventricular size is normal. Tricuspid regurgitation signal is inadequate for assessing PA pressure.   3. The mitral valve is grossly normal. Trivial mitral valve  regurgitation. No evidence of mitral stenosis.   4. The aortic valve is tricuspid.  Aortic valve regurgitation is not  visualized. No aortic stenosis is present.   TTE 10/15/20: 1. Left ventricular ejection fraction, by estimation, is 30 to 35%. The left ventricle has moderately decreased function. The left ventricle demonstrates global hypokinesis. Left ventricular diastolic parameters are consistent with Grade II diastolic dysfunction (pseudonormalization). Elevated left atrial pressure. The E/e' is 22.0.   2. Right ventricular systolic function is normal. The right ventricular size is normal. Tricuspid regurgitation signal is inadequate for assessing PA pressure.   3. Left atrial size was mildly dilated.   4. The mitral valve is grossly normal. Mild mitral valve regurgitation. No evidence of mitral stenosis.   5. The aortic valve is tricuspid. Aortic valve regurgitation is not visualized. No aortic stenosis is present.   6. The inferior vena cava is normal in size with greater than 50% respiratory variability, suggesting right atrial pressure of 3 mmHg.    Cardiac Monitor 10/09/20: Patient's monitoring period was from 09/08/20-10/07/20 Predominant rhythm was NSR with frequent PVCs. Average HR 84bpm (ranged from 59-84bpm) There were several runs of NSVT, however, no sustained VT (longest episode was 11 beats) Frequent ventricular ectopy was detected (10%; 314,307 beats) SVE was rare <1% No afib or significant pauses Patient triggered events correlated with PVCs and NSVT.   TTE 08/14/20:  1. Left ventricular ejection fraction, by estimation, is 20 to 25%. The left ventricle has severely decreased function. The left ventricle has no regional wall motion abnormalities. Left ventricular diastolic parameters  are indeterminate.   2. Right ventricular systolic function was not well visualized. The right ventricular size is not well visualized.   3. Left atrial size was moderately dilated.   4. The mitral valve is grossly normal. Mild mitral valve regurgitation. There is a second  jet of mitral regurgitation seen in the four chamber view with eccentric pattern, mitral regurgitation may be underestimated in  this study.   5. The aortic valve is tricuspid. Aortic valve regurgitation is not visualized. No aortic stenosis is present.   6. The inferior vena cava is dilated in size with <50% respiratory variability, suggesting right atrial pressure of 15 mmHg.    TTE 08/04/20 IMPRESSIONS   1. Left ventricular ejection fraction, by estimation, is 20 to 25%. The left ventricle has severely decreased function. The left ventricle demonstrates global hypokinesis. The left ventricular internal cavity size  was mildly to moderately dilated. Left  ventricular diastolic parameters  are consistent with Grade II diastolic dysfunction (pseudonormalization). Elevated left atrial pressure.   2. Right ventricular systolic function is normal. The right ventricular size is normal. There is mildly elevated pulmonary artery systolic pressure. The estimated right ventricular systolic pressure is 37.8 mmHg.   3. Left atrial size was moderately dilated.   4. The mitral valve is degenerative. Mild mitral valve regurgitation. No evidence of mitral stenosis.   5. The aortic valve is tricuspid. There is mild calcification of the aortic valve. There is mild thickening of the aortic valve. Aortic valve regurgitation is not visualized. Mild aortic valve sclerosis is present,  with no evidence of aortic valve stenosis.   6. The inferior vena cava is dilated in size with <50% respiratory variability, suggesting right atrial pressure of 15 mmHg.   FINDINGS   Left Ventricle: Left ventricular ejection fraction, by estimation, is 20  to 25%. The left ventricle has severely decreased function. The left  ventricle demonstrates global hypokinesis. The left ventricular internal  cavity size was mildly to moderately  dilated. There is no left ventricular hypertrophy. Left ventricular  diastolic parameters are  consistent with Grade II diastolic dysfunction  (pseudonormalization). Elevated left atrial pressure.   Right Ventricle: The right ventricular size is normal. No increase in  right ventricular wall thickness. Right ventricular systolic function is  normal. There is mildly elevated pulmonary artery systolic pressure. The  tricuspid regurgitant velocity is 2.39   m/s, and with an assumed right atrial pressure of 15 mmHg, the estimated  right ventricular systolic pressure is 37.8 mmHg.   Left Atrium: Left atrial size was moderately dilated.   Right Atrium: Right atrial size was normal in size.   Pericardium: Trivial pericardial effusion is present.   Mitral Valve: The mitral valve is degenerative in appearance. Mild mitral  annular calcification. Mild mitral valve regurgitation. No evidence of  mitral valve stenosis.   Tricuspid Valve: The tricuspid valve is grossly normal. Tricuspid valve  regurgitation is mild . No evidence of tricuspid stenosis.   Aortic Valve: The aortic valve is tricuspid. There is mild calcification  of the aortic valve. There is mild thickening of the aortic valve. Aortic  valve regurgitation is not visualized. Mild aortic valve sclerosis is  present, with no evidence of aortic  valve stenosis.   Pulmonic Valve: The pulmonic valve was grossly normal. Pulmonic valve  regurgitation is trivial. No evidence of pulmonic stenosis.   Aorta: The aortic root and ascending aorta are structurally normal, with  no evidence of dilitation.   Venous: The inferior vena cava is dilated in size with less than 50%  respiratory variability, suggesting right atrial pressure of 15 mmHg.   IAS/Shunts: The atrial septum is grossly normal.     RHC/LHC 09/07/20: Conclusions: Mild plaquing of the mid LAD with 10-20% stenosis.  Otherwise, no angiographically significant coronary artery disease. Normal left and right heart filling pressures. Borderline elevated pulmonary artery  pressure. Normal Fick cardiac output/index.   Recommendations: Maintain net even fluid balance and optimize goal directed medical therapy for non-ischemic cardiomyopathy. Primary    EKG:  EKG is personally reviewed. 06/09/2022:  Sinus rhythm. PVC. Rate 82 bpm. 08/06/21: NSR with HR 69, 1 degree AVB 11/11/2020: EKG is not ordered today.  11/05/2021: not ordered today    Recent Labs: 10/04/2022: ALT 14; TSH 1.87 04/25/2023: BUN 28; Creatinine, Ser 1.12; Hemoglobin 10.7; Platelets 229; Potassium 5.1; Sodium 133   Recent Lipid Panel    Component Value Date/Time  CHOL 109 10/04/2022 1002   TRIG 102.0 10/04/2022 1002   HDL 57.90 10/04/2022 1002   CHOLHDL 2 10/04/2022 1002   VLDL 20.4 10/04/2022 1002   LDLCALC 31 10/04/2022 1002      Physical Exam:    VS:  There were no vitals taken for this visit.    Wt Readings from Last 3 Encounters:  05/09/23 193 lb (87.5 kg)  04/24/23 193 lb (87.5 kg)  04/13/23 193 lb (87.5 kg)     GEN: Well nourished, well developed in no acute distress HEENT: Normal NECK: No JVD; No carotid bruits CARDIAC: RRR, 1/6 systolic murmur, no rubs, no gallops RESPIRATORY:  Clear to auscultation without rales, wheezing or rhonchi  ABDOMEN: Soft, non-tender, non-distended MUSCULOSKELETAL:  No edema; warm SKIN: Warm and dry NEUROLOGIC:  Alert and oriented x 3 PSYCHIATRIC:  Normal affect   ASSESSMENT:    No diagnosis found.   PLAN:    In order of problems listed above:  #Pre-operative Evaluation: Patient with no known CAD with cath 09/2020 with no obstructive disease. EF has improved to 45%. She is euvolemic and compensated. No further CV work-up prior to surgery. Revised cardiac risk index is 1 with 6% 30 day risk of major CV complication.   #Newly diagnosed HFrEF:  #Non-ischemic cardiomypathy: Diagnosed in the setting of COVID-19 pneumonia. Cath with mild, non-obstructive disease. Initial TTE with LVEF 20-25%-->30-35%-->45%. Currently,  patient is  doing well with NYHA class II symptoms. Euvolemic on exam. -Continue lasix 20mg  4x/wk (M, W, F, Sat) -Continue coreg  3.125 mg BID -Continue entresto to 49/51mg  BID -Continue farxiga 10mg  daily -Continue spironolactone 12.5mg  daily  -Cath without obstructive disease -EF improved, no need for ICD -Continue daily weights and call if gaining >2-3lbs in 2 days or 5lbs in 1 week -Low Na diet -Will look into patient assistance for entresto and farxiga   #Non-obstructive CAD: Cath on 09/07/20 with mild plaquing of the mid LAD with 10-20% stenosis.  Otherwise, no angiographically significant coronary artery disease. -Continue crestor 20mg  daily   #PVCs/NSVT: Cardiac monitor with several runs of NSVT (longest 11 beats) and frequent (10%) burden of PVCs. No sustained arrhythmias. -Continue coreg  3.125 mg BID -Manage HF as above   #CKD: Cr back to baseline. -Lasix as above   #DMII: -Continue farxiga 10mg  daily  Follow-up in 6 months.  Medication Adjustments/Labs and Tests Ordered: Current medicines are reviewed at length with the patient today.  Concerns regarding medicines are outlined above.   No orders of the defined types were placed in this encounter.  No orders of the defined types were placed in this encounter.  There are no Patient Instructions on file for this visit.   I,Mathew Stumpf,acting as a Neurosurgeon for Dietrich Pates, MD.,have documented all relevant documentation on the behalf of Dietrich Pates, MD,as directed by  Dietrich Pates, MD while in the presence of Dietrich Pates, MD.  I, Dietrich Pates, MD, have reviewed all documentation for this visit. The documentation on 05/10/23 for the exam, diagnosis, procedures, and orders are all accurate and complete.   Signed, Dietrich Pates, MD  05/10/2023 8:12 PM    [facility]

## 2023-05-11 ENCOUNTER — Ambulatory Visit: Payer: Medicare Other | Attending: Cardiology | Admitting: Internal Medicine

## 2023-05-11 ENCOUNTER — Encounter: Payer: Self-pay | Admitting: Internal Medicine

## 2023-05-11 ENCOUNTER — Encounter: Payer: Self-pay | Admitting: *Deleted

## 2023-05-11 ENCOUNTER — Ambulatory Visit: Payer: Medicare Other | Admitting: *Deleted

## 2023-05-11 VITALS — BP 142/58 | HR 78 | Ht 64.0 in | Wt 191.0 lb

## 2023-05-11 DIAGNOSIS — I10 Essential (primary) hypertension: Secondary | ICD-10-CM

## 2023-05-11 DIAGNOSIS — M25562 Pain in left knee: Secondary | ICD-10-CM

## 2023-05-11 DIAGNOSIS — Z09 Encounter for follow-up examination after completed treatment for conditions other than malignant neoplasm: Secondary | ICD-10-CM | POA: Diagnosis present

## 2023-05-11 DIAGNOSIS — M25662 Stiffness of left knee, not elsewhere classified: Secondary | ICD-10-CM

## 2023-05-11 DIAGNOSIS — R600 Localized edema: Secondary | ICD-10-CM

## 2023-05-11 NOTE — Patient Instructions (Signed)
Medication Instructions:   *If you need a refill on your cardiac medications before your next appointment, please call your pharmacy*   Lab Work:  If you have labs (blood work) drawn today and your tests are completely normal, you will receive your results only by: MyChart Message (if you have MyChart) OR A paper copy in the mail If you have any lab test that is abnormal or we need to change your treatment, we will call you to review the results.   Testing/Procedures:    Follow-Up: At [facility], you and your health needs are our priority.  As part of our continuing mission to provide you with exceptional heart care, we have created designated Provider Care Teams.  These Care Teams include your primary Cardiologist (physician) and Advanced Practice Providers (APPs -  Physician Assistants and Nurse Practitioners) who all work together to provide you with the care you need, when you need it.  We recommend signing up for the patient portal called "MyChart".  Sign up information is provided on this After Visit Summary.  MyChart is used to connect with patients for Virtual Visits (Telemedicine).  Patients are able to view lab/test results, encounter notes, upcoming appointments, etc.  Non-urgent messages can be sent to your provider as well.   To learn more about what you can do with MyChart, go to [email or web address].    Your next appointment:   8 month(s)  Provider:   Dr Dietrich Pates    Other Instructions

## 2023-05-11 NOTE — Therapy (Signed)
OUTPATIENT PHYSICAL THERAPY LOWER EXTREMITY TREATMENT   Patient Name: [name] MRN: [id number] DOB:1946/06/06, 77 y.o., female Today's Date: 05/11/2023  END OF SESSION:  PT End of Session - 05/11/23 0848     Visit Number 5    Number of Visits 12    Date for PT Re-Evaluation 06/30/23    PT Start Time 0848    PT Stop Time 0939    PT Time Calculation (min) 51 min              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) 08/24/2020   Diabetes mellitus without complication (HCC)    takes Trulicity,Jardiance,and Metformin daily.Average fasting blood sugar runs around130   Dysrhythmia    PVCs, has MVP   Fibromyalgia    GERD (gastroesophageal reflux disease)    takes Omeprazole daily   Gout 07/19/2021   On allopurinol   History of bronchitis > 8 yrs ago   History of shingles    HTN (hypertension)    takes Amlodipine and Micardis daily   Hx of colonic polyps    benign   Internal and external hemorrhoids without complication    Joint pain    Mitral valve prolapse    Neuromuscular disorder (HCC)    restless leg syndrome   Nocturia    Pneumonia    PONV (postoperative nausea and vomiting)    when gets injections in joints gets hives.Betadine rash   Restless leg syndrome    takes Requip at bedtime   Seasonal allergies    takes Claritin daily as needed   Urinary frequency    Uterine fibroid    Past Surgical History:  Procedure Laterality Date   BUNIONECTOMY Bilateral    COLONOSCOPY  07/12/2004   diverticulosis, internal and external hemorrhoids   ETHMOIDECTOMY Bilateral 08/15/2022   Procedure: ETHMOIDECTOMY WITH TISSUE REMOVAL;  Surgeon: Newman Pies, MD;  Location: [facility];  Service: ENT;  Laterality: Bilateral;   FINGER ARTHROSCOPY WITH  CARPOMETACARPEL (CMC) ARTHROPLASTY Right 09/28/2015   Procedure: RIGHT THUMB TRAPEZIUM EXCISION WITH CARPOMETACARPEL (CMC) ARTHROPLASTY AND TENDON TRANSFER;  Surgeon: Bradly Bienenstock, MD;  Location: [facility];  Service: Orthopedics;  Laterality: Right;   FRONTAL SINUS EXPLORATION Bilateral 08/15/2022   Procedure: FRONTAL RECESS SINUS EXPLORATION WITH NAVIGATION;  Surgeon: Newman Pies, MD;  Location: [facility];  Service: ENT;  Laterality: Bilateral;   HEMORRHOID SURGERY     almost 40 yrs ago   HYSTEROSCOPY WITH D & C  08/23/2000   and resectoscopic myomectomy   LUMBAR DISC SURGERY  03/16/2005   LUMBAR EPIDURAL INJECTION     MAXILLARY ANTROSTOMY Bilateral 08/15/2022   Procedure: ENDOSCOPIC MAXILLARY ANTROSTOMY WITH TISSUE REMOVAL;  Surgeon: Newman Pies, MD;  Location: [facility];  Service: ENT;  Laterality: Bilateral;   PLANTAR FASCIA RELEASE Left 02/03/2010   and torn tendon   REVERSE SHOULDER ARTHROPLASTY Right 12/14/2020   Procedure: REVERSE SHOULDER ARTHROPLASTY;  Surgeon: Beverely Low, MD;  Location: [facility];  Service: Orthopedics;  Laterality: Right;   RIGHT/LEFT HEART CATH AND CORONARY ANGIOGRAPHY N/A 09/07/2020   Procedure: RIGHT/LEFT HEART CATH AND CORONARY ANGIOGRAPHY;  Surgeon: Yvonne Kendall, MD;  Location: [facility];  Service: Cardiovascular;  Laterality: N/A;   SINUS  ENDO W/FUSION Bilateral 08/15/2022   Procedure: SPHENOIDECTOMY WITH TISSUE REMOVAL WITH NAVIGATION;  Surgeon: Newman Pies, MD;  Location: [facility];  Service: ENT;  Laterality: Bilateral;   TENDON TRANSFER Right 09/28/2015   Procedure: TENDON TRANSFER;  Surgeon: Bradly Bienenstock, MD;  Location: [facility];  Service: Orthopedics;  Laterality: Right;   TONSILLECTOMY     TOTAL KNEE ARTHROPLASTY Left 04/24/2023   Procedure: TOTAL KNEE ARTHROPLASTY;  Surgeon: Ollen Gross, MD;  Location: [facility];  Service: Orthopedics;  Laterality: Left;   TUBAL LIGATION     TURBINATE REDUCTION Bilateral  08/15/2022   Procedure: TURBINATE REDUCTION;  Surgeon: Newman Pies, MD;  Location: [facility];  Service: ENT;  Laterality: Bilateral;   WRIST SURGERY     left, removal of cyst   Patient Active Problem List   Diagnosis Date Noted   Screening for osteoporosis 10/04/2022   Stage 3a chronic kidney disease (HCC) 10/04/2022   Vocal fold paresis, left 04/21/2022   Laryngospasms 04/21/2022   Muscle tension dysphonia 04/21/2022   Old tear of meniscus of left knee 09/28/2021   Combined hyperlipidemia associated with type 2 diabetes mellitus (HCC) 09/28/2021   Gout 07/19/2021   S/P shoulder replacement, right 12/14/2020   Rotator cuff tear arthropathy 10/22/2020   Nonischemic cardiomyopathy (HCC) 10/06/2020   Congestive heart failure with left ventricular systolic dysfunction (HCC) 08/24/2020   Primary osteoarthritis of left shoulder 07/29/2020   DDD (degenerative disc disease), cervical 04/30/2020   Osteoarthritis of left knee 11/05/2018   Chronic pansinusitis 10/04/2017   Chronic low back pain 09/05/2017   Degenerative lumbar disc 06/04/2015   S/P lumbar laminectomy 06/04/2015   Restless leg syndrome 05/13/2014   Type 2 diabetes mellitus with peripheral neuropathy (HCC) 04/09/2013   Spinal stenosis of lumbar region without neurogenic claudication 09/12/2012   GERD (gastroesophageal reflux disease) 05/25/2011   Hemorrhoids, internal, with bleeding 05/25/2011   Hypertension associated with diabetes (HCC) 11/08/2010   Chronic allergic rhinitis 08/19/2010   Mitral valve prolapse 10/10/2008   REFERRING PROVIDER: Eartha Inch, PA   REFERRING DIAG: Unilateral primary osteoarthritis, left knee   THERAPY DIAG:  Acute pain of left knee  Stiffness of left knee, not elsewhere classified  Localized edema  Rationale for Evaluation and Treatment: Rehabilitation  ONSET DATE: 04/24/23  SUBJECTIVE:   SUBJECTIVE STATEMENT:  Patient reports a resting pain-level of a 6 today.  MD f/u went well  PERTINENT HISTORY: Allergies, hypertension, diabetes, congestive heart failure, osteoarthritis, chronic low back pain, chronic kidney disease, anxiety, claustrophobia, and fibromyalgia PAIN:  Are you having pain? Yes: NPRS scale: 6/10 Pain location: left knee Pain description: constant aching, throbbing, and burning  Aggravating factors: standing, walking, and moving her knee  Relieving factors: ice and medication   PRECAUTIONS: None  RED FLAGS: None   WEIGHT BEARING RESTRICTIONS: No  FALLS:  Has patient fallen in last 6 months? No  LIVING ENVIRONMENT: Lives with: lives with their family Lives in: House/apartment Stairs: Yes: Internal: 14 steps; on right going up and External: 1 steps; none Has following equipment at home: Walker - 2 wheeled  OCCUPATION: retired  PLOF: Independent  PATIENT GOALS: be able to paint the last two rooms of her house, be able to do her yard work, reduced pain, and improved mobility  NEXT MD VISIT: 05/10/23  OBJECTIVE: all objective measures were assessed at her initial evaluation on 04/27/23 unless otherwise noted  PATIENT SURVEYS:  FOTO 12.20  COGNITION: Overall cognitive status: Within functional limits for  tasks assessed     SENSATION: Patient reports no numbness or tingling  EDEMA:  Circumferential: L tibiofemoral joint line: 53.3 cn R tibiofemoral joint line: 49.0 cm  POSTURE: forward head and flexed trunk   PALPATION: TTP: left quadriceps, hip adductors, hamstrings, medial and lateral tibiofemoral joint lines  LOWER EXTREMITY ROM:  Active ROM Right eval Left eval 05/09/23  Hip flexion     Hip extension     Hip abduction     Hip adduction     Hip internal rotation     Hip external rotation     Knee flexion 133 39/ 43 (PROM) 68 degrees(passive)  Knee extension 0 20 -14 degrees (passive).  Ankle dorsiflexion     Ankle plantarflexion     Ankle inversion     Ankle eversion      (Blank rows = not  tested)  LOWER EXTREMITY MMT: not assessed due to surgical condition  LOWER EXTREMITY SPECIAL TESTS:  Not tested due to surgical condition  GAIT: Assistive device utilized: Environmental consultant - 2 wheeled and Wheelchair (manual) Level of assistance: Modified independence and Total A Comments: Patient utilized a shuffling pattern with the FWW, but utilized for long distance mobility    TODAY'S TREATMENT:                                                                                                                              DATE:   05/11/23:                                       EXERCISE LOG    LT TKR  Exercise Repetitions and Resistance Comments  Nustep Level 3 moving seat forward x 2 to increase knee flexion x 15 minutes.   2# SAQ's    LAQ's X 10 hold 5 secs   Standing Heel ups/ toe ups 3x10 each           In supine and seated:  Low load long duration stretching into flexion and extension x 10 minutes   elevation and vasopneumatic x 15 minutes LT knee                                   05/04/23 EXERCISE LOG  Exercise Repetitions and Resistance Comments  Nustep  L2 x 16 minutes; seat 9   Supine ankle pump  3 mins LLE only   Supine heel slide Heel Glide x 3 mins LLE only   Supine quad set  2 minutes w/ 5 second hold  LLE only   Supine ham set 2 mins w/ 5 sec hold   Supine SAQ    Seated Hip Abduction 3 mins w/ 3 sec hold   Seated Hip Adduction Red tband 3 mins w/ 3 sec hold    Blank  cell = exercise not performed today  Modalities: no redness or adverse reaction to today's modalities   Date:  Vaso: Knee, 34 degrees; low pressure, 15 mins, Pain and Edema   PATIENT EDUCATION:  Education details: HEP, plan of care, prognosis, healing, and goals for therapy Person educated: Patient Education method: Explanation Education comprehension: verbalized understanding  HOME EXERCISE PROGRAM: [id number]  ASSESSMENT:  CLINICAL IMPRESSION:  Pt reports f/u with MD  went well. Rx focused  on progression of ROM as well as quad activaion. Manual patella mobs and PROM performed with soreness and pain during , but tolerated fairly well. Vaso end of session for edema control.       OBJECTIVE IMPAIRMENTS: Abnormal gait, decreased activity tolerance, decreased mobility, difficulty walking, decreased ROM, decreased strength, hypomobility, increased edema, impaired tone, and pain.   ACTIVITY LIMITATIONS: carrying, lifting, sitting, standing, squatting, stairs, transfers, bed mobility, bathing, dressing, locomotion level, and caring for others  PARTICIPATION LIMITATIONS: meal prep, cleaning, laundry, driving, shopping, community activity, and yard work  PERSONAL FACTORS: Past/current experiences, Transportation, and 3+ comorbidities: Allergies, hypertension, diabetes, congestive heart failure, osteoarthritis, chronic low back pain, chronic kidney disease, anxiety, claustrophobia, and fibromyalgia  are also affecting patient's functional outcome.   REHAB POTENTIAL: Good  CLINICAL DECISION MAKING: Evolving/moderate complexity  EVALUATION COMPLEXITY: Moderate   GOALS: Goals reviewed with patient? Yes  SHORT TERM GOALS: Target date: 05/18/23 Patient will be independent with her initial HEP. Baseline: Goal status: INITIAL  2.  Patient will be able to demonstrate active left knee extension within 10 degrees of neutral for improved knee mobility.  Baseline:  Goal status: INITIAL  3.  Patient will be able to demonstrate active left knee flexion to at least 90 degrees for improved function with transfers. Baseline:  Goal status: INITIAL  4.  Patient will be able to safely ambulate community distances with a walker with the least restrictive assistive device for improved community mobility. Baseline:  Goal status: INITIAL  LONG TERM GOALS: Target date: 06/08/23  Patient will be independent with her advanced HEP. Baseline:  Goal status: INITIAL  2.  Patient will be able to  demonstrate active left knee extension within 5 degrees of neutral for improved gait mechanics. Baseline:  Goal status: INITIAL  3.  Patient will improve her active left knee flexion to at least 120 degrees for improved function navigating stairs. Baseline:  Goal status: INITIAL  4.  Patient will be able to safely ambulate with minimal to no gait deviations for improved mobility. Baseline:  Goal status: INITIAL  5.  Patient will be able to transfer from sitting to standing with minimal to no difficulty for improved household mobility. Baseline:  Goal status: INITIAL  PLAN:  PT FREQUENCY: 2-3x/week  PT DURATION: 6 weeks  PLANNED INTERVENTIONS: Therapeutic exercises, Therapeutic activity, Neuromuscular re-education, Balance training, Gait training, Patient/Family education, Self Care, Joint mobilization, Stair training, Electrical stimulation, Cryotherapy, Moist heat, Vasopneumatic device, Manual therapy, and Re-evaluation  PLAN FOR NEXT SESSION: Nustep, review and update HEP as needed, manual therapy, gait training, and modalities as needed   Traver Meckes,CHRIS, PTA 05/11/2023, 9:39 AM

## 2023-05-12 ENCOUNTER — Other Ambulatory Visit: Payer: Self-pay | Admitting: Internal Medicine

## 2023-05-12 MED ORDER — ROSUVASTATIN CALCIUM 20 MG PO TABS: 20.0000 mg | ORAL_TABLET | Freq: Every day | ORAL | 3 refills | Status: DC

## 2023-05-12 MED ORDER — ROSUVASTATIN CALCIUM 20 MG PO TABS: 20.0000 mg | ORAL_TABLET | Freq: Every day | ORAL | 0 refills | Status: DC

## 2023-05-12 NOTE — Addendum Note (Signed)
Addended by: Burnetta Sabin on: 05/12/2023 03:08 PM   Modules accepted: Orders

## 2023-05-15 ENCOUNTER — Ambulatory Visit: Payer: Medicare Other

## 2023-05-15 DIAGNOSIS — M25662 Stiffness of left knee, not elsewhere classified: Secondary | ICD-10-CM

## 2023-05-15 DIAGNOSIS — M25562 Pain in left knee: Secondary | ICD-10-CM | POA: Diagnosis not present

## 2023-05-15 DIAGNOSIS — R600 Localized edema: Secondary | ICD-10-CM

## 2023-05-15 NOTE — Therapy (Signed)
OUTPATIENT PHYSICAL THERAPY LOWER EXTREMITY TREATMENT   Patient Name: [name] MRN: [id number] DOB:1945-11-25, 77 y.o., female Today's Date: 05/15/2023  END OF SESSION:  PT End of Session - 05/15/23 0935     Visit Number 6    Number of Visits 12    Date for PT Re-Evaluation 06/30/23    PT Start Time 0930    PT Stop Time 1030    PT Time Calculation (min) 60 min              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) 08/24/2020   Diabetes mellitus without complication (HCC)    takes Trulicity,Jardiance,and Metformin daily.Average fasting blood sugar runs around130   Dysrhythmia    PVCs, has MVP   Fibromyalgia    GERD (gastroesophageal reflux disease)    takes Omeprazole daily   Gout 07/19/2021   On allopurinol   History of bronchitis > 8 yrs ago   History of shingles    HTN (hypertension)    takes Amlodipine and Micardis daily   Hx of colonic polyps    benign   Internal and external hemorrhoids without complication    Joint pain    Mitral valve prolapse    Neuromuscular disorder (HCC)    restless leg syndrome   Nocturia    Pneumonia    PONV (postoperative nausea and vomiting)    when gets injections in joints gets hives.Betadine rash   Restless leg syndrome    takes Requip at bedtime   Seasonal allergies    takes Claritin daily as needed   Urinary frequency    Uterine fibroid    Past Surgical History:  Procedure Laterality Date   BUNIONECTOMY Bilateral    COLONOSCOPY  07/12/2004   diverticulosis, internal and external hemorrhoids   ETHMOIDECTOMY Bilateral 08/15/2022   Procedure: ETHMOIDECTOMY WITH TISSUE REMOVAL;  Surgeon: Newman Pies, MD;  Location: [facility];  Service: ENT;  Laterality: Bilateral;   FINGER ARTHROSCOPY WITH  CARPOMETACARPEL (CMC) ARTHROPLASTY Right 09/28/2015   Procedure: RIGHT THUMB TRAPEZIUM EXCISION WITH CARPOMETACARPEL (CMC) ARTHROPLASTY AND TENDON TRANSFER;  Surgeon: Bradly Bienenstock, MD;  Location: [facility];  Service: Orthopedics;  Laterality: Right;   FRONTAL SINUS EXPLORATION Bilateral 08/15/2022   Procedure: FRONTAL RECESS SINUS EXPLORATION WITH NAVIGATION;  Surgeon: Newman Pies, MD;  Location: [facility];  Service: ENT;  Laterality: Bilateral;   HEMORRHOID SURGERY     almost 40 yrs ago   HYSTEROSCOPY WITH D & C  08/23/2000   and resectoscopic myomectomy   LUMBAR DISC SURGERY  03/16/2005   LUMBAR EPIDURAL INJECTION     MAXILLARY ANTROSTOMY Bilateral 08/15/2022   Procedure: ENDOSCOPIC MAXILLARY ANTROSTOMY WITH TISSUE REMOVAL;  Surgeon: Newman Pies, MD;  Location: [facility];  Service: ENT;  Laterality: Bilateral;   PLANTAR FASCIA RELEASE Left 02/03/2010   and torn tendon   REVERSE SHOULDER ARTHROPLASTY Right 12/14/2020   Procedure: REVERSE SHOULDER ARTHROPLASTY;  Surgeon: Beverely Low, MD;  Location: [facility];  Service: Orthopedics;  Laterality: Right;   RIGHT/LEFT HEART CATH AND CORONARY ANGIOGRAPHY N/A 09/07/2020   Procedure: RIGHT/LEFT HEART CATH AND CORONARY ANGIOGRAPHY;  Surgeon: Yvonne Kendall, MD;  Location: [facility];  Service: Cardiovascular;  Laterality: N/A;   SINUS  ENDO W/FUSION Bilateral 08/15/2022   Procedure: SPHENOIDECTOMY WITH TISSUE REMOVAL WITH NAVIGATION;  Surgeon: Newman Pies, MD;  Location: [facility];  Service: ENT;  Laterality: Bilateral;   TENDON TRANSFER Right 09/28/2015   Procedure: TENDON TRANSFER;  Surgeon: Bradly Bienenstock, MD;  Location: [facility];  Service: Orthopedics;  Laterality: Right;   TONSILLECTOMY     TOTAL KNEE ARTHROPLASTY Left 04/24/2023   Procedure: TOTAL KNEE ARTHROPLASTY;  Surgeon: Ollen Gross, MD;  Location: [facility];  Service: Orthopedics;  Laterality: Left;   TUBAL LIGATION     TURBINATE REDUCTION Bilateral  08/15/2022   Procedure: TURBINATE REDUCTION;  Surgeon: Newman Pies, MD;  Location: [facility];  Service: ENT;  Laterality: Bilateral;   WRIST SURGERY     left, removal of cyst   Patient Active Problem List   Diagnosis Date Noted   Screening for osteoporosis 10/04/2022   Stage 3a chronic kidney disease (HCC) 10/04/2022   Vocal fold paresis, left 04/21/2022   Laryngospasms 04/21/2022   Muscle tension dysphonia 04/21/2022   Old tear of meniscus of left knee 09/28/2021   Combined hyperlipidemia associated with type 2 diabetes mellitus (HCC) 09/28/2021   Gout 07/19/2021   S/P shoulder replacement, right 12/14/2020   Rotator cuff tear arthropathy 10/22/2020   Nonischemic cardiomyopathy (HCC) 10/06/2020   Congestive heart failure with left ventricular systolic dysfunction (HCC) 08/24/2020   Primary osteoarthritis of left shoulder 07/29/2020   DDD (degenerative disc disease), cervical 04/30/2020   Osteoarthritis of left knee 11/05/2018   Chronic pansinusitis 10/04/2017   Chronic low back pain 09/05/2017   Degenerative lumbar disc 06/04/2015   S/P lumbar laminectomy 06/04/2015   Restless leg syndrome 05/13/2014   Type 2 diabetes mellitus with peripheral neuropathy (HCC) 04/09/2013   Spinal stenosis of lumbar region without neurogenic claudication 09/12/2012   GERD (gastroesophageal reflux disease) 05/25/2011   Hemorrhoids, internal, with bleeding 05/25/2011   Hypertension associated with diabetes (HCC) 11/08/2010   Chronic allergic rhinitis 08/19/2010   Mitral valve prolapse 10/10/2008   REFERRING PROVIDER: Eartha Inch, PA   REFERRING DIAG: Unilateral primary osteoarthritis, left knee   THERAPY DIAG:  Acute pain of left knee  Stiffness of left knee, not elsewhere classified  Localized edema  Rationale for Evaluation and Treatment: Rehabilitation  ONSET DATE: 04/24/23  SUBJECTIVE:   SUBJECTIVE STATEMENT:  Pt reports 6/10 left knee today.  Pt reports that  pain got up to 7/10 last night.   PERTINENT HISTORY: Allergies, hypertension, diabetes, congestive heart failure, osteoarthritis, chronic low back pain, chronic kidney disease, anxiety, claustrophobia, and fibromyalgia PAIN:  Are you having pain? Yes: NPRS scale: 6/10 Pain location: left knee Pain description: constant aching, throbbing, and burning  Aggravating factors: standing, walking, and moving her knee  Relieving factors: ice and medication   PRECAUTIONS: None  RED FLAGS: None   WEIGHT BEARING RESTRICTIONS: No  FALLS:  Has patient fallen in last 6 months? No  LIVING ENVIRONMENT: Lives with: lives with their family Lives in: House/apartment Stairs: Yes: Internal: 14 steps; on right going up and External: 1 steps; none Has following equipment at home: Walker - 2 wheeled  OCCUPATION: retired  PLOF: Independent  PATIENT GOALS: be able to paint the last two rooms of her house, be able to do her yard work, reduced pain, and improved mobility  NEXT MD VISIT: 05/10/23  OBJECTIVE: all objective measures were assessed at her initial evaluation on 04/27/23 unless otherwise noted  PATIENT SURVEYS:  FOTO 12.20  COGNITION: Overall cognitive  status: Within functional limits for tasks assessed     SENSATION: Patient reports no numbness or tingling  EDEMA:  Circumferential: L tibiofemoral joint line: 53.3 cn R tibiofemoral joint line: 49.0 cm  POSTURE: forward head and flexed trunk   PALPATION: TTP: left quadriceps, hip adductors, hamstrings, medial and lateral tibiofemoral joint lines  LOWER EXTREMITY ROM:  Active ROM Right eval Left eval 05/09/23  Hip flexion     Hip extension     Hip abduction     Hip adduction     Hip internal rotation     Hip external rotation     Knee flexion 133 39/ 43 (PROM) 68 degrees(passive)  Knee extension 0 20 -14 degrees (passive).  Ankle dorsiflexion     Ankle plantarflexion     Ankle inversion     Ankle eversion      (Blank  rows = not tested)  LOWER EXTREMITY MMT: not assessed due to surgical condition  LOWER EXTREMITY SPECIAL TESTS:  Not tested due to surgical condition  GAIT: Assistive device utilized: Environmental consultant - 2 wheeled and Wheelchair (manual) Level of assistance: Modified independence and Total A Comments: Patient utilized a shuffling pattern with the FWW, but utilized for long distance mobility    TODAY'S TREATMENT:                                                                                                                              DATE:  05/15/23                                    EXERCISE LOG  Exercise Repetitions and Resistance Comments  Nustep  Lvl 3 x 15 mins; seat 9-7   Rockerboard 2 mins   Lunges    LAQs AROM 20 reps   Seated Marches AROM 20 reps   Seated Hip Abduction Red x 3 mins   Seated Hip Adduction 3 mins   Seated Heel Slides Knee Glide x 3 mins    Blank cell = exercise not performed today   Modalities  Date:  Unattended Estim: Knee, IFC 80-150 Hz, 15 mins, Pain Vaso: Knee, 34 degrees; low pressure, 15 mins, Pain  05/11/23:                                       EXERCISE LOG    LT TKR  Exercise Repetitions and Resistance Comments  Nustep Level 3 moving seat forward x 2 to increase knee flexion x 15 minutes.   2# SAQ's    LAQ's X 10 hold 5 secs   Standing Heel ups/ toe ups 3x10 each           In supine and seated:  Low load long duration stretching into flexion and extension x 10 minutes  elevation and vasopneumatic x 15 minutes LT knee                                   05/04/23 EXERCISE LOG  Exercise Repetitions and Resistance Comments  Nustep  L2 x 16 minutes; seat 9   Supine ankle pump  3 mins LLE only   Supine heel slide Heel Glide x 3 mins LLE only   Supine quad set  2 minutes w/ 5 second hold  LLE only   Supine ham set 2 mins w/ 5 sec hold   Supine SAQ    Seated Hip Abduction 3 mins w/ 3 sec hold   Seated Hip Adduction Red tband 3 mins w/ 3 sec  hold    Blank cell = exercise not performed today  Modalities: no redness or adverse reaction to today's modalities   Date:  Vaso: Knee, 34 degrees; low pressure, 15 mins, Pain and Edema   PATIENT EDUCATION:  Education details: HEP, plan of care, prognosis, healing, and goals for therapy Person educated: Patient Education method: Explanation Education comprehension: verbalized understanding  HOME EXERCISE PROGRAM: [id number]  ASSESSMENT:  CLINICAL IMPRESSION:  Pt arrives for today's treatment session reporting 6/10 left knee pain today with pain getting up to 7/10 last night.  Pt introduced to standing rockerboard today with pt requiring BUE support for safety.  Pt declined lunges today, but would benefit from performance of these at next session.  Pt also instructed to perform heel slides at home with wash rag on hard wood floor.  Normal responses to vaso and estim noted upon removal.  Pt reported 4/10 left knee pain at completion of today's treatment session.  OBJECTIVE IMPAIRMENTS: Abnormal gait, decreased activity tolerance, decreased mobility, difficulty walking, decreased ROM, decreased strength, hypomobility, increased edema, impaired tone, and pain.   ACTIVITY LIMITATIONS: carrying, lifting, sitting, standing, squatting, stairs, transfers, bed mobility, bathing, dressing, locomotion level, and caring for others  PARTICIPATION LIMITATIONS: meal prep, cleaning, laundry, driving, shopping, community activity, and yard work  PERSONAL FACTORS: Past/current experiences, Transportation, and 3+ comorbidities: Allergies, hypertension, diabetes, congestive heart failure, osteoarthritis, chronic low back pain, chronic kidney disease, anxiety, claustrophobia, and fibromyalgia  are also affecting patient's functional outcome.   REHAB POTENTIAL: Good  CLINICAL DECISION MAKING: Evolving/moderate complexity  EVALUATION COMPLEXITY: Moderate   GOALS: Goals reviewed with patient? Yes  SHORT  TERM GOALS: Target date: 05/18/23 Patient will be independent with her initial HEP. Baseline: Goal status: INITIAL  2.  Patient will be able to demonstrate active left knee extension within 10 degrees of neutral for improved knee mobility.  Baseline:  Goal status: INITIAL  3.  Patient will be able to demonstrate active left knee flexion to at least 90 degrees for improved function with transfers. Baseline:  Goal status: INITIAL  4.  Patient will be able to safely ambulate community distances with a walker with the least restrictive assistive device for improved community mobility. Baseline:  Goal status: INITIAL  LONG TERM GOALS: Target date: 06/08/23  Patient will be independent with her advanced HEP. Baseline:  Goal status: INITIAL  2.  Patient will be able to demonstrate active left knee extension within 5 degrees of neutral for improved gait mechanics. Baseline:  Goal status: INITIAL  3.  Patient will improve her active left knee flexion to at least 120 degrees for improved function navigating stairs. Baseline:  Goal status: INITIAL  4.  Patient will  be able to safely ambulate with minimal to no gait deviations for improved mobility. Baseline:  Goal status: INITIAL  5.  Patient will be able to transfer from sitting to standing with minimal to no difficulty for improved household mobility. Baseline:  Goal status: INITIAL  PLAN:  PT FREQUENCY: 2-3x/week  PT DURATION: 6 weeks  PLANNED INTERVENTIONS: Therapeutic exercises, Therapeutic activity, Neuromuscular re-education, Balance training, Gait training, Patient/Family education, Self Care, Joint mobilization, Stair training, Electrical stimulation, Cryotherapy, Moist heat, Vasopneumatic device, Manual therapy, and Re-evaluation  PLAN FOR NEXT SESSION: Nustep, review and update HEP as needed, manual therapy, gait training, and modalities as needed   Newman Pies, PTA 05/15/2023, 10:56 AM

## 2023-05-18 ENCOUNTER — Other Ambulatory Visit: Payer: Self-pay

## 2023-05-18 ENCOUNTER — Ambulatory Visit: Payer: Medicare Other

## 2023-05-18 DIAGNOSIS — I493 Ventricular premature depolarization: Secondary | ICD-10-CM

## 2023-05-18 DIAGNOSIS — M25562 Pain in left knee: Secondary | ICD-10-CM | POA: Diagnosis not present

## 2023-05-18 DIAGNOSIS — M25662 Stiffness of left knee, not elsewhere classified: Secondary | ICD-10-CM

## 2023-05-18 DIAGNOSIS — I428 Other cardiomyopathies: Secondary | ICD-10-CM

## 2023-05-18 DIAGNOSIS — R600 Localized edema: Secondary | ICD-10-CM

## 2023-05-18 DIAGNOSIS — I5020 Unspecified systolic (congestive) heart failure: Secondary | ICD-10-CM

## 2023-05-18 DIAGNOSIS — N1831 Chronic kidney disease, stage 3a: Secondary | ICD-10-CM

## 2023-05-18 DIAGNOSIS — I2510 Atherosclerotic heart disease of native coronary artery without angina pectoris: Secondary | ICD-10-CM

## 2023-05-18 DIAGNOSIS — E119 Type 2 diabetes mellitus without complications: Secondary | ICD-10-CM

## 2023-05-18 DIAGNOSIS — E1159 Type 2 diabetes mellitus with other circulatory complications: Secondary | ICD-10-CM

## 2023-05-18 MED ORDER — FUROSEMIDE 20 MG PO TABS: 20.0000 mg | ORAL_TABLET | ORAL | 11 refills | Status: DC

## 2023-05-18 MED ORDER — CARVEDILOL 3.125 MG PO TABS: 3.1250 mg | ORAL_TABLET | Freq: Two times a day (BID) | ORAL | 3 refills | Status: DC

## 2023-05-18 NOTE — Therapy (Signed)
OUTPATIENT PHYSICAL THERAPY LOWER EXTREMITY TREATMENT   Patient Name: [name] MRN: [id number] DOB:03/19/1946, 77 y.o., female Today's Date: 05/18/2023  END OF SESSION:  PT End of Session - 05/18/23 0933     Visit Number 7    Number of Visits 12    Date for PT Re-Evaluation 06/30/23    PT Start Time 0930    PT Stop Time 1030    PT Time Calculation (min) 60 min    Activity Tolerance Patient tolerated treatment well    Behavior During Therapy [facility] for tasks assessed/performed               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) 08/24/2020   Diabetes mellitus without complication (HCC)    takes Trulicity,Jardiance,and Metformin daily.Average fasting blood sugar runs around130   Dysrhythmia    PVCs, has MVP   Fibromyalgia    GERD (gastroesophageal reflux disease)    takes Omeprazole daily   Gout 07/19/2021   On allopurinol   History of bronchitis > 8 yrs ago   History of shingles    HTN (hypertension)    takes Amlodipine and Micardis daily   Hx of colonic polyps    benign   Internal and external hemorrhoids without complication    Joint pain    Mitral valve prolapse    Neuromuscular disorder (HCC)    restless leg syndrome   Nocturia    Pneumonia    PONV (postoperative nausea and vomiting)    when gets injections in joints gets hives.Betadine rash   Restless leg syndrome    takes Requip at bedtime   Seasonal allergies    takes Claritin daily as needed   Urinary frequency    Uterine fibroid    Past Surgical History:  Procedure Laterality Date   BUNIONECTOMY Bilateral    COLONOSCOPY  07/12/2004   diverticulosis, internal and external hemorrhoids   ETHMOIDECTOMY Bilateral 08/15/2022   Procedure: ETHMOIDECTOMY WITH TISSUE REMOVAL;  Surgeon:  Newman Pies, MD;  Location: [facility];  Service: ENT;  Laterality: Bilateral;   FINGER ARTHROSCOPY WITH CARPOMETACARPEL (CMC) ARTHROPLASTY Right 09/28/2015   Procedure: RIGHT THUMB TRAPEZIUM EXCISION WITH CARPOMETACARPEL (CMC) ARTHROPLASTY AND TENDON TRANSFER;  Surgeon: Bradly Bienenstock, MD;  Location: [facility];  Service: Orthopedics;  Laterality: Right;   FRONTAL SINUS EXPLORATION Bilateral 08/15/2022   Procedure: FRONTAL RECESS SINUS EXPLORATION WITH NAVIGATION;  Surgeon: Newman Pies, MD;  Location: [facility];  Service: ENT;  Laterality: Bilateral;   HEMORRHOID SURGERY     almost 40 yrs ago   HYSTEROSCOPY WITH D & C  08/23/2000   and resectoscopic myomectomy   LUMBAR DISC SURGERY  03/16/2005   LUMBAR EPIDURAL INJECTION     MAXILLARY ANTROSTOMY Bilateral 08/15/2022   Procedure: ENDOSCOPIC MAXILLARY ANTROSTOMY WITH TISSUE REMOVAL;  Surgeon: Newman Pies, MD;  Location: [facility];  Service: ENT;  Laterality: Bilateral;   PLANTAR FASCIA RELEASE Left 02/03/2010   and torn tendon   REVERSE SHOULDER ARTHROPLASTY Right 12/14/2020   Procedure: REVERSE SHOULDER ARTHROPLASTY;  Surgeon: Beverely Low, MD;  Location: [facility];  Service: Orthopedics;  Laterality: Right;   RIGHT/LEFT HEART CATH AND CORONARY ANGIOGRAPHY N/A 09/07/2020   Procedure: RIGHT/LEFT HEART CATH AND CORONARY ANGIOGRAPHY;  Surgeon: Yvonne Kendall, MD;  Location: [facility];  Service: Cardiovascular;  Laterality: N/A;   SINUS ENDO W/FUSION Bilateral 08/15/2022   Procedure: SPHENOIDECTOMY WITH TISSUE REMOVAL WITH NAVIGATION;  Surgeon: Newman Pies, MD;  Location: [facility];  Service: ENT;  Laterality: Bilateral;   TENDON TRANSFER Right 09/28/2015   Procedure: TENDON TRANSFER;  Surgeon: Bradly Bienenstock, MD;  Location: [facility];  Service: Orthopedics;  Laterality: Right;   TONSILLECTOMY     TOTAL KNEE ARTHROPLASTY Left 04/24/2023   Procedure: TOTAL KNEE ARTHROPLASTY;  Surgeon: Ollen Gross,  MD;  Location: [facility];  Service: Orthopedics;  Laterality: Left;   TUBAL LIGATION     TURBINATE REDUCTION Bilateral 08/15/2022   Procedure: TURBINATE REDUCTION;  Surgeon: Newman Pies, MD;  Location: [facility];  Service: ENT;  Laterality: Bilateral;   WRIST SURGERY     left, removal of cyst   Patient Active Problem List   Diagnosis Date Noted   Screening for osteoporosis 10/04/2022   Stage 3a chronic kidney disease (HCC) 10/04/2022   Vocal fold paresis, left 04/21/2022   Laryngospasms 04/21/2022   Muscle tension dysphonia 04/21/2022   Old tear of meniscus of left knee 09/28/2021   Combined hyperlipidemia associated with type 2 diabetes mellitus (HCC) 09/28/2021   Gout 07/19/2021   S/P shoulder replacement, right 12/14/2020   Rotator cuff tear arthropathy 10/22/2020   Nonischemic cardiomyopathy (HCC) 10/06/2020   Congestive heart failure with left ventricular systolic dysfunction (HCC) 08/24/2020   Primary osteoarthritis of left shoulder 07/29/2020   DDD (degenerative disc disease), cervical 04/30/2020   Osteoarthritis of left knee 11/05/2018   Chronic pansinusitis 10/04/2017   Chronic low back pain 09/05/2017   Degenerative lumbar disc 06/04/2015   S/P lumbar laminectomy 06/04/2015   Restless leg syndrome 05/13/2014   Type 2 diabetes mellitus with peripheral neuropathy (HCC) 04/09/2013   Spinal stenosis of lumbar region without neurogenic claudication 09/12/2012   GERD (gastroesophageal reflux disease) 05/25/2011   Hemorrhoids, internal, with bleeding 05/25/2011   Hypertension associated with diabetes (HCC) 11/08/2010   Chronic allergic rhinitis 08/19/2010   Mitral valve prolapse 10/10/2008   REFERRING PROVIDER: Eartha Inch, PA   REFERRING DIAG: Unilateral primary osteoarthritis, left knee   THERAPY DIAG:  Acute pain of left knee  Stiffness of left knee, not elsewhere classified  Localized edema  Rationale for Evaluation and Treatment:  Rehabilitation  ONSET DATE: 04/24/23  SUBJECTIVE:   SUBJECTIVE STATEMENT:  Patient reports she is moving better today, but her knee is hurting more. She tried taking her pain medication around 7:30 this morning, but she has not noticed a difference.   PERTINENT HISTORY: Allergies, hypertension, diabetes, congestive heart failure, osteoarthritis, chronic low back pain, chronic kidney disease, anxiety, claustrophobia, and fibromyalgia PAIN:  Are you having pain? Yes: NPRS scale: 7/10 Pain location: left knee Pain description: constant aching, throbbing, and burning  Aggravating factors: standing, walking, and moving her knee  Relieving factors: ice and medication   PRECAUTIONS: None  RED FLAGS: None   WEIGHT BEARING RESTRICTIONS: No  FALLS:  Has patient fallen in last 6 months? No  LIVING ENVIRONMENT: Lives with: lives with their family Lives in: House/apartment Stairs: Yes: Internal: 14 steps; on right going up and External: 1 steps; none Has following equipment at home: Walker - 2 wheeled  OCCUPATION: retired  PLOF: Independent  PATIENT GOALS: be able to paint the last two rooms of her house, be able to do her yard work, reduced pain, and improved  mobility  NEXT MD VISIT: 05/10/23  OBJECTIVE: all objective measures were assessed at her initial evaluation on 04/27/23 unless otherwise noted  PATIENT SURVEYS:  FOTO 52.52 on 05/18/23  COGNITION: Overall cognitive status: Within functional limits for tasks assessed     SENSATION: Patient reports no numbness or tingling  EDEMA:  Circumferential: L tibiofemoral joint line: 53.3 cn R tibiofemoral joint line: 49.0 cm  POSTURE: forward head and flexed trunk   PALPATION: TTP: left quadriceps, hip adductors, hamstrings, medial and lateral tibiofemoral joint lines  LOWER EXTREMITY ROM:  Active ROM Right eval Left eval 05/09/23  Hip flexion     Hip extension     Hip abduction     Hip adduction     Hip internal  rotation     Hip external rotation     Knee flexion 133 39/ 43 (PROM) 68 degrees(passive)  Knee extension 0 20 -14 degrees (passive).  Ankle dorsiflexion     Ankle plantarflexion     Ankle inversion     Ankle eversion      (Blank rows = not tested)  LOWER EXTREMITY MMT: not assessed due to surgical condition  LOWER EXTREMITY SPECIAL TESTS:  Not tested due to surgical condition  GAIT: Assistive device utilized: Environmental consultant - 2 wheeled and Wheelchair (manual) Level of assistance: Modified independence and Total A Comments: Patient utilized a shuffling pattern with the FWW, but utilized for long distance mobility    TODAY'S TREATMENT:                                                                                                                              DATE:                                   05/18/23  EXERCISE LOG  Exercise Repetitions and Resistance Comments  Nustep  L3 x 18 minutes; seat 10-9   Standing gastroc stretch  2 minutes    Marching on foam  3 minutes  BUE support  LAQ 2# x 2.5 minutes  LLE only   Seated marching  2# x 2 minutes  LLE only    Blank cell = exercise not performed today  Manual Therapy Soft Tissue Mobilization: left quadriceps, for reduced pain and improved soft tissue extensibility Joint Mobilizations: tibiofemoral and patellar, grade I-III   Modalities: no redness or adverse reaction to today's modalities  Date:  Unattended Estim: Knee, IFC @ 80-150 Hz w/ 40% scan, 15 mins, Pain Vaso: Knee, 34 degrees; low pressure, 15 mins, Pain  05/15/23   EXERCISE LOG  Exercise Repetitions and Resistance Comments  Nustep  Lvl 3 x 15 mins; seat 9-7   Rockerboard 2 mins   Lunges    LAQs AROM 20 reps   Seated Marches AROM 20 reps   Seated Hip Abduction Red x 3 mins   Seated Hip Adduction 3 mins  Seated Heel Slides Knee Glide x 3 mins    Blank cell = exercise not performed today   Modalities  Date:  Unattended Estim: Knee, IFC 80-150 Hz, 15 mins,  Pain Vaso: Knee, 34 degrees; low pressure, 15 mins, Pain  05/11/23:      EXERCISE LOG    LT TKR  Exercise Repetitions and Resistance Comments  Nustep Level 3 moving seat forward x 2 to increase knee flexion x 15 minutes.   2# SAQ's    LAQ's X 10 hold 5 secs   Standing Heel ups/ toe ups 3x10 each           In supine and seated:  Low load long duration stretching into flexion and extension x 10 minutes   elevation and vasopneumatic x 15 minutes LT knee  PATIENT EDUCATION:  Education details: HEP, plan of care, prognosis, healing, and goals for therapy Person educated: Patient Education method: Explanation Education comprehension: verbalized understanding  HOME EXERCISE PROGRAM: [id number]  ASSESSMENT:  CLINICAL IMPRESSION:  Patient was progressed with familiar interventions for improved lower extremity strength needed to facilitate improved left knee mobility. She required minimal cueing with today's interventions for proper exercise performance. Manual therapy focused on improved patellar and tibiofemoral joint mobilizations for improved knee mobility. She experienced no significant increase in pain or discomfort with any of today's interventions. She reported that her knee felt better upon the conclusion of treatment. She continues to require skilled physical therapy to address her remaining impairments to return to her prior level of function.    OBJECTIVE IMPAIRMENTS: Abnormal gait, decreased activity tolerance, decreased mobility, difficulty walking, decreased ROM, decreased strength, hypomobility, increased edema, impaired tone, and pain.   ACTIVITY LIMITATIONS: carrying, lifting, sitting, standing, squatting, stairs, transfers, bed mobility, bathing, dressing, locomotion level, and caring for others  PARTICIPATION LIMITATIONS: meal prep, cleaning, laundry, driving, shopping, community activity, and yard work  PERSONAL FACTORS: Past/current experiences, Transportation, and 3+  comorbidities: Allergies, hypertension, diabetes, congestive heart failure, osteoarthritis, chronic low back pain, chronic kidney disease, anxiety, claustrophobia, and fibromyalgia  are also affecting patient's functional outcome.   REHAB POTENTIAL: Good  CLINICAL DECISION MAKING: Evolving/moderate complexity  EVALUATION COMPLEXITY: Moderate   GOALS: Goals reviewed with patient? Yes  SHORT TERM GOALS: Target date: 05/18/23 Patient will be independent with her initial HEP. Baseline: Goal status: INITIAL  2.  Patient will be able to demonstrate active left knee extension within 10 degrees of neutral for improved knee mobility.  Baseline:  Goal status: INITIAL  3.  Patient will be able to demonstrate active left knee flexion to at least 90 degrees for improved function with transfers. Baseline:  Goal status: INITIAL  4.  Patient will be able to safely ambulate community distances with a walker with the least restrictive assistive device for improved community mobility. Baseline:  Goal status: INITIAL  LONG TERM GOALS: Target date: 06/08/23  Patient will be independent with her advanced HEP. Baseline:  Goal status: INITIAL  2.  Patient will be able to demonstrate active left knee extension within 5 degrees of neutral for improved gait mechanics. Baseline:  Goal status: INITIAL  3.  Patient will improve her active left knee flexion to at least 120 degrees for improved function navigating stairs. Baseline:  Goal status: INITIAL  4.  Patient will be able to safely ambulate with minimal to no gait deviations for improved mobility. Baseline:  Goal status: INITIAL  5.  Patient will be able to transfer from sitting to standing with minimal  to no difficulty for improved household mobility. Baseline:  Goal status: INITIAL  PLAN:  PT FREQUENCY: 2-3x/week  PT DURATION: 6 weeks  PLANNED INTERVENTIONS: Therapeutic exercises, Therapeutic activity, Neuromuscular re-education,  Balance training, Gait training, Patient/Family education, Self Care, Joint mobilization, Stair training, Electrical stimulation, Cryotherapy, Moist heat, Vasopneumatic device, Manual therapy, and Re-evaluation  PLAN FOR NEXT SESSION: Nustep, review and update HEP as needed, manual therapy, gait training, and modalities as needed   Granville Lewis, PT 05/18/2023, 1:07 PM

## 2023-05-22 ENCOUNTER — Ambulatory Visit: Payer: Medicare Other

## 2023-05-22 DIAGNOSIS — R600 Localized edema: Secondary | ICD-10-CM

## 2023-05-22 DIAGNOSIS — M25562 Pain in left knee: Secondary | ICD-10-CM

## 2023-05-22 DIAGNOSIS — M25662 Stiffness of left knee, not elsewhere classified: Secondary | ICD-10-CM

## 2023-05-22 NOTE — Therapy (Signed)
OUTPATIENT PHYSICAL THERAPY LOWER EXTREMITY TREATMENT   Patient Name: [name] MRN: [id number] DOB:April 13, 1946, 77 y.o., female Today's Date: 05/22/2023  END OF SESSION:  PT End of Session - 05/22/23 0928     Visit Number 8    Number of Visits 12    Date for PT Re-Evaluation 06/30/23    PT Start Time 0930    PT Stop Time 1030    PT Time Calculation (min) 60 min    Activity Tolerance Patient tolerated treatment well    Behavior During Therapy [facility] for tasks assessed/performed                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) 08/24/2020   Diabetes mellitus without complication (HCC)    takes Trulicity,Jardiance,and Metformin daily.Average fasting blood sugar runs around130   Dysrhythmia    PVCs, has MVP   Fibromyalgia    GERD (gastroesophageal reflux disease)    takes Omeprazole daily   Gout 07/19/2021   On allopurinol   History of bronchitis > 8 yrs ago   History of shingles    HTN (hypertension)    takes Amlodipine and Micardis daily   Hx of colonic polyps    benign   Internal and external hemorrhoids without complication    Joint pain    Mitral valve prolapse    Neuromuscular disorder (HCC)    restless leg syndrome   Nocturia    Pneumonia    PONV (postoperative nausea and vomiting)    when gets injections in joints gets hives.Betadine rash   Restless leg syndrome    takes Requip at bedtime   Seasonal allergies    takes Claritin daily as needed   Urinary frequency    Uterine fibroid    Past Surgical History:  Procedure Laterality Date   BUNIONECTOMY Bilateral    COLONOSCOPY  07/12/2004   diverticulosis, internal and external hemorrhoids   ETHMOIDECTOMY Bilateral 08/15/2022   Procedure: ETHMOIDECTOMY WITH TISSUE REMOVAL;  Surgeon:  Newman Pies, MD;  Location: [facility];  Service: ENT;  Laterality: Bilateral;   FINGER ARTHROSCOPY WITH CARPOMETACARPEL (CMC) ARTHROPLASTY Right 09/28/2015   Procedure: RIGHT THUMB TRAPEZIUM EXCISION WITH CARPOMETACARPEL (CMC) ARTHROPLASTY AND TENDON TRANSFER;  Surgeon: Bradly Bienenstock, MD;  Location: [facility];  Service: Orthopedics;  Laterality: Right;   FRONTAL SINUS EXPLORATION Bilateral 08/15/2022   Procedure: FRONTAL RECESS SINUS EXPLORATION WITH NAVIGATION;  Surgeon: Newman Pies, MD;  Location: [facility];  Service: ENT;  Laterality: Bilateral;   HEMORRHOID SURGERY     almost 40 yrs ago   HYSTEROSCOPY WITH D & C  08/23/2000   and resectoscopic myomectomy   LUMBAR DISC SURGERY  03/16/2005   LUMBAR EPIDURAL INJECTION     MAXILLARY ANTROSTOMY Bilateral 08/15/2022   Procedure: ENDOSCOPIC MAXILLARY ANTROSTOMY WITH TISSUE REMOVAL;  Surgeon: Newman Pies, MD;  Location: [facility];  Service: ENT;  Laterality: Bilateral;   PLANTAR FASCIA RELEASE Left 02/03/2010   and torn tendon   REVERSE SHOULDER ARTHROPLASTY Right 12/14/2020   Procedure: REVERSE SHOULDER ARTHROPLASTY;  Surgeon: Beverely Low, MD;  Location: [facility];  Service: Orthopedics;  Laterality: Right;   RIGHT/LEFT HEART CATH AND CORONARY ANGIOGRAPHY N/A 09/07/2020   Procedure: RIGHT/LEFT HEART CATH AND CORONARY  ANGIOGRAPHY;  Surgeon: Yvonne Kendall, MD;  Location: [facility];  Service: Cardiovascular;  Laterality: N/A;   SINUS ENDO W/FUSION Bilateral 08/15/2022   Procedure: SPHENOIDECTOMY WITH TISSUE REMOVAL WITH NAVIGATION;  Surgeon: Newman Pies, MD;  Location: [facility];  Service: ENT;  Laterality: Bilateral;   TENDON TRANSFER Right 09/28/2015   Procedure: TENDON TRANSFER;  Surgeon: Bradly Bienenstock, MD;  Location: [facility];  Service: Orthopedics;  Laterality: Right;   TONSILLECTOMY     TOTAL KNEE ARTHROPLASTY Left 04/24/2023   Procedure: TOTAL KNEE ARTHROPLASTY;  Surgeon: Ollen Gross,  MD;  Location: [facility];  Service: Orthopedics;  Laterality: Left;   TUBAL LIGATION     TURBINATE REDUCTION Bilateral 08/15/2022   Procedure: TURBINATE REDUCTION;  Surgeon: Newman Pies, MD;  Location: [facility];  Service: ENT;  Laterality: Bilateral;   WRIST SURGERY     left, removal of cyst   Patient Active Problem List   Diagnosis Date Noted   Screening for osteoporosis 10/04/2022   Stage 3a chronic kidney disease (HCC) 10/04/2022   Vocal fold paresis, left 04/21/2022   Laryngospasms 04/21/2022   Muscle tension dysphonia 04/21/2022   Old tear of meniscus of left knee 09/28/2021   Combined hyperlipidemia associated with type 2 diabetes mellitus (HCC) 09/28/2021   Gout 07/19/2021   S/P shoulder replacement, right 12/14/2020   Rotator cuff tear arthropathy 10/22/2020   Nonischemic cardiomyopathy (HCC) 10/06/2020   Congestive heart failure with left ventricular systolic dysfunction (HCC) 08/24/2020   Primary osteoarthritis of left shoulder 07/29/2020   DDD (degenerative disc disease), cervical 04/30/2020   Osteoarthritis of left knee 11/05/2018   Chronic pansinusitis 10/04/2017   Chronic low back pain 09/05/2017   Degenerative lumbar disc 06/04/2015   S/P lumbar laminectomy 06/04/2015   Restless leg syndrome 05/13/2014   Type 2 diabetes mellitus with peripheral neuropathy (HCC) 04/09/2013   Spinal stenosis of lumbar region without neurogenic claudication 09/12/2012   GERD (gastroesophageal reflux disease) 05/25/2011   Hemorrhoids, internal, with bleeding 05/25/2011   Hypertension associated with diabetes (HCC) 11/08/2010   Chronic allergic rhinitis 08/19/2010   Mitral valve prolapse 10/10/2008   REFERRING PROVIDER: Eartha Inch, PA   REFERRING DIAG: Unilateral primary osteoarthritis, left knee   THERAPY DIAG:  Acute pain of left knee  Stiffness of left knee, not elsewhere classified  Localized edema  Rationale for Evaluation and Treatment:  Rehabilitation  ONSET DATE: 04/24/23  SUBJECTIVE:   SUBJECTIVE STATEMENT:  Patient reports that he knee is mainly stiff today. She has not had any problems since her last appointment.   PERTINENT HISTORY: Allergies, hypertension, diabetes, congestive heart failure, osteoarthritis, chronic low back pain, chronic kidney disease, anxiety, claustrophobia, and fibromyalgia PAIN:  Are you having pain? Yes: NPRS scale: 6/10 Pain location: left knee Pain description: constant aching, throbbing, and burning  Aggravating factors: standing, walking, and moving her knee  Relieving factors: ice and medication   PRECAUTIONS: None  RED FLAGS: None   WEIGHT BEARING RESTRICTIONS: No  FALLS:  Has patient fallen in last 6 months? No  LIVING ENVIRONMENT: Lives with: lives with their family Lives in: House/apartment Stairs: Yes: Internal: 14 steps; on right going up and External: 1 steps; none Has following equipment at home: Dan Humphreys - 2 wheeled  OCCUPATION: retired  PLOF: Independent  PATIENT GOALS: be able to paint the last two rooms of her house, be able to do her yard work, reduced pain, and improved mobility  NEXT MD VISIT: 05/10/23  OBJECTIVE: all  objective measures were assessed at her initial evaluation on 04/27/23 unless otherwise noted  PATIENT SURVEYS:  FOTO 52.52 on 05/18/23  COGNITION: Overall cognitive status: Within functional limits for tasks assessed     SENSATION: Patient reports no numbness or tingling  EDEMA:  Circumferential: L tibiofemoral joint line: 53.3 cn R tibiofemoral joint line: 49.0 cm  POSTURE: forward head and flexed trunk   PALPATION: TTP: left quadriceps, hip adductors, hamstrings, medial and lateral tibiofemoral joint lines  LOWER EXTREMITY ROM:  Active ROM Right eval Left eval 05/09/23  Hip flexion     Hip extension     Hip abduction     Hip adduction     Hip internal rotation     Hip external rotation     Knee flexion 133 39/ 43  (PROM) 68 degrees(passive)  Knee extension 0 20 -14 degrees (passive).  Ankle dorsiflexion     Ankle plantarflexion     Ankle inversion     Ankle eversion      (Blank rows = not tested)  LOWER EXTREMITY MMT: not assessed due to surgical condition  LOWER EXTREMITY SPECIAL TESTS:  Not tested due to surgical condition  GAIT: Assistive device utilized: Environmental consultant - 2 wheeled and Wheelchair (manual) Level of assistance: Modified independence and Total A Comments: Patient utilized a shuffling pattern with the FWW, but utilized for long distance mobility    TODAY'S TREATMENT:                                                                                                                              DATE:                                    05/22/23 EXERCISE LOG  Exercise Repetitions and Resistance Comments  NuStep  L4 x 17 minutes; seat 8-7   LAQ 2# x 2 minutes  LLE only   Seated heel slide  2 minutes  LLE only   Rocker board  5 minutes  BUE support        Blank cell = exercise not performed today  Manual Therapy Soft Tissue Mobilization: left quadriceps, hamstring, and scar mobilization, for improved soft tissue extensibility Joint Mobilizations: patellar and tibiofemoral, grade I-III Passive ROM: flexion and extension, with overpressure to tolerance   Modalities  Date:  Unattended Estim: Knee, IFC @ 80-150 Hz w/ 40% scan, 15 mins, Pain and Edema Vaso: Knee, 34 degrees; low pressure, 15 mins, Pain and Edema                                  05/18/23  EXERCISE LOG  Exercise Repetitions and Resistance Comments  Nustep  L3 x 18 minutes; seat 10-9   Standing gastroc stretch  2 minutes    Marching on foam  3 minutes  BUE support  LAQ 2# x 2.5 minutes  LLE only   Seated marching  2# x 2 minutes  LLE only    Blank cell = exercise not performed today  Manual Therapy Soft Tissue Mobilization: left quadriceps, for reduced pain and improved soft tissue extensibility Joint Mobilizations:  tibiofemoral and patellar, grade I-III   Modalities: no redness or adverse reaction to today's modalities  Date:  Unattended Estim: Knee, IFC @ 80-150 Hz w/ 40% scan, 15 mins, Pain Vaso: Knee, 34 degrees; low pressure, 15 mins, Pain  05/15/23   EXERCISE LOG  Exercise Repetitions and Resistance Comments  Nustep  Lvl 3 x 15 mins; seat 9-7   Rockerboard 2 mins   Lunges    LAQs AROM 20 reps   Seated Marches AROM 20 reps   Seated Hip Abduction Red x 3 mins   Seated Hip Adduction 3 mins   Seated Heel Slides Knee Glide x 3 mins    Blank cell = exercise not performed today   Modalities  Date:  Unattended Estim: Knee, IFC 80-150 Hz, 15 mins, Pain Vaso: Knee, 34 degrees; low pressure, 15 mins, Pain  PATIENT EDUCATION:  Education details: HEP, plan of care, prognosis, healing, and goals for therapy Person educated: Patient Education method: Explanation Education comprehension: verbalized understanding  HOME EXERCISE PROGRAM: [id number]  ASSESSMENT:  CLINICAL IMPRESSION:  Treatment focused on familiar interventions for improved knee mobility. She required moderate cueing with today's interventions for proper pacing to facilitate a full arc of motion Manual therapy was utilized to promote improved knee mobility. Scar mobilization and soft tissue mobilization to her left quadriceps with maximal knee flexion for improved knee mobility. However, pain was her primary limiting factor during passive knee flexion. She reported that her knee felt "a little better" upon the conclusion of treatment. She continues to require skilled physical therapy to address her remaining impairments to return to her prior level of function.   OBJECTIVE IMPAIRMENTS: Abnormal gait, decreased activity tolerance, decreased mobility, difficulty walking, decreased ROM, decreased strength, hypomobility, increased edema, impaired tone, and pain.   ACTIVITY LIMITATIONS: carrying, lifting, sitting, standing, squatting,  stairs, transfers, bed mobility, bathing, dressing, locomotion level, and caring for others  PARTICIPATION LIMITATIONS: meal prep, cleaning, laundry, driving, shopping, community activity, and yard work  PERSONAL FACTORS: Past/current experiences, Transportation, and 3+ comorbidities: Allergies, hypertension, diabetes, congestive heart failure, osteoarthritis, chronic low back pain, chronic kidney disease, anxiety, claustrophobia, and fibromyalgia  are also affecting patient's functional outcome.   REHAB POTENTIAL: Good  CLINICAL DECISION MAKING: Evolving/moderate complexity  EVALUATION COMPLEXITY: Moderate   GOALS: Goals reviewed with patient? Yes  SHORT TERM GOALS: Target date: 05/18/23 Patient will be independent with her initial HEP. Baseline: Goal status: INITIAL  2.  Patient will be able to demonstrate active left knee extension within 10 degrees of neutral for improved knee mobility.  Baseline:  Goal status: INITIAL  3.  Patient will be able to demonstrate active left knee flexion to at least 90 degrees for improved function with transfers. Baseline:  Goal status: INITIAL  4.  Patient will be able to safely ambulate community distances with a walker with the least restrictive assistive device for improved community mobility. Baseline:  Goal status: INITIAL  LONG TERM GOALS: Target date: 06/08/23  Patient will be independent with her advanced HEP. Baseline:  Goal status: INITIAL  2.  Patient will be able to demonstrate active left knee extension within 5 degrees of neutral for improved gait mechanics. Baseline:  Goal  status: INITIAL  3.  Patient will improve her active left knee flexion to at least 120 degrees for improved function navigating stairs. Baseline:  Goal status: INITIAL  4.  Patient will be able to safely ambulate with minimal to no gait deviations for improved mobility. Baseline:  Goal status: INITIAL  5.  Patient will be able to transfer from  sitting to standing with minimal to no difficulty for improved household mobility. Baseline:  Goal status: INITIAL  PLAN:  PT FREQUENCY: 2-3x/week  PT DURATION: 6 weeks  PLANNED INTERVENTIONS: Therapeutic exercises, Therapeutic activity, Neuromuscular re-education, Balance training, Gait training, Patient/Family education, Self Care, Joint mobilization, Stair training, Electrical stimulation, Cryotherapy, Moist heat, Vasopneumatic device, Manual therapy, and Re-evaluation  PLAN FOR NEXT SESSION: Nustep, review and update HEP as needed, manual therapy, gait training, and modalities as needed   Granville Lewis, PT 05/22/2023, 12:38 PM

## 2023-05-24 ENCOUNTER — Ambulatory Visit (INDEPENDENT_AMBULATORY_CARE_PROVIDER_SITE_OTHER): Payer: Medicare Other

## 2023-05-24 DIAGNOSIS — Z23 Encounter for immunization: Secondary | ICD-10-CM

## 2023-05-25 ENCOUNTER — Ambulatory Visit: Payer: Medicare Other

## 2023-05-25 DIAGNOSIS — R600 Localized edema: Secondary | ICD-10-CM

## 2023-05-25 DIAGNOSIS — M25562 Pain in left knee: Secondary | ICD-10-CM

## 2023-05-25 DIAGNOSIS — M25662 Stiffness of left knee, not elsewhere classified: Secondary | ICD-10-CM

## 2023-05-25 NOTE — Therapy (Signed)
OUTPATIENT PHYSICAL THERAPY LOWER EXTREMITY TREATMENT   Patient Name: [name] MRN: [id number] DOB:15-Sep-1945, 77 y.o., female Today's Date: 05/25/2023  END OF SESSION:  PT End of Session - 05/25/23 0937     Visit Number 9    Number of Visits 12    Date for PT Re-Evaluation 06/30/23    PT Start Time 0930    PT Stop Time 1025    PT Time Calculation (min) 55 min    Activity Tolerance Patient tolerated treatment well    Behavior During Therapy [facility] for tasks assessed/performed                 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) 08/24/2020   Diabetes mellitus without complication (HCC)    takes Trulicity,Jardiance,and Metformin daily.Average fasting blood sugar runs around130   Dysrhythmia    PVCs, has MVP   Fibromyalgia    GERD (gastroesophageal reflux disease)    takes Omeprazole daily   Gout 07/19/2021   On allopurinol   History of bronchitis > 8 yrs ago   History of shingles    HTN (hypertension)    takes Amlodipine and Micardis daily   Hx of colonic polyps    benign   Internal and external hemorrhoids without complication    Joint pain    Mitral valve prolapse    Neuromuscular disorder (HCC)    restless leg syndrome   Nocturia    Pneumonia    PONV (postoperative nausea and vomiting)    when gets injections in joints gets hives.Betadine rash   Restless leg syndrome    takes Requip at bedtime   Seasonal allergies    takes Claritin daily as needed   Urinary frequency    Uterine fibroid    Past Surgical History:  Procedure Laterality Date   BUNIONECTOMY Bilateral    COLONOSCOPY  07/12/2004   diverticulosis, internal and external hemorrhoids   ETHMOIDECTOMY Bilateral 08/15/2022   Procedure: ETHMOIDECTOMY WITH TISSUE REMOVAL;   Surgeon: Newman Pies, MD;  Location: [facility];  Service: ENT;  Laterality: Bilateral;   FINGER ARTHROSCOPY WITH CARPOMETACARPEL (CMC) ARTHROPLASTY Right 09/28/2015   Procedure: RIGHT THUMB TRAPEZIUM EXCISION WITH CARPOMETACARPEL (CMC) ARTHROPLASTY AND TENDON TRANSFER;  Surgeon: Bradly Bienenstock, MD;  Location: [facility];  Service: Orthopedics;  Laterality: Right;   FRONTAL SINUS EXPLORATION Bilateral 08/15/2022   Procedure: FRONTAL RECESS SINUS EXPLORATION WITH NAVIGATION;  Surgeon: Newman Pies, MD;  Location: [facility];  Service: ENT;  Laterality: Bilateral;   HEMORRHOID SURGERY     almost 40 yrs ago   HYSTEROSCOPY WITH D & C  08/23/2000   and resectoscopic myomectomy   LUMBAR DISC SURGERY  03/16/2005   LUMBAR EPIDURAL INJECTION     MAXILLARY ANTROSTOMY Bilateral 08/15/2022   Procedure: ENDOSCOPIC MAXILLARY ANTROSTOMY WITH TISSUE REMOVAL;  Surgeon: Newman Pies, MD;  Location: [facility];  Service: ENT;  Laterality: Bilateral;   PLANTAR FASCIA RELEASE Left 02/03/2010   and torn tendon   REVERSE SHOULDER ARTHROPLASTY Right 12/14/2020   Procedure: REVERSE SHOULDER ARTHROPLASTY;  Surgeon: Beverely Low, MD;  Location: [facility];  Service: Orthopedics;  Laterality: Right;   RIGHT/LEFT HEART CATH AND CORONARY ANGIOGRAPHY N/A 09/07/2020   Procedure: RIGHT/LEFT HEART CATH AND  CORONARY ANGIOGRAPHY;  Surgeon: Yvonne Kendall, MD;  Location: [facility];  Service: Cardiovascular;  Laterality: N/A;   SINUS ENDO W/FUSION Bilateral 08/15/2022   Procedure: SPHENOIDECTOMY WITH TISSUE REMOVAL WITH NAVIGATION;  Surgeon: Newman Pies, MD;  Location: [facility];  Service: ENT;  Laterality: Bilateral;   TENDON TRANSFER Right 09/28/2015   Procedure: TENDON TRANSFER;  Surgeon: Bradly Bienenstock, MD;  Location: [facility];  Service: Orthopedics;  Laterality: Right;   TONSILLECTOMY     TOTAL KNEE ARTHROPLASTY Left 04/24/2023   Procedure: TOTAL KNEE ARTHROPLASTY;  Surgeon:  Ollen Gross, MD;  Location: [facility];  Service: Orthopedics;  Laterality: Left;   TUBAL LIGATION     TURBINATE REDUCTION Bilateral 08/15/2022   Procedure: TURBINATE REDUCTION;  Surgeon: Newman Pies, MD;  Location:  SURGERY CENTER;  Service: ENT;  Laterality: Bilateral;   WRIST SURGERY     left, removal of cyst   Patient Active Problem List   Diagnosis Date Noted   Screening for osteoporosis 10/04/2022   Stage 3a chronic kidney disease (HCC) 10/04/2022   Vocal fold paresis, left 04/21/2022   Laryngospasms 04/21/2022   Muscle tension dysphonia 04/21/2022   Old tear of meniscus of left knee 09/28/2021   Combined hyperlipidemia associated with type 2 diabetes mellitus (HCC) 09/28/2021   Gout 07/19/2021   S/P shoulder replacement, right 12/14/2020   Rotator cuff tear arthropathy 10/22/2020   Nonischemic cardiomyopathy (HCC) 10/06/2020   Congestive heart failure with left ventricular systolic dysfunction (HCC) 08/24/2020   Primary osteoarthritis of left shoulder 07/29/2020   DDD (degenerative disc disease), cervical 04/30/2020   Osteoarthritis of left knee 11/05/2018   Chronic pansinusitis 10/04/2017   Chronic low back pain 09/05/2017   Degenerative lumbar disc 06/04/2015   S/P lumbar laminectomy 06/04/2015   Restless leg syndrome 05/13/2014   Type 2 diabetes mellitus with peripheral neuropathy (HCC) 04/09/2013   Spinal stenosis of lumbar region without neurogenic claudication 09/12/2012   GERD (gastroesophageal reflux disease) 05/25/2011   Hemorrhoids, internal, with bleeding 05/25/2011   Hypertension associated with diabetes (HCC) 11/08/2010   Chronic allergic rhinitis 08/19/2010   Mitral valve prolapse 10/10/2008   REFERRING PROVIDER: Eartha Inch, PA   REFERRING DIAG: Unilateral primary osteoarthritis, left knee   THERAPY DIAG:  Acute pain of left knee  Stiffness of left knee, not elsewhere classified  Localized edema  Rationale for Evaluation and Treatment:  Rehabilitation  ONSET DATE: 04/24/23  SUBJECTIVE:   SUBJECTIVE STATEMENT:  Patient reports that her knee feels tight today.   PERTINENT HISTORY: Allergies, hypertension, diabetes, congestive heart failure, osteoarthritis, chronic low back pain, chronic kidney disease, anxiety, claustrophobia, and fibromyalgia PAIN:  Are you having pain? Yes: NPRS scale: 6/10 Pain location: left knee Pain description: constant aching, throbbing, and burning  Aggravating factors: standing, walking, and moving her knee  Relieving factors: ice and medication   PRECAUTIONS: None  RED FLAGS: None   WEIGHT BEARING RESTRICTIONS: No  FALLS:  Has patient fallen in last 6 months? No  LIVING ENVIRONMENT: Lives with: lives with their family Lives in: House/apartment Stairs: Yes: Internal: 14 steps; on right going up and External: 1 steps; none Has following equipment at home: Walker - 2 wheeled  OCCUPATION: retired  PLOF: Independent  PATIENT GOALS: be able to paint the last two rooms of her house, be able to do her yard work, reduced pain, and improved mobility  NEXT MD VISIT: 05/10/23  OBJECTIVE: all objective measures were assessed at her initial evaluation on 04/27/23  unless otherwise noted  PATIENT SURVEYS:  FOTO 52.52 on 05/18/23  COGNITION: Overall cognitive status: Within functional limits for tasks assessed     SENSATION: Patient reports no numbness or tingling  EDEMA:  Circumferential: L tibiofemoral joint line: 53.3 cn R tibiofemoral joint line: 49.0 cm  POSTURE: forward head and flexed trunk   PALPATION: TTP: left quadriceps, hip adductors, hamstrings, medial and lateral tibiofemoral joint lines  LOWER EXTREMITY ROM:  Active ROM Right eval Left eval 05/09/23  Hip flexion     Hip extension     Hip abduction     Hip adduction     Hip internal rotation     Hip external rotation     Knee flexion 133 39/ 43 (PROM) 68 degrees(passive)  Knee extension 0 20 -14 degrees  (passive).  Ankle dorsiflexion     Ankle plantarflexion     Ankle inversion     Ankle eversion      (Blank rows = not tested)  LOWER EXTREMITY MMT: not assessed due to surgical condition  LOWER EXTREMITY SPECIAL TESTS:  Not tested due to surgical condition  GAIT: Assistive device utilized: Environmental consultant - 2 wheeled and Wheelchair (manual) Level of assistance: Modified independence and Total A Comments: Patient utilized a shuffling pattern with the FWW, but utilized for long distance mobility    TODAY'S TREATMENT:                                                                                                                              DATE:                                    05/25/23 EXERCISE LOG  Exercise Repetitions and Resistance Comments  Nustep L4 x 18.5 minutes; seat 8-7   Rocker board  5 minutes    LAQ 3# x 25 reps each  Alternating LE   Seated marching  3# x 25 reps each  Alternating LE  Lunges onto step  8" step x 3 minutes  LLE on step    Blank cell = exercise not performed today  Modalities: no redness or adverse reaction to today's modalities  Date:  Vaso: Knee, 34 degrees; low pressure, 15 mins, Pain                                   05/22/23 EXERCISE LOG  Exercise Repetitions and Resistance Comments  NuStep  L4 x 17 minutes; seat 8-7   LAQ 2# x 2 minutes  LLE only   Seated heel slide  2 minutes  LLE only   Rocker board  5 minutes  BUE support        Blank cell = exercise not performed today  Manual Therapy Soft Tissue Mobilization: left quadriceps, hamstring, and scar mobilization, for improved soft  tissue extensibility Joint Mobilizations: patellar and tibiofemoral, grade I-III Passive ROM: flexion and extension, with overpressure to tolerance   Modalities  Date:  Unattended Estim: Knee, IFC @ 80-150 Hz w/ 40% scan, 15 mins, Pain and Edema Vaso: Knee, 34 degrees; low pressure, 15 mins, Pain and Edema                                  05/18/23  EXERCISE  LOG  Exercise Repetitions and Resistance Comments  Nustep  L3 x 18 minutes; seat 10-9   Standing gastroc stretch  2 minutes    Marching on foam  3 minutes  BUE support  LAQ 2# x 2.5 minutes  LLE only   Seated marching  2# x 2 minutes  LLE only    Blank cell = exercise not performed today  Manual Therapy Soft Tissue Mobilization: left quadriceps, for reduced pain and improved soft tissue extensibility Joint Mobilizations: tibiofemoral and patellar, grade I-III   Modalities: no redness or adverse reaction to today's modalities  Date:  Unattended Estim: Knee, IFC @ 80-150 Hz w/ 40% scan, 15 mins, Pain Vaso: Knee, 34 degrees; low pressure, 15 mins, Pain  PATIENT EDUCATION:  Education details: HEP, plan of care, prognosis, healing, and goals for therapy Person educated: Patient Education method: Explanation Education comprehension: verbalized understanding  HOME EXERCISE PROGRAM: [id number]  ASSESSMENT:  CLINICAL IMPRESSION:  Today's interventions focused on interventions for improved knee mobility with moderate difficulty. She required minimal cueing with today's interventions for proper exercise performance. Her range of motion continues to be limited with her familiar symptoms and left knee stiffness being her primary limiting factors. She reported that her knee felt better upon the conclusion of treatment. She continues to require skilled physical therapy to address her remaining impairments to maximize her functional mobility.   OBJECTIVE IMPAIRMENTS: Abnormal gait, decreased activity tolerance, decreased mobility, difficulty walking, decreased ROM, decreased strength, hypomobility, increased edema, impaired tone, and pain.   ACTIVITY LIMITATIONS: carrying, lifting, sitting, standing, squatting, stairs, transfers, bed mobility, bathing, dressing, locomotion level, and caring for others  PARTICIPATION LIMITATIONS: meal prep, cleaning, laundry, driving, shopping, community activity,  and yard work  PERSONAL FACTORS: Past/current experiences, Transportation, and 3+ comorbidities: Allergies, hypertension, diabetes, congestive heart failure, osteoarthritis, chronic low back pain, chronic kidney disease, anxiety, claustrophobia, and fibromyalgia  are also affecting patient's functional outcome.   REHAB POTENTIAL: Good  CLINICAL DECISION MAKING: Evolving/moderate complexity  EVALUATION COMPLEXITY: Moderate   GOALS: Goals reviewed with patient? Yes  SHORT TERM GOALS: Target date: 05/18/23 Patient will be independent with her initial HEP. Baseline: Goal status: INITIAL  2.  Patient will be able to demonstrate active left knee extension within 10 degrees of neutral for improved knee mobility.  Baseline:  Goal status: INITIAL  3.  Patient will be able to demonstrate active left knee flexion to at least 90 degrees for improved function with transfers. Baseline:  Goal status: INITIAL  4.  Patient will be able to safely ambulate community distances with a walker with the least restrictive assistive device for improved community mobility. Baseline: utilizes a cane for mobility Goal status: MET  LONG TERM GOALS: Target date: 06/08/23  Patient will be independent with her advanced HEP. Baseline:  Goal status: INITIAL  2.  Patient will be able to demonstrate active left knee extension within 5 degrees of neutral for improved gait mechanics. Baseline:  Goal status: INITIAL  3.  Patient will improve her active left knee flexion to at least 120 degrees for improved function navigating stairs. Baseline:  Goal status: INITIAL  4.  Patient will be able to safely ambulate with minimal to no gait deviations for improved mobility. Baseline:  Goal status: INITIAL  5.  Patient will be able to transfer from sitting to standing with minimal to no difficulty for improved household mobility. Baseline:  Goal status: MET  PLAN:  PT FREQUENCY: 2-3x/week  PT DURATION: 6  weeks  PLANNED INTERVENTIONS: Therapeutic exercises, Therapeutic activity, Neuromuscular re-education, Balance training, Gait training, Patient/Family education, Self Care, Joint mobilization, Stair training, Electrical stimulation, Cryotherapy, Moist heat, Vasopneumatic device, Manual therapy, and Re-evaluation  PLAN FOR NEXT SESSION: Nustep, review and update HEP as needed, manual therapy, gait training, and modalities as needed   Granville Lewis, PT 05/25/2023, 11:05 AM

## 2023-05-26 ENCOUNTER — Encounter: Payer: Self-pay | Admitting: Family Medicine

## 2023-05-26 ENCOUNTER — Ambulatory Visit (INDEPENDENT_AMBULATORY_CARE_PROVIDER_SITE_OTHER): Payer: Medicare Other | Admitting: Family Medicine

## 2023-05-26 ENCOUNTER — Telehealth: Payer: Self-pay | Admitting: Internal Medicine

## 2023-05-26 VITALS — BP 138/80 | HR 74 | Temp 97.9°F | Ht 64.0 in | Wt 190.6 lb

## 2023-05-26 DIAGNOSIS — L0231 Cutaneous abscess of buttock: Secondary | ICD-10-CM | POA: Diagnosis not present

## 2023-05-26 DIAGNOSIS — Z794 Long term (current) use of insulin: Secondary | ICD-10-CM

## 2023-05-26 DIAGNOSIS — E1142 Type 2 diabetes mellitus with diabetic polyneuropathy: Secondary | ICD-10-CM

## 2023-05-26 DIAGNOSIS — Z96659 Presence of unspecified artificial knee joint: Secondary | ICD-10-CM | POA: Insufficient documentation

## 2023-05-26 DIAGNOSIS — Z96652 Presence of left artificial knee joint: Secondary | ICD-10-CM

## 2023-05-26 MED ORDER — SULFAMETHOXAZOLE-TRIMETHOPRIM 800-160 MG PO TABS: 1.0000 | ORAL_TABLET | Freq: Two times a day (BID) | ORAL | 0 refills | Status: DC

## 2023-05-26 MED ORDER — LANTUS SOLOSTAR 100 UNIT/ML ~~LOC~~ SOPN: 20.0000 [IU] | PEN_INJECTOR | Freq: Every day | SUBCUTANEOUS | Status: DC

## 2023-05-26 NOTE — Telephone Encounter (Signed)
Paper Work Dropped Off: [employer] patient assistance application  Date: 10.25.24  Location of paper:  Purple folder at front desk labeled "[employer]

## 2023-05-26 NOTE — Patient Instructions (Signed)
Please follow up as scheduled for your next visit with me: 07/07/2023   If you have any questions or concerns, please don't hesitate to send me a message via MyChart or call the office at [phone]. Thank you for visiting with [place] today! It's our pleasure caring for you.

## 2023-05-26 NOTE — Telephone Encounter (Signed)
Pt's [employer] patient assistance application was scanned to [employer], [facility] email. FYI

## 2023-05-26 NOTE — Progress Notes (Signed)
Subjective  CC:  Chief Complaint  Patient presents with   Diabetes   Cyst    Pt states that she has a cyst on the top of her buttocks since Tuesday, no pain, and no leakage, only red.     HPI: [name] is a 77 y.o. female who presents to the office today for follow up of diabetes and problems listed above in the chief complaint.  Diabetes follow up: Diabetes was well-controlled, however she had knee replacement surgery in September.  A1c at that time went up to 7.7.  Unclear reasons.  Patient diet is unchanged.  However, has had some hypoglycemia in the morning, the worst was in the 50s and she felt that.  Typical fastings are running between 70s and 90s.  She does not check after meals.  She typically eats dinner around 5.  She does not eat after that.  She is on metformin 500 twice daily, Farxiga 10 daily and Lantus 20 units nightly Status post knee arthroplasty left, still going to physical therapy.  Decreased range of motion and having some pain.  I reviewed notes.  Surgery was uncomplicated Here today because she has noticed a tender nodule on the inside of her right buttocks cheek.  Noticed earlier this week.  Mild soreness but no real pain.  However she does remain on pain medicines for her knee pain.  No fevers or systemic symptoms.  No drainage.  No injury.  Wt Readings from Last 3 Encounters:  05/26/23 190 lb 9.6 oz (86.5 kg)  05/11/23 191 lb (86.6 kg)  05/09/23 193 lb (87.5 kg)    BP Readings from Last 3 Encounters:  05/26/23 138/80  05/11/23 (!) 142/58  04/25/23 133/63    Assessment  1. Abscess of right buttock   2. Type 2 diabetes mellitus with peripheral neuropathy (HCC)   3. History of total left knee replacement      Plan  Firm abscess buttock right: Start warm compresses, Septra DS twice daily x 7 days and follow-up if not proving.  Discussed care plan Diabetes is currently marginally controlled.  Possibly due to rebound hyperglycemia or due to diet.   Will change Lantus to 20 units every morning.  She will monitor for hypoglycemia.  She has hypoglycemia awareness.  Also recommend starting checking 2 hours after meals.  She has follow-up with me in December for further medication adjustment if needed. Knee replacement: Continue physical therapy.  Follow up: As scheduled in December for diabetes No orders of the defined types were placed in this encounter.  Meds ordered this encounter  Medications   sulfamethoxazole-trimethoprim (BACTRIM DS) 800-160 MG tablet    Sig: Take 1 tablet by mouth 2 (two) times daily.    Dispense:  14 tablet    Refill:  0      Immunization History  Administered Date(s) Administered   Fluad Quad(high Dose 65+) 04/19/2018, 06/16/2020, 07/01/2020, 04/16/2021, 05/10/2022   Fluad Trivalent(High Dose 65+) 05/24/2023   Influenza Split 06/01/2010   Influenza, High Dose Seasonal PF 05/13/2014, 04/24/2015, 04/12/2017, 04/19/2018, 04/24/2019, 06/16/2020   Influenza, Seasonal, Injecte, Preservative Fre 04/09/2013, 05/03/2016   Influenza,inj,Quad PF,6+ Mos 05/01/2017   Influenza-Unspecified 04/03/2012   Moderna Sars-Covid-2 Vaccination 01/15/2021, 02/12/2021   Pneumococcal Conjugate-13 05/13/2014   Pneumococcal Polysaccharide-23 02/15/2011   Td 08/01/1998   Td (Adult), 2 Lf Tetanus Toxid, Preservative Free 08/01/1998   Tdap 05/01/2004   Unspecified SARS-COV-2 Vaccination 05/11/2021   Zoster Recombinant(Shingrix) 04/24/2019, 08/13/2019  Zoster, Live 02/15/2011    Diabetes Related Lab Review: Lab Results  Component Value Date   HGBA1C 7.7 (H) 04/13/2023   HGBA1C 6.6 (A) 01/06/2023   HGBA1C 7.5 (H) 10/04/2022    Lab Results  Component Value Date   MICROALBUR 1.8 10/04/2022   Lab Results  Component Value Date   CREATININE 1.12 (H) 04/25/2023   BUN 28 (H) 04/25/2023   NA 133 (L) 04/25/2023   K 5.1 04/25/2023   CL 103 04/25/2023   CO2 23 04/25/2023   Lab Results  Component Value Date   CHOL 109  10/04/2022   CHOL 111 09/28/2021   Lab Results  Component Value Date   HDL 57.90 10/04/2022   HDL 55.70 09/28/2021   Lab Results  Component Value Date   LDLCALC 31 10/04/2022   LDLCALC 37 09/28/2021   Lab Results  Component Value Date   TRIG 102.0 10/04/2022   TRIG 91.0 09/28/2021   TRIG 194 (H) 08/13/2020   Lab Results  Component Value Date   CHOLHDL 2 10/04/2022   CHOLHDL 2 09/28/2021   No results found for: "LDLDIRECT" The ASCVD Risk score (Arnett DK, et al., 2019) failed to calculate for the following reasons:   The valid total cholesterol range is 130 to 320 mg/dL I have reviewed the PMH, Fam and Soc history. 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 Farxiga, lantus, met 500 bid    Hypertension associated with diabetes (HCC) 11/08/2010     Priority: High   History of total knee arthroplasty 2024, Dr. 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     Priority: Medium    Gout 07/19/2021     Priority: Medium     On allopurinol    Rotator cuff tear arthropathy 10/22/2020     Priority: Medium    Primary osteoarthritis of left shoulder 07/29/2020     Priority: Medium    DDD (degenerative disc disease), cervical 04/30/2020     Priority: Medium    Osteoarthritis of  left knee 11/05/2018     Priority: Medium    Chronic low back pain 09/05/2017     Priority: Medium    Degenerative lumbar disc 06/04/2015     Priority: Medium    S/P lumbar laminectomy 06/04/2015     Priority: Medium    Restless leg syndrome 05/13/2014     Priority: Medium    Spinal stenosis of lumbar region without neurogenic claudication 09/12/2012     Priority: Medium     Formatting of this note might be different from the original. Right L4/5    GERD (gastroesophageal reflux disease) 05/25/2011     Priority: Medium  Chronic recurrent heartburn symptoms for 3 or 4 years without weight loss, bleeding or dysphagia. Bonds well to daily PPI.Iva Boop, MD, [facility]     Mitral valve prolapse 10/10/2008     Priority: Medium     Formatting of this note might be different from the original. Prolapsing Mitral Valve Leaflet Syndrome - echo [facility] 2008, palpitations are symptomatic    Screening for osteoporosis 10/04/2022     Priority: Low    Dexa 2022: normal. Recheck 77 years    Old tear of meniscus of left knee 09/28/2021     Priority: Low   S/P shoulder replacement, right 12/14/2020     Priority: Low   Chronic pansinusitis 10/04/2017     Priority: Low    Dr. Suszanne Conners, CT scan, sinus surgery 2023    Hemorrhoids, internal, with bleeding 05/25/2011     Priority: Low    Visible on 2005 colonoscopy and endoscopy 05/25/2011.    Chronic allergic rhinitis 08/19/2010     Priority: Low    Social History: Patient  reports that she has never smoked. She has never used smokeless tobacco. She reports that she does not drink alcohol and does not use drugs.  Review of Systems: Ophthalmic: negative for eye pain, loss of vision or double vision Cardiovascular: negative for chest pain Respiratory: negative for SOB or persistent cough Gastrointestinal: negative for abdominal pain Genitourinary: negative for dysuria or gross hematuria MSK: negative for foot lesions Neurologic:  negative for weakness or gait disturbance  Objective  Vitals: BP 138/80   Pulse 74   Temp 97.9 F (36.6 C)   Ht 5\' 4"  (1.626 m)   Wt 190 lb 9.6 oz (86.5 kg)   SpO2 91%   BMI 32.72 kg/m  General: well appearing, no acute distress  Psych:  Alert and oriented, normal mood and affect Right inner buttocks with approximately 3 cm minimally tender erythematous firm mass with fluctuance.  Small vesicle superficially.   Commons side effects, risks, benefits, and alternatives for medications and treatment plan prescribed today were discussed, and the patient expressed understanding of the given instructions. Patient is instructed to call or message via MyChart if he/she has any questions or concerns regarding our treatment plan. No barriers to understanding were identified. We discussed Red Flag symptoms and signs in detail. Patient expressed understanding regarding what to do in case of urgent or emergency type symptoms.  Medication list was reconciled, printed and provided to the patient in AVS. Patient instructions and summary information was reviewed with the patient as documented in the AVS. This note was prepared with assistance of Dragon voice recognition software. Occasional wrong-word or sound-a-like substitutions may have occurred due to the inherent limitations of voice recognition software

## 2023-05-29 ENCOUNTER — Telehealth: Payer: Self-pay

## 2023-05-29 ENCOUNTER — Ambulatory Visit: Payer: Medicare Other

## 2023-05-29 DIAGNOSIS — M25662 Stiffness of left knee, not elsewhere classified: Secondary | ICD-10-CM

## 2023-05-29 DIAGNOSIS — R600 Localized edema: Secondary | ICD-10-CM

## 2023-05-29 DIAGNOSIS — M25562 Pain in left knee: Secondary | ICD-10-CM

## 2023-05-29 NOTE — Therapy (Signed)
OUTPATIENT PHYSICAL THERAPY LOWER EXTREMITY TREATMENT   Patient Name: [name] MRN: [id number] DOB:1946-02-20, 77 y.o., female Today's Date: 05/29/2023  END OF SESSION:  PT End of Session - 05/29/23 0935     Visit Number 10    Number of Visits 12    Date for PT Re-Evaluation 06/30/23    PT Start Time 0931    PT Stop Time 1014    PT Time Calculation (min) 43 min    Activity Tolerance Patient tolerated treatment well    Behavior During Therapy [facility] for tasks assessed/performed                  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) 08/24/2020   Diabetes mellitus without complication (HCC)    takes Trulicity,Jardiance,and Metformin daily.Average fasting blood sugar runs around130   Dysrhythmia    PVCs, has MVP   Fibromyalgia    GERD (gastroesophageal reflux disease)    takes Omeprazole daily   Gout 07/19/2021   On allopurinol   History of bronchitis > 8 yrs ago   History of shingles    HTN (hypertension)    takes Amlodipine and Micardis daily   Hx of colonic polyps    benign   Internal and external hemorrhoids without complication    Joint pain    Mitral valve prolapse    Neuromuscular disorder (HCC)    restless leg syndrome   Nocturia    Pneumonia    PONV (postoperative nausea and vomiting)    when gets injections in joints gets hives.Betadine rash   Restless leg syndrome    takes Requip at bedtime   Seasonal allergies    takes Claritin daily as needed   Urinary frequency    Uterine fibroid    Past Surgical History:  Procedure Laterality Date   BUNIONECTOMY Bilateral    COLONOSCOPY  07/12/2004   diverticulosis, internal and external hemorrhoids   ETHMOIDECTOMY Bilateral 08/15/2022   Procedure: ETHMOIDECTOMY WITH TISSUE REMOVAL;   Surgeon: Newman Pies, MD;  Location: [facility];  Service: ENT;  Laterality: Bilateral;   FINGER ARTHROSCOPY WITH CARPOMETACARPEL (CMC) ARTHROPLASTY Right 09/28/2015   Procedure: RIGHT THUMB TRAPEZIUM EXCISION WITH CARPOMETACARPEL (CMC) ARTHROPLASTY AND TENDON TRANSFER;  Surgeon: Bradly Bienenstock, MD;  Location: [facility];  Service: Orthopedics;  Laterality: Right;   FRONTAL SINUS EXPLORATION Bilateral 08/15/2022   Procedure: FRONTAL RECESS SINUS EXPLORATION WITH NAVIGATION;  Surgeon: Newman Pies, MD;  Location: [facility];  Service: ENT;  Laterality: Bilateral;   HEMORRHOID SURGERY     almost 40 yrs ago   HYSTEROSCOPY WITH D & C  08/23/2000   and resectoscopic myomectomy   LUMBAR DISC SURGERY  03/16/2005   LUMBAR EPIDURAL INJECTION     MAXILLARY ANTROSTOMY Bilateral 08/15/2022   Procedure: ENDOSCOPIC MAXILLARY ANTROSTOMY WITH TISSUE REMOVAL;  Surgeon: Newman Pies, MD;  Location: [facility];  Service: ENT;  Laterality: Bilateral;   PLANTAR FASCIA RELEASE Left 02/03/2010   and torn tendon   REVERSE SHOULDER ARTHROPLASTY Right 12/14/2020   Procedure: REVERSE SHOULDER ARTHROPLASTY;  Surgeon: Beverely Low, MD;  Location: [facility];  Service: Orthopedics;  Laterality: Right;   RIGHT/LEFT HEART CATH AND CORONARY ANGIOGRAPHY N/A 09/07/2020   Procedure: RIGHT/LEFT HEART CATH  AND CORONARY ANGIOGRAPHY;  Surgeon: Yvonne Kendall, MD;  Location: [facility];  Service: Cardiovascular;  Laterality: N/A;   SINUS ENDO W/FUSION Bilateral 08/15/2022   Procedure: SPHENOIDECTOMY WITH TISSUE REMOVAL WITH NAVIGATION;  Surgeon: Newman Pies, MD;  Location: [facility];  Service: ENT;  Laterality: Bilateral;   TENDON TRANSFER Right 09/28/2015   Procedure: TENDON TRANSFER;  Surgeon: Bradly Bienenstock, MD;  Location: [facility];  Service: Orthopedics;  Laterality: Right;   TONSILLECTOMY     TOTAL KNEE ARTHROPLASTY Left 04/24/2023   Procedure: TOTAL KNEE ARTHROPLASTY;  Surgeon:  Ollen Gross, MD;  Location: [facility];  Service: Orthopedics;  Laterality: Left;   TUBAL LIGATION     TURBINATE REDUCTION Bilateral 08/15/2022   Procedure: TURBINATE REDUCTION;  Surgeon: Newman Pies, MD;  Location: [facility];  Service: ENT;  Laterality: Bilateral;   WRIST SURGERY     left, removal of cyst   Patient Active Problem List   Diagnosis Date Noted   History of total knee arthroplasty 2024, Dr. 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   Laryngospasms 04/21/2022   Muscle tension dysphonia 04/21/2022   Old tear of meniscus of left knee 09/28/2021   Combined hyperlipidemia associated with type 2 diabetes mellitus (HCC) 09/28/2021   Gout 07/19/2021   S/P shoulder replacement, right 12/14/2020   Rotator cuff tear arthropathy 10/22/2020   Nonischemic cardiomyopathy (HCC) 10/06/2020   Congestive heart failure with left ventricular systolic dysfunction (HCC) 08/24/2020   Primary osteoarthritis of left shoulder 07/29/2020   DDD (degenerative disc disease), cervical 04/30/2020   Osteoarthritis of left knee 11/05/2018   Chronic pansinusitis 10/04/2017   Chronic low back pain 09/05/2017   Degenerative lumbar disc 06/04/2015   S/P lumbar laminectomy 06/04/2015   Restless leg syndrome 05/13/2014   Type 2 diabetes mellitus with peripheral neuropathy (HCC) 04/09/2013   Spinal stenosis of lumbar region without neurogenic claudication 09/12/2012   GERD (gastroesophageal reflux disease) 05/25/2011   Hemorrhoids, internal, with bleeding 05/25/2011   Hypertension associated with diabetes (HCC) 11/08/2010   Chronic allergic rhinitis 08/19/2010   Mitral valve prolapse 10/10/2008   REFERRING PROVIDER: Eartha Inch, PA   REFERRING DIAG: Unilateral primary osteoarthritis, left knee   THERAPY DIAG:  Acute pain of left knee  Stiffness of left knee, not elsewhere  classified  Localized edema  Rationale for Evaluation and Treatment: Rehabilitation  ONSET DATE: 04/24/23  SUBJECTIVE:   SUBJECTIVE STATEMENT:  Patient reports that she feels like her knee is hurting less, but she accidentally down the stairs "with the wrong foot." She notes that she really "felt it" after doing this. She feels that her knee is about 40-50% better since starting therapy.   PERTINENT HISTORY: Allergies, hypertension, diabetes, congestive heart failure, osteoarthritis, chronic low back pain, chronic kidney disease, anxiety, claustrophobia, and fibromyalgia PAIN:  Are you having pain? Yes: NPRS scale: 3/10 Pain location: left knee Pain description: constant aching, throbbing, and burning  Aggravating factors: standing, walking, and moving her knee  Relieving factors: ice and medication   PRECAUTIONS: None  RED FLAGS: None   WEIGHT BEARING RESTRICTIONS: No  FALLS:  Has patient fallen in last 6 months? No  LIVING ENVIRONMENT: Lives with: lives with their family Lives in: House/apartment Stairs: Yes: Internal: 14 steps; on right going up and External: 1 steps; none Has following equipment at home: Dan Humphreys - 2 wheeled  OCCUPATION: retired  PLOF: Independent  PATIENT GOALS: be able to paint the last two rooms of her house, be able to do her yard work, reduced pain, and improved mobility  NEXT MD VISIT: 05/30/23  OBJECTIVE: all objective measures were assessed at her initial evaluation on 04/27/23 unless otherwise noted  PATIENT SURVEYS:  FOTO 50.16 on 05/29/23  COGNITION: Overall cognitive status: Within functional limits for tasks assessed     SENSATION: Patient reports no numbness or tingling  EDEMA:  Circumferential: L tibiofemoral joint line: 53.3 cn R tibiofemoral joint line: 49.0 cm  POSTURE: forward head and flexed trunk   PALPATION: TTP: left quadriceps, hip adductors, hamstrings, medial and lateral tibiofemoral joint lines  LOWER  EXTREMITY ROM:  Active ROM Right eval Left eval 05/09/23 Left 05/29/23  Hip flexion      Hip extension      Hip abduction      Hip adduction      Hip internal rotation      Hip external rotation      Knee flexion 133 39/ 43 (PROM) 68 degrees(passive) 61/ 75 (PROM)   Knee extension 0 20 -14 degrees (passive). 13  Ankle dorsiflexion      Ankle plantarflexion      Ankle inversion      Ankle eversion       (Blank rows = not tested)  LOWER EXTREMITY MMT: not assessed due to surgical condition  LOWER EXTREMITY SPECIAL TESTS:  Not tested due to surgical condition  GAIT: Assistive device utilized: Environmental consultant - 2 wheeled and Wheelchair (manual) Level of assistance: Modified independence and Total A Comments: Patient utilized a shuffling pattern with the FWW, but utilized for long distance mobility    TODAY'S TREATMENT:                                                                                                                              DATE:                                    05/29/23 EXERCISE LOG  Exercise Repetitions and Resistance Comments  Nustep  L4 x 18 minutes; seat 8-7   Standing gastroc stretch  2.5 minutes   Lunges onto step  14" step x 2.5 minutes   Step up  6" step x 3 minutes  Leading with LLE; demonstrated minimal left knee flexion with step up and down  LAQ 4# x 25 reps each  Alternating LE   Low load prolonged hold knee extension stretch  1.5 minutes Limited by pain   SLR  20 reps  LLE only   Thomas stretch  3 minutes  LLE only    Blank cell = exercise not performed today  05/25/23 EXERCISE LOG  Exercise Repetitions and Resistance Comments  Nustep L4 x 18.5 minutes; seat 8-7   Rocker board  5 minutes    LAQ 3# x 25 reps each  Alternating LE   Seated marching  3# x 25 reps each  Alternating LE  Lunges onto step  8" step x 3 minutes  LLE on step    Blank cell = exercise not performed today  Modalities: no redness or  adverse reaction to today's modalities  Date:  Vaso: Knee, 34 degrees; low pressure, 15 mins, Pain                                   05/22/23 EXERCISE LOG  Exercise Repetitions and Resistance Comments  NuStep  L4 x 17 minutes; seat 8-7   LAQ 2# x 2 minutes  LLE only   Seated heel slide  2 minutes  LLE only   Rocker board  5 minutes  BUE support        Blank cell = exercise not performed today  Manual Therapy Soft Tissue Mobilization: left quadriceps, hamstring, and scar mobilization, for improved soft tissue extensibility Joint Mobilizations: patellar and tibiofemoral, grade I-III Passive ROM: flexion and extension, with overpressure to tolerance   Modalities  Date:  Unattended Estim: Knee, IFC @ 80-150 Hz w/ 40% scan, 15 mins, Pain and Edema Vaso: Knee, 34 degrees; low pressure, 15 mins, Pain and Edema  PATIENT EDUCATION:  Education details: HEP, plan of care, prognosis, healing, and goals for therapy Person educated: Patient Education method: Explanation Education comprehension: verbalized understanding  HOME EXERCISE PROGRAM: [id number]  ASSESSMENT:  CLINICAL IMPRESSION:  Patient is making poor progress with skilled physical therapy objective measures, functional mobility, and progress toward her goals. She continues to exhibit significant left knee range of motion limitations with pain and stiffness being her primary limiting factors. She is able to safely ambulate with a cane, but she exhibits minimal left knee flexion in swing phase and continued knee flexion in the stance phase. Today's interventions focused on improved knee mobility. She required minimal cueing today's interventions for proper biomechanics. She may benefit from additional medical interventions to address her remaining impairments to return to her prior level of function.   OBJECTIVE IMPAIRMENTS: Abnormal gait, decreased activity tolerance, decreased mobility, difficulty walking, decreased ROM, decreased  strength, hypomobility, increased edema, impaired tone, and pain.   ACTIVITY LIMITATIONS: carrying, lifting, sitting, standing, squatting, stairs, transfers, bed mobility, bathing, dressing, locomotion level, and caring for others  PARTICIPATION LIMITATIONS: meal prep, cleaning, laundry, driving, shopping, community activity, and yard work  PERSONAL FACTORS: Past/current experiences, Transportation, and 3+ comorbidities: Allergies, hypertension, diabetes, congestive heart failure, osteoarthritis, chronic low back pain, chronic kidney disease, anxiety, claustrophobia, and fibromyalgia  are also affecting patient's functional outcome.   REHAB POTENTIAL: Good  CLINICAL DECISION MAKING: Evolving/moderate complexity  EVALUATION COMPLEXITY: Moderate   GOALS: Goals reviewed with patient? Yes  SHORT TERM GOALS: Target date: 05/18/23 Patient will be independent with her initial HEP. Baseline: Goal status: MET  2.  Patient will be able to demonstrate active left knee extension within 10 degrees of neutral for improved knee mobility.  Baseline:  Goal status: ON GOING  3.  Patient will be able to demonstrate active left knee flexion to at least 90 degrees for improved function with transfers. Baseline:  Goal status: ON GOING  4.  Patient will be able to safely ambulate  community distances with a walker with the least restrictive assistive device for improved community mobility. Baseline: utilizes a cane for mobility Goal status: MET  LONG TERM GOALS: Target date: 06/08/23  Patient will be independent with her advanced HEP. Baseline:  Goal status: ON GOING  2.  Patient will be able to demonstrate active left knee extension within 5 degrees of neutral for improved gait mechanics. Baseline:  Goal status: ON GOING   3.  Patient will improve her active left knee flexion to at least 120 degrees for improved function navigating stairs. Baseline:  Goal status: ON GOING  4.  Patient will  be able to safely ambulate with minimal to no gait deviations for improved mobility. Baseline:  Goal status: ON GOING  5.  Patient will be able to transfer from sitting to standing with minimal to no difficulty for improved household mobility. Baseline:  Goal status: MET  PLAN:  PT FREQUENCY: 2-3x/week  PT DURATION: 6 weeks  PLANNED INTERVENTIONS: Therapeutic exercises, Therapeutic activity, Neuromuscular re-education, Balance training, Gait training, Patient/Family education, Self Care, Joint mobilization, Stair training, Electrical stimulation, Cryotherapy, Moist heat, Vasopneumatic device, Manual therapy, and Re-evaluation  PLAN FOR NEXT SESSION: Nustep, review and update HEP as needed, manual therapy, gait training, and modalities as needed   Granville Lewis, PT 05/29/2023, 12:47 PM

## 2023-05-29 NOTE — Telephone Encounter (Signed)
Received documents, reviewing

## 2023-05-30 ENCOUNTER — Other Ambulatory Visit (HOSPITAL_COMMUNITY): Payer: Self-pay

## 2023-05-30 MED ORDER — ENTRESTO 49-51 MG PO TABS: 1.0000 | ORAL_TABLET | Freq: Two times a day (BID) | ORAL | 11 refills | Status: DC

## 2023-05-30 NOTE — Telephone Encounter (Signed)
Please send in prescription for Entresto to [id number] - [place], [place] - [address]  [address] [place], [place] [address]  including grant billing information in RX comment [phone], [id number], Group: [id number], QI:[id number])

## 2023-05-30 NOTE — Telephone Encounter (Signed)
Prescription sent to pharmacy.

## 2023-05-31 NOTE — Progress Notes (Signed)
Please place orders for PAT appointment.  

## 2023-05-31 NOTE — Progress Notes (Signed)
Patient PAT completed over the phone. Instructions emailed to patient. Called admitting and they were finishing up with someone else, will call patient back. Told patient to expect a call in about 5 mins. Patient verbalized understanding.   COVID Vaccine Completed:yes  Date of COVID positive in last 90 days: no  PCP - Asencion Partridge, MD Cardiologist - Dietrich Pates, MD  Chest x-ray - n/a EKG - 05/11/23 Epic Stress Test - n/a ECHO - 01/10/22 Epic Cardiac Cath - 09/07/20 Epic Pacemaker/ICD device last checked: n/a Spinal Cord Stimulator: n/a  Bowel Prep - no  Sleep Study - n/a CPAP -   Fasting Blood Sugar - 74-150 Checks Blood Sugar 1 times a day  Last dose of GLP1 agonist-  N/A GLP1 instructions:  N/A   Last dose of SGLT-2 inhibitors-  Farxiga, hold 3 days SGLT-2 instructions: last dose 06/01/23   Blood Thinner Instructions:  n/a Aspirin Instructions: Last Dose:  Activity level: Can go up a flight of stairs and perform activities of daily living without stopping and without symptoms of chest pain or shortness of breath. Slow with stairs due to knee  Anesthesia review: HTN, MVP, CHF, DM2, CKD  Patient denies shortness of breath, fever, cough and chest pain at PAT appointment  Patient verbalized understanding of instructions that were given to them at the PAT appointment. Patient was also instructed that they will need to review over the PAT instructions again at home before surgery.

## 2023-05-31 NOTE — Patient Instructions (Signed)
SURGICAL WAITING ROOM VISITATION  Patients having surgery or a procedure may have no more than 2 support people in the waiting area - these visitors may rotate.    Children under the age of 20 must have an adult with them who is not the patient.  Due to an increase in RSV and influenza rates and associated hospitalizations, children ages 78 and under may not visit patients in [facility] hospitals.  If the patient needs to stay at the hospital during part of their recovery, the visitor guidelines for inpatient rooms apply. Pre-op nurse will coordinate an appropriate time for 1 support person to accompany patient in pre-op.  This support person may not rotate.    Please refer to the [facility] website for the visitor guidelines for Inpatients (after your surgery is over and you are in a regular room).    Your procedure is scheduled on: 06/05/23   Report to [facility] Main Entrance    Report to admitting at 2:55 PM   Call this number if you have problems the morning of surgery [phone]   Do not eat food :After Midnight.   After Midnight you may have the following liquids until 2:10 PM DAY OF SURGERY  Water Non-Citrus Juices (without pulp, NO RED-Apple, White grape, White cranberry) Black Coffee (NO MILK/CREAM OR CREAMERS, sugar ok)  Clear Tea (NO MILK/CREAM OR CREAMERS, sugar ok) regular and decaf                             Plain Jell-O (NO RED)                                           Fruit ices (not with fruit pulp, NO RED)                                     Popsicles (NO RED)                                                               Sports drinks like Gatorade (NO RED)                     If you have questions, please contact your surgeon's office.   FOLLOW BOWEL PREP AND ANY ADDITIONAL PRE OP INSTRUCTIONS YOU RECEIVED FROM YOUR SURGEON'S OFFICE!!!     Oral Hygiene is also important to reduce your risk of infection.                                     Remember - BRUSH YOUR TEETH THE MORNING OF SURGERY WITH YOUR REGULAR TOOTHPASTE  DENTURES WILL BE REMOVED PRIOR TO SURGERY PLEASE DO NOT APPLY "Poly grip" OR ADHESIVES!!!   Stop all vitamins and herbal supplements 7 days before surgery.   Take these medicines the morning of surgery with A SIP OF WATER: Allopurinol, Carvedilol, Flonase, Singulair, Omeprazole, Oxycodone, Pregabalin, Rosuvastatin, Tramadol   DO NOT TAKE ANY ORAL  DIABETIC MEDICATIONS DAY OF YOUR SURGERY  How to Manage Your Diabetes Before and After Surgery  Why is it important to control my blood sugar before and after surgery? Improving blood sugar levels before and after surgery helps healing and can limit problems. A way of improving blood sugar control is eating a healthy diet by:  Eating less sugar and carbohydrates  Increasing activity/exercise  Talking with your doctor about reaching your blood sugar goals High blood sugars (greater than 180 mg/dL) can raise your risk of infections and slow your recovery, so you will need to focus on controlling your diabetes during the weeks before surgery. Make sure that the doctor who takes care of your diabetes knows about your planned surgery including the date and location.  How do I manage my blood sugar before surgery? Check your blood sugar at least 4 times a day, starting 2 days before surgery, to make sure that the level is not too high or low. Check your blood sugar the morning of your surgery when you wake up and every 2 hours until you get to the Short Stay unit. If your blood sugar is less than 70 mg/dL, you will need to treat for low blood sugar: Do not take insulin. Treat a low blood sugar (less than 70 mg/dL) with  cup of clear juice (cranberry or apple), 4 glucose tablets, OR glucose gel. Recheck blood sugar in 15 minutes after treatment (to make sure it is greater than 70 mg/dL). If your blood sugar is not greater than 70 mg/dL on recheck, call [phone] for  further instructions. Report your blood sugar to the short stay nurse when you get to Short Stay.  If you are admitted to the hospital after surgery: Your blood sugar will be checked by the staff and you will probably be given insulin after surgery (instead of oral diabetes medicines) to make sure you have good blood sugar levels. The goal for blood sugar control after surgery is 80-180 mg/dL.   WHAT DO I DO ABOUT MY DIABETES MEDICATION?  Do not take oral diabetes medicines (pills) the morning of surgery.  Hold Farxiga for 3 days. Last dose 06/01/23  THE DAY BEFORE SURGERY, take Lantus and Metformin as prescribed.      THE MORNING OF SURGERY, take 50% of Lantus. Do not take Metformin  Reviewed and Endorsed by [facility] Patient Education Committee, August 2015             You may not have any metal on your body including hair pins, jewelry, and body piercing             Do not wear make-up, lotions, powders, perfumes, or deodorant  Do not wear nail polish including gel and S&S, artificial/acrylic nails, or any other type of covering on natural nails including finger and toenails. If you have artificial nails, gel coating, etc. that needs to be removed by a nail salon please have this removed prior to surgery or surgery may need to be canceled/ delayed if the surgeon/ anesthesia feels like they are unable to be safely monitored.   Do not shave  48 hours prior to surgery.   Do not bring valuables to the hospital. [facility] IS NOT             RESPONSIBLE   FOR VALUABLES.   Contacts, glasses, dentures or bridgework may not be worn into surgery.  DO NOT BRING YOUR HOME MEDICATIONS TO THE HOSPITAL. PHARMACY WILL DISPENSE MEDICATIONS LISTED  ON YOUR MEDICATION LIST TO YOU DURING YOUR ADMISSION IN THE HOSPITAL!    Patients discharged on the day of surgery will not be allowed to drive home.  Someone NEEDS to stay with you for the first 24 hours after anesthesia.              Please  read over the following fact sheets you were given: IF YOU HAVE QUESTIONS ABOUT YOUR PRE-OP INSTRUCTIONS PLEASE CALL [phone] Contras   If you received a COVID test during your pre-op visit  it is requested that you wear a mask when out in public, stay away from anyone that may not be feeling well and notify your surgeon if you develop symptoms. If you test positive for Covid or have been in contact with anyone that has tested positive in the last 10 days please notify you surgeon.    [place] - Preparing for Surgery Before surgery, you can play an important role.  Because skin is not sterile, your skin needs to be as free of germs as possible.  You can reduce the number of germs on your skin by washing with CHG (chlorahexidine gluconate) soap before surgery.  CHG is an antiseptic cleaner which kills germs and bonds with the skin to continue killing germs even after washing. Please DO NOT use if you have an allergy to CHG or antibacterial soaps.  If your skin becomes reddened/irritated stop using the CHG and inform your nurse when you arrive at Short Stay. Do not shave (including legs and underarms) for at least 48 hours prior to the first CHG shower.  You may shave your face/neck.  Please follow these instructions carefully:  1.  Shower with CHG Soap the night before surgery and the  morning of surgery.  2.  If you choose to wash your hair, wash your hair first as usual with your normal  shampoo.  3.  After you shampoo, rinse your hair and body thoroughly to remove the shampoo.                             4.  Use CHG as you would any other liquid soap.  You can apply chg directly to the skin and wash.  Gently with a scrungie or clean washcloth.  5.  Apply the CHG Soap to your body ONLY FROM THE NECK DOWN.   Do   not use on face/ open                           Wound or open sores. Avoid contact with eyes, ears mouth and   genitals (private parts).                       Wash face,  Genitals  (private parts) with your normal soap.             6.  Wash thoroughly, paying special attention to the area where your    surgery  will be performed.  7.  Thoroughly rinse your body with warm water from the neck down.  8.  DO NOT shower/wash with your normal soap after using and rinsing off the CHG Soap.                9.  Pat yourself dry with a clean towel.            10.  Wear clean pajamas.            11.  Place clean sheets on your bed the night of your first shower and do not  sleep with pets. Day of Surgery : Do not apply any lotions/deodorants the morning of surgery.  Please wear clean clothes to the hospital/surgery center.  FAILURE TO FOLLOW THESE INSTRUCTIONS MAY RESULT IN THE CANCELLATION OF YOUR SURGERY  PATIENT SIGNATURE_________________________________  NURSE SIGNATURE__________________________________  ________________________________________________________________________

## 2023-06-01 ENCOUNTER — Other Ambulatory Visit: Payer: Self-pay

## 2023-06-01 ENCOUNTER — Encounter (HOSPITAL_COMMUNITY): Payer: Self-pay

## 2023-06-01 ENCOUNTER — Encounter (HOSPITAL_COMMUNITY)
Admission: RE | Admit: 2023-06-01 | Discharge: 2023-06-01 | Disposition: A | Discharge status: Home / Self Care | Payer: Medicare Other | Source: Ambulatory Visit | Attending: Orthopedic Surgery | Admitting: Orthopedic Surgery

## 2023-06-01 VITALS — Ht 64.0 in | Wt 190.0 lb

## 2023-06-01 DIAGNOSIS — E1142 Type 2 diabetes mellitus with diabetic polyneuropathy: Secondary | ICD-10-CM

## 2023-06-05 ENCOUNTER — Encounter (HOSPITAL_COMMUNITY)
Admission: RE | Disposition: A | Discharge status: Home / Self Care | Payer: Self-pay | Source: Ambulatory Visit | Attending: Orthopedic Surgery

## 2023-06-05 ENCOUNTER — Ambulatory Visit (HOSPITAL_COMMUNITY): Payer: Medicare Other | Admitting: Physician Assistant

## 2023-06-05 ENCOUNTER — Encounter (HOSPITAL_COMMUNITY): Payer: Self-pay | Admitting: Orthopedic Surgery

## 2023-06-05 ENCOUNTER — Ambulatory Visit (HOSPITAL_COMMUNITY)
Admission: RE | Admit: 2023-06-05 | Discharge: 2023-06-05 | Disposition: A | Discharge status: Home / Self Care | Payer: Medicare Other | Source: Ambulatory Visit | Attending: Orthopedic Surgery | Admitting: Orthopedic Surgery

## 2023-06-05 ENCOUNTER — Ambulatory Visit (HOSPITAL_BASED_OUTPATIENT_CLINIC_OR_DEPARTMENT_OTHER): Payer: Medicare Other | Admitting: Anesthesiology

## 2023-06-05 DIAGNOSIS — E1122 Type 2 diabetes mellitus with diabetic chronic kidney disease: Secondary | ICD-10-CM | POA: Insufficient documentation

## 2023-06-05 DIAGNOSIS — Z96652 Presence of left artificial knee joint: Secondary | ICD-10-CM | POA: Insufficient documentation

## 2023-06-05 DIAGNOSIS — Z79899 Other long term (current) drug therapy: Secondary | ICD-10-CM | POA: Diagnosis not present

## 2023-06-05 DIAGNOSIS — E119 Type 2 diabetes mellitus without complications: Secondary | ICD-10-CM

## 2023-06-05 DIAGNOSIS — N1831 Chronic kidney disease, stage 3a: Secondary | ICD-10-CM | POA: Diagnosis not present

## 2023-06-05 DIAGNOSIS — M1990 Unspecified osteoarthritis, unspecified site: Secondary | ICD-10-CM | POA: Diagnosis not present

## 2023-06-05 DIAGNOSIS — F1120 Opioid dependence, uncomplicated: Secondary | ICD-10-CM | POA: Diagnosis not present

## 2023-06-05 DIAGNOSIS — T8482XA Fibrosis due to internal orthopedic prosthetic devices, implants and grafts, initial encounter: Secondary | ICD-10-CM | POA: Diagnosis present

## 2023-06-05 DIAGNOSIS — K219 Gastro-esophageal reflux disease without esophagitis: Secondary | ICD-10-CM | POA: Diagnosis not present

## 2023-06-05 DIAGNOSIS — I130 Hypertensive heart and chronic kidney disease with heart failure and stage 1 through stage 4 chronic kidney disease, or unspecified chronic kidney disease: Secondary | ICD-10-CM | POA: Insufficient documentation

## 2023-06-05 DIAGNOSIS — M24662 Ankylosis, left knee: Secondary | ICD-10-CM | POA: Insufficient documentation

## 2023-06-05 DIAGNOSIS — M797 Fibromyalgia: Secondary | ICD-10-CM | POA: Insufficient documentation

## 2023-06-05 DIAGNOSIS — I5022 Chronic systolic (congestive) heart failure: Secondary | ICD-10-CM | POA: Diagnosis not present

## 2023-06-05 DIAGNOSIS — Z7984 Long term (current) use of oral hypoglycemic drugs: Secondary | ICD-10-CM | POA: Diagnosis not present

## 2023-06-05 HISTORY — PX: KNEE CLOSED REDUCTION: SHX995

## 2023-06-05 LAB — GLUCOSE, CAPILLARY
Glucose-Capillary: 72 mg/dL (ref 70–99)
Glucose-Capillary: 76 mg/dL (ref 70–99)

## 2023-06-05 SURGERY — MANIPULATION, KNEE, CLOSED
Anesthesia: General | Site: Knee | Laterality: Left

## 2023-06-05 MED ORDER — METHOCARBAMOL 1000 MG/10ML IJ SOLN
INTRAMUSCULAR | Status: AC
  Filled 2023-06-05: qty 10

## 2023-06-05 MED ORDER — HYDROMORPHONE HCL 1 MG/ML IJ SOLN
0.2500 mg | INTRAMUSCULAR | Status: DC | PRN
  Administered 2023-06-05 (×4): 0.5 mg via INTRAVENOUS

## 2023-06-05 MED ORDER — HYDROMORPHONE HCL 1 MG/ML IJ SOLN
INTRAMUSCULAR | Status: AC
  Filled 2023-06-05: qty 1

## 2023-06-05 MED ORDER — METHOCARBAMOL 1000 MG/10ML IJ SOLN
500.0000 mg | Freq: Once | INTRAMUSCULAR | Status: AC
  Administered 2023-06-05: 500 mg via INTRAVENOUS

## 2023-06-05 MED ORDER — OXYCODONE HCL 5 MG PO TABS
ORAL_TABLET | ORAL | Status: AC
  Filled 2023-06-05: qty 1

## 2023-06-05 MED ORDER — PROPOFOL 10 MG/ML IV BOLUS
INTRAVENOUS | Status: AC
Start: 2023-06-05 — End: ?
  Filled 2023-06-05: qty 20

## 2023-06-05 MED ORDER — TRAMADOL HCL 50 MG PO TABS: 50.0000 mg | ORAL_TABLET | Freq: Four times a day (QID) | ORAL | 0 refills | Status: DC | PRN

## 2023-06-05 MED ORDER — LACTATED RINGERS IV SOLN: INTRAVENOUS | Status: DC

## 2023-06-05 MED ORDER — PROPOFOL 10 MG/ML IV BOLUS
INTRAVENOUS | Status: DC | PRN
  Administered 2023-06-05: 100 mg via INTRAVENOUS
  Administered 2023-06-05: 30 mg via INTRAVENOUS

## 2023-06-05 MED ORDER — LIDOCAINE 2% (20 MG/ML) 5 ML SYRINGE
INTRAMUSCULAR | Status: DC | PRN
  Administered 2023-06-05: 40 mg via INTRAVENOUS

## 2023-06-05 MED ORDER — LIDOCAINE HCL (PF) 2 % IJ SOLN
INTRAMUSCULAR | Status: AC
  Filled 2023-06-05: qty 5

## 2023-06-05 MED ORDER — ORAL CARE MOUTH RINSE: 15.0000 mL | Freq: Once | OROMUCOSAL | Status: AC

## 2023-06-05 MED ORDER — CHLORHEXIDINE GLUCONATE 0.12 % MT SOLN
15.0000 mL | Freq: Once | OROMUCOSAL | Status: AC
  Administered 2023-06-05: 15 mL via OROMUCOSAL

## 2023-06-05 MED ORDER — OXYCODONE HCL 5 MG PO TABS
5.0000 mg | ORAL_TABLET | Freq: Once | ORAL | Status: AC
  Administered 2023-06-05: 5 mg via ORAL

## 2023-06-05 MED ORDER — ACETAMINOPHEN 10 MG/ML IV SOLN
1000.0000 mg | Freq: Four times a day (QID) | INTRAVENOUS | Status: DC
  Administered 2023-06-05: 1000 mg via INTRAVENOUS
  Filled 2023-06-05: qty 100

## 2023-06-05 SURGICAL SUPPLY — 17 items
BAG COUNTER SPONGE SURGICOUNT (BAG) IMPLANT
BAG SPNG CNTER NS LX DISP (BAG)
BNDG ADH 1X3 SHEER STRL LF (GAUZE/BANDAGES/DRESSINGS) IMPLANT
BNDG ADH THN 3X1 STRL LF (GAUZE/BANDAGES/DRESSINGS)
COVER SURGICAL LIGHT HANDLE (MISCELLANEOUS) ×1 IMPLANT
GAUZE SPONGE 4X4 12PLY STRL (GAUZE/BANDAGES/DRESSINGS) IMPLANT
GLOVE BIO SURGEON STRL SZ 6.5 (GLOVE) ×2 IMPLANT
GLOVE BIO SURGEON STRL SZ8 (GLOVE) ×2 IMPLANT
GLOVE BIOGEL PI IND STRL 7.0 (GLOVE) ×2 IMPLANT
GLOVE BIOGEL PI IND STRL 8 (GLOVE) ×1 IMPLANT
GLOVE SURG SS PI 7.0 STRL IVOR (GLOVE) ×1 IMPLANT
GOWN STRL REUS W/ TWL LRG LVL3 (GOWN DISPOSABLE) ×1 IMPLANT
GOWN STRL REUS W/TWL LRG LVL3 (GOWN DISPOSABLE)
KIT TURNOVER KIT A (KITS) IMPLANT
NDL SAFETY ECLIPSE 18X1.5 (NEEDLE) IMPLANT
SWABSTICK PVP IODINE (MISCELLANEOUS) ×1 IMPLANT
SYR CONTROL 10ML LL (SYRINGE) IMPLANT

## 2023-06-05 NOTE — Anesthesia Postprocedure Evaluation (Signed)
Anesthesia Post Note  Patient: [name]  Procedure(s) Performed: CLOSED MANIPULATION KNEE (Left: Knee)     Patient location during evaluation: PACU Anesthesia Type: General Level of consciousness: awake and alert Pain management: pain level controlled Vital Signs Assessment: post-procedure vital signs reviewed and stable Respiratory status: spontaneous breathing, nonlabored ventilation and respiratory function stable Cardiovascular status: blood pressure returned to baseline and stable Postop Assessment: no apparent nausea or vomiting Anesthetic complications: no  No notable events documented.  Last Vitals:  Vitals:   06/05/23 1600 06/05/23 1618  BP: (!) 164/62 (!) 180/65  Pulse: 62   Resp: 16   Temp:    SpO2: 98%     Last Pain:  Vitals:   06/05/23 1618  TempSrc:   PainSc: 4                  Hildreth Robart,W. EDMOND

## 2023-06-05 NOTE — Op Note (Signed)
  OPERATIVE REPORT   PREOPERATIVE DIAGNOSIS: Arthrofibrosis, Left  knee.   POSTOPERATIVE DIAGNOSIS: Arthrofibrosis, Left knee.   PROCEDURE:  Left  knee closed manipulation.   SURGEON: Ollen Gross, MD   ASSISTANT: None.   ANESTHESIA: General.   COMPLICATIONS: None.   CONDITION: Stable to Recovery.   Pre-manipulation range of motion is 5-75.  Post-manipulation range of  Motion is 0-130  PROCEDURE IN DETAIL: After successful administration of general  anesthetic, exam under anesthesia was performed showing range of motion  5-75 degrees. I then placed my chest against the proximal tibia,  flexing the knee with audible lysis of adhesions. I was easily able to  get the knee flexed to 130  degrees. I then put the knee back in extension and with some  patellar manipulation and gentle pressure got to full  Extension.The patient was subsequently awakened and transported to Recovery in stable condition.

## 2023-06-05 NOTE — Discharge Instructions (Signed)
Kristen Gross, MD [facility] [place] [address], [address] [place], [place] [address] [phone]  KNEE POSTOPERATIVE DIRECTIONS  Knee Rehabilitation, Guidelines Following Surgery  Results after knee surgery are often greatly improved when you follow the exercise, range of motion and muscle strengthening exercises prescribed by your doctor. Safety measures are also important to protect the knee from further injury. If any of these exercises cause you to have increased pain or swelling in your knee joint, decrease the amount until you are comfortable again and slowly increase them. If you have problems or questions, call your caregiver or physical therapist for advice.   HOME CARE INSTRUCTIONS  Remove items at home which could result in a fall. This includes throw rugs or furniture in walking pathways.  ICE to the affected knee as much as tolerated. Icing helps control swelling. If the swelling is well controlled you will be more comfortable and rehab easier. Continue to use ice on the knee for pain and swelling from surgery. You may notice swelling that will progress down to the foot and ankle. This is normal after surgery. Elevate the leg when you are not up walking on it.    Continue to use the breathing machine which will help keep your temperature down. It is common for your temperature to cycle up and down following surgery, especially at night when you are not up moving around and exerting yourself. The breathing machine keeps your lungs expanded and your temperature down. Do not place pillow under the operative knee, focus on keeping the knee straight while resting  DIET You may resume your previous home diet once you are discharged from the hospital.  DRESSING / WOUND CARE / SHOWERING Keep your bulky bandage on for 2 days. On the third post-operative day you may remove the Ace bandage and gauze. There is a waterproof adhesive bandage on your skin  which will stay in place until your first follow-up appointment. Once you remove this you will not need to place another bandage You may begin showering 3 days following surgery, but do not submerge the incision under water.  ACTIVITY For the first 5 days, the key is rest and control of pain and swelling Do your home exercises twice a day starting on post-operative day 3. On the days you go to physical therapy, just do the home exercises once that day. You should rest, ice and elevate the leg for 50 minutes out of every hour. Get up and walk/stretch for 10 minutes per hour. After 5 days you can increase your activity slowly as tolerated. Walk with your walker as instructed. Use the walker until you are comfortable transitioning to a cane. Walk with the cane in the opposite hand of the operative leg. You may discontinue the cane once you are comfortable and walking steadily. Avoid periods of inactivity such as sitting longer than an hour when not asleep. This helps prevent blood clots.  You may discontinue the knee immobilizer once you are able to perform a straight leg raise while lying down. You may resume a sexual relationship in one month or when given the OK by your doctor.  You may return to work once you are cleared by your doctor.  Do not drive a car for 6 weeks or until released by your surgeon.  Do not drive while taking narcotics.  TED HOSE STOCKINGS Wear the elastic stockings on both legs for three weeks following surgery during the day. You may remove  them at night for sleeping.  WEIGHT BEARING Weight bearing as tolerated with assist device (walker, cane, etc) as directed, use it as long as suggested by your surgeon or therapist, typically at least 4-6 weeks.  POSTOPERATIVE CONSTIPATION PROTOCOL Constipation - defined medically as fewer than three stools per week and severe constipation as less than one stool per week.  One of the most common issues patients have following surgery  is constipation.  Even if you have a regular bowel pattern at home, your normal regimen is likely to be disrupted due to multiple reasons following surgery.  Combination of anesthesia, postoperative narcotics, change in appetite and fluid intake all can affect your bowels.  In order to avoid complications following surgery, here are some recommendations in order to help you during your recovery period.  Colace (docusate) - Pick up an over-the-counter form of Colace or another stool softener and take twice a day as long as you are requiring postoperative pain medications.  Take with a full glass of water daily.  If you experience loose stools or diarrhea, hold the colace until you stool forms back up. If your symptoms do not get better within 1 week or if they get worse, check with your doctor. Dulcolax (bisacodyl) - Pick up over-the-counter and take as directed by the product packaging as needed to assist with the movement of your bowels.  Take with a full glass of water.  Use this product as needed if not relieved by Colace only.  MiraLax (polyethylene glycol) - Pick up over-the-counter to have on hand. MiraLax is a solution that will increase the amount of water in your bowels to assist with bowel movements.  Take as directed and can mix with a glass of water, juice, soda, coffee, or tea. Take if you go more than two days without a movement. Do not use MiraLax more than once per day. Call your doctor if you are still constipated or irregular after using this medication for 7 days in a row.  If you continue to have problems with postoperative constipation, please contact the office for further assistance and recommendations.  If you experience "the worst abdominal pain ever" or develop nausea or vomiting, please contact the office immediatly for further recommendations for treatment.  ITCHING If you experience itching with your medications, try taking only a single pain pill, or even half a pain pill at a  time.  You can also use Benadryl over the counter for itching or also to help with sleep.   MEDICATIONS See your medication summary on the "After Visit Summary" that the nursing staff will review with you prior to discharge.  You may have some home medications which will be placed on hold until you complete the course of blood thinner medication.  It is important for you to complete the blood thinner medication as prescribed by your surgeon.  Continue your approved medications as instructed at time of discharge.  PRECAUTIONS If you experience chest pain or shortness of breath - call 911 immediately for transfer to the hospital emergency department.  If you develop a fever greater that 101 F, purulent drainage from wound, increased redness or drainage from wound, foul odor from the wound/dressing, or calf pain - CONTACT YOUR SURGEON.  FOLLOW-UP APPOINTMENTS Make sure you keep all of your appointments after your operation with your surgeon and caregivers. You should call the office at the above phone number and make an appointment for approximately two weeks after the date of your surgery or on the date instructed by your surgeon outlined in the "After Visit Summary".  RANGE OF MOTION AND STRENGTHENING EXERCISES  Rehabilitation of the knee is important following a knee injury or an operation. After just a few days of immobilization, the muscles of the thigh which control the knee become weakened and shrink (atrophy). Knee exercises are designed to build up the tone and strength of the thigh muscles and to improve knee motion. Often times heat used for twenty to thirty minutes before working out will loosen up your tissues and help with improving the range of motion but do not use heat for the first two weeks following surgery. These exercises can be done on a training (exercise) mat, on the floor, on a table or on a bed. Use what ever works the best and is  most comfortable for you Knee exercises include:  Leg Lifts - While your knee is still immobilized in a splint or cast, you can do straight leg raises. Lift the leg to 60 degrees, hold for 3 sec, and slowly lower the leg. Repeat 10-20 times 2-3 times daily. Perform this exercise against resistance later as your knee gets better.  Quad and Hamstring Sets - Tighten up the muscle on the front of the thigh (Quad) and hold for 5-10 sec. Repeat this 10-20 times hourly. Hamstring sets are done by pushing the foot backward against an object and holding for 5-10 sec. Repeat as with quad sets.  Leg Slides: Lying on your back, slowly slide your foot toward your buttocks, bending your knee up off the floor (only go as far as is comfortable). Then slowly slide your foot back down until your leg is flat on the floor again. Angel Wings: Lying on your back spread your legs to the side as far apart as you can without causing discomfort.  A rehabilitation program following serious knee injuries can speed recovery and prevent re-injury in the future due to weakened muscles. Contact your doctor or a physical therapist for more information on knee rehabilitation.   POST-OPERATIVE OPIOID TAPER INSTRUCTIONS: It is important to wean off of your opioid medication as soon as possible. If you do not need pain medication after your surgery it is ok to stop day one. Opioids include: Codeine, Hydrocodone(Norco, Vicodin), Oxycodone(Percocet, oxycontin) and hydromorphone amongst others.  Long term and even short term use of opiods can cause: Increased pain response Dependence Constipation Depression Respiratory depression And more.  Withdrawal symptoms can include Flu like symptoms Nausea, vomiting And more Techniques to manage these symptoms Hydrate well Eat regular healthy meals Stay active Use relaxation techniques(deep breathing, meditating, yoga) Do Not substitute Alcohol to help with tapering If you have been on  opioids for less than two weeks and do not have pain than it is ok to stop all together.  Plan to wean off of opioids This plan should start within one week post op of your joint replacement. Maintain the same interval or time between taking each dose and first decrease the dose.  Cut the total daily intake of opioids by one tablet each day Next start to increase the time between doses. The last dose that should be eliminated is the evening dose.   IF YOU ARE TRANSFERRED TO  A SKILLED REHAB FACILITY If the patient is transferred to a skilled rehab facility following release from the hospital, a list of the current medications will be sent to the facility for the patient to continue.  When discharged from the skilled rehab facility, please have the facility set up the patient's Home Health Physical Therapy prior to being released. Also, the skilled facility will be responsible for providing the patient with their medications at time of release from the facility to include their pain medication, the muscle relaxants, and their blood thinner medication. If the patient is still at the rehab facility at time of the two week follow up appointment, the skilled rehab facility will also need to assist the patient in arranging follow up appointment in our office and any transportation needs.  MAKE SURE YOU:  Understand these instructions.  Get help right away if you are not doing well or get worse.   DENTAL ANTIBIOTICS:  In most cases prophylactic antibiotics for Dental procdeures after total joint surgery are not necessary.  Exceptions are as follows:  1. History of prior total joint infection  2. Severely immunocompromised (Organ Transplant, cancer chemotherapy, Rheumatoid biologic medications such as Humera)  3. Poorly controlled diabetes (A1C &gt; 8.0, blood glucose over 200)  If you have one of these conditions, contact your surgeon for an antibiotic prescription, prior to your dental procedure.     Pick up stool softner and laxative for home use following surgery while on pain medications. Do not submerge incision under water. Please use good hand washing techniques while changing dressing each day. May shower starting three days after surgery. Please use a clean towel to pat the incision dry following showers. Continue to use ice for pain and swelling after surgery. Do not use any lotions or creams on the incision until instructed by your surgeon.

## 2023-06-05 NOTE — Anesthesia Procedure Notes (Signed)
Procedure Name: General with mask airway Date/Time: 06/05/2023 2:57 PM  Performed by: Florene Route, CRNAOxygen Delivery Method: Circle system utilized Preoxygenation: Pre-oxygenation with 100% oxygen Ventilation: Oral airway inserted - appropriate to patient size

## 2023-06-05 NOTE — H&P (Signed)
CC- [name] is a 77 y.o. female who presents with left knee stiffness.  HPI- . Knee Pain: Patient presents with stiffness involving the  left knee. Onset of the symptoms was several weeks ago. Inciting event: .She had a left total Knee Arthroplasty in 9/24 and has had problems with stiffness since. She has under 90 degrees of flexion at 5 weeks post-op despite adequate physical therapy. She presents today for left knee closed manipulation  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) 08/24/2020   Diabetes mellitus without complication (HCC)    takes Trulicity,Jardiance,and Metformin daily.Average fasting blood sugar runs around130   Dysrhythmia    PVCs, has MVP   Fibromyalgia    GERD (gastroesophageal reflux disease)    takes Omeprazole daily   Gout 07/19/2021   On allopurinol   History of bronchitis > 8 yrs ago   History of shingles    HTN (hypertension)    takes Amlodipine and Micardis daily   Hx of colonic polyps    benign   Internal and external hemorrhoids without complication    Joint pain    Mitral valve prolapse    Neuromuscular disorder (HCC)    restless leg syndrome   Nocturia    Pneumonia    PONV (postoperative nausea and vomiting)    when gets injections in joints gets hives.Betadine rash   Restless leg syndrome    takes Requip at bedtime   Seasonal allergies    takes Claritin daily as needed   Urinary frequency    Uterine fibroid     Past Surgical History:  Procedure Laterality Date   BUNIONECTOMY Bilateral    COLONOSCOPY  07/12/2004   diverticulosis, internal and external hemorrhoids   ETHMOIDECTOMY Bilateral 08/15/2022   Procedure: ETHMOIDECTOMY WITH TISSUE REMOVAL;  Surgeon: Newman Pies, MD;  Location: [facility];  Service: ENT;   Laterality: Bilateral;   FINGER ARTHROSCOPY WITH CARPOMETACARPEL (CMC) ARTHROPLASTY Right 09/28/2015   Procedure: RIGHT THUMB TRAPEZIUM EXCISION WITH CARPOMETACARPEL (CMC) ARTHROPLASTY AND TENDON TRANSFER;  Surgeon: Bradly Bienenstock, MD;  Location: [facility];  Service: Orthopedics;  Laterality: Right;   FRONTAL SINUS EXPLORATION Bilateral 08/15/2022   Procedure: FRONTAL RECESS SINUS EXPLORATION WITH NAVIGATION;  Surgeon: Newman Pies, MD;  Location: [facility];  Service: ENT;  Laterality: Bilateral;   HEMORRHOID SURGERY     almost 40 yrs ago   HYSTEROSCOPY WITH D & C  08/23/2000   and resectoscopic myomectomy   LUMBAR DISC SURGERY  03/16/2005   LUMBAR EPIDURAL INJECTION     MAXILLARY ANTROSTOMY Bilateral 08/15/2022   Procedure: ENDOSCOPIC MAXILLARY ANTROSTOMY WITH TISSUE REMOVAL;  Surgeon: Newman Pies, MD;  Location: [facility];  Service: ENT;  Laterality: Bilateral;   PLANTAR FASCIA RELEASE Left 02/03/2010   and torn tendon   REVERSE SHOULDER ARTHROPLASTY Right 12/14/2020   Procedure: REVERSE SHOULDER ARTHROPLASTY;  Surgeon: Beverely Low, MD;  Location: [facility];  Service: Orthopedics;  Laterality: Right;   RIGHT/LEFT HEART CATH AND CORONARY ANGIOGRAPHY N/A 09/07/2020   Procedure: RIGHT/LEFT HEART CATH AND CORONARY ANGIOGRAPHY;  Surgeon: Yvonne Kendall, MD;  Location: [facility];  Service: Cardiovascular;  Laterality: N/A;   SINUS ENDO W/FUSION Bilateral 08/15/2022   Procedure: SPHENOIDECTOMY WITH TISSUE REMOVAL WITH  NAVIGATION;  Surgeon: Newman Pies, MD;  Location: [facility];  Service: ENT;  Laterality: Bilateral;   TENDON TRANSFER Right 09/28/2015   Procedure: TENDON TRANSFER;  Surgeon: Bradly Bienenstock, MD;  Location: [facility];  Service: Orthopedics;  Laterality: Right;   TONSILLECTOMY     TOTAL KNEE ARTHROPLASTY Left 04/24/2023   Procedure: TOTAL KNEE ARTHROPLASTY;  Surgeon: Ollen Gross, MD;  Location: [facility];  Service: Orthopedics;  Laterality: Left;    TUBAL LIGATION     TURBINATE REDUCTION Bilateral 08/15/2022   Procedure: TURBINATE REDUCTION;  Surgeon: Newman Pies, MD;  Location: [facility];  Service: ENT;  Laterality: Bilateral;   WRIST SURGERY     left, removal of cyst    Prior to Admission medications   Medication Sig Start Date End Date Taking? Authorizing Provider  allopurinol (ZYLOPRIM) 100 MG tablet TAKE 1 TABLET BY MOUTH EVERY DAY 04/12/23  Yes Willow Ora, MD  Calcium-Magnesium-Zinc (CAL-MAG-ZINC PO) Take 1 tablet by mouth in the morning.   Yes [provider]  Carboxymeth-Glyc-Polysorb PF (REFRESH OPTIVE MEGA-3) 0.5-1-0.5 % SOLN Place 1-2 drops into both eyes 3 (three) times daily as needed (dry/irritated eyes.).   Yes [provider]  carvedilol (COREG) 3.125 MG tablet Take 1 tablet (3.125 mg total) by mouth 2 (two) times daily. 05/18/23  Yes Pricilla Riffle, MD  cholecalciferol (VITAMIN D) 1000 UNITS tablet Take 1,000 Units by mouth in the morning.   Yes [provider]  cyanocobalamin (VITAMIN B12) 1000 MCG tablet Take 1,000 mcg by mouth in the morning.   Yes [provider]  dapagliflozin propanediol (FARXIGA) 10 MG TABS tablet Take 1 tablet (10 mg total) by mouth daily before breakfast. 03/17/21  Yes Alver Sorrow, NP  famotidine (PEPCID) 20 MG tablet Take 20 mg by mouth at bedtime. 05/18/22  Yes [provider]  fluticasone (FLONASE) 50 MCG/ACT nasal spray SPRAY 1 SPRAY INTO BOTH NOSTRILS DAILY. Patient taking differently: Place 1 spray into both nostrils daily as needed for allergies. 01/18/23  Yes Willow Ora, MD  furosemide (LASIX) 20 MG tablet Take 1 tablet (20 mg total) by mouth as directed. Monday,wed, Friday and sat only. 05/18/23  Yes Pricilla Riffle, MD  insulin glargine (LANTUS SOLOSTAR) 100 UNIT/ML Solostar Pen Inject 20 Units into the skin at bedtime. 05/26/23  Yes Willow Ora, MD  metFORMIN (GLUCOPHAGE) 500 MG tablet TAKE 1 TABLET BY MOUTH 2 TIMES  DAILY WITH A MEAL. 05/02/23  Yes Willow Ora, MD  methocarbamol (ROBAXIN) 500 MG tablet Take 1 tablet (500 mg total) by mouth every 6 (six) hours as needed for muscle spasms. 04/25/23  Yes Edmisten, Kristie L, PA  montelukast (SINGULAIR) 10 MG tablet TAKE 1 TABLET BY MOUTH EVERY DAY 05/03/23  Yes Willow Ora, MD  Multiple Vitamin (MULTIVITAMIN WITH MINERALS) TABS tablet Take 1 tablet by mouth in the morning.   Yes [provider]  omeprazole (PRILOSEC) 40 MG capsule TAKE 1 CAPSULE (40 MG TOTAL) BY MOUTH DAILY. 01/30/23  Yes Willow Ora, MD  ondansetron (ZOFRAN) 4 MG tablet Take 1 tablet (4 mg total) by mouth every 6 (six) hours as needed for nausea. 04/25/23  Yes Edmisten, Kristie L, PA  oxyCODONE (OXY IR/ROXICODONE) 5 MG immediate release tablet Take 5 mg by mouth every 4 (four) hours as needed for severe pain (pain score 7-10) (after therapy). 04/26/23  Yes [provider]  pregabalin (LYRICA) 100 MG capsule Take 1 capsule by  mouth twice daily 04/20/23  Yes Willow Ora, MD  rOPINIRole (REQUIP) 3 MG tablet TAKE 1 TABLET BY MOUTH AT BEDTIME. 07/12/22  Yes Willow Ora, MD  rosuvastatin (CRESTOR) 20 MG tablet Take 1 tablet (20 mg total) by mouth daily. 05/12/23  Yes Pricilla Riffle, MD  sacubitril-valsartan (ENTRESTO) 49-51 MG Take 1 tablet by mouth 2 (two) times daily. 05/30/23  Yes Pricilla Riffle, MD  spironolactone (ALDACTONE) 25 MG tablet Take 0.5 tablets (12.5 mg total) by mouth daily. 05/03/23  Yes Rollene Rotunda, MD  sulfamethoxazole-trimethoprim (BACTRIM DS) 800-160 MG tablet Take 1 tablet by mouth 2 (two) times daily. 05/26/23  Yes Willow Ora, MD  traMADol (ULTRAM) 50 MG tablet Take 1-2 tablets (50-100 mg total) by mouth every 6 (six) hours as needed for moderate pain. 04/25/23  Yes Edmisten, Kristie L, PA  Insulin Pen Needle (ASSURE ID SAFETY PEN NEEDLES) 30G X 5 MM MISC USE AS DIRECTED 10/18/22   Willow Ora, MD   Left knee exam antalgic gait,no warmth or  effusion, reduced range of motion (5-75 degrees), collateral ligaments intact  Physical Examination: General appearance - alert, well appearing, and in no distress Mental status - alert, oriented to person, place, and time Chest - clear to auscultation, no wheezes, rales or rhonchi, symmetric air entry Heart - normal rate, regular rhythm, normal S1, S2, no murmurs, rubs, clicks or gallops Abdomen - soft, nontender, nondistended, no masses or organomegaly Neurological - alert, oriented, normal speech, no focal findings or movement disorder noted   Asessment/Plan--- Left knee arthrofibrosis - Plan left knee closed manipulation. Procedure risks and potential comps discussed with patient who elects to proceed. Goals are decreased pain and increased function with a high likelihood of achieving both

## 2023-06-05 NOTE — Anesthesia Preprocedure Evaluation (Addendum)
Anesthesia Evaluation  Patient identified by MRN, date of birth, ID band Patient awake    Reviewed: Allergy & Precautions, H&P , NPO status , Patient's Chart, lab work & pertinent test results, reviewed documented beta blocker date and time   History of Anesthesia Complications (+) PONV and history of anesthetic complications  Airway Mallampati: II  TM Distance: >3 FB Neck ROM: Full    Dental no notable dental hx. (+) Teeth Intact, Dental Advisory Given   Pulmonary neg pulmonary ROS   Pulmonary exam normal breath sounds clear to auscultation       Cardiovascular hypertension, Pt. on medications and Pt. on home beta blockers +CHF  + dysrhythmias  Rhythm:Regular Rate:Normal     Neuro/Psych   Anxiety     negative neurological ROS     GI/Hepatic Neg liver ROS,GERD  Medicated,,  Endo/Other  diabetes, Type 2, Oral Hypoglycemic Agents    Renal/GU negative Renal ROS  negative genitourinary   Musculoskeletal  (+) Arthritis , Osteoarthritis,  Fibromyalgia -, narcotic dependent  Abdominal   Peds  Hematology negative hematology ROS (+)   Anesthesia Other Findings   Reproductive/Obstetrics negative OB ROS                              Anesthesia Physical Anesthesia Plan  ASA: 3  Anesthesia Plan: General   Post-op Pain Management: Ofirmev IV (intra-op)*   Induction: Intravenous  PONV Risk Score and Plan: 4 or greater and Ondansetron  Airway Management Planned: Mask  Additional Equipment:   Intra-op Plan:   Post-operative Plan:   Informed Consent: I have reviewed the patients History and Physical, chart, labs and discussed the procedure including the risks, benefits and alternatives for the proposed anesthesia with the patient or authorized representative who has indicated his/her understanding and acceptance.     Dental advisory given  Plan Discussed with: CRNA  Anesthesia Plan  Comments:        Anesthesia Quick Evaluation

## 2023-06-05 NOTE — Transfer of Care (Signed)
Immediate Anesthesia Transfer of Care Note  Patient: [name]  Procedure(s) Performed: CLOSED MANIPULATION KNEE (Left: Knee)  Patient Location: PACU  Anesthesia Type:General  Level of Consciousness: awake and alert   Airway & Oxygen Therapy: Patient Spontanous Breathing and Patient connected to face mask oxygen  Post-op Assessment: Report given to RN and Post -op Vital signs reviewed and stable  Post vital signs: Reviewed and stable  Last Vitals:  Vitals Value Taken Time  BP 140/66 06/05/23 1507  Temp    Pulse 60 06/05/23 1509  Resp 19 06/05/23 1509  SpO2 100 % 06/05/23 1509  Vitals shown include unfiled device data.  Last Pain:  Vitals:   06/05/23 1420  TempSrc:   PainSc: 4       Patients Stated Pain Goal: 4 (06/05/23 1420)  Complications: No notable events documented.

## 2023-06-06 ENCOUNTER — Encounter (HOSPITAL_COMMUNITY): Payer: Self-pay | Admitting: Orthopedic Surgery

## 2023-06-06 ENCOUNTER — Ambulatory Visit: Payer: Medicare Other | Attending: Student

## 2023-06-06 ENCOUNTER — Encounter: Payer: Medicare Other | Admitting: Physical Therapy

## 2023-06-06 DIAGNOSIS — R600 Localized edema: Secondary | ICD-10-CM | POA: Insufficient documentation

## 2023-06-06 DIAGNOSIS — M25662 Stiffness of left knee, not elsewhere classified: Secondary | ICD-10-CM | POA: Diagnosis present

## 2023-06-06 DIAGNOSIS — M25562 Pain in left knee: Secondary | ICD-10-CM | POA: Diagnosis present

## 2023-06-06 NOTE — Therapy (Signed)
OUTPATIENT PHYSICAL THERAPY LOWER EXTREMITY TREATMENT   Patient Name: [name] MRN: [id number] DOB:June 01, 1946, 77 y.o., female Today's Date: 06/06/2023  END OF SESSION:  PT End of Session - 06/06/23 1607     Visit Number 11    Number of Visits 18    Date for PT Re-Evaluation 07/28/23    PT Start Time 1516    PT Stop Time 1615    PT Time Calculation (min) 59 min    Activity Tolerance Patient tolerated treatment well    Behavior During Therapy [facility] for tasks assessed/performed                   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) 08/24/2020   Diabetes mellitus without complication (HCC)    takes Trulicity,Jardiance,and Metformin daily.Average fasting blood sugar runs around130   Dysrhythmia    PVCs, has MVP   Fibromyalgia    GERD (gastroesophageal reflux disease)    takes Omeprazole daily   Gout 07/19/2021   On allopurinol   History of bronchitis > 8 yrs ago   History of shingles    HTN (hypertension)    takes Amlodipine and Micardis daily   Hx of colonic polyps    benign   Internal and external hemorrhoids without complication    Joint pain    Mitral valve prolapse    Neuromuscular disorder (HCC)    restless leg syndrome   Nocturia    Pneumonia    PONV (postoperative nausea and vomiting)    when gets injections in joints gets hives.Betadine rash   Restless leg syndrome    takes Requip at bedtime   Seasonal allergies    takes Claritin daily as needed   Urinary frequency    Uterine fibroid    Past Surgical History:  Procedure Laterality Date   BUNIONECTOMY Bilateral    COLONOSCOPY  07/12/2004   diverticulosis, internal and external hemorrhoids   ETHMOIDECTOMY Bilateral 08/15/2022   Procedure: ETHMOIDECTOMY WITH TISSUE REMOVAL;   Surgeon: Newman Pies, MD;  Location: [facility];  Service: ENT;  Laterality: Bilateral;   FINGER ARTHROSCOPY WITH CARPOMETACARPEL (CMC) ARTHROPLASTY Right 09/28/2015   Procedure: RIGHT THUMB TRAPEZIUM EXCISION WITH CARPOMETACARPEL (CMC) ARTHROPLASTY AND TENDON TRANSFER;  Surgeon: Bradly Bienenstock, MD;  Location: [facility];  Service: Orthopedics;  Laterality: Right;   FRONTAL SINUS EXPLORATION Bilateral 08/15/2022   Procedure: FRONTAL RECESS SINUS EXPLORATION WITH NAVIGATION;  Surgeon: Newman Pies, MD;  Location: [facility];  Service: ENT;  Laterality: Bilateral;   HEMORRHOID SURGERY     almost 40 yrs ago   HYSTEROSCOPY WITH D & C  08/23/2000   and resectoscopic myomectomy   KNEE CLOSED REDUCTION Left 06/05/2023   Procedure: CLOSED MANIPULATION KNEE;  Surgeon: Ollen Gross, MD;  Location: [facility];  Service: Orthopedics;  Laterality: Left;   LUMBAR DISC SURGERY  03/16/2005   LUMBAR EPIDURAL INJECTION     MAXILLARY ANTROSTOMY Bilateral 08/15/2022   Procedure: ENDOSCOPIC MAXILLARY ANTROSTOMY WITH TISSUE REMOVAL;  Surgeon: Newman Pies, MD;  Location: [facility];  Service: ENT;  Laterality: Bilateral;   PLANTAR FASCIA RELEASE Left 02/03/2010   and torn tendon   REVERSE SHOULDER ARTHROPLASTY Right 12/14/2020   Procedure: REVERSE SHOULDER ARTHROPLASTY;  Surgeon:  Beverely Low, MD;  Location: [facility];  Service: Orthopedics;  Laterality: Right;   RIGHT/LEFT HEART CATH AND CORONARY ANGIOGRAPHY N/A 09/07/2020   Procedure: RIGHT/LEFT HEART CATH AND CORONARY ANGIOGRAPHY;  Surgeon: Yvonne Kendall, MD;  Location: [facility];  Service: Cardiovascular;  Laterality: N/A;   SINUS ENDO W/FUSION Bilateral 08/15/2022   Procedure: SPHENOIDECTOMY WITH TISSUE REMOVAL WITH NAVIGATION;  Surgeon: Newman Pies, MD;  Location: [facility];  Service: ENT;  Laterality: Bilateral;   TENDON TRANSFER Right 09/28/2015   Procedure: TENDON TRANSFER;  Surgeon: Bradly Bienenstock, MD;   Location: [facility];  Service: Orthopedics;  Laterality: Right;   TONSILLECTOMY     TOTAL KNEE ARTHROPLASTY Left 04/24/2023   Procedure: TOTAL KNEE ARTHROPLASTY;  Surgeon: Ollen Gross, MD;  Location: [facility];  Service: Orthopedics;  Laterality: Left;   TUBAL LIGATION     TURBINATE REDUCTION Bilateral 08/15/2022   Procedure: TURBINATE REDUCTION;  Surgeon: Newman Pies, MD;  Location: [facility];  Service: ENT;  Laterality: Bilateral;   WRIST SURGERY     left, removal of cyst   Patient Active Problem List   Diagnosis Date Noted   Arthrofibrosis of total knee replacement (HCC) 06/05/2023   History of total knee arthroplasty 2024, Dr. 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   Laryngospasms 04/21/2022   Muscle tension dysphonia 04/21/2022   Old tear of meniscus of left knee 09/28/2021   Combined hyperlipidemia associated with type 2 diabetes mellitus (HCC) 09/28/2021   Gout 07/19/2021   S/P shoulder replacement, right 12/14/2020   Rotator cuff tear arthropathy 10/22/2020   Nonischemic cardiomyopathy (HCC) 10/06/2020   Congestive heart failure with left ventricular systolic dysfunction (HCC) 08/24/2020   Primary osteoarthritis of left shoulder 07/29/2020   DDD (degenerative disc disease), cervical 04/30/2020   Osteoarthritis of left knee 11/05/2018   Chronic pansinusitis 10/04/2017   Chronic low back pain 09/05/2017   Degenerative lumbar disc 06/04/2015   S/P lumbar laminectomy 06/04/2015   Restless leg syndrome 05/13/2014   Type 2 diabetes mellitus with peripheral neuropathy (HCC) 04/09/2013   Spinal stenosis of lumbar region without neurogenic claudication 09/12/2012   GERD (gastroesophageal reflux disease) 05/25/2011   Hemorrhoids, internal, with bleeding 05/25/2011   Hypertension associated with diabetes (HCC) 11/08/2010   Chronic allergic rhinitis 08/19/2010    Mitral valve prolapse 10/10/2008   REFERRING PROVIDER: Eartha Inch, PA   REFERRING DIAG: Unilateral primary osteoarthritis, left knee   THERAPY DIAG:  Acute pain of left knee  Stiffness of left knee, not elsewhere classified  Localized edema  Rationale for Evaluation and Treatment: Rehabilitation  ONSET DATE: 04/24/23 (TKA) 06/05/23 (manipulation)  SUBJECTIVE:   SUBJECTIVE STATEMENT:  Patient reports that her knee is hurting a little more after she had a manipulation on 06/05/23. She notes that she was in the hospital after her manipulation for about an hour. She follows up with her surgeon in about 2 weeks.    PERTINENT HISTORY: Allergies, hypertension, diabetes, congestive heart failure, osteoarthritis, chronic low back pain, chronic kidney disease, anxiety, claustrophobia, and fibromyalgia PAIN:  Are you having pain? Yes: NPRS scale: 4-5/10 Pain location: left knee Pain description: constant aching, throbbing, and burning  Aggravating factors: standing, walking, and moving her knee  Relieving factors: ice and medication   PRECAUTIONS: None  RED FLAGS: None   WEIGHT BEARING RESTRICTIONS: No  FALLS:  Has patient fallen in last  6 months? No  LIVING ENVIRONMENT: Lives with: lives with their family Lives in: House/apartment Stairs: Yes: Internal: 14 steps; on right going up and External: 1 steps; none Has following equipment at home: Environmental consultant - 2 wheeled  OCCUPATION: retired  PLOF: Independent  PATIENT GOALS: be able to paint the last two rooms of her house, be able to do her yard work, reduced pain, and improved mobility  NEXT MD VISIT: 06/20/23  OBJECTIVE: all objective measures were assessed at her initial evaluation on 04/27/23 unless otherwise noted  PATIENT SURVEYS:  FOTO 50.16 on 05/29/23  COGNITION: Overall cognitive status: Within functional limits for tasks assessed     SENSATION: Patient reports no numbness or tingling  EDEMA:   Circumferential: L tibiofemoral joint line: 53.3 cn R tibiofemoral joint line: 49.0 cm  POSTURE: forward head and flexed trunk   PALPATION: TTP: left quadriceps, hip adductors, hamstrings, medial and lateral tibiofemoral joint lines  LOWER EXTREMITY ROM:  Active ROM Right eval Left eval 05/09/23 Left 05/29/23 Left 06/06/23  Hip flexion       Hip extension       Hip abduction       Hip adduction       Hip internal rotation       Hip external rotation       Knee flexion 133 39/ 43 (PROM) 68 degrees(passive) 61/ 75 (PROM)  73/ 69 (PROM; limited by pain)   Knee extension 0 20 -14 degrees (passive). 13 13  Ankle dorsiflexion       Ankle plantarflexion       Ankle inversion       Ankle eversion        (Blank rows = not tested)  LOWER EXTREMITY MMT: not assessed due to surgical condition  LOWER EXTREMITY SPECIAL TESTS:  Not tested due to surgical condition  GAIT: Assistive device utilized: Environmental consultant - 2 wheeled and Wheelchair (manual) Level of assistance: Modified independence and Total A Comments: Patient utilized a shuffling pattern with the FWW, but utilized for long distance mobility    TODAY'S TREATMENT:                                                                                                                              DATE:                                    06/06/23 EXERCISE LOG  Exercise Repetitions and Resistance Comments  Nustep  L1 x 18 minutes; seat 11- 9   Gastroc stretch  2.5 minutes    Lunges onto a step  6" step x 3.5 minutes  LLE on step for knee flexion  LAQ 2 minutes  LLE only        Blank cell = exercise not performed today  Manual Therapy Soft Tissue Mobilization: left hamstring and quadriceps, for improved soft tissue extensibility  Joint Mobilizations: left patella and tibiofemoral, grade I-IV Passive ROM: flexion and extension, to tolerance   Modalities: no adverse reaction to today's modalities  Date:  Vaso: Knee, 34 degrees; low pressure,  15 mins, Pain and Tone                                   05/29/23 EXERCISE LOG  Exercise Repetitions and Resistance Comments  Nustep  L4 x 18 minutes; seat 8-7   Standing gastroc stretch  2.5 minutes   Lunges onto step  14" step x 2.5 minutes   Step up  6" step x 3 minutes  Leading with LLE; demonstrated minimal left knee flexion with step up and down  LAQ 4# x 25 reps each  Alternating LE   Low load prolonged hold knee extension stretch  1.5 minutes Limited by pain   SLR  20 reps  LLE only   Thomas stretch  3 minutes  LLE only    Blank cell = exercise not performed today                                    05/25/23 EXERCISE LOG  Exercise Repetitions and Resistance Comments  Nustep L4 x 18.5 minutes; seat 8-7   Rocker board  5 minutes    LAQ 3# x 25 reps each  Alternating LE   Seated marching  3# x 25 reps each  Alternating LE  Lunges onto step  8" step x 3 minutes  LLE on step    Blank cell = exercise not performed today  Modalities: no redness or adverse reaction to today's modalities  Date:  Vaso: Knee, 34 degrees; low pressure, 15 mins, Pain  PATIENT EDUCATION:  Education details: HEP, plan of care, prognosis, healing, and goals for therapy Person educated: Patient Education method: Explanation Education comprehension: verbalized understanding  HOME EXERCISE PROGRAM: [id number]  ASSESSMENT:  CLINICAL IMPRESSION:  Today's reevaluation was performed due to a change in status following a closed manipulation to her left knee on 06/05/23. She was able to demonstrate improved left knee flexion active range of motion since her last appointment on 06/29/23. However, pain remains a limiting factor as evidenced by her limited passive range of motion. She also continues to exhibit left patellar and tibiofemoral hypomobility. Today's interventions focused on active and passive interventions for improved knee knee mobility. She reported that her knee felt good upon the conclusion of  treatment. Recommend that she continue with skilled physical therapy for six additional visits to her current plan of care to address her remaining impairments to maximize her functional mobility.     OBJECTIVE IMPAIRMENTS: Abnormal gait, decreased activity tolerance, decreased mobility, difficulty walking, decreased ROM, decreased strength, hypomobility, increased edema, impaired tone, and pain.   ACTIVITY LIMITATIONS: carrying, lifting, sitting, standing, squatting, stairs, transfers, bed mobility, bathing, dressing, locomotion level, and caring for others  PARTICIPATION LIMITATIONS: meal prep, cleaning, laundry, driving, shopping, community activity, and yard work  PERSONAL FACTORS: Past/current experiences, Transportation, and 3+ comorbidities: Allergies, hypertension, diabetes, congestive heart failure, osteoarthritis, chronic low back pain, chronic kidney disease, anxiety, claustrophobia, and fibromyalgia  are also affecting patient's functional outcome.   REHAB POTENTIAL: Good  CLINICAL DECISION MAKING: Evolving/moderate complexity  EVALUATION COMPLEXITY: Moderate   GOALS: Goals reviewed with patient? Yes  SHORT TERM GOALS: Target date: 05/18/23 Patient will  be independent with her initial HEP. Baseline: Goal status: MET  2.  Patient will be able to demonstrate active left knee extension within 10 degrees of neutral for improved knee mobility.  Baseline:  Goal status: ON GOING  3.  Patient will be able to demonstrate active left knee flexion to at least 90 degrees for improved function with transfers. Baseline:  Goal status: ON GOING  4.  Patient will be able to safely ambulate community distances with a walker with the least restrictive assistive device for improved community mobility. Baseline: utilizes a cane for mobility Goal status: MET  LONG TERM GOALS: Target date: 06/08/23  Patient will be independent with her advanced HEP. Baseline:  Goal status: ON GOING  2.   Patient will be able to demonstrate active left knee extension within 5 degrees of neutral for improved gait mechanics. Baseline:  Goal status: ON GOING   3.  Patient will improve her active left knee flexion to at least 120 degrees for improved function navigating stairs. Baseline:  Goal status: ON GOING  4.  Patient will be able to safely ambulate with minimal to no gait deviations for improved mobility. Baseline:  Goal status: ON GOING  5.  Patient will be able to transfer from sitting to standing with minimal to no difficulty for improved household mobility. Baseline:  Goal status: MET  PLAN:  PT FREQUENCY: 2-3x/week  PT DURATION: 6 weeks  PLANNED INTERVENTIONS: Therapeutic exercises, Therapeutic activity, Neuromuscular re-education, Balance training, Gait training, Patient/Family education, Self Care, Joint mobilization, Stair training, Electrical stimulation, Cryotherapy, Moist heat, Vasopneumatic device, Manual therapy, and Re-evaluation  PLAN FOR NEXT SESSION: Nustep, review and update HEP as needed, manual therapy, gait training, and modalities as needed   Granville Lewis, PT 06/06/2023, 5:58 PM

## 2023-06-08 ENCOUNTER — Ambulatory Visit: Payer: Medicare Other

## 2023-06-08 ENCOUNTER — Encounter: Payer: Medicare Other | Admitting: *Deleted

## 2023-06-08 DIAGNOSIS — M25562 Pain in left knee: Secondary | ICD-10-CM | POA: Diagnosis not present

## 2023-06-08 DIAGNOSIS — R600 Localized edema: Secondary | ICD-10-CM

## 2023-06-08 DIAGNOSIS — M25662 Stiffness of left knee, not elsewhere classified: Secondary | ICD-10-CM

## 2023-06-08 NOTE — Therapy (Signed)
OUTPATIENT PHYSICAL THERAPY LOWER EXTREMITY TREATMENT   Patient Name: [name] MRN: [id number] DOB:Apr 01, 1946, 77 y.o., female Today's Date: 06/08/2023  END OF SESSION:  PT End of Session - 06/08/23 0935     Visit Number 12    Number of Visits 18    Date for PT Re-Evaluation 07/28/23    PT Start Time 0933    PT Stop Time 1013    PT Time Calculation (min) 40 min    Activity Tolerance Patient tolerated treatment well    Behavior During Therapy [facility] for tasks assessed/performed                    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) 08/24/2020   Diabetes mellitus without complication (HCC)    takes Trulicity,Jardiance,and Metformin daily.Average fasting blood sugar runs around130   Dysrhythmia    PVCs, has MVP   Fibromyalgia    GERD (gastroesophageal reflux disease)    takes Omeprazole daily   Gout 07/19/2021   On allopurinol   History of bronchitis > 8 yrs ago   History of shingles    HTN (hypertension)    takes Amlodipine and Micardis daily   Hx of colonic polyps    benign   Internal and external hemorrhoids without complication    Joint pain    Mitral valve prolapse    Neuromuscular disorder (HCC)    restless leg syndrome   Nocturia    Pneumonia    PONV (postoperative nausea and vomiting)    when gets injections in joints gets hives.Betadine rash   Restless leg syndrome    takes Requip at bedtime   Seasonal allergies    takes Claritin daily as needed   Urinary frequency    Uterine fibroid    Past Surgical History:  Procedure Laterality Date   BUNIONECTOMY Bilateral    COLONOSCOPY  07/12/2004   diverticulosis, internal and external hemorrhoids   ETHMOIDECTOMY Bilateral 08/15/2022   Procedure: ETHMOIDECTOMY WITH TISSUE REMOVAL;   Surgeon: Newman Pies, MD;  Location: [facility];  Service: ENT;  Laterality: Bilateral;   FINGER ARTHROSCOPY WITH CARPOMETACARPEL (CMC) ARTHROPLASTY Right 09/28/2015   Procedure: RIGHT THUMB TRAPEZIUM EXCISION WITH CARPOMETACARPEL (CMC) ARTHROPLASTY AND TENDON TRANSFER;  Surgeon: Bradly Bienenstock, MD;  Location: [facility];  Service: Orthopedics;  Laterality: Right;   FRONTAL SINUS EXPLORATION Bilateral 08/15/2022   Procedure: FRONTAL RECESS SINUS EXPLORATION WITH NAVIGATION;  Surgeon: Newman Pies, MD;  Location: [facility];  Service: ENT;  Laterality: Bilateral;   HEMORRHOID SURGERY     almost 40 yrs ago   HYSTEROSCOPY WITH D & C  08/23/2000   and resectoscopic myomectomy   KNEE CLOSED REDUCTION Left 06/05/2023   Procedure: CLOSED MANIPULATION KNEE;  Surgeon: Ollen Gross, MD;  Location: [facility];  Service: Orthopedics;  Laterality: Left;   LUMBAR DISC SURGERY  03/16/2005   LUMBAR EPIDURAL INJECTION     MAXILLARY ANTROSTOMY Bilateral 08/15/2022   Procedure: ENDOSCOPIC MAXILLARY ANTROSTOMY WITH TISSUE REMOVAL;  Surgeon: Newman Pies, MD;  Location: [facility];  Service: ENT;  Laterality: Bilateral;   PLANTAR FASCIA RELEASE Left 02/03/2010   and torn tendon   REVERSE SHOULDER ARTHROPLASTY Right 12/14/2020   Procedure: REVERSE SHOULDER ARTHROPLASTY;  Surgeon: Beverely Low, MD;  Location: [facility];  Service: Orthopedics;  Laterality: Right;   RIGHT/LEFT HEART CATH AND CORONARY ANGIOGRAPHY N/A 09/07/2020   Procedure: RIGHT/LEFT HEART CATH AND CORONARY ANGIOGRAPHY;  Surgeon: Yvonne Kendall, MD;  Location: [facility];  Service: Cardiovascular;  Laterality: N/A;   SINUS ENDO W/FUSION Bilateral 08/15/2022   Procedure: SPHENOIDECTOMY WITH TISSUE REMOVAL WITH NAVIGATION;  Surgeon: Newman Pies, MD;  Location: [facility];  Service: ENT;  Laterality: Bilateral;   TENDON TRANSFER Right 09/28/2015   Procedure: TENDON TRANSFER;  Surgeon: Bradly Bienenstock, MD;   Location: [facility];  Service: Orthopedics;  Laterality: Right;   TONSILLECTOMY     TOTAL KNEE ARTHROPLASTY Left 04/24/2023   Procedure: TOTAL KNEE ARTHROPLASTY;  Surgeon: Ollen Gross, MD;  Location: [facility];  Service: Orthopedics;  Laterality: Left;   TUBAL LIGATION     TURBINATE REDUCTION Bilateral 08/15/2022   Procedure: TURBINATE REDUCTION;  Surgeon: Newman Pies, MD;  Location: [facility];  Service: ENT;  Laterality: Bilateral;   WRIST SURGERY     left, removal of cyst   Patient Active Problem List   Diagnosis Date Noted   Arthrofibrosis of total knee replacement (HCC) 06/05/2023   History of total knee arthroplasty 2024, Dr. 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   Laryngospasms 04/21/2022   Muscle tension dysphonia 04/21/2022   Old tear of meniscus of left knee 09/28/2021   Combined hyperlipidemia associated with type 2 diabetes mellitus (HCC) 09/28/2021   Gout 07/19/2021   S/P shoulder replacement, right 12/14/2020   Rotator cuff tear arthropathy 10/22/2020   Nonischemic cardiomyopathy (HCC) 10/06/2020   Congestive heart failure with left ventricular systolic dysfunction (HCC) 08/24/2020   Primary osteoarthritis of left shoulder 07/29/2020   DDD (degenerative disc disease), cervical 04/30/2020   Osteoarthritis of left knee 11/05/2018   Chronic pansinusitis 10/04/2017   Chronic low back pain 09/05/2017   Degenerative lumbar disc 06/04/2015   S/P lumbar laminectomy 06/04/2015   Restless leg syndrome 05/13/2014   Type 2 diabetes mellitus with peripheral neuropathy (HCC) 04/09/2013   Spinal stenosis of lumbar region without neurogenic claudication 09/12/2012   GERD (gastroesophageal reflux disease) 05/25/2011   Hemorrhoids, internal, with bleeding 05/25/2011   Hypertension associated with diabetes (HCC) 11/08/2010   Chronic allergic rhinitis 08/19/2010    Mitral valve prolapse 10/10/2008   REFERRING PROVIDER: Eartha Inch, PA   REFERRING DIAG: Unilateral primary osteoarthritis, left knee   THERAPY DIAG:  Acute pain of left knee  Stiffness of left knee, not elsewhere classified  Localized edema  Rationale for Evaluation and Treatment: Rehabilitation  ONSET DATE: 04/24/23 (TKA) 06/05/23 (manipulation)  SUBJECTIVE:   SUBJECTIVE STATEMENT:  Patient reports that she did not feel well this morning, but she is feeling better now. She has not taken her pain medication yet this morning to see how she does.   PERTINENT HISTORY: Allergies, hypertension, diabetes, congestive heart failure, osteoarthritis, chronic low back pain, chronic kidney disease, anxiety, claustrophobia, and fibromyalgia PAIN:  Are you having pain? Yes: NPRS scale: 3/10 Pain location: left knee Pain description: constant aching, throbbing, and burning  Aggravating factors: standing, walking, and moving her knee  Relieving factors: ice and medication   PRECAUTIONS: None  RED FLAGS: None   WEIGHT BEARING RESTRICTIONS: No  FALLS:  Has patient fallen in last 6 months? No  LIVING ENVIRONMENT: Lives with: lives with their  family Lives in: House/apartment Stairs: Yes: Internal: 14 steps; on right going up and External: 1 steps; none Has following equipment at home: Walker - 2 wheeled  OCCUPATION: retired  PLOF: Independent  PATIENT GOALS: be able to paint the last two rooms of her house, be able to do her yard work, reduced pain, and improved mobility  NEXT MD VISIT: 06/20/23  OBJECTIVE: all objective measures were assessed at her initial evaluation on 04/27/23 unless otherwise noted  PATIENT SURVEYS:  FOTO 50.16 on 05/29/23  COGNITION: Overall cognitive status: Within functional limits for tasks assessed     SENSATION: Patient reports no numbness or tingling  EDEMA:  Circumferential: L tibiofemoral joint line: 53.3 cn R tibiofemoral joint line:  49.0 cm  POSTURE: forward head and flexed trunk   PALPATION: TTP: left quadriceps, hip adductors, hamstrings, medial and lateral tibiofemoral joint lines  LOWER EXTREMITY ROM:  Active ROM Right eval Left eval 05/09/23 Left 05/29/23 Left 06/06/23  Hip flexion       Hip extension       Hip abduction       Hip adduction       Hip internal rotation       Hip external rotation       Knee flexion 133 39/ 43 (PROM) 68 degrees(passive) 61/ 75 (PROM)  73/ 69 (PROM; limited by pain)   Knee extension 0 20 -14 degrees (passive). 13 13  Ankle dorsiflexion       Ankle plantarflexion       Ankle inversion       Ankle eversion        (Blank rows = not tested)  LOWER EXTREMITY MMT: not assessed due to surgical condition  LOWER EXTREMITY SPECIAL TESTS:  Not tested due to surgical condition  GAIT: Assistive device utilized: Environmental consultant - 2 wheeled and Wheelchair (manual) Level of assistance: Modified independence and Total A Comments: Patient utilized a shuffling pattern with the FWW, but utilized for long distance mobility    TODAY'S TREATMENT:                                                                                                                              DATE:                                    06/08/23 EXERCISE LOG  Exercise Repetitions and Resistance Comments  Nustep L3 x 15 minutes; seat 9-8   Lunges onto step  8" step x 3.5 minutes  LLE on step for knee flexion  Rocker board  5 minutes    Step up  4" step x 15 reps Leading with LLE   LAQ 2.5 minutes w/ 5 second hold  LLE only   Marching on foam  2.5 minutes  BUE support   Standing gastroc stretch  3 minutes     Blank cell =  exercise not performed today                                    06/06/23 EXERCISE LOG  Exercise Repetitions and Resistance Comments  Nustep  L1 x 18 minutes; seat 11- 9   Gastroc stretch  2.5 minutes    Lunges onto a step  6" step x 3.5 minutes  LLE on step for knee flexion  LAQ 2 minutes  LLE only         Blank cell = exercise not performed today  Manual Therapy Soft Tissue Mobilization: left hamstring and quadriceps, for improved soft tissue extensibility Joint Mobilizations: left patella and tibiofemoral, grade I-IV Passive ROM: flexion and extension, to tolerance   Modalities: no adverse reaction to today's modalities  Date:  Vaso: Knee, 34 degrees; low pressure, 15 mins, Pain and Tone                                   05/29/23 EXERCISE LOG  Exercise Repetitions and Resistance Comments  Nustep  L4 x 18 minutes; seat 8-7   Standing gastroc stretch  2.5 minutes   Lunges onto step  14" step x 2.5 minutes   Step up  6" step x 3 minutes  Leading with LLE; demonstrated minimal left knee flexion with step up and down  LAQ 4# x 25 reps each  Alternating LE   Low load prolonged hold knee extension stretch  1.5 minutes Limited by pain   SLR  20 reps  LLE only   Thomas stretch  3 minutes  LLE only    Blank cell = exercise not performed today   PATIENT EDUCATION:  Education details: HEP, plan of care, prognosis, healing, and goals for therapy Person educated: Patient Education method: Explanation Education comprehension: verbalized understanding  HOME EXERCISE PROGRAM: [id number]  ASSESSMENT:  CLINICAL IMPRESSION:  Today's treatment focused on familiar interventions for improved knee mobility with moderate difficulty. She required minimal cueing with step ups for improved knee flexion to prevent circumduction for proper gait mechanics. Fatigue was her primary limitation with today's interventions as she required a brief rest break. She reported that her knee felt "ok" upon the conclusion of treatment. She continues to require skilled physical therapy to address her remaining impairments to maximize her functional mobility  OBJECTIVE IMPAIRMENTS: Abnormal gait, decreased activity tolerance, decreased mobility, difficulty walking, decreased ROM, decreased strength, hypomobility,  increased edema, impaired tone, and pain.   ACTIVITY LIMITATIONS: carrying, lifting, sitting, standing, squatting, stairs, transfers, bed mobility, bathing, dressing, locomotion level, and caring for others  PARTICIPATION LIMITATIONS: meal prep, cleaning, laundry, driving, shopping, community activity, and yard work  PERSONAL FACTORS: Past/current experiences, Transportation, and 3+ comorbidities: Allergies, hypertension, diabetes, congestive heart failure, osteoarthritis, chronic low back pain, chronic kidney disease, anxiety, claustrophobia, and fibromyalgia  are also affecting patient's functional outcome.   REHAB POTENTIAL: Good  CLINICAL DECISION MAKING: Evolving/moderate complexity  EVALUATION COMPLEXITY: Moderate   GOALS: Goals reviewed with patient? Yes  SHORT TERM GOALS: Target date: 05/18/23 Patient will be independent with her initial HEP. Baseline: Goal status: MET  2.  Patient will be able to demonstrate active left knee extension within 10 degrees of neutral for improved knee mobility.  Baseline:  Goal status: ON GOING  3.  Patient will be able to demonstrate active left knee flexion to  at least 90 degrees for improved function with transfers. Baseline:  Goal status: ON GOING  4.  Patient will be able to safely ambulate community distances with a walker with the least restrictive assistive device for improved community mobility. Baseline: utilizes a cane for mobility Goal status: MET  LONG TERM GOALS: Target date: 06/08/23  Patient will be independent with her advanced HEP. Baseline:  Goal status: ON GOING  2.  Patient will be able to demonstrate active left knee extension within 5 degrees of neutral for improved gait mechanics. Baseline:  Goal status: ON GOING   3.  Patient will improve her active left knee flexion to at least 120 degrees for improved function navigating stairs. Baseline:  Goal status: ON GOING  4.  Patient will be able to safely  ambulate with minimal to no gait deviations for improved mobility. Baseline:  Goal status: ON GOING  5.  Patient will be able to transfer from sitting to standing with minimal to no difficulty for improved household mobility. Baseline:  Goal status: MET  PLAN:  PT FREQUENCY: 2-3x/week  PT DURATION: 6 weeks  PLANNED INTERVENTIONS: Therapeutic exercises, Therapeutic activity, Neuromuscular re-education, Balance training, Gait training, Patient/Family education, Self Care, Joint mobilization, Stair training, Electrical stimulation, Cryotherapy, Moist heat, Vasopneumatic device, Manual therapy, and Re-evaluation  PLAN FOR NEXT SESSION: Nustep, review and update HEP as needed, manual therapy, gait training, and modalities as needed   Granville Lewis, PT 06/08/2023, 10:14 AM

## 2023-06-13 ENCOUNTER — Ambulatory Visit: Payer: Medicare Other

## 2023-06-13 DIAGNOSIS — M25562 Pain in left knee: Secondary | ICD-10-CM | POA: Diagnosis not present

## 2023-06-13 DIAGNOSIS — M25662 Stiffness of left knee, not elsewhere classified: Secondary | ICD-10-CM

## 2023-06-13 DIAGNOSIS — R600 Localized edema: Secondary | ICD-10-CM

## 2023-06-13 NOTE — Therapy (Signed)
OUTPATIENT PHYSICAL THERAPY LOWER EXTREMITY TREATMENT   Patient Name: LEEAN VAQUEZ MRN: [id number] DOB:Feb 25, 1946, 77 y.o., female Today's Date: 06/13/2023  END OF SESSION:  PT End of Session - 06/13/23 0934     Visit Number 13    Number of Visits 18    Date for PT Re-Evaluation 07/28/23    PT Start Time 0930    PT Stop Time 1013    PT Time Calculation (min) 43 min    Activity Tolerance Patient tolerated treatment well    Behavior During Therapy [facility] for tasks assessed/performed                    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) 08/24/2020   Diabetes mellitus without complication (HCC)    takes Trulicity,Jardiance,and Metformin daily.Average fasting blood sugar runs around130   Dysrhythmia    PVCs, has MVP   Fibromyalgia    GERD (gastroesophageal reflux disease)    takes Omeprazole daily   Gout 07/19/2021   On allopurinol   History of bronchitis > 8 yrs ago   History of shingles    HTN (hypertension)    takes Amlodipine and Micardis daily   Hx of colonic polyps    benign   Internal and external hemorrhoids without complication    Joint pain    Mitral valve prolapse    Neuromuscular disorder (HCC)    restless leg syndrome   Nocturia    Pneumonia    PONV (postoperative nausea and vomiting)    when gets injections in joints gets hives.Betadine rash   Restless leg syndrome    takes Requip at bedtime   Seasonal allergies    takes Claritin daily as needed   Urinary frequency    Uterine fibroid    Past Surgical History:  Procedure Laterality Date   BUNIONECTOMY Bilateral    COLONOSCOPY  07/12/2004   diverticulosis, internal and external hemorrhoids   ETHMOIDECTOMY Bilateral 08/15/2022   Procedure: ETHMOIDECTOMY WITH TISSUE REMOVAL;   Surgeon: Newman Pies, MD;  Location: [facility];  Service: ENT;  Laterality: Bilateral;   FINGER ARTHROSCOPY WITH CARPOMETACARPEL (CMC) ARTHROPLASTY Right 09/28/2015   Procedure: RIGHT THUMB TRAPEZIUM EXCISION WITH CARPOMETACARPEL (CMC) ARTHROPLASTY AND TENDON TRANSFER;  Surgeon: Bradly Bienenstock, MD;  Location: [facility];  Service: Orthopedics;  Laterality: Right;   FRONTAL SINUS EXPLORATION Bilateral 08/15/2022   Procedure: FRONTAL RECESS SINUS EXPLORATION WITH NAVIGATION;  Surgeon: Newman Pies, MD;  Location: [facility];  Service: ENT;  Laterality: Bilateral;   HEMORRHOID SURGERY     almost 40 yrs ago   HYSTEROSCOPY WITH D & C  08/23/2000   and resectoscopic myomectomy   KNEE CLOSED REDUCTION Left 06/05/2023   Procedure: CLOSED MANIPULATION KNEE;  Surgeon: Ollen Gross, MD;  Location: [facility];  Service: Orthopedics;  Laterality: Left;   LUMBAR DISC SURGERY  03/16/2005   LUMBAR EPIDURAL INJECTION     MAXILLARY ANTROSTOMY Bilateral 08/15/2022   Procedure: ENDOSCOPIC MAXILLARY ANTROSTOMY WITH TISSUE REMOVAL;  Surgeon: Newman Pies, MD;  Location: [facility];  Service: ENT;  Laterality: Bilateral;   PLANTAR FASCIA RELEASE Left 02/03/2010   and torn tendon   REVERSE SHOULDER ARTHROPLASTY Right 12/14/2020   Procedure: REVERSE SHOULDER ARTHROPLASTY;  Surgeon: Beverely Low, MD;  Location: [facility];  Service: Orthopedics;  Laterality: Right;   RIGHT/LEFT HEART CATH AND CORONARY ANGIOGRAPHY N/A 09/07/2020   Procedure: RIGHT/LEFT HEART CATH AND CORONARY ANGIOGRAPHY;  Surgeon: Yvonne Kendall, MD;  Location: [facility];  Service: Cardiovascular;  Laterality: N/A;   SINUS ENDO W/FUSION Bilateral 08/15/2022   Procedure: SPHENOIDECTOMY WITH TISSUE REMOVAL WITH NAVIGATION;  Surgeon: Newman Pies, MD;  Location: [facility];  Service: ENT;  Laterality: Bilateral;   TENDON TRANSFER Right 09/28/2015   Procedure: TENDON TRANSFER;  Surgeon: Bradly Bienenstock, MD;   Location: [facility];  Service: Orthopedics;  Laterality: Right;   TONSILLECTOMY     TOTAL KNEE ARTHROPLASTY Left 04/24/2023   Procedure: TOTAL KNEE ARTHROPLASTY;  Surgeon: Ollen Gross, MD;  Location: [facility];  Service: Orthopedics;  Laterality: Left;   TUBAL LIGATION     TURBINATE REDUCTION Bilateral 08/15/2022   Procedure: TURBINATE REDUCTION;  Surgeon: Newman Pies, MD;  Location: [facility];  Service: ENT;  Laterality: Bilateral;   WRIST SURGERY     left, removal of cyst   Patient Active Problem List   Diagnosis Date Noted   Arthrofibrosis of total knee replacement (HCC) 06/05/2023   History of total knee arthroplasty 2024, Dr. 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   Laryngospasms 04/21/2022   Muscle tension dysphonia 04/21/2022   Old tear of meniscus of left knee 09/28/2021   Combined hyperlipidemia associated with type 2 diabetes mellitus (HCC) 09/28/2021   Gout 07/19/2021   S/P shoulder replacement, right 12/14/2020   Rotator cuff tear arthropathy 10/22/2020   Nonischemic cardiomyopathy (HCC) 10/06/2020   Congestive heart failure with left ventricular systolic dysfunction (HCC) 08/24/2020   Primary osteoarthritis of left shoulder 07/29/2020   DDD (degenerative disc disease), cervical 04/30/2020   Osteoarthritis of left knee 11/05/2018   Chronic pansinusitis 10/04/2017   Chronic low back pain 09/05/2017   Degenerative lumbar disc 06/04/2015   S/P lumbar laminectomy 06/04/2015   Restless leg syndrome 05/13/2014   Type 2 diabetes mellitus with peripheral neuropathy (HCC) 04/09/2013   Spinal stenosis of lumbar region without neurogenic claudication 09/12/2012   GERD (gastroesophageal reflux disease) 05/25/2011   Hemorrhoids, internal, with bleeding 05/25/2011   Hypertension associated with diabetes (HCC) 11/08/2010   Chronic allergic rhinitis 08/19/2010    Mitral valve prolapse 10/10/2008   REFERRING PROVIDER: Eartha Inch, PA   REFERRING DIAG: Unilateral primary osteoarthritis, left knee   THERAPY DIAG:  Acute pain of left knee  Stiffness of left knee, not elsewhere classified  Localized edema  Rationale for Evaluation and Treatment: Rehabilitation  ONSET DATE: 04/24/23 (TKA) 06/05/23 (manipulation)  SUBJECTIVE:   SUBJECTIVE STATEMENT:  Patient reports 5/10 left knee pain today, but reports that pain got up to 7/10 left knee pain last night.   PERTINENT HISTORY: Allergies, hypertension, diabetes, congestive heart failure, osteoarthritis, chronic low back pain, chronic kidney disease, anxiety, claustrophobia, and fibromyalgia PAIN:  Are you having pain? Yes: NPRS scale: 5/10 Pain location: left knee Pain description: constant aching, throbbing, and burning  Aggravating factors: standing, walking, and moving her knee  Relieving factors: ice and medication   PRECAUTIONS: None  RED FLAGS: None   WEIGHT BEARING RESTRICTIONS: No  FALLS:  Has patient fallen in last 6 months? No  LIVING ENVIRONMENT: Lives with: lives with their family Lives in: House/apartment Stairs: Yes: Internal: 14 steps; on right  going up and External: 1 steps; none Has following equipment at home: Walker - 2 wheeled  OCCUPATION: retired  PLOF: Independent  PATIENT GOALS: be able to paint the last two rooms of her house, be able to do her yard work, reduced pain, and improved mobility  NEXT MD VISIT: 06/20/23  OBJECTIVE: all objective measures were assessed at her initial evaluation on 04/27/23 unless otherwise noted  PATIENT SURVEYS:  FOTO 50.16 on 05/29/23  COGNITION: Overall cognitive status: Within functional limits for tasks assessed     SENSATION: Patient reports no numbness or tingling  EDEMA:  Circumferential: L tibiofemoral joint line: 53.3 cn R tibiofemoral joint line: 49.0 cm  POSTURE: forward head and flexed trunk    PALPATION: TTP: left quadriceps, hip adductors, hamstrings, medial and lateral tibiofemoral joint lines  LOWER EXTREMITY ROM:  Active ROM Right eval Left eval 05/09/23 Left 05/29/23 Left 06/06/23  Hip flexion       Hip extension       Hip abduction       Hip adduction       Hip internal rotation       Hip external rotation       Knee flexion 133 39/ 43 (PROM) 68 degrees(passive) 61/ 75 (PROM)  73/ 69 (PROM; limited by pain)   Knee extension 0 20 -14 degrees (passive). 13 13  Ankle dorsiflexion       Ankle plantarflexion       Ankle inversion       Ankle eversion        (Blank rows = not tested)  LOWER EXTREMITY MMT: not assessed due to surgical condition  LOWER EXTREMITY SPECIAL TESTS:  Not tested due to surgical condition  GAIT: Assistive device utilized: Environmental consultant - 2 wheeled and Wheelchair (manual) Level of assistance: Modified independence and Total A Comments: Patient utilized a shuffling pattern with the FWW, but utilized for long distance mobility    TODAY'S TREATMENT:                                                                                                                              DATE:                                    06/13/23 EXERCISE LOG  Exercise Repetitions and Resistance Comments  Nustep L3 x 15 minutes; seat 8-7   Lunges onto step  10" step x 3 minutes  LLE on step for knee flexion  Rocker board  5 minutes    Step up  4" step x 20 reps Leading with LLE   LAQ 2# x 2 mins LLE only   Marching on foam  3.5 mins BUE support   Seated HS Curls Red x 25 reps    Blank cell = exercise not performed today  06/06/23 EXERCISE LOG  Exercise Repetitions and Resistance Comments  Nustep  L1 x 18 minutes; seat 11- 9   Gastroc stretch  2.5 minutes    Lunges onto a step  6" step x 3.5 minutes  LLE on step for knee flexion  LAQ 2 minutes  LLE only        Blank cell = exercise not performed today  Manual Therapy Soft  Tissue Mobilization: left hamstring and quadriceps, for improved soft tissue extensibility Joint Mobilizations: left patella and tibiofemoral, grade I-IV Passive ROM: flexion and extension, to tolerance   Modalities: no adverse reaction to today's modalities  Date:  Vaso: Knee, 34 degrees; low pressure, 15 mins, Pain and Tone                                    PATIENT EDUCATION:  Education details: HEP, plan of care, prognosis, healing, and goals for therapy Person educated: Patient Education method: Explanation Education comprehension: verbalized understanding  HOME EXERCISE PROGRAM: [id number]  ASSESSMENT:  CLINICAL IMPRESSION:  Pt arrives for today's treatment session reporting 5/10 left knee pain.  Pt able to increase FOTO score to 59 today.  Pt able to tolerate progress to seat 7 on the Nustep today with minimal discomfort.  Pt also able to tolerate increased height with box for lunges today as well as resistance with seated LAQs and ham curls.  Pt denied any change in pain at completion of today's treatment session.   OBJECTIVE IMPAIRMENTS: Abnormal gait, decreased activity tolerance, decreased mobility, difficulty walking, decreased ROM, decreased strength, hypomobility, increased edema, impaired tone, and pain.   ACTIVITY LIMITATIONS: carrying, lifting, sitting, standing, squatting, stairs, transfers, bed mobility, bathing, dressing, locomotion level, and caring for others  PARTICIPATION LIMITATIONS: meal prep, cleaning, laundry, driving, shopping, community activity, and yard work  PERSONAL FACTORS: Past/current experiences, Transportation, and 3+ comorbidities: Allergies, hypertension, diabetes, congestive heart failure, osteoarthritis, chronic low back pain, chronic kidney disease, anxiety, claustrophobia, and fibromyalgia  are also affecting patient's functional outcome.   REHAB POTENTIAL: Good  CLINICAL DECISION MAKING: Evolving/moderate complexity  EVALUATION  COMPLEXITY: Moderate   GOALS: Goals reviewed with patient? Yes  SHORT TERM GOALS: Target date: 05/18/23 Patient will be independent with her initial HEP. Baseline: Goal status: MET  2.  Patient will be able to demonstrate active left knee extension within 10 degrees of neutral for improved knee mobility.  Baseline:  Goal status: ON GOING  3.  Patient will be able to demonstrate active left knee flexion to at least 90 degrees for improved function with transfers. Baseline:  Goal status: ON GOING  4.  Patient will be able to safely ambulate community distances with a walker with the least restrictive assistive device for improved community mobility. Baseline: utilizes a cane for mobility Goal status: MET  LONG TERM GOALS: Target date: 06/08/23  Patient will be independent with her advanced HEP. Baseline:  Goal status: ON GOING  2.  Patient will be able to demonstrate active left knee extension within 5 degrees of neutral for improved gait mechanics. Baseline:  Goal status: ON GOING   3.  Patient will improve her active left knee flexion to at least 120 degrees for improved function navigating stairs. Baseline:  Goal status: ON GOING  4.  Patient will be able to safely ambulate with minimal to no gait deviations for improved mobility. Baseline:  Goal status: ON GOING  5.  Patient  will be able to transfer from sitting to standing with minimal to no difficulty for improved household mobility. Baseline:  Goal status: MET  PLAN:  PT FREQUENCY: 2-3x/week  PT DURATION: 6 weeks  PLANNED INTERVENTIONS: Therapeutic exercises, Therapeutic activity, Neuromuscular re-education, Balance training, Gait training, Patient/Family education, Self Care, Joint mobilization, Stair training, Electrical stimulation, Cryotherapy, Moist heat, Vasopneumatic device, Manual therapy, and Re-evaluation  PLAN FOR NEXT SESSION: Nustep, review and update HEP as needed, manual therapy, gait training,  and modalities as needed   Newman Pies, PTA 06/13/2023, 11:07 AM

## 2023-06-15 ENCOUNTER — Ambulatory Visit: Payer: Medicare Other

## 2023-06-15 DIAGNOSIS — M25562 Pain in left knee: Secondary | ICD-10-CM

## 2023-06-15 DIAGNOSIS — R600 Localized edema: Secondary | ICD-10-CM

## 2023-06-15 DIAGNOSIS — M25662 Stiffness of left knee, not elsewhere classified: Secondary | ICD-10-CM

## 2023-06-15 NOTE — Therapy (Signed)
OUTPATIENT PHYSICAL THERAPY LOWER EXTREMITY TREATMENT   Patient Name: [name] MRN: [id number] DOB:Nov 22, 1945, 77 y.o., female Today's Date: 06/15/2023  END OF SESSION:  PT End of Session - 06/15/23 1305     Visit Number 14    Number of Visits 18    Date for PT Re-Evaluation 07/28/23    PT Start Time 1300    PT Stop Time 1344    PT Time Calculation (min) 44 min    Activity Tolerance Patient tolerated treatment well    Behavior During Therapy [facility] for tasks assessed/performed                    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) 08/24/2020   Diabetes mellitus without complication (HCC)    takes Trulicity,Jardiance,and Metformin daily.Average fasting blood sugar runs around130   Dysrhythmia    PVCs, has MVP   Fibromyalgia    GERD (gastroesophageal reflux disease)    takes Omeprazole daily   Gout 07/19/2021   On allopurinol   History of bronchitis > 8 yrs ago   History of shingles    HTN (hypertension)    takes Amlodipine and Micardis daily   Hx of colonic polyps    benign   Internal and external hemorrhoids without complication    Joint pain    Mitral valve prolapse    Neuromuscular disorder (HCC)    restless leg syndrome   Nocturia    Pneumonia    PONV (postoperative nausea and vomiting)    when gets injections in joints gets hives.Betadine rash   Restless leg syndrome    takes Requip at bedtime   Seasonal allergies    takes Claritin daily as needed   Urinary frequency    Uterine fibroid    Past Surgical History:  Procedure Laterality Date   BUNIONECTOMY Bilateral    COLONOSCOPY  07/12/2004   diverticulosis, internal and external hemorrhoids   ETHMOIDECTOMY Bilateral 08/15/2022   Procedure: ETHMOIDECTOMY WITH TISSUE REMOVAL;   Surgeon: Newman Pies, MD;  Location: [facility];  Service: ENT;  Laterality: Bilateral;   FINGER ARTHROSCOPY WITH CARPOMETACARPEL (CMC) ARTHROPLASTY Right 09/28/2015   Procedure: RIGHT THUMB TRAPEZIUM EXCISION WITH CARPOMETACARPEL (CMC) ARTHROPLASTY AND TENDON TRANSFER;  Surgeon: Bradly Bienenstock, MD;  Location: [facility];  Service: Orthopedics;  Laterality: Right;   FRONTAL SINUS EXPLORATION Bilateral 08/15/2022   Procedure: FRONTAL RECESS SINUS EXPLORATION WITH NAVIGATION;  Surgeon: Newman Pies, MD;  Location: [facility];  Service: ENT;  Laterality: Bilateral;   HEMORRHOID SURGERY     almost 40 yrs ago   HYSTEROSCOPY WITH D & C  08/23/2000   and resectoscopic myomectomy   KNEE CLOSED REDUCTION Left 06/05/2023   Procedure: CLOSED MANIPULATION KNEE;  Surgeon: Ollen Gross, MD;  Location: [facility];  Service: Orthopedics;  Laterality: Left;   LUMBAR DISC SURGERY  03/16/2005   LUMBAR EPIDURAL INJECTION     MAXILLARY ANTROSTOMY Bilateral 08/15/2022   Procedure: ENDOSCOPIC MAXILLARY ANTROSTOMY WITH TISSUE REMOVAL;  Surgeon: Newman Pies, MD;  Location: [facility];  Service: ENT;  Laterality: Bilateral;   PLANTAR FASCIA RELEASE Left 02/03/2010   and torn tendon   REVERSE SHOULDER ARTHROPLASTY Right 12/14/2020   Procedure: REVERSE SHOULDER ARTHROPLASTY;  Surgeon: Beverely Low, MD;  Location: [facility];  Service: Orthopedics;  Laterality: Right;   RIGHT/LEFT HEART CATH AND CORONARY ANGIOGRAPHY N/A 09/07/2020   Procedure: RIGHT/LEFT HEART CATH AND CORONARY ANGIOGRAPHY;  Surgeon: Yvonne Kendall, MD;  Location: [facility];  Service: Cardiovascular;  Laterality: N/A;   SINUS ENDO W/FUSION Bilateral 08/15/2022   Procedure: SPHENOIDECTOMY WITH TISSUE REMOVAL WITH NAVIGATION;  Surgeon: Newman Pies, MD;  Location: [facility];  Service: ENT;  Laterality: Bilateral;   TENDON TRANSFER Right 09/28/2015   Procedure: TENDON TRANSFER;  Surgeon: Bradly Bienenstock, MD;   Location: [facility];  Service: Orthopedics;  Laterality: Right;   TONSILLECTOMY     TOTAL KNEE ARTHROPLASTY Left 04/24/2023   Procedure: TOTAL KNEE ARTHROPLASTY;  Surgeon: Ollen Gross, MD;  Location: [facility];  Service: Orthopedics;  Laterality: Left;   TUBAL LIGATION     TURBINATE REDUCTION Bilateral 08/15/2022   Procedure: TURBINATE REDUCTION;  Surgeon: Newman Pies, MD;  Location: [facility];  Service: ENT;  Laterality: Bilateral;   WRIST SURGERY     left, removal of cyst   Patient Active Problem List   Diagnosis Date Noted   Arthrofibrosis of total knee replacement (HCC) 06/05/2023   History of total knee arthroplasty 2024, Dr. 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   Laryngospasms 04/21/2022   Muscle tension dysphonia 04/21/2022   Old tear of meniscus of left knee 09/28/2021   Combined hyperlipidemia associated with type 2 diabetes mellitus (HCC) 09/28/2021   Gout 07/19/2021   S/P shoulder replacement, right 12/14/2020   Rotator cuff tear arthropathy 10/22/2020   Nonischemic cardiomyopathy (HCC) 10/06/2020   Congestive heart failure with left ventricular systolic dysfunction (HCC) 08/24/2020   Primary osteoarthritis of left shoulder 07/29/2020   DDD (degenerative disc disease), cervical 04/30/2020   Osteoarthritis of left knee 11/05/2018   Chronic pansinusitis 10/04/2017   Chronic low back pain 09/05/2017   Degenerative lumbar disc 06/04/2015   S/P lumbar laminectomy 06/04/2015   Restless leg syndrome 05/13/2014   Type 2 diabetes mellitus with peripheral neuropathy (HCC) 04/09/2013   Spinal stenosis of lumbar region without neurogenic claudication 09/12/2012   GERD (gastroesophageal reflux disease) 05/25/2011   Hemorrhoids, internal, with bleeding 05/25/2011   Hypertension associated with diabetes (HCC) 11/08/2010   Chronic allergic rhinitis 08/19/2010    Mitral valve prolapse 10/10/2008   REFERRING PROVIDER: Eartha Inch, PA   REFERRING DIAG: Unilateral primary osteoarthritis, left knee   THERAPY DIAG:  Acute pain of left knee  Stiffness of left knee, not elsewhere classified  Localized edema  Rationale for Evaluation and Treatment: Rehabilitation  ONSET DATE: 04/24/23 (TKA) 06/05/23 (manipulation)  SUBJECTIVE:   SUBJECTIVE STATEMENT:  Patient reports 3/10 left knee pain today.  Pt reports that nerve pain is keeping her up at night.   PERTINENT HISTORY: Allergies, hypertension, diabetes, congestive heart failure, osteoarthritis, chronic low back pain, chronic kidney disease, anxiety, claustrophobia, and fibromyalgia PAIN:  Are you having pain? Yes: NPRS scale: 3/10 Pain location: left knee Pain description: constant aching, throbbing, and burning  Aggravating factors: standing, walking, and moving her knee  Relieving factors: ice and medication   PRECAUTIONS: None  RED FLAGS: None   WEIGHT BEARING RESTRICTIONS: No  FALLS:  Has patient fallen in last 6 months? No  LIVING ENVIRONMENT: Lives with: lives with their family Lives in: House/apartment Stairs: Yes: Internal: 14 steps; on right going  up and External: 1 steps; none Has following equipment at home: Walker - 2 wheeled  OCCUPATION: retired  PLOF: Independent  PATIENT GOALS: be able to paint the last two rooms of her house, be able to do her yard work, reduced pain, and improved mobility  NEXT MD VISIT: 06/20/23  OBJECTIVE: all objective measures were assessed at her initial evaluation on 04/27/23 unless otherwise noted  PATIENT SURVEYS:  FOTO 50.16 on 05/29/23  COGNITION: Overall cognitive status: Within functional limits for tasks assessed     SENSATION: Patient reports no numbness or tingling  EDEMA:  Circumferential: L tibiofemoral joint line: 53.3 cn R tibiofemoral joint line: 49.0 cm  POSTURE: forward head and flexed trunk    PALPATION: TTP: left quadriceps, hip adductors, hamstrings, medial and lateral tibiofemoral joint lines  LOWER EXTREMITY ROM:  Active ROM Right eval Left eval 05/09/23 Left 05/29/23 Left 06/06/23  Hip flexion       Hip extension       Hip abduction       Hip adduction       Hip internal rotation       Hip external rotation       Knee flexion 133 39/ 43 (PROM) 68 degrees(passive) 61/ 75 (PROM)  73/ 69 (PROM; limited by pain)   Knee extension 0 20 -14 degrees (passive). 13 13  Ankle dorsiflexion       Ankle plantarflexion       Ankle inversion       Ankle eversion        (Blank rows = not tested)  LOWER EXTREMITY MMT: not assessed due to surgical condition  LOWER EXTREMITY SPECIAL TESTS:  Not tested due to surgical condition  GAIT: Assistive device utilized: Environmental consultant - 2 wheeled and Wheelchair (manual) Level of assistance: Modified independence and Total A Comments: Patient utilized a shuffling pattern with the FWW, but utilized for long distance mobility    TODAY'S TREATMENT:                                                                                                                              DATE:                                    06/15/23 EXERCISE LOG  Exercise Repetitions and Resistance Comments  Nustep L3 x 15 minutes; seat 8-7   Lunges onto step  10" step x 3 minutes  LLE on step for knee flexion  Rocker board  5 minutes    Step up  6" step x 15 reps Leading with LLE   LAQ 2# x 2.5 mins LLE only   Seated Marches 2# x 2.5 mins BUE support   Seated Hip Abduction Red x 25 reps   Seated HS Curls Red x 25 reps    Blank cell = exercise not performed today  06/06/23 EXERCISE LOG  Exercise Repetitions and Resistance Comments  Nustep  L1 x 18 minutes; seat 11- 9   Gastroc stretch  2.5 minutes    Lunges onto a step  6" step x 3.5 minutes  LLE on step for knee flexion  LAQ 2 minutes  LLE only        Blank cell = exercise not  performed today  Manual Therapy Soft Tissue Mobilization: left hamstring and quadriceps, for improved soft tissue extensibility Joint Mobilizations: left patella and tibiofemoral, grade I-IV Passive ROM: flexion and extension, to tolerance   Modalities: no adverse reaction to today's modalities  Date:  Vaso: Knee, 34 degrees; low pressure, 15 mins, Pain and Tone                                    PATIENT EDUCATION:  Education details: HEP, plan of care, prognosis, healing, and goals for therapy Person educated: Patient Education method: Explanation Education comprehension: verbalized understanding  HOME EXERCISE PROGRAM: [id number]  ASSESSMENT:  CLINICAL IMPRESSION:  Pt arrives for today's treatment session reporting 3/10 left knee pain.  Pt able to tolerate increase in step height to six inches today.  Pt bale to tolerate increased time and reps with all seated exercises today.  Pt continues to be most limited by pain at this time.  Pt denied any change in pain at completion of today's treatment session.  OBJECTIVE IMPAIRMENTS: Abnormal gait, decreased activity tolerance, decreased mobility, difficulty walking, decreased ROM, decreased strength, hypomobility, increased edema, impaired tone, and pain.   ACTIVITY LIMITATIONS: carrying, lifting, sitting, standing, squatting, stairs, transfers, bed mobility, bathing, dressing, locomotion level, and caring for others  PARTICIPATION LIMITATIONS: meal prep, cleaning, laundry, driving, shopping, community activity, and yard work  PERSONAL FACTORS: Past/current experiences, Transportation, and 3+ comorbidities: Allergies, hypertension, diabetes, congestive heart failure, osteoarthritis, chronic low back pain, chronic kidney disease, anxiety, claustrophobia, and fibromyalgia  are also affecting patient's functional outcome.   REHAB POTENTIAL: Good  CLINICAL DECISION MAKING: Evolving/moderate complexity  EVALUATION COMPLEXITY:  Moderate   GOALS: Goals reviewed with patient? Yes  SHORT TERM GOALS: Target date: 05/18/23 Patient will be independent with her initial HEP. Baseline: Goal status: MET  2.  Patient will be able to demonstrate active left knee extension within 10 degrees of neutral for improved knee mobility.  Baseline:  Goal status: ON GOING  3.  Patient will be able to demonstrate active left knee flexion to at least 90 degrees for improved function with transfers. Baseline:  Goal status: ON GOING  4.  Patient will be able to safely ambulate community distances with a walker with the least restrictive assistive device for improved community mobility. Baseline: utilizes a cane for mobility Goal status: MET  LONG TERM GOALS: Target date: 06/08/23  Patient will be independent with her advanced HEP. Baseline:  Goal status: ON GOING  2.  Patient will be able to demonstrate active left knee extension within 5 degrees of neutral for improved gait mechanics. Baseline:  Goal status: ON GOING   3.  Patient will improve her active left knee flexion to at least 120 degrees for improved function navigating stairs. Baseline:  Goal status: ON GOING  4.  Patient will be able to safely ambulate with minimal to no gait deviations for improved mobility. Baseline:  Goal status: ON GOING  5.  Patient will be able to transfer from sitting to standing with minimal  to no difficulty for improved household mobility. Baseline:  Goal status: MET  PLAN:  PT FREQUENCY: 2-3x/week  PT DURATION: 6 weeks  PLANNED INTERVENTIONS: Therapeutic exercises, Therapeutic activity, Neuromuscular re-education, Balance training, Gait training, Patient/Family education, Self Care, Joint mobilization, Stair training, Electrical stimulation, Cryotherapy, Moist heat, Vasopneumatic device, Manual therapy, and Re-evaluation  PLAN FOR NEXT SESSION: Nustep, review and update HEP as needed, manual therapy, gait training, and  modalities as needed   Newman Pies, PTA 06/15/2023, 1:47 PM

## 2023-06-20 ENCOUNTER — Encounter: Payer: Self-pay | Admitting: Internal Medicine

## 2023-06-20 ENCOUNTER — Ambulatory Visit: Payer: Medicare Other | Admitting: Internal Medicine

## 2023-06-20 VITALS — BP 138/76 | HR 62 | Ht 64.0 in | Wt 187.5 lb

## 2023-06-20 DIAGNOSIS — J329 Chronic sinusitis, unspecified: Secondary | ICD-10-CM

## 2023-06-20 DIAGNOSIS — H814 Vertigo of central origin: Secondary | ICD-10-CM | POA: Diagnosis not present

## 2023-06-20 DIAGNOSIS — R42 Dizziness and giddiness: Secondary | ICD-10-CM | POA: Insufficient documentation

## 2023-06-20 DIAGNOSIS — R2689 Other abnormalities of gait and mobility: Secondary | ICD-10-CM

## 2023-06-20 DIAGNOSIS — H532 Diplopia: Secondary | ICD-10-CM

## 2023-06-20 DIAGNOSIS — R110 Nausea: Secondary | ICD-10-CM | POA: Diagnosis not present

## 2023-06-20 DIAGNOSIS — M436 Torticollis: Secondary | ICD-10-CM

## 2023-06-20 MED ORDER — CEFDINIR 300 MG PO CAPS
300.0000 mg | ORAL_CAPSULE | Freq: Two times a day (BID) | ORAL | 0 refills | Status: DC
Start: 2023-06-20 — End: 2023-07-07

## 2023-06-20 MED ORDER — ONDANSETRON HCL 4 MG/2ML IJ SOLN
4.0000 mg | Freq: Once | INTRAMUSCULAR | Status: AC
  Administered 2023-06-20: 4 mg via INTRAMUSCULAR

## 2023-06-20 MED ORDER — PROMETHAZINE HCL 25 MG/ML IJ SOLN
25.0000 mg | Freq: Once | INTRAMUSCULAR | Status: AC
  Administered 2023-06-20: 25 mg via INTRAMUSCULAR

## 2023-06-20 MED ORDER — MECLIZINE HCL 25 MG PO TABS: 25.0000 mg | ORAL_TABLET | Freq: Three times a day (TID) | ORAL | 1 refills | Status: DC | PRN

## 2023-06-20 MED ORDER — ONDANSETRON 4 MG PO TBDP: 4.0000 mg | ORAL_TABLET | Freq: Three times a day (TID) | ORAL | 2 refills | Status: DC | PRN

## 2023-06-20 MED ORDER — PROMETHAZINE HCL 25 MG PO TABS: 25.0000 mg | ORAL_TABLET | Freq: Three times a day (TID) | ORAL | 0 refills | Status: DC | PRN

## 2023-06-20 NOTE — Assessment & Plan Note (Signed)
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  Response to Treatment:  Partial improvement with multiple Epley maneuvers Required parenteral anti-emetics for symptom control 80% improvement reported on same-day follow-up  Risk Assessment:  High fall risk given multiple comorbidities Concern for central etiology given exam findings Post-operative status increasing thromboembolic risk  Management Plan: a) Immediate Interventions:  Epley maneuver performed x3 with partial improvement Zofran 4mg  IM at 10:11 AM Phenergan IM at 10:16 AM Meclizine prescribed for ongoing management  b) Monitoring:  PCP appointment arranged for tomorrow Alternative follow-up in our office within 48 hours if needed Phone follow-up completed today with 80% improvement  PROCEDURES PERFORMED: - Canalith Repositioning (Epley Maneuver) x3 - [id number] - Medication administration:   * Zofran 4mg  IM at 10:11 AM   * Phenergan IM at 10:16 AM  FOLLOW-UP PLAN: 1. Immediate (within 24-48 hours):    - PCP appointment or return to our office    - Monitor for neurological symptoms    - Assess response to medications  2. Short-term:    - Neurology referral to evaluate:      * Chronic diplopia      * Atypical vestibular findings    - Consider VNG testing    - Consider outpatient MRI based on follow-up exam  PATIENT INSTRUCTIONS: 1. Call PCP tomorrow morning for appointment 2. Return to our office if unable to see PCP 3. Continue prescribed medications 4. May perform home Epley if symptoms recur 5. Seek  immediate emergency care for:    - New neurological symptoms    - Severe vertigo recurrence    - Falls    - Speech/vision changes    - Confusion    - Chest pain/shortness of breath  Phone follow-up completed on 06/20/2023 at 7pm : Patient reports 80% improvement in vertigo symptoms. Discussed warning signs and follow-up plan as above. Patient verbalized understanding.   Quality Metrics: - CPT [id number] for Epley procedure - Documented shared decision-making - Fall precautions implemented - Emergency instructions provided

## 2023-06-20 NOTE — Progress Notes (Addendum)
[facility]: [phone]   -- Medical Office Visit --  Patient:  [name]      Age: 77 y.o.       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  Response to Treatment:  Partial improvement with multiple Epley maneuvers Required parenteral anti-emetics for symptom control 80% improvement reported on same-day follow-up  Risk Assessment:  High fall risk given multiple comorbidities Concern for central etiology given exam findings Post-operative status increasing thromboembolic risk  Management Plan: a) Immediate Interventions:  Epley maneuver performed x3 with partial improvement Zofran 4mg  IM at 10:11 AM Phenergan IM at 10:16 AM Meclizine prescribed for ongoing management  b) Monitoring:  PCP appointment arranged for tomorrow Alternative follow-up in our office within 48 hours if needed Phone follow-up completed today with 80% improvement  PROCEDURES PERFORMED: - Canalith Repositioning (Epley Maneuver) x3 - [phone] - Medication administration:   * Zofran 4mg  IM at 10:11 AM   * Phenergan IM at 10:16 AM  FOLLOW-UP PLAN: 1. Immediate (within 24-48 hours):    - PCP appointment or return to our office    - Monitor for neurological symptoms    - Assess response to medications  2. Short-term:    - Neurology referral to evaluate:      * Chronic  diplopia      * Atypical vestibular findings    - Consider VNG testing    - Consider outpatient MRI based on follow-up exam  PATIENT INSTRUCTIONS: 1. Call PCP tomorrow morning for appointment 2. Return to our office if unable to see PCP 3. Continue prescribed medications 4. May perform home Epley if symptoms recur 5. Seek immediate emergency care for:    - New neurological symptoms    - Severe vertigo recurrence    - Falls    - Speech/vision changes    - Confusion    - Chest pain/shortness of breath  Phone follow-up completed on 06/20/2023 at 7pm : Patient reports 80% improvement in vertigo symptoms. Discussed warning signs and follow-up plan as above. Patient verbalized understanding.   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  FOLLOW-UP PLAN:  Immediate (24-48 hours):  PCP appointment scheduled for tomorrow Alternative follow-up in our  office if needed Phone availability for concerns ER if symptom(s) return or new symptom(s) develop   Monitoring Parameters: Vertigo frequency/severity Balance/fall risk Diplopia progression Post-TKA  recovery    EMERGENCY PRECAUTIONS REVIEWED: Immediate ED evaluation for:  New/worsening neurological symptoms Severe vertigo recurrence Falls or near-falls Speech changes Vision changes Chest pain/severe dyspnea Confusion  PATIENT EDUCATION:  Detailed vertigo precautions provided Home Epley maneuver instructions given Medication schedule reviewed Warning signs discussed Fall prevention strategies reviewed Follow-up plan verified Questions addressed  COORDINATION OF CARE:  PCP notified of visit Follow-up arrangements confirmed Family involved in care plan Transport assistance arranged  TIME SPENT: 75 minutes total:  Detailed history review Complex examination Medical decision making Care coordination Patient/family education Documentation (Excludes 20 minutes for separately billable procedure)  SUBJECTIVE: 77 y.o. female who has GERD (gastroesophageal reflux disease); Hemorrhoids, internal, with bleeding; Osteoarthritis of left knee; Chronic allergic rhinitis; Chronic pansinusitis; Chronic low back pain; Type 2 diabetes mellitus with peripheral neuropathy (HCC); Primary osteoarthritis of left shoulder; DDD (degenerative disc disease), cervical; Hypertension associated with diabetes (HCC); Degenerative lumbar disc; Spinal stenosis of lumbar region without neurogenic claudication; Mitral valve prolapse; Restless leg syndrome; S/P lumbar laminectomy; Congestive heart failure with left ventricular systolic dysfunction (HCC); Nonischemic cardiomyopathy (HCC); S/P shoulder replacement, right; Gout; Old tear of meniscus of left knee; Combined hyperlipidemia associated with type 2 diabetes mellitus (HCC); Vocal fold paresis, left; Laryngospasms; Rotator cuff tear arthropathy; Muscle tension dysphonia; Screening for osteoporosis; Stage 3a chronic kidney disease (HCC); History of total knee arthroplasty 2024, Dr. Despina Hick; and Arthrofibrosis of total knee replacement [facility]) on their  problem list.. Main reasons for visit/main concerns/chief complaint: Dizziness (Started Sunday )   ------------------------------------------------------------------------------------------------------------------------ AI-Extracted: Discussed the use of AI scribe software for clinical note transcription with the patient, who gave verbal consent to proceed.  HISTORY OF PRESENT ILLNESS: Patient presented with acute onset severe vertigo that began two days ago while at church. The vertigo is described as a severe spinning sensation causing significant imbalance requiring support for ambulation. Associated symptoms include nausea with two episodes of vomiting despite taking oral Zofran, and a severe headache rated 5/10 over bridge of nose. The patient reports one similar but less severe episode approximately 40 years ago.  The headache began yesterday, localized across the bridge of the nose and forehead, with associated sinus congestion and green nasal drainage suggesting concurrent sinusitis. The patient denies fever, urinary symptoms, or other GI symptoms besides the vomiting. Of note, the patient reports a 42-month history of intermittent binocular diplopia, particularly noticeable when watching television or viewing objects at a distance. The diplopia is not associated with eye pain.  Additional symptoms include occasional difficulty breathing without associated chest pain, palpitations, or cough. Patient also notes neck stiffness for the past couple days but denies recent trauma or falls. The patient has recent history of knee surgery and sinus operation.  Initial oral Zofran provided no relief of nausea/vomiting. During the visit, injectable Zofran and Phenergan were administered followed by multiple iterations of the Epley maneuver, resulting in partial improvement of vertigo symptoms. The patient denies recent weight loss, fatigue, night sweats, rashes, or changes in hearing. No ear pain or  discharge reported.  Purulent nasal drainage was observed after the first "Epley" maneuver maneuver  Problem list overviews that were updated at today's visit:No problems updated.  Med reconciliation: Current Outpatient Medications on File Prior to Visit  Medication Sig   allopurinol (ZYLOPRIM) 100 MG tablet TAKE 1 TABLET BY MOUTH EVERY DAY  Calcium-Magnesium-Zinc (CAL-MAG-ZINC PO) Take 1 tablet by mouth in the morning.   Carboxymeth-Glyc-Polysorb PF (REFRESH OPTIVE MEGA-3) 0.5-1-0.5 % SOLN Place 1-2 drops into both eyes 3 (three) times daily as needed (dry/irritated eyes.).   carvedilol (COREG) 3.125 MG tablet Take 1 tablet (3.125 mg total) by mouth 2 (two) times daily.   cholecalciferol (VITAMIN D) 1000 UNITS tablet Take 1,000 Units by mouth in the morning.   cyanocobalamin (VITAMIN B12) 1000 MCG tablet Take 1,000 mcg by mouth in the morning.   dapagliflozin propanediol (FARXIGA) 10 MG TABS tablet Take 1 tablet (10 mg total) by mouth daily before breakfast.   famotidine (PEPCID) 20 MG tablet Take 20 mg by mouth at bedtime.   fluticasone (FLONASE) 50 MCG/ACT nasal spray SPRAY 1 SPRAY INTO BOTH NOSTRILS DAILY. (Patient taking differently: Place 1 spray into both nostrils daily as needed for allergies.)   furosemide (LASIX) 20 MG tablet Take 1 tablet (20 mg total) by mouth as directed. Monday,wed, Friday and sat only.   insulin glargine (LANTUS SOLOSTAR) 100 UNIT/ML Solostar Pen Inject 20 Units into the skin at bedtime.   Insulin Pen Needle (ASSURE ID SAFETY PEN NEEDLES) 30G X 5 MM MISC USE AS DIRECTED   metFORMIN (GLUCOPHAGE) 500 MG tablet TAKE 1 TABLET BY MOUTH 2 TIMES DAILY WITH A MEAL.   methocarbamol (ROBAXIN) 500 MG tablet Take 1 tablet (500 mg total) by mouth every 6 (six) hours as needed for muscle spasms.   montelukast (SINGULAIR) 10 MG tablet TAKE 1 TABLET BY MOUTH EVERY DAY   Multiple Vitamin (MULTIVITAMIN WITH MINERALS) TABS tablet Take 1 tablet by mouth in the morning.    omeprazole (PRILOSEC) 40 MG capsule TAKE 1 CAPSULE (40 MG TOTAL) BY MOUTH DAILY.   ondansetron (ZOFRAN) 4 MG tablet Take 1 tablet (4 mg total) by mouth every 6 (six) hours as needed for nausea.   oxyCODONE (OXY IR/ROXICODONE) 5 MG immediate release tablet Take 5 mg by mouth every 4 (four) hours as needed for severe pain (pain score 7-10) (after therapy).   pregabalin (LYRICA) 100 MG capsule Take 1 capsule by mouth twice daily   rOPINIRole (REQUIP) 3 MG tablet TAKE 1 TABLET BY MOUTH AT BEDTIME.   rosuvastatin (CRESTOR) 20 MG tablet Take 1 tablet (20 mg total) by mouth daily.   sacubitril-valsartan (ENTRESTO) 49-51 MG Take 1 tablet by mouth 2 (two) times daily.   spironolactone (ALDACTONE) 25 MG tablet Take 0.5 tablets (12.5 mg total) by mouth daily.   sulfamethoxazole-trimethoprim (BACTRIM DS) 800-160 MG tablet Take 1 tablet by mouth 2 (two) times daily.   traMADol (ULTRAM) 50 MG tablet Take 1-2 tablets (50-100 mg total) by mouth every 6 (six) hours as needed for moderate pain (pain score 4-6).   No current facility-administered medications on file prior to visit.  There are no discontinued medications.   Objective   Physical Exam     06/20/2023    9:24 AM 06/05/2023    4:18 PM 06/05/2023    4:00 PM  Vitals with BMI  Height 5\' 4"     Weight 187 lbs 8 oz    BMI 32.17    Systolic 138 180 409  Diastolic 76 65 62  Pulse 62  62   Wt Readings from Last 10 Encounters:  06/20/23 187 lb 8 oz (85 kg)  06/05/23 190 lb (86.2 kg)  06/01/23 190 lb (86.2 kg)  05/26/23 190 lb 9.6 oz (86.5 kg)  05/11/23 191 lb (86.6 kg)  05/09/23 193 lb (87.5 kg)  04/24/23 193 lb (87.5 kg)  04/13/23 193 lb (87.5 kg)  01/06/23 196 lb 6.4 oz (89.1 kg)  10/04/22 199 lb 3.2 oz (90.4 kg)   Vital signs reviewed.  Nursing notes reviewed. Weight trend reviewed. Abnormalities and Problem-Specific physical exam findings:   PHYSICAL EXAMINATION:   General: 77 year old female in mild distress due to vertigo, requiring  assistance for ambulation  HEENT:  - Sinus tenderness across bridge of nose - TMs clear bilaterally, no nystagmus in neutral position  Neurological: 1. Cranial Nerves:    - EOMI with saccadic (jerky) pursuit in horizontal planes bilaterally    - No vertical gaze abnormalities    - No spontaneous nystagmus in primary gaze  2. HINTS Examination:    - Head Impulse Test: Positive with catch-up saccades bilaterally    - Direction-changing horizontal nystagmus with lateral gaze    - Test of Skew: Negative (no vertical misalignment on alternate cover test)  3. Vestibular Testing:    - Positive Dix-Hallpike with right-beating nystagmus    - Multiple Epley maneuvers performed with partial improvement  4. Motor/Sensory:    - Normal tone and strength in all extremities    - Known peripheral neuropathy in lower extremities    - Significant balance impairment requiring assistance  Cervical Spine: - Neck stiffness present can hyperextend neck for epley - Full ROM with mild discomfort - No meningeal signs  Gait: Requires assistance for ambulation Wide-based, unsteady Limited by vertigo and recent TKA   Physical Exam   General Appearance:  No acute distress appreciable.   Well-groomed, healthy-appearing female.  Well proportioned with no abnormal fat distribution.  Good muscle tone. Pulmonary:  Normal work of breathing at rest, no respiratory distress apparent. SpO2: 97 %  Musculoskeletal: All extremities are intact.  Neurological:  Awake, alert, oriented, and engaged.  No obvious focal neurological deficits or cognitive impairments.  Sensorium seems unclouded.   Speech is clear and coherent with logical content. No slurring, sharp wit. Psychiatric:  Appropriate mood, pleasant and cooperative demeanor, thoughtful and engaged during the exam  Review of Systems  Constitutional:  Negative for chills, diaphoresis, fever, malaise/fatigue and weight loss.  HENT:  Positive for congestion and  sinus pain. Negative for ear discharge, ear pain, hearing loss, sore throat and tinnitus.   Eyes:  Positive for double vision (has been going on a while maybe 6 months). Negative for blurred vision, photophobia, pain, discharge and redness.  Respiratory:  Negative for cough, hemoptysis, sputum production and shortness of breath.   Cardiovascular:  Negative for chest pain, palpitations, orthopnea and claudication.  Gastrointestinal:  Positive for nausea and vomiting. Negative for abdominal pain, blood in stool, constipation, diarrhea, heartburn and melena.  Genitourinary:  Negative for dysuria, frequency, hematuria and urgency.  Musculoskeletal:  Positive for neck pain. Negative for back pain and myalgias.  Skin:  Negative for itching and rash.  Neurological:  Positive for dizziness and headaches. Negative for tingling, tremors, sensory change, speech change, focal weakness and weakness.  Endo/Heme/Allergies:  Negative for environmental allergies. Does not bruise/bleed easily.  Psychiatric/Behavioral:  Negative for depression, hallucinations, substance abuse and suicidal ideas.       Results            No results found for any visits on 06/20/23.  Admission on 06/05/2023, Discharged on 06/05/2023  Component Date Value   Glucose-Capillary 06/05/2023 72    Glucose-Capillary 06/05/2023 76   Admission on 04/24/2023, Discharged on 04/25/2023  Component Date Value   Glucose-Capillary  04/24/2023 106 (H)    Comment 1 04/24/2023 Notify RN    Comment 2 04/24/2023 Document in Chart    Glucose-Capillary 04/24/2023 80    Glucose-Capillary 04/24/2023 82    Glucose-Capillary 04/24/2023 95    Glucose-Capillary 04/24/2023 219 (H)    WBC 04/25/2023 10.6 (H)    RBC 04/25/2023 3.85 (L)    Hemoglobin 04/25/2023 10.7 (L)    HCT 04/25/2023 34.8 (L)    MCV 04/25/2023 90.4    MCH 04/25/2023 27.8    MCHC 04/25/2023 30.7    RDW 04/25/2023 15.1    Platelets 04/25/2023 229    nRBC 04/25/2023 0.0     Sodium 04/25/2023 133 (L)    Potassium 04/25/2023 5.1    Chloride 04/25/2023 103    CO2 04/25/2023 23    Glucose, Bld 04/25/2023 291 (H)    BUN 04/25/2023 28 (H)    Creatinine, Ser 04/25/2023 1.12 (H)    Calcium 04/25/2023 8.5 (L)    GFR, Estimated 04/25/2023 51 (L)    Anion gap 04/25/2023 7    Glucose-Capillary 04/24/2023 307 (H)    Glucose-Capillary 04/25/2023 227 (H)    Glucose-Capillary 04/25/2023 199 (H)   Hospital Outpatient Visit on 04/13/2023  Component Date Value   MRSA, PCR 04/13/2023 NEGATIVE    Staphylococcus aureus 04/13/2023 NEGATIVE    Hgb A1c MFr Bld 04/13/2023 7.7 (H)    Mean Plasma Glucose 04/13/2023 174    Sodium 04/13/2023 139    Potassium 04/13/2023 4.4    Chloride 04/13/2023 107    CO2 04/13/2023 23    Glucose, Bld 04/13/2023 110 (H)    BUN 04/13/2023 28 (H)    Creatinine, Ser 04/13/2023 1.33 (H)    Calcium 04/13/2023 9.5    GFR, Estimated 04/13/2023 41 (L)    Anion gap 04/13/2023 9    WBC 04/13/2023 6.5    RBC 04/13/2023 4.16    Hemoglobin 04/13/2023 11.5 (L)    HCT 04/13/2023 36.7    MCV 04/13/2023 88.2    MCH 04/13/2023 27.6    MCHC 04/13/2023 31.3    RDW 04/13/2023 15.5    Platelets 04/13/2023 248    nRBC 04/13/2023 0.0    Glucose-Capillary 04/13/2023 118 (H)   Office Visit on 01/06/2023  Component Date Value   Hemoglobin A1C 01/06/2023 6.6 (A)   Abstract on 10/04/2022  Component Date Value   HM Diabetic Eye Exam 08/04/2022 No Retinopathy   Office Visit on 10/04/2022  Component Date Value   WBC 10/04/2022 5.7    RBC 10/04/2022 4.36    Hemoglobin 10/04/2022 11.9 (L)    HCT 10/04/2022 37.6    MCV 10/04/2022 86.3    MCHC 10/04/2022 31.7    RDW 10/04/2022 14.7    Platelets 10/04/2022 283.0    Neutrophils Relative % 10/04/2022 53.3    Lymphocytes Relative 10/04/2022 31.3    Monocytes Relative 10/04/2022 7.9    Eosinophils Relative 10/04/2022 6.6 (H)    Basophils Relative 10/04/2022 0.9    Neutro Abs 10/04/2022 3.0    Lymphs Abs  10/04/2022 1.8    Monocytes Absolute 10/04/2022 0.4    Eosinophils Absolute 10/04/2022 0.4    Basophils Absolute 10/04/2022 0.0    Sodium 10/04/2022 139    Potassium 10/04/2022 4.4    Chloride 10/04/2022 103    CO2 10/04/2022 27    Glucose, Bld 10/04/2022 111 (H)    BUN 10/04/2022 24 (H)    Creatinine, Ser 10/04/2022 1.22 (H)    Total Bilirubin 10/04/2022 0.4  Alkaline Phosphatase 10/04/2022 99    AST 10/04/2022 19    ALT 10/04/2022 14    Total Protein 10/04/2022 6.8    Albumin 10/04/2022 3.7    GFR 10/04/2022 43.03 (L)    Calcium 10/04/2022 9.8    Hgb A1c MFr Bld 10/04/2022 7.5 (H)    Cholesterol 10/04/2022 109    Triglycerides 10/04/2022 102.0    HDL 10/04/2022 57.90    VLDL 10/04/2022 20.4    LDL Cholesterol 10/04/2022 31    Total CHOL/HDL Ratio 10/04/2022 2    NonHDL 10/04/2022 50.95    TSH 10/04/2022 1.87    Uric Acid, Serum 10/04/2022 6.3    Microalb, Ur 10/04/2022 1.8    Creatinine,U 10/04/2022 104.6    Microalb Creat Ratio 10/04/2022 1.7   Admission on 08/15/2022, Discharged on 08/15/2022  Component Date Value   Sodium 08/09/2022 138    Potassium 08/09/2022 4.6    Chloride 08/09/2022 104    CO2 08/09/2022 23    Glucose, Bld 08/09/2022 60 (L)    BUN 08/09/2022 23    Creatinine, Ser 08/09/2022 1.23 (H)    Calcium 08/09/2022 9.2    GFR, Estimated 08/09/2022 46 (L)    Anion gap 08/09/2022 11    Glucose-Capillary 08/15/2022 120 (H)    SURGICAL PATHOLOGY 08/15/2022                     Value:SURGICAL PATHOLOGY CASE: [id number] PATIENT: Monia Syverson Surgical Pathology Report     Clinical History: bilateral turbinate hypertrophy, maxillary, frontal, ethmoid and sphenoid sinusitis (cm)     FINAL MICROSCOPIC DIAGNOSIS:  A. SINUS CONTENTS: - Benign sinonasal mucosa with acute and chronic inflammation - No malignancy identified   GROSS DESCRIPTION:  Received in formalin are fragments of soft tan-pink tissue, bloody mucus and fragments of friable  pale green material.  The specimen has an aggregate measurement of 3.5 x 3.5 x 1 cm.  Sections are submitted in 1 cassette.  [facility] 08/15/2022)   Final Diagnosis performed by Clifton James, MD.   Electronically signed 08/16/2022 Technical component performed at [facility], [address], [place], [place] [address].  Professional component performed at [facility], [address], [place], [place] [address].  Immunohistochemistry Technical component (if applicable) w                         as performed at [facility]. [address], [place], [place] [address].   IMMUNOHISTOCHEMISTRY DISCLAIMER (if applicable): Some of these immunohistochemical stains may have been developed and the performance characteristics determine by [facility]. Some may not have been cleared or approved by the U.S. Food and Drug Administration. The FDA has determined that such clearance or approval is not necessary. This test is used for clinical purposes. It should not be regarded as investigational or for research. This laboratory is certified under the Clinical Laboratory Improvement Amendments of 1988 (CLIA-88) as qualified to perform high complexity clinical laboratory testing.  The controls stained appropriately.    Glucose-Capillary 08/15/2022 121 (H)    No image results found.   No results found.  No results found.   MEDICAL DECISION MAKING DOCUMENTATION Level: High Complexity [phone])  1. Number and Complexity of Problems Addressed - HIGH    Primary Problem: Acute vertigo with threat to life/bodily function    Supporting Elements:    - 77 year old with multiple high-risk comorbidities:      *  CHF with reduced EF (HCC)      * Type 2 DM with complications (HCC)      * Stage 3a CKD (HCC)      * Recent knee arthroplasty with arthrofibrosis (HCC)    - Severe symptoms requiring parenteral intervention    - High fall risk  requiring assistance    - Atypical neurological findings suggesting possible central etiology    - Recent post-operative status complicating evaluation  2. Amount and Complexity of Data Reviewed/Analyzed - EXTENSIVE    Category 1 (3+ elements):    - Independent interpretation of HINTS examination revealing:      * Bilateral catch-up saccades      * Saccadic pursuit      * Direction-changing horizontal nystagmus    - Independent visualization of vestibular response during Epley    - Direct observation of neurological status    Category 2:    - Independent interpretation of vestibular testing    Category 3:    - Discussion of management requiring coordination with PCP for urgent follow-up  3. Risk of Complications and/or Morbidity/Mortality - HIGH    Severity of Problems:    - Acute condition potentially threatening bodily function    - Multiple serious comorbidities affecting management    - Post-operative status increasing risk        Management Decisions:    - Emergency precautions and monitoring required    - Parenteral medication administration    - Complex treatment decisions including:      * Timing of diagnostic testing      * Level of care determination      * Need for specialist referral        Risk Factors:    - Age 32    - Multiple HCC conditions    - Recent surgery    - Fall risk    - Abnormal neurological findings  ADDITIONAL TIME DOCUMENTATION: Total time spent: 75 minutes - Direct patient care - Chart review - Documentation - Care coordination - Phone follow-up (Excluding 20 minutes for separately billable Epley procedure)  RECOMMENDATIONS FOR BILLING: 1. [id number] based on high complexity MDM 2. 95992 for Epley procedure 3. 99417 x1 for extended time (55 minutes total E/M time) 4. Medication administration codes

## 2023-06-20 NOTE — Patient Instructions (Addendum)
AFTER VISIT SUMMARY  Your Visit Today: June 20, 2023  Primary Concerns Addressed: - Severe dizziness (vertigo) - Nausea and vomiting - Balance problems - Sinus congestion  What We Found: You have a type of vertigo that may be caused by inner ear crystals moving out of place. However, because of your other medical conditions and examination findings, we need to be extra careful in monitoring your symptoms. Your sinus infection is likely contributing to your headache and discomfort.  Treatments Provided Today: 1. Special positioning treatment (Epley maneuver) to help with vertigo 2. Nausea medication injections:    - Zofran injection at 10:11 AM    - Phenergan injection at 10:16 AM  New Medications: 1. Meclizine (for dizziness)    - Take as prescribed for vertigo symptoms 2. Antibiotic    - Take as prescribed for sinus infection    - Complete entire course  Continue Your Regular Medications As Prescribed  Home Care Instructions: 1. Safety First:    - Use a walker or cane for walking    - Have someone with you when walking    - Remove throw rugs and clear walking paths    - Keep lights on at night  2. For Vertigo:    - Avoid sudden head movements    - Move slowly when changing positions    - Sleep with head slightly elevated    - You may try the repositioning maneuver we practiced today if symptoms return  3. For Sinus Infection:    - Use saline nasal rinses as recommended    - Stay well hydrated    - Complete all antibiotics as prescribed  Follow-up Plan: 1. Call your regular doctor (PCP) tomorrow morning for an appointment 2. If you cannot see your regular doctor, call our office for follow-up within 48 hours 3. Keep your regular appointments with your knee surgery doctor  Go to the Emergency Room Immediately if You Have: - Severe dizziness that won't stop - New or severe headache - Difficulty speaking or understanding speech - New vision problems or double  vision - Weakness or numbness - Falls - Chest pain or shortness of breath - Confusion - Fever  Your Progress: You reported 80% improvement in your symptoms during our phone check today. While this is encouraging, please still follow up as planned to ensure continued improvement.  Special Instructions: - Keep track of when dizziness occurs - Note any patterns with head movement - Use your walker/cane until cleared by your doctor - Take medications exactly as prescribed - Stay hydrated - Rest as needed but try to remain as active as safely possible  Questions or Concerns? - During office hours: Call [phone] - After hours/Emergencies: Go to nearest Emergency Room  Next Steps: ? Call PCP tomorrow morning ? Take new medications as prescribed ? Follow safety precautions ? Use walker/cane for all walking ? Keep follow-up appointment ? Report any new or worsening symptoms immediately  We are here to help you feel better. Don't hesitate to contact us with any concerns.

## 2023-06-22 ENCOUNTER — Ambulatory Visit: Payer: Medicare Other | Admitting: *Deleted

## 2023-06-22 ENCOUNTER — Encounter: Payer: Self-pay | Admitting: *Deleted

## 2023-06-22 DIAGNOSIS — M25562 Pain in left knee: Secondary | ICD-10-CM

## 2023-06-22 DIAGNOSIS — M25662 Stiffness of left knee, not elsewhere classified: Secondary | ICD-10-CM

## 2023-06-22 DIAGNOSIS — R600 Localized edema: Secondary | ICD-10-CM

## 2023-06-22 NOTE — Therapy (Signed)
OUTPATIENT PHYSICAL THERAPY LOWER EXTREMITY TREATMENT   Patient Name: [name] MRN: [id number] DOB:12-27-1945, 77 y.o., female Today's Date: 06/22/2023  END OF SESSION:  PT End of Session - 06/22/23 0945     Visit Number 15    Number of Visits 18    Date for PT Re-Evaluation 07/28/23                    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) 08/24/2020   Diabetes mellitus without complication (HCC)    takes Trulicity,Jardiance,and Metformin daily.Average fasting blood sugar runs around130   Dysrhythmia    PVCs, has MVP   Fibromyalgia    GERD (gastroesophageal reflux disease)    takes Omeprazole daily   Gout 07/19/2021   On allopurinol   History of bronchitis > 8 yrs ago   History of shingles    HTN (hypertension)    takes Amlodipine and Micardis daily   Hx of colonic polyps    benign   Internal and external hemorrhoids without complication    Joint pain    Mitral valve prolapse    Neuromuscular disorder (HCC)    restless leg syndrome   Nocturia    Pneumonia    PONV (postoperative nausea and vomiting)    when gets injections in joints gets hives.Betadine rash   Restless leg syndrome    takes Requip at bedtime   Seasonal allergies    takes Claritin daily as needed   Urinary frequency    Uterine fibroid    Past Surgical History:  Procedure Laterality Date   BUNIONECTOMY Bilateral    COLONOSCOPY  07/12/2004   diverticulosis, internal and external hemorrhoids   ETHMOIDECTOMY Bilateral 08/15/2022   Procedure: ETHMOIDECTOMY WITH TISSUE REMOVAL;  Surgeon: Newman Pies, MD;  Location: [facility];  Service: ENT;  Laterality: Bilateral;   FINGER ARTHROSCOPY WITH CARPOMETACARPEL (CMC) ARTHROPLASTY Right 09/28/2015   Procedure: RIGHT  THUMB TRAPEZIUM EXCISION WITH CARPOMETACARPEL (CMC) ARTHROPLASTY AND TENDON TRANSFER;  Surgeon: Bradly Bienenstock, MD;  Location: [facility];  Service: Orthopedics;  Laterality: Right;   FRONTAL SINUS EXPLORATION Bilateral 08/15/2022   Procedure: FRONTAL RECESS SINUS EXPLORATION WITH NAVIGATION;  Surgeon: Newman Pies, MD;  Location: [facility];  Service: ENT;  Laterality: Bilateral;   HEMORRHOID SURGERY     almost 40 yrs ago   HYSTEROSCOPY WITH D & C  08/23/2000   and resectoscopic myomectomy   KNEE CLOSED REDUCTION Left 06/05/2023   Procedure: CLOSED MANIPULATION KNEE;  Surgeon: Ollen Gross, MD;  Location: [facility];  Service: Orthopedics;  Laterality: Left;   LUMBAR DISC SURGERY  03/16/2005   LUMBAR EPIDURAL INJECTION     MAXILLARY ANTROSTOMY Bilateral 08/15/2022   Procedure: ENDOSCOPIC MAXILLARY ANTROSTOMY WITH TISSUE REMOVAL;  Surgeon: Newman Pies, MD;  Location: [facility];  Service: ENT;  Laterality: Bilateral;   PLANTAR FASCIA RELEASE Left 02/03/2010   and torn tendon   REVERSE SHOULDER ARTHROPLASTY Right 12/14/2020   Procedure: REVERSE SHOULDER ARTHROPLASTY;  Surgeon: Beverely Low, MD;  Location: [facility];  Service: Orthopedics;  Laterality: Right;   RIGHT/LEFT HEART CATH AND CORONARY ANGIOGRAPHY N/A 09/07/2020   Procedure: RIGHT/LEFT HEART CATH AND CORONARY ANGIOGRAPHY;  Surgeon: Yvonne Kendall, MD;  Location: [facility];  Service: Cardiovascular;  Laterality: N/A;   SINUS ENDO W/FUSION Bilateral 08/15/2022   Procedure: SPHENOIDECTOMY WITH TISSUE REMOVAL WITH NAVIGATION;  Surgeon: Newman Pies, MD;  Location: [facility];  Service: ENT;  Laterality: Bilateral;   TENDON TRANSFER Right 09/28/2015   Procedure: TENDON TRANSFER;  Surgeon: Bradly Bienenstock, MD;  Location: [facility];  Service: Orthopedics;  Laterality: Right;   TONSILLECTOMY     TOTAL KNEE ARTHROPLASTY Left 04/24/2023   Procedure: TOTAL KNEE ARTHROPLASTY;  Surgeon: Ollen Gross, MD;  Location: [facility];  Service: Orthopedics;  Laterality: Left;   TUBAL LIGATION     TURBINATE REDUCTION Bilateral 08/15/2022   Procedure: TURBINATE REDUCTION;  Surgeon: Newman Pies, MD;  Location: [facility];  Service: ENT;  Laterality: Bilateral;   WRIST SURGERY     left, removal of cyst   Patient Active Problem List   Diagnosis Date Noted   Vertigo 06/20/2023   Arthrofibrosis of total knee replacement (HCC) 06/05/2023   History of total knee arthroplasty 2024, Dr. 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   Laryngospasms 04/21/2022   Muscle tension dysphonia 04/21/2022   Old tear of meniscus of left knee 09/28/2021   Combined hyperlipidemia associated with type 2 diabetes mellitus (HCC) 09/28/2021   Gout 07/19/2021   S/P shoulder replacement, right 12/14/2020   Rotator cuff tear arthropathy 10/22/2020   Nonischemic cardiomyopathy (HCC) 10/06/2020   Congestive heart failure with left ventricular systolic dysfunction (HCC) 08/24/2020   Primary osteoarthritis of left shoulder 07/29/2020   DDD (degenerative disc disease), cervical 04/30/2020   Osteoarthritis of left knee 11/05/2018   Chronic pansinusitis 10/04/2017   Chronic low back pain 09/05/2017   Degenerative lumbar disc 06/04/2015   S/P lumbar laminectomy 06/04/2015   Restless leg syndrome 05/13/2014   Type 2 diabetes mellitus with peripheral neuropathy (HCC) 04/09/2013   Spinal stenosis of lumbar region without neurogenic claudication 09/12/2012   GERD (gastroesophageal reflux disease) 05/25/2011   Hemorrhoids, internal, with bleeding 05/25/2011   Hypertension associated with diabetes (HCC) 11/08/2010   Chronic allergic rhinitis 08/19/2010   Mitral valve prolapse 10/10/2008   REFERRING PROVIDER: Eartha Inch, PA   REFERRING DIAG: Unilateral primary osteoarthritis, left knee   THERAPY DIAG:  Acute pain of left  knee  Stiffness of left knee, not elsewhere classified  Localized edema  Rationale for Evaluation and Treatment: Rehabilitation  ONSET DATE: 04/24/23 (TKA) 06/05/23 (manipulation)  SUBJECTIVE:   SUBJECTIVE STATEMENT:  Patient reports 3/10 left knee pain today.  Canceled Tuesday due to inner ear problems  PERTINENT HISTORY: Allergies, hypertension, diabetes, congestive heart failure, osteoarthritis, chronic low back pain, chronic kidney disease, anxiety, claustrophobia, and fibromyalgia PAIN:  Are you having pain? Yes: NPRS scale: 3/10 Pain location: left knee Pain description: constant aching, throbbing, and burning  Aggravating factors: standing, walking, and moving her knee  Relieving factors: ice and medication   PRECAUTIONS: None  RED FLAGS: None   WEIGHT BEARING RESTRICTIONS: No  FALLS:  Has patient fallen in last 6 months? No  LIVING ENVIRONMENT: Lives with: lives with their family Lives in: House/apartment Stairs: Yes: Internal: 14 steps; on right going up and External: 1 steps; none Has following equipment at home: Dan Humphreys - 2 wheeled  OCCUPATION: retired  PLOF: Independent  PATIENT GOALS: be able to paint the last two rooms of her house, be able to do her yard work, reduced  pain, and improved mobility  NEXT MD VISIT: 06/20/23  OBJECTIVE: all objective measures were assessed at her initial evaluation on 04/27/23 unless otherwise noted  PATIENT SURVEYS:  FOTO 50.16 on 05/29/23  COGNITION: Overall cognitive status: Within functional limits for tasks assessed     SENSATION: Patient reports no numbness or tingling  EDEMA:  Circumferential: L tibiofemoral joint line: 53.3 cn R tibiofemoral joint line: 49.0 cm  POSTURE: forward head and flexed trunk   PALPATION: TTP: left quadriceps, hip adductors, hamstrings, medial and lateral tibiofemoral joint lines  LOWER EXTREMITY ROM:  Active ROM Right eval Left eval 05/09/23 Left 05/29/23 Left 06/06/23  Hip  flexion       Hip extension       Hip abduction       Hip adduction       Hip internal rotation       Hip external rotation       Knee flexion 133 39/ 43 (PROM) 68 degrees(passive) 61/ 75 (PROM)  73/ 69 (PROM; limited by pain)   Knee extension 0 20 -14 degrees (passive). 13 13  Ankle dorsiflexion       Ankle plantarflexion       Ankle inversion       Ankle eversion        (Blank rows = not tested)  LOWER EXTREMITY MMT: not assessed due to surgical condition  LOWER EXTREMITY SPECIAL TESTS:  Not tested due to surgical condition  GAIT: Assistive device utilized: Environmental consultant - 2 wheeled and Wheelchair (manual) Level of assistance: Modified independence and Total A Comments: Patient utilized a shuffling pattern with the FWW, but utilized for long distance mobility    TODAY'S TREATMENT:                                                                                                                              DATE:                                    06/22/23 EXERCISE LOG    LT knee  Exercise Repetitions and Resistance Comments  Nustep L3 x 15 minutes; seat 8-7   Lunges onto step  10" step x 3 minutes  LLE on step for knee flexion  Rocker board  5 minutes    Step up  6" step x 20 reps Leading with LLE   LAQ 3# x 2.5 mins LLE only   Seated Marches  BUE support   Seated Hip Abduction Red x 25 reps   Seated HS Curls Red x 25 reps    Blank cell = exercise not performed today  Manual: PROM extension and flexion 15-72 degrees today Vaso: Knee, 34 degrees; low pressure, 15 mins, Pain and Tone  06/06/23 EXERCISE LOG  Exercise Repetitions and Resistance Comments  Nustep  L1 x 18 minutes; seat 11- 9   Gastroc stretch  2.5 minutes    Lunges onto a step  6" step x 3.5 minutes  LLE on step for knee flexion  LAQ 2 minutes  LLE only        Blank cell = exercise not performed today  Manual Therapy Soft Tissue Mobilization: left hamstring and quadriceps, for  improved soft tissue extensibility Joint Mobilizations: left patella and tibiofemoral, grade I-IV Passive ROM: flexion and extension, to tolerance   Modalities: no adverse reaction to today's modalities  Date:  Vaso: Knee, 34 degrees; low pressure, 15 mins, Pain and Tone                                    PATIENT EDUCATION:  Education details: HEP, plan of care, prognosis, healing, and goals for therapy Person educated: Patient Education method: Explanation Education comprehension: verbalized understanding  HOME EXERCISE PROGRAM: [id number]  ASSESSMENT:  CLINICAL IMPRESSION:  Pt arrives for today's treatment session reporting 3/10 left knee pain. Rx focused on progressing ROM flexion and extension as well as strengthening for LT knee. PROM extension and flexion 15-72 degrees today. Vaso end of session.    OBJECTIVE IMPAIRMENTS: Abnormal gait, decreased activity tolerance, decreased mobility, difficulty walking, decreased ROM, decreased strength, hypomobility, increased edema, impaired tone, and pain.   ACTIVITY LIMITATIONS: carrying, lifting, sitting, standing, squatting, stairs, transfers, bed mobility, bathing, dressing, locomotion level, and caring for others  PARTICIPATION LIMITATIONS: meal prep, cleaning, laundry, driving, shopping, community activity, and yard work  PERSONAL FACTORS: Past/current experiences, Transportation, and 3+ comorbidities: Allergies, hypertension, diabetes, congestive heart failure, osteoarthritis, chronic low back pain, chronic kidney disease, anxiety, claustrophobia, and fibromyalgia  are also affecting patient's functional outcome.   REHAB POTENTIAL: Good  CLINICAL DECISION MAKING: Evolving/moderate complexity  EVALUATION COMPLEXITY: Moderate   GOALS: Goals reviewed with patient? Yes  SHORT TERM GOALS: Target date: 05/18/23 Patient will be independent with her initial HEP. Baseline: Goal status: MET  2.  Patient will be able to  demonstrate active left knee extension within 10 degrees of neutral for improved knee mobility.  Baseline:  Goal status: ON GOING  3.  Patient will be able to demonstrate active left knee flexion to at least 90 degrees for improved function with transfers. Baseline:  Goal status: ON GOING  4.  Patient will be able to safely ambulate community distances with a walker with the least restrictive assistive device for improved community mobility. Baseline: utilizes a cane for mobility Goal status: MET  LONG TERM GOALS: Target date: 06/08/23  Patient will be independent with her advanced HEP. Baseline:  Goal status: ON GOING  2.  Patient will be able to demonstrate active left knee extension within 5 degrees of neutral for improved gait mechanics. Baseline:  Goal status: ON GOING   3.  Patient will improve her active left knee flexion to at least 120 degrees for improved function navigating stairs. Baseline:  Goal status: ON GOING  4.  Patient will be able to safely ambulate with minimal to no gait deviations for improved mobility. Baseline:  Goal status: ON GOING  5.  Patient will be able to transfer from sitting to standing with minimal to no difficulty for improved household mobility. Baseline:  Goal status: MET  PLAN:  PT FREQUENCY: 2-3x/week  PT DURATION: 6 weeks  PLANNED INTERVENTIONS: Therapeutic exercises, Therapeutic activity, Neuromuscular re-education, Balance training, Gait training, Patient/Family education, Self Care, Joint mobilization, Stair training, Electrical stimulation, Cryotherapy, Moist heat, Vasopneumatic device, Manual therapy, and Re-evaluation  PLAN FOR NEXT SESSION: Nustep, review and update HEP as needed, manual therapy, gait training, and modalities as needed   Aaryav Hopfensperger,CHRIS, PTA 06/22/2023, 9:48 AM

## 2023-06-26 ENCOUNTER — Ambulatory Visit: Payer: Medicare Other

## 2023-06-26 DIAGNOSIS — M25562 Pain in left knee: Secondary | ICD-10-CM | POA: Diagnosis not present

## 2023-06-26 DIAGNOSIS — R600 Localized edema: Secondary | ICD-10-CM

## 2023-06-26 DIAGNOSIS — M25662 Stiffness of left knee, not elsewhere classified: Secondary | ICD-10-CM

## 2023-06-26 NOTE — Therapy (Signed)
OUTPATIENT PHYSICAL THERAPY LOWER EXTREMITY TREATMENT   Patient Name: [name] MRN: [id number] DOB:09-05-1945, 77 y.o., female Today's Date: 06/26/2023  END OF SESSION:  PT End of Session - 06/26/23 1348     Visit Number 16    Number of Visits 18    Date for PT Re-Evaluation 07/28/23    PT Start Time 1345    PT Stop Time 1430    PT Time Calculation (min) 45 min                    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) 08/24/2020   Diabetes mellitus without complication (HCC)    takes Trulicity,Jardiance,and Metformin daily.Average fasting blood sugar runs around130   Dysrhythmia    PVCs, has MVP   Fibromyalgia    GERD (gastroesophageal reflux disease)    takes Omeprazole daily   Gout 07/19/2021   On allopurinol   History of bronchitis > 8 yrs ago   History of shingles    HTN (hypertension)    takes Amlodipine and Micardis daily   Hx of colonic polyps    benign   Internal and external hemorrhoids without complication    Joint pain    Mitral valve prolapse    Neuromuscular disorder (HCC)    restless leg syndrome   Nocturia    Pneumonia    PONV (postoperative nausea and vomiting)    when gets injections in joints gets hives.Betadine rash   Restless leg syndrome    takes Requip at bedtime   Seasonal allergies    takes Claritin daily as needed   Urinary frequency    Uterine fibroid    Past Surgical History:  Procedure Laterality Date   BUNIONECTOMY Bilateral    COLONOSCOPY  07/12/2004   diverticulosis, internal and external hemorrhoids   ETHMOIDECTOMY Bilateral 08/15/2022   Procedure: ETHMOIDECTOMY WITH TISSUE REMOVAL;  Surgeon: Newman Pies, MD;  Location: [facility];  Service: ENT;  Laterality: Bilateral;   FINGER  ARTHROSCOPY WITH CARPOMETACARPEL (CMC) ARTHROPLASTY Right 09/28/2015   Procedure: RIGHT THUMB TRAPEZIUM EXCISION WITH CARPOMETACARPEL (CMC) ARTHROPLASTY AND TENDON TRANSFER;  Surgeon: Bradly Bienenstock, MD;  Location: [facility];  Service: Orthopedics;  Laterality: Right;   FRONTAL SINUS EXPLORATION Bilateral 08/15/2022   Procedure: FRONTAL RECESS SINUS EXPLORATION WITH NAVIGATION;  Surgeon: Newman Pies, MD;  Location: [facility];  Service: ENT;  Laterality: Bilateral;   HEMORRHOID SURGERY     almost 40 yrs ago   HYSTEROSCOPY WITH D & C  08/23/2000   and resectoscopic myomectomy   KNEE CLOSED REDUCTION Left 06/05/2023   Procedure: CLOSED MANIPULATION KNEE;  Surgeon: Ollen Gross, MD;  Location: [facility];  Service: Orthopedics;  Laterality: Left;   LUMBAR DISC SURGERY  03/16/2005   LUMBAR EPIDURAL INJECTION     MAXILLARY ANTROSTOMY Bilateral 08/15/2022   Procedure: ENDOSCOPIC MAXILLARY ANTROSTOMY WITH TISSUE REMOVAL;  Surgeon: Newman Pies, MD;  Location: [facility];  Service: ENT;  Laterality: Bilateral;   PLANTAR FASCIA RELEASE Left 02/03/2010   and torn tendon   REVERSE SHOULDER ARTHROPLASTY Right 12/14/2020   Procedure: REVERSE SHOULDER ARTHROPLASTY;  Surgeon: Beverely Low, MD;  Location: [facility];  Service: Orthopedics;  Laterality: Right;   RIGHT/LEFT HEART CATH  AND CORONARY ANGIOGRAPHY N/A 09/07/2020   Procedure: RIGHT/LEFT HEART CATH AND CORONARY ANGIOGRAPHY;  Surgeon: Yvonne Kendall, MD;  Location: [facility];  Service: Cardiovascular;  Laterality: N/A;   SINUS ENDO W/FUSION Bilateral 08/15/2022   Procedure: SPHENOIDECTOMY WITH TISSUE REMOVAL WITH NAVIGATION;  Surgeon: Newman Pies, MD;  Location: [facility];  Service: ENT;  Laterality: Bilateral;   TENDON TRANSFER Right 09/28/2015   Procedure: TENDON TRANSFER;  Surgeon: Bradly Bienenstock, MD;  Location: [facility];  Service: Orthopedics;  Laterality: Right;   TONSILLECTOMY     TOTAL KNEE ARTHROPLASTY Left  04/24/2023   Procedure: TOTAL KNEE ARTHROPLASTY;  Surgeon: Ollen Gross, MD;  Location: [facility];  Service: Orthopedics;  Laterality: Left;   TUBAL LIGATION     TURBINATE REDUCTION Bilateral 08/15/2022   Procedure: TURBINATE REDUCTION;  Surgeon: Newman Pies, MD;  Location: [facility];  Service: ENT;  Laterality: Bilateral;   WRIST SURGERY     left, removal of cyst   Patient Active Problem List   Diagnosis Date Noted   Vertigo 06/20/2023   Arthrofibrosis of total knee replacement (HCC) 06/05/2023   History of total knee arthroplasty 2024, Dr. 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   Laryngospasms 04/21/2022   Muscle tension dysphonia 04/21/2022   Old tear of meniscus of left knee 09/28/2021   Combined hyperlipidemia associated with type 2 diabetes mellitus (HCC) 09/28/2021   Gout 07/19/2021   S/P shoulder replacement, right 12/14/2020   Rotator cuff tear arthropathy 10/22/2020   Nonischemic cardiomyopathy (HCC) 10/06/2020   Congestive heart failure with left ventricular systolic dysfunction (HCC) 08/24/2020   Primary osteoarthritis of left shoulder 07/29/2020   DDD (degenerative disc disease), cervical 04/30/2020   Osteoarthritis of left knee 11/05/2018   Chronic pansinusitis 10/04/2017   Chronic low back pain 09/05/2017   Degenerative lumbar disc 06/04/2015   S/P lumbar laminectomy 06/04/2015   Restless leg syndrome 05/13/2014   Type 2 diabetes mellitus with peripheral neuropathy (HCC) 04/09/2013   Spinal stenosis of lumbar region without neurogenic claudication 09/12/2012   GERD (gastroesophageal reflux disease) 05/25/2011   Hemorrhoids, internal, with bleeding 05/25/2011   Hypertension associated with diabetes (HCC) 11/08/2010   Chronic allergic rhinitis 08/19/2010   Mitral valve prolapse 10/10/2008   REFERRING PROVIDER: Eartha Inch, PA   REFERRING  DIAG: Unilateral primary osteoarthritis, left knee   THERAPY DIAG:  Acute pain of left knee  Stiffness of left knee, not elsewhere classified  Localized edema  Rationale for Evaluation and Treatment: Rehabilitation  ONSET DATE: 04/24/23 (TKA) 06/05/23 (manipulation)  SUBJECTIVE:   SUBJECTIVE STATEMENT:  Patient reports 3/10 intermittent left knee pain.    PERTINENT HISTORY: Allergies, hypertension, diabetes, congestive heart failure, osteoarthritis, chronic low back pain, chronic kidney disease, anxiety, claustrophobia, and fibromyalgia PAIN:  Are you having pain? Yes: NPRS scale: 3/10 Pain location: left knee Pain description: constant aching, throbbing, and burning  Aggravating factors: standing, walking, and moving her knee  Relieving factors: ice and medication   PRECAUTIONS: None  RED FLAGS: None   WEIGHT BEARING RESTRICTIONS: No  FALLS:  Has patient fallen in last 6 months? No  LIVING ENVIRONMENT: Lives with: lives with their family Lives in: House/apartment Stairs: Yes: Internal: 14 steps; on right going up and External: 1 steps; none Has following equipment at home: Dan Humphreys - 2 wheeled  OCCUPATION: retired  PLOF: Independent  PATIENT GOALS: be able  to paint the last two rooms of her house, be able to do her yard work, reduced pain, and improved mobility  NEXT MD VISIT: 06/20/23  OBJECTIVE: all objective measures were assessed at her initial evaluation on 04/27/23 unless otherwise noted  PATIENT SURVEYS:  FOTO 50.16 on 05/29/23  COGNITION: Overall cognitive status: Within functional limits for tasks assessed     SENSATION: Patient reports no numbness or tingling  EDEMA:  Circumferential: L tibiofemoral joint line: 53.3 cn R tibiofemoral joint line: 49.0 cm  POSTURE: forward head and flexed trunk   PALPATION: TTP: left quadriceps, hip adductors, hamstrings, medial and lateral tibiofemoral joint lines  LOWER EXTREMITY ROM:  Active ROM  Right eval Left eval 05/09/23 Left 05/29/23 Left 06/06/23  Hip flexion       Hip extension       Hip abduction       Hip adduction       Hip internal rotation       Hip external rotation       Knee flexion 133 39/ 43 (PROM) 68 degrees(passive) 61/ 75 (PROM)  73/ 69 (PROM; limited by pain)   Knee extension 0 20 -14 degrees (passive). 13 13  Ankle dorsiflexion       Ankle plantarflexion       Ankle inversion       Ankle eversion        (Blank rows = not tested)  LOWER EXTREMITY MMT: not assessed due to surgical condition  LOWER EXTREMITY SPECIAL TESTS:  Not tested due to surgical condition  GAIT: Assistive device utilized: Environmental consultant - 2 wheeled and Wheelchair (manual) Level of assistance: Modified independence and Total A Comments: Patient utilized a shuffling pattern with the FWW, but utilized for long distance mobility    TODAY'S TREATMENT:                                                                                                                              DATE:                                    06/26/23 EXERCISE LOG    LT knee  Exercise Repetitions and Resistance Comments  Nustep L3 x 15 minutes; seat 8-7   Lunges onto step  12" step x 3 minutes  LLE on step for knee flexion  Rocker board  5 minutes    Step up  6" step x 25 reps Leading with LLE   LAQ 3# x 3 mins LLE only   Seated Marches 3# x 3 mins BUE support   Seated Hip Abduction Red x 30 reps   Seated HS Curls Red x 30 reps    Blank cell = exercise not performed today  Manual: PROM extension and flexion  06/06/23 EXERCISE LOG  Exercise Repetitions and Resistance Comments  Nustep  L1 x 18 minutes; seat 11- 9   Gastroc stretch  2.5 minutes    Lunges onto a step  6" step x 3.5 minutes  LLE on step for knee flexion  LAQ 2 minutes  LLE only        Blank cell = exercise not performed today  Manual Therapy Soft Tissue Mobilization: left hamstring and quadriceps, for improved  soft tissue extensibility Joint Mobilizations: left patella and tibiofemoral, grade I-IV Passive ROM: flexion and extension, to tolerance   Modalities: no adverse reaction to today's modalities  Date:  Vaso: Knee, 34 degrees; low pressure, 15 mins, Pain and Tone                                    PATIENT EDUCATION:  Education details: HEP, plan of care, prognosis, healing, and goals for therapy Person educated: Patient Education method: Explanation Education comprehension: verbalized understanding  HOME EXERCISE PROGRAM: [id number]  ASSESSMENT:  CLINICAL IMPRESSION:  Pt arrives for today's treatment session reporting 3/10 intermittent left knee pain.  Pt able to tolerate increased step height with forward lunge today with verbal cues to push herself to increase ROM.  Pt able to tolerate increased time or reps with all exercises today.  Pt encouraged to continue to work on ROM at home.  Pt denied any change in pain at completion of today's treatment session.  OBJECTIVE IMPAIRMENTS: Abnormal gait, decreased activity tolerance, decreased mobility, difficulty walking, decreased ROM, decreased strength, hypomobility, increased edema, impaired tone, and pain.   ACTIVITY LIMITATIONS: carrying, lifting, sitting, standing, squatting, stairs, transfers, bed mobility, bathing, dressing, locomotion level, and caring for others  PARTICIPATION LIMITATIONS: meal prep, cleaning, laundry, driving, shopping, community activity, and yard work  PERSONAL FACTORS: Past/current experiences, Transportation, and 3+ comorbidities: Allergies, hypertension, diabetes, congestive heart failure, osteoarthritis, chronic low back pain, chronic kidney disease, anxiety, claustrophobia, and fibromyalgia  are also affecting patient's functional outcome.   REHAB POTENTIAL: Good  CLINICAL DECISION MAKING: Evolving/moderate complexity  EVALUATION COMPLEXITY: Moderate   GOALS: Goals reviewed with patient? Yes  SHORT  TERM GOALS: Target date: 05/18/23 Patient will be independent with her initial HEP. Baseline: Goal status: MET  2.  Patient will be able to demonstrate active left knee extension within 10 degrees of neutral for improved knee mobility.  Baseline:  Goal status: ON GOING  3.  Patient will be able to demonstrate active left knee flexion to at least 90 degrees for improved function with transfers. Baseline:  Goal status: ON GOING  4.  Patient will be able to safely ambulate community distances with a walker with the least restrictive assistive device for improved community mobility. Baseline: utilizes a cane for mobility Goal status: MET  LONG TERM GOALS: Target date: 06/08/23  Patient will be independent with her advanced HEP. Baseline:  Goal status: ON GOING  2.  Patient will be able to demonstrate active left knee extension within 5 degrees of neutral for improved gait mechanics. Baseline:  Goal status: ON GOING   3.  Patient will improve her active left knee flexion to at least 120 degrees for improved function navigating stairs. Baseline:  Goal status: ON GOING  4.  Patient will be able to safely ambulate with minimal to no gait deviations for improved mobility. Baseline:  Goal status: ON GOING  5.  Patient will be able to  transfer from sitting to standing with minimal to no difficulty for improved household mobility. Baseline:  Goal status: MET  PLAN:  PT FREQUENCY: 2-3x/week  PT DURATION: 6 weeks  PLANNED INTERVENTIONS: Therapeutic exercises, Therapeutic activity, Neuromuscular re-education, Balance training, Gait training, Patient/Family education, Self Care, Joint mobilization, Stair training, Electrical stimulation, Cryotherapy, Moist heat, Vasopneumatic device, Manual therapy, and Re-evaluation  PLAN FOR NEXT SESSION: Nustep, review and update HEP as needed, manual therapy, gait training, and modalities as needed   Newman Pies, PTA 06/26/2023, 2:47 PM

## 2023-07-04 ENCOUNTER — Ambulatory Visit: Payer: Medicare Other | Attending: Student | Admitting: Physical Therapy

## 2023-07-04 DIAGNOSIS — R600 Localized edema: Secondary | ICD-10-CM | POA: Insufficient documentation

## 2023-07-04 DIAGNOSIS — M25562 Pain in left knee: Secondary | ICD-10-CM | POA: Insufficient documentation

## 2023-07-04 DIAGNOSIS — M25662 Stiffness of left knee, not elsewhere classified: Secondary | ICD-10-CM | POA: Diagnosis present

## 2023-07-04 NOTE — Therapy (Signed)
OUTPATIENT PHYSICAL THERAPY LOWER EXTREMITY TREATMENT   Patient Name: [name] MRN: [id number] DOB:05-26-1946, 77 y.o., female Today's Date: 07/04/2023  END OF SESSION:  PT End of Session - 07/04/23 0945     Visit Number 17    Number of Visits 18    Date for PT Re-Evaluation 07/28/23    PT Start Time 0930    PT Stop Time 1010    PT Time Calculation (min) 40 min    Activity Tolerance Patient tolerated treatment well    Behavior During Therapy [facility] for tasks assessed/performed                    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) 08/24/2020   Diabetes mellitus without complication (HCC)    takes Trulicity,Jardiance,and Metformin daily.Average fasting blood sugar runs around130   Dysrhythmia    PVCs, has MVP   Fibromyalgia    GERD (gastroesophageal reflux disease)    takes Omeprazole daily   Gout 07/19/2021   On allopurinol   History of bronchitis > 8 yrs ago   History of shingles    HTN (hypertension)    takes Amlodipine and Micardis daily   Hx of colonic polyps    benign   Internal and external hemorrhoids without complication    Joint pain    Mitral valve prolapse    Neuromuscular disorder (HCC)    restless leg syndrome   Nocturia    Pneumonia    PONV (postoperative nausea and vomiting)    when gets injections in joints gets hives.Betadine rash   Restless leg syndrome    takes Requip at bedtime   Seasonal allergies    takes Claritin daily as needed   Urinary frequency    Uterine fibroid    Past Surgical History:  Procedure Laterality Date   BUNIONECTOMY Bilateral    COLONOSCOPY  07/12/2004   diverticulosis, internal and external hemorrhoids   ETHMOIDECTOMY Bilateral 08/15/2022   Procedure: ETHMOIDECTOMY WITH TISSUE REMOVAL;   Surgeon: Newman Pies, MD;  Location: [facility];  Service: ENT;  Laterality: Bilateral;   FINGER ARTHROSCOPY WITH CARPOMETACARPEL (CMC) ARTHROPLASTY Right 09/28/2015   Procedure: RIGHT THUMB TRAPEZIUM EXCISION WITH CARPOMETACARPEL (CMC) ARTHROPLASTY AND TENDON TRANSFER;  Surgeon: Bradly Bienenstock, MD;  Location: [facility];  Service: Orthopedics;  Laterality: Right;   FRONTAL SINUS EXPLORATION Bilateral 08/15/2022   Procedure: FRONTAL RECESS SINUS EXPLORATION WITH NAVIGATION;  Surgeon: Newman Pies, MD;  Location: [facility];  Service: ENT;  Laterality: Bilateral;   HEMORRHOID SURGERY     almost 40 yrs ago   HYSTEROSCOPY WITH D & C  08/23/2000   and resectoscopic myomectomy   KNEE CLOSED REDUCTION Left 06/05/2023   Procedure: CLOSED MANIPULATION KNEE;  Surgeon: Ollen Gross, MD;  Location: [facility];  Service: Orthopedics;  Laterality: Left;   LUMBAR DISC SURGERY  03/16/2005   LUMBAR EPIDURAL INJECTION     MAXILLARY ANTROSTOMY Bilateral 08/15/2022   Procedure: ENDOSCOPIC MAXILLARY ANTROSTOMY WITH TISSUE REMOVAL;  Surgeon: Newman Pies, MD;  Location: [facility];  Service: ENT;  Laterality: Bilateral;   PLANTAR FASCIA RELEASE Left 02/03/2010   and torn tendon   REVERSE SHOULDER ARTHROPLASTY Right 12/14/2020   Procedure: REVERSE SHOULDER ARTHROPLASTY;  Surgeon: Beverely Low, MD;  Location: [facility];  Service: Orthopedics;  Laterality: Right;   RIGHT/LEFT HEART CATH AND CORONARY ANGIOGRAPHY N/A 09/07/2020   Procedure: RIGHT/LEFT HEART CATH AND CORONARY ANGIOGRAPHY;  Surgeon: Yvonne Kendall, MD;  Location: [facility];  Service: Cardiovascular;  Laterality: N/A;   SINUS ENDO W/FUSION Bilateral 08/15/2022   Procedure: SPHENOIDECTOMY WITH TISSUE REMOVAL WITH NAVIGATION;  Surgeon: Newman Pies, MD;  Location: [facility];  Service: ENT;  Laterality: Bilateral;   TENDON TRANSFER Right 09/28/2015   Procedure: TENDON TRANSFER;  Surgeon: Bradly Bienenstock, MD;   Location: [facility];  Service: Orthopedics;  Laterality: Right;   TONSILLECTOMY     TOTAL KNEE ARTHROPLASTY Left 04/24/2023   Procedure: TOTAL KNEE ARTHROPLASTY;  Surgeon: Ollen Gross, MD;  Location: [facility];  Service: Orthopedics;  Laterality: Left;   TUBAL LIGATION     TURBINATE REDUCTION Bilateral 08/15/2022   Procedure: TURBINATE REDUCTION;  Surgeon: Newman Pies, MD;  Location: [facility];  Service: ENT;  Laterality: Bilateral;   WRIST SURGERY     left, removal of cyst   Patient Active Problem List   Diagnosis Date Noted   Vertigo 06/20/2023   Arthrofibrosis of total knee replacement (HCC) 06/05/2023   History of total knee arthroplasty 2024, Dr. 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   Laryngospasms 04/21/2022   Muscle tension dysphonia 04/21/2022   Old tear of meniscus of left knee 09/28/2021   Combined hyperlipidemia associated with type 2 diabetes mellitus (HCC) 09/28/2021   Gout 07/19/2021   S/P shoulder replacement, right 12/14/2020   Rotator cuff tear arthropathy 10/22/2020   Nonischemic cardiomyopathy (HCC) 10/06/2020   Congestive heart failure with left ventricular systolic dysfunction (HCC) 08/24/2020   Primary osteoarthritis of left shoulder 07/29/2020   DDD (degenerative disc disease), cervical 04/30/2020   Osteoarthritis of left knee 11/05/2018   Chronic pansinusitis 10/04/2017   Chronic low back pain 09/05/2017   Degenerative lumbar disc 06/04/2015   S/P lumbar laminectomy 06/04/2015   Restless leg syndrome 05/13/2014   Type 2 diabetes mellitus with peripheral neuropathy (HCC) 04/09/2013   Spinal stenosis of lumbar region without neurogenic claudication 09/12/2012   GERD (gastroesophageal reflux disease) 05/25/2011   Hemorrhoids, internal, with bleeding 05/25/2011   Hypertension associated with diabetes (HCC) 11/08/2010   Chronic allergic  rhinitis 08/19/2010   Mitral valve prolapse 10/10/2008   REFERRING PROVIDER: Eartha Inch, PA   REFERRING DIAG: Unilateral primary osteoarthritis, left knee   THERAPY DIAG:  Acute pain of left knee  Stiffness of left knee, not elsewhere classified  Localized edema  Rationale for Evaluation and Treatment: Rehabilitation  ONSET DATE: 04/24/23 (TKA) 06/05/23 (manipulation)  SUBJECTIVE:   SUBJECTIVE STATEMENT:  Patient reports 3/10 intermittent left knee pain.    PERTINENT HISTORY: Allergies, hypertension, diabetes, congestive heart failure, osteoarthritis, chronic low back pain, chronic kidney disease, anxiety, claustrophobia, and fibromyalgia PAIN:  Are you having pain? Yes: NPRS scale: 3/10 Pain location: left knee Pain description: constant aching, throbbing, and burning  Aggravating factors: standing, walking, and moving her knee  Relieving factors: ice and medication   PRECAUTIONS: None  RED FLAGS: None   WEIGHT BEARING RESTRICTIONS: No  FALLS:  Has patient fallen in last 6 months? No  LIVING ENVIRONMENT: Lives with: lives with their family Lives in: House/apartment Stairs: Yes: Internal: 14 steps; on right going up and External: 1 steps; none Has  following equipment at home: Dan Humphreys - 2 wheeled  OCCUPATION: retired  PLOF: Independent  PATIENT GOALS: be able to paint the last two rooms of her house, be able to do her yard work, reduced pain, and improved mobility  NEXT MD VISIT: 06/20/23  OBJECTIVE: all objective measures were assessed at her initial evaluation on 04/27/23 unless otherwise noted  PATIENT SURVEYS:  FOTO 50.16 on 05/29/23  COGNITION: Overall cognitive status: Within functional limits for tasks assessed     SENSATION: Patient reports no numbness or tingling  EDEMA:  Circumferential: L tibiofemoral joint line: 53.3 cn R tibiofemoral joint line: 49.0 cm  POSTURE: forward head and flexed trunk   PALPATION: TTP: left quadriceps,  hip adductors, hamstrings, medial and lateral tibiofemoral joint lines  LOWER EXTREMITY ROM:  Active ROM Right eval Left eval 05/09/23 Left 05/29/23 Left 06/06/23  Hip flexion       Hip extension       Hip abduction       Hip adduction       Hip internal rotation       Hip external rotation       Knee flexion 133 39/ 43 (PROM) 68 degrees(passive) 61/ 75 (PROM)  73/ 69 (PROM; limited by pain)   Knee extension 0 20 -14 degrees (passive). 13 13  Ankle dorsiflexion       Ankle plantarflexion       Ankle inversion       Ankle eversion        (Blank rows = not tested)  LOWER EXTREMITY MMT: not assessed due to surgical condition  LOWER EXTREMITY SPECIAL TESTS:  Not tested due to surgical condition  GAIT: Assistive device utilized: Environmental consultant - 2 wheeled and Wheelchair (manual) Level of assistance: Modified independence and Total A Comments: Patient utilized a shuffling pattern with the FWW, but utilized for long distance mobility    TODAY'S TREATMENT:                                                                                                                              DATE:   07/04/23:                                     EXERCISE LOG  Exercise Repetitions and Resistance Comments  Nustep Level 3 x 16 minutes   LAQ's 4# x 4 minutes   Rockerboard 4 minutes           In supine:  PROM x 9 minutes into left knee flexion and  extension.                                   06/26/23 EXERCISE LOG    LT knee  Exercise Repetitions and Resistance Comments  Nustep L3 x 15 minutes; seat 8-7  Lunges onto step  12" step x 3 minutes  LLE on step for knee flexion  Rocker board  5 minutes    Step up  6" step x 25 reps Leading with LLE   LAQ 3# x 3 mins LLE only   Seated Marches 3# x 3 mins BUE support   Seated Hip Abduction Red x 30 reps   Seated HS Curls Red x 30 reps    Blank cell = exercise not performed today  Manual: PROM extension and flexion                                        PATIENT EDUCATION:  Education details: HEP, plan of care, prognosis, healing, and goals for therapy Person educated: Patient Education method: Explanation Education comprehension: verbalized understanding  HOME EXERCISE PROGRAM: [id number]  ASSESSMENT:  CLINICAL IMPRESSION:  Patient tolerated stretching well but continues to lack left knee flexion and extension.  OBJECTIVE IMPAIRMENTS: Abnormal gait, decreased activity tolerance, decreased mobility, difficulty walking, decreased ROM, decreased strength, hypomobility, increased edema, impaired tone, and pain.   ACTIVITY LIMITATIONS: carrying, lifting, sitting, standing, squatting, stairs, transfers, bed mobility, bathing, dressing, locomotion level, and caring for others  PARTICIPATION LIMITATIONS: meal prep, cleaning, laundry, driving, shopping, community activity, and yard work  PERSONAL FACTORS: Past/current experiences, Transportation, and 3+ comorbidities: Allergies, hypertension, diabetes, congestive heart failure, osteoarthritis, chronic low back pain, chronic kidney disease, anxiety, claustrophobia, and fibromyalgia  are also affecting patient's functional outcome.   REHAB POTENTIAL: Good  CLINICAL DECISION MAKING: Evolving/moderate complexity  EVALUATION COMPLEXITY: Moderate   GOALS: Goals reviewed with patient? Yes  SHORT TERM GOALS: Target date: 05/18/23 Patient will be independent with her initial HEP. Baseline: Goal status: MET  2.  Patient will be able to demonstrate active left knee extension within 10 degrees of neutral for improved knee mobility.  Baseline:  Goal status: ON GOING  3.  Patient will be able to demonstrate active left knee flexion to at least 90 degrees for improved function with transfers. Baseline:  Goal status: ON GOING  4.  Patient will be able to safely ambulate community distances with a walker with the least restrictive assistive device for improved community mobility. Baseline:  utilizes a cane for mobility Goal status: MET  LONG TERM GOALS: Target date: 06/08/23  Patient will be independent with her advanced HEP. Baseline:  Goal status: ON GOING  2.  Patient will be able to demonstrate active left knee extension within 5 degrees of neutral for improved gait mechanics. Baseline:  Goal status: ON GOING   3.  Patient will improve her active left knee flexion to at least 120 degrees for improved function navigating stairs. Baseline:  Goal status: ON GOING  4.  Patient will be able to safely ambulate with minimal to no gait deviations for improved mobility. Baseline:  Goal status: ON GOING  5.  Patient will be able to transfer from sitting to standing with minimal to no difficulty for improved household mobility. Baseline:  Goal status: MET  PLAN:  PT FREQUENCY: 2-3x/week  PT DURATION: 6 weeks  PLANNED INTERVENTIONS: Therapeutic exercises, Therapeutic activity, Neuromuscular re-education, Balance training, Gait training, Patient/Family education, Self Care, Joint mobilization, Stair training, Electrical stimulation, Cryotherapy, Moist heat, Vasopneumatic device, Manual therapy, and Re-evaluation  PLAN FOR NEXT SESSION: Nustep, review and update HEP as needed, manual therapy, gait training, and modalities as needed   Adalberto Metzgar,  [place], PT 07/04/2023, 10:41 AM

## 2023-07-06 ENCOUNTER — Ambulatory Visit: Payer: Medicare Other | Admitting: *Deleted

## 2023-07-06 ENCOUNTER — Encounter: Payer: Self-pay | Admitting: *Deleted

## 2023-07-06 DIAGNOSIS — M25562 Pain in left knee: Secondary | ICD-10-CM | POA: Diagnosis not present

## 2023-07-06 DIAGNOSIS — R600 Localized edema: Secondary | ICD-10-CM

## 2023-07-06 DIAGNOSIS — M25662 Stiffness of left knee, not elsewhere classified: Secondary | ICD-10-CM

## 2023-07-06 NOTE — Therapy (Addendum)
OUTPATIENT PHYSICAL THERAPY LOWER EXTREMITY TREATMENT   Patient Name: [name] MRN: [id number] DOB:June 12, 1946, 77 y.o., female Today's Date: 07/06/2023  END OF SESSION:  PT End of Session - 07/06/23 0946     Visit Number 18    Number of Visits 18    Date for PT Re-Evaluation 07/28/23    PT Start Time 0932    PT Stop Time 1019    PT Time Calculation (min) 47 min                    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) 08/24/2020   Diabetes mellitus without complication (HCC)    takes Trulicity,Jardiance,and Metformin daily.Average fasting blood sugar runs around130   Dysrhythmia    PVCs, has MVP   Fibromyalgia    GERD (gastroesophageal reflux disease)    takes Omeprazole daily   Gout 07/19/2021   On allopurinol   History of bronchitis > 8 yrs ago   History of shingles    HTN (hypertension)    takes Amlodipine and Micardis daily   Hx of colonic polyps    benign   Internal and external hemorrhoids without complication    Joint pain    Mitral valve prolapse    Neuromuscular disorder (HCC)    restless leg syndrome   Nocturia    Pneumonia    PONV (postoperative nausea and vomiting)    when gets injections in joints gets hives.Betadine rash   Restless leg syndrome    takes Requip at bedtime   Seasonal allergies    takes Claritin daily as needed   Urinary frequency    Uterine fibroid    Past Surgical History:  Procedure Laterality Date   BUNIONECTOMY Bilateral    COLONOSCOPY  07/12/2004   diverticulosis, internal and external hemorrhoids   ETHMOIDECTOMY Bilateral 08/15/2022   Procedure: ETHMOIDECTOMY WITH TISSUE REMOVAL;  Surgeon: Newman Pies, MD;  Location: [facility];  Service: ENT;  Laterality: Bilateral;   FINGER  ARTHROSCOPY WITH CARPOMETACARPEL (CMC) ARTHROPLASTY Right 09/28/2015   Procedure: RIGHT THUMB TRAPEZIUM EXCISION WITH CARPOMETACARPEL (CMC) ARTHROPLASTY AND TENDON TRANSFER;  Surgeon: Bradly Bienenstock, MD;  Location: [facility];  Service: Orthopedics;  Laterality: Right;   FRONTAL SINUS EXPLORATION Bilateral 08/15/2022   Procedure: FRONTAL RECESS SINUS EXPLORATION WITH NAVIGATION;  Surgeon: Newman Pies, MD;  Location: [facility];  Service: ENT;  Laterality: Bilateral;   HEMORRHOID SURGERY     almost 40 yrs ago   HYSTEROSCOPY WITH D & C  08/23/2000   and resectoscopic myomectomy   KNEE CLOSED REDUCTION Left 06/05/2023   Procedure: CLOSED MANIPULATION KNEE;  Surgeon: Ollen Gross, MD;  Location: [facility];  Service: Orthopedics;  Laterality: Left;   LUMBAR DISC SURGERY  03/16/2005   LUMBAR EPIDURAL INJECTION     MAXILLARY ANTROSTOMY Bilateral 08/15/2022   Procedure: ENDOSCOPIC MAXILLARY ANTROSTOMY WITH TISSUE REMOVAL;  Surgeon: Newman Pies, MD;  Location: [facility];  Service: ENT;  Laterality: Bilateral;   PLANTAR FASCIA RELEASE Left 02/03/2010   and torn tendon   REVERSE SHOULDER ARTHROPLASTY Right 12/14/2020   Procedure: REVERSE SHOULDER ARTHROPLASTY;  Surgeon: Beverely Low, MD;  Location: [facility];  Service: Orthopedics;  Laterality: Right;   RIGHT/LEFT HEART CATH  AND CORONARY ANGIOGRAPHY N/A 09/07/2020   Procedure: RIGHT/LEFT HEART CATH AND CORONARY ANGIOGRAPHY;  Surgeon: Yvonne Kendall, MD;  Location: [facility];  Service: Cardiovascular;  Laterality: N/A;   SINUS ENDO W/FUSION Bilateral 08/15/2022   Procedure: SPHENOIDECTOMY WITH TISSUE REMOVAL WITH NAVIGATION;  Surgeon: Newman Pies, MD;  Location: [facility];  Service: ENT;  Laterality: Bilateral;   TENDON TRANSFER Right 09/28/2015   Procedure: TENDON TRANSFER;  Surgeon: Bradly Bienenstock, MD;  Location: [facility];  Service: Orthopedics;  Laterality: Right;   TONSILLECTOMY     TOTAL KNEE ARTHROPLASTY Left  04/24/2023   Procedure: TOTAL KNEE ARTHROPLASTY;  Surgeon: Ollen Gross, MD;  Location: [facility];  Service: Orthopedics;  Laterality: Left;   TUBAL LIGATION     TURBINATE REDUCTION Bilateral 08/15/2022   Procedure: TURBINATE REDUCTION;  Surgeon: Newman Pies, MD;  Location: [facility];  Service: ENT;  Laterality: Bilateral;   WRIST SURGERY     left, removal of cyst   Patient Active Problem List   Diagnosis Date Noted   Vertigo 06/20/2023   Arthrofibrosis of total knee replacement (HCC) 06/05/2023   History of total knee arthroplasty 2024, Dr. 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   Laryngospasms 04/21/2022   Muscle tension dysphonia 04/21/2022   Old tear of meniscus of left knee 09/28/2021   Combined hyperlipidemia associated with type 2 diabetes mellitus (HCC) 09/28/2021   Gout 07/19/2021   S/P shoulder replacement, right 12/14/2020   Rotator cuff tear arthropathy 10/22/2020   Nonischemic cardiomyopathy (HCC) 10/06/2020   Congestive heart failure with left ventricular systolic dysfunction (HCC) 08/24/2020   Primary osteoarthritis of left shoulder 07/29/2020   DDD (degenerative disc disease), cervical 04/30/2020   Osteoarthritis of left knee 11/05/2018   Chronic pansinusitis 10/04/2017   Chronic low back pain 09/05/2017   Degenerative lumbar disc 06/04/2015   S/P lumbar laminectomy 06/04/2015   Restless leg syndrome 05/13/2014   Type 2 diabetes mellitus with peripheral neuropathy (HCC) 04/09/2013   Spinal stenosis of lumbar region without neurogenic claudication 09/12/2012   GERD (gastroesophageal reflux disease) 05/25/2011   Hemorrhoids, internal, with bleeding 05/25/2011   Hypertension associated with diabetes (HCC) 11/08/2010   Chronic allergic rhinitis 08/19/2010   Mitral valve prolapse 10/10/2008   REFERRING PROVIDER: Eartha Inch, PA   REFERRING  DIAG: Unilateral primary osteoarthritis, left knee   THERAPY DIAG:  Acute pain of left knee  Stiffness of left knee, not elsewhere classified  Localized edema  Rationale for Evaluation and Treatment: Rehabilitation  ONSET DATE: 04/24/23 (TKA) 06/05/23 (manipulation)  SUBJECTIVE:   SUBJECTIVE STATEMENT:  Patient reports 3/10 intermittent left knee pain. To MD next week Tuesday  17th  PERTINENT HISTORY: Allergies, hypertension, diabetes, congestive heart failure, osteoarthritis, chronic low back pain, chronic kidney disease, anxiety, claustrophobia, and fibromyalgia PAIN:  Are you having pain? Yes: NPRS scale: 3/10 Pain location: left knee Pain description: constant aching, throbbing, and burning  Aggravating factors: standing, walking, and moving her knee  Relieving factors: ice and medication   PRECAUTIONS: None  RED FLAGS: None   WEIGHT BEARING RESTRICTIONS: No  FALLS:  Has patient fallen in last 6 months? No  LIVING ENVIRONMENT: Lives with: lives with their family Lives in: House/apartment Stairs: Yes: Internal: 14 steps; on right going up and External: 1 steps; none Has following equipment at home: Dan Humphreys - 2 wheeled  OCCUPATION: retired  PLOF: Independent  PATIENT GOALS: be able to paint the last two rooms of her house, be able to do her yard work, reduced pain, and improved mobility  NEXT MD VISIT: 06/20/23  OBJECTIVE: all objective measures were assessed at her initial evaluation on 04/27/23 unless otherwise noted  PATIENT SURVEYS:  FOTO 50.16 on 05/29/23  COGNITION: Overall cognitive status: Within functional limits for tasks assessed     SENSATION: Patient reports no numbness or tingling  EDEMA:  Circumferential: L tibiofemoral joint line: 53.3 cn R tibiofemoral joint line: 49.0 cm  POSTURE: forward head and flexed trunk   PALPATION: TTP: left quadriceps, hip adductors, hamstrings, medial and lateral tibiofemoral joint lines  LOWER EXTREMITY  ROM:  Active ROM Right eval Left eval 05/09/23 Left 05/29/23 Left 06/06/23  Hip flexion       Hip extension       Hip abduction       Hip adduction       Hip internal rotation       Hip external rotation       Knee flexion 133 39/ 43 (PROM) 68 degrees(passive) 61/ 75 (PROM)  73/ 69 (PROM; limited by pain)   Knee extension 0 20 -14 degrees (passive). 13 13  Ankle dorsiflexion       Ankle plantarflexion       Ankle inversion       Ankle eversion        (Blank rows = not tested)  LOWER EXTREMITY MMT: not assessed due to surgical condition  LOWER EXTREMITY SPECIAL TESTS:  Not tested due to surgical condition  GAIT: Assistive device utilized: Environmental consultant - 2 wheeled and Wheelchair (manual) Level of assistance: Modified independence and Total A Comments: Patient utilized a shuffling pattern with the FWW, but utilized for long distance mobility    TODAY'S TREATMENT:                                                                                                                              DATE:   07/06/23:                                     EXERCISE LOG   LT knee  Exercise Repetitions and Resistance Comments  Nustep Level 3 x 16 minutes   LAQ's 4# x 4 minutes   Rockerboard 4 minutes   14 in lunges for flexion 2x10 hold end-range       In supine:  PROM for left knee flexion and  extension progression.  Manual: PROM extension  14 degrees and flexion to 82 degrees                                     06/26/23 EXERCISE LOG    LT knee  Exercise Repetitions and Resistance Comments  Nustep  L3 x 15 minutes; seat 8-7   Lunges onto step  12" step x 3 minutes  LLE on step for knee flexion  Rocker board  5 minutes    Step up  6" step x 25 reps Leading with LLE   LAQ 3# x 3 mins LLE only   Seated Marches 3# x 3 mins BUE support   Seated Hip Abduction Red x 30 reps   Seated HS Curls Red x 30 reps    Blank cell = exercise not performed today                                         PATIENT EDUCATION:  Education details: HEP, plan of care, prognosis, healing, and goals for therapy Person educated: Patient Education method: Explanation Education comprehension: verbalized understanding  HOME EXERCISE PROGRAM: [id number]  ASSESSMENT:  CLINICAL IMPRESSION:  Patient arrived today doing fairly well and was able to continue with progression of ROM as well as strengthening for LT. LE. Pt able to reach 82 degrees flexion today and 14 degrees extension. ROM goals NM due to deficits.  07/06/23 PROGRESS REPORT:  Patient has made minimal progress with skilled physical therapy as evidenced by her objective measures, functional mobility, and progress toward her goals. She was able to demonstrate a slight improvement in her right knee active range of motion since her manipulation on 06/05/23. However, she has yet to meet her short or long term goals for improved left knee mobility with pain and stiffness being her primary limitations. Request two addition visits until she follows up with her referring physician on 07/18/23.  Candi Leash, PT, DPT   OBJECTIVE IMPAIRMENTS: Abnormal gait, decreased activity tolerance, decreased mobility, difficulty walking, decreased ROM, decreased strength, hypomobility, increased edema, impaired tone, and pain.   ACTIVITY LIMITATIONS: carrying, lifting, sitting, standing, squatting, stairs, transfers, bed mobility, bathing, dressing, locomotion level, and caring for others  PARTICIPATION LIMITATIONS: meal prep, cleaning, laundry, driving, shopping, community activity, and yard work  PERSONAL FACTORS: Past/current experiences, Transportation, and 3+ comorbidities: Allergies, hypertension, diabetes, congestive heart failure, osteoarthritis, chronic low back pain, chronic kidney disease, anxiety, claustrophobia, and fibromyalgia  are also affecting patient's functional outcome.   REHAB POTENTIAL: Good  CLINICAL DECISION MAKING: Evolving/moderate  complexity  EVALUATION COMPLEXITY: Moderate   GOALS: Goals reviewed with patient? Yes  SHORT TERM GOALS: Target date: 05/18/23 Patient will be independent with her initial HEP. Baseline: Goal status: MET  2.  Patient will be able to demonstrate active left knee extension within 10 degrees of neutral for improved knee mobility.  Baseline:  Goal status: ON GOING    14 degrees  3.  Patient will be able to demonstrate active left knee flexion to at least 90 degrees for improved function with transfers. Baseline:  Goal status: ON GOING    82 degrees  4.  Patient will be able to safely ambulate community distances with a walker with the least restrictive assistive device for improved community mobility. Baseline: utilizes a cane for mobility Goal status: MET  LONG TERM GOALS: Target date: 06/08/23  Patient will be independent with her advanced HEP. Baseline:  Goal status: ON GOING  2.  Patient will be able to demonstrate active left knee extension within 5 degrees of neutral for improved gait mechanics. Baseline:  Goal status: ON GOING   3.  Patient will improve her active left knee flexion to  at least 120 degrees for improved function navigating stairs. Baseline:  Goal status: ON GOING  4.  Patient will be able to safely ambulate with minimal to no gait deviations for improved mobility. Baseline:  Goal status: ON GOING  5.  Patient will be able to transfer from sitting to standing with minimal to no difficulty for improved household mobility. Baseline:  Goal status: MET  PLAN:  PT FREQUENCY: 2-3x/week  PT DURATION: 6 weeks  PLANNED INTERVENTIONS: Therapeutic exercises, Therapeutic activity, Neuromuscular re-education, Balance training, Gait training, Patient/Family education, Self Care, Joint mobilization, Stair training, Electrical stimulation, Cryotherapy, Moist heat, Vasopneumatic device, Manual therapy, and Re-evaluation  PLAN FOR NEXT SESSION: Nustep, review and  update HEP as needed, manual therapy, gait training, and modalities as needed   Recert   Jeanine Caven,CHRIS, PTA 07/06/2023, 2:20 PM

## 2023-07-07 ENCOUNTER — Encounter: Payer: Self-pay | Admitting: Family Medicine

## 2023-07-07 ENCOUNTER — Ambulatory Visit (INDEPENDENT_AMBULATORY_CARE_PROVIDER_SITE_OTHER): Payer: Medicare Other | Admitting: Family Medicine

## 2023-07-07 VITALS — BP 128/74 | HR 61 | Temp 97.7°F | Ht 64.0 in | Wt 192.6 lb

## 2023-07-07 DIAGNOSIS — E1142 Type 2 diabetes mellitus with diabetic polyneuropathy: Secondary | ICD-10-CM | POA: Diagnosis not present

## 2023-07-07 DIAGNOSIS — E1159 Type 2 diabetes mellitus with other circulatory complications: Secondary | ICD-10-CM | POA: Diagnosis not present

## 2023-07-07 DIAGNOSIS — N1831 Chronic kidney disease, stage 3a: Secondary | ICD-10-CM

## 2023-07-07 DIAGNOSIS — I152 Hypertension secondary to endocrine disorders: Secondary | ICD-10-CM

## 2023-07-07 DIAGNOSIS — Z794 Long term (current) use of insulin: Secondary | ICD-10-CM

## 2023-07-07 DIAGNOSIS — L0231 Cutaneous abscess of buttock: Secondary | ICD-10-CM

## 2023-07-07 DIAGNOSIS — T8482XS Fibrosis due to internal orthopedic prosthetic devices, implants and grafts, sequela: Secondary | ICD-10-CM

## 2023-07-07 DIAGNOSIS — H532 Diplopia: Secondary | ICD-10-CM

## 2023-07-07 LAB — POCT GLYCOSYLATED HEMOGLOBIN (HGB A1C): Hemoglobin A1C: 7.7 % — AB (ref 4.0–5.6)

## 2023-07-07 MED ORDER — LANTUS SOLOSTAR 100 UNIT/ML ~~LOC~~ SOPN: 25.0000 [IU] | PEN_INJECTOR | Freq: Every day | SUBCUTANEOUS | Status: DC

## 2023-07-07 MED ORDER — FREESTYLE LIBRE 3 PLUS SENSOR MISC: 11 refills | Status: DC

## 2023-07-07 NOTE — Patient Instructions (Signed)
Please return in 3 months for diabetes follow up   If you have any questions or concerns, please don't hesitate to send me a message via MyChart or call the office at [phone]. Thank you for visiting with [place] today! It's our pleasure caring for you.   VISIT SUMMARY:  Ms. [name], during your visit today, we discussed several health concerns including your diabetes management, a persistent abscess, intermittent double vision, knee pain post-surgery, and a recent episode of severe dizziness. We reviewed your current symptoms and made adjustments to your treatment plan to better manage your conditions.  YOUR PLAN:  -TYPE 2 DIABETES MELLITUS: Type 2 Diabetes Mellitus is a condition where your body does not use insulin properly, leading to high blood sugar levels. Your A1c has increased to 7.7, indicating that your blood sugar levels are not well controlled. We discussed the benefits of continuous glucose monitoring (CGM) and set goals for your fasting and postprandial blood glucose levels. We will increase your Lantus insulin to 25 units in the morning and aim for fasting blood glucose levels below 130 mg/dL and postprandial levels below 140 mg/dL. if you start the continuous monitor, we want to aim for staying in range at least 80% of the time. hopefully this will help you improve your diet.   -HYPERTENSION: Hypertension, or high blood pressure, can be influenced by pain and stress. Your home readings are generally within a good range, but recent high readings may be due to knee pain. Continue to monitor your blood pressure at home and contact your cardiologist if it remains high.  -CHRONIC ABSCESS: A chronic abscess is a collection of pus that has not healed with antibiotics. Your persistent abscess on the buttocks is causing discomfort and drainage. We will refer you to a surgeon for evaluation and possible drainage.  -CHRONIC KNEE PAIN POST-SURGERY: Chronic knee pain after surgery can result from  incomplete healing or other complications. Despite physical therapy, you are experiencing significant discomfort and stiffness. Continue with physical therapy and follow up with your orthopedic surgeon as scheduled.  -INTERMITTENT DOUBLE VISION: Intermittent double vision can occur due to issues with eye muscle movement. You have been experiencing this, especially at a distance, and it sometimes resolves by closing one eye. We recommend scheduling an eye exam at the beginning of the year and monitoring your symptoms.  INSTRUCTIONS:  Please follow up in 3 months. We will call you with the appointment details for the surgeon. Additionally, report the findings from your eye exam once it is completed.

## 2023-07-07 NOTE — Progress Notes (Signed)
Subjective  CC:  Chief Complaint  Patient presents with   Diabetes   Abscess of right buttock    Pt stated that she still has the abscess on her buttock    HPI: [name] is a 77 y.o. female who presents to the office today for follow up of diabetes and problems listed above in the chief complaint.  Discussed the use of AI scribe software for clinical note transcription with the patient, who gave verbal consent to proceed.  History of Present Illness   [name], a patient with a history of diabetes, presents with concerns about her blood glucose control. She admits to recent dietary indiscretions and acknowledges that her A1c has increased from the sixes to 7.7 over the past six months. She denies experiencing hypoglycemic episodes since adjusting the timing of her Lantus insulin. She has been monitoring her blood glucose levels intermittently, noting morning readings ranging from 110 to 200 and postprandial readings around 160.  The patient also reports a persistent abscess on her buttocks that has been draining and causing discomfort, particularly when sitting. Despite previous antibiotic treatment, the abscess has not improved.  In addition, she has been experiencing intermittent double vision for several months, occurring approximately once or twice a month. The double vision seems to be more pronounced at a distance and can sometimes be corrected by adjusting her glasses or closing one eye.  She also reports ongoing issues with her knee following recent surgery. She had corrective surgery for postoperative fibrosis but still with limited rom. Despite physical therapy, she has been unable to achieve full range of motion and experiences persistent pain, which she believes may be contributing to elevated blood pressure readings.  Lastly, she had a recent episode of severe dizziness, dxd as BPVV and has improved. I reviewed notsThis resolved after a few days with medication and  self-administered exercises similar to those performed by her doctor.      Wt Readings from Last 3 Encounters:  07/07/23 192 lb 9.6 oz (87.4 kg)  06/20/23 187 lb 8 oz (85 kg)  06/05/23 190 lb (86.2 kg)    BP Readings from Last 3 Encounters:  07/07/23 128/74  06/20/23 138/76  06/05/23 (!) 180/65    Assessment  1. Type 2 diabetes mellitus with peripheral neuropathy (HCC)   2. Stage 3a chronic kidney disease (HCC)   3. Hypertension associated with diabetes (HCC)   4. Arthrofibrosis of total knee replacement, sequela   5. Abscess of right buttock   6. Double vision with both eyes open      Plan  Diabetes is currently marginally controlled. Partly due to diet. She does not want to add medications at this time: Assessment and Plan    Type 2 Diabetes Mellitus A1c is 7.7, above target range. Blood glucose levels are variable, with fasting up to 200 mg/dL and postprandial up to 160 mg/dL. Inconsistent glucose monitoring and Lantus administration. Discussed benefits and learning curve of continuous glucose monitoring (CGM). Set goals: fasting <130 mg/dL, postprandial <161 mg/dL to reduce complications. - Order CGM if covered by insurance - Increase Lantus to 25 units in the morning - Set fasting blood glucose goal to <130 mg/dL - Set postprandial blood glucose goal to <140 mg/dL  Hypertension Home blood pressure readings are generally 120-130/60-70 mmHg. Recent high reading likely due to pain from physical therapy. Emphasized regular monitoring and contacting cardiologist if elevated. - Monitor blood pressure at home - Contact cardiologist if blood pressure remains  high  Chronic Abscess Persistent buttock abscess unresponsive to antibiotics, causing discomfort and drainage. Discussed need for surgical evaluation and drainage. - Refer to a surgeon for evaluation and possible drainage  Chronic Knee Pain Post-Surgery Persistent knee pain and stiffness post-surgery, despite physical  therapy. Significant discomfort reported, especially post-therapy. Discussed potential for improvement with continued therapy. - Continue physical therapy - Follow up with orthopedic surgeon as scheduled  Intermittent Double Vision Intermittent double vision, particularly at a distance, resolved by closing one eye. Likely related to eye muscle movement. Discussed need for eye exam and potential corrective measures. - Schedule eye exam at the beginning of the year - Monitor and report persistent double vision  Follow-up - Follow up in 3 months - Call with appointment details for the surgeon - Report findings from the eye exam.     Orders Placed This Encounter  Procedures   Ambulatory referral to General Surgery   POCT HgB A1C   Meds ordered this encounter  Medications   insulin glargine (LANTUS SOLOSTAR) 100 UNIT/ML Solostar Pen    Sig: Inject 25 Units into the skin daily.      Immunization History  Administered Date(s) Administered   Fluad Quad(high Dose 65+) 04/19/2018, 06/16/2020, 07/01/2020, 04/16/2021, 05/10/2022   Fluad Trivalent(High Dose 65+) 05/24/2023   Influenza Split 06/01/2010   Influenza, High Dose Seasonal PF 05/13/2014, 04/24/2015, 04/12/2017, 04/19/2018, 04/24/2019, 06/16/2020   Influenza, Seasonal, Injecte, Preservative Fre 04/09/2013, 05/03/2016   Influenza,inj,Quad PF,6+ Mos 05/01/2017   Influenza-Unspecified 04/03/2012   Moderna Sars-Covid-2 Vaccination 01/15/2021, 02/12/2021   Pneumococcal Conjugate-13 05/13/2014   Pneumococcal Polysaccharide-23 02/15/2011   Td 08/01/1998   Td (Adult), 2 Lf Tetanus Toxid, Preservative Free 08/01/1998   Tdap 05/01/2004   Unspecified SARS-COV-2 Vaccination 05/11/2021   Zoster Recombinant(Shingrix) 04/24/2019, 08/13/2019   Zoster, Live 02/15/2011    Diabetes Related Lab Review: Lab Results  Component Value Date   HGBA1C 7.7 (A) 07/07/2023   HGBA1C 7.7 (H) 04/13/2023   HGBA1C 6.6 (A) 01/06/2023    Lab Results   Component Value Date   MICROALBUR 1.8 10/04/2022   Lab Results  Component Value Date   CREATININE 1.12 (H) 04/25/2023   BUN 28 (H) 04/25/2023   NA 133 (L) 04/25/2023   K 5.1 04/25/2023   CL 103 04/25/2023   CO2 23 04/25/2023   Lab Results  Component Value Date   CHOL 109 10/04/2022   CHOL 111 09/28/2021   Lab Results  Component Value Date   HDL 57.90 10/04/2022   HDL 55.70 09/28/2021   Lab Results  Component Value Date   LDLCALC 31 10/04/2022   LDLCALC 37 09/28/2021   Lab Results  Component Value Date   TRIG 102.0 10/04/2022   TRIG 91.0 09/28/2021   TRIG 194 (H) 08/13/2020   Lab Results  Component Value Date   CHOLHDL 2 10/04/2022   CHOLHDL 2 09/28/2021   No results found for: "LDLDIRECT" The ASCVD Risk score (Arnett DK, et al., 2019) failed to calculate for the following reasons:   The valid total cholesterol range is 130 to 320 mg/dL I have reviewed the PMH, Fam and Soc history. 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 Farxiga, lantus, met 500 bid    Hypertension associated with diabetes (HCC) 11/08/2010     Priority: High   History of total knee arthroplasty 2024, Dr. 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     Priority: Medium    Gout 07/19/2021      Priority: Medium     On allopurinol    Rotator cuff tear arthropathy 10/22/2020     Priority: Medium    Primary osteoarthritis of left shoulder 07/29/2020     Priority: Medium    DDD (degenerative disc disease), cervical 04/30/2020     Priority: Medium    Osteoarthritis of left knee 11/05/2018     Priority: Medium    Chronic low back pain 09/05/2017     Priority: Medium    Degenerative lumbar disc 06/04/2015     Priority: Medium    S/P lumbar laminectomy 06/04/2015     Priority: Medium    Restless leg syndrome 05/13/2014     Priority: Medium    Spinal stenosis of lumbar region without neurogenic claudication 09/12/2012     Priority: Medium     Formatting of this note might be different from the original. Right L4/5    GERD (gastroesophageal reflux disease) 05/25/2011     Priority: Medium     Chronic recurrent heartburn symptoms for 3 or 4 years without weight loss, bleeding or dysphagia. Bonds well to daily PPI.Iva Boop, MD, [facility]     Mitral valve prolapse 10/10/2008     Priority: Medium     Formatting of this note might be different from the original. Prolapsing Mitral Valve Leaflet Syndrome - echo at [facility] 2008, palpitations are symptomatic    Screening for osteoporosis 10/04/2022     Priority: Low    Dexa 2022: normal. Recheck 77 years    Old tear of meniscus of left knee 09/28/2021     Priority: Low   S/P shoulder replacement, right 12/14/2020     Priority: Low   Chronic pansinusitis 10/04/2017     Priority: Low    Dr. Suszanne Conners, CT scan, sinus surgery 2023    Hemorrhoids, internal, with bleeding 05/25/2011     Priority: Low    Visible on 2005 colonoscopy and endoscopy 05/25/2011.    Chronic allergic rhinitis 08/19/2010     Priority: Low   Vertigo 06/20/2023     VERTIGO - H81.13 #NewProblem #RequiresFollowUp Suspect benign paroxysmal positional vertigo (BPPV)  Onset: 06/18/2023 - Sudden onset during church service, no preceding trauma  Key  Clinical Data: - Prior episode: Single occurrence ~40 years ago, less severe - Exam Findings:   - Nystagmus observed during Epley maneuver (positive dix-halpike)   - HINTS exam: Delayed refixation (~0.25s), preserved tracking with cover test   - Positive response to right-sided Epley maneuver - Associated Symptoms:   - Severe nausea/vomiting   - Headache (8/10) with purulent sinus congestion.   - Neck stiffness without meningeal signs   - Pre-existing binocular diplopia x6 months  Treatment History: - Current:   - Epley maneuver x3 with partial improvement (06/20/2023)   - Zofran 4mg  IM at 10:11 AM (06/20/2023)   - Phenergan IM at 10:16 AM (06/20/2023)   - Meclizine prescribed (06/20/2023) - Past:   - Failed trial of oral Zofran on morning of presentation  Recent Developments: - Partial improvement with repositioning - Complicated by concurrent sinusitis - Patient able to perform Epley maneuver independently after instruction  Management Plan: - Follow-up scheduled this week - Consider vestibular testing if symptoms persist - Monitor chronic diplopia separately - Home Epley maneuvers as needed  Risk Assessment: - Current Risk Level: Moderate - Risk Factors:   - Fall risk due to severe imbalance   - Chronic diplopia requiring evaluation   - Age-related risk for recurrence     Arthrofibrosis of total knee replacement (HCC) 06/05/2023     Social History: Patient  reports that she has never smoked. She has never used smokeless tobacco. She reports that she does not drink alcohol and does not use drugs.  Review of Systems: Ophthalmic: negative for eye pain, loss of vision or double vision Cardiovascular: negative for chest pain Respiratory: negative for SOB or persistent cough Gastrointestinal: negative for abdominal pain Genitourinary: negative for dysuria or gross hematuria MSK: negative for foot lesions Neurologic: negative for weakness or gait  disturbance  Objective  Vitals: BP 128/74 Comment: by consistent home readings/cla  Pulse 61   Temp 97.7 F (36.5 C)   Ht 5\' 4"  (1.626 m)   Wt 192 lb 9.6 oz (87.4 kg)   SpO2 98%   BMI 33.06 kg/m  General: well appearing, no acute distress  Psych:  Alert and oriented, normal mood and affect HEENT:  Normocephalic, atraumatic, moist mucous membranes, supple neck  EOMI but some double vision with lateral gaze on both sides.  Diabetic education: ongoing education regarding chronic disease management for diabetes was given today. We continue to reinforce the ABC's of diabetic management: A1c (<7 or 8 dependent upon patient), tight blood pressure control, and cholesterol management with goal LDL < 100 minimally. We discuss diet strategies, exercise recommendations, medication options and possible side effects. At each visit, we review recommended immunizations and preventive care recommendations for diabetics and stress that good diabetic control can prevent other problems. See below for this patient's data.   Commons side effects, risks, benefits, and alternatives for medications and treatment plan prescribed today were discussed, and the patient expressed understanding of the given instructions. Patient is instructed to call or message via MyChart if he/she has any questions or concerns regarding our treatment plan. No barriers to understanding were identified. We discussed Red Flag symptoms and signs in detail. Patient expressed understanding regarding what to do in case of urgent or emergency type symptoms.  Medication list was reconciled, printed and provided to the patient in AVS. Patient instructions and summary information was reviewed with the patient as documented in the AVS. This note was prepared with assistance of Dragon voice recognition software. Occasional wrong-word or sound-a-like substitutions may have occurred due to the inherent limitations of voice recognition software

## 2023-07-07 NOTE — Addendum Note (Signed)
Addended by: Granville Lewis on: 07/07/2023 10:34 AM   Modules accepted: Orders

## 2023-07-11 ENCOUNTER — Ambulatory Visit: Payer: Medicare Other | Admitting: Physical Therapy

## 2023-07-11 DIAGNOSIS — M25662 Stiffness of left knee, not elsewhere classified: Secondary | ICD-10-CM

## 2023-07-11 DIAGNOSIS — M25562 Pain in left knee: Secondary | ICD-10-CM | POA: Diagnosis not present

## 2023-07-11 DIAGNOSIS — R600 Localized edema: Secondary | ICD-10-CM

## 2023-07-11 NOTE — Therapy (Signed)
OUTPATIENT PHYSICAL THERAPY LOWER EXTREMITY TREATMENT   Patient Name: [name] MRN: [id number] DOB:1945/11/30, 77 y.o., female Today's Date: 07/11/2023  END OF SESSION:  PT End of Session - 07/11/23 0951     Visit Number 19    Number of Visits 20    Date for PT Re-Evaluation 07/28/23    PT Start Time 0930    PT Stop Time 1007    PT Time Calculation (min) 37 min    Activity Tolerance Patient tolerated treatment well    Behavior During Therapy [facility] for tasks assessed/performed                    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) 08/24/2020   Diabetes mellitus without complication (HCC)    takes Trulicity,Jardiance,and Metformin daily.Average fasting blood sugar runs around130   Dysrhythmia    PVCs, has MVP   Fibromyalgia    GERD (gastroesophageal reflux disease)    takes Omeprazole daily   Gout 07/19/2021   On allopurinol   History of bronchitis > 8 yrs ago   History of shingles    HTN (hypertension)    takes Amlodipine and Micardis daily   Hx of colonic polyps    benign   Internal and external hemorrhoids without complication    Joint pain    Mitral valve prolapse    Neuromuscular disorder (HCC)    restless leg syndrome   Nocturia    Pneumonia    PONV (postoperative nausea and vomiting)    when gets injections in joints gets hives.Betadine rash   Restless leg syndrome    takes Requip at bedtime   Seasonal allergies    takes Claritin daily as needed   Urinary frequency    Uterine fibroid    Past Surgical History:  Procedure Laterality Date   BUNIONECTOMY Bilateral    COLONOSCOPY  07/12/2004   diverticulosis, internal and external hemorrhoids   ETHMOIDECTOMY Bilateral 08/15/2022   Procedure: ETHMOIDECTOMY WITH TISSUE REMOVAL;   Surgeon: Newman Pies, MD;  Location: [facility];  Service: ENT;  Laterality: Bilateral;   FINGER ARTHROSCOPY WITH CARPOMETACARPEL (CMC) ARTHROPLASTY Right 09/28/2015   Procedure: RIGHT THUMB TRAPEZIUM EXCISION WITH CARPOMETACARPEL (CMC) ARTHROPLASTY AND TENDON TRANSFER;  Surgeon: Bradly Bienenstock, MD;  Location: [facility];  Service: Orthopedics;  Laterality: Right;   FRONTAL SINUS EXPLORATION Bilateral 08/15/2022   Procedure: FRONTAL RECESS SINUS EXPLORATION WITH NAVIGATION;  Surgeon: Newman Pies, MD;  Location: [facility];  Service: ENT;  Laterality: Bilateral;   HEMORRHOID SURGERY     almost 40 yrs ago   HYSTEROSCOPY WITH D & C  08/23/2000   and resectoscopic myomectomy   KNEE CLOSED REDUCTION Left 06/05/2023   Procedure: CLOSED MANIPULATION KNEE;  Surgeon: Ollen Gross, MD;  Location: [facility];  Service: Orthopedics;  Laterality: Left;   LUMBAR DISC SURGERY  03/16/2005   LUMBAR EPIDURAL INJECTION     MAXILLARY ANTROSTOMY Bilateral 08/15/2022   Procedure: ENDOSCOPIC MAXILLARY ANTROSTOMY WITH TISSUE REMOVAL;  Surgeon: Newman Pies, MD;  Location: [facility];  Service: ENT;  Laterality: Bilateral;   PLANTAR FASCIA RELEASE Left 02/03/2010   and torn tendon   REVERSE SHOULDER ARTHROPLASTY Right 12/14/2020   Procedure: REVERSE SHOULDER ARTHROPLASTY;  Surgeon: Beverely Low, MD;  Location: [facility];  Service: Orthopedics;  Laterality: Right;   RIGHT/LEFT HEART CATH AND CORONARY ANGIOGRAPHY N/A 09/07/2020   Procedure: RIGHT/LEFT HEART CATH AND CORONARY ANGIOGRAPHY;  Surgeon: Yvonne Kendall, MD;  Location: [facility];  Service: Cardiovascular;  Laterality: N/A;   SINUS ENDO W/FUSION Bilateral 08/15/2022   Procedure: SPHENOIDECTOMY WITH TISSUE REMOVAL WITH NAVIGATION;  Surgeon: Newman Pies, MD;  Location: [facility];  Service: ENT;  Laterality: Bilateral;   TENDON TRANSFER Right 09/28/2015   Procedure: TENDON TRANSFER;  Surgeon: Bradly Bienenstock, MD;   Location: [facility];  Service: Orthopedics;  Laterality: Right;   TONSILLECTOMY     TOTAL KNEE ARTHROPLASTY Left 04/24/2023   Procedure: TOTAL KNEE ARTHROPLASTY;  Surgeon: Ollen Gross, MD;  Location: [facility];  Service: Orthopedics;  Laterality: Left;   TUBAL LIGATION     TURBINATE REDUCTION Bilateral 08/15/2022   Procedure: TURBINATE REDUCTION;  Surgeon: Newman Pies, MD;  Location: [facility];  Service: ENT;  Laterality: Bilateral;   WRIST SURGERY     left, removal of cyst   Patient Active Problem List   Diagnosis Date Noted   Vertigo 06/20/2023   Arthrofibrosis of total knee replacement (HCC) 06/05/2023   History of total knee arthroplasty 2024, Dr. 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   Laryngospasms 04/21/2022   Muscle tension dysphonia 04/21/2022   Old tear of meniscus of left knee 09/28/2021   Combined hyperlipidemia associated with type 2 diabetes mellitus (HCC) 09/28/2021   Gout 07/19/2021   S/P shoulder replacement, right 12/14/2020   Rotator cuff tear arthropathy 10/22/2020   Nonischemic cardiomyopathy (HCC) 10/06/2020   Congestive heart failure with left ventricular systolic dysfunction (HCC) 08/24/2020   Primary osteoarthritis of left shoulder 07/29/2020   DDD (degenerative disc disease), cervical 04/30/2020   Osteoarthritis of left knee 11/05/2018   Chronic pansinusitis 10/04/2017   Chronic low back pain 09/05/2017   Degenerative lumbar disc 06/04/2015   S/P lumbar laminectomy 06/04/2015   Restless leg syndrome 05/13/2014   Type 2 diabetes mellitus with peripheral neuropathy (HCC) 04/09/2013   Spinal stenosis of lumbar region without neurogenic claudication 09/12/2012   GERD (gastroesophageal reflux disease) 05/25/2011   Hemorrhoids, internal, with bleeding 05/25/2011   Hypertension associated with diabetes (HCC) 11/08/2010   Chronic allergic  rhinitis 08/19/2010   Mitral valve prolapse 10/10/2008   REFERRING PROVIDER: Eartha Inch, PA   REFERRING DIAG: Unilateral primary osteoarthritis, left knee   THERAPY DIAG:  Acute pain of left knee  Stiffness of left knee, not elsewhere classified  Localized edema  Rationale for Evaluation and Treatment: Rehabilitation  ONSET DATE: 04/24/23 (TKA) 06/05/23 (manipulation)  SUBJECTIVE:   SUBJECTIVE STATEMENT:  About the same.  PERTINENT HISTORY: Allergies, hypertension, diabetes, congestive heart failure, osteoarthritis, chronic low back pain, chronic kidney disease, anxiety, claustrophobia, and fibromyalgia PAIN:  Are you having pain? Yes: NPRS scale: 3/10 Pain location: left knee Pain description: constant aching, throbbing, and burning  Aggravating factors: standing, walking, and moving her knee  Relieving factors: ice and medication   PRECAUTIONS: None  RED FLAGS: None   WEIGHT BEARING RESTRICTIONS: No  FALLS:  Has patient fallen in last 6 months? No  LIVING ENVIRONMENT: Lives with: lives with their family Lives in: House/apartment Stairs: Yes: Internal: 14 steps; on right going up and External: 1 steps; none Has following equipment at home: Dan Humphreys -  2 wheeled  OCCUPATION: retired  PLOF: Independent  PATIENT GOALS: be able to paint the last two rooms of her house, be able to do her yard work, reduced pain, and improved mobility  NEXT MD VISIT: 06/20/23  OBJECTIVE: all objective measures were assessed at her initial evaluation on 04/27/23 unless otherwise noted  PATIENT SURVEYS:  FOTO 50.16 on 05/29/23  COGNITION: Overall cognitive status: Within functional limits for tasks assessed     SENSATION: Patient reports no numbness or tingling  EDEMA:  Circumferential: L tibiofemoral joint line: 53.3 cn R tibiofemoral joint line: 49.0 cm  POSTURE: forward head and flexed trunk   PALPATION: TTP: left quadriceps, hip adductors, hamstrings, medial and  lateral tibiofemoral joint lines  LOWER EXTREMITY ROM:  Active ROM Right eval Left eval 05/09/23 Left 05/29/23 Left 06/06/23  Hip flexion       Hip extension       Hip abduction       Hip adduction       Hip internal rotation       Hip external rotation       Knee flexion 133 39/ 43 (PROM) 68 degrees(passive) 61/ 75 (PROM)  73/ 69 (PROM; limited by pain)   Knee extension 0 20 -14 degrees (passive). 13 13  Ankle dorsiflexion       Ankle plantarflexion       Ankle inversion       Ankle eversion        (Blank rows = not tested)  LOWER EXTREMITY MMT: not assessed due to surgical condition  LOWER EXTREMITY SPECIAL TESTS:  Not tested due to surgical condition  GAIT: Assistive device utilized: Environmental consultant - 2 wheeled and Wheelchair (manual) Level of assistance: Modified independence and Total A Comments: Patient utilized a shuffling pattern with the FWW, but utilized for long distance mobility    TODAY'S TREATMENT:                                                                                                                              DATE:    07/11/23:                                     EXERCISE LOG  Exercise Repetitions and Resistance Comments  Nustep Level 4 x 15 minutes   Rockerboard in parallel bars 5 minutes   LAQ's 3 minutes.           PROM 10 minutes to left knee flexion and extension. 07/06/23:                                     EXERCISE LOG   LT knee  Exercise Repetitions and Resistance Comments  Nustep Level 3 x 16 minutes   LAQ's 4# x 4 minutes   Rockerboard  4 minutes   14 in lunges for flexion 2x10 hold end-range       In supine:  PROM for left knee flexion and  extension progression.  Manual: PROM extension  14 degrees and flexion to 82 degrees                                     06/26/23 EXERCISE LOG    LT knee  Exercise Repetitions and Resistance Comments  Nustep L3 x 15 minutes; seat 8-7   Lunges onto step  12" step x 3 minutes  LLE on step  for knee flexion  Rocker board  5 minutes    Step up  6" step x 25 reps Leading with LLE   LAQ 3# x 3 mins LLE only   Seated Marches 3# x 3 mins BUE support   Seated Hip Abduction Red x 30 reps   Seated HS Curls Red x 30 reps    Blank cell = exercise not performed today                                        PATIENT EDUCATION:  Education details: HEP, plan of care, prognosis, healing, and goals for therapy Person educated: Patient Education method: Explanation Education comprehension: verbalized understanding  HOME EXERCISE PROGRAM: [id number]  ASSESSMENT:  CLINICAL IMPRESSION:  Patient doing fairly well. Performed PROM to patient tolerance.  She has one remaining visit.    07/06/23 PROGRESS REPORT:  Patient has made minimal progress with skilled physical therapy as evidenced by her objective measures, functional mobility, and progress toward her goals. She was able to demonstrate a slight improvement in her right knee active range of motion since her manipulation on 06/05/23. However, she has yet to meet her short or long term goals for improved left knee mobility with pain and stiffness being her primary limitations. Request two addition visits until she follows up with her referring physician on 07/18/23.  Candi Leash, PT, DPT   OBJECTIVE IMPAIRMENTS: Abnormal gait, decreased activity tolerance, decreased mobility, difficulty walking, decreased ROM, decreased strength, hypomobility, increased edema, impaired tone, and pain.   ACTIVITY LIMITATIONS: carrying, lifting, sitting, standing, squatting, stairs, transfers, bed mobility, bathing, dressing, locomotion level, and caring for others  PARTICIPATION LIMITATIONS: meal prep, cleaning, laundry, driving, shopping, community activity, and yard work  PERSONAL FACTORS: Past/current experiences, Transportation, and 3+ comorbidities: Allergies, hypertension, diabetes, congestive heart failure, osteoarthritis, chronic low back pain,  chronic kidney disease, anxiety, claustrophobia, and fibromyalgia  are also affecting patient's functional outcome.   REHAB POTENTIAL: Good  CLINICAL DECISION MAKING: Evolving/moderate complexity  EVALUATION COMPLEXITY: Moderate   GOALS: Goals reviewed with patient? Yes  SHORT TERM GOALS: Target date: 05/18/23 Patient will be independent with her initial HEP. Baseline: Goal status: MET  2.  Patient will be able to demonstrate active left knee extension within 10 degrees of neutral for improved knee mobility.  Baseline:  Goal status: ON GOING    14 degrees  3.  Patient will be able to demonstrate active left knee flexion to at least 90 degrees for improved function with transfers. Baseline:  Goal status: ON GOING    82 degrees  4.  Patient will be able to safely ambulate community distances with a walker with the least restrictive assistive device for improved community mobility. Baseline:  utilizes a cane for mobility Goal status: MET  LONG TERM GOALS: Target date: 06/08/23  Patient will be independent with her advanced HEP. Baseline:  Goal status: ON GOING  2.  Patient will be able to demonstrate active left knee extension within 5 degrees of neutral for improved gait mechanics. Baseline:  Goal status: ON GOING   3.  Patient will improve her active left knee flexion to at least 120 degrees for improved function navigating stairs. Baseline:  Goal status: ON GOING  4.  Patient will be able to safely ambulate with minimal to no gait deviations for improved mobility. Baseline:  Goal status: ON GOING  5.  Patient will be able to transfer from sitting to standing with minimal to no difficulty for improved household mobility. Baseline:  Goal status: MET  PLAN:  PT FREQUENCY: 2-3x/week  PT DURATION: 6 weeks  PLANNED INTERVENTIONS: Therapeutic exercises, Therapeutic activity, Neuromuscular re-education, Balance training, Gait training, Patient/Family education, Self  Care, Joint mobilization, Stair training, Electrical stimulation, Cryotherapy, Moist heat, Vasopneumatic device, Manual therapy, and Re-evaluation  PLAN FOR NEXT SESSION: Nustep, review and update HEP as needed, manual therapy, gait training, and modalities as needed   Recert   Anas Reister, [place], PT 07/11/2023, 12:28 PM

## 2023-07-13 ENCOUNTER — Encounter: Payer: Self-pay | Admitting: Surgery

## 2023-07-13 ENCOUNTER — Ambulatory Visit: Payer: Medicare Other | Admitting: *Deleted

## 2023-07-13 ENCOUNTER — Ambulatory Visit (INDEPENDENT_AMBULATORY_CARE_PROVIDER_SITE_OTHER): Payer: Medicare Other | Admitting: Surgery

## 2023-07-13 VITALS — BP 161/75 | HR 59 | Temp 97.5°F | Resp 12 | Ht 64.0 in | Wt 191.0 lb

## 2023-07-13 DIAGNOSIS — L03317 Cellulitis of buttock: Secondary | ICD-10-CM

## 2023-07-13 DIAGNOSIS — R222 Localized swelling, mass and lump, trunk: Secondary | ICD-10-CM | POA: Diagnosis not present

## 2023-07-13 DIAGNOSIS — M25562 Pain in left knee: Secondary | ICD-10-CM

## 2023-07-13 DIAGNOSIS — M25662 Stiffness of left knee, not elsewhere classified: Secondary | ICD-10-CM

## 2023-07-13 DIAGNOSIS — R600 Localized edema: Secondary | ICD-10-CM

## 2023-07-13 MED ORDER — SULFAMETHOXAZOLE-TRIMETHOPRIM 800-160 MG PO TABS: 1.0000 | ORAL_TABLET | Freq: Two times a day (BID) | ORAL | 0 refills | Status: DC

## 2023-07-13 NOTE — Therapy (Signed)
OUTPATIENT PHYSICAL THERAPY LOWER EXTREMITY TREATMENT   Patient Name: [name] MRN: [id number] DOB:02/26/1946, 77 y.o., female Today's Date: 07/13/2023  END OF SESSION:  PT End of Session - 07/13/23 0930     Visit Number 20    Number of Visits 20    Date for PT Re-Evaluation 07/28/23    PT Start Time 0930    PT Stop Time 1020    PT Time Calculation (min) 50 min                    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) 08/24/2020   Diabetes mellitus without complication (HCC)    takes Trulicity,Jardiance,and Metformin daily.Average fasting blood sugar runs around130   Dysrhythmia    PVCs, has MVP   Fibromyalgia    GERD (gastroesophageal reflux disease)    takes Omeprazole daily   Gout 07/19/2021   On allopurinol   History of bronchitis > 8 yrs ago   History of shingles    HTN (hypertension)    takes Amlodipine and Micardis daily   Hx of colonic polyps    benign   Internal and external hemorrhoids without complication    Joint pain    Mitral valve prolapse    Neuromuscular disorder (HCC)    restless leg syndrome   Nocturia    Pneumonia    PONV (postoperative nausea and vomiting)    when gets injections in joints gets hives.Betadine rash   Restless leg syndrome    takes Requip at bedtime   Seasonal allergies    takes Claritin daily as needed   Urinary frequency    Uterine fibroid    Past Surgical History:  Procedure Laterality Date   BUNIONECTOMY Bilateral    COLONOSCOPY  07/12/2004   diverticulosis, internal and external hemorrhoids   ETHMOIDECTOMY Bilateral 08/15/2022   Procedure: ETHMOIDECTOMY WITH TISSUE REMOVAL;  Surgeon: Newman Pies, MD;  Location: [facility];  Service: ENT;  Laterality: Bilateral;   FINGER  ARTHROSCOPY WITH CARPOMETACARPEL (CMC) ARTHROPLASTY Right 09/28/2015   Procedure: RIGHT THUMB TRAPEZIUM EXCISION WITH CARPOMETACARPEL (CMC) ARTHROPLASTY AND TENDON TRANSFER;  Surgeon: Bradly Bienenstock, MD;  Location: [facility];  Service: Orthopedics;  Laterality: Right;   FRONTAL SINUS EXPLORATION Bilateral 08/15/2022   Procedure: FRONTAL RECESS SINUS EXPLORATION WITH NAVIGATION;  Surgeon: Newman Pies, MD;  Location: [facility];  Service: ENT;  Laterality: Bilateral;   HEMORRHOID SURGERY     almost 40 yrs ago   HYSTEROSCOPY WITH D & C  08/23/2000   and resectoscopic myomectomy   KNEE CLOSED REDUCTION Left 06/05/2023   Procedure: CLOSED MANIPULATION KNEE;  Surgeon: Ollen Gross, MD;  Location: [facility];  Service: Orthopedics;  Laterality: Left;   LUMBAR DISC SURGERY  03/16/2005   LUMBAR EPIDURAL INJECTION     MAXILLARY ANTROSTOMY Bilateral 08/15/2022   Procedure: ENDOSCOPIC MAXILLARY ANTROSTOMY WITH TISSUE REMOVAL;  Surgeon: Newman Pies, MD;  Location: [facility];  Service: ENT;  Laterality: Bilateral;   PLANTAR FASCIA RELEASE Left 02/03/2010   and torn tendon   REVERSE SHOULDER ARTHROPLASTY Right 12/14/2020   Procedure: REVERSE SHOULDER ARTHROPLASTY;  Surgeon: Beverely Low, MD;  Location: [facility];  Service: Orthopedics;  Laterality: Right;   RIGHT/LEFT HEART CATH  AND CORONARY ANGIOGRAPHY N/A 09/07/2020   Procedure: RIGHT/LEFT HEART CATH AND CORONARY ANGIOGRAPHY;  Surgeon: Yvonne Kendall, MD;  Location: [facility];  Service: Cardiovascular;  Laterality: N/A;   SINUS ENDO W/FUSION Bilateral 08/15/2022   Procedure: SPHENOIDECTOMY WITH TISSUE REMOVAL WITH NAVIGATION;  Surgeon: Newman Pies, MD;  Location: [facility];  Service: ENT;  Laterality: Bilateral;   TENDON TRANSFER Right 09/28/2015   Procedure: TENDON TRANSFER;  Surgeon: Bradly Bienenstock, MD;  Location: [facility];  Service: Orthopedics;  Laterality: Right;   TONSILLECTOMY     TOTAL KNEE ARTHROPLASTY Left  04/24/2023   Procedure: TOTAL KNEE ARTHROPLASTY;  Surgeon: Ollen Gross, MD;  Location: [facility];  Service: Orthopedics;  Laterality: Left;   TUBAL LIGATION     TURBINATE REDUCTION Bilateral 08/15/2022   Procedure: TURBINATE REDUCTION;  Surgeon: Newman Pies, MD;  Location: [facility];  Service: ENT;  Laterality: Bilateral;   WRIST SURGERY     left, removal of cyst   Patient Active Problem List   Diagnosis Date Noted   Vertigo 06/20/2023   Arthrofibrosis of total knee replacement (HCC) 06/05/2023   History of total knee arthroplasty 2024, Dr. 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   Laryngospasms 04/21/2022   Muscle tension dysphonia 04/21/2022   Old tear of meniscus of left knee 09/28/2021   Combined hyperlipidemia associated with type 2 diabetes mellitus (HCC) 09/28/2021   Gout 07/19/2021   S/P shoulder replacement, right 12/14/2020   Rotator cuff tear arthropathy 10/22/2020   Nonischemic cardiomyopathy (HCC) 10/06/2020   Congestive heart failure with left ventricular systolic dysfunction (HCC) 08/24/2020   Primary osteoarthritis of left shoulder 07/29/2020   DDD (degenerative disc disease), cervical 04/30/2020   Osteoarthritis of left knee 11/05/2018   Chronic pansinusitis 10/04/2017   Chronic low back pain 09/05/2017   Degenerative lumbar disc 06/04/2015   S/P lumbar laminectomy 06/04/2015   Restless leg syndrome 05/13/2014   Type 2 diabetes mellitus with peripheral neuropathy (HCC) 04/09/2013   Spinal stenosis of lumbar region without neurogenic claudication 09/12/2012   GERD (gastroesophageal reflux disease) 05/25/2011   Hemorrhoids, internal, with bleeding 05/25/2011   Hypertension associated with diabetes (HCC) 11/08/2010   Chronic allergic rhinitis 08/19/2010   Mitral valve prolapse 10/10/2008   REFERRING PROVIDER: Eartha Inch, PA   REFERRING  DIAG: Unilateral primary osteoarthritis, left knee   THERAPY DIAG:  Acute pain of left knee  Stiffness of left knee, not elsewhere classified  Localized edema  Rationale for Evaluation and Treatment: Rehabilitation  ONSET DATE: 04/24/23 (TKA) 06/05/23 (manipulation)  SUBJECTIVE:   SUBJECTIVE STATEMENT:   LT knee is still very tight. To MD Tuesday  PERTINENT HISTORY: Allergies, hypertension, diabetes, congestive heart failure, osteoarthritis, chronic low back pain, chronic kidney disease, anxiety, claustrophobia, and fibromyalgia PAIN:  Are you having pain? Yes: NPRS scale: 3/10 Pain location: left knee Pain description: constant aching, throbbing, and burning  Aggravating factors: standing, walking, and moving her knee  Relieving factors: ice and medication   PRECAUTIONS: None  RED FLAGS: None   WEIGHT BEARING RESTRICTIONS: No  FALLS:  Has patient fallen in last 6 months? No  LIVING ENVIRONMENT: Lives with: lives with their family Lives in: House/apartment Stairs: Yes: Internal: 14 steps; on right going up and External: 1 steps; none Has following equipment at home: Dan Humphreys - 2 wheeled  OCCUPATION: retired  PLOF: Independent  PATIENT GOALS: be  able to paint the last two rooms of her house, be able to do her yard work, reduced pain, and improved mobility  NEXT MD VISIT: 06/20/23  OBJECTIVE: all objective measures were assessed at her initial evaluation on 04/27/23 unless otherwise noted  PATIENT SURVEYS:  FOTO 50.16 on 05/29/23  COGNITION: Overall cognitive status: Within functional limits for tasks assessed     SENSATION: Patient reports no numbness or tingling  EDEMA:  Circumferential: L tibiofemoral joint line: 53.3 cn R tibiofemoral joint line: 49.0 cm  POSTURE: forward head and flexed trunk   PALPATION: TTP: left quadriceps, hip adductors, hamstrings, medial and lateral tibiofemoral joint lines  LOWER EXTREMITY ROM:  Active ROM Right eval  Left eval 05/09/23 Left 05/29/23 Left 06/06/23  Hip flexion       Hip extension       Hip abduction       Hip adduction       Hip internal rotation       Hip external rotation       Knee flexion 133 39/ 43 (PROM) 68 degrees(passive) 61/ 75 (PROM)  73/ 69 (PROM; limited by pain)   Knee extension 0 20 -14 degrees (passive). 13 13  Ankle dorsiflexion       Ankle plantarflexion       Ankle inversion       Ankle eversion        (Blank rows = not tested)  LOWER EXTREMITY MMT: not assessed due to surgical condition  LOWER EXTREMITY SPECIAL TESTS:  Not tested due to surgical condition  GAIT: Assistive device utilized: Environmental consultant - 2 wheeled and Wheelchair (manual) Level of assistance: Modified independence and Total A Comments: Patient utilized a shuffling pattern with the FWW, but utilized for long distance mobility    TODAY'S TREATMENT:                                                                                                                              DATE:    07/13/23:                                     EXERCISE LOG   LT KNEE  Exercise Repetitions and Resistance Comments  Nustep Level 4 x 15 minutes   Rockerboard in parallel bars 5 minutes   LAQ's    14 in box lunge Flexion stretching x 20       PROM to left knee flexion and extension. PROM    14-80 degrees today   Vaso x 15 mins   to LT knee 07/06/23:                                     EXERCISE LOG   LT knee  Exercise Repetitions and Resistance Comments  Nustep Level 3  x 16 minutes   LAQ's 4# x 4 minutes   Rockerboard 4 minutes   14 in lunges for flexion 2x10 hold end-range       In supine:  PROM for left knee flexion and  extension progression.  Manual: PROM extension  14 degrees and flexion to 82 degrees                                     06/26/23 EXERCISE LOG    LT knee  Exercise Repetitions and Resistance Comments  Nustep L3 x 15 minutes; seat 8-7   Lunges onto step  12" step x 3 minutes  LLE on  step for knee flexion  Rocker board  5 minutes    Step up  6" step x 25 reps Leading with LLE   LAQ 3# x 3 mins LLE only   Seated Marches 3# x 3 mins BUE support   Seated Hip Abduction Red x 30 reps   Seated HS Curls Red x 30 reps    Blank cell = exercise not performed today                                        PATIENT EDUCATION:  Education details: HEP, plan of care, prognosis, healing, and goals for therapy Person educated: Patient Education method: Explanation Education comprehension: verbalized understanding  HOME EXERCISE PROGRAM: [id number]  ASSESSMENT:  CLINICAL IMPRESSION:  Patient arrived today doing about the same with LT knee. Rx's have focused on progressing ROM , but Pt still very limited and still with ROM deficits for Ext. And flexion. Limited to 14 degrees extension and 80 degrees flexion. Await MD F/U.   07/06/23 PROGRESS REPORT:  Patient has made minimal progress with skilled physical therapy as evidenced by her objective measures, functional mobility, and progress toward her goals. She was able to demonstrate a slight improvement in her right knee active range of motion since her manipulation on 06/05/23. However, she has yet to meet her short or long term goals for improved left knee mobility with pain and stiffness being her primary limitations. Request two addition visits until she follows up with her referring physician on 07/18/23.  Candi Leash, PT, DPT   OBJECTIVE IMPAIRMENTS: Abnormal gait, decreased activity tolerance, decreased mobility, difficulty walking, decreased ROM, decreased strength, hypomobility, increased edema, impaired tone, and pain.   ACTIVITY LIMITATIONS: carrying, lifting, sitting, standing, squatting, stairs, transfers, bed mobility, bathing, dressing, locomotion level, and caring for others  PARTICIPATION LIMITATIONS: meal prep, cleaning, laundry, driving, shopping, community activity, and yard work  PERSONAL FACTORS: Past/current  experiences, Transportation, and 3+ comorbidities: Allergies, hypertension, diabetes, congestive heart failure, osteoarthritis, chronic low back pain, chronic kidney disease, anxiety, claustrophobia, and fibromyalgia  are also affecting patient's functional outcome.   REHAB POTENTIAL: Good  CLINICAL DECISION MAKING: Evolving/moderate complexity  EVALUATION COMPLEXITY: Moderate   GOALS: Goals reviewed with patient? Yes  SHORT TERM GOALS: Target date: 05/18/23 Patient will be independent with her initial HEP. Baseline: Goal status: MET  2.  Patient will be able to demonstrate active left knee extension within 10 degrees of neutral for improved knee mobility.  Baseline:  Goal status: ON GOING    14 degrees  3.  Patient will be able to demonstrate active left knee flexion to at least 90 degrees for improved  function with transfers. Baseline:  Goal status: ON GOING    82 degrees  4.  Patient will be able to safely ambulate community distances with a walker with the least restrictive assistive device for improved community mobility. Baseline: utilizes a cane for mobility Goal status: MET  LONG TERM GOALS: Target date: 06/08/23  Patient will be independent with her advanced HEP. Baseline:  Goal status: MET  2.  Patient will be able to demonstrate active left knee extension within 5 degrees of neutral for improved gait mechanics. Baseline:  Goal status: ON GOING   14  3.  Patient will improve her active left knee flexion to at least 120 degrees for improved function navigating stairs. Baseline:  Goal status: ON GOING   80  4.  Patient will be able to safely ambulate with minimal to no gait deviations for improved mobility. Baseline:  Goal status: ON GOING  5.  Patient will be able to transfer from sitting to standing with minimal to no difficulty for improved household mobility. Baseline:  Goal status: MET  PLAN:  PT FREQUENCY: 2-3x/week  PT DURATION: 6 weeks  PLANNED  INTERVENTIONS: Therapeutic exercises, Therapeutic activity, Neuromuscular re-education, Balance training, Gait training, Patient/Family education, Self Care, Joint mobilization, Stair training, Electrical stimulation, Cryotherapy, Moist heat, Vasopneumatic device, Manual therapy, and Re-evaluation  PLAN FOR NEXT SESSION: Nustep, review and update HEP as needed, manual therapy, gait training, and modalities as needed   MD note. To MD Tuesday   Delfin Squillace,CHRIS, PTA 07/13/2023, 10:26 AM

## 2023-07-13 NOTE — Patient Instructions (Signed)
Wound Care Instructions: -Change dressing daily -May remove packing either before or while showering.  Ok to let water run overtop of the area.  Pat dry when get out of shower -Using a Q-tip, packing the 1/4 inch iodoform packing into the wound -Cover with gauze and tape

## 2023-07-14 ENCOUNTER — Ambulatory Visit (INDEPENDENT_AMBULATORY_CARE_PROVIDER_SITE_OTHER): Payer: Medicare Other

## 2023-07-14 NOTE — Progress Notes (Signed)
[facility] History and Physical  Reason for Referral: Buttock abscess Referring Physician: Dr. Mardelle Matte  Chief Complaint   New Patient (Initial Visit)     [name] is a 77 y.o. female.  HPI: Patient presents for evaluation of a buttock abscess.  Starting about 1 month ago she noted an increased swelling area on her buttock.  Since that time, she has received 2 separate courses of antibiotics, the first of which was for the abscess and the second of which was for a sinus infection.  About a week after the abscess came up, she noted some drainage from the area, which she describes as a bloody brown drainage.  She denies any other abscesses anywhere else.  She denies fevers and chills.  She will intermittently have pain when sitting on this abscess.  Her past medical history is significant for diabetes, hypertension, chronic kidney disease, and CHF.  She denies use of blood thinning medications.  She has a history of a tubal ligation and orthopedic surgeries.  She denies use of tobacco products, alcohol, and illicit drugs.  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) 08/24/2020   Diabetes mellitus without complication (HCC)    takes Trulicity,Jardiance,and Metformin daily.Average fasting blood sugar runs around130   Dysrhythmia    PVCs, has MVP   Fibromyalgia    GERD (gastroesophageal reflux disease)    takes Omeprazole daily   Gout 07/19/2021   On allopurinol   History of bronchitis > 8 yrs ago   History of shingles    HTN (hypertension)    takes Amlodipine and Micardis daily   Hx of colonic polyps    benign   Internal and external hemorrhoids without complication    Joint pain    Mitral valve prolapse    Neuromuscular disorder (HCC)    restless leg  syndrome   Nocturia    Pneumonia    PONV (postoperative nausea and vomiting)    when gets injections in joints gets hives.Betadine rash   Restless leg syndrome    takes Requip at bedtime   Seasonal allergies    takes Claritin daily as needed   Urinary frequency    Uterine fibroid     Past Surgical History:  Procedure Laterality Date   BUNIONECTOMY Bilateral    COLONOSCOPY  07/12/2004   diverticulosis, internal and external hemorrhoids   ETHMOIDECTOMY Bilateral 08/15/2022   Procedure: ETHMOIDECTOMY WITH TISSUE REMOVAL;  Surgeon: Newman Pies, MD;  Location: [facility];  Service: ENT;  Laterality: Bilateral;   FINGER ARTHROSCOPY WITH CARPOMETACARPEL (CMC) ARTHROPLASTY Right 09/28/2015   Procedure: RIGHT THUMB TRAPEZIUM EXCISION WITH CARPOMETACARPEL (CMC) ARTHROPLASTY AND TENDON TRANSFER;  Surgeon: Bradly Bienenstock, MD;  Location: [facility];  Service: Orthopedics;  Laterality: Right;   FRONTAL SINUS EXPLORATION Bilateral 08/15/2022   Procedure: FRONTAL RECESS SINUS EXPLORATION WITH NAVIGATION;  Surgeon: Newman Pies, MD;  Location: [facility];  Service: ENT;  Laterality: Bilateral;   HEMORRHOID SURGERY     almost 40 yrs ago   HYSTEROSCOPY WITH D & C  08/23/2000   and resectoscopic myomectomy   KNEE CLOSED REDUCTION Left 06/05/2023   Procedure: CLOSED MANIPULATION KNEE;  Surgeon: Ollen Gross, MD;  Location: [facility];  Service: Orthopedics;  Laterality: Left;  LUMBAR DISC SURGERY  03/16/2005   LUMBAR EPIDURAL INJECTION     MAXILLARY ANTROSTOMY Bilateral 08/15/2022   Procedure: ENDOSCOPIC MAXILLARY ANTROSTOMY WITH TISSUE REMOVAL;  Surgeon: Newman Pies, MD;  Location: [facility];  Service: ENT;  Laterality: Bilateral;   PLANTAR FASCIA RELEASE Left 02/03/2010   and torn tendon   REVERSE SHOULDER ARTHROPLASTY Right 12/14/2020   Procedure: REVERSE SHOULDER ARTHROPLASTY;  Surgeon: Beverely Low, MD;  Location: [facility];  Service: Orthopedics;  Laterality: Right;    RIGHT/LEFT HEART CATH AND CORONARY ANGIOGRAPHY N/A 09/07/2020   Procedure: RIGHT/LEFT HEART CATH AND CORONARY ANGIOGRAPHY;  Surgeon: Yvonne Kendall, MD;  Location: [facility];  Service: Cardiovascular;  Laterality: N/A;   SINUS ENDO W/FUSION Bilateral 08/15/2022   Procedure: SPHENOIDECTOMY WITH TISSUE REMOVAL WITH NAVIGATION;  Surgeon: Newman Pies, MD;  Location: [facility];  Service: ENT;  Laterality: Bilateral;   TENDON TRANSFER Right 09/28/2015   Procedure: TENDON TRANSFER;  Surgeon: Bradly Bienenstock, MD;  Location: [facility];  Service: Orthopedics;  Laterality: Right;   TONSILLECTOMY     TOTAL KNEE ARTHROPLASTY Left 04/24/2023   Procedure: TOTAL KNEE ARTHROPLASTY;  Surgeon: Ollen Gross, MD;  Location: [facility];  Service: Orthopedics;  Laterality: Left;   TUBAL LIGATION     TURBINATE REDUCTION Bilateral 08/15/2022   Procedure: TURBINATE REDUCTION;  Surgeon: Newman Pies, MD;  Location:  SUR[facility]ERY [facility];  Service: ENT;  Laterality: Bilateral;   WRIST SURGERY     left, removal of cyst    Family History  Problem Relation Age of Onset   Pancreatic cancer Father    Colon cancer Neg Hx    Breast cancer Neg Hx     Social History   Tobacco Use   Smoking status: Never   Smokeless tobacco: Never  Vaping Use   Vaping status: Never Used  Substance Use Topics   Alcohol use: No    Alcohol/week: 0.0 standard drinks of alcohol   Drug use: No    Medications: I have reviewed the patient's current medications. Allergies as of 07/13/2023       Reactions   Amlodipine Swelling   Amoxicillin-pot Clavulanate Hives   Depo-medrol [methylprednisolone] Hives   Gabapentin Rash   hives   Levothyroxine Other (See Comments)   severe muscle pain in neck   Sitagliptin Other (See Comments)   Urinary hesitancy Januvia   Amoxicillin Hives   Codeine Nausea And Vomiting   Corticosteroids Hives   Steroid injections   Tetracyclines & Related Nausea And Vomiting   Ace Inhibitors     cough   Dexamethasone Rash        Medication List        Accurate as of July 13, 2023 11:59 PM. If you have any questions, ask your nurse or doctor.          allopurinol 100 MG tablet Commonly known as: ZYLOPRIM TAKE 1 TABLET BY MOUTH EVERY DAY   Assure ID Safety Pen Needles 30G X 5 MM Misc Generic drug: Insulin Pen Needle USE AS DIRECTED   CAL-MAG-ZINC PO Take 1 tablet by mouth in the morning.   carvedilol 3.125 MG tablet Commonly known as: COREG Take 1 tablet (3.125 mg total) by mouth 2 (two) times daily.   cholecalciferol 1000 units tablet Commonly known as: VITAMIN D Take 1,000 Units by mouth in the morning.   cyanocobalamin 1000 MCG tablet Commonly known as: VITAMIN B12 Take 1,000 mcg by mouth in the morning.   dapagliflozin  propanediol 10 MG Tabs tablet Commonly known as: Farxiga Take 1 tablet (10 mg total) by mouth daily before breakfast.   Entresto 49-51 MG Generic drug: sacubitril-valsartan Take 1 tablet by mouth 2 (two) times daily.   famotidine 20 MG tablet Commonly known as: PEPCID Take 20 mg by mouth at bedtime.   fluticasone 50 MCG/ACT nasal spray Commonly known as: FLONASE SPRAY 1 SPRAY INTO BOTH NOSTRILS DAILY. What changed: See the new instructions.   FreeStyle Libre 3 Plus Sensor Misc Change sensor every 15 days.   furosemide 20 MG tablet Commonly known as: LASIX Take 1 tablet (20 mg total) by mouth as directed. Monday,wed, Friday and sat only.   Lantus SoloStar 100 UNIT/ML Solostar Pen Generic drug: insulin glargine Inject 25 Units into the skin daily.   metFORMIN 500 MG tablet Commonly known as: GLUCOPHAGE TAKE 1 TABLET BY MOUTH 2 TIMES DAILY WITH A MEAL.   methocarbamol 500 MG tablet Commonly known as: ROBAXIN Take 1 tablet (500 mg total) by mouth every 6 (six) hours as needed for muscle spasms.   montelukast 10 MG tablet Commonly known as: SINGULAIR TAKE 1 TABLET BY MOUTH EVERY DAY   multivitamin with minerals  Tabs tablet Take 1 tablet by mouth in the morning.   omeprazole 40 MG capsule Commonly known as: PRILOSEC TAKE 1 CAPSULE (40 MG TOTAL) BY MOUTH DAILY.   ondansetron 4 MG disintegrating tablet Commonly known as: ZOFRAN-ODT Take 1 tablet (4 mg total) by mouth every 8 (eight) hours as needed for nausea or vomiting.   ondansetron 4 MG tablet Commonly known as: ZOFRAN Take 1 tablet (4 mg total) by mouth every 6 (six) hours as needed for nausea.   pregabalin 100 MG capsule Commonly known as: LYRICA Take 1 capsule by mouth twice daily   promethazine 25 MG tablet Commonly known as: PHENERGAN Take 1 tablet (25 mg total) by mouth every 8 (eight) hours as needed for nausea or vomiting.   Refresh Optive Mega-3 0.5-1-0.5 % Soln Generic drug: Carboxymeth-Glyc-Polysorb PF Place 1-2 drops into both eyes 3 (three) times daily as needed (dry/irritated eyes.).   rOPINIRole 3 MG tablet Commonly known as: REQUIP TAKE 1 TABLET BY MOUTH AT BEDTIME.   rosuvastatin 20 MG tablet Commonly known as: CRESTOR Take 1 tablet (20 mg total) by mouth daily.   spironolactone 25 MG tablet Commonly known as: ALDACTONE Take 0.5 tablets (12.5 mg total) by mouth daily.   sulfamethoxazole-trimethoprim 800-160 MG tablet Commonly known as: BACTRIM DS Take 1 tablet by mouth 2 (two) times daily for 7 days. Started by: Lewie Chamber   traMADol 50 MG tablet Commonly known as: ULTRAM Take 1-2 tablets (50-100 mg total) by mouth every 6 (six) hours as needed for moderate pain (pain score 4-6).         ROS:  Constitutional: negative for chills, fatigue, and fevers Eyes: negative for visual disturbance and pain Ears, nose, mouth, throat, and face: positive for sinus problems, negative for ear drainage and sore throat Respiratory: negative for cough, wheezing, and shortness of breath Cardiovascular: negative for chest pain and palpitations Gastrointestinal: negative for abdominal pain, nausea, reflux  symptoms, and vomiting Genitourinary:negative for dysuria and frequency Integument/breast: negative for dryness and rash Hematologic/lymphatic: negative for bleeding and lymphadenopathy Musculoskeletal:positive for back pain and neck pain Neurological: negative for dizziness and tremors Endocrine: negative for temperature intolerance  Blood pressure (!) 161/75, pulse (!) 59, temperature (!) 97.5 F (36.4 C), temperature source Oral, resp. rate 12, height 5\' 4"  (  1.626 m), weight 191 lb (86.6 kg), SpO2 98%. Physical Exam Vitals reviewed.  HENT:     Head: Normocephalic and atraumatic.  Eyes:     Extraocular Movements: Extraocular movements intact.     Pupils: Pupils are equal, round, and reactive to light.  Cardiovascular:     Rate and Rhythm: Normal rate and regular rhythm.  Pulmonary:     Effort: Pulmonary effort is normal.     Breath sounds: Normal breath sounds.  Abdominal:     General: There is no distension.     Palpations: Abdomen is soft.     Tenderness: There is no abdominal tenderness.  Genitourinary:    Comments: Right buttock with enlarged area of induration with associated erythema and overlying wound, no overlying fluctuance, mild tenderness to palpation Musculoskeletal:        General: Normal range of motion.     Cervical back: Normal range of motion.  Skin:    General: Skin is warm and dry.  Neurological:     General: No focal deficit present.     Mental Status: She is alert and oriented to person, place, and time.  Psychiatric:        Mood and Affect: Mood normal.        Behavior: Behavior normal.     Results: No results found for this or any previous visit (from the past 48 hours).  No results found.   Assessment & Plan:  [name] is a 77 y.o. female with concern for buttock abscess.  -I discussed with the patient that while this area may be an abscess, it may be something else.  We discussed that I could give her a course of antibiotics, trying  to incise the area to see if anything will drain from it, or potentially excising the area.  I did discuss that excising the area at this time would put her at increased risk for wound breakdown and possible large wound in this area.  Patient has decided that she would like to attempt incision and drainage at bedside today to see if this area will improve after -The risk and benefits of incision and drainage of right buttock abscess were discussed including but not limited to bleeding, infection, and need for additional procedures.   -Please refer below for details of the procedure -I discussed with the patient that we will follow-up on the culture and pathology results -Prescription provided for Bactrim for 7 days -Patient will need to perform daily dressing changes at home.  She will pack the incision site with quarter inch iodoform packing, and cover with 4 x 4's and paper tape -Follow up with me next week for wound recheck  All questions were answered to the satisfaction of the patient.   Bedside procedure note   Preoperative Diagnosis: Concern for right gluteal abscess Postoperative Diagnosis: Right gluteal cellulitis with associated mass   Procedure(s) Performed: Incision and drainage of right gluteal abscess/cellulitis   Performing provider: Santina Evans [place], DO   Estimated Blood Loss: Minimal   Findings: Right gluteal cellulitis with associated mass versus phlegmon, specimen obtained and sent to pathology for evaluation   Procedure: At bedside, right buttock was prepped with Betadine and draped with OR towels.  Verbal and written consent obtained from the patient prior to beginning of procedure.  Timeout was performed.  Area was localized with lidocaine with 1% epinephrine.  Using scalpel, an incision was made over top of the wound overlying the area of induration.  There  was a small cavity with and without any evidence of purulent drainage.  Wound culture was obtained.  There was  a hardened thickened tissue within this cavity, so small amount was removed and sent to pathology for evaluation.  The area was irrigated and then packed with quarter inch iodoform packing.  The area was dressed with 4 x 4's and Medipore tape.  Patient tolerated the procedure without issue.   Theophilus Kinds, DO [facility] [address] [place] [place], [place] [address] [phone] (office)

## 2023-07-16 ENCOUNTER — Other Ambulatory Visit: Payer: Self-pay | Admitting: Family Medicine

## 2023-07-17 LAB — WOUND CULTURE
MICRO NUMBER:: 15847558
RESULT:: NO GROWTH
SPECIMEN QUALITY:: ADEQUATE

## 2023-07-19 ENCOUNTER — Encounter: Payer: Self-pay | Admitting: *Deleted

## 2023-07-19 ENCOUNTER — Ambulatory Visit (INDEPENDENT_AMBULATORY_CARE_PROVIDER_SITE_OTHER): Payer: Medicare Other | Admitting: Surgery

## 2023-07-19 VITALS — BP 126/70 | HR 62 | Temp 97.5°F | Resp 14 | Ht 64.0 in | Wt 192.0 lb

## 2023-07-19 DIAGNOSIS — R222 Localized swelling, mass and lump, trunk: Secondary | ICD-10-CM

## 2023-07-19 LAB — PATHOLOGY REPORT

## 2023-07-19 LAB — TISSUE SPECIMEN

## 2023-07-19 MED ORDER — SULFAMETHOXAZOLE-TRIMETHOPRIM 800-160 MG PO TABS: 1.0000 | ORAL_TABLET | Freq: Two times a day (BID) | ORAL | 0 refills | Status: AC

## 2023-07-19 NOTE — Progress Notes (Unsigned)
[facility] Note   HPI:  77 y.o. Female presents to clinic for follow up status post incision and drainage of right buttock mass on 12/12.  Patient states that she has been doing well since this visit.  She feels that the area has slightly improved.  She denies significant drainage, and her daughter has been doing dressing changes.  She has 1 more day left of her antibiotics.  She denies fevers and chills.  Review of Systems:  All other review of systems: otherwise negative   Vital Signs:  BP 126/70   Pulse 62   Temp (!) 97.5 F (36.4 C) (Oral)   Resp 14   Ht 5\' 4"  (1.626 m)   Wt 192 lb (87.1 kg)   SpO2 94%   BMI 32.96 kg/m    Physical Exam:  Physical Exam Vitals reviewed.  Constitutional:      Appearance: Normal appearance.  Genitourinary:    Comments: Right buttock wound with surrounding erythema and induration, minimally tender to palpation, and granulation tissue at base of wound Neurological:     Mental Status: She is alert.     Laboratory studies: None   Imaging:  None  Culture:    Component Ref Range & Units (hover) 7 d ago  MICRO NUMBER: [id number]  SPECIMEN QUALITY: Adequate  SOURCE: RIGHT BUTTOCK\  STATUS: FINAL  GRAM STAIN: No organisms or white blood cells seen No epithelial cells seen  RESULT: No Growth    Pathology:  A Diagnosis  [facility]  Comment: Features of traumatic fat necrosis with mixed inflammation, features of cellulitis, and mild atypia, see comment . Comment: The atypia appears reactive in the setting of the inflammatory and necrotic findings.  After initial review of H&E stains, a PAS, GMS and AFB stains with appropriate controls was performed in order to rule out a infection.  These stains are negative. Deeper sections have been reviewed.  If there is a clinical concern for an atypical soft tissue neoplasm, additional (viable) tissue with less inflammation  and necrosis is recommended.  Dr. Annabell Sabal also viewed this case with agreement.    Assessment:  77 y.o. yo Female who presents for follow-up status post incision and drainage of right buttock mass on 12/12  Plan:  -Patient feels that she is improving, though her exam has not changed significantly since last week -Given the area is still indurated and erythematous with the pathology demonstrating mild atypia, I feel that this area will need to be fully excised to rule out any possible malignancy -The risk and benefits of right gluteal mass excision were discussed with the patient, including but not limited to bleeding, infection, injury to surrounding structures, need for additional procedures, and poor wound healing.  After careful consideration, [name] has decided to proceed with surgery. -Patient tentatively scheduled for surgery on 08/08/2023 -Prescription for Bactrim was extended out for an additional 2 weeks to decrease the risk of this area getting further inflamed between now and the excision -Advised that her and her daughter should continue with daily dressing changes until surgery  All of the above recommendations were discussed with the patient, and all of patient's questions were answered to her expressed satisfaction.  Theophilus Kinds, DO [facility] [address] [address] [place], [place] [address] [phone] (office)

## 2023-07-20 ENCOUNTER — Other Ambulatory Visit (HOSPITAL_COMMUNITY): Payer: Self-pay

## 2023-07-20 ENCOUNTER — Telehealth: Payer: Self-pay | Admitting: *Deleted

## 2023-07-20 NOTE — Telephone Encounter (Signed)
Not covered under Medicare Part D-may be covered under Medicare Part B

## 2023-07-20 NOTE — Telephone Encounter (Signed)
   Pre-operative Risk Assessment    Patient Name: [name]  DOB: 05-21-1946 MRN: [id number]      Request for Surgical Clearance    Procedure:   EXCISION SOFT TISSUE MASS RIGHT BUTTOCKS  Date of Surgery:  Clearance 08/08/23                                 Surgeon:  Theophilus Kinds, DO Surgeon's Group or Practice Name:  [facility] Phone number:  [phone] Fax number:  [phone]   Type of Clearance Requested:   - Pharmacy:  Hold FARXIGA  X'S 3 DAYS   Type of Anesthesia:   CHOICE   Additional requests/questions:    [name]   07/20/2023, 7:48 AM

## 2023-07-20 NOTE — Telephone Encounter (Signed)
   Patient Name: KIWANDA ARAMBULA  DOB: 1945-10-08 MRN: [id number]  Primary Cardiologist: Meriam Sprague, MD (Inactive)  Chart reviewed as part of pre-operative protocol coverage. Given past medical history and time since last visit, based on ACC/AHA guidelines, NENA MAXSON is at acceptable risk for the planned procedure without further cardiovascular testing.   Patient should hold Farxiga 3 days prior to procedure.  The patient was advised that if she develops new symptoms prior to surgery to contact our office to arrange for a follow-up visit, and she verbalized understanding.  I will route this recommendation to the requesting party via Epic fax function and remove from pre-op pool.  Please call with questions.  Napoleon Form, Leodis Rains, NP 07/20/2023, 8:12 AM

## 2023-07-21 ENCOUNTER — Other Ambulatory Visit: Payer: Self-pay

## 2023-07-21 MED ORDER — FREESTYLE LIBRE 3 PLUS SENSOR MISC: 11 refills | Status: DC

## 2023-07-21 NOTE — Telephone Encounter (Signed)
Sensors has been sent to pharmacy

## 2023-07-24 ENCOUNTER — Other Ambulatory Visit: Payer: Self-pay | Admitting: Family Medicine

## 2023-07-31 NOTE — Patient Instructions (Signed)
 Your procedure is scheduled on: 08/08/2023  Report to [facility] Main Entrance at    6:00 AM.  Call this number if you have problems the morning of surgery: [phone]   Remember:   Do not Eat or Drink after midnight         No Smoking the morning of surgery  :  Take these medicines the morning of surgery with A SIP OF WATER : Carvedilol , pepcid , omeprazole , singulair , and lyrica   No diabetic medication am of procedure   Do not wear jewelry, make-up or nail polish.  Do not wear lotions, powders, or perfumes. You may wear deodorant.  Do not shave 48 hours prior to surgery. Men may shave face and neck.  Do not bring valuables to the hospital.  Contacts, dentures or bridgework may not be worn into surgery.  Leave suitcase in the car. After surgery it may be brought to your room.  For patients admitted to the hospital, checkout time is 11:00 AM the day of discharge.   Patients discharged the day of surgery will not be allowed to drive home.    Special Instructions: Shower using CHG night before surgery and shower the day of surgery use CHG.  Use special wash - you have one bottle of CHG for all showers.  You should use approximately 1/2 of the bottle for each shower. How to Use Chlorhexidine  Before Surgery Chlorhexidine  gluconate (CHG) is a germ-killing (antiseptic) solution that is used to clean the skin. It can get rid of the bacteria that normally live on the skin and can keep them away for about 24 hours. To clean your skin with CHG, you may be given: A CHG solution to use in the shower or as part of a sponge bath. A prepackaged cloth that contains CHG. Cleaning your skin with CHG may help lower the risk for infection: While you are staying in the intensive care unit of the hospital. If you have a vascular access, such as a central line, to provide short-term or long-term access to your veins. If you have a catheter to drain urine from your bladder. If you are on a ventilator. A  ventilator is a machine that helps you breathe by moving air in and out of your lungs. After surgery. What are the risks? Risks of using CHG include: A skin reaction. Hearing loss, if CHG gets in your ears and you have a perforated eardrum. Eye injury, if CHG gets in your eyes and is not rinsed out. The CHG product catching fire. Make sure that you avoid smoking and flames after applying CHG to your skin. Do not use CHG: If you have a chlorhexidine  allergy or have previously reacted to chlorhexidine . On babies younger than 86 months of age. How to use CHG solution Use CHG only as told by your health care provider, and follow the instructions on the label. Use the full amount of CHG as directed. Usually, this is one bottle. During a shower Follow these steps when using CHG solution during a shower (unless your health care provider gives you different instructions): Start the shower. Use your normal soap and shampoo to wash your face and hair. Turn off the shower or move out of the shower stream. Pour the CHG onto a clean washcloth. Do not use any type of brush or rough-edged sponge. Starting at your neck, lather your body down to your toes. Make sure you follow these instructions: If you will be having surgery, pay special attention to the  part of your body where you will be having surgery. Scrub this area for at least 1 minute. Do not use CHG on your head or face. If the solution gets into your ears or eyes, rinse them well with water . Avoid your genital area. Avoid any areas of skin that have broken skin, cuts, or scrapes. Scrub your back and under your arms. Make sure to wash skin folds. Let the lather sit on your skin for 1-2 minutes or as long as told by your health care provider. Thoroughly rinse your entire body in the shower. Make sure that all body creases and crevices are rinsed well. Dry off with a clean towel. Do not put any substances on your body afterward--such as powder,  lotion, or perfume--unless you are told to do so by your health care provider. Only use lotions that are recommended by the manufacturer. Put on clean clothes or pajamas. If it is the night before your surgery, sleep in clean sheets.  During a sponge bath Follow these steps when using CHG solution during a sponge bath (unless your health care provider gives you different instructions): Use your normal soap and shampoo to wash your face and hair. Pour the CHG onto a clean washcloth. Starting at your neck, lather your body down to your toes. Make sure you follow these instructions: If you will be having surgery, pay special attention to the part of your body where you will be having surgery. Scrub this area for at least 1 minute. Do not use CHG on your head or face. If the solution gets into your ears or eyes, rinse them well with water . Avoid your genital area. Avoid any areas of skin that have broken skin, cuts, or scrapes. Scrub your back and under your arms. Make sure to wash skin folds. Let the lather sit on your skin for 1-2 minutes or as long as told by your health care provider. Using a different clean, wet washcloth, thoroughly rinse your entire body. Make sure that all body creases and crevices are rinsed well. Dry off with a clean towel. Do not put any substances on your body afterward--such as powder, lotion, or perfume--unless you are told to do so by your health care provider. Only use lotions that are recommended by the manufacturer. Put on clean clothes or pajamas. If it is the night before your surgery, sleep in clean sheets. How to use CHG prepackaged cloths Only use CHG cloths as told by your health care provider, and follow the instructions on the label. Use the CHG cloth on clean, dry skin. Do not use the CHG cloth on your head or face unless your health care provider tells you to. When washing with the CHG cloth: Avoid your genital area. Avoid any areas of skin that have  broken skin, cuts, or scrapes. Before surgery Follow these steps when using a CHG cloth to clean before surgery (unless your health care provider gives you different instructions): Using the CHG cloth, vigorously scrub the part of your body where you will be having surgery. Scrub using a back-and-forth motion for 3 minutes. The area on your body should be completely wet with CHG when you are done scrubbing. Do not rinse. Discard the cloth and let the area air-dry. Do not put any substances on the area afterward, such as powder, lotion, or perfume. Put on clean clothes or pajamas. If it is the night before your surgery, sleep in clean sheets.  For general bathing Follow these steps when  using CHG cloths for general bathing (unless your health care provider gives you different instructions). Use a separate CHG cloth for each area of your body. Make sure you wash between any folds of skin and between your fingers and toes. Wash your body in the following order, switching to a new cloth after each step: The front of your neck, shoulders, and chest. Both of your arms, under your arms, and your hands. Your stomach and groin area, avoiding the genitals. Your right leg and foot. Your left leg and foot. The back of your neck, your back, and your buttocks. Do not rinse. Discard the cloth and let the area air-dry. Do not put any substances on your body afterward--such as powder, lotion, or perfume--unless you are told to do so by your health care provider. Only use lotions that are recommended by the manufacturer. Put on clean clothes or pajamas. Contact a health care provider if: Your skin gets irritated after scrubbing. You have questions about using your solution or cloth. You swallow any chlorhexidine . Call your local poison control center [phone] in the [place] Get help right away if: Your eyes itch badly, or they become very red or swollen. Your skin itches badly and is red or  swollen. Your hearing changes. You have trouble seeing. You have swelling or tingling in your mouth or throat. You have trouble breathing. These symptoms may represent a serious problem that is an emergency. Do not wait to see if the symptoms will go away. Get medical help right away. Call your local emergency services (911 in the [place] Do not drive yourself to the hospital. Summary Chlorhexidine  gluconate (CHG) is a germ-killing (antiseptic) solution that is used to clean the skin. Cleaning your skin with CHG may help to lower your risk for infection. You may be given CHG to use for bathing. It may be in a bottle or in a prepackaged cloth to use on your skin. Carefully follow your health care provider's instructions and the instructions on the product label. Do not use CHG if you have a chlorhexidine  allergy. Contact your health care provider if your skin gets irritated after scrubbing. This information is not intended to replace advice given to you by your health care provider. Make sure you discuss any questions you have with your health care provider. Document Revised: 11/15/2021 Document Reviewed: 09/28/2020 Elsevier Patient Education  2023 [employer] Sutured Wound Care Sutures are stitches that can be used to close wounds. Some stitches break down as they heal (absorbable). Other stitches need to be taken out by your doctor (nonabsorbable). Taking good care of your wound can help to prevent pain and infection. It can also help your wound heal more quickly. Follow instructions from your doctor about how to care for your sutured wound. Supplies needed: Soap and water . A clean, dry towel. Solution to clean your wound, if needed. A clean gauze or bandage (dressing), if needed. Antibiotic ointment, if told by your doctor. How to care for your sutured wound  Keep the wound fully dry for the first 24 hours or as long as told by your doctor. After 24-48 hours, you may shower or bathe as  told by your doctor. Do not soak the wound or put the wound under water  until the stitches have been taken out. After the first 24 hours, clean the wound once a day, or as often as your doctor tells you to. Take these steps: Wash and rinse the wound as told by your health care  provider. Pat the wound dry with a clean towel. Do not rub the wound. After cleaning the wound, put a thin layer of antibiotic ointment on the wound as told by your doctor. This will help: Prevent infection. Keep the bandage from sticking to the wound. Follow instructions from your doctor about how to change your bandage. Make sure you: Wash your hands with soap and water  for at least 20 seconds. If you cannot use soap and water , use hand sanitizer. Change your bandage at least once a day, or as often as told by your doctor. If your dressing gets wet or dirty, change it. Leavestitches in place for at least 2 weeks. If you have skin glue over your stitches, this should also stay in place for at least 2 weeks. Leave tape strips alone (if you have them) unless you are told to take them off. You may trim the edges of the tape strips if they curl up. Check your wound every day for signs of infection. Watch for: Redness, swelling, or pain. Fluid or blood. New warmth, a rash, or hardness at the wound site. Pus or a bad smell. Have the stitches taken out as told by your doctor. Follow these instructions at home: Medicines Take or apply over-the-counter and prescription medicines only as told by your doctor. If you were prescribed an antibiotic medicine or ointment, take or apply it as told by your doctor. Do not stop using the antibiotic even if you start to feel better. General instructions Cover your wound with clothes or put sunscreen on when you are outside. Use a sunscreen of at least 30 SPF. Do not scratch or pick at your wound. Avoid stretching your wound. Raise the injured area above the level of your heart while you  are sitting or lying down, if possible. Eat a diet that includes protein, vitamin A, and vitamin C. Doing this will help your wound heal. Drink enough fluid to keep your pee (urine) pale yellow. Keep all follow-up visits. Contact a doctor if: You were given a tetanus shot and you have any of the following at the site where the needle went in: Swelling. Very bad pain. Redness. Bleeding. Your wound breaks open. You see something coming out of your wound, such as wood or glass. You have any of these signs of infection in or around your wound: Redness, swelling, or pain. Fluid or blood. Warmth. A new rash. Your wound feels hard. You have a fever. The skin near your wound changes color. You have pain that does not get better with medicine. You get numbness around the wound. Get help right away if: You have very bad swelling or more pain around your wound. You have pus or a bad smell coming from your wound. You have painful lumps near your wound or anywhere on your body. You have a red streak going away from your wound. The wound is on your hand or foot, and: Your fingers or toes look pale or blue. You cannot move a finger or toe as you used to do. You have numbness that spreads down your hand, foot, fingers, or toes. Summary Sutures are stitches that are used to close wounds. Taking good care of your wound can help to prevent pain and infection. Keep the wound fully dry for the first 24 hours or for as long as told by your doctor. After 24-48 hours, you may shower or bathe as told by your doctor. This information is not intended to replace advice given  to you by your health care provider. Make sure you discuss any questions you have with your health care provider. Document Revised: 11/23/2020 Document Reviewed: 11/23/2020 Elsevier Patient Education  2024 Else[employer] Inc. General Anesthesia, Adult, Care After The following information offers guidance on how to care for yourself after  your procedure. Your health care provider may also give you more specific instructions. If you have problems or questions, contact your health care provider. What can I expect after the procedure? After the procedure, it is common for people to: Have pain or discomfort at the IV site. Have nausea or vomiting. Have a sore throat or hoarseness. Have trouble concentrating. Feel cold or chills. Feel weak, sleepy, or tired (fatigue). Have soreness and body aches. These can affect parts of the body that were not involved in surgery. Follow these instructions at home: For the time period you were told by your health care provider:  Rest. Do not participate in activities where you could fall or become injured. Do not drive or use machinery. Do not drink alcohol. Do not take sleeping pills or medicines that cause drowsiness. Do not make important decisions or sign legal documents. Do not take care of children on your own. General instructions Drink enough fluid to keep your urine pale yellow. If you have sleep apnea, surgery and certain medicines can increase your risk for breathing problems. Follow instructions from your health care provider about wearing your sleep device: Anytime you are sleeping, including during daytime naps. While taking prescription pain medicines, sleeping medicines, or medicines that make you drowsy. Return to your normal activities as told by your health care provider. Ask your health care provider what activities are safe for you. Take over-the-counter and prescription medicines only as told by your health care provider. Do not use any products that contain nicotine or tobacco. These products include cigarettes, chewing tobacco, and vaping devices, such as e-cigarettes. These can delay incision healing after surgery. If you need help quitting, ask your health care provider. Contact a health care provider if: You have nausea or vomiting that does not get better with  medicine. You vomit every time you eat or drink. You have pain that does not get better with medicine. You cannot urinate or have bloody urine. You develop a skin rash. You have a fever. Get help right away if: You have trouble breathing. You have chest pain. You vomit blood. These symptoms may be an emergency. Get help right away. Call 911. Do not wait to see if the symptoms will go away. Do not drive yourself to the hospital. Summary After the procedure, it is common to have a sore throat, hoarseness, nausea, vomiting, or to feel weak, sleepy, or fatigue. For the time period you were told by your health care provider, do not drive or use machinery. Get help right away if you have difficulty breathing, have chest pain, or vomit blood. These symptoms may be an emergency. This information is not intended to replace advice given to you by your health care provider. Make sure you discuss any questions you have with your health care provider. Document Revised: 10/15/2021 Document Reviewed: 10/15/2021 Elsevier Patient Education  2024 [employer].

## 2023-08-03 ENCOUNTER — Encounter (HOSPITAL_COMMUNITY): Payer: Self-pay

## 2023-08-03 ENCOUNTER — Encounter (HOSPITAL_COMMUNITY)
Admission: RE | Admit: 2023-08-03 | Discharge: 2023-08-03 | Disposition: A | Discharge status: Home / Self Care | Payer: Medicare Other | Source: Ambulatory Visit | Attending: Surgery | Admitting: Surgery

## 2023-08-03 VITALS — BP 126/70 | HR 62 | Temp 97.5°F | Resp 18 | Ht 64.0 in | Wt 192.0 lb

## 2023-08-03 DIAGNOSIS — Z01812 Encounter for preprocedural laboratory examination: Secondary | ICD-10-CM | POA: Diagnosis present

## 2023-08-03 DIAGNOSIS — N1831 Chronic kidney disease, stage 3a: Secondary | ICD-10-CM

## 2023-08-03 DIAGNOSIS — D649 Anemia, unspecified: Secondary | ICD-10-CM | POA: Diagnosis not present

## 2023-08-03 DIAGNOSIS — E1142 Type 2 diabetes mellitus with diabetic polyneuropathy: Secondary | ICD-10-CM

## 2023-08-03 DIAGNOSIS — Z01818 Encounter for other preprocedural examination: Secondary | ICD-10-CM

## 2023-08-03 LAB — CBC WITH DIFFERENTIAL/PLATELET
Abs Immature Granulocytes: 0.01 10*3/uL (ref 0.00–0.07)
Basophils Absolute: 0.1 10*3/uL (ref 0.0–0.1)
Basophils Relative: 1 %
Eosinophils Absolute: 0.4 10*3/uL (ref 0.0–0.5)
Eosinophils Relative: 8 %
HCT: 36.2 % (ref 36.0–46.0)
Hemoglobin: 11.2 g/dL — ABNORMAL LOW (ref 12.0–15.0)
Immature Granulocytes: 0 %
Lymphocytes Relative: 29 %
Lymphs Abs: 1.6 10*3/uL (ref 0.7–4.0)
MCH: 26.8 pg (ref 26.0–34.0)
MCHC: 30.9 g/dL (ref 30.0–36.0)
MCV: 86.6 fL (ref 80.0–100.0)
Monocytes Absolute: 0.6 10*3/uL (ref 0.1–1.0)
Monocytes Relative: 10 %
Neutro Abs: 2.9 10*3/uL (ref 1.7–7.7)
Neutrophils Relative %: 52 %
Platelets: 270 10*3/uL (ref 150–400)
RBC: 4.18 MIL/uL (ref 3.87–5.11)
RDW: 16.5 % — ABNORMAL HIGH (ref 11.5–15.5)
WBC: 5.6 10*3/uL (ref 4.0–10.5)
nRBC: 0 % (ref 0.0–0.2)

## 2023-08-03 LAB — BASIC METABOLIC PANEL
Anion gap: 9 (ref 5–15)
BUN: 38 mg/dL — ABNORMAL HIGH (ref 8–23)
CO2: 22 mmol/L (ref 22–32)
Calcium: 9.3 mg/dL (ref 8.9–10.3)
Chloride: 104 mmol/L (ref 98–111)
Creatinine, Ser: 1.94 mg/dL — ABNORMAL HIGH (ref 0.44–1.00)
GFR, Estimated: 26 mL/min — ABNORMAL LOW (ref >60)
Glucose, Bld: 104 mg/dL — ABNORMAL HIGH (ref 70–99)
Potassium: 5.7 mmol/L — ABNORMAL HIGH (ref 3.5–5.1)
Sodium: 135 mmol/L (ref 135–145)

## 2023-08-08 ENCOUNTER — Ambulatory Visit (HOSPITAL_COMMUNITY): Payer: Medicare Other | Admitting: Certified Registered Nurse Anesthetist

## 2023-08-08 ENCOUNTER — Encounter (HOSPITAL_COMMUNITY): Payer: Self-pay | Admitting: Surgery

## 2023-08-08 ENCOUNTER — Ambulatory Visit (HOSPITAL_BASED_OUTPATIENT_CLINIC_OR_DEPARTMENT_OTHER): Payer: Self-pay | Admitting: Certified Registered Nurse Anesthetist

## 2023-08-08 ENCOUNTER — Encounter (HOSPITAL_COMMUNITY)
Admission: RE | Disposition: A | Discharge status: Home / Self Care | Payer: Self-pay | Source: Ambulatory Visit | Attending: Surgery

## 2023-08-08 ENCOUNTER — Ambulatory Visit (HOSPITAL_COMMUNITY)
Admission: RE | Admit: 2023-08-08 | Discharge: 2023-08-08 | Disposition: A | Discharge status: Home / Self Care | Payer: Medicare Other | Source: Ambulatory Visit | Attending: Surgery | Admitting: Surgery

## 2023-08-08 ENCOUNTER — Other Ambulatory Visit: Payer: Self-pay

## 2023-08-08 DIAGNOSIS — I5022 Chronic systolic (congestive) heart failure: Secondary | ICD-10-CM | POA: Insufficient documentation

## 2023-08-08 DIAGNOSIS — R222 Localized swelling, mass and lump, trunk: Secondary | ICD-10-CM

## 2023-08-08 DIAGNOSIS — L0231 Cutaneous abscess of buttock: Secondary | ICD-10-CM | POA: Insufficient documentation

## 2023-08-08 DIAGNOSIS — N1831 Chronic kidney disease, stage 3a: Secondary | ICD-10-CM | POA: Insufficient documentation

## 2023-08-08 DIAGNOSIS — E1122 Type 2 diabetes mellitus with diabetic chronic kidney disease: Secondary | ICD-10-CM | POA: Diagnosis not present

## 2023-08-08 DIAGNOSIS — I130 Hypertensive heart and chronic kidney disease with heart failure and stage 1 through stage 4 chronic kidney disease, or unspecified chronic kidney disease: Secondary | ICD-10-CM | POA: Insufficient documentation

## 2023-08-08 DIAGNOSIS — R2241 Localized swelling, mass and lump, right lower limb: Secondary | ICD-10-CM

## 2023-08-08 HISTORY — PX: MASS EXCISION: SHX2000

## 2023-08-08 LAB — POCT I-STAT, CHEM 8
BUN: 34 mg/dL — ABNORMAL HIGH (ref 8–23)
Calcium, Ion: 1.27 mmol/L (ref 1.15–1.40)
Chloride: 106 mmol/L (ref 98–111)
Creatinine, Ser: 1.3 mg/dL — ABNORMAL HIGH (ref 0.44–1.00)
Glucose, Bld: 115 mg/dL — ABNORMAL HIGH (ref 70–99)
HCT: 29 % — ABNORMAL LOW (ref 36.0–46.0)
Hemoglobin: 9.9 g/dL — ABNORMAL LOW (ref 12.0–15.0)
Potassium: 4.3 mmol/L (ref 3.5–5.1)
Sodium: 140 mmol/L (ref 135–145)
TCO2: 22 mmol/L (ref 22–32)

## 2023-08-08 LAB — GLUCOSE, CAPILLARY
Glucose-Capillary: 105 mg/dL — ABNORMAL HIGH (ref 70–99)
Glucose-Capillary: 106 mg/dL — ABNORMAL HIGH (ref 70–99)

## 2023-08-08 SURGERY — EXCISION MASS
Anesthesia: General | Site: Buttocks | Laterality: Right

## 2023-08-08 MED ORDER — EPHEDRINE SULFATE-NACL 50-0.9 MG/10ML-% IV SOSY
PREFILLED_SYRINGE | INTRAVENOUS | Status: DC | PRN
  Administered 2023-08-08 (×4): 5 mg via INTRAVENOUS

## 2023-08-08 MED ORDER — OXYCODONE HCL 5 MG PO TABS: 5.0000 mg | ORAL_TABLET | Freq: Four times a day (QID) | ORAL | 0 refills | Status: DC | PRN

## 2023-08-08 MED ORDER — LIDOCAINE HCL (CARDIAC) PF 100 MG/5ML IV SOSY
PREFILLED_SYRINGE | INTRAVENOUS | Status: DC | PRN
  Administered 2023-08-08: 60 mg via INTRAVENOUS

## 2023-08-08 MED ORDER — LACTATED RINGERS IV SOLN: INTRAVENOUS | Status: DC

## 2023-08-08 MED ORDER — FENTANYL CITRATE (PF) 250 MCG/5ML IJ SOLN
INTRAMUSCULAR | Status: DC | PRN
  Administered 2023-08-08 (×2): 50 ug via INTRAVENOUS

## 2023-08-08 MED ORDER — PROPOFOL 10 MG/ML IV BOLUS
INTRAVENOUS | Status: AC
Start: 2023-08-08 — End: ?
  Filled 2023-08-08: qty 20

## 2023-08-08 MED ORDER — SEVOFLURANE IN SOLN
RESPIRATORY_TRACT | Status: AC
  Filled 2023-08-08: qty 250

## 2023-08-08 MED ORDER — LIDOCAINE HCL (PF) 2 % IJ SOLN
INTRAMUSCULAR | Status: AC
  Filled 2023-08-08: qty 5

## 2023-08-08 MED ORDER — ONDANSETRON HCL 4 MG PO TABS: 4.0000 mg | ORAL_TABLET | Freq: Every day | ORAL | 1 refills | Status: DC | PRN

## 2023-08-08 MED ORDER — 0.9 % SODIUM CHLORIDE (POUR BTL) OPTIME
TOPICAL | Status: DC | PRN
  Administered 2023-08-08: 1000 mL

## 2023-08-08 MED ORDER — ROCURONIUM BROMIDE 10 MG/ML (PF) SYRINGE
PREFILLED_SYRINGE | INTRAVENOUS | Status: DC | PRN
  Administered 2023-08-08: 50 mg via INTRAVENOUS

## 2023-08-08 MED ORDER — BUPIVACAINE HCL (PF) 0.5 % IJ SOLN
INTRAMUSCULAR | Status: AC
Start: 2023-08-08 — End: ?
  Filled 2023-08-08: qty 30

## 2023-08-08 MED ORDER — ROCURONIUM BROMIDE 10 MG/ML (PF) SYRINGE
PREFILLED_SYRINGE | INTRAVENOUS | Status: AC
  Filled 2023-08-08: qty 10

## 2023-08-08 MED ORDER — PHENYLEPHRINE 80 MCG/ML (10ML) SYRINGE FOR IV PUSH (FOR BLOOD PRESSURE SUPPORT)
PREFILLED_SYRINGE | INTRAVENOUS | Status: AC
  Filled 2023-08-08: qty 10

## 2023-08-08 MED ORDER — SUGAMMADEX SODIUM 200 MG/2ML IV SOLN
INTRAVENOUS | Status: DC | PRN
  Administered 2023-08-08: 200 mg via INTRAVENOUS

## 2023-08-08 MED ORDER — CLINDAMYCIN PHOSPHATE 900 MG/50ML IV SOLN
900.0000 mg | INTRAVENOUS | Status: AC
  Administered 2023-08-08: 900 mg via INTRAVENOUS
  Filled 2023-08-08: qty 50

## 2023-08-08 MED ORDER — ONDANSETRON HCL 4 MG/2ML IJ SOLN
INTRAMUSCULAR | Status: AC
  Filled 2023-08-08: qty 2

## 2023-08-08 MED ORDER — CHLORHEXIDINE GLUCONATE 0.12 % MT SOLN
15.0000 mL | Freq: Once | OROMUCOSAL | Status: AC
  Administered 2023-08-08: 15 mL via OROMUCOSAL

## 2023-08-08 MED ORDER — CHLORHEXIDINE GLUCONATE CLOTH 2 % EX PADS
6.0000 | MEDICATED_PAD | Freq: Once | CUTANEOUS | Status: DC
Start: 2023-08-08 — End: 2023-08-08

## 2023-08-08 MED ORDER — ONDANSETRON HCL 4 MG/2ML IJ SOLN
INTRAMUSCULAR | Status: DC | PRN
  Administered 2023-08-08: 4 mg via INTRAVENOUS

## 2023-08-08 MED ORDER — BUPIVACAINE HCL (PF) 0.5 % IJ SOLN
INTRAMUSCULAR | Status: DC | PRN
  Administered 2023-08-08: 30 mL

## 2023-08-08 MED ORDER — ACETAMINOPHEN 500 MG PO TABS
1000.0000 mg | ORAL_TABLET | Freq: Four times a day (QID) | ORAL | 0 refills | Status: AC
Start: 2023-08-08 — End: 2023-08-15

## 2023-08-08 MED ORDER — PROPOFOL 10 MG/ML IV BOLUS
INTRAVENOUS | Status: DC | PRN
  Administered 2023-08-08: 150 mg via INTRAVENOUS

## 2023-08-08 MED ORDER — DOCUSATE SODIUM 100 MG PO CAPS: 100.0000 mg | ORAL_CAPSULE | Freq: Two times a day (BID) | ORAL | 2 refills | Status: DC

## 2023-08-08 MED ORDER — PHENYLEPHRINE 80 MCG/ML (10ML) SYRINGE FOR IV PUSH (FOR BLOOD PRESSURE SUPPORT)
PREFILLED_SYRINGE | INTRAVENOUS | Status: DC | PRN
  Administered 2023-08-08 (×6): 80 ug via INTRAVENOUS

## 2023-08-08 MED ORDER — ORAL CARE MOUTH RINSE: 15.0000 mL | Freq: Once | OROMUCOSAL | Status: AC

## 2023-08-08 MED ORDER — FENTANYL CITRATE (PF) 100 MCG/2ML IJ SOLN
INTRAMUSCULAR | Status: AC
  Filled 2023-08-08: qty 2

## 2023-08-08 MED ORDER — CHLORHEXIDINE GLUCONATE CLOTH 2 % EX PADS: 6.0000 | MEDICATED_PAD | Freq: Once | CUTANEOUS | Status: DC

## 2023-08-08 SURGICAL SUPPLY — 30 items
CLOTH BEACON ORANGE TIMEOUT ST (SAFETY) ×1 IMPLANT
COVER LIGHT HANDLE STERIS (MISCELLANEOUS) ×2 IMPLANT
ELECT REM PT RETURN 9FT ADLT (ELECTROSURGICAL) ×1
ELECTRODE REM PT RTRN 9FT ADLT (ELECTROSURGICAL) ×1 IMPLANT
GAUZE SPONGE 4X4 12PLY STRL (GAUZE/BANDAGES/DRESSINGS) IMPLANT
GAUZE XEROFORM 1X8 LF (GAUZE/BANDAGES/DRESSINGS) IMPLANT
GLOVE BIO SURGEON STRL SZ 6.5 (GLOVE) IMPLANT
GLOVE BIO SURGEON STRL SZ7 (GLOVE) IMPLANT
GLOVE BIOGEL PI IND STRL 6.5 (GLOVE) ×1 IMPLANT
GLOVE BIOGEL PI IND STRL 7.0 (GLOVE) ×2 IMPLANT
GLOVE BIOGEL PI IND STRL 7.5 (GLOVE) IMPLANT
GLOVE SURG SS PI 6.5 STRL IVOR (GLOVE) ×2 IMPLANT
GOWN STRL REUS W/TWL LRG LVL3 (GOWN DISPOSABLE) ×2 IMPLANT
KIT TURNOVER KIT A (KITS) ×1 IMPLANT
MANIFOLD NEPTUNE II (INSTRUMENTS) ×1 IMPLANT
NDL HYPO 25X1 1.5 SAFETY (NEEDLE) ×1 IMPLANT
NEEDLE HYPO 25X1 1.5 SAFETY (NEEDLE) ×1
NS IRRIG 1000ML POUR BTL (IV SOLUTION) ×1 IMPLANT
PACK MINOR (CUSTOM PROCEDURE TRAY) IMPLANT
PAD ARMBOARD 7.5X6 YLW CONV (MISCELLANEOUS) ×1 IMPLANT
POSITIONER HEAD 8X9X4 ADT (SOFTGOODS) ×1 IMPLANT
SCRUB TECHNI CARE 4 OZ NO DYE (MISCELLANEOUS) IMPLANT
SET BASIN LINEN APH (SET/KITS/TRAYS/PACK) ×1 IMPLANT
SOL SCRUB PVP POV-IOD 4OZ 7.5% (MISCELLANEOUS) ×1
SOLUTION SCRB POV-IOD 4OZ 7.5% (MISCELLANEOUS) IMPLANT
SUT 3-0 BLK 1X30 PSL (SUTURE) IMPLANT
SUT SILK 2 0 SH (SUTURE) IMPLANT
SUT VIC AB 3-0 SH 27X BRD (SUTURE) ×1 IMPLANT
SYR CONTROL 10ML LL (SYRINGE) ×1 IMPLANT
TAPE CLOTH SURG 4X10 WHT LF (GAUZE/BANDAGES/DRESSINGS) IMPLANT

## 2023-08-08 NOTE — Progress Notes (Signed)
 [facility]  Spoke with the patient's husband in the consultation room.  I explained that she tolerated the procedure without difficulty.  She has external stitches in place that will be removed at her follow up visit.  I will call them with the pathology results once I have them.  I discharged her home with a prescription for narcotic pain medication that they should take as needed for pain.  I also want her taking scheduled Tylenol .  If they take the narcotic pain medication, they should take a stool softener as well.  I have also given a prescription for an antiemetic that she can take as needed with the narcotic for nausea. The patient will follow-up with me in 2 weeks.  All questions were answered to his expressed satisfaction.  Dorothyann Brittle, DO [facility] [address] [place], [place] [address] [phone] (office)

## 2023-08-08 NOTE — Anesthesia Preprocedure Evaluation (Signed)
 Anesthesia Evaluation  Patient identified by MRN, date of birth, ID band Patient awake    Reviewed: Allergy & Precautions, H&P , NPO status , Patient's Chart, lab work & pertinent test results, reviewed documented beta blocker date and time   History of Anesthesia Complications (+) PONV and history of anesthetic complications  Airway Mallampati: II  TM Distance: >3 FB Neck ROM: full    Dental no notable dental hx.    Pulmonary neg pulmonary ROS, pneumonia   Pulmonary exam normal breath sounds clear to auscultation       Cardiovascular Exercise Tolerance: Good hypertension, +CHF  negative cardio ROS + dysrhythmias  Rhythm:regular Rate:Normal     Neuro/Psych   Anxiety      Neuromuscular disease negative neurological ROS  negative psych ROS   GI/Hepatic negative GI ROS, Neg liver ROS,GERD  ,,  Endo/Other  negative endocrine ROSdiabetes    Renal/GU Renal diseasenegative Renal ROS  negative genitourinary   Musculoskeletal   Abdominal   Peds  Hematology negative hematology ROS (+)   Anesthesia Other Findings   Reproductive/Obstetrics negative OB ROS                             Anesthesia Physical Anesthesia Plan  ASA: 2  Anesthesia Plan: General and General LMA   Post-op Pain Management:    Induction:   PONV Risk Score and Plan: Ondansetron   Airway Management Planned:   Additional Equipment:   Intra-op Plan:   Post-operative Plan:   Informed Consent: I have reviewed the patients History and Physical, chart, labs and discussed the procedure including the risks, benefits and alternatives for the proposed anesthesia with the patient or authorized representative who has indicated his/her understanding and acceptance.     Dental Advisory Given  Plan Discussed with: CRNA  Anesthesia Plan Comments:        Anesthesia Quick Evaluation

## 2023-08-08 NOTE — Op Note (Signed)
 [facility] Operative Note  08/08/23  Preoperative Diagnosis: Right buttock mass   Postoperative Diagnosis: Same   Procedure(s) Performed: Excision of right buttock mass   Surgeon: Dorothyann Brittle, DO    Assistants: Donnice Mutter, MS3    Anesthesia: General endotracheal   Anesthesiologist: Kendell Yvonna PARAS, MD    Specimens: Right buttock mass, 8 x 4 x 5 cm, short stitch marks superior, long stitch marks lateral   Estimated Blood Loss: Minimal   Blood Replacement: None    Complications: None   Wound Class: Contaminated   Operative Indications: Patient is a 78 year old female who presents for excision of a right buttock mass.  The area has been present for about a month and a half, it was causing her significant pain.  She presented to the office, at which time I performed an incision and drainage of the area.  At her follow-up a week later, the area had failed to significantly improve, and pathology demonstrated some cells with atypia.  For this reason, decision was made to take the patient to the operating room for complete excision of the area.  She is agreeable to surgery at this time.  All risks and benefits of performing this procedure were discussed with the patient including pain, infection, bleeding, damage to the surrounding structures, and need for more procedures or surgery. The patient voiced understanding of the procedure, all questions were sought and answered, and consent was obtained.  Findings: Right buttock mass measuring 8 x 4 x 5 cm, hard/indurated and invading down to the gluteus muscle.  Short stitch marks superior, long stitch marks lateral   Procedure: The patient was taken to the operating room and placed supine. General endotracheal anesthesia was induced. Intravenous antibiotics were administered per protocol.  Patient was then placed in the prone position.  The buttock was prepared and draped in the usual sterile fashion.  A time-out  was completed verifying correct patient, procedure, site, positioning, and implant(s) and/or special equipment prior to beginning this procedure.  A elliptical incision was made overlying the mass and around the open nonhealing wound. Using electrocautery, the dermis and subcutaneous tissues dissected around the mass.  The mass was noted to be extending all the way down to the gluteus muscle.  The mass was also noted to be hard and indurated, but there is no evidence of any purulence present.  The mass was dissected off of the gluteal muscle and removed.  It was sent to pathology for evaluation with a short stitch marking superior and a long stitch marking lateral. Hemostasis was achieved using electrocautery.  The incision site was then localized with Marcaine .  3-0 Vicryl was used to close down the potential space of the wound.  The dermis was approximated using 3-0 Vicryl sutures. The skin was closed using 3-0 nylon in a vertical mattress fashion.  The incision was dressed with Xeroform gauze, 4 x 4's, and Medipore tape.   Final inspection revealed acceptable hemostasis. All counts were correct at the end of the case. The patient was awakened from anesthesia and extubated without complication.  The patient went to the PACU in stable condition.   Dorothyann Brittle, DO  [facility] [address] [place], [place] [address] [phone] (office)

## 2023-08-08 NOTE — Anesthesia Procedure Notes (Signed)
 Procedure Name: Intubation Date/Time: 08/08/2023 7:51 AM  Performed by: Elaine Delon CROME, CRNAPre-anesthesia Checklist: Patient identified, Emergency Drugs available, Suction available and Patient being monitored Patient Re-evaluated:Patient Re-evaluated prior to induction Oxygen Delivery Method: Circle system utilized Preoxygenation: Pre-oxygenation with 100% oxygen Induction Type: IV induction Ventilation: Mask ventilation without difficulty Laryngoscope Size: Mac and 3 Grade View: Grade II Tube type: Oral Tube size: 7.0 mm Number of attempts: 1 Airway Equipment and Method: Stylet Placement Confirmation: ETT inserted through vocal cords under direct vision, positive ETCO2 and breath sounds checked- equal and bilateral Secured at: 21 cm Tube secured with: Tape Dental Injury: Teeth and Oropharynx as per pre-operative assessment

## 2023-08-08 NOTE — Discharge Instructions (Signed)
 Ambulatory Surgery Discharge Instructions  General Anesthesia or Sedation Do not drive or operate heavy machinery for 24 hours.  Do not consume alcohol, tranquilizers, sleeping medications, or any non-prescribed medications for 24 hours. Do not make important decisions or sign any important papers in the next 24 hours. You should have someone with you tonight at home.  Activity  You are advised to go directly home from the hospital.  Restrict your activities and rest for a day.  Resume light activity tomorrow. No heavy lifting over 10 lbs or strenuous exercise.  Fluids and Diet Begin with clear liquids, bouillon, dry toast, soda crackers.  If not nauseated, you may go to a regular diet when you desire.  Greasy and spicy foods are not advised.  Medications  If you have not had a bowel movement in 24 hours, take 2 tablespoons over the counter Milk of mag.             You May resume your blood thinners tomorrow (Aspirin, coumadin, or other).  You are being discharged with prescriptions for Opioid/Narcotic Medications: There are some specific considerations for these medications that you should know. Opioid Meds have risks & benefits. Addiction to these meds is always a concern with prolonged use Take medication only as directed Do not drive while taking narcotic pain medication Do not crush tablets or capsules Do not use a different container than medication was dispensed in Lock the container of medication in a cool, dry place out of reach of children and pets. Opioid medication can cause addiction Do not share with anyone else (this is a felony) Do not store medications for future use. Dispose of them properly.     Disposal:  Find a [place]  household drug take back site near you.  If you can't get to a drug take back site, use the recipe below as a last resort to dispose of expired, unused or unwanted drugs. Disposal  (Do not dispose chemotherapy drugs this way, talk to your  prescribing doctor instead.) Step 1: Mix drugs (do not crush) with dirt, kitty litter, or used coffee grounds and add a small amount of water to dissolve any solid medications. Step 2: Seal drugs in plastic bag. Step 3: Place plastic bag in trash. Step 4: Take prescription container and scratch out personal information, then recycle or throw away.  Operative Site  You have external stitches in place.  These will be removed at your follow up visit.  Ok to English as a second language teacher. Keep wound clean and dry. No baths or swimming. No lifting more than 10 pounds.  Contact Information: If you have questions or concerns, please call our office, [phone], Monday- Thursday 8AM-5PM and Friday 8AM-12Noon.  If it is after hours or on the weekend, please call [facility] Main [facility], [phone], and ask to speak to the surgeon on call for Dr. Evonnie at [facility].   SPECIFIC COMPLICATIONS TO WATCH FOR: Inability to urinate Fever over 101? F by mouth Nausea and vomiting lasting longer than 24 hours. Pain not relieved by medication ordered Swelling around the operative site Increased redness, warmth, hardness, around operative area Numbness, tingling, or cold fingers or toes Blood -soaked dressing, (small amounts of oozing may be normal) Increasing and progressive drainage from surgical area or exam site

## 2023-08-08 NOTE — Transfer of Care (Signed)
 Immediate Anesthesia Transfer of Care Note  Patient: [name]  Procedure(s) Performed: EXCISION MASS RIGHT BUTTOCKS (Right: Buttocks)  Patient Location: PACU  Anesthesia Type:General  Level of Consciousness: drowsy  Airway & Oxygen Therapy: Patient Spontanous Breathing and Patient connected to nasal cannula oxygen  Post-op Assessment: Report given to RN and Post -op Vital signs reviewed and stable  Post vital signs: Reviewed and stable  Last Vitals:  Vitals Value Taken Time  BP 153/65   Temp 97.5   Pulse 76 08/08/23 0911  Resp 16 08/08/23 0911  SpO2 98 % 08/08/23 0911  Vitals shown include unfiled device data.  Last Pain:  Vitals:   08/08/23 0644  TempSrc: Oral  PainSc: 3       Patients Stated Pain Goal: 7 (08/08/23 9355)  Complications: No notable events documented.

## 2023-08-08 NOTE — Progress Notes (Signed)
 Arrived at 0910 To pacu

## 2023-08-08 NOTE — H&P (Signed)
 [facility] History and Physical  Reason for Referral: Buttock abscess Referring Physician: Dr. Jodie Aldona [name] is a 78 y.o. female.  HPI: Patient presents for evaluation of a buttock abscess.  Starting about 1 month ago she noted an increased swelling area on her buttock.  Since that time, she has received 2 separate courses of antibiotics, the first of which was for the abscess and the second of which was for a sinus infection.  About a week after the abscess came up, she noted some drainage from the area, which she describes as a bloody brown drainage.  She denies any other abscesses anywhere else.  She denies fevers and chills.  She will intermittently have pain when sitting on this abscess.  Her past medical history is significant for diabetes, hypertension, chronic kidney disease, and CHF.  She denies use of blood thinning medications.  She has a history of a tubal ligation and orthopedic surgeries.  She denies use of tobacco products, alcohol, and illicit drugs.   She then presents to clinic for follow up status post incision and drainage of right buttock mass on 12/12.  Patient states that she has been doing well since this visit.  She feels that the area has slightly improved.  She denies significant drainage, and her daughter has been doing dressing changes.  She has 1 more day left of her antibiotics.  She denies fevers and chills.   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) 08/24/2020   Diabetes mellitus without complication (HCC)    takes Trulicity ,Jardiance,and Metformin  daily.Average fasting blood sugar runs around130   Dysrhythmia    PVCs, has MVP   Fibromyalgia    GERD (gastroesophageal reflux disease)    takes Omeprazole  daily   Gout  07/19/2021   On allopurinol    History of bronchitis > 8 yrs ago   History of shingles    HTN (hypertension)    takes Amlodipine  and Micardis daily   Hx of colonic polyps    benign   Internal and external hemorrhoids without complication    Joint pain    Mitral valve prolapse    Neuromuscular disorder (HCC)    restless leg syndrome   Nocturia    Pneumonia    PONV (postoperative nausea and vomiting)    when gets injections in joints gets hives.Betadine rash   Restless leg syndrome    takes Requip  at bedtime   Seasonal allergies    takes Claritin daily as needed   Urinary frequency    Uterine fibroid     Past Surgical History:  Procedure Laterality Date   BUNIONECTOMY Bilateral    COLONOSCOPY  07/12/2004   diverticulosis, internal and external hemorrhoids   ETHMOIDECTOMY Bilateral 08/15/2022   Procedure: ETHMOIDECTOMY WITH TISSUE REMOVAL;  Surgeon: Karis Clunes, MD;  Location: [facility];  Service: ENT;  Laterality: Bilateral;   FINGER ARTHROSCOPY WITH CARPOMETACARPEL (CMC) ARTHROPLASTY Right 09/28/2015   Procedure: RIGHT THUMB TRAPEZIUM EXCISION WITH CARPOMETACARPEL (CMC) ARTHROPLASTY AND TENDON TRANSFER;  Surgeon: Prentice Pagan, MD;  Location: [facility];  Service: Orthopedics;  Laterality: Right;   FRONTAL SINUS EXPLORATION Bilateral 08/15/2022   Procedure: FRONTAL RECESS SINUS EXPLORATION WITH NAVIGATION;  Surgeon: Karis Clunes, MD;  Location: [facility];  Service: ENT;  Laterality: Bilateral;   HEMORRHOID SURGERY     almost 40 yrs ago   HYSTEROSCOPY WITH D & C  08/23/2000   and resectoscopic myomectomy   KNEE CLOSED REDUCTION Left 06/05/2023   Procedure: CLOSED MANIPULATION KNEE;  Surgeon: Melodi Lerner, MD;  Location: [facility];  Service: Orthopedics;  Laterality: Left;   LUMBAR DISC SURGERY  03/16/2005   LUMBAR EPIDURAL INJECTION     MAXILLARY ANTROSTOMY Bilateral 08/15/2022   Procedure: ENDOSCOPIC MAXILLARY ANTROSTOMY WITH TISSUE REMOVAL;  Surgeon: Karis Clunes, MD;  Location: [facility];  Service: ENT;  Laterality: Bilateral;   PLANTAR FASCIA RELEASE Left 02/03/2010   and torn tendon   REVERSE SHOULDER ARTHROPLASTY Right 12/14/2020   Procedure: REVERSE SHOULDER ARTHROPLASTY;  Surgeon: Kay Kemps, MD;  Location: [facility];  Service: Orthopedics;  Laterality: Right;   RIGHT/LEFT HEART CATH AND CORONARY ANGIOGRAPHY N/A 09/07/2020   Procedure: RIGHT/LEFT HEART CATH AND CORONARY ANGIOGRAPHY;  Surgeon: Mady Bruckner, MD;  Location: [facility];  Service: Cardiovascular;  Laterality: N/A;   SINUS ENDO W/FUSION Bilateral 08/15/2022   Procedure: SPHENOIDECTOMY WITH TISSUE REMOVAL WITH NAVIGATION;  Surgeon: Karis Clunes, MD;  Location: [facility];  Service: ENT;  Laterality: Bilateral;   TENDON TRANSFER Right 09/28/2015   Procedure: TENDON TRANSFER;  Surgeon: Prentice Pagan, MD;  Location: [facility];  Service: Orthopedics;  Laterality: Right;   TONSILLECTOMY     TOTAL KNEE ARTHROPLASTY Left 04/24/2023   Procedure: TOTAL KNEE ARTHROPLASTY;  Surgeon: Melodi Lerner, MD;  Location: [facility];  Service: Orthopedics;  Laterality: Left;   TUBAL LIGATION     TURBINATE REDUCTION Bilateral 08/15/2022   Procedure: TURBINATE REDUCTION;  Surgeon: Karis Clunes, MD;  Location: [facility];  Service: ENT;  Laterality: Bilateral;   WRIST SURGERY     left, removal of cyst    Family History  Problem Relation Age of Onset   Pancreatic cancer Father    Colon cancer Neg Hx    Breast cancer Neg Hx     Social History   Tobacco Use   Smoking status: Never   Smokeless tobacco: Never  Vaping Use   Vaping status: Never Used  Substance Use Topics   Alcohol use: No    Alcohol/week: 0.0 standard drinks of alcohol   Drug use: No    Medications: I have reviewed the patient's current medications. Allergies as of 08/08/2023       Reactions   Amlodipine  Swelling   Amoxicillin -pot Clavulanate Hives   Depo-medrol  [methylprednisolone ]  Hives   Gabapentin Rash   hives   Levothyroxine Other (See Comments)   severe muscle pain in neck   Sitagliptin Other (See Comments)   Urinary hesitancy Januvia   Amoxicillin  Hives   Codeine Nausea And Vomiting   Corticosteroids Hives   Steroid injections   Tetracyclines & Related Nausea And Vomiting   Ace Inhibitors    cough   Dexamethasone  Rash       ROS:  Constitutional: negative for chills, fatigue, and fevers Eyes: negative for visual disturbance and pain Ears, nose, mouth, throat, and face: positive for sinus problems, negative for ear drainage and sore throat Respiratory: negative for cough, wheezing, and shortness of breath Cardiovascular: negative for chest pain and palpitations Gastrointestinal: negative for abdominal pain, nausea, reflux symptoms, and vomiting Genitourinary:negative for dysuria and frequency Integument/breast: negative for dryness and rash Hematologic/lymphatic: negative for bleeding and lymphadenopathy Musculoskeletal:positive for back pain and neck pain  Neurological: negative for dizziness and tremors Endocrine: negative for temperature intolerance  Blood pressure (!) 154/56, pulse 62, temperature 97.6 F (36.4 C), temperature source Oral, resp. rate 17, height 5' 4 (1.626 m), weight 87.1 kg, SpO2 100%. Physical Exam Vitals reviewed.  Constitutional:      Appearance: Normal appearance.  HENT:     Head: Normocephalic and atraumatic.  Eyes:     Extraocular Movements: Extraocular movements intact.     Pupils: Pupils are equal, round, and reactive to light.  Cardiovascular:     Rate and Rhythm: Normal rate.  Pulmonary:     Effort: Pulmonary effort is normal.  Abdominal:     General: There is no distension.     Palpations: Abdomen is soft.     Tenderness: There is no abdominal tenderness.  Musculoskeletal:        General: Normal range of motion.     Cervical back: Normal range of motion.  Skin:    General: Skin is warm and dry.      Comments: Right buttock wound with surrounding erythema and induration, minimally tender to palpation, and granulation tissue at base of wound  Neurological:     General: No focal deficit present.     Mental Status: She is alert and oriented to person, place, and time.  Psychiatric:        Mood and Affect: Mood normal.        Behavior: Behavior normal.     Results: Results for orders placed or performed during the hospital encounter of 08/08/23 (from the past 48 hours)  Glucose, capillary     Status: Abnormal   Collection Time: 08/08/23  6:49 AM  Result Value Ref Range   Glucose-Capillary 105 (H) 70 - 99 mg/dL    Comment: Glucose reference range applies only to samples taken after fasting for at least 8 hours.    No results found.  Assessment:  78 y.o. yo Female who presents for follow-up status post incision and drainage of right buttock mass on 12/12   Plan:  -Patient feels that she is improving, though her exam has not changed significantly since last week -Given the area is still indurated and erythematous with the pathology demonstrating mild atypia, I feel that this area will need to be fully excised to rule out any possible malignancy -The risk and benefits of right gluteal mass excision were discussed with the patient, including but not limited to bleeding, infection, injury to surrounding structures, need for additional procedures, and poor wound healing.  After careful consideration, [name] has decided to proceed with surgery. -Patient tentatively scheduled for surgery on 08/08/2023 -Prescription for Bactrim  was extended out for an additional 2 weeks to decrease the risk of this area getting further inflamed between now and the excision -Advised that her and her daughter should continue with daily dressing changes until surgery   All of the above recommendations were discussed with the patient, and all of patient's questions were answered to her expressed  satisfaction.  Dorothyann Brittle, DO [facility] [address] [place], [place] [address] [phone] (office)

## 2023-08-09 ENCOUNTER — Encounter (HOSPITAL_COMMUNITY): Payer: Self-pay | Admitting: Surgery

## 2023-08-09 ENCOUNTER — Telehealth (INDEPENDENT_AMBULATORY_CARE_PROVIDER_SITE_OTHER): Payer: Medicare Other | Admitting: Surgery

## 2023-08-09 DIAGNOSIS — Z09 Encounter for follow-up examination after completed treatment for conditions other than malignant neoplasm: Secondary | ICD-10-CM

## 2023-08-09 LAB — SURGICAL PATHOLOGY

## 2023-08-09 NOTE — Telephone Encounter (Signed)
 [facility]  Called to update the patient regarding her pathology.  I explained that the pathology is demonstrating fat necrosis and reactive fibrosis.  She likely sustained some type of trauma to the area and this is a reaction to the trauma.  There was no evidence of malignancy at the area.  She has been doing ok since the surgery yesterday, and her pain is manageable.  She will follow up with me on 1/21 for evaluation of the incision site and likely removal of the stitches.  Pathology: A. BUTTOCKS MASS, RIGHT, EXCISION:  - Mature adipose tissue with fat necrosis and associated reactive  fibrosis  - No evidence of malignancy   Dorothyann Brittle, DO [facility] [address] [place], [place] [address] [phone] (office)

## 2023-08-10 NOTE — Anesthesia Postprocedure Evaluation (Signed)
 Anesthesia Post Note  Patient: [name]  Procedure(s) Performed: EXCISION MASS RIGHT BUTTOCKS (Right: Buttocks)  Patient location during evaluation: Phase II Anesthesia Type: General Level of consciousness: awake Pain management: pain level controlled Vital Signs Assessment: post-procedure vital signs reviewed and stable Respiratory status: spontaneous breathing and respiratory function stable Cardiovascular status: blood pressure returned to baseline and stable Postop Assessment: no headache and no apparent nausea or vomiting Anesthetic complications: no Comments: Late entry   No notable events documented.   Last Vitals:  Vitals:   08/08/23 0945 08/08/23 0955  BP: (!) 143/60 (!) 151/54  Pulse: 63 62  Resp: 10 14  Temp: 36.4 C 36.6 C  SpO2: 100% 98%    Last Pain:  Vitals:   08/09/23 1333  TempSrc:   PainSc: 4                  Yvonna JINNY Bosworth

## 2023-08-14 ENCOUNTER — Telehealth: Payer: Self-pay | Admitting: *Deleted

## 2023-08-14 NOTE — Telephone Encounter (Signed)
 Surgical Date: 08/08/2023 Procedure: Excision Mass, Buttocks  Received call from patient [phone]~ telephone.   Patient reports that she has large squishy knot above incision on buttocks. Reports that she also has large amounts of bloody drainage from the area.   Appointment scheduled to evaluate for possible hematoma.

## 2023-08-15 ENCOUNTER — Ambulatory Visit (INDEPENDENT_AMBULATORY_CARE_PROVIDER_SITE_OTHER): Payer: Medicare Other | Admitting: Surgery

## 2023-08-15 VITALS — BP 135/75 | HR 62 | Temp 97.7°F | Resp 14 | Ht 64.0 in | Wt 196.0 lb

## 2023-08-15 DIAGNOSIS — L7634 Postprocedural seroma of skin and subcutaneous tissue following other procedure: Secondary | ICD-10-CM

## 2023-08-15 MED ORDER — SULFAMETHOXAZOLE-TRIMETHOPRIM 800-160 MG PO TABS: 1.0000 | ORAL_TABLET | Freq: Two times a day (BID) | ORAL | 0 refills | Status: DC

## 2023-08-15 NOTE — Patient Instructions (Signed)
-  Remove packing tomorrow -Keep area covered -You will continue to have clear pink/reddish draingae -I will see you next week.  Call the office if the drainage looks like pus, redness around the area worsens, or if you begin to have fever/chills

## 2023-08-17 ENCOUNTER — Other Ambulatory Visit: Payer: Self-pay | Admitting: Family Medicine

## 2023-08-18 NOTE — Telephone Encounter (Signed)
Copied from CRM [id number]. Topic: Clinical - Medication Refill >> Aug 18, 2023  9:29 AM Truddie Crumble wrote: Most Recent Primary Care Visit:  Provider: Willow Ora  Department: [facility]  Visit Type: OFFICE VISIT  Date: 07/07/2023  Medication: BD nano pen needles  Has the patient contacted their pharmacy? Yes (Agent: If no, request that the patient contact the pharmacy for the refill. If patient does not wish to contact the pharmacy document the reason why and proceed with request.) (Agent: If yes, when and what did the pharmacy advise?)  Is this the correct pharmacy for this prescription? Yes If no, delete pharmacy and type the correct one.  This is the patient's preferred pharmacy:  [id number] - [place], [place] - [address] [place] [address] Phone: [phone] Fax: [phone]   Has the prescription been filled recently? No  Is the patient out of the medication? Yes  Has the patient been seen for an appointment in the last year OR does the patient have an upcoming appointment? Yes  Can we respond through MyChart? Yes  Agent: Please be advised that Rx refills may take up to 3 business days. We ask that you follow-up with your pharmacy.

## 2023-08-18 NOTE — Progress Notes (Addendum)
 [facility] Note   HPI:  78 y.o. Female presents to clinic for post-op follow-up status post excision of right buttock mass on 1/7.  Patient was doing well, but starting a couple of days ago, she noted that she started having bloody drainage from her incision site.  She describes the volume as a large amount of drainage.  She denies any fevers or chills.  She only has pain at the incision site when she is sitting on it.  She denies fevers and chills.  She denies any purulent drainage.  Review of Systems:  All other review of systems: otherwise negative   Vital Signs:  BP 135/75   Pulse 62   Temp 97.7 F (36.5 C) (Oral)   Resp 14   Ht 5' 4 (1.626 m)   Wt 196 lb (88.9 kg)   SpO2 97%   BMI 33.64 kg/m    Physical Exam:  Physical Exam Vitals reviewed.  Constitutional:      Appearance: Normal appearance.  Skin:    General: Skin is warm and dry.     Comments: Right buttock incision site with sutures in place, mild erythema from suture irritation, no significant tenderness to palpation, mild fullness at the superior aspect with serosanguineous drainage coming from the middle aspect of the incision  Neurological:     Mental Status: She is alert.     Laboratory studies: None  Imaging:  None  Assessment:  78 y.o. yo Female who presents for follow-up status post excision of right buttock mass on 1/7 with subsequent development of a seroma  Plan:  -I attempted aspiration of the seroma at bedside without significant success.  I was able to probe the middle of the incision where serosanguineous drainage was noted.  Upon opening the incision site at this area, there was a large amount of serosanguineous drainage evacuated from the surgical site.  Area was then subsequently irrigated with saline.  A iodoform wick was then placed through this opening to allow the area to continue to drain -Explained to the patient that she developed a seroma which was draining.  I am not  concerned for active infection or bleeding -She may remove the packing tomorrow, and she will need to keep the area covered, as she may continue to have drainage from the incision site -Prescription for 1 week of Bactrim  provided to the patient -She will follow-up with me next week to evaluate for suture removal  All of the above recommendations were discussed with the patient, and all of patient's questions were answered to her expressed satisfaction.  Dorothyann Brittle, DO [facility] [address] [place], [place] [address] [phone] (office)

## 2023-08-22 ENCOUNTER — Ambulatory Visit (INDEPENDENT_AMBULATORY_CARE_PROVIDER_SITE_OTHER): Payer: Medicare Other | Admitting: Surgery

## 2023-08-22 ENCOUNTER — Encounter: Payer: Self-pay | Admitting: Surgery

## 2023-08-22 VITALS — BP 144/78 | HR 69 | Temp 97.6°F | Resp 16 | Ht 64.0 in | Wt 195.0 lb

## 2023-08-22 DIAGNOSIS — L7634 Postprocedural seroma of skin and subcutaneous tissue following other procedure: Secondary | ICD-10-CM

## 2023-08-22 DIAGNOSIS — Z09 Encounter for follow-up examination after completed treatment for conditions other than malignant neoplasm: Secondary | ICD-10-CM

## 2023-08-22 NOTE — Patient Instructions (Addendum)
Wound Care Instructions: -Change pacing daily.  Recommend changing it just after showering.  Remove packing either before shower or during shower.  May let soapy water run over top of the incision site.  Pat dry after showering and repack incision -Pack incision with 12 inches of iodoform packing.  May use a Q-tip to pack incision site -May change outer gauze as needed for saturation.  The area will continue to drain serous fluid

## 2023-08-22 NOTE — Progress Notes (Unsigned)
[facility] Note   HPI:  78 y.o. Female presents to clinic for post-op follow-up status post excision of right buttock mass on 1/7.  Since her follow-up last week, the patient has been doing well.  She states that there is still a fair amount of drainage from her incision site that she describes as a pink type drainage.  She is sitting with out significant issue and her pain is improved.  She denies fevers and chills.  She took her Bactrim for only 2 days and then stopped it as she was getting an itchy rash on her back.  Review of Systems:  All other review of systems: otherwise negative   Vital Signs:  BP (!) 144/78   Pulse 69   Temp 97.6 F (36.4 C) (Oral)   Resp 16   Ht 5\' 4"  (1.626 m)   Wt 195 lb (88.5 kg)   SpO2 95%   BMI 33.47 kg/m    Physical Exam:  Physical Exam Vitals reviewed.  Constitutional:      Appearance: Normal appearance.  Skin:    General: Skin is warm and dry.     Comments: Right buttock incision site healing well with sutures in place, opening in the mid aspect of incision, no significant tenderness to palpation and no surrounding erythema, serosanguineous drainage noted from opening at middle aspect of incision  Neurological:     Mental Status: She is alert.     Laboratory studies: None  Imaging:  None  Assessment:  77 y.o. yo Female who presents for follow-up status post excision of right buttock mass on 1/7 with subsequent development of a postoperative seroma  Plan:  -Sutures were removed from the incision site, as it was well-healed everywhere except for the middle aspect of the incision with the opening.  This middle area was probed, and a fair amount of serosanguineous drainage was evacuated. -To allow this area to completely drain and heal up, I recommend that we packed the incision site through this opening to prevent any wound breakdown at any other level patient.  Advised that she should pack the wound with iodoform packing.   Will remove packing either just before showering or while in the shower, and then allow soapy water to run over top of the opening.  Pat the area dry and repack with iodoform gauze.  Patient's daughter is able to assist her with dressing changes -Follow-up with me next week for wound recheck  All of the above recommendations were discussed with the patient, and all of patient's questions were answered to her expressed satisfaction.  Theophilus Kinds, DO [facility] [address] [place], [place] [address] [phone] (office)

## 2023-08-30 ENCOUNTER — Ambulatory Visit: Payer: Medicare Other | Admitting: Surgery

## 2023-08-30 VITALS — BP 146/74 | HR 68 | Temp 98.0°F | Resp 14 | Ht 64.0 in | Wt 197.0 lb

## 2023-08-30 DIAGNOSIS — L7634 Postprocedural seroma of skin and subcutaneous tissue following other procedure: Secondary | ICD-10-CM

## 2023-08-30 NOTE — Progress Notes (Unsigned)
[facility] Note   HPI:  78 y.o. Female presents to clinic for post-op follow-up status post excision of right buttock mass on 1/7.  She has an open area on her incision site that she has been packing with iodoform gauze.  The area continues to drain serous fluid.  She only has pain to the area when she is sitting with the packing in place.  She denies fever and chills.  Review of Systems:  All other review of systems: otherwise negative   Vital Signs:  BP (!) 146/74   Pulse 68   Temp 98 F (36.7 C) (Oral)   Resp 14   Ht 5\' 4"  (1.626 m)   Wt 197 lb (89.4 kg)   SpO2 96%   BMI 33.81 kg/m    Physical Exam:  Physical Exam Vitals reviewed.  Constitutional:      Appearance: Normal appearance.  Skin:    General: Skin is warm and dry.     Comments: Right buttock incision site healed will with opening in the center, with improving induration, serous drainage noted, pink granulation tissue at the base, nontender to palpation, no erythema  Neurological:     Mental Status: She is alert.     Laboratory studies: None   Imaging:  None   Assessment:  78 y.o. yo Female who presents for follow up status post excision of right buttock mass on 1/7 with subsequent development of a postoperative seroma.  Plan:  -Packing removed and replaced.  Advised that the cavity is smaller, and it will continue to close with daily packing changes.  Recommend changing packing once daily unless packing is falling out.  May replace outer gauze as needed for saturation  -Continue pack the wound with iodoform packing. Will remove packing either just before showering or while in the shower, and then allow soapy water to run over top of the opening. Pat the area dry and repack with iodoform gauze. Patient's daughter is able to assist her with dressing changes  -Call the office if she begins to have fever, chills, worsening pain or redness at the incision site or purulent drainage -Follow up  with me in 2 weeks  All of the above recommendations were discussed with the patient, and all of patient's questions were answered to her expressed satisfaction.  Theophilus Kinds, DO [facility] [address] [place], [place] [address] [phone] (office)

## 2023-08-31 LAB — HM DIABETES EYE EXAM

## 2023-09-14 ENCOUNTER — Encounter: Payer: Self-pay | Admitting: Surgery

## 2023-09-14 ENCOUNTER — Ambulatory Visit (INDEPENDENT_AMBULATORY_CARE_PROVIDER_SITE_OTHER): Payer: Medicare Other | Admitting: Surgery

## 2023-09-14 VITALS — BP 146/74 | HR 65 | Temp 97.5°F | Resp 12 | Ht 64.0 in | Wt 196.0 lb

## 2023-09-14 DIAGNOSIS — L7634 Postprocedural seroma of skin and subcutaneous tissue following other procedure: Secondary | ICD-10-CM

## 2023-09-18 NOTE — Progress Notes (Signed)
[facility] Note   HPI:  78 y.o. Female presents to clinic for post-op follow-up status post excision of right buttock mass on 1/7.  The open area at her incision site is still being packed, there is a 1 inch iodoform is not fitting as easily.  She still has drainage, though states it is less than her last visit.  She is currently only changing her pads twice a day denies fevers and chills.  Denies purulent drainage.  Review of Systems:  All other review of systems: otherwise negative   Vital Signs:  BP (!) 146/74   Pulse 65   Temp (!) 97.5 F (36.4 C) (Oral)   Resp 12   Ht 5\' 4"  (1.626 m)   Wt 196 lb (88.9 kg)   SpO2 97%   BMI 33.64 kg/m    Physical Exam:  Physical Exam Vitals reviewed.  Constitutional:      Appearance: Normal appearance.  Skin:    Comments: Right buttock incision site healed will with opening in the center, with improving induration, serous drainage noted, pink granulation tissue at the base, nontender to palpation, no erythema   Neurological:     Mental Status: She is alert.     Laboratory studies: None   Imaging:  None   Assessment:  78 y.o. yo Female who presents for follow-up status post excision of right buttock mass on 1/7 with subsequent development of a postoperative seroma.  Plan:  -Cavity is continuing to decrease in size.  Packing was removed and replaced -Advised to continue with iodoform packing dressing changes daily and change outer pad as needed for saturation -Call the office if she begins to have fever, chills, worsening pain or redness at the incision site, or purulent drainage -Follow up with me in 2 weeks  All of the above recommendations were discussed with the patient, and all of patient's questions were answered to her expressed satisfaction.  Theophilus Kinds, DO [facility] [address] [place], [place] [address] [phone] (office)

## 2023-09-28 ENCOUNTER — Encounter: Payer: Self-pay | Admitting: Surgery

## 2023-09-28 ENCOUNTER — Ambulatory Visit (INDEPENDENT_AMBULATORY_CARE_PROVIDER_SITE_OTHER): Payer: Medicare Other | Admitting: Surgery

## 2023-09-28 VITALS — BP 136/74 | HR 65 | Temp 97.6°F | Resp 12 | Ht 64.0 in | Wt 197.0 lb

## 2023-09-28 DIAGNOSIS — L7634 Postprocedural seroma of skin and subcutaneous tissue following other procedure: Secondary | ICD-10-CM

## 2023-09-28 NOTE — Progress Notes (Signed)
 [facility]   HPI:  78 y.o. Female presents to clinic for post-op follow-up status post excision of right buttock mass on 1/7.  Patient states that she is no longer packing the wound on her right buttock, as the opening has almost completely closed.  She still has serous drainage coming from the area, and changes her pads twice a day.  She denies fevers and chills.  Denies any purulent drainage.  Review of Systems:  All other review of systems: otherwise negative   Vital Signs:  BP 136/74   Pulse 65   Temp 97.6 F (36.4 C) (Oral)   Resp 12   Ht 5\' 4"  (1.626 m)   Wt 197 lb (89.4 kg)   SpO2 95%   BMI 33.81 kg/m    Physical Exam:  Physical Exam Vitals reviewed.  Constitutional:      Appearance: Normal appearance.  Skin:    Comments: Right buttock incision site healed well with 0.5 cm opening in the center, with no induration, serous drainage noted, pink granulation tissue at the base, nontender to palpation, no erythema  Neurological:     Mental Status: She is alert.     Laboratory studies: None   Imaging:  None   Assessment:  78 y.o. yo Female who presents for follow-up status post excision of right buttock mass on 1/7 with subsequent development of a postoperative seroma.   Plan:  -Cavity is continuing to decrease in size.  I was still able to probe the cavity and pack quarter inch iodoform packing -Advised to continue with iodoform packing dressing changes daily if able.  We discussed that I had to use the wood and of the cotton swab to pack the wound.  If unable to pack wound, recommend just keeping the area covered with an outer pad -Call the office if she begins to have fever, chills, worsening pain or redness at the incision site, or purulent drainage -Follow up with me in 3 weeks.  Discussed if there is still an open area with drainage, may place a stitch to help fully close  All of the above recommendations were discussed with the patient,  and all of patient's questions were answered to her expressed satisfaction.  Note: Portions of this report may have been transcribed using voice recognition software. Every effort has been made to ensure accuracy; however, inadvertent computerized transcription errors may still be present.   Theophilus Kinds, DO [facility] [address] [place], [place] [address] [phone] (office)

## 2023-10-06 ENCOUNTER — Ambulatory Visit: Payer: Medicare Other | Admitting: Family Medicine

## 2023-10-06 ENCOUNTER — Encounter: Payer: Self-pay | Admitting: Family Medicine

## 2023-10-06 VITALS — BP 136/74 | HR 69 | Temp 97.7°F | Ht 64.0 in | Wt 196.4 lb

## 2023-10-06 DIAGNOSIS — I152 Hypertension secondary to endocrine disorders: Secondary | ICD-10-CM | POA: Diagnosis not present

## 2023-10-06 DIAGNOSIS — E1169 Type 2 diabetes mellitus with other specified complication: Secondary | ICD-10-CM | POA: Diagnosis not present

## 2023-10-06 DIAGNOSIS — E782 Mixed hyperlipidemia: Secondary | ICD-10-CM | POA: Diagnosis not present

## 2023-10-06 DIAGNOSIS — M48061 Spinal stenosis, lumbar region without neurogenic claudication: Secondary | ICD-10-CM

## 2023-10-06 DIAGNOSIS — I5020 Unspecified systolic (congestive) heart failure: Secondary | ICD-10-CM | POA: Diagnosis not present

## 2023-10-06 DIAGNOSIS — N1831 Chronic kidney disease, stage 3a: Secondary | ICD-10-CM

## 2023-10-06 DIAGNOSIS — M545 Low back pain, unspecified: Secondary | ICD-10-CM | POA: Diagnosis not present

## 2023-10-06 DIAGNOSIS — E1159 Type 2 diabetes mellitus with other circulatory complications: Secondary | ICD-10-CM | POA: Diagnosis not present

## 2023-10-06 DIAGNOSIS — G8929 Other chronic pain: Secondary | ICD-10-CM

## 2023-10-06 DIAGNOSIS — R42 Dizziness and giddiness: Secondary | ICD-10-CM

## 2023-10-06 DIAGNOSIS — E1142 Type 2 diabetes mellitus with diabetic polyneuropathy: Secondary | ICD-10-CM

## 2023-10-06 DIAGNOSIS — M47816 Spondylosis without myelopathy or radiculopathy, lumbar region: Secondary | ICD-10-CM

## 2023-10-06 DIAGNOSIS — M1A9XX0 Chronic gout, unspecified, without tophus (tophi): Secondary | ICD-10-CM

## 2023-10-06 LAB — POCT GLYCOSYLATED HEMOGLOBIN (HGB A1C): Hemoglobin A1C: 7.9 % — AB (ref 4.0–5.6)

## 2023-10-06 LAB — COMPREHENSIVE METABOLIC PANEL
ALT: 13 U/L (ref 0–35)
AST: 19 U/L (ref 0–37)
Albumin: 4 g/dL (ref 3.5–5.2)
Alkaline Phosphatase: 94 U/L (ref 39–117)
BUN: 26 mg/dL — ABNORMAL HIGH (ref 6–23)
CO2: 26 meq/L (ref 19–32)
Calcium: 9.8 mg/dL (ref 8.4–10.5)
Chloride: 102 meq/L (ref 96–112)
Creatinine, Ser: 1.26 mg/dL — ABNORMAL HIGH (ref 0.40–1.20)
GFR: 41.1 mL/min — ABNORMAL LOW (ref >60.00)
Glucose, Bld: 134 mg/dL — ABNORMAL HIGH (ref 70–99)
Potassium: 4.5 meq/L (ref 3.5–5.1)
Sodium: 140 meq/L (ref 135–145)
Total Bilirubin: 0.4 mg/dL (ref 0.2–1.2)
Total Protein: 7.3 g/dL (ref 6.0–8.3)

## 2023-10-06 LAB — CBC WITH DIFFERENTIAL/PLATELET
Basophils Absolute: 0.1 10*3/uL (ref 0.0–0.1)
Basophils Relative: 1.4 % (ref 0.0–3.0)
Eosinophils Absolute: 0.4 10*3/uL (ref 0.0–0.7)
Eosinophils Relative: 6.1 % — ABNORMAL HIGH (ref 0.0–5.0)
HCT: 36.4 % (ref 36.0–46.0)
Hemoglobin: 11.6 g/dL — ABNORMAL LOW (ref 12.0–15.0)
Lymphocytes Relative: 31.4 % (ref 12.0–46.0)
Lymphs Abs: 1.9 10*3/uL (ref 0.7–4.0)
MCHC: 31.9 g/dL (ref 30.0–36.0)
MCV: 84.5 fl (ref 78.0–100.0)
Monocytes Absolute: 0.5 10*3/uL (ref 0.1–1.0)
Monocytes Relative: 7.5 % (ref 3.0–12.0)
Neutro Abs: 3.2 10*3/uL (ref 1.4–7.7)
Neutrophils Relative %: 53.6 % (ref 43.0–77.0)
Platelets: 288 10*3/uL (ref 150.0–400.0)
RBC: 4.31 Mil/uL (ref 3.87–5.11)
RDW: 16.9 % — ABNORMAL HIGH (ref 11.5–15.5)
WBC: 6 10*3/uL (ref 4.0–10.5)

## 2023-10-06 LAB — MICROALBUMIN / CREATININE URINE RATIO
Creatinine,U: 13.2 mg/dL
Microalb Creat Ratio: 53.2 mg/g — ABNORMAL HIGH (ref 0.0–30.0)
Microalb, Ur: <0.7 mg/dL (ref 0.0–1.9)

## 2023-10-06 LAB — LIPID PANEL
Cholesterol: 119 mg/dL (ref 0–200)
HDL: 58.5 mg/dL (ref >39.00)
LDL Cholesterol: 38 mg/dL (ref 0–99)
NonHDL: 60.04
Total CHOL/HDL Ratio: 2
Triglycerides: 111 mg/dL (ref 0.0–149.0)
VLDL: 22.2 mg/dL (ref 0.0–40.0)

## 2023-10-06 LAB — HM DIABETES EYE EXAM

## 2023-10-06 LAB — URIC ACID: Uric Acid, Serum: 5.1 mg/dL (ref 2.4–7.0)

## 2023-10-06 LAB — TSH: TSH: 2.18 u[IU]/mL (ref 0.35–5.50)

## 2023-10-06 MED ORDER — ROSUVASTATIN CALCIUM 20 MG PO TABS: 20.0000 mg | ORAL_TABLET | Freq: Every day | ORAL | Status: DC

## 2023-10-06 NOTE — Progress Notes (Signed)
 Subjective  CC:  Chief Complaint  Patient presents with   Diabetes   Hypertension    HPI: [name] is a 78 y.o. female who presents to the office today for follow up of diabetes and problems listed above in the chief complaint.  Diabetes follow up: Her diabetic control is reported as Unchanged. We increased lantus slightly. Admits to rare low blood sugar - 70s.  She denies exertional CP or SOB. She denies foot sores or paresthesias. On lyrica. Tolerating met and farxiga. Has been dealing with chronic abscess that could be affecting her blood sugars.  Abscess buttocks s/p surgical excision: still trying to get healed. I reviewed notes HTN: home readings are fair although diastolics can be in low 80s and occ systolic in low 140s. Our initial readings here are atypical.  CHF: due for cards visit. No sob or cp Lasix controls edema Lyrica controls neuropathy OA: chronic shoulder pain and back pain. Would like PT for low back.  No recent gout flares on low dose allopurinol. CKD; monitoring. [name]  Wt Readings from Last 3 Encounters:  10/06/23 196 lb 6.4 oz (89.1 kg)  09/28/23 197 lb (89.4 kg)  09/14/23 196 lb (88.9 kg)    BP Readings from Last 3 Encounters:  10/06/23 136/74  09/28/23 136/74  09/14/23 (!) 146/74    Assessment  1. Type 2 diabetes mellitus with peripheral neuropathy (HCC)   2. Congestive heart failure with left ventricular systolic dysfunction (HCC)   3. Combined hyperlipidemia associated with type 2 diabetes mellitus (HCC)   4. Hypertension associated with diabetes (HCC)   5. Chronic low back pain, unspecified back pain laterality, unspecified whether sciatica present   6. Chronic gout without tophus, unspecified cause, unspecified site   7. Stage 3a chronic kidney disease (HCC)   8. Spinal stenosis of lumbar region without neurogenic claudication   9. Localized osteoarthritis of lumbar spine   10. Vertigo      Plan  Diabetes is currently  adequately controlled. Goal A1c is < 8 for this pt given comorbidities, age, h/o hypoglycemia. No med changes today. Recheck 3 months. Spring diet and activity levels are better as weather improves which should mover her to goal. Imms up to date. Eye exam current. Called for records HLD on statin for non fasting recheck today HTN: to see cards soon. May need mild med adjustment. At times, close to goal and has hypotension in past so will defer mgt to cards at this time.  Ckd monitor.  Refer for PT for back pain Check uric acid and kidney function.  Abscess s/p surgery: once completely cleared, sugars should improve as well.  Follow up: 3 mo for recheck Orders Placed This Encounter  Procedures   CBC with Differential/Platelet   Comprehensive metabolic panel   Lipid panel   TSH   Microalbumin / creatinine urine ratio   Uric acid   Ambulatory referral to Physical Therapy   POCT HgB A1C   Meds ordered this encounter  Medications   rosuvastatin (CRESTOR) 20 MG tablet    Sig: Take 1 tablet (20 mg total) by mouth at bedtime.      Immunization History  Administered Date(s) Administered   Fluad Quad(high Dose 65+) 04/19/2018, 06/16/2020, 07/01/2020, 04/16/2021, 05/10/2022   Fluad Trivalent(High Dose 65+) 05/24/2023   Influenza Split 06/01/2010   Influenza, High Dose Seasonal PF 05/13/2014, 04/24/2015, 04/12/2017, 04/19/2018, 04/24/2019, 06/16/2020   Influenza, Seasonal, Injecte, Preservative Fre 04/09/2013, 05/03/2016   Influenza,inj,Quad PF,6+ Mos  05/01/2017   Influenza-Unspecified 04/03/2012   Moderna Sars-Covid-2 Vaccination 01/15/2021, 02/12/2021   Pneumococcal Conjugate-13 05/13/2014   Pneumococcal Polysaccharide-23 02/15/2011   Td 08/01/1998   Td (Adult), 2 Lf Tetanus Toxid, Preservative Free 08/01/1998   Tdap 05/01/2004   Unspecified SARS-COV-2 Vaccination 05/11/2021   Zoster Recombinant(Shingrix) 04/24/2019, 08/13/2019   Zoster, Live 02/15/2011    Diabetes Related Lab  Review: Lab Results  Component Value Date   HGBA1C 7.9 (A) 10/06/2023   HGBA1C 7.7 (A) 07/07/2023   HGBA1C 7.7 (H) 04/13/2023    Lab Results  Component Value Date   MICROALBUR 1.8 10/04/2022   Lab Results  Component Value Date   CREATININE 1.30 (H) 08/08/2023   BUN 34 (H) 08/08/2023   NA 140 08/08/2023   K 4.3 08/08/2023   CL 106 08/08/2023   CO2 22 08/03/2023   Lab Results  Component Value Date   CHOL 109 10/04/2022   CHOL 111 09/28/2021   Lab Results  Component Value Date   HDL 57.90 10/04/2022   HDL 55.70 09/28/2021   Lab Results  Component Value Date   LDLCALC 31 10/04/2022   LDLCALC 37 09/28/2021   Lab Results  Component Value Date   TRIG 102.0 10/04/2022   TRIG 91.0 09/28/2021   TRIG 194 (H) 08/13/2020   Lab Results  Component Value Date   CHOLHDL 2 10/04/2022   CHOLHDL 2 09/28/2021   No results found for: "LDLDIRECT" The ASCVD Risk score (Arnett DK, et al., 2019) failed to calculate for the following reasons:   The valid total cholesterol range is 130 to 320 mg/dL I have reviewed the PMH, Fam and Soc history. 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 Farxiga, lantus, met 500 bid    Hypertension associated with diabetes (HCC) 11/08/2010     Priority: High   History of total knee arthroplasty 2024, Dr. 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     Priority: Medium    Gout 07/19/2021     Priority: Medium     On allopurinol    Rotator cuff tear arthropathy 10/22/2020     Priority: Medium    Primary osteoarthritis of left shoulder 07/29/2020     Priority: Medium    DDD (degenerative disc disease), cervical 04/30/2020     Priority: Medium    Osteoarthritis of left knee 11/05/2018     Priority: Medium    Chronic low back pain 09/05/2017     Priority: Medium    Degenerative lumbar disc 06/04/2015     Priority: Medium    S/P lumbar laminectomy 06/04/2015     Priority: Medium    Restless leg syndrome 05/13/2014  Priority: Medium    Spinal stenosis of lumbar region without neurogenic claudication 09/12/2012     Priority: Medium     Formatting of this note might be different from the original. Right L4/5    GERD (gastroesophageal reflux disease) 05/25/2011     Priority: Medium     Chronic recurrent heartburn symptoms for 3 or 4 years without weight loss, bleeding or dysphagia. Bonds well to daily PPI.Iva Boop, MD, [facility]     Mitral valve prolapse 10/10/2008     Priority: Medium     Formatting of this note might be different from the original. 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 Suszanne Conners, CT scan, sinus surgery 2023    Hemorrhoids, internal, with bleeding 05/25/2011     Priority: Low    Visible on 2005 colonoscopy and endoscopy 05/25/2011.    Chronic allergic rhinitis 08/19/2010     Priority: Low   Mass of buttock 08/08/2023    Vertigo 06/20/2023    Arthrofibrosis  of total knee replacement (HCC) 06/05/2023     Social History: Patient  reports that she has never smoked. She has never used smokeless tobacco. She reports that she does not drink alcohol and does not use drugs.  Review of Systems: Ophthalmic: negative for eye pain, loss of vision or double vision Cardiovascular: negative for chest pain Respiratory: negative for SOB or persistent cough Gastrointestinal: negative for abdominal pain Genitourinary: negative for dysuria or gross hematuria MSK: negative for foot lesions Neurologic: negative for weakness or gait disturbance  Objective  Vitals: BP 136/74 Comment: last week at surgeon's office  Pulse 69   Temp 97.7 F (36.5 C)   Ht 5\' 4"  (1.626 m)   Wt 196 lb 6.4 oz (89.1 kg)   SpO2 98%   BMI 33.71 kg/m  General: well appearing, no acute distress  Psych:  Alert and oriented, normal mood and affect HEENT:  Normocephalic, atraumatic, moist mucous membranes, supple neck  Cardiovascular:  Nl S1 and S2, RRR  Respiratory:  Good breath sounds bilaterally, CTAB with normal effort, no rales Ext no edema Diabetic education: ongoing education regarding chronic disease management for diabetes was given today. We continue to reinforce the ABC's of diabetic management: A1c (<7 or 8 dependent upon patient), tight blood pressure control, and cholesterol management with goal LDL < 100 minimally. We discuss diet strategies, exercise recommendations, medication options and possible side effects. At each visit, we review recommended immunizations and preventive care recommendations for diabetics and stress that good diabetic control can prevent other problems. See below for this patient's data.   Commons side effects, risks, benefits, and alternatives for medications and treatment plan prescribed today were discussed, and the patient expressed understanding of the given instructions. Patient is instructed to call or message via MyChart if he/she has any  questions or concerns regarding our treatment plan. No barriers to understanding were identified. We discussed Red Flag symptoms and signs in detail. Patient expressed understanding regarding what to do in case of urgent or emergency type symptoms.  Medication list was reconciled, printed  and provided to the patient in AVS. Patient instructions and summary information was reviewed with the patient as documented in the AVS. This note was prepared with assistance of Dragon voice recognition software. Occasional wrong-word or sound-a-like substitutions may have occurred due to the inherent limitations of voice recognition software

## 2023-10-09 ENCOUNTER — Telehealth: Payer: Self-pay | Admitting: Internal Medicine

## 2023-10-09 NOTE — Telephone Encounter (Signed)
 Follow up with BP.  Was high when called but just took meds    She needs to have follow up here in cardiology

## 2023-10-09 NOTE — Telephone Encounter (Signed)
 Pt c/o BP issue: STAT if pt c/o blurred vision, one-sided weakness or slurred speech  1. What are your last 5 BP readings?  136/74  146/74 144/78 146/74 - Most recent   2. Are you having any other symptoms (ex. Dizziness, headache, blurred vision, passed out)? Headache occasionally   3. What is your BP issue? Patient is requesting call back to discuss higher BP readings and to see if she needs to adjust her BP meds. Please advise.

## 2023-10-09 NOTE — Telephone Encounter (Signed)
 Spoke with pt and B/P this am was 181/79 only had taken meds a few minutes prior Carvedilol 3.125 mg bid Entresto 49/51 mg bid  Furosemide 20 mg mwf and sat Spironolactone 12.5 mg every day Per pt was instructed to call if no improvement with B/P

## 2023-10-10 ENCOUNTER — Encounter: Payer: Self-pay | Admitting: Family Medicine

## 2023-10-10 NOTE — Progress Notes (Signed)
 See mychart note Dear Ms. Salsbury, It was good seeing you. I'm happy to report that all of your lab results are stable. No changes are needed at this time.  Sincerely, Dr. Mardelle Matte

## 2023-10-11 NOTE — Telephone Encounter (Signed)
 Pt chart sent to scheduling to help make her a sooner appt than her 12/2023 recall. Former Dr. Shari Prows pt.

## 2023-10-17 ENCOUNTER — Encounter: Payer: Self-pay | Admitting: Surgery

## 2023-10-17 ENCOUNTER — Ambulatory Visit (INDEPENDENT_AMBULATORY_CARE_PROVIDER_SITE_OTHER): Payer: Medicare Other | Admitting: Surgery

## 2023-10-17 VITALS — BP 131/75 | HR 60 | Temp 97.5°F | Resp 14 | Ht 64.0 in | Wt 196.0 lb

## 2023-10-17 DIAGNOSIS — Z09 Encounter for follow-up examination after completed treatment for conditions other than malignant neoplasm: Secondary | ICD-10-CM

## 2023-10-17 DIAGNOSIS — L7634 Postprocedural seroma of skin and subcutaneous tissue following other procedure: Secondary | ICD-10-CM

## 2023-10-17 NOTE — Progress Notes (Unsigned)
 [facility]   HPI:  78 y.o. Female presents to clinic for follow-up of right buttock wound status post excision of right buttock mass on 1/7.  She still has some drainage from wound, but states that gait has significantly decreased.  She is able to use 1 pad per day.  She denies any fevers or chills.  The drainage is clear yellow.  Review of Systems:  All other review of systems: otherwise negative   Vital Signs:  BP 131/75   Pulse 60   Temp (!) 97.5 F (36.4 C) (Oral)   Resp 14   Ht 5\' 4"  (1.626 m)   Wt 196 lb (88.9 kg)   SpO2 95%   BMI 33.64 kg/m    Physical Exam:  Physical Exam Vitals reviewed.  Constitutional:      Appearance: Normal appearance.  Skin:    Comments: Right buttock incision site healed well with central punctate opening of 0.2 to 0.3 cm, no induration, minimal serous drainage noted, pink granulation tissue at base, nontender to palpation, no erythema   Neurological:     Mental Status: She is alert.     Laboratory studies: None  Imaging:  None  Assessment:  78 y.o. yo Female who presents for follow-up status post excision of right buttock mass on 1/7 with subsequent development of a postoperative seroma.    Plan:  -Cavity has significantly decreased in size, and punctate opening prevents further packing -Silver nitrate applied to the opening.  Discussed with the patient that given the small size of the opening with scarring, I do not feel placing a stitch will be of benefit at this time -Call the office if she begins to have fever, chills, worsening pain or redness at the incision site, or purulent drainage -Follow up with me in 3 weeks   All of the above recommendations were discussed with the patient, and all of patient's questions were answered to her expressed satisfaction.  Note: Portions of this report may have been transcribed using voice recognition software. Every effort has been made to ensure accuracy; however,  inadvertent computerized transcription errors may still be present.   Theophilus Kinds, DO [facility] [address] [place] [place], [place] [address] [phone] (office)

## 2023-10-18 ENCOUNTER — Other Ambulatory Visit: Payer: Self-pay | Admitting: Family Medicine

## 2023-10-18 MED ORDER — ASSURE ID SAFETY PEN NEEDLES 30G X 5 MM MISC: 1 refills | Status: DC

## 2023-10-18 NOTE — Telephone Encounter (Signed)
 Copied from CRM [id number]. Topic: Clinical - Medication Refill >> Oct 18, 2023  9:47 AM Desma Mcgregor wrote: Most Recent Primary Care Visit:  Provider: Willow Ora  Department: [facility]  Visit Type: OFFICE VISIT  Date: 10/06/2023  Medication: Insulin Pen Needle (ASSURE ID SAFETY PEN NEEDLES) 30G X 5 MM MISC  Has the patient contacted their pharmacy? Yes, refills ran out  Is this the correct pharmacy for this prescription? Yes If no, delete pharmacy and type the correct one.  This is the patient's preferred pharmacy:  [id number] - [place], [place] - [address] [place] [address] Phone: [phone] Fax: [phone]  Has the prescription been filled recently? Yes  Is the patient out of the medication? No  Has the patient been seen for an appointment in the last year OR does the patient have an upcoming appointment? Yes  Can we respond through MyChart? Yes  Agent: Please be advised that Rx refills may take up to 3 business days. We ask that you follow-up with your pharmacy.

## 2023-10-18 NOTE — Telephone Encounter (Signed)
 10/06/2023 LOV  04/20/2023  180/5 refills

## 2023-10-20 ENCOUNTER — Ambulatory Visit: Attending: Family Medicine | Admitting: Physical Therapy

## 2023-10-20 ENCOUNTER — Encounter: Payer: Self-pay | Admitting: Physical Therapy

## 2023-10-20 DIAGNOSIS — M48061 Spinal stenosis, lumbar region without neurogenic claudication: Secondary | ICD-10-CM | POA: Diagnosis not present

## 2023-10-20 DIAGNOSIS — M5459 Other low back pain: Secondary | ICD-10-CM | POA: Diagnosis present

## 2023-10-20 DIAGNOSIS — M62830 Muscle spasm of back: Secondary | ICD-10-CM

## 2023-10-20 DIAGNOSIS — M47816 Spondylosis without myelopathy or radiculopathy, lumbar region: Secondary | ICD-10-CM | POA: Diagnosis not present

## 2023-10-20 DIAGNOSIS — R293 Abnormal posture: Secondary | ICD-10-CM | POA: Diagnosis present

## 2023-10-20 NOTE — Therapy (Signed)
 OUTPATIENT PHYSICAL THERAPY THORACOLUMBAR EVALUATION   Patient Name: [name] MRN: [id number] DOB:[birth year], 78 y.o., female Today's Date: 10/20/2023  END OF SESSION:  PT End of Session - 10/20/23 1017     Visit Number 1    Number of Visits 12    Date for PT Re-Evaluation 12/01/23    PT Start Time 0934    PT Stop Time 1017    PT Time Calculation (min) 43 min    Activity Tolerance Patient tolerated treatment well    Behavior During Therapy [facility] for tasks assessed/performed             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) 08/24/2020   Diabetes mellitus without complication (HCC)    takes Trulicity,Jardiance,and Metformin daily.Average fasting blood sugar runs around130   Dysrhythmia    PVCs, has MVP   Fibromyalgia    GERD (gastroesophageal reflux disease)    takes Omeprazole daily   Gout 07/19/2021   On allopurinol   History of bronchitis > 8 yrs ago   History of shingles    HTN (hypertension)    takes Amlodipine and Micardis daily   Hx of colonic polyps    benign   Internal and external hemorrhoids without complication    Joint pain    Mitral valve prolapse    Neuromuscular disorder (HCC)    restless leg syndrome   Nocturia    Pneumonia    PONV (postoperative nausea and vomiting)    when gets injections in joints gets hives.Betadine rash   Restless leg syndrome    takes Requip at bedtime   Seasonal allergies    takes Claritin daily as needed   Urinary frequency    Uterine fibroid    Past Surgical History:  Procedure Laterality Date   BUNIONECTOMY Bilateral    COLONOSCOPY  07/12/2004   diverticulosis, internal and external hemorrhoids   ETHMOIDECTOMY Bilateral 08/15/2022   Procedure: ETHMOIDECTOMY WITH TISSUE REMOVAL;  Surgeon: Newman Pies, MD;  Location: [facility];  Service: ENT;  Laterality: Bilateral;   FINGER ARTHROSCOPY WITH CARPOMETACARPEL (CMC) ARTHROPLASTY Right 09/28/2015   Procedure: RIGHT THUMB TRAPEZIUM EXCISION WITH CARPOMETACARPEL (CMC) ARTHROPLASTY AND TENDON TRANSFER;  Surgeon: Bradly Bienenstock, MD;  [facility];  Service: Orthopedics;  Laterality: Right;   FRONTAL SINUS EXPLORATION Bilateral 08/15/2022   Procedure: FRONTAL RECESS SINUS EXPLORATION WITH NAVIGATION;  Surgeon: Newman Pies, MD;  Location: [facility];  Service: ENT;  Laterality: Bilateral;   HEMORRHOID SURGERY     almost 40 yrs ago   HYSTEROSCOPY WITH D & C  08/23/2000   and resectoscopic myomectomy   KNEE CLOSED REDUCTION Left 06/05/2023   Procedure: CLOSED MANIPULATION KNEE;  Surgeon: Ollen Gross, MD;  Location: [facility];  Service: Orthopedics;  Laterality: Left;   LUMBAR DISC SURGERY  03/16/2005   LUMBAR EPIDURAL INJECTION     MASS EXCISION Right 08/08/2023   Procedure: EXCISION MASS RIGHT BUTTOCKS;  Surgeon: Lewie Chamber, DO;  Location: [facility];  Service: General;  Laterality: Right;   MAXILLARY ANTROSTOMY Bilateral 08/15/2022   Procedure: ENDOSCOPIC MAXILLARY ANTROSTOMY WITH TISSUE REMOVAL;  Surgeon: Newman Pies, MD;  Location:  [facility];  Service: ENT;  Laterality: Bilateral;   PLANTAR FASCIA RELEASE  Left 02/03/2010   and torn tendon   REVERSE SHOULDER ARTHROPLASTY Right 12/14/2020   Procedure: REVERSE SHOULDER ARTHROPLASTY;  Surgeon: Beverely Low, MD;  Location: [facility];  Service: Orthopedics;  Laterality: Right;   RIGHT/LEFT HEART CATH AND CORONARY ANGIOGRAPHY N/A 09/07/2020   Procedure: RIGHT/LEFT HEART CATH AND CORONARY ANGIOGRAPHY;  Surgeon: Yvonne Kendall, MD;  [facility];  Service: Cardiovascular;  Laterality: N/A;   SINUS ENDO W/FUSION Bilateral 08/15/2022   Procedure: SPHENOIDECTOMY WITH TISSUE REMOVAL WITH NAVIGATION;  Surgeon: Newman Pies, MD;  Location: [facility];  Service: ENT;  Laterality: Bilateral;   TENDON TRANSFER Right 09/28/2015   Procedure: TENDON TRANSFER;  Surgeon: Bradly Bienenstock, MD;  [facility];  Service: Orthopedics;  Laterality: Right;   TONSILLECTOMY     TOTAL KNEE ARTHROPLASTY Left 04/24/2023   Procedure: TOTAL KNEE ARTHROPLASTY;  Surgeon: Ollen Gross, MD;  Location: [facility];  Service: Orthopedics;  Laterality: Left;   TUBAL LIGATION     TURBINATE REDUCTION Bilateral 08/15/2022   Procedure: TURBINATE REDUCTION;  Surgeon: Newman Pies, MD;  Location: [facility];  Service: ENT;  Laterality: Bilateral;   WRIST SURGERY     left, removal of cyst   Patient Active Problem List   Diagnosis Date Noted   Mass of buttock 08/08/2023   Vertigo 06/20/2023   Arthrofibrosis of total knee replacement (HCC) 06/05/2023   History of total knee arthroplasty 2024, Dr. 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   Laryngospasms 04/21/2022   Muscle tension dysphonia 04/21/2022   Old tear of meniscus of left knee 09/28/2021   Combined hyperlipidemia associated with type 2 diabetes mellitus (HCC) 09/28/2021   Gout 07/19/2021   S/P shoulder replacement, right 12/14/2020   Rotator cuff tear arthropathy 10/22/2020   Nonischemic cardiomyopathy (HCC) 10/06/2020   Congestive heart failure with left ventricular systolic dysfunction (HCC) 08/24/2020   Primary osteoarthritis of left shoulder 07/29/2020   DDD (degenerative disc disease), cervical 04/30/2020   Osteoarthritis of left knee 11/05/2018   Chronic pansinusitis 10/04/2017   Chronic low back pain 09/05/2017   Degenerative lumbar disc 06/04/2015   S/P lumbar laminectomy 06/04/2015   Restless leg syndrome 05/13/2014   Type 2 diabetes mellitus with peripheral neuropathy (HCC) 04/09/2013   Spinal stenosis of lumbar region without neurogenic claudication 09/12/2012    GERD (gastroesophageal reflux disease) 05/25/2011   Hemorrhoids, internal, with bleeding 05/25/2011   Hypertension associated with diabetes (HCC) 11/08/2010   Chronic allergic rhinitis 08/19/2010   Mitral valve prolapse 10/10/2008    REFERRING PROVIDER: Asencion Partridge  REFERRING DIAG: Spinal stenosis of lumbar region without neurogenic claudication.  Rationale for Evaluation and Treatment: Rehabilitation  THERAPY DIAG:  Other low back pain  Abnormal posture  Muscle spasm of back  ONSET DATE: Ongoing.  Per patient;  "Too long to remember."  SUBJECTIVE:  SUBJECTIVE STATEMENT: The patient presents to the clinic with chronic back pain.  She report most of her pain is on the right side.  Prolonged walking increases her pain and sitting decreases her pain.  She is not reporting any LE symptoms.  She describes the pain as an ache and throb.  Her pain interferes with her ability to walk for exercise and do housework.  She uses a cane which helps her stand straighter.    PERTINENT HISTORY:  OP.  Right TKA  PAIN:  Are you having pain?  A s above.  PRECAUTIONS: Other: OP.  RED FLAGS: None   WEIGHT BEARING RESTRICTIONS: No  FALLS:  Has patient fallen in last 6 months? Yes. Number of falls Coming off a ladder and missed last step.  LIVING ENVIRONMENT: Lives with: lives with their family Lives in: House/apartment Stairs: 14 steps; on right going up and External: 1 steps; none  Has following equipment at home: Single point cane  OCCUPATION: Retired.  PLOF: Independent  PATIENT GOALS: Decrease back pain and to be able to walk more and do more around the house.    OBJECTIVE:  Note: Objective measures were completed at Evaluation unless otherwise noted.  PATIENT SURVEYS:  Modified Oswestry  12/50   POSTURE: rounded shoulders, forward head, decreased lumbar lordosis, increased thoracic kyphosis, and flexed trunk , right knee flexion ~12 degrees, scoliosis, left-ward lateral lean/shift.    PALPATION: The patient with palpable tenderness and very taut right lumbar musculature.   LOWER EXTREMITY ROM:     In supine: Normal bilateral hip flexion.  Right knee in ~12 degrees of flexion.  LOWER EXTREMITY MMT:   Patient able to provide a 5/5 strength grade   LUMBAR SPECIAL TESTS:  (-) SLR testing.   GAIT: The patient walks in a flexed trunk posture using a straight cane.  TREATMENT DATE: 10/20/23:   HMP and IFC at 80-150 Hx on 40% scan x 15 minutes to patient's right lumbar region.     Normal modality response following removal of modality.                                                                                                                           PATIENT EDUCATION:  Education details:  Person educated:  International aid/development worker:  Education comprehension:   HOME EXERCISE PROGRAM:   ASSESSMENT:  CLINICAL IMPRESSION: The patient presents to [facility] with c/o chronic low back pain on the right.  She has multiple postural abnormalities.  Her right lumbar musculature is very taut and is tender to palpation.  Pain limits her ability to walk as much as she would like and perform ADL's.  Her Modified Owestry score is 12/50.  Her LE strength is normal.  SLR testing is negative. Patient will benefit from skilled physical therapy intervention to address pain and deficits.   OBJECTIVE IMPAIRMENTS: Abnormal gait, decreased activity tolerance, decreased ROM, increased muscle spasms, postural dysfunction, and pain.   ACTIVITY LIMITATIONS:  carrying, lifting, bending, standing, and locomotion level  PARTICIPATION LIMITATIONS: meal prep, cleaning, laundry, community activity, and yard work  PERSONAL FACTORS: Time since onset of injury/illness/exacerbation and 1-2 comorbidities: OP,  right TKA  are also affecting patient's functional outcome.   REHAB POTENTIAL: Good  CLINICAL DECISION MAKING: Evolving/moderate complexity  EVALUATION COMPLEXITY: Moderate   GOALS:  LONG TERM GOALS: Target date: 12/01/23  Ind with a HEP.  Goal status: INITIAL  2.  Walk a community distance with pain not > 4/10.  Goal status: INITIAL  3.  Perform ADL's with pain not > 4/10.  Goal status: INITIAL  PLAN:  PT FREQUENCY: 2x/week  PT DURATION: 6 weeks  PLANNED INTERVENTIONS: 97110-Therapeutic exercises, 97530- Therapeutic activity, [id number]- Neuromuscular re-education, 97535- Self Care, [id number]- Manual therapy, G0283- Electrical stimulation (unattended), 97035- Ultrasound, Patient/Family education, Dry Needling, Cryotherapy, and Moist heat.  PLAN FOR NEXT SESSION: Combo e'stim/US at 1.50 W/CM2, STW/M, core exercise progression, spinal protection techniques and body mechanics training.   Atari Novick, [place], PT 10/20/2023, 12:41 PM

## 2023-10-23 ENCOUNTER — Ambulatory Visit: Admitting: Physical Therapy

## 2023-10-23 DIAGNOSIS — M62830 Muscle spasm of back: Secondary | ICD-10-CM

## 2023-10-23 DIAGNOSIS — M5459 Other low back pain: Secondary | ICD-10-CM

## 2023-10-23 DIAGNOSIS — R293 Abnormal posture: Secondary | ICD-10-CM

## 2023-10-23 NOTE — Therapy (Signed)
 OUTPATIENT PHYSICAL THERAPY THORACOLUMBAR TREATMENT  Patient Name: [name] MRN: [id number] DOB:11-11-1945, 78 y.o., female Today's Date: 10/23/2023  END OF SESSION:  PT End of Session - 10/23/23 0926     Visit Number 2    Number of Visits 12    Date for PT Re-Evaluation 12/01/23    PT Start Time 0845    PT Stop Time 0940    PT Time Calculation (min) 55 min    Activity Tolerance Patient tolerated treatment well    Behavior During Therapy [facility] for tasks assessed/performed              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) 08/24/2020   Diabetes mellitus without complication (HCC)    takes Trulicity,Jardiance,and Metformin daily.Average fasting blood sugar runs around130   Dysrhythmia    PVCs, has MVP   Fibromyalgia    GERD (gastroesophageal reflux disease)    takes Omeprazole daily   Gout 07/19/2021   On allopurinol   History of bronchitis > 8 yrs ago   History of shingles    HTN (hypertension)    takes Amlodipine and Micardis daily   Hx of colonic polyps    benign   Internal and external hemorrhoids without complication    Joint pain    Mitral valve prolapse    Neuromuscular disorder (HCC)    restless leg syndrome   Nocturia    Pneumonia    PONV (postoperative nausea and vomiting)    when gets injections in joints gets hives.Betadine rash   Restless leg syndrome    takes Requip at bedtime   Seasonal allergies    takes Claritin daily as needed   Urinary frequency    Uterine fibroid    Past Surgical History:  Procedure Laterality Date   BUNIONECTOMY Bilateral    COLONOSCOPY  07/12/2004   diverticulosis, internal and external hemorrhoids   ETHMOIDECTOMY Bilateral 08/15/2022   Procedure: ETHMOIDECTOMY WITH TISSUE REMOVAL;  Surgeon: Newman Pies, MD;  Location: [facility];  Service: ENT;  Laterality: Bilateral;   FINGER ARTHROSCOPY WITH CARPOMETACARPEL (CMC) ARTHROPLASTY Right 09/28/2015   Procedure: RIGHT THUMB TRAPEZIUM EXCISION WITH CARPOMETACARPEL (CMC) ARTHROPLASTY AND TENDON TRANSFER;  Surgeon: Bradly Bienenstock, MD;  [facility];  Service: Orthopedics;  Laterality: Right;   FRONTAL SINUS EXPLORATION Bilateral 08/15/2022   Procedure: FRONTAL RECESS SINUS EXPLORATION WITH NAVIGATION;  Surgeon: Newman Pies, MD;  Location: [facility];  Service: ENT;  Laterality: Bilateral;   HEMORRHOID SURGERY     almost 40 yrs ago   HYSTEROSCOPY WITH D & C  08/23/2000   and resectoscopic myomectomy   KNEE CLOSED REDUCTION Left 06/05/2023   Procedure: CLOSED MANIPULATION KNEE;  Surgeon: Ollen Gross, MD;  Location: [facility];  Service: Orthopedics;  Laterality: Left;   LUMBAR DISC SURGERY  03/16/2005   LUMBAR EPIDURAL INJECTION     MASS EXCISION Right 08/08/2023   Procedure: EXCISION MASS RIGHT BUTTOCKS;  Surgeon: Lewie Chamber, DO;  Location: [facility];  Service: General;  Laterality: Right;   MAXILLARY ANTROSTOMY Bilateral 08/15/2022   Procedure: ENDOSCOPIC MAXILLARY ANTROSTOMY WITH TISSUE REMOVAL;  Surgeon: Newman Pies, MD;  Location: [facility];  Service: ENT;  Laterality: Bilateral;   PLANTAR FASCIA RELEASE  Left 02/03/2010   and torn tendon   REVERSE SHOULDER ARTHROPLASTY Right 12/14/2020   Procedure: REVERSE SHOULDER ARTHROPLASTY;  Surgeon: Beverely Low, MD;  Location: [facility];  Service: Orthopedics;  Laterality: Right;   RIGHT/LEFT HEART CATH AND CORONARY ANGIOGRAPHY N/A 09/07/2020   Procedure: RIGHT/LEFT HEART CATH AND CORONARY ANGIOGRAPHY;  Surgeon: Yvonne Kendall, MD;  [facility];  Service: Cardiovascular;  Laterality: N/A;   SINUS ENDO W/FUSION Bilateral 08/15/2022   Procedure: SPHENOIDECTOMY WITH TISSUE REMOVAL WITH NAVIGATION;  Surgeon: Newman Pies, MD;  Location: [facility];  Service: ENT;  Laterality: Bilateral;   TENDON TRANSFER Right 09/28/2015   Procedure: TENDON TRANSFER;  Surgeon: Bradly Bienenstock, MD;  [facility];  Service: Orthopedics;  Laterality: Right;   TONSILLECTOMY     TOTAL KNEE ARTHROPLASTY Left 04/24/2023   Procedure: TOTAL KNEE ARTHROPLASTY;  Surgeon: Ollen Gross, MD;  Location[facility];  Service: Orthopedics;  Laterality: Left;   TUBAL LIGATION     TURBINATE REDUCTION Bilateral 08/15/2022   Procedure: TURBINATE REDUCTION;  Surgeon: Newman Pies, MD;  Location: [facility];  Service: ENT;  Laterality: Bilateral;   WRIST SURGERY     left, removal of cyst   Patient Active Problem List   Diagnosis Date Noted   Mass of buttock 08/08/2023   Vertigo 06/20/2023   Arthrofibrosis of total knee replacement (HCC) 06/05/2023   History of total knee arthroplasty 2024, Dr. 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   Laryngospasms 04/21/2022   Muscle tension dysphonia 04/21/2022   Old tear of meniscus of left knee 09/28/2021   Combined hyperlipidemia associated with type 2 diabetes mellitus (HCC) 09/28/2021   Gout 07/19/2021   S/P shoulder replacement, right 12/14/2020   Rotator cuff tear arthropathy 10/22/2020   Nonischemic cardiomyopathy (HCC) 10/06/2020   Congestive heart failure with left ventricular systolic dysfunction (HCC) 08/24/2020   Primary osteoarthritis of left shoulder 07/29/2020   DDD (degenerative disc disease), cervical 04/30/2020   Osteoarthritis of left knee 11/05/2018   Chronic pansinusitis 10/04/2017   Chronic low back pain 09/05/2017   Degenerative lumbar disc 06/04/2015   S/P lumbar laminectomy 06/04/2015   Restless leg syndrome 05/13/2014   Type 2 diabetes mellitus with peripheral neuropathy (HCC) 04/09/2013   Spinal stenosis of lumbar region without neurogenic claudication 09/12/2012    GERD (gastroesophageal reflux disease) 05/25/2011   Hemorrhoids, internal, with bleeding 05/25/2011   Hypertension associated with diabetes (HCC) 11/08/2010   Chronic allergic rhinitis 08/19/2010   Mitral valve prolapse 10/10/2008    REFERRING PROVIDER: Asencion Partridge  REFERRING DIAG: Spinal stenosis of lumbar region without neurogenic claudication.  Rationale for Evaluation and Treatment: Rehabilitation  THERAPY DIAG:  Other low back pain  Abnormal posture  Muscle spasm of back  ONSET DATE: Ongoing.  Per patient;  "Too long to remember."  SUBJECTIVE:  SUBJECTIVE STATEMENT: Pain at a 4.  PERTINENT HISTORY:  OP.  Right TKA  PAIN:  Are you having pain? 4/10. Ache, throb.  PRECAUTIONS: Other: OP.  RED FLAGS: None   WEIGHT BEARING RESTRICTIONS: No  FALLS:  Has patient fallen in last 6 months? Yes. Number of falls Coming off a ladder and missed last step.  LIVING ENVIRONMENT: Lives with: lives with their family Lives in: House/apartment Stairs: 14 steps; on right going up and External: 1 steps; none  Has following equipment at home: Single point cane  OCCUPATION: Retired.  PLOF: Independent  PATIENT GOALS: Decrease back pain and to be able to walk more and do more around the house.    OBJECTIVE:  Note: Objective measures were completed at Evaluation unless otherwise noted.  PATIENT SURVEYS:  Modified Oswestry 12/50   POSTURE: rounded shoulders, forward head, decreased lumbar lordosis, increased thoracic kyphosis, and flexed trunk , right knee flexion ~12 degrees, scoliosis, left-ward lateral lean/shift.    PALPATION: The patient with palpable tenderness and very taut right lumbar musculature.   LOWER EXTREMITY ROM:     In supine: Normal bilateral hip flexion.  Right knee  in ~12 degrees of flexion.  LOWER EXTREMITY MMT:   Patient able to provide a 5/5 strength grade   LUMBAR SPECIAL TESTS:  (-) SLR testing.   GAIT: The patient walks in a flexed trunk posture using a straight cane.  TREATMENT DATE:   10/23/23:  Nustep level 4 x 16 minutes f/b patient in left SDLY position with folded pillow between knees for comfort:   STW/M with ischemic release technique utilized x 9 minutes to patient's right lumbar musculature f/b HMP and IFC at 80-150 Hx on 40% scan x 15 minutes to patient's right lumbar region.     Normal modality response following removal of modality.                                                                                                                      PATIENT EDUCATION:  Education details:  Person educated:  International aid/development worker:  Education comprehension:   HOME EXERCISE PROGRAM:   ASSESSMENT:  CLINICAL IMPRESSION: Patient's right lumbar musculature very taut to palpation.  She tolerated soft tissue work without complaint and felt better following.    OBJECTIVE IMPAIRMENTS: Abnormal gait, decreased activity tolerance, decreased ROM, increased muscle spasms, postural dysfunction, and pain.   ACTIVITY LIMITATIONS: carrying, lifting, bending, standing, and locomotion level  PARTICIPATION LIMITATIONS: meal prep, cleaning, laundry, community activity, and yard work  PERSONAL FACTORS: Time since onset of injury/illness/exacerbation and 1-2 comorbidities: OP, right TKA  are also affecting patient's functional outcome.   REHAB POTENTIAL: Good  CLINICAL DECISION MAKING: Evolving/moderate complexity  EVALUATION COMPLEXITY: Moderate   GOALS:  LONG TERM GOALS: Target date: 12/01/23  Ind with a HEP.  Goal status: INITIAL  2.  Walk a community distance with pain not > 4/10.  Goal status: INITIAL  3.  Perform ADL's with pain not > 4/10.  Goal status: INITIAL  PLAN:  PT FREQUENCY: 2x/week  PT DURATION: 6 weeks  PLANNED  INTERVENTIONS: 97110-Therapeutic exercises, 97530- Therapeutic activity, [id number]- Neuromuscular re-education, 97535- Self Care, 65784- Manual therapy, G0283- Electrical stimulation (unattended), 97035- Ultrasound, Patient/Family education, Dry Needling, Cryotherapy, and Moist heat.  PLAN FOR NEXT SESSION: Combo e'stim/US at 1.50 W/CM2, STW/M, core exercise progression, spinal protection techniques and body mechanics training.   Rawn Quiroa, [place], PT 10/23/2023, 10:51 AM

## 2023-10-25 ENCOUNTER — Ambulatory Visit

## 2023-10-25 DIAGNOSIS — M5459 Other low back pain: Secondary | ICD-10-CM | POA: Diagnosis not present

## 2023-10-25 DIAGNOSIS — M62830 Muscle spasm of back: Secondary | ICD-10-CM

## 2023-10-25 DIAGNOSIS — R293 Abnormal posture: Secondary | ICD-10-CM

## 2023-10-25 NOTE — Therapy (Signed)
 OUTPATIENT PHYSICAL THERAPY THORACOLUMBAR TREATMENT  Patient Name: [name] MRN: [id number] DOB:09-06-1945, 78 y.o., female Today's Date: 10/25/2023  END OF SESSION:  PT End of Session - 10/25/23 0809     Visit Number 3    Number of Visits 12    Date for PT Re-Evaluation 12/01/23    PT Start Time 0803    PT Stop Time 0900    PT Time Calculation (min) 57 min    Activity Tolerance Patient tolerated treatment well    Behavior During Therapy [facility] for tasks assessed/performed               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) 08/24/2020   Diabetes mellitus without complication (HCC)    takes Trulicity,Jardiance,and Metformin daily.Average fasting blood sugar runs around130   Dysrhythmia    PVCs, has MVP   Fibromyalgia    GERD (gastroesophageal reflux disease)    takes Omeprazole daily   Gout 07/19/2021   On allopurinol   History of bronchitis > 8 yrs ago   History of shingles    HTN (hypertension)    takes Amlodipine and Micardis daily   Hx of colonic polyps    benign   Internal and external hemorrhoids without complication    Joint pain    Mitral valve prolapse    Neuromuscular disorder (HCC)    restless leg syndrome   Nocturia    Pneumonia    PONV (postoperative nausea and vomiting)    when gets injections in joints gets hives.Betadine rash   Restless leg syndrome    takes Requip at bedtime   Seasonal allergies    takes Claritin daily as needed   Urinary frequency    Uterine fibroid    Past Surgical History:  Procedure Laterality Date   BUNIONECTOMY Bilateral    COLONOSCOPY  07/12/2004   diverticulosis, internal and external hemorrhoids   ETHMOIDECTOMY Bilateral 08/15/2022   Procedure: ETHMOIDECTOMY WITH TISSUE REMOVAL;  Surgeon: Newman Pies, MD;  Location: [facility];  Service: ENT;  Laterality: Bilateral;   FINGER ARTHROSCOPY WITH CARPOMETACARPEL (CMC) ARTHROPLASTY Right 09/28/2015   Procedure: RIGHT THUMB TRAPEZIUM EXCISION WITH CARPOMETACARPEL (CMC) ARTHROPLASTY AND TENDON TRANSFER;  Surgeon: Bradly Bienenstock, MD;  Location: [facility];  Service: Orthopedics;  Laterality: Right;   FRONTAL SINUS EXPLORATION Bilateral 08/15/2022   Procedure: FRONTAL RECESS SINUS EXPLORATION WITH NAVIGATION;  Surgeon: Newman Pies, MD;  Location: [facility];  Service: ENT;  Laterality: Bilateral;   HEMORRHOID SURGERY     almost 40 yrs ago   HYSTEROSCOPY WITH D & C  08/23/2000   and resectoscopic myomectomy   KNEE CLOSED REDUCTION Left 06/05/2023   Procedure: CLOSED MANIPULATION KNEE;  Surgeon: Ollen Gross, MD;  Location: [facility];  Service: Orthopedics;  Laterality: Left;   LUMBAR DISC SURGERY  03/16/2005   LUMBAR EPIDURAL INJECTION     MASS EXCISION Right 08/08/2023   Procedure: EXCISION MASS RIGHT BUTTOCKS;  Surgeon: Lewie Chamber, DO;  Location: [facility];  Service: General;  Laterality: Right;   MAXILLARY ANTROSTOMY Bilateral 08/15/2022   Procedure: ENDOSCOPIC MAXILLARY ANTROSTOMY WITH TISSUE REMOVAL;  Surgeon: Newman Pies, MD;  Location: [facility];  Service: ENT;  Laterality: Bilateral;   PLANTAR FASCIA  RELEASE Left 02/03/2010   and torn tendon   REVERSE SHOULDER ARTHROPLASTY Right 12/14/2020   Procedure: REVERSE SHOULDER ARTHROPLASTY;  Surgeon: Beverely Low, MD;  Location: [facility];  Service: Orthopedics;  Laterality: Right;   RIGHT/LEFT HEART CATH AND CORONARY ANGIOGRAPHY N/A 09/07/2020   Procedure: RIGHT/LEFT HEART CATH AND CORONARY ANGIOGRAPHY;  Surgeon: Yvonne Kendall, MD;  Location: [facility];  Service: Cardiovascular;  Laterality: N/A;   SINUS ENDO W/FUSION Bilateral 08/15/2022   Procedure: SPHENOIDECTOMY WITH TISSUE REMOVAL WITH NAVIGATION;  Surgeon: Newman Pies, MD;  Location: [facility];  Service: ENT;  Laterality: Bilateral;   TENDON TRANSFER Right 09/28/2015   Procedure: TENDON TRANSFER;  Surgeon: Bradly Bienenstock, MD;  Location: [facility];  Service: Orthopedics;  Laterality: Right;   TONSILLECTOMY     TOTAL KNEE ARTHROPLASTY Left 04/24/2023   Procedure: TOTAL KNEE ARTHROPLASTY;  Surgeon: Ollen Gross, MD;  Location: [facility];  Service: Orthopedics;  Laterality: Left;   TUBAL LIGATION     TURBINATE REDUCTION Bilateral 08/15/2022   Procedure: TURBINATE REDUCTION;  Surgeon: Newman Pies, MD;  Location: [facility];  Service: ENT;  Laterality: Bilateral;   WRIST SURGERY     left, removal of cyst   Patient Active Problem List   Diagnosis Date Noted   Mass of buttock 08/08/2023   Vertigo 06/20/2023   Arthrofibrosis of total knee replacement (HCC) 06/05/2023   History of total knee arthroplasty 2024, Dr. 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   Laryngospasms 04/21/2022   Muscle tension dysphonia 04/21/2022   Old tear of meniscus of left knee 09/28/2021   Combined hyperlipidemia associated with type 2 diabetes mellitus (HCC) 09/28/2021   Gout 07/19/2021   S/P shoulder replacement, right 12/14/2020   Rotator cuff tear arthropathy 10/22/2020   Nonischemic cardiomyopathy (HCC) 10/06/2020   Congestive heart failure with left ventricular systolic dysfunction (HCC) 08/24/2020   Primary osteoarthritis of left shoulder 07/29/2020   DDD (degenerative disc disease), cervical 04/30/2020   Osteoarthritis of left knee 11/05/2018   Chronic pansinusitis 10/04/2017   Chronic low back pain 09/05/2017   Degenerative lumbar disc 06/04/2015   S/P lumbar laminectomy 06/04/2015   Restless leg syndrome 05/13/2014   Type 2 diabetes mellitus with peripheral neuropathy (HCC) 04/09/2013   Spinal stenosis of lumbar region without neurogenic claudication 09/12/2012    GERD (gastroesophageal reflux disease) 05/25/2011   Hemorrhoids, internal, with bleeding 05/25/2011   Hypertension associated with diabetes (HCC) 11/08/2010   Chronic allergic rhinitis 08/19/2010   Mitral valve prolapse 10/10/2008    REFERRING PROVIDER: Asencion Partridge  REFERRING DIAG: Spinal stenosis of lumbar region without neurogenic claudication.  Rationale for Evaluation and Treatment: Rehabilitation  THERAPY DIAG:  Other low back pain  Abnormal posture  Muscle spasm of back  ONSET DATE: Ongoing.  Per patient;  "Too long to remember."  SUBJECTIVE:  SUBJECTIVE STATEMENT: Patient reports that she is not hurting too bad today.   PERTINENT HISTORY:  OP.  Right TKA  PAIN:  Are you having pain? 3/10. Ache, throb.  PRECAUTIONS: Other: OP.  RED FLAGS: None   WEIGHT BEARING RESTRICTIONS: No  FALLS:  Has patient fallen in last 6 months? Yes. Number of falls Coming off a ladder and missed last step.  LIVING ENVIRONMENT: Lives with: lives with their family Lives in: House/apartment Stairs: 14 steps; on right going up and External: 1 steps; none  Has following equipment at home: Single point cane  OCCUPATION: Retired.  PLOF: Independent  PATIENT GOALS: Decrease back pain and to be able to walk more and do more around the house.    OBJECTIVE:  Note: Objective measures were completed at Evaluation unless otherwise noted.  PATIENT SURVEYS:  Modified Oswestry 12/50   POSTURE: rounded shoulders, forward head, decreased lumbar lordosis, increased thoracic kyphosis, and flexed trunk , right knee flexion ~12 degrees, scoliosis, left-ward lateral lean/shift.    PALPATION: The patient with palpable tenderness and very taut right lumbar musculature.   LOWER EXTREMITY ROM:     In supine:  Normal bilateral hip flexion.  Right knee in ~12 degrees of flexion.  LOWER EXTREMITY MMT:   Patient able to provide a 5/5 strength grade   LUMBAR SPECIAL TESTS:  (-) SLR testing.   GAIT: The patient walks in a flexed trunk posture using a straight cane.  TREATMENT DATE:                                    10/25/23 EXERCISE LOG  Exercise Repetitions and Resistance Comments  Nustep  L3-4 x 17 minutes   Seated hip ADD isometric  3 minutes w/ 5 second hold   LAQ 3# x 2 minutes  Alternating LE   Seated marching  3# x 2.5 minutes  Alternating LE   Standing hip hike  25 reps each    Isometric ball press  3 minutes w/ 5 second    Rocker board  4 minutes    Blank cell = exercise not performed today   10/23/23:  Nustep level 4 x 16 minutes f/b patient in left SDLY position with folded pillow between knees for comfort:   STW/M with ischemic release technique utilized x 9 minutes to patient's right lumbar musculature f/b HMP and IFC at 80-150 Hx on 40% scan x 15 minutes to patient's right lumbar region.     Normal modality response following removal of modality.                                                                                                                      PATIENT EDUCATION:  Education details:  Person educated:  International aid/development worker:  Education comprehension:   HOME EXERCISE PROGRAM:   ASSESSMENT:  CLINICAL IMPRESSION: Patient was introduced to multiple new interventions for improved lumbar and lower  extremity muscular strength. She required minimal cueing with today's new interventions for proper exercise performance. She experienced no increase in pain or discomfort with any of today's interventions. She reported feeling good upon the conclusion of treatment. She continues to require skilled physical therapy to address her remaining impairments to maximize her functional mobility.    OBJECTIVE IMPAIRMENTS: Abnormal gait, decreased activity tolerance, decreased ROM,  increased muscle spasms, postural dysfunction, and pain.   ACTIVITY LIMITATIONS: carrying, lifting, bending, standing, and locomotion level  PARTICIPATION LIMITATIONS: meal prep, cleaning, laundry, community activity, and yard work  PERSONAL FACTORS: Time since onset of injury/illness/exacerbation and 1-2 comorbidities: OP, right TKA  are also affecting patient's functional outcome.   REHAB POTENTIAL: Good  CLINICAL DECISION MAKING: Evolving/moderate complexity  EVALUATION COMPLEXITY: Moderate   GOALS:  LONG TERM GOALS: Target date: 12/01/23  Ind with a HEP.  Goal status: INITIAL  2.  Walk a community distance with pain not > 4/10.  Goal status: INITIAL  3.  Perform ADL's with pain not > 4/10.  Goal status: INITIAL  PLAN:  PT FREQUENCY: 2x/week  PT DURATION: 6 weeks  PLANNED INTERVENTIONS: 97110-Therapeutic exercises, 97530- Therapeutic activity, [id number]- Neuromuscular re-education, 97535- Self Care, 78469- Manual therapy, [id number]- Electrical stimulation (unattended), 97035- Ultrasound, Patient/Family education, Dry Needling, Cryotherapy, and Moist heat.  PLAN FOR NEXT SESSION: Combo e'stim/US at 1.50 W/CM2, STW/M, core exercise progression, spinal protection techniques and body mechanics training.   Granville Lewis, PT 10/25/2023, 12:41 PM

## 2023-10-30 ENCOUNTER — Ambulatory Visit: Admitting: Physical Therapy

## 2023-10-30 ENCOUNTER — Encounter: Payer: Self-pay | Admitting: Physician Assistant

## 2023-10-30 DIAGNOSIS — I2510 Atherosclerotic heart disease of native coronary artery without angina pectoris: Secondary | ICD-10-CM | POA: Insufficient documentation

## 2023-10-30 DIAGNOSIS — M5459 Other low back pain: Secondary | ICD-10-CM

## 2023-10-30 DIAGNOSIS — I493 Ventricular premature depolarization: Secondary | ICD-10-CM | POA: Insufficient documentation

## 2023-10-30 DIAGNOSIS — M62830 Muscle spasm of back: Secondary | ICD-10-CM

## 2023-10-30 DIAGNOSIS — E7800 Pure hypercholesterolemia, unspecified: Secondary | ICD-10-CM | POA: Insufficient documentation

## 2023-10-30 DIAGNOSIS — R293 Abnormal posture: Secondary | ICD-10-CM

## 2023-10-30 HISTORY — DX: Atherosclerotic heart disease of native coronary artery without angina pectoris: I25.10

## 2023-10-30 NOTE — Progress Notes (Unsigned)
 Cardiology Office Note:    Date:  10/31/2023  ID:  [name], DOB Jul 23, 1946, MRN [id number] PCP: Willow Ora, MD   [facility] Providers Cardiologist:  Dietrich Pates, MD       Patient Profile:      Coronary artery disease (nonobstructive) LHC 09/07/20: mid LAD 10-20%, otherwise no significant CAD Heart failure with mildly reduced ejection fraction Non-ischemic cardiomyopathy (dx during admission for COVID in 2022) TTE (08/14/2020): EF 20-25% TTE (10/15/2020): EF 30-35% TTE (01/26/2021): EF 45% TTE (01/10/2022): EF 45-50%, Grade 1 diastolic dysfunction, Normal RVSF, Trivial MR, Mitral valve prolapse Hypertension Hyperlipidemia Chronic kidney disease with a GFR of 41 Diabetes mellitus PVCs  10% burden (monitor 2022)           Discussed the use of AI scribe software for clinical note transcription with the patient, who gave verbal consent to proceed.  History of Present Illness [name] is a 78 y.o. female who was previously a patient of Dr. Shari Prows. She established with Dr. Tenny Craw in October 2024. She called in recently for elevated BPs. She is seen for further evaluation.   She has been experiencing elevated blood pressure with readings ranging from 168/80 mmHg to 131/75 mmHg. She monitors her blood pressure every two to three days. No new medications or significant changes in her condition that could contribute to the elevated readings. She has noted left-sided chest pain that sometimes radiates to her left axilla and upper arm.  This typically occurs in the morning or after eating. The pain lasts about 30 minutes and is relieved by Gas-X tablets. No new medications or significant changes in her condition.  She notes shortness of breath with exertion, but no worsening of this symptom. No chest pressure, heaviness, or tightness associated with exertion. No swelling in her legs or orthopnea. She does not smoke.   Review of Systems  HENT:  Positive for congestion.    Respiratory:  Positive for cough.   Gastrointestinal:  Negative for hematochezia and melena.  Genitourinary:  Negative for hematuria.  -See HPI    Studies Reviewed:   EKG Interpretation Date/Time:  Tuesday October 31 2023 09:10:54 EDT Ventricular Rate:  59 PR Interval:  200 QRS Duration:  86 QT Interval:  396 QTC Calculation: 392 R Axis:   55  Text Interpretation: Sinus bradycardia No significant change since last tracing Confirmed by Tereso Newcomer [phone]) on 10/31/2023 9:12:28 AM    Results LABS Total cholesterol: 119 (10/06/2023) HDL: 58.5 (10/06/2023) LDL: 38 (10/06/2023) Triglycerides: 111 (10/06/2023) Hemoglobin: 11.6 (10/06/2023) Creatinine: 1.26 (10/06/2023) Potassium: 4.5 (10/06/2023) ALT: 13 (10/06/2023) TSH: 2.18 (10/06/2023)    Risk Assessment/Calculations:     HYPERTENSION CONTROL Vitals:   10/31/23 0850 10/31/23 0918  BP: (!) 163/62 (!) 160/80    The patient's blood pressure is elevated above target today.  In order to address the patient's elevated BP: A current anti-hypertensive medication was adjusted today.          Physical Exam:   VS:  BP (!) 160/80   Pulse 68   Ht 5\' 4"  (1.626 m)   Wt 197 lb 9.6 oz (89.6 kg)   SpO2 96%   BMI 33.92 kg/m    Wt Readings from Last 3 Encounters:  10/31/23 197 lb 9.6 oz (89.6 kg)  10/17/23 196 lb (88.9 kg)  10/06/23 196 lb 6.4 oz (89.1 kg)    Constitutional:      Appearance: Healthy appearance. Not in distress.  Neck:  Vascular: JVD normal.  Pulmonary:     Breath sounds: Normal breath sounds. No wheezing. No rales.  Cardiovascular:     Normal rate. Irregular rhythm.     Murmurs: There is a grade 1/6 systolic murmur at the URSB.  Edema:    Peripheral edema present.    Pretibial: bilateral trace edema of the pretibial area. Abdominal:     Palpations: Abdomen is soft.        Assessment and Plan:   Assessment & Plan Hypertension associated with diabetes (HCC) Blood pressure uncontrolled. -  Increase Entresto to 97/103 mg twice daily.   - Monitor blood pressure regularly. - BMET 2 weeks - Continue carvedilol 3.125 mg twice daily, spironolactone 12.5 mg daily Coronary artery disease involving native coronary artery of native heart without angina pectoris Mild non-obstructive coronary artery disease with mid LAD 10-20% stenosis noted in 2022. She notes recent intermittent left-sided chest pain, possibly cardiac versus gastrointestinal.  EKG today without acute changes. - Schedule a Lexiscan MPI  Heart failure with mildly reduced ejection fraction (HFmrEF, 41-49%) (HCC) Nonischemic cardiomyopathy.  NYHA II.  Volume status stable.  EF improved from 20-25% in 2022 to 45-50% by June 2023.   - Continue Farxiga 10 mg daily, carvedilol 3.25 mg twice daily, spironolactone 12.5 mg daily - Increase Entresto for better blood pressure control as noted Pure hypercholesterolemia LDL optimal on recent labs - Continue Crestor 20 mg daily.      Informed Consent   Shared Decision Making/Informed Consent The risks [chest pain, shortness of breath, cardiac arrhythmias, dizziness, blood pressure fluctuations, myocardial infarction, stroke/transient ischemic attack, nausea, vomiting, allergic reaction, radiation exposure, metallic taste sensation and life-threatening complications (estimated to be 1 in 10,000)], benefits (risk stratification, diagnosing coronary artery disease, treatment guidance) and alternatives of a nuclear stress test were discussed in detail with Ms. Obey and she agrees to proceed.     Dispo:  Return in about 6 weeks (around 12/12/2023) for Routine Follow Up, w/ Tereso Newcomer, PA-C.  Signed, Tereso Newcomer, PA-C

## 2023-10-30 NOTE — Therapy (Signed)
 OUTPATIENT PHYSICAL THERAPY THORACOLUMBAR TREATMENT  Patient Name: NIKIAH GOIN MRN: [id number] DOB:[birth year], 78 y.o., female Today's Date: 10/30/2023  END OF SESSION:  PT End of Session - 10/30/23 0944     Visit Number 4    Number of Visits 12    Date for PT Re-Evaluation 12/01/23    PT Start Time 0930    PT Stop Time 1024    PT Time Calculation (min) 54 min    Activity Tolerance Patient tolerated treatment well    Behavior During Therapy [facility] for tasks assessed/performed               Past Medical History:  Diagnosis Date   Anxiety    Arthritis    CAD (coronary artery disease) 10/30/2023   LHC 09/07/20: mid LAD 10-20%, otherwise no significant CAD    Chronic back pain    buldging disc,scoliosis,arthritis   Chronic kidney disease    CKD3a   Claustrophobia    Congestive heart failure with left ventricular systolic dysfunction (HCC) 08/24/2020   Diabetes mellitus without complication (HCC)    takes Trulicity,Jardiance,and Metformin daily.Average fasting blood sugar runs around130   Dysrhythmia    PVCs, has MVP   Fibromyalgia    GERD (gastroesophageal reflux disease)    takes Omeprazole daily   Gout 07/19/2021   On allopurinol   History of bronchitis > 8 yrs ago   History of shingles    HTN (hypertension)    takes Amlodipine and Micardis daily   Hx of colonic polyps    benign   Internal and external hemorrhoids without complication    Joint pain    Mitral valve prolapse    Neuromuscular disorder (HCC)    restless leg syndrome   Nocturia    Pneumonia    PONV (postoperative nausea and vomiting)    when gets injections in joints gets hives.Betadine rash   Restless leg syndrome    takes Requip at bedtime   Seasonal allergies    takes Claritin daily as needed   Urinary frequency    Uterine fibroid    Past Surgical History:  Procedure Laterality Date   BUNIONECTOMY Bilateral    COLONOSCOPY  07/12/2004   diverticulosis, internal and external  hemorrhoids   ETHMOIDECTOMY Bilateral 08/15/2022   Procedure: ETHMOIDECTOMY WITH TISSUE REMOVAL;  Surgeon: Newman Pies, MD;  Location: [facility];  Service: ENT;  Laterality: Bilateral;   FINGER ARTHROSCOPY WITH CARPOMETACARPEL (CMC) ARTHROPLASTY Right 09/28/2015   Procedure: RIGHT THUMB TRAPEZIUM EXCISION WITH CARPOMETACARPEL (CMC) ARTHROPLASTY AND TENDON TRANSFER;  Surgeon: Bradly Bienenstock, MD;  Location: [facility];  Service: Orthopedics;  Laterality: Right;   FRONTAL SINUS EXPLORATION Bilateral 08/15/2022   Procedure: FRONTAL RECESS SINUS EXPLORATION WITH NAVIGATION;  Surgeon: Newman Pies, MD;  Location: [facility];  Service: ENT;  Laterality: Bilateral;   HEMORRHOID SURGERY     almost 40 yrs ago   HYSTEROSCOPY WITH D & C  08/23/2000   and resectoscopic myomectomy   KNEE CLOSED REDUCTION Left 06/05/2023   Procedure: CLOSED MANIPULATION KNEE;  Surgeon: Ollen Gross, MD;  Location: [facility];  Service: Orthopedics;  Laterality: Left;   LUMBAR DISC SURGERY  03/16/2005   LUMBAR EPIDURAL INJECTION     MASS EXCISION Right 08/08/2023   Procedure: EXCISION MASS RIGHT BUTTOCKS;  Surgeon: Lewie Chamber, DO;  Location: [facility];  Service: General;  Laterality: Right;   MAXILLARY ANTROSTOMY Bilateral 08/15/2022   Procedure: ENDOSCOPIC MAXILLARY ANTROSTOMY WITH TISSUE REMOVAL;  Surgeon:  Newman Pies, MD;  Location: [facility];  Service: ENT;  Laterality: Bilateral;   PLANTAR FASCIA RELEASE Left 02/03/2010   and torn tendon   REVERSE SHOULDER ARTHROPLASTY Right 12/14/2020   Procedure: REVERSE SHOULDER ARTHROPLASTY;  Surgeon: Beverely Low, MD;  Location: [facility];  Service: Orthopedics;  Laterality: Right;   RIGHT/LEFT HEART CATH AND CORONARY ANGIOGRAPHY N/A 09/07/2020   Procedure: RIGHT/LEFT HEART CATH AND CORONARY ANGIOGRAPHY;  Surgeon: Yvonne Kendall, MD;  [facility];  Service: Cardiovascular;  Laterality: N/A;   SINUS ENDO W/FUSION Bilateral  08/15/2022   Procedure: SPHENOIDECTOMY WITH TISSUE REMOVAL WITH NAVIGATION;  Surgeon: Newman Pies, MD;  Location: [facility];  Service: ENT;  Laterality: Bilateral;   TENDON TRANSFER Right 09/28/2015   Procedure: TENDON TRANSFER;  Surgeon: Bradly Bienenstock, MD;  Location: [facility];  Service: Orthopedics;  Laterality: Right;   TONSILLECTOMY     TOTAL KNEE ARTHROPLASTY Left 04/24/2023   Procedure: TOTAL KNEE ARTHROPLASTY;  Surgeon: Ollen Gross, MD;  Location: [facility];  Service: Orthopedics;  Laterality: Left;   TUBAL LIGATION     TURBINATE REDUCTION Bilateral 08/15/2022   Procedure: TURBINATE REDUCTION;  Surgeon: Newman Pies, MD;  Location: [facility];  Service: ENT;  Laterality: Bilateral;   WRIST SURGERY     left, removal of cyst   Patient Active Problem List   Diagnosis Date Noted   Pure hypercholesterolemia 10/30/2023   PVC's (premature ventricular contractions) 10/30/2023   CAD (coronary artery disease) 10/30/2023   Mass of buttock 08/08/2023   Vertigo 06/20/2023   Arthrofibrosis of total knee replacement (HCC) 06/05/2023   History of total knee arthroplasty 2024, Dr. 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   Laryngospasms 04/21/2022   Muscle tension dysphonia 04/21/2022   Old tear of meniscus of left knee 09/28/2021   Combined hyperlipidemia associated with type 2 diabetes mellitus (HCC) 09/28/2021   Gout 07/19/2021   S/P shoulder replacement, right 12/14/2020   Rotator cuff tear arthropathy 10/22/2020   Nonischemic cardiomyopathy (HCC) 10/06/2020   Heart failure with mildly reduced ejection fraction (HFmrEF, 41-49%) (HCC) 08/24/2020   Primary osteoarthritis of left shoulder 07/29/2020   DDD (degenerative disc disease), cervical 04/30/2020   Osteoarthritis of left knee 11/05/2018   Chronic pansinusitis 10/04/2017   Chronic low back pain 09/05/2017    Degenerative lumbar disc 06/04/2015   S/P lumbar laminectomy 06/04/2015   Restless leg syndrome 05/13/2014   Type 2 diabetes mellitus with peripheral neuropathy (HCC) 04/09/2013   Spinal stenosis of lumbar region without neurogenic claudication 09/12/2012   GERD (gastroesophageal reflux disease) 05/25/2011   Hemorrhoids, internal, with bleeding 05/25/2011   Hypertension associated with diabetes (HCC) 11/08/2010   Chronic allergic rhinitis 08/19/2010   Mitral valve prolapse 10/10/2008    REFERRING PROVIDER: Asencion Partridge  REFERRING DIAG: Spinal stenosis of lumbar region without neurogenic claudication.  Rationale for Evaluation and Treatment: Rehabilitation  THERAPY DIAG:  Other low back pain  Abnormal posture  Muscle spasm of back  ONSET DATE: Ongoing.  Per patient;  "Too long to remember."  SUBJECTIVE:  SUBJECTIVE STATEMENT: Doing better.    PERTINENT HISTORY:  OP.  Right TKA  PAIN:  Are you having pain? 3/10. Ache, throb.  PRECAUTIONS: Other: OP.  RED FLAGS: None   WEIGHT BEARING RESTRICTIONS: No  FALLS:  Has patient fallen in last 6 months? Yes. Number of falls Coming off a ladder and missed last step.  LIVING ENVIRONMENT: Lives with: lives with their family Lives in: House/apartment Stairs: 14 steps; on right going up and External: 1 steps; none  Has following equipment at home: Single point cane  OCCUPATION: Retired.  PLOF: Independent  PATIENT GOALS: Decrease back pain and to be able to walk more and do more around the house.    OBJECTIVE:  Note: Objective measures were completed at Evaluation unless otherwise noted.  PATIENT SURVEYS:  Modified Oswestry 12/50   POSTURE: rounded shoulders, forward head, decreased lumbar lordosis, increased thoracic kyphosis, and  flexed trunk , right knee flexion ~12 degrees, scoliosis, left-ward lateral lean/shift.    PALPATION: The patient with palpable tenderness and very taut right lumbar musculature.   LOWER EXTREMITY ROM:     In supine: Normal bilateral hip flexion.  Right knee in ~12 degrees of flexion.  LOWER EXTREMITY MMT:   Patient able to provide a 5/5 strength grade   LUMBAR SPECIAL TESTS:  (-) SLR testing.   GAIT: The patient walks in a flexed trunk posture using a straight cane.  TREATMENT DATE:   10/30/23:  Nustep level 4 x 17 minutes f/b Patient in left SDLY position with folded pillow between knees for comfort:  STW/M x 8 minutes including ischemic release technique with focus on right QL f/b HMP and IFC at 80-150 Hx on 40% scan x 20 minutes to patient's right lumbar region.  Normal modality response following removal of modality.                                     10/25/23 EXERCISE LOG  Exercise Repetitions and Resistance Comments  Nustep  L3-4 x 17 minutes   Seated hip ADD isometric  3 minutes w/ 5 second hold   LAQ 3# x 2 minutes  Alternating LE   Seated marching  3# x 2.5 minutes  Alternating LE   Standing hip hike  25 reps each    Isometric ball press  3 minutes w/ 5 second    Rocker board  4 minutes    Blank cell = exercise not performed today   10/23/23:  Nustep level 4 x 16 minutes f/b patient in left SDLY position with folded pillow between knees for comfort:   STW/M with ischemic release technique utilized x 9 minutes to patient's right lumbar musculature f/b HMP and IFC at 80-150 Hx on 40% scan x 15 minutes to patient's right lumbar region.     Normal modality response following removal of modality.  PATIENT EDUCATION:  Education details:  Person educated:  International aid/development worker:  Education comprehension:   HOME EXERCISE PROGRAM:   ASSESSMENT:  CLINICAL  IMPRESSION: The patient is pleased with her progress.  She states soft tissue work has really been helpful.    OBJECTIVE IMPAIRMENTS: Abnormal gait, decreased activity tolerance, decreased ROM, increased muscle spasms, postural dysfunction, and pain.   ACTIVITY LIMITATIONS: carrying, lifting, bending, standing, and locomotion level  PARTICIPATION LIMITATIONS: meal prep, cleaning, laundry, community activity, and yard work  PERSONAL FACTORS: Time since onset of injury/illness/exacerbation and 1-2 comorbidities: OP, right TKA  are also affecting patient's functional outcome.   REHAB POTENTIAL: Good  CLINICAL DECISION MAKING: Evolving/moderate complexity  EVALUATION COMPLEXITY: Moderate   GOALS:  LONG TERM GOALS: Target date: 12/01/23  Ind with a HEP.  Goal status: INITIAL  2.  Walk a community distance with pain not > 4/10.  Goal status: INITIAL  3.  Perform ADL's with pain not > 4/10.  Goal status: INITIAL  PLAN:  PT FREQUENCY: 2x/week  PT DURATION: 6 weeks  PLANNED INTERVENTIONS: 97110-Therapeutic exercises, 97530- Therapeutic activity, [id number]- Neuromuscular re-education, 97535- Self Care, 40981- Manual therapy, G0283- Electrical stimulation (unattended), 97035- Ultrasound, Patient/Family education, Dry Needling, Cryotherapy, and Moist heat.  PLAN FOR NEXT SESSION: Combo e'stim/US at 1.50 W/CM2, STW/M, core exercise progression, spinal protection techniques and body mechanics training.   Jesiah Yerby, [place], PT 10/30/2023, 1:03 PM

## 2023-10-31 ENCOUNTER — Ambulatory Visit: Attending: Physician Assistant | Admitting: Physician Assistant

## 2023-10-31 ENCOUNTER — Other Ambulatory Visit: Payer: Self-pay | Admitting: *Deleted

## 2023-10-31 ENCOUNTER — Encounter: Payer: Self-pay | Admitting: Physician Assistant

## 2023-10-31 ENCOUNTER — Encounter (HOSPITAL_COMMUNITY): Payer: Self-pay

## 2023-10-31 VITALS — BP 160/80 | HR 68 | Ht 64.0 in | Wt 197.6 lb

## 2023-10-31 DIAGNOSIS — E7800 Pure hypercholesterolemia, unspecified: Secondary | ICD-10-CM | POA: Insufficient documentation

## 2023-10-31 DIAGNOSIS — E1159 Type 2 diabetes mellitus with other circulatory complications: Secondary | ICD-10-CM | POA: Insufficient documentation

## 2023-10-31 DIAGNOSIS — I5020 Unspecified systolic (congestive) heart failure: Secondary | ICD-10-CM | POA: Insufficient documentation

## 2023-10-31 DIAGNOSIS — R072 Precordial pain: Secondary | ICD-10-CM | POA: Insufficient documentation

## 2023-10-31 DIAGNOSIS — I2510 Atherosclerotic heart disease of native coronary artery without angina pectoris: Secondary | ICD-10-CM | POA: Diagnosis not present

## 2023-10-31 DIAGNOSIS — I493 Ventricular premature depolarization: Secondary | ICD-10-CM

## 2023-10-31 DIAGNOSIS — I152 Hypertension secondary to endocrine disorders: Secondary | ICD-10-CM | POA: Insufficient documentation

## 2023-10-31 MED ORDER — ENTRESTO 97-103 MG PO TABS: 1.0000 | ORAL_TABLET | Freq: Two times a day (BID) | ORAL | 3 refills | Status: DC

## 2023-10-31 MED ORDER — CARVEDILOL 6.25 MG PO TABS: 6.2500 mg | ORAL_TABLET | Freq: Two times a day (BID) | ORAL | 3 refills | Status: DC

## 2023-10-31 NOTE — Addendum Note (Signed)
 Addended by: Burnetta Sabin on: 10/31/2023 04:10 PM   Modules accepted: Orders

## 2023-10-31 NOTE — Assessment & Plan Note (Signed)
 Blood pressure uncontrolled. - Increase Entresto to 97/103 mg twice daily.   - Monitor blood pressure regularly. - BMET 2 weeks - Continue carvedilol 3.125 mg twice daily, spironolactone 12.5 mg daily

## 2023-10-31 NOTE — Assessment & Plan Note (Signed)
 LDL optimal on recent labs - Continue Crestor 20 mg daily.

## 2023-10-31 NOTE — Assessment & Plan Note (Signed)
 Mild non-obstructive coronary artery disease with mid LAD 10-20% stenosis noted in 2022. She notes recent intermittent left-sided chest pain, possibly cardiac versus gastrointestinal.  EKG today without acute changes. - Schedule a Lexiscan MPI

## 2023-10-31 NOTE — Assessment & Plan Note (Signed)
 Nonischemic cardiomyopathy.  NYHA II.  Volume status stable.  EF improved from 20-25% in 2022 to 45-50% by June 2023.   - Continue Farxiga 10 mg daily, carvedilol 3.25 mg twice daily, spironolactone 12.5 mg daily - Increase Entresto for better blood pressure control as noted

## 2023-10-31 NOTE — Patient Instructions (Signed)
 Medication Instructions:  Your physician has recommended you make the following change in your medication:   INCREASE the Entresto to 97/103 taking 1 twice a day.  You may use the 49/51 by taking 2 twice a day.  A new prescription has been sent to [facility] for the new dose  *If you need a refill on your cardiac medications before your next appointment, please call your pharmacy*  Lab Work: 2-3 WEEKS GO TO A [facility] NEAR YOU FOR:  BMET  [facility] locations:   [place] - [address] ([address] office) - [address] ([facility]) - [phone]   [place] [facility] [address]    [place]  - [address]    [place] - [address] - [address] [employer]  - [address] - [address] Chief Technology Officer)   If you have labs (blood work) drawn today and your tests are completely normal, you will receive your results only by: [employer] (if you have MyChart) OR A paper copy in the mail If you have any lab test that is abnormal or we need to change your treatment, we will call you to review the results.  Testing/Procedures: Your physician has requested that you have a lexiscan myoview. For further information please visit [email or web address] Please follow instruction sheet, BELOW:    You are scheduled for a Myocardial Perfusion Imaging Study Please arrive 15 minutes prior to your appointment time for registration and insurance purposes.  The test will take approximately 3 to 4 hours to complete; you may bring reading material.  If someone comes with you to your appointment, they will need to remain in the main lobby due to limited space in the testing area. **If you are pregnant or breastfeeding, please notify the nuclear lab prior to your appointment**  How to prepare for your Myocardial Perfusion  Test: Do not eat or drink 3 hours prior to your test, except you may have water. Do not consume products containing caffeine (regular or decaffeinated) 12 hours prior to your test. (ex: coffee, chocolate, sodas, tea). Do bring a list of your current medications with you.  If not listed below, you may take your medications as normal. Do not take carvedilol (Coreg) for 24 hours prior to the test.  Bring the medication to your appointment as you may be required to take it once the test is complete. Do wear comfortable clothes (no dresses or overalls) and walking shoes, tennis shoes preferred (No heels or open toe shoes are allowed). Do NOT wear cologne, perfume, aftershave, or lotions (deodorant is allowed). If these instructions are not followed, your test will have to be rescheduled.    Follow-Up: At [facility], you and your health needs are our priority.  As part of our continuing mission to provide you with exceptional heart care, our providers are all part of one team.  This team includes your primary Cardiologist (physician) and Advanced Practice Providers or APPs (Physician Assistants and Nurse Practitioners) who all work together to provide you with the care you need, when you need it.  Your next appointment:   6 week(s)  Provider:   Tereso Newcomer, PA-C         We recommend signing up for the patient portal called "MyChart".  Sign up information is provided on  this After Visit Summary.  MyChart is used to connect with patients for Virtual Visits (Telemedicine).  Patients are able to view lab/test results, encounter notes, upcoming appointments, etc.  Non-urgent messages can be sent to your provider as well.   To learn more about what you can do with MyChart, go to [email or web address].   Other Instructions       1st Floor: - Lobby - Registration  - Pharmacy  - Lab - Cafe  2nd Floor: - PV Lab - Diagnostic Testing (echo, CT, nuclear med)  3rd Floor: -  Vacant  4th Floor: - TCTS (cardiothoracic surgery) - AFib Clinic - Structural Heart Clinic - Vascular Surgery  - Vascular Ultrasound  5th Floor: - [facility] (general and EP) - Clinical Pharmacy for coumadin, hypertension, lipid, weight-loss medications, and med management appointments    Valet parking services will be available as well.

## 2023-11-01 ENCOUNTER — Other Ambulatory Visit: Payer: Self-pay | Admitting: Family Medicine

## 2023-11-01 ENCOUNTER — Ambulatory Visit: Attending: Family Medicine

## 2023-11-01 DIAGNOSIS — M62830 Muscle spasm of back: Secondary | ICD-10-CM | POA: Diagnosis present

## 2023-11-01 DIAGNOSIS — Z1231 Encounter for screening mammogram for malignant neoplasm of breast: Secondary | ICD-10-CM

## 2023-11-01 DIAGNOSIS — M5459 Other low back pain: Secondary | ICD-10-CM | POA: Diagnosis present

## 2023-11-01 DIAGNOSIS — R293 Abnormal posture: Secondary | ICD-10-CM

## 2023-11-01 NOTE — Therapy (Signed)
 OUTPATIENT PHYSICAL THERAPY THORACOLUMBAR TREATMENT  Patient Name: [name] MRN: [id number] DOB:[birth year], 78 y.o., female Today's Date: 11/01/2023  END OF SESSION:  PT End of Session - 11/01/23 0932     Visit Number 5    Number of Visits 12    Date for PT Re-Evaluation 12/01/23    PT Start Time 0930    PT Stop Time 1032    PT Time Calculation (min) 62 min    Activity Tolerance Patient tolerated treatment well    Behavior During Therapy [facility] for tasks assessed/performed                Past Medical History:  Diagnosis Date   Anxiety    Arthritis    CAD (coronary artery disease) 10/30/2023   LHC 09/07/20: mid LAD 10-20%, otherwise no significant CAD    Chronic back pain    buldging disc,scoliosis,arthritis   Chronic kidney disease    CKD3a   Claustrophobia    Congestive heart failure with left ventricular systolic dysfunction (HCC) 08/24/2020   Diabetes mellitus without complication (HCC)    takes Trulicity,Jardiance,and Metformin daily.Average fasting blood sugar runs around130   Dysrhythmia    PVCs, has MVP   Fibromyalgia    GERD (gastroesophageal reflux disease)    takes Omeprazole daily   Gout 07/19/2021   On allopurinol   History of bronchitis > 8 yrs ago   History of shingles    HTN (hypertension)    takes Amlodipine and Micardis daily   Hx of colonic polyps    benign   Internal and external hemorrhoids without complication    Joint pain    Mitral valve prolapse    Neuromuscular disorder (HCC)    restless leg syndrome   Nocturia    Pneumonia    PONV (postoperative nausea and vomiting)    when gets injections in joints gets hives.Betadine rash   Restless leg syndrome    takes Requip at bedtime   Seasonal allergies    takes Claritin daily as needed   Urinary frequency    Uterine fibroid    Past Surgical History:  Procedure Laterality Date   BUNIONECTOMY Bilateral    COLONOSCOPY  07/12/2004   diverticulosis, internal and external  hemorrhoids   ETHMOIDECTOMY Bilateral 08/15/2022   Procedure: ETHMOIDECTOMY WITH TISSUE REMOVAL;  Surgeon: Newman Pies, MD;  Location: [facility];  Service: ENT;  Laterality: Bilateral;   FINGER ARTHROSCOPY WITH CARPOMETACARPEL (CMC) ARTHROPLASTY Right 09/28/2015   Procedure: RIGHT THUMB TRAPEZIUM EXCISION WITH CARPOMETACARPEL (CMC) ARTHROPLASTY AND TENDON TRANSFER;  Surgeon: Bradly Bienenstock, MD;  Location: [facility];  Service: Orthopedics;  Laterality: Right;   FRONTAL SINUS EXPLORATION Bilateral 08/15/2022   Procedure: FRONTAL RECESS SINUS EXPLORATION WITH NAVIGATION;  Surgeon: Newman Pies, MD;  Location: [facility];  Service: ENT;  Laterality: Bilateral;   HEMORRHOID SURGERY     almost 40 yrs ago   HYSTEROSCOPY WITH D & C  08/23/2000   and resectoscopic myomectomy   KNEE CLOSED REDUCTION Left 06/05/2023   Procedure: CLOSED MANIPULATION KNEE;  Surgeon: Ollen Gross, MD;  Location: [facility];  Service: Orthopedics;  Laterality: Left;   LUMBAR DISC SURGERY  03/16/2005   LUMBAR EPIDURAL INJECTION     MASS EXCISION Right 08/08/2023   Procedure: EXCISION MASS RIGHT BUTTOCKS;  Surgeon: Lewie Chamber, DO;  Location: [facility];  Service: General;  Laterality: Right;   MAXILLARY ANTROSTOMY Bilateral 08/15/2022   Procedure: ENDOSCOPIC MAXILLARY ANTROSTOMY WITH TISSUE REMOVAL;  Surgeon: Newman Pies, MD;  Location: [facility];  Service: ENT;  Laterality: Bilateral;   PLANTAR FASCIA RELEASE Left 02/03/2010   and torn tendon   REVERSE SHOULDER ARTHROPLASTY Right 12/14/2020   Procedure: REVERSE SHOULDER ARTHROPLASTY;  Surgeon: Beverely Low, MD;  Location: [facility];  Service: Orthopedics;  Laterality: Right;   RIGHT/LEFT HEART CATH AND CORONARY ANGIOGRAPHY N/A 09/07/2020   Procedure: RIGHT/LEFT HEART CATH AND CORONARY ANGIOGRAPHY;  Surgeon: Yvonne Kendall, MD;  Location: [facility];  Service: Cardiovascular;  Laterality: N/A;   SINUS ENDO W/FUSION Bilateral  08/15/2022   Procedure: SPHENOIDECTOMY WITH TISSUE REMOVAL WITH NAVIGATION;  Surgeon: Newman Pies, MD;  Location: [facility];  Service: ENT;  Laterality: Bilateral;   TENDON TRANSFER Right 09/28/2015   Procedure: TENDON TRANSFER;  Surgeon: Bradly Bienenstock, MD;  Location: [facility];  Service: Orthopedics;  Laterality: Right;   TONSILLECTOMY     TOTAL KNEE ARTHROPLASTY Left 04/24/2023   Procedure: TOTAL KNEE ARTHROPLASTY;  Surgeon: Ollen Gross, MD;  Location: [facility];  Service: Orthopedics;  Laterality: Left;   TUBAL LIGATION     TURBINATE REDUCTION Bilateral 08/15/2022   Procedure: TURBINATE REDUCTION;  Surgeon: Newman Pies, MD;  Location: [facility];  Service: ENT;  Laterality: Bilateral;   WRIST SURGERY     left, removal of cyst   Patient Active Problem List   Diagnosis Date Noted   Pure hypercholesterolemia 10/30/2023   PVC's (premature ventricular contractions) 10/30/2023   CAD (coronary artery disease) 10/30/2023   Mass of buttock 08/08/2023   Vertigo 06/20/2023   Arthrofibrosis of total knee replacement (HCC) 06/05/2023   History of total knee arthroplasty 2024, Dr. 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   Laryngospasms 04/21/2022   Muscle tension dysphonia 04/21/2022   Old tear of meniscus of left knee 09/28/2021   Combined hyperlipidemia associated with type 2 diabetes mellitus (HCC) 09/28/2021   Gout 07/19/2021   S/P shoulder replacement, right 12/14/2020   Rotator cuff tear arthropathy 10/22/2020   Nonischemic cardiomyopathy (HCC) 10/06/2020   Heart failure with mildly reduced ejection fraction (HFmrEF, 41-49%) (HCC) 08/24/2020   Primary osteoarthritis of left shoulder 07/29/2020   DDD (degenerative disc disease), cervical 04/30/2020   Osteoarthritis of left knee 11/05/2018   Chronic pansinusitis 10/04/2017   Chronic low back pain 09/05/2017    Degenerative lumbar disc 06/04/2015   S/P lumbar laminectomy 06/04/2015   Restless leg syndrome 05/13/2014   Type 2 diabetes mellitus with peripheral neuropathy (HCC) 04/09/2013   Spinal stenosis of lumbar region without neurogenic claudication 09/12/2012   GERD (gastroesophageal reflux disease) 05/25/2011   Hemorrhoids, internal, with bleeding 05/25/2011   Hypertension associated with diabetes (HCC) 11/08/2010   Chronic allergic rhinitis 08/19/2010   Mitral valve prolapse 10/10/2008    REFERRING PROVIDER: Asencion Partridge  REFERRING DIAG: Spinal stenosis of lumbar region without neurogenic claudication.  Rationale for Evaluation and Treatment: Rehabilitation  THERAPY DIAG:  Other low back pain  Abnormal posture  Muscle spasm of back  ONSET DATE: Ongoing.  Per patient;  "Too long to remember."  SUBJECTIVE:  SUBJECTIVE STATEMENT: Patient reports that she is not hurting too bad today. Her pain is about a 2/10 when she uses her cane and 5/10 if she doesn't.   PERTINENT HISTORY:  OP.  Right TKA  PAIN:  Are you having pain? 2-5/10. Ache, throb.  PRECAUTIONS: Other: OP.  RED FLAGS: None   WEIGHT BEARING RESTRICTIONS: No  FALLS:  Has patient fallen in last 6 months? Yes. Number of falls Coming off a ladder and missed last step.  LIVING ENVIRONMENT: Lives with: lives with their family Lives in: House/apartment Stairs: 14 steps; on right going up and External: 1 steps; none  Has following equipment at home: Single point cane  OCCUPATION: Retired.  PLOF: Independent  PATIENT GOALS: Decrease back pain and to be able to walk more and do more around the house.    OBJECTIVE:  Note: Objective measures were completed at Evaluation unless otherwise noted.  PATIENT SURVEYS:  Modified  Oswestry 12/50   POSTURE: rounded shoulders, forward head, decreased lumbar lordosis, increased thoracic kyphosis, and flexed trunk , right knee flexion ~12 degrees, scoliosis, left-ward lateral lean/shift.    PALPATION: The patient with palpable tenderness and very taut right lumbar musculature.   LOWER EXTREMITY ROM:     In supine: Normal bilateral hip flexion.  Right knee in ~12 degrees of flexion.  LOWER EXTREMITY MMT:   Patient able to provide a 5/5 strength grade   LUMBAR SPECIAL TESTS:  (-) SLR testing.   GAIT: The patient walks in a flexed trunk posture using a straight cane.  TREATMENT DATE:                                    11/01/23 EXERCISE LOG  Exercise Repetitions and Resistance Comments  Nustep  L4 x 16 minutes    Rocker board  5 minutes   Seated hip ADD isometric  3 minutes w/ 5 second hold   Marching on foam  2 minutes  BUE support from parallel bars  Resisted rows Green t-band x 2 minutes With cueing for upright stance  Standing ball press  3 minutes w/ 5 second hold   LAQ  3# x 3 minutes  Alternating LE   Blank cell = exercise not performed today  Modalities: no redness or adverse reaction to today's modalities  Date:  Unattended Estim: R lumbar paraspinals and QL, IFC @ 80-150 Hz w/ 40% scan, 15 mins, Pain and Tone Hot Pack: Lumbar, 15 mins, Pain and Tone  10/30/23:  Nustep level 4 x 17 minutes f/b Patient in left SDLY position with folded pillow between knees for comfort:  STW/M x 8 minutes including ischemic release technique with focus on right QL f/b HMP and IFC at 80-150 Hx on 40% scan x 20 minutes to patient's right lumbar region.  Normal modality response following removal of modality.                                   10/25/23 EXERCISE LOG  Exercise Repetitions and Resistance Comments  Nustep  L3-4 x 17 minutes   Seated hip ADD isometric  3 minutes w/ 5 second hold   LAQ 3# x 2 minutes  Alternating LE   Seated marching  3# x 2.5 minutes   Alternating LE   Standing hip hike  25 reps each    Isometric  ball press  3 minutes w/ 5 second    Rocker board  4 minutes    Blank cell = exercise not performed today   PATIENT EDUCATION:  Education details:  Person educated:  International aid/development worker:  Education comprehension:   HOME EXERCISE PROGRAM:   ASSESSMENT:  CLINICAL IMPRESSION: Patient was progressed with multiple new standing interventions for improved postural reeducation. She required minimal cueing with marching on foam and other standing interventions for upright posture to facilitate improved awareness or her surroundings. She experienced no significant increase in pain or discomfort with any of today's interventions. She reported feeling good upon the conclusion of treatment. She continues to require skilled physical therapy to address her remaining impairments to maximize her functional mobility.   OBJECTIVE IMPAIRMENTS: Abnormal gait, decreased activity tolerance, decreased ROM, increased muscle spasms, postural dysfunction, and pain.   ACTIVITY LIMITATIONS: carrying, lifting, bending, standing, and locomotion level  PARTICIPATION LIMITATIONS: meal prep, cleaning, laundry, community activity, and yard work  PERSONAL FACTORS: Time since onset of injury/illness/exacerbation and 1-2 comorbidities: OP, right TKA  are also affecting patient's functional outcome.   REHAB POTENTIAL: Good  CLINICAL DECISION MAKING: Evolving/moderate complexity  EVALUATION COMPLEXITY: Moderate   GOALS:  LONG TERM GOALS: Target date: 12/01/23  Ind with a HEP.  Goal status: INITIAL  2.  Walk a community distance with pain not > 4/10.  Goal status: INITIAL  3.  Perform ADL's with pain not > 4/10.  Goal status: INITIAL  PLAN:  PT FREQUENCY: 2x/week  PT DURATION: 6 weeks  PLANNED INTERVENTIONS: 97110-Therapeutic exercises, 97530- Therapeutic activity, [id number]- Neuromuscular re-education, 97535- Self Care, [id number]- Manual therapy, [id number]-  Electrical stimulation (unattended), 97035- Ultrasound, Patient/Family education, Dry Needling, Cryotherapy, and Moist heat.  PLAN FOR NEXT SESSION: Combo e'stim/US at 1.50 W/CM2, STW/M, core exercise progression, spinal protection techniques and body mechanics training.   Granville Lewis, PT 11/01/2023, 10:54 AM

## 2023-11-06 ENCOUNTER — Ambulatory Visit

## 2023-11-06 ENCOUNTER — Telehealth (INDEPENDENT_AMBULATORY_CARE_PROVIDER_SITE_OTHER): Admitting: Family Medicine

## 2023-11-06 VITALS — Ht 64.0 in | Wt 197.0 lb

## 2023-11-06 DIAGNOSIS — M5459 Other low back pain: Secondary | ICD-10-CM | POA: Diagnosis not present

## 2023-11-06 DIAGNOSIS — J309 Allergic rhinitis, unspecified: Secondary | ICD-10-CM | POA: Diagnosis not present

## 2023-11-06 DIAGNOSIS — M62830 Muscle spasm of back: Secondary | ICD-10-CM

## 2023-11-06 DIAGNOSIS — R293 Abnormal posture: Secondary | ICD-10-CM

## 2023-11-06 MED ORDER — FLUTICASONE PROPIONATE 50 MCG/ACT NA SUSP: 1.0000 | Freq: Every day | NASAL | 11 refills | Status: DC

## 2023-11-06 NOTE — Patient Instructions (Signed)
 Please follow up as scheduled for your next visit with me: 01/08/2024   If you have any questions or concerns, please don't hesitate to send me a message via MyChart or call the office at [phone]. Thank you for visiting with [place] today! It's our pleasure caring for you.   VISIT SUMMARY:  You were evaluated  today because of sinus drainage and nasal congestion that started after working outside in the yard. You noticed a greenish-yellow discharge and a large spongy mass from your nose, but you do not have sinus pain, fever, or other signs of infection. You have been using over-the-counter Flonase and Zyrtec, which have helped somewhat.  YOUR PLAN:  -ALLERGIC RHINITIS: Allergic rhinitis is an inflammation of the inside of your nose caused by allergens like pollen. Your symptoms are likely due to high pollen levels. Continue using Flonase and Zyrtec as they have been providing relief. Start using nasal saline rinses before and after outdoor activities to help clear out allergens. Watch for any signs of infection such as sinus pain or fever and let us know if they occur. A prescription for Flonase has been sent to your pharmacy.  INSTRUCTIONS:  Please continue using Flonase and Zyrtec as directed. Start nasal saline rinses before and after outdoor activities. Monitor for any signs of infection like sinus pain or fever and report them if they occur. Your prescription for Flonase has been sent to your pharmacy.

## 2023-11-06 NOTE — Therapy (Signed)
 OUTPATIENT PHYSICAL THERAPY THORACOLUMBAR TREATMENT  Patient Name: [name] MRN: [id number] DOB:1945-12-27, 78 y.o., female Today's Date: 11/06/2023  END OF SESSION:  PT End of Session - 11/06/23 0852     Visit Number 6    Number of Visits 12    Date for PT Re-Evaluation 12/01/23    PT Start Time 0845    PT Stop Time 0947    PT Time Calculation (min) 62 min    Activity Tolerance Patient tolerated treatment well    Behavior During Therapy [facility] for tasks assessed/performed                Past Medical History:  Diagnosis Date   Anxiety    Arthritis    CAD (coronary artery disease) 10/30/2023   LHC 09/07/20: mid LAD 10-20%, otherwise no significant CAD    Chronic back pain    buldging disc,scoliosis,arthritis   Chronic kidney disease    CKD3a   Claustrophobia    Congestive heart failure with left ventricular systolic dysfunction (HCC) 08/24/2020   Diabetes mellitus without complication (HCC)    takes Trulicity,Jardiance,and Metformin daily.Average fasting blood sugar runs around130   Dysrhythmia    PVCs, has MVP   Fibromyalgia    GERD (gastroesophageal reflux disease)    takes Omeprazole daily   Gout 07/19/2021   On allopurinol   History of bronchitis > 8 yrs ago   History of shingles    HTN (hypertension)    takes Amlodipine and Micardis daily   Hx of colonic polyps    benign   Internal and external hemorrhoids without complication    Joint pain    Mitral valve prolapse    Neuromuscular disorder (HCC)    restless leg syndrome   Nocturia    Pneumonia    PONV (postoperative nausea and vomiting)    when gets injections in joints gets hives.Betadine rash   Restless leg syndrome    takes Requip at bedtime   Seasonal allergies    takes Claritin daily as needed   Urinary frequency    Uterine fibroid    Past Surgical History:  Procedure Laterality Date   BUNIONECTOMY Bilateral    COLONOSCOPY  07/12/2004   diverticulosis, internal and external  hemorrhoids   ETHMOIDECTOMY Bilateral 08/15/2022   Procedure: ETHMOIDECTOMY WITH TISSUE REMOVAL;  Surgeon: Newman Pies, MD;  Location: [facility];  Service: ENT;  Laterality: Bilateral;   FINGER ARTHROSCOPY WITH CARPOMETACARPEL (CMC) ARTHROPLASTY Right 09/28/2015   Procedure: RIGHT THUMB TRAPEZIUM EXCISION WITH CARPOMETACARPEL (CMC) ARTHROPLASTY AND TENDON TRANSFER;  Surgeon: Bradly Bienenstock, MD;  Location: [facility];  Service: Orthopedics;  Laterality: Right;   FRONTAL SINUS EXPLORATION Bilateral 08/15/2022   Procedure: FRONTAL RECESS SINUS EXPLORATION WITH NAVIGATION;  Surgeon: Newman Pies, MD;  Location: [facility];  Service: ENT;  Laterality: Bilateral;   HEMORRHOID SURGERY     almost 40 yrs ago   HYSTEROSCOPY WITH D & C  08/23/2000   and resectoscopic myomectomy   KNEE CLOSED REDUCTION Left 06/05/2023   Procedure: CLOSED MANIPULATION KNEE;  Surgeon: Ollen Gross, MD;  Location: [facility];  Service: Orthopedics;  Laterality: Left;   LUMBAR DISC SURGERY  03/16/2005   LUMBAR EPIDURAL INJECTION     MASS EXCISION Right 08/08/2023   Procedure: EXCISION MASS RIGHT BUTTOCKS;  Surgeon: Lewie Chamber, DO;  Location: [facility];  Service: General;  Laterality: Right;   MAXILLARY ANTROSTOMY Bilateral 08/15/2022   Procedure: ENDOSCOPIC MAXILLARY ANTROSTOMY WITH TISSUE REMOVAL;  Surgeon: Newman Pies, MD;  Location: [facility];  Service: ENT;  Laterality: Bilateral;   PLANTAR FASCIA RELEASE Left 02/03/2010   and torn tendon   REVERSE SHOULDER ARTHROPLASTY Right 12/14/2020   Procedure: REVERSE SHOULDER ARTHROPLASTY;  Surgeon: Beverely Low, MD;  Location: [facility];  Service: Orthopedics;  Laterality: Right;   RIGHT/LEFT HEART CATH AND CORONARY ANGIOGRAPHY N/A 09/07/2020   Procedure: RIGHT/LEFT HEART CATH AND CORONARY ANGIOGRAPHY;  Surgeon: Yvonne Kendall, MD;  [facility]: [facility];  Service: Cardiovascular;  Laterality: N/A;   SINUS ENDO W/FUSION Bilateral  08/15/2022   Procedure: SPHENOIDECTOMY WITH TISSUE REMOVAL WITH NAVIGATION;  Surgeon: Newman Pies, MD;  Location: [facility];  Service: ENT;  Laterality: Bilateral;   TENDON TRANSFER Right 09/28/2015   Procedure: TENDON TRANSFER;  Surgeon: Bradly Bienenstock, MD;  Location: [facility];  Service: Orthopedics;  Laterality: Right;   TONSILLECTOMY     TOTAL KNEE ARTHROPLASTY Left 04/24/2023   Procedure: TOTAL KNEE ARTHROPLASTY;  Surgeon: Ollen Gross, MD;  Location: [facility];  Service: Orthopedics;  Laterality: Left;   TUBAL LIGATION     TURBINATE REDUCTION Bilateral 08/15/2022   Procedure: TURBINATE REDUCTION;  Surgeon: Newman Pies, MD;  Location: [facility];  Service: ENT;  Laterality: Bilateral;   WRIST SURGERY     left, removal of cyst   Patient Active Problem List   Diagnosis Date Noted   Pure hypercholesterolemia 10/30/2023   PVC's (premature ventricular contractions) 10/30/2023   CAD (coronary artery disease) 10/30/2023   Mass of buttock 08/08/2023   Vertigo 06/20/2023   Arthrofibrosis of total knee replacement (HCC) 06/05/2023   History of total knee arthroplasty 2024, Dr. 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   Laryngospasms 04/21/2022   Muscle tension dysphonia 04/21/2022   Old tear of meniscus of left knee 09/28/2021   Combined hyperlipidemia associated with type 2 diabetes mellitus (HCC) 09/28/2021   Gout 07/19/2021   S/P shoulder replacement, right 12/14/2020   Rotator cuff tear arthropathy 10/22/2020   Nonischemic cardiomyopathy (HCC) 10/06/2020   Heart failure with mildly reduced ejection fraction (HFmrEF, 41-49%) (HCC) 08/24/2020   Primary osteoarthritis of left shoulder 07/29/2020   DDD (degenerative disc disease), cervical 04/30/2020   Osteoarthritis of left knee 11/05/2018   Chronic pansinusitis 10/04/2017   Chronic low back pain 09/05/2017    Degenerative lumbar disc 06/04/2015   S/P lumbar laminectomy 06/04/2015   Restless leg syndrome 05/13/2014   Type 2 diabetes mellitus with peripheral neuropathy (HCC) 04/09/2013   Spinal stenosis of lumbar region without neurogenic claudication 09/12/2012   GERD (gastroesophageal reflux disease) 05/25/2011   Hemorrhoids, internal, with bleeding 05/25/2011   Hypertension associated with diabetes (HCC) 11/08/2010   Chronic allergic rhinitis 08/19/2010   Mitral valve prolapse 10/10/2008    REFERRING PROVIDER: Asencion Partridge  REFERRING DIAG: Spinal stenosis of lumbar region without neurogenic claudication.  Rationale for Evaluation and Treatment: Rehabilitation  THERAPY DIAG:  Other low back pain  Abnormal posture  Muscle spasm of back  ONSET DATE: Ongoing.  Per patient;  "Too long to remember."  SUBJECTIVE:  SUBJECTIVE STATEMENT: Pt reports 4/10 low back pain today.  Pt states that her pain has been increased this weekend.   PERTINENT HISTORY:  OP.  Right TKA  PAIN:  Are you having pain? 4/10. Ache, throb.  PRECAUTIONS: Other: OP.  RED FLAGS: None   WEIGHT BEARING RESTRICTIONS: No  FALLS:  Has patient fallen in last 6 months? Yes. Number of falls Coming off a ladder and missed last step.  LIVING ENVIRONMENT: Lives with: lives with their family Lives in: House/apartment Stairs: 14 steps; on right going up and External: 1 steps; none  Has following equipment at home: Single point cane  OCCUPATION: Retired.  PLOF: Independent  PATIENT GOALS: Decrease back pain and to be able to walk more and do more around the house.    OBJECTIVE:  Note: Objective measures were completed at Evaluation unless otherwise noted.  PATIENT SURVEYS:  Modified Oswestry 12/50   POSTURE: rounded  shoulders, forward head, decreased lumbar lordosis, increased thoracic kyphosis, and flexed trunk , right knee flexion ~12 degrees, scoliosis, left-ward lateral lean/shift.    PALPATION: The patient with palpable tenderness and very taut right lumbar musculature.   LOWER EXTREMITY ROM:     In supine: Normal bilateral hip flexion.  Right knee in ~12 degrees of flexion.  LOWER EXTREMITY MMT:   Patient able to provide a 5/5 strength grade   LUMBAR SPECIAL TESTS:  (-) SLR testing.   GAIT: The patient walks in a flexed trunk posture using a straight cane.  TREATMENT DATE:                                    11/06/23 EXERCISE LOG  Exercise Repetitions and Resistance Comments  Nustep  L4 x 16 minutes    Rocker board  5 minutes   Seated hip ADD isometric  3 minutes w/ 5 second hold   Marching on foam   BUE support from parallel bars  Tandem Gait  Airex x 3 mins   Side stepping Airex x 3 mins   Resisted rows Green t-band x 2 minutes With cueing for upright stance  Standing ball press     LAQ  4# x 2 minutes  Alternating LE   Blank cell = exercise not performed today  Modalities: no redness or adverse reaction to today's modalities  Date:  Unattended Estim: R lumbar paraspinals and QL, IFC @ 80-150 Hz w/ 40% scan, 15 mins, Pain and Tone Hot Pack: Lumbar, 15 mins, Pain and Tone  10/30/23:  Nustep level 4 x 17 minutes f/b Patient in left SDLY position with folded pillow between knees for comfort:  STW/M x 8 minutes including ischemic release technique with focus on right QL f/b HMP and IFC at 80-150 Hx on 40% scan x 20 minutes to patient's right lumbar region.  Normal modality response following removal of modality.                                   10/25/23 EXERCISE LOG  Exercise Repetitions and Resistance Comments  Nustep  L3-4 x 17 minutes   Seated hip ADD isometric  3 minutes w/ 5 second hold   LAQ 3# x 2 minutes  Alternating LE   Seated marching  3# x 2.5 minutes  Alternating LE    Standing hip hike  25 reps each  Isometric ball press  3 minutes w/ 5 second    Rocker board  4 minutes    Blank cell = exercise not performed today   PATIENT EDUCATION:  Education details:  Person educated:  International aid/development worker:  Education comprehension:   HOME EXERCISE PROGRAM:   ASSESSMENT:  CLINICAL IMPRESSION: Pt arrives for today's treatment session reporting 4/10 low back pain.  Pt reports that pain level has been increased over the weekend.  Pt introduced to balance activities on the Airex balance beam today with pt requiring BUE support for safety and stability.  Normal responses to estim and MH noted upon removal.  Pt reported 2/10 low back pain at completion of today's treatment session.    OBJECTIVE IMPAIRMENTS: Abnormal gait, decreased activity tolerance, decreased ROM, increased muscle spasms, postural dysfunction, and pain.   ACTIVITY LIMITATIONS: carrying, lifting, bending, standing, and locomotion level  PARTICIPATION LIMITATIONS: meal prep, cleaning, laundry, community activity, and yard work  PERSONAL FACTORS: Time since onset of injury/illness/exacerbation and 1-2 comorbidities: OP, right TKA  are also affecting patient's functional outcome.   REHAB POTENTIAL: Good  CLINICAL DECISION MAKING: Evolving/moderate complexity  EVALUATION COMPLEXITY: Moderate   GOALS:  LONG TERM GOALS: Target date: 12/01/23  Ind with a HEP.  Goal status: IN PROGRESS  2.  Walk a community distance with pain not > 4/10.  Goal status: IN PROGRESS  3.  Perform ADL's with pain not > 4/10.  Goal status: IN PROGRESS  PLAN:  PT FREQUENCY: 2x/week  PT DURATION: 6 weeks  PLANNED INTERVENTIONS: 97110-Therapeutic exercises, 97530- Therapeutic activity, [id number]- Neuromuscular re-education, 97535- Self Care, 24401- Manual therapy, G0283- Electrical stimulation (unattended), 97035- Ultrasound, Patient/Family education, Dry Needling, Cryotherapy, and Moist heat.  PLAN FOR NEXT  SESSION: Combo e'stim/US at 1.50 W/CM2, STW/M, core exercise progression, spinal protection techniques and body mechanics training.   Newman Pies, PTA 11/06/2023, 9:50 AM

## 2023-11-06 NOTE — Progress Notes (Signed)
 Virtual Visit via Video Note  Subjective  CC:  Chief Complaint  Patient presents with   Sinus Problem    X1 week and has gotten a little better     I connected with KAYLEA MOUNSEY on 11/06/23 at 11:30 AM EDT by a video enabled telemedicine application and verified that I am speaking with the correct person using two identifiers. Location patient: Home Location provider: [facility] [address] Office Persons participating in the virtual visit: ANALYN MATUSEK, Willow Ora, MD Trudie Reed CMA  I discussed the limitations of evaluation and management by telemedicine and the availability of in person appointments. The patient expressed understanding and agreed to proceed. HPI: [name] is a 78 y.o. female who was contacted today to address the problems listed above in the chief complaint. Discussed the use of AI scribe software for clinical note transcription with the patient, who gave verbal consent to proceed.  History of Present Illness   The patient presents with sinus drainage and nasal congestion.  She has been experiencing significant sinus drainage and nasal congestion, characterized by expelling a 'greenish yellow mess.' These symptoms began after working outside in the yard last Thursday, and by Friday morning, the left side of her nose was 'stopped up.' She expelled a large, spongy mass, about the size of a cotton ball, which she attributes to pollen exposure during a period of high pollen levels. No sinus pain, fever, or other symptoms indicative of an infection.  She has been using over-the-counter Flonase and Zyrtec, which have provided some relief. She recently ran out of her prescription Flonase and purchased an over-the-counter version to continue treatment.  She has a history of recent antibiotic use for an abscess, though she did not specify further details.      Assessment  1. Chronic allergic rhinitis      Plan  Assessment and  Plan    Allergic Rhinitis   Sinus drainage and nasal congestion consistent with allergic rhinitis, likely exacerbated by pollen. Greenish-yellow nasal discharge without infection signs. Relief noted with Flonase and Zyrtec. Allergies suspected due to lack of systemic symptoms and recent antibiotic use.   - Continue Flonase and Zyrtec.   - Initiate nasal saline rinses before and after outdoor activities.   - Monitor for infection signs such as sinus pain or fever and report if she occurs.   - Send prescription for Flonase to pharmacy.       I discussed the assessment and treatment plan with the patient. The patient was provided an opportunity to ask questions and all were answered. The patient agreed with the plan and demonstrated an understanding of the instructions.   The patient was advised to call back or seek an in-person evaluation if the symptoms worsen or if the condition fails to improve as anticipated. Follow up: as scheduled 01/08/2024  Meds ordered this encounter  Medications   fluticasone (FLONASE) 50 MCG/ACT nasal spray    Sig: Place 1 spray into both nostrils daily.    Dispense:  16 g    Refill:  11      I reviewed the patients updated PMH, FH, and SocHx.    Patient Active Problem List   Diagnosis Date Noted   Stage 3a chronic kidney disease (HCC) 10/04/2022    Priority: High   Combined hyperlipidemia associated with type 2 diabetes mellitus (HCC) 09/28/2021    Priority: High   Nonischemic cardiomyopathy (HCC) 10/06/2020  Priority: High   Heart failure with mildly reduced ejection fraction (HFmrEF, 41-49%) (HCC) 08/24/2020    Priority: High   Type 2 diabetes mellitus with peripheral neuropathy (HCC) 04/09/2013    Priority: High   Hypertension associated with diabetes (HCC) 11/08/2010    Priority: High   History of total knee arthroplasty 2024, Dr. Despina Hick 05/26/2023    Priority: Medium    Vocal fold paresis, left 04/21/2022    Priority: Medium     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    Priority: Medium    Gout 07/19/2021    Priority: Medium    Rotator cuff tear arthropathy 10/22/2020    Priority: Medium    Primary osteoarthritis of left shoulder 07/29/2020    Priority: Medium    DDD (degenerative disc disease), cervical 04/30/2020    Priority: Medium    Osteoarthritis of left knee 11/05/2018    Priority: Medium    Chronic low back pain 09/05/2017    Priority: Medium    Degenerative lumbar disc 06/04/2015    Priority: Medium    S/P lumbar laminectomy 06/04/2015    Priority: Medium    Restless leg syndrome 05/13/2014    Priority: Medium    Spinal stenosis of lumbar region without neurogenic claudication 09/12/2012    Priority: Medium    GERD (gastroesophageal reflux disease) 05/25/2011    Priority: Medium    Mitral valve prolapse 10/10/2008    Priority: Medium    Screening for osteoporosis 10/04/2022    Priority: Low   Old tear of meniscus of left knee 09/28/2021    Priority: Low   S/P shoulder replacement, right 12/14/2020    Priority: Low   Chronic pansinusitis 10/04/2017    Priority: Low   Hemorrhoids, internal, with bleeding 05/25/2011    Priority: Low   Chronic allergic rhinitis 08/19/2010    Priority: Low   Pure hypercholesterolemia 10/30/2023   PVC's (premature ventricular contractions) 10/30/2023   CAD (coronary artery disease) 10/30/2023   Mass of buttock 08/08/2023   Vertigo 06/20/2023   Arthrofibrosis of total knee replacement (HCC) 06/05/2023   Current Meds  Medication Sig   allopurinol (ZYLOPRIM) 100 MG tablet TAKE 1 TABLET BY MOUTH EVERY DAY   Calcium-Magnesium-Zinc (CAL-MAG-ZINC PO) Take 1 tablet by mouth in the morning.   Carboxymeth-Glyc-Polysorb PF (REFRESH OPTIVE MEGA-3) 0.5-1-0.5 % SOLN Place 1-2 drops into both eyes 3 (three) times daily as needed (dry/irritated eyes.).   carvedilol (COREG) 3.125 MG tablet Take 3.125 mg by mouth 2 (two) times  daily with a meal.   cholecalciferol (VITAMIN D) 1000 UNITS tablet Take 1,000 Units by mouth in the morning.   cyanocobalamin (VITAMIN B12) 1000 MCG tablet Take 1,000 mcg by mouth in the morning.   dapagliflozin propanediol (FARXIGA) 10 MG TABS tablet Take 1 tablet (10 mg total) by mouth daily before breakfast.   famotidine (PEPCID) 20 MG tablet Take 20 mg by mouth at bedtime.   fluticasone (FLONASE) 50 MCG/ACT nasal spray Place 1 spray into both nostrils daily.   furosemide (LASIX) 20 MG tablet Take 1 tablet (20 mg total) by mouth as directed. Monday,wed, Friday and sat only.   ibuprofen (ADVIL) 200 MG tablet Take 600 mg by mouth daily as needed (pain.).   insulin glargine (LANTUS SOLOSTAR) 100 UNIT/ML Solostar Pen Inject 25 Units into the skin daily.   Insulin Pen Needle (ASSURE ID SAFETY PEN NEEDLES) 30G X 5 MM MISC USE AS DIRECTED  metFORMIN (GLUCOPHAGE) 500 MG tablet TAKE 1 TABLET BY MOUTH 2 TIMES DAILY WITH A MEAL.   montelukast (SINGULAIR) 10 MG tablet TAKE 1 TABLET BY MOUTH EVERY DAY   Multiple Vitamin (MULTIVITAMIN WITH MINERALS) TABS tablet Take 1 tablet by mouth in the morning.   omeprazole (PRILOSEC) 40 MG capsule TAKE 1 CAPSULE (40 MG TOTAL) BY MOUTH DAILY.   pregabalin (LYRICA) 100 MG capsule Take 1 capsule by mouth twice daily   rOPINIRole (REQUIP) 3 MG tablet TAKE 1 TABLET BY MOUTH EVERYDAY AT BEDTIME   rosuvastatin (CRESTOR) 20 MG tablet Take 1 tablet (20 mg total) by mouth at bedtime.   sacubitril-valsartan (ENTRESTO) 97-103 MG Take 1 tablet by mouth 2 (two) times daily.   spironolactone (ALDACTONE) 25 MG tablet Take 0.5 tablets (12.5 mg total) by mouth daily.    Allergies: Patient is allergic to amlodipine, amoxicillin-pot clavulanate, depo-medrol [methylprednisolone], gabapentin, levothyroxine, sitagliptin, amoxicillin, codeine, corticosteroids, tetracyclines & related, ace inhibitors, and dexamethasone. Family History: Patient family history includes Pancreatic cancer in  her father. Social History:  Patient  reports that she has never smoked. She has never used smokeless tobacco. She reports that she does not drink alcohol and does not use drugs.  Review of Systems: Constitutional: Negative for fever malaise or anorexia Cardiovascular: negative for chest pain Respiratory: negative for SOB or persistent cough Gastrointestinal: negative for abdominal pain  OBJECTIVE Vitals: Ht 5\' 4"  (1.626 m)   Wt 197 lb (89.4 kg)   BMI 33.81 kg/m  General: no acute distress , A&Ox3, appears well  Willow Ora, MD

## 2023-11-07 ENCOUNTER — Ambulatory Visit (INDEPENDENT_AMBULATORY_CARE_PROVIDER_SITE_OTHER): Admitting: Surgery

## 2023-11-07 ENCOUNTER — Encounter: Payer: Self-pay | Admitting: Surgery

## 2023-11-07 VITALS — BP 150/79 | HR 63 | Temp 97.5°F | Resp 14 | Ht 64.0 in | Wt 199.0 lb

## 2023-11-07 DIAGNOSIS — L7634 Postprocedural seroma of skin and subcutaneous tissue following other procedure: Secondary | ICD-10-CM | POA: Diagnosis not present

## 2023-11-07 NOTE — Progress Notes (Unsigned)
 [facility] Note   HPI:  78 y.o. Female presents to clinic for follow-up of her right buttock wound status post excision of right buttock mass on 1/7.  Patient states that for just over a week, she has had no further drainage from the area.  She believes that the location is closed.  She denies any fevers, chills, or pain associated with the area.  Review of Systems:  All other review of systems: otherwise negative   Vital Signs:  BP (!) 150/79   Pulse 63   Temp (!) 97.5 F (36.4 C) (Oral)   Resp 14   Ht 5\' 4"  (1.626 m)   Wt 199 lb (90.3 kg)   SpO2 95%   BMI 34.16 kg/m    Physical Exam:  Physical Exam Vitals reviewed.  Constitutional:      Appearance: Normal appearance.  Skin:    General: Skin is warm and dry.     Comments: Right buttock incision site completely healed with no open wounds  Neurological:     Mental Status: She is alert.     Laboratory studies: None  Imaging:  None  Assessment:  78 y.o. yo Female who presents for follow-up status post excision of a right buttock mass on 1/7 with subsequent development of a postoperative seroma and wound  Plan:  -Area has completely healed closed and is no longer causing any drainage or pain for the patient -No further interventions needed regarding incision site -Follow up with me as needed  All of the above recommendations were discussed with the patient, and all of patient's questions were answered to her expressed satisfaction.  Note: Portions of this report may have been transcribed using voice recognition software. Every effort has been made to ensure accuracy; however, inadvertent computerized transcription errors may still be present.   Theophilus Kinds, DO [facility] [address] [place], [place] [address] [phone] (office)

## 2023-11-08 ENCOUNTER — Ambulatory Visit: Admitting: *Deleted

## 2023-11-08 ENCOUNTER — Encounter: Payer: Self-pay | Admitting: *Deleted

## 2023-11-08 DIAGNOSIS — M5459 Other low back pain: Secondary | ICD-10-CM | POA: Diagnosis not present

## 2023-11-08 DIAGNOSIS — R293 Abnormal posture: Secondary | ICD-10-CM

## 2023-11-08 DIAGNOSIS — M62830 Muscle spasm of back: Secondary | ICD-10-CM

## 2023-11-08 NOTE — Therapy (Addendum)
 OUTPATIENT PHYSICAL THERAPY THORACOLUMBAR TREATMENT  Patient Name: [name] MRN: [id number] DOB:03/30/1946, 78 y.o., female Today's Date: 11/08/2023  END OF SESSION:  PT End of Session - 11/08/23 0939     Visit Number 7    Number of Visits 12    Date for PT Re-Evaluation 12/01/23    PT Start Time 0930    PT Stop Time 1030    PT Time Calculation (min) 60 min                Past Medical History:  Diagnosis Date   Anxiety    Arthritis    CAD (coronary artery disease) 10/30/2023   LHC 09/07/20: mid LAD 10-20%, otherwise no significant CAD    Chronic back pain    buldging disc,scoliosis,arthritis   Chronic kidney disease    CKD3a   Claustrophobia    Congestive heart failure with left ventricular systolic dysfunction (HCC) 08/24/2020   Diabetes mellitus without complication (HCC)    takes Trulicity,Jardiance,and Metformin daily.Average fasting blood sugar runs around130   Dysrhythmia    PVCs, has MVP   Fibromyalgia    GERD (gastroesophageal reflux disease)    takes Omeprazole daily   Gout 07/19/2021   On allopurinol   History of bronchitis > 8 yrs ago   History of shingles    HTN (hypertension)    takes Amlodipine and Micardis daily   Hx of colonic polyps    benign   Internal and external hemorrhoids without complication    Joint pain    Mitral valve prolapse    Neuromuscular disorder (HCC)    restless leg syndrome   Nocturia    Pneumonia    PONV (postoperative nausea and vomiting)    when gets injections in joints gets hives.Betadine rash   Restless leg syndrome    takes Requip at bedtime   Seasonal allergies    takes Claritin daily as needed   Urinary frequency    Uterine fibroid    Past Surgical History:  Procedure Laterality Date   BUNIONECTOMY Bilateral    COLONOSCOPY  07/12/2004   diverticulosis, internal and external hemorrhoids   ETHMOIDECTOMY Bilateral 08/15/2022   Procedure: ETHMOIDECTOMY WITH TISSUE REMOVAL;  Surgeon: Newman Pies, MD;   Location: [facility];  Service: ENT;  Laterality: Bilateral;   FINGER ARTHROSCOPY WITH CARPOMETACARPEL (CMC) ARTHROPLASTY Right 09/28/2015   Procedure: RIGHT THUMB TRAPEZIUM EXCISION WITH CARPOMETACARPEL (CMC) ARTHROPLASTY AND TENDON TRANSFER;  Surgeon: Bradly Bienenstock, MD;  Location: [facility];  Service: Orthopedics;  Laterality: Right;   FRONTAL SINUS EXPLORATION Bilateral 08/15/2022   Procedure: FRONTAL RECESS SINUS EXPLORATION WITH NAVIGATION;  Surgeon: Newman Pies, MD;  Location: [facility];  Service: ENT;  Laterality: Bilateral;   HEMORRHOID SURGERY     almost 40 yrs ago   HYSTEROSCOPY WITH D & C  08/23/2000   and resectoscopic myomectomy   KNEE CLOSED REDUCTION Left 06/05/2023   Procedure: CLOSED MANIPULATION KNEE;  Surgeon: Ollen Gross, MD;  Location: [facility];  Service: Orthopedics;  Laterality: Left;   LUMBAR DISC SURGERY  03/16/2005   LUMBAR EPIDURAL INJECTION     MASS EXCISION Right 08/08/2023   Procedure: EXCISION MASS RIGHT BUTTOCKS;  Surgeon: Lewie Chamber, DO;  Location: [facility];  Service: General;  Laterality: Right;   MAXILLARY ANTROSTOMY Bilateral 08/15/2022   Procedure: ENDOSCOPIC MAXILLARY ANTROSTOMY WITH TISSUE REMOVAL;  Surgeon: Newman Pies, MD;  Location:  SURGERY CENTER;  Service: ENT;  Laterality: Bilateral;   PLANTAR  FASCIA RELEASE Left 02/03/2010   and torn tendon   REVERSE SHOULDER ARTHROPLASTY Right 12/14/2020   Procedure: REVERSE SHOULDER ARTHROPLASTY;  Surgeon: Beverely Low, MD;  Location: [facility];  Service: Orthopedics;  Laterality: Right;   RIGHT/LEFT HEART CATH AND CORONARY ANGIOGRAPHY N/A 09/07/2020   Procedure: RIGHT/LEFT HEART CATH AND CORONARY ANGIOGRAPHY;  Surgeon: Yvonne Kendall, MD;  [facility]: [facility];  Service: Cardiovascular;  Laterality: N/A;   SINUS ENDO W/FUSION Bilateral 08/15/2022   Procedure: SPHENOIDECTOMY WITH TISSUE REMOVAL WITH NAVIGATION;  Surgeon: Newman Pies, MD;  Location: [facility];  Service: ENT;  Laterality: Bilateral;   TENDON TRANSFER Right 09/28/2015   Procedure: TENDON TRANSFER;  Surgeon: Bradly Bienenstock, MD;  Location: [facility];  Service: Orthopedics;  Laterality: Right;   TONSILLECTOMY     TOTAL KNEE ARTHROPLASTY Left 04/24/2023   Procedure: TOTAL KNEE ARTHROPLASTY;  Surgeon: Ollen Gross, MD;  Location: [facility];  Service: Orthopedics;  Laterality: Left;   TUBAL LIGATION     TURBINATE REDUCTION Bilateral 08/15/2022   Procedure: TURBINATE REDUCTION;  Surgeon: Newman Pies, MD;  Location: [facility];  Service: ENT;  Laterality: Bilateral;   WRIST SURGERY     left, removal of cyst   Patient Active Problem List   Diagnosis Date Noted   Pure hypercholesterolemia 10/30/2023   PVC's (premature ventricular contractions) 10/30/2023   CAD (coronary artery disease) 10/30/2023   Mass of buttock 08/08/2023   Vertigo 06/20/2023   Arthrofibrosis of total knee replacement (HCC) 06/05/2023   History of total knee arthroplasty 2024, Dr. 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   Laryngospasms 04/21/2022   Muscle tension dysphonia 04/21/2022   Old tear of meniscus of left knee 09/28/2021   Combined hyperlipidemia associated with type 2 diabetes mellitus (HCC) 09/28/2021   Gout 07/19/2021   S/P shoulder replacement, right 12/14/2020   Rotator cuff tear arthropathy 10/22/2020   Nonischemic cardiomyopathy (HCC) 10/06/2020   Heart failure with mildly reduced ejection fraction (HFmrEF, 41-49%) (HCC) 08/24/2020   Primary osteoarthritis of left shoulder 07/29/2020   DDD (degenerative disc disease), cervical 04/30/2020   Osteoarthritis of left knee 11/05/2018   Chronic pansinusitis 10/04/2017   Chronic low back pain 09/05/2017   Degenerative lumbar disc 06/04/2015   S/P lumbar laminectomy 06/04/2015   Restless leg syndrome 05/13/2014   Type 2 diabetes  mellitus with peripheral neuropathy (HCC) 04/09/2013   Spinal stenosis of lumbar region without neurogenic claudication 09/12/2012   GERD (gastroesophageal reflux disease) 05/25/2011   Hemorrhoids, internal, with bleeding 05/25/2011   Hypertension associated with diabetes (HCC) 11/08/2010   Chronic allergic rhinitis 08/19/2010   Mitral valve prolapse 10/10/2008    REFERRING PROVIDER: Asencion Partridge  REFERRING DIAG: Spinal stenosis of lumbar region without neurogenic claudication.  Rationale for Evaluation and Treatment: Rehabilitation  THERAPY DIAG:  Other low back pain  Abnormal posture  Muscle spasm of back  ONSET DATE: Ongoing.  Per patient;  "Too long to remember."  SUBJECTIVE:  SUBJECTIVE STATEMENT: Pt reports 3/10 low back pain today.  Pt states doing better over all.   PERTINENT HISTORY:  OP.  Right TKA  PAIN:  Are you having pain? 4/10. Ache, throb.  PRECAUTIONS: Other: OP.  RED FLAGS: None   WEIGHT BEARING RESTRICTIONS: No  FALLS:  Has patient fallen in last 6 months? Yes. Number of falls Coming off a ladder and missed last step.  LIVING ENVIRONMENT: Lives with: lives with their family Lives in: House/apartment Stairs: 14 steps; on right going up and External: 1 steps; none  Has following equipment at home: Single point cane  OCCUPATION: Retired.  PLOF: Independent  PATIENT GOALS: Decrease back pain and to be able to walk more and do more around the house.    OBJECTIVE:  Note: Objective measures were completed at Evaluation unless otherwise noted.  PATIENT SURVEYS:  Modified Oswestry 12/50   POSTURE: rounded shoulders, forward head, decreased lumbar lordosis, increased thoracic kyphosis, and flexed trunk , right knee flexion ~12 degrees, scoliosis, left-ward  lateral lean/shift.    PALPATION: The patient with palpable tenderness and very taut right lumbar musculature.   LOWER EXTREMITY ROM:     In supine: Normal bilateral hip flexion.  Right knee in ~12 degrees of flexion.  LOWER EXTREMITY MMT:   Patient able to provide a 5/5 strength grade   LUMBAR SPECIAL TESTS:  (-) SLR testing.   GAIT: The patient walks in a flexed trunk posture using a straight cane.  TREATMENT DATE:                                    11/08/23 EXERCISE LOG  Exercise Repetitions and Resistance Comments  Nustep  L4 x 16 minutes    Rocker board  5 minutes   Seated hip ADD isometric  3 minutes w/ 5 second hold   Marching on foam   BUE support from parallel bars  Tandem Gait  Airex  beam  x 3 mins   Side stepping Airex  beam x 3 mins   Resisted rows RED t-band 2x 10 With cueing for upright stance  Standing ball press     LAQ   Alternating LE   Blank cell = exercise not performed today  Modalities: no redness or adverse reaction to today's modalities Discussed and reviewed HEP with AB bracing with standing ABD and marching. Date:  Unattended Estim: R lumbar paraspinals and QL, IFC @ 80-150 Hz w/ 40% scan, 15 mins, Pain and Tone Hot Pack: Lumbar, 15 mins, Pain and Tone Seated  10/30/23:  Nustep level 4 x 17 minutes f/b Patient in left SDLY position with folded pillow between knees for comfort:  STW/M x 8 minutes including ischemic release technique with focus on right QL f/b HMP and IFC at 80-150 Hx on 40% scan x 20 minutes to patient's right lumbar region.  Normal modality response following removal of modality.                                   10/25/23 EXERCISE LOG  Exercise Repetitions and Resistance Comments  Nustep  L3-4 x 17 minutes   Seated hip ADD isometric  3 minutes w/ 5 second hold   LAQ 3# x 2 minutes  Alternating LE   Seated marching  3# x 2.5 minutes  Alternating LE   Standing hip  hike  25 reps each    Isometric ball press  3 minutes w/ 5 second     Rocker board  4 minutes    Blank cell = exercise not performed today   PATIENT EDUCATION:  Education details:  Person educated:  International aid/development worker:  Education comprehension:   HOME EXERCISE PROGRAM:   ASSESSMENT:  CLINICAL IMPRESSION: Pt arrives for today's treatment session reporting 3/10 low back pain and doing better over all since starting PT. She was able to continue with core and LE strengthening exs as well as proprioception act.'s with pain remaining 3/10. Normal responses to estim and MH noted upon removal.  Pt reported 2/10 low back pain at completion of today's treatment session.    OBJECTIVE IMPAIRMENTS: Abnormal gait, decreased activity tolerance, decreased ROM, increased muscle spasms, postural dysfunction, and pain.   ACTIVITY LIMITATIONS: carrying, lifting, bending, standing, and locomotion level  PARTICIPATION LIMITATIONS: meal prep, cleaning, laundry, community activity, and yard work  PERSONAL FACTORS: Time since onset of injury/illness/exacerbation and 1-2 comorbidities: OP, right TKA  are also affecting patient's functional outcome.   REHAB POTENTIAL: Good  CLINICAL DECISION MAKING: Evolving/moderate complexity  EVALUATION COMPLEXITY: Moderate   GOALS:  LONG TERM GOALS: Target date: 12/01/23  Ind with a HEP.  Goal status: IN PROGRESS  2.  Walk a community distance with pain not > 4/10.  Goal status: IN PROGRESS  3.  Perform ADL's with pain not > 4/10.  Goal status: IN PROGRESS  PLAN:  PT FREQUENCY: 2x/week  PT DURATION: 6 weeks  PLANNED INTERVENTIONS: 97110-Therapeutic exercises, 97530- Therapeutic activity, [id number]- Neuromuscular re-education, 97535- Self Care, 16109- Manual therapy, [id number]- Electrical stimulation (unattended), 97035- Ultrasound, Patient/Family education, Dry Needling, Cryotherapy, and Moist heat.  PLAN FOR NEXT SESSION: Combo e'stim/US at 1.50 W/CM2, STW/M, core exercise progression, spinal protection techniques and body  mechanics training.   Stanly Si,CHRIS, PTA 11/08/2023, 12:06 PM

## 2023-11-09 ENCOUNTER — Ambulatory Visit (HOSPITAL_COMMUNITY): Attending: Physician Assistant

## 2023-11-09 DIAGNOSIS — E1159 Type 2 diabetes mellitus with other circulatory complications: Secondary | ICD-10-CM

## 2023-11-09 DIAGNOSIS — I2510 Atherosclerotic heart disease of native coronary artery without angina pectoris: Secondary | ICD-10-CM | POA: Diagnosis present

## 2023-11-09 DIAGNOSIS — E7800 Pure hypercholesterolemia, unspecified: Secondary | ICD-10-CM | POA: Diagnosis present

## 2023-11-09 DIAGNOSIS — I152 Hypertension secondary to endocrine disorders: Secondary | ICD-10-CM

## 2023-11-09 DIAGNOSIS — R072 Precordial pain: Secondary | ICD-10-CM | POA: Diagnosis present

## 2023-11-09 DIAGNOSIS — I5020 Unspecified systolic (congestive) heart failure: Secondary | ICD-10-CM | POA: Diagnosis present

## 2023-11-09 LAB — MYOCARDIAL PERFUSION IMAGING
LV dias vol: 117 mL (ref 46–106)
LV sys vol: 59 mL
Nuc Stress EF: 50 %
Peak HR: 88 {beats}/min
Rest HR: 67 {beats}/min
Rest Nuclear Isotope Dose: 10.9 mCi
SDS: 1
SRS: 2
SSS: 3
ST Depression (mm): 0 mm
Stress Nuclear Isotope Dose: 29.1 mCi
TID: 0.96

## 2023-11-09 MED ORDER — TECHNETIUM TC 99M TETROFOSMIN IV KIT
10.9000 | PACK | Freq: Once | INTRAVENOUS | Status: AC | PRN
  Administered 2023-11-09: 10.9 via INTRAVENOUS

## 2023-11-09 MED ORDER — REGADENOSON 0.4 MG/5ML IV SOLN
0.4000 mg | Freq: Once | INTRAVENOUS | Status: AC
  Administered 2023-11-09: 0.4 mg via INTRAVENOUS

## 2023-11-09 MED ORDER — TECHNETIUM TC 99M TETROFOSMIN IV KIT
29.1000 | PACK | Freq: Once | INTRAVENOUS | Status: DC | PRN
Start: 2023-11-09 — End: 2023-11-09

## 2023-11-09 MED ORDER — TECHNETIUM TC 99M TETROFOSMIN IV KIT
29.1000 | PACK | Freq: Once | INTRAVENOUS | Status: AC | PRN
  Administered 2023-11-09: 29.1 via INTRAVENOUS

## 2023-11-17 ENCOUNTER — Ambulatory Visit
Admission: RE | Admit: 2023-11-17 | Discharge: 2023-11-17 | Disposition: A | Discharge status: Home / Self Care | Source: Ambulatory Visit | Attending: Family Medicine | Admitting: Family Medicine

## 2023-11-17 ENCOUNTER — Other Ambulatory Visit: Payer: Self-pay | Admitting: Family Medicine

## 2023-11-17 DIAGNOSIS — Z1231 Encounter for screening mammogram for malignant neoplasm of breast: Secondary | ICD-10-CM

## 2023-11-17 LAB — BASIC METABOLIC PANEL WITH GFR
BUN/Creatinine Ratio: 18 (ref 12–28)
BUN: 24 mg/dL (ref 8–27)
CO2: 22 mmol/L (ref 20–29)
Calcium: 9.6 mg/dL (ref 8.7–10.3)
Chloride: 106 mmol/L (ref 96–106)
Creatinine, Ser: 1.34 mg/dL — ABNORMAL HIGH (ref 0.57–1.00)
Glucose: 150 mg/dL — ABNORMAL HIGH (ref 70–99)
Potassium: 4.4 mmol/L (ref 3.5–5.2)
Sodium: 142 mmol/L (ref 134–144)
eGFR: 41 mL/min/{1.73_m2} — ABNORMAL LOW (ref >59)

## 2023-11-29 ENCOUNTER — Telehealth: Payer: Self-pay | Admitting: Internal Medicine

## 2023-11-29 DIAGNOSIS — I428 Other cardiomyopathies: Secondary | ICD-10-CM

## 2023-11-29 MED ORDER — DAPAGLIFLOZIN PROPANEDIOL 10 MG PO TABS: 10.0000 mg | ORAL_TABLET | Freq: Every day | ORAL | 3 refills | Status: DC

## 2023-11-29 NOTE — Telephone Encounter (Signed)
*  STAT* If patient is at the pharmacy, call can be transferred to refill team.   1. Which medications need to be refilled? (please list name of each medication and dose if known)   dapagliflozin  propanediol (FARXIGA ) 10 MG TABS tablet       4. Which pharmacy/location (including street and city if local pharmacy) is medication to be sent to?  [facility] - [place], [place] - [address], [address] Phone: [phone]  Fax: [phone]       5. Do they need a 30 day or 90 day supply? 90

## 2023-11-29 NOTE — Telephone Encounter (Signed)
 Pt's medication was sent to pt's pharmacy as requested. Confirmation received.

## 2023-12-01 ENCOUNTER — Other Ambulatory Visit: Payer: Self-pay | Admitting: Internal Medicine

## 2023-12-12 ENCOUNTER — Other Ambulatory Visit: Payer: Self-pay | Admitting: Surgery

## 2023-12-12 NOTE — Progress Notes (Unsigned)
 Cardiology Office Note:    Date:  12/13/2023  ID:  NYEASHA VOLKERT, DOB 1946/05/11, MRN [id number] PCP: Luevenia Saha, MD  [facility] Providers Cardiologist:  Ola Berger, MD       Patient Profile:       Coronary artery disease (nonobstructive) LHC 09/07/20: mid LAD 10-20%, otherwise no significant CAD Lexiscan  MPI 11/09/2023: No ischemia or infarction, no RWMA, EF 50, low risk Heart failure with mildly reduced ejection fraction Non-ischemic cardiomyopathy (dx during admission for COVID in 2022) TTE (08/14/2020): EF 20-25% TTE (10/15/2020): EF 30-35% TTE (01/26/2021): EF 45% TTE (01/10/2022): EF 45-50%, Grade 1 diastolic dysfunction, Normal RVSF, Trivial MR, Mitral valve prolapse Hypertension Hyperlipidemia Chronic kidney disease with a GFR of 41 Diabetes mellitus PVCs  10% burden (monitor 2022)            Discussed the use of AI scribe software for clinical note transcription with the patient, who gave verbal consent to proceed.  History of Present Illness [name] is a 78 y.o. female who returns for follow up of HTN, CHF, CAD with chest pain. She was last seen in 10/2023. Her BP was above goal and I increased her Entresto  to 97/103 mg twice daily. I set her up for a Lexiscan  MPI to evaluated for ischemia due to recent episodes of L sided chest pain. This was low risk and neg for ischemia.   She is here alone. She experienced significant coughing with higher dose Entresto  and reduced back to the lower dose after a month. Her blood pressure has remained variable. She experiences chest pressure or heaviness, often in the mornings, which she attributes to heartburn. The discomfort worsens if she does not take her Pepcid  at night. She takes omeprazole  in the morning and Pepcid  at night, which helps manage her symptoms. No significant worsening of symptoms with exertion, though she occasionally feels short of breath when climbing stairs. She sleeps on two pillows and  reports no significant leg swelling.     ROS-See HPI       Studies Reviewed:        Results LABS LDL: 38 (10/06/2023) Hemoglobin: 11.6 (10/06/2023) Creatinine: 1.34 (11/16/2023) Potassium: 4.4 (11/16/2023) ALT: 13 (10/06/2023)    Risk Assessment/Calculations:     HYPERTENSION CONTROL Vitals:   12/13/23 0954 12/13/23 1035  BP: (!) 130/50 (!) 150/60    The patient's blood pressure is elevated above target today.  In order to address the patient's elevated BP: A new medication was prescribed today.          Physical Exam:   VS:  BP (!) 150/60   Pulse 68   Ht 5\' 4"  (1.626 m)   Wt 198 lb 12.8 oz (90.2 kg)   SpO2 97%   BMI 34.12 kg/m    Wt Readings from Last 3 Encounters:  12/13/23 198 lb 12.8 oz (90.2 kg)  11/09/23 197 lb (89.4 kg)  11/07/23 199 lb (90.3 kg)    Constitutional:      Appearance: Healthy appearance. Not in distress.  Neck:     Vascular: JVD normal.  Pulmonary:     Breath sounds: Normal breath sounds. No wheezing. No rales.  Cardiovascular:     Normal rate. Regular rhythm.     Murmurs: There is no murmur.  Edema:    Peripheral edema absent.  Abdominal:     Palpations: Abdomen is soft.        Assessment and Plan:   Assessment & Plan Heart  failure with mildly reduced ejection fraction EF improved from 20% in 2022 to 45-50% by echocardiogram in June 2023. NYHA class II, volume status stable. - Continue carvedilol  3.125 mg twice daily. - Continue Farxiga  10 mg daily. - Continue Lasix  20 mg every Monday, Wednesday, Friday, and Saturday. - Continue spironolactone  12.5 mg daily. - Continue Entresto  49/51 mg twice daily.  Hypertension Hypertension remains above target. She could not tolerate a higher dose of Entresto  due to cough, and her heart rate precludes further titration of carvedilol . Current symptoms and renal function do not warrant adjustment of diuretics.   - Continue carvedilol  3.125 mg twice daily. - Continue spironolactone  12.5 mg  daily. - Continue Entresto  49/51 mg twice daily. - Start hydralazine  10 mg three times a day. - Follow up in August as scheduled.  Coronary artery disease  Cardiac catheterization in February 2022 showing 10-20% mid LAD stenosis, otherwise no significant CAD. Recent Lexiscan  nuclear stress test demonstrated no ischemia. Chest symptoms are more related to acid reflux and are non-cardiac. - Continue Crestor  20 mg daily.  Gastroesophageal reflux disease (GERD) Chest symptoms appear to be related to acid reflux rather than cardiac issues. She takes omeprazole  in the morning and Pepcid  at night, which helps manage symptoms.   - Refer to gastroenterologist for further evaluation and management of GERD.           Dispo:  Return in about 3 months (around 03/07/2024) for Scheduled Follow Up, w/ Dr. Avanell Bob.  Signed, Marlyse Single, PA-C

## 2023-12-13 ENCOUNTER — Encounter: Payer: Self-pay | Admitting: Physician Assistant

## 2023-12-13 ENCOUNTER — Ambulatory Visit: Attending: Physician Assistant | Admitting: Physician Assistant

## 2023-12-13 VITALS — BP 150/60 | HR 68 | Ht 64.0 in | Wt 198.8 lb

## 2023-12-13 DIAGNOSIS — I2510 Atherosclerotic heart disease of native coronary artery without angina pectoris: Secondary | ICD-10-CM | POA: Diagnosis present

## 2023-12-13 DIAGNOSIS — I5020 Unspecified systolic (congestive) heart failure: Secondary | ICD-10-CM | POA: Diagnosis not present

## 2023-12-13 DIAGNOSIS — R079 Chest pain, unspecified: Secondary | ICD-10-CM | POA: Diagnosis present

## 2023-12-13 DIAGNOSIS — K219 Gastro-esophageal reflux disease without esophagitis: Secondary | ICD-10-CM | POA: Diagnosis not present

## 2023-12-13 DIAGNOSIS — I152 Hypertension secondary to endocrine disorders: Secondary | ICD-10-CM | POA: Insufficient documentation

## 2023-12-13 DIAGNOSIS — E1159 Type 2 diabetes mellitus with other circulatory complications: Secondary | ICD-10-CM | POA: Insufficient documentation

## 2023-12-13 MED ORDER — HYDRALAZINE HCL 10 MG PO TABS: 10.0000 mg | ORAL_TABLET | Freq: Three times a day (TID) | ORAL | 3 refills | Status: DC

## 2023-12-13 NOTE — Patient Instructions (Signed)
 Medication Instructions:  Your physician has recommended you make the following change in your medication:   START Hydralazine  10 mg taking 1 three times a day  *If you need a refill on your cardiac medications before your next appointment, please call your pharmacy*  Lab Work: None ordered  If you have labs (blood work) drawn today and your tests are completely normal, you will receive your results only by: MyChart Message (if you have MyChart) OR A paper copy in the mail If you have any lab test that is abnormal or we need to change your treatment, we will call you to review the results.  Testing/Procedures: None ordered   You have been referred to Back to Dr. Jadene Maxwell office for GERD and Chest Pain   Follow-Up: At [facility], you and your health needs are our priority.  As part of our continuing mission to provide you with exceptional heart care, our providers are all part of one team.  This team includes your primary Cardiologist (physician) and Advanced Practice Providers or APPs (Physician Assistants and Nurse Practitioners) who all work together to provide you with the care you need, when you need it.  Your next appointment:   As scheduled   Provider:   Ola Berger, MD or Marlyse Single, PA-C          We recommend signing up for the patient portal called "MyChart".  Sign up information is provided on this After Visit Summary.  MyChart is used to connect with patients for Virtual Visits (Telemedicine).  Patients are able to view lab/test results, encounter notes, upcoming appointments, etc.  Non-urgent messages can be sent to your provider as well.   To learn more about what you can do with MyChart, go to [email or web address].   Other Instructions

## 2024-01-05 ENCOUNTER — Encounter: Payer: Self-pay | Admitting: Family Medicine

## 2024-01-05 ENCOUNTER — Ambulatory Visit: Admitting: Family Medicine

## 2024-01-05 VITALS — BP 131/68 | HR 81 | Temp 98.0°F | Ht 64.0 in | Wt 199.0 lb

## 2024-01-05 DIAGNOSIS — I152 Hypertension secondary to endocrine disorders: Secondary | ICD-10-CM

## 2024-01-05 DIAGNOSIS — E1142 Type 2 diabetes mellitus with diabetic polyneuropathy: Secondary | ICD-10-CM | POA: Diagnosis not present

## 2024-01-05 DIAGNOSIS — N1831 Chronic kidney disease, stage 3a: Secondary | ICD-10-CM

## 2024-01-05 DIAGNOSIS — Z794 Long term (current) use of insulin: Secondary | ICD-10-CM

## 2024-01-05 DIAGNOSIS — E1159 Type 2 diabetes mellitus with other circulatory complications: Secondary | ICD-10-CM

## 2024-01-05 DIAGNOSIS — I428 Other cardiomyopathies: Secondary | ICD-10-CM

## 2024-01-05 DIAGNOSIS — K219 Gastro-esophageal reflux disease without esophagitis: Secondary | ICD-10-CM

## 2024-01-05 DIAGNOSIS — I5020 Unspecified systolic (congestive) heart failure: Secondary | ICD-10-CM | POA: Diagnosis not present

## 2024-01-05 LAB — POCT GLYCOSYLATED HEMOGLOBIN (HGB A1C): Hemoglobin A1C: 7.3 % — AB (ref 4.0–5.6)

## 2024-01-05 NOTE — Progress Notes (Signed)
 Subjective  CC:  Chief Complaint  Patient presents with   Diabetes    HPI: [name] is a 78 y.o. female who presents to the office today for follow up of diabetes and problems listed above in the chief complaint.  Discussed the use of AI scribe software for clinical note transcription with the patient, who gave verbal consent to proceed.  History of Present Illness [name] "[name]" is a 78 year old female with diabetes who presents for follow-up of her blood sugar management and gastrointestinal symptoms.  Diabetes f/u: Her blood sugar levels have improved, with her A1c decreasing from 7.9 to 7.3. She experiences hypoglycemic episodes approximately once a week, particularly before lunch if she has been active outside. She does not check her blood sugar in the mornings and consumes a smaller breakfast to avoid nausea. Her current insulin  dose is 24 units in the morning, and she is concerned about running out of insulin  before her prescription is due in July.  She experiences gastrointestinal discomfort, describing a pain she believes is related to gas. She has undergone an EKG and a stress test, both of which were normal. She experiences gas and sometimes regurgitates food, which she finds unpleasant. She takes an acid-reducing pill in the morning and Pepcid  at night, and sometimes uses Gas-X to alleviate symptoms. She is scheduled to see a GI specialist in August for further evaluation.  No chest pain or breathing problems. The pain is always on one side.    Wt Readings from Last 3 Encounters:  01/05/24 199 lb (90.3 kg)  12/13/23 198 lb 12.8 oz (90.2 kg)  11/09/23 197 lb (89.4 kg)    BP Readings from Last 3 Encounters:  01/05/24 131/68  12/13/23 (!) 150/60  11/07/23 (!) 150/79    Assessment  1. Type 2 diabetes mellitus with peripheral neuropathy (HCC)   2. Heart failure with mildly reduced ejection fraction (HFmrEF, 41-49%) (HCC)   3. Hypertension associated with  diabetes (HCC)   4. Stage 3a chronic kidney disease (HCC)   5. Nonischemic cardiomyopathy (HCC)   6. Gastroesophageal reflux disease, unspecified whether esophagitis present      Plan  Assessment and Plan Assessment & Plan Diabetes Mellitus Type 2 A1c improved to 7.3. Occasional hypoglycemia before lunch with physical activity. - Reduce Lantus  to 22 units. - Send new prescription for Lantus . - Advise increased caloric intake before physical activity.  HTN is controlled CHF is stable  Gastroesophageal Reflux Disease (GERD) Experiences gas, pain, and regurgitation. Scheduled for gastroenterologist evaluation in August. - Continue current acid-reducing medication regimen. On ppi and pepcid  - Follow up with gastroenterologist in August.    Follow up: 6 mo for dm and htn recheck Orders Placed This Encounter  Procedures   POCT HgB A1C   No orders of the defined types were placed in this encounter.     Immunization History  Administered Date(s) Administered   Fluad Quad(high Dose 65+) 04/19/2018, 06/16/2020, 07/01/2020, 04/16/2021, 05/10/2022   Fluad Trivalent(High Dose 65+) 05/24/2023   Influenza Split 06/01/2010   Influenza, High Dose Seasonal PF 05/13/2014, 04/24/2015, 04/12/2017, 04/19/2018, 04/24/2019, 06/16/2020   Influenza, Seasonal, Injecte, Preservative Fre 04/09/2013, 05/03/2016   Influenza,inj,Quad PF,6+ Mos 05/01/2017   Influenza-Unspecified 04/03/2012   Moderna Sars-Covid-2 Vaccination 01/15/2021, 02/12/2021   Pneumococcal Conjugate-13 05/13/2014   Pneumococcal Polysaccharide-23 02/15/2011   Td 08/01/1998   Td (Adult), 2 Lf Tetanus Toxid, Preservative Free 08/01/1998   Tdap 05/01/2004   Unspecified SARS-COV-2 Vaccination 05/11/2021  Zoster Recombinant(Shingrix) 04/24/2019, 08/13/2019   Zoster, Live 02/15/2011    Diabetes Related Lab Review: Lab Results  Component Value Date   HGBA1C 7.3 (A) 01/05/2024   HGBA1C 7.9 (A) 10/06/2023   HGBA1C 7.7 (A)  07/07/2023    Lab Results  Component Value Date   MICROALBUR <0.7 10/06/2023   Lab Results  Component Value Date   CREATININE 1.34 (H) 11/16/2023   BUN 24 11/16/2023   NA 142 11/16/2023   K 4.4 11/16/2023   CL 106 11/16/2023   CO2 22 11/16/2023   Lab Results  Component Value Date   CHOL 119 10/06/2023   CHOL 109 10/04/2022   CHOL 111 09/28/2021   Lab Results  Component Value Date   HDL 58.50 10/06/2023   HDL 57.90 10/04/2022   HDL 55.70 09/28/2021   Lab Results  Component Value Date   LDLCALC 38 10/06/2023   LDLCALC 31 10/04/2022   LDLCALC 37 09/28/2021   Lab Results  Component Value Date   TRIG 111.0 10/06/2023   TRIG 102.0 10/04/2022   TRIG 91.0 09/28/2021   Lab Results  Component Value Date   CHOLHDL 2 10/06/2023   CHOLHDL 2 10/04/2022   CHOLHDL 2 09/28/2021   No results found for: "LDLDIRECT" The ASCVD Risk score (Arnett DK, et al., 2019) failed to calculate for the following reasons:   The valid total cholesterol range is 130 to 320 mg/dL I have reviewed the PMH, Fam and Soc history. Patient Active Problem List   Diagnosis Date Noted Date Diagnosed   Stage 3a chronic kidney disease (HCC) 10/04/2022     Priority: High   Combined hyperlipidemia associated with type 2 diabetes mellitus (HCC) 09/28/2021     Priority: High   Nonischemic cardiomyopathy (HCC) 10/06/2020     Priority: High   Heart failure with mildly reduced ejection fraction (HFmrEF, 41-49%) (HCC) 08/24/2020     Priority: High    Non-ischemic cardiomyopathy (dx during admission for COVID in 2022) TTE (08/14/2020): EF 20-25% TTE (10/15/2020): EF 30-35% TTE (01/26/2021): EF 45% TTE (01/10/2022): EF 45-50%, Grade 1 diastolic dysfunction, Normal RVSF, Trivial MR, Mitral valve prolapse    Type 2 diabetes mellitus with peripheral neuropathy (HCC) 04/09/2013     Priority: High    New onset 04/2013, doesn't tolerate 1000mg  metformin  due to GI upset. Farxiga , lantus , met 500 bid lyrica      Hypertension associated with diabetes (HCC) 11/08/2010     Priority: High   History of total knee arthroplasty 2024, Dr. 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     Priority: Medium    Gout 07/19/2021     Priority: Medium     On allopurinol     Rotator cuff tear arthropathy 10/22/2020     Priority: Medium    Primary osteoarthritis of left shoulder 07/29/2020     Priority: Medium    DDD (degenerative disc disease), cervical 04/30/2020     Priority: Medium    Osteoarthritis of left knee 11/05/2018     Priority: Medium    Chronic low back pain 09/05/2017     Priority: Medium    Degenerative lumbar disc 06/04/2015     Priority: Medium    S/P lumbar laminectomy 06/04/2015     Priority:  Medium    Restless leg syndrome 05/13/2014     Priority: Medium    Spinal stenosis of lumbar region without neurogenic claudication 09/12/2012     Priority: Medium     Formatting of this note might be different from the original. Right L4/5    GERD (gastroesophageal reflux disease) 05/25/2011     Priority: Medium     Chronic recurrent heartburn symptoms for 3 or 4 years without weight loss, bleeding or dysphagia. Bonds well to daily PPI.Kenney Peacemaker, MD, [facility]     Mitral valve prolapse 10/10/2008     Priority: Medium     Formatting of this note might be different from the original. 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 Darlin Ehrlich, CT scan, sinus surgery 2023    Hemorrhoids,  internal, with bleeding 05/25/2011     Priority: Low    Visible on 2005 colonoscopy and endoscopy 05/25/2011.    Chronic allergic rhinitis 08/19/2010     Priority: Low   Pure hypercholesterolemia 10/30/2023    PVC's (premature ventricular contractions) 10/30/2023    CAD (coronary artery disease) 10/30/2023     LHC 09/07/20: mid LAD 10-20%, otherwise no significant CAD Lexiscan  MPI 11/09/2023: No ischemia or infarction, no RWMA, EF 50, low risk    Mass of buttock 08/08/2023    Vertigo 06/20/2023    Arthrofibrosis of total knee replacement (HCC) 06/05/2023     Social History: Patient  reports that she has never smoked. She has never used smokeless tobacco. She reports that she does not drink alcohol and does not use drugs.  Review of Systems: Ophthalmic: negative for eye pain, loss of vision or double vision Cardiovascular: negative for chest pain Respiratory: negative for SOB or persistent cough Gastrointestinal: negative for abdominal pain Genitourinary: negative for dysuria or gross hematuria MSK: negative for foot lesions Neurologic: negative for weakness or gait disturbance  Objective  Vitals: BP 131/68   Pulse 81   Temp 98 F (36.7 C)   Ht 5\' 4"  (1.626 m)   Wt 199 lb (90.3 kg)   SpO2 97%   BMI 34.16 kg/m  General: well appearing, no acute distress  Psych:  Alert and oriented, normal mood and affect HEENT:  Normocephalic, atraumatic, moist mucous membranes, supple neck  Cardiovascular:  Nl S1 and S2, RRR no edema Respiratory:  Good breath sounds bilaterally, CTAB with normal effort, no rales  Diabetic education: ongoing education regarding chronic disease management for diabetes was given today. We continue to reinforce the ABC's of diabetic management: A1c (<7 or 8 dependent upon patient), tight blood pressure control, and cholesterol management with goal LDL < 100 minimally. We discuss diet strategies, exercise recommendations, medication options and possible side  effects. At each visit, we review recommended immunizations and preventive care recommendations for diabetics and stress that good diabetic control can prevent other problems. See below for this patient's data. Commons side effects, risks, benefits, and alternatives for medications and treatment plan prescribed today were discussed, and the patient expressed understanding of the given instructions. Patient is instructed to call or  message via MyChart if he/she has any questions or concerns regarding our treatment plan. No barriers to understanding were identified. We discussed Red Flag symptoms and signs in detail. Patient expressed understanding regarding what to do in case of urgent or emergency type symptoms.  Medication list was reconciled, printed and provided to the patient in AVS. Patient instructions and summary information was reviewed with the patient as documented in the AVS. This note was prepared with assistance of Dragon voice recognition software. Occasional wrong-word or sound-a-like substitutions may have occurred due to the inherent limitations of voice recognition software

## 2024-01-05 NOTE — Patient Instructions (Signed)
 Please return in 6 months to recheck diabetes and blood pressure   If you have any questions or concerns, please don't hesitate to send me a message via MyChart or call the office at [phone]. Thank you for visiting with us  today! It's our pleasure caring for you.    VISIT SUMMARY: Today, we discussed your blood sugar management and gastrointestinal symptoms. Your blood sugar levels have improved, but you are experiencing occasional low blood sugar episodes. You also have ongoing gastrointestinal discomfort, which we are managing with medication and a planned specialist visit.  YOUR PLAN: -DIABETES MELLITUS TYPE 2: Diabetes Mellitus Type 2 is a condition where your body does not use insulin  properly, leading to high blood sugar levels. Your A1c has improved to 7.3, but you are experiencing occasional low blood sugar episodes before lunch, especially with physical activity. We have reduced your Lantus  dose to 22 units and sent a new prescription. To prevent low blood sugar, try to eat a small snack like a scoop of peanut butter or toast with peanut butter and blueberries before being active.  -GASTROESOPHAGEAL REFLUX DISEASE (GERD): GERD is a condition where stomach acid frequently flows back into the tube connecting your mouth and stomach, causing discomfort. You are experiencing gas, pain, and regurgitation. Continue your current acid-reducing medications and follow up with the gastroenterologist in August for further evaluation.  INSTRUCTIONS: Please follow up with the gastroenterologist in August as scheduled. Continue monitoring your blood sugar levels and adjust your diet as discussed to prevent low blood sugar episodes. If you have any concerns or experience any new symptoms, please contact our office.                      Contains text generated by Abridge.                                 Contains text generated by Abridge.

## 2024-01-08 ENCOUNTER — Ambulatory Visit: Admitting: Family Medicine

## 2024-01-16 ENCOUNTER — Other Ambulatory Visit: Payer: Self-pay | Admitting: Family Medicine

## 2024-01-16 MED ORDER — LANTUS SOLOSTAR 100 UNIT/ML ~~LOC~~ SOPN: 25.0000 [IU] | PEN_INJECTOR | Freq: Every day | SUBCUTANEOUS | Status: DC

## 2024-01-16 NOTE — Telephone Encounter (Signed)
 Copied from CRM [id number]. Topic: Clinical - Medication Refill >> Jan 16, 2024 10:19 AM Dorisann Garre T wrote: Medication: insulin  glargine (LANTUS  SOLOSTAR) 100 UNIT/ML Solostar Pen [[id number]]  Has the patient contacted their pharmacy? Yes (Agent: If no, request that the patient contact the pharmacy for the refill. If patient does not wish to contact the pharmacy document the reason why and proceed with request.) (Agent: If yes, when and what did the pharmacy advise?)  This is the patient's preferred pharmacy:  [id number] - [place],  - [address] [place] [address] Phone: [phone] Fax: [phone]  Is this the correct pharmacy for this prescription? Yes If no, delete pharmacy and type the correct one.   Has the prescription been filled recently? No  Is the patient out of the medication? Yes  Has the patient been seen for an appointment in the last year OR does the patient have an upcoming appointment? Yes  Can we respond through MyChart? Yes  Agent: Please be advised that Rx refills may take up to 3 business days. We ask that you follow-up with your pharmacy.

## 2024-01-22 ENCOUNTER — Telehealth: Payer: Self-pay

## 2024-01-22 ENCOUNTER — Other Ambulatory Visit: Payer: Self-pay

## 2024-01-22 MED ORDER — LANTUS SOLOSTAR 100 UNIT/ML ~~LOC~~ SOPN: 25.0000 [IU] | PEN_INJECTOR | Freq: Every day | SUBCUTANEOUS | 2 refills | Status: DC

## 2024-01-22 NOTE — Telephone Encounter (Signed)
 FYI Only or Action Required?: Action required by provider: medication refill request.    >> Jan 22, 2024  9:37 AM Powell HERO wrote: Patient still waiting on this prescription to be filled and she still hasn't heard anything about it. Requests someone to follow up with her ASAP.  Copied from CRM [id number]. Topic: Clinical - Medication Refill >> Jan 16, 2024 10:19 AM Deidre T wrote: Medication: insulin  glargine (LANTUS  SOLOSTAR) 100 UNIT/ML Solostar Pen [[id number]]  Has the patient contacted their pharmacy? Yes (Agent: If no, request that the patient contact the pharmacy for the refill. If patient does not wish to contact the pharmacy document the reason why and proceed with request.) (Agent: If yes, when and what did the pharmacy advise?)  This is the patient's preferred pharmacy:  [id number] - [place], [place] - [address] [place] [address] Phone: [phone] Fax: [phone]  Is this the correct pharmacy for this prescription? Yes If no, delete pharmacy and type the correct one.   Has the prescription been filled recently? No  Is the patient out of the medication? Yes  Has the patient been seen for an appointment in the last year OR does the patient have an upcoming appointment? Yes  Can we respond through MyChart? Yes  Agent: Please be advised that Rx refills may take up to 3 business days. We ask that you follow-up with your pharmacy.

## 2024-01-23 ENCOUNTER — Encounter: Payer: Self-pay | Admitting: Gastroenterology

## 2024-01-30 ENCOUNTER — Other Ambulatory Visit: Payer: Self-pay | Admitting: Family Medicine

## 2024-02-12 ENCOUNTER — Other Ambulatory Visit: Payer: Self-pay | Admitting: Family Medicine

## 2024-03-01 ENCOUNTER — Ambulatory Visit: Admitting: Gastroenterology

## 2024-03-01 ENCOUNTER — Encounter: Payer: Self-pay | Admitting: Gastroenterology

## 2024-03-01 VITALS — BP 136/78 | HR 65 | Ht 64.0 in | Wt 199.4 lb

## 2024-03-01 DIAGNOSIS — K219 Gastro-esophageal reflux disease without esophagitis: Secondary | ICD-10-CM

## 2024-03-01 DIAGNOSIS — R1310 Dysphagia, unspecified: Secondary | ICD-10-CM

## 2024-03-01 DIAGNOSIS — R1319 Other dysphagia: Secondary | ICD-10-CM | POA: Insufficient documentation

## 2024-03-01 NOTE — Patient Instructions (Signed)
 You have been scheduled for an endoscopy. Please follow written instructions given to you at your visit today.  If you use inhalers (even only as needed), please bring them with you on the day of your procedure.  If you take any of the following medications, they will need to be adjusted prior to your procedure:   DO NOT TAKE 7 DAYS PRIOR TO TEST- Trulicity  (dulaglutide ) Ozempic, Wegovy (semaglutide) Mounjaro (tirzepatide) Bydureon  Bcise (exanatide extended release)  DO NOT TAKE 1 DAY PRIOR TO YOUR TEST Rybelsus (semaglutide) Adlyxin (lixisenatide) Victoza (liraglutide) Byetta (exanatide) ___________________________________________________________________________  Thank you for entrusting me with your care and for choosing [employer] - [facility].  _______________________________________________________  If your blood pressure at your visit was 140/90 or greater, please contact your primary care physician to follow up on this.  _______________________________________________________  If you are age 70 or older, your body mass index should be between 23-30. Your Body mass index is 34.22 kg/m. If this is out of the aforementioned range listed, please consider follow up with your Primary Care Provider.  If you are age 15 or younger, your body mass index should be between 19-25. Your Body mass index is 34.22 kg/m. If this is out of the aformentioned range listed, please consider follow up with your Primary Care Provider.   ________________________________________________________  The [facility] providers would like to encourage you to use MYCHART to communicate with providers for non-urgent requests or questions.  Due to long hold times on the telephone, sending your provider a message by [facility] may be a faster and more efficient way to get a response.  Please allow 48 business hours for a response.  Please remember that this is for non-urgent requests.   _______________________________________________________  [facility] is using a team-based approach to care.  Your team is made up of your doctor and two to three APPS. Our APPS (Nurse Practitioners and Physician Assistants) work with your physician to ensure care continuity for you. They are fully qualified to address your health concerns and develop a treatment plan. They communicate directly with your gastroenterologist to care for you. Seeing the Advanced Practice Practitioners on your physician's team can help you by facilitating care more promptly, often allowing for earlier appointments, access to diagnostic testing, procedures, and other specialty referrals.

## 2024-03-01 NOTE — Progress Notes (Signed)
 03/01/2024 MCKINNA DEMARS [id number] [birth year]   HISTORY OF PRESENT ILLNESS: This is a 78 year old female who is a patient of Dr. Darilyn.  She has not been seen here since January 2016 at the time of her last colonoscopy.  She was referred over here on this occasion by cardiology for evaluation of chest discomfort related to GERD.  She said they did an EKG and a stress test and everything turned out good, noncardiac related.  She describes that for the past year or so she gets burning and hurting in her chest.  She says that food gets hung up and then she will burps the food contents will come back up.  Has gotten progressively worse.  No issues with swallowing liquids alone.  She has longstanding acid reflux and has been on omeprazole  40 mg daily for years.  Has also been on famotidine  20 mg at bedtime for 2 or 3 years as well.   Past Medical History:  Diagnosis Date   Anxiety    Arthritis    CAD (coronary artery disease) 10/30/2023   LHC 09/07/20: mid LAD 10-20%, otherwise no significant CAD    Chronic back pain    buldging disc,scoliosis,arthritis   Chronic kidney disease    CKD3a   Claustrophobia    Congestive heart failure with left ventricular systolic dysfunction (HCC) 08/24/2020   Diabetes mellitus without complication (HCC)    takes Trulicity ,Jardiance,and Metformin  daily.Average fasting blood sugar runs around130   Dysrhythmia    PVCs, has MVP   Fibromyalgia    GERD (gastroesophageal reflux disease)    takes Omeprazole  daily   Gout 07/19/2021   On allopurinol    History of bronchitis > 8 yrs ago   History of shingles    HTN (hypertension)    takes Amlodipine  and Micardis daily   Hx of colonic polyps    benign   Internal and external hemorrhoids without complication    Joint pain    Mitral valve prolapse    Neuromuscular disorder (HCC)    restless leg syndrome   Nocturia    Pneumonia    PONV (postoperative nausea and vomiting)    when gets injections in  joints gets hives.Betadine rash   Restless leg syndrome    takes Requip  at bedtime   Seasonal allergies    takes Claritin daily as needed   Urinary frequency    Uterine fibroid    Past Surgical History:  Procedure Laterality Date   BUNIONECTOMY Bilateral    COLONOSCOPY  07/12/2004   diverticulosis, internal and external hemorrhoids   ETHMOIDECTOMY Bilateral 08/15/2022   Procedure: ETHMOIDECTOMY WITH TISSUE REMOVAL;  Surgeon: Karis Clunes, MD;  Location: [facility];  Service: ENT;  Laterality: Bilateral;   FINGER ARTHROSCOPY WITH CARPOMETACARPEL (CMC) ARTHROPLASTY Right 09/28/2015   Procedure: RIGHT THUMB TRAPEZIUM EXCISION WITH CARPOMETACARPEL (CMC) ARTHROPLASTY AND TENDON TRANSFER;  Surgeon: Prentice Pagan, MD;  [facility];  Service: Orthopedics;  Laterality: Right;   FRONTAL SINUS EXPLORATION Bilateral 08/15/2022   Procedure: FRONTAL RECESS SINUS EXPLORATION WITH NAVIGATION;  Surgeon: Karis Clunes, MD;  Location: [facility];  Service: ENT;  Laterality: Bilateral;   HEMORRHOID SURGERY     almost 40 yrs ago   HYSTEROSCOPY WITH D & C  08/23/2000   and resectoscopic myomectomy   KNEE CLOSED REDUCTION Left 06/05/2023   Procedure: CLOSED MANIPULATION KNEE;  Surgeon: Melodi Lerner, MD;  Location: [facility];  Service: Orthopedics;  Laterality: Left;   LUMBAR DISC  SURGERY  03/16/2005   LUMBAR EPIDURAL INJECTION     MASS EXCISION Right 08/08/2023   Procedure: EXCISION MASS RIGHT BUTTOCKS;  Surgeon: Evonnie Dorothyann LABOR, DO;  Location: [facility];  Service: General;  Laterality: Right;   MAXILLARY ANTROSTOMY Bilateral 08/15/2022   Procedure: ENDOSCOPIC MAXILLARY ANTROSTOMY WITH TISSUE REMOVAL;  Surgeon: Karis Clunes, MD;  Location: [facility];  Service: ENT;  Laterality: Bilateral;   PLANTAR FASCIA RELEASE Left 02/03/2010   and torn tendon   REVERSE SHOULDER ARTHROPLASTY Right 12/14/2020   Procedure: REVERSE SHOULDER ARTHROPLASTY;  Surgeon: Kay Kemps, MD;   Location: [facility];  Service: Orthopedics;  Laterality: Right;   RIGHT/LEFT HEART CATH AND CORONARY ANGIOGRAPHY N/A 09/07/2020   Procedure: RIGHT/LEFT HEART CATH AND CORONARY ANGIOGRAPHY;  Surgeon: Mady Bruckner, MD;  [facility];  Service: Cardiovascular;  Laterality: N/A;   SINUS ENDO W/FUSION Bilateral 08/15/2022   Procedure: SPHENOIDECTOMY WITH TISSUE REMOVAL WITH NAVIGATION;  Surgeon: Karis Clunes, MD;  Location: [facility];  Service: ENT;  Laterality: Bilateral;   TENDON TRANSFER Right 09/28/2015   Procedure: TENDON TRANSFER;  Surgeon: Prentice Pagan, MD;  [facility];  Service: Orthopedics;  Laterality: Right;   TONSILLECTOMY     TOTAL KNEE ARTHROPLASTY Left 04/24/2023   Procedure: TOTAL KNEE ARTHROPLASTY;  Surgeon: Melodi Lerner, MD;  Location: [facility];  Service: Orthopedics;  Laterality: Left;   TUBAL LIGATION     TURBINATE REDUCTION Bilateral 08/15/2022   Procedure: TURBINATE REDUCTION;  Surgeon: Karis Clunes, MD;  Location: [facility];  Service: ENT;  Laterality: Bilateral;   WRIST SURGERY     left, removal of cyst    reports that she has never smoked. She has never used smokeless tobacco. She reports that she does not drink alcohol and does not use drugs. family history includes Diabetes in her sister; Heart attack in her maternal grandfather and paternal grandfather; Lung cancer in her sister; Pancreatic cancer in her father; Uterine cancer in her sister. Allergies  Allergen Reactions   Amlodipine  Swelling   Amoxicillin -Pot Clavulanate Hives   Depo-Medrol  [Methylprednisolone ] Hives   Gabapentin Rash    hives    Levothyroxine Other (See Comments)    severe muscle pain in neck     Sitagliptin Other (See Comments)    Urinary hesitancy Januvia   Amoxicillin  Hives   Codeine Nausea And Vomiting   Corticosteroids Hives    Steroid injections   Tetracyclines & Related Nausea And Vomiting   Ace Inhibitors     cough   Dexamethasone   Rash      Outpatient Encounter Medications as of 03/01/2024  Medication Sig   allopurinol  (ZYLOPRIM ) 100 MG tablet TAKE 1 TABLET BY MOUTH EVERY DAY   Calcium -Magnesium -Zinc  (CAL-MAG-ZINC  PO) Take 1 tablet by mouth in the morning.   Carboxymeth-Glyc-Polysorb PF (REFRESH OPTIVE MEGA-3) 0.5-1-0.5 % SOLN Place 1-2 drops into both eyes 3 (three) times daily as needed (dry/irritated eyes.).   carvedilol  (COREG ) 3.125 MG tablet Take 3.125 mg by mouth 2 (two) times daily with a meal.   cholecalciferol  (VITAMIN D ) 1000 UNITS tablet Take 1,000 Units by mouth in the morning.   cyanocobalamin (VITAMIN B12) 1000 MCG tablet Take 1,000 mcg by mouth in the morning.   dapagliflozin  propanediol (FARXIGA ) 10 MG TABS tablet Take 1 tablet (10 mg total) by mouth daily before breakfast.   ENTRESTO  49-51 MG Take 1 tablet by mouth 2 (two) times daily.   famotidine  (PEPCID ) 20 MG tablet Take 20  mg by mouth at bedtime.   fluticasone  (FLONASE ) 50 MCG/ACT nasal spray INSTILL 1 SPRAY INTO BOTH NOSTRILS DAILY   furosemide  (LASIX ) 20 MG tablet Take 1 tablet (20 mg total) by mouth as directed. Monday,wed, Friday and sat only.   hydrALAZINE  (APRESOLINE ) 10 MG tablet Take 1 tablet (10 mg total) by mouth 3 (three) times daily.   ibuprofen (ADVIL) 200 MG tablet Take 600 mg by mouth daily as needed (pain.).   insulin  glargine (LANTUS  SOLOSTAR) 100 UNIT/ML Solostar Pen Inject 25 Units into the skin daily.   Insulin  Pen Needle (ASSURE ID SAFETY PEN NEEDLES) 30G X 5 MM MISC USE AS DIRECTED   ketorolac (ACULAR) 0.5 % ophthalmic solution Place 1 drop into the right eye 4 (four) times daily.   metFORMIN  (GLUCOPHAGE ) 500 MG tablet TAKE 1 TABLET BY MOUTH 2 TIMES DAILY WITH A MEAL.   montelukast  (SINGULAIR ) 10 MG tablet TAKE 1 TABLET BY MOUTH EVERY DAY   moxifloxacin (VIGAMOX) 0.5 % ophthalmic solution Place 1 drop into the left eye 4 (four) times daily.   Multiple Vitamin (MULTIVITAMIN WITH MINERALS) TABS tablet Take 1 tablet by mouth in  the morning.   omeprazole  (PRILOSEC ) 40 MG capsule TAKE 1 CAPSULE (40 MG TOTAL) BY MOUTH DAILY.   prednisoLONE acetate (PRED FORTE) 1 % ophthalmic suspension Place 1 drop into the right eye 4 (four) times daily.   pregabalin  (LYRICA ) 100 MG capsule Take 1 capsule by mouth twice daily   rOPINIRole  (REQUIP ) 3 MG tablet TAKE 1 TABLET BY MOUTH EVERYDAY AT BEDTIME   rosuvastatin  (CRESTOR ) 20 MG tablet TAKE 1 TABLET BY MOUTH EVERY DAY   spironolactone  (ALDACTONE ) 25 MG tablet Take 0.5 tablets (12.5 mg total) by mouth daily.   No facility-administered encounter medications on file as of 03/01/2024.     REVIEW OF SYSTEMS  : All other systems reviewed and negative except where noted in the History of Present Illness.   PHYSICAL EXAM: BP 136/78 (BP Location: Right Arm, Patient Position: Sitting, Cuff Size: Normal)   Pulse 65   Ht 5' 4 (1.626 m)   Wt 199 lb 6 oz (90.4 kg)   BMI 34.22 kg/m  General: Well developed white female in no acute distress Head: Normocephalic and atraumatic Eyes:  Sclerae anicteric, conjunctiva pink. Ears: Normal auditory acuity Lungs: Clear throughout to auscultation; no W/R/R. Heart: Regular rate and rhythm; no M/R/G. Musculoskeletal: Symmetrical with no gross deformities  Skin: No lesions on visible extremities Neurological: Alert oriented x 4, grossly non-focal Psychological:  Alert and cooperative. Normal mood and affect  ASSESSMENT AND PLAN: *78 year old female with longstanding GERD on omeprazole  and famotidine .  Now with complaints of worsening dysphagia to solid food over the past year.  Never had an EGD in the past.  Will plan for EGD with possible dilation with Dr. Avram.  The risks, benefits, and alternatives to EGD with dilation were discussed with the patient and she consents to proceed.   CC:  Lelon Hamilton T, PA-C

## 2024-03-06 NOTE — Progress Notes (Unsigned)
 Cardiology Office Note:    Date:  03/07/2024   ID:  [name], DOB 1945/12/22, MRN [id number]  PCP:  Jodie Lavern CROME, MD   [facility]  Cardiologist:  Vina Gull, MD    History of Present Illness:    [name] is a 78 y.o. 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   Diuresed.   LHC after d/c in Feb 2022 showed no signifcant CAD. March 2022 TTE showed LVEF 30 to 35%   Monitor showed frequent PVCs and several runs NSVT     June 2022   Echo showed LVEF 45%   RVEF normal    June 2023 Echo showed LVEF 45 to 50%  The pt was previously followed by VEAR Sorrow I saw the pt in clinic in Oct 2024   She was seen by GORMAN Ferrier in May 2025   Note that she did not tolerate higher dose of Entresto  due to cough   With high BP she was started on hydralazine  10 mg tid    Since seen the pt denies CP   Her breathing is OK   She denies LE swelling     No dizziness     Last A1C was 7.3   Admits to eating carbs  Past Medical History:  Diagnosis Date   Anxiety    Arthritis    CAD (coronary artery disease) 10/30/2023   LHC 09/07/20: mid LAD 10-20%, otherwise no significant CAD    Chronic back pain    buldging disc,scoliosis,arthritis   Chronic kidney disease    CKD3a   Claustrophobia    Congestive heart failure with left ventricular systolic dysfunction (HCC) 08/24/2020   Diabetes mellitus without complication (HCC)    takes Trulicity ,Jardiance,and Metformin  daily.Average fasting blood sugar runs around130   Dysrhythmia    PVCs, has MVP   Fibromyalgia    GERD (gastroesophageal reflux disease)    takes Omeprazole  daily   Gout 07/19/2021   On allopurinol    History of bronchitis > 8 yrs ago   History of shingles    HTN (hypertension)    takes Amlodipine  and Micardis daily    Hx of colonic polyps    benign   Internal and external hemorrhoids without complication    Joint pain    Mitral valve prolapse    Neuromuscular disorder (HCC)    restless leg syndrome   Nocturia    Pneumonia    PONV (postoperative nausea and vomiting)    when gets injections in joints gets hives.Betadine rash   Restless leg syndrome    takes Requip  at bedtime   Seasonal allergies    takes Claritin daily as needed   Urinary frequency    Uterine fibroid     Past Surgical History:  Procedure Laterality Date   BUNIONECTOMY Bilateral    COLONOSCOPY  07/12/2004   diverticulosis, internal and external hemorrhoids   ETHMOIDECTOMY Bilateral 08/15/2022   Procedure: ETHMOIDECTOMY WITH TISSUE REMOVAL;  Surgeon: Karis Clunes, MD;  Location: [facility];  Service: ENT;  Laterality: Bilateral;   FINGER ARTHROSCOPY WITH CARPOMETACARPEL (CMC) ARTHROPLASTY Right 09/28/2015   Procedure: RIGHT THUMB TRAPEZIUM EXCISION WITH CARPOMETACARPEL (CMC) ARTHROPLASTY AND TENDON TRANSFER;  Surgeon: Prentice Pagan, MD;  [facility];  Service:  Orthopedics;  Laterality: Right;   FRONTAL SINUS EXPLORATION Bilateral 08/15/2022   Procedure: FRONTAL RECESS SINUS EXPLORATION WITH NAVIGATION;  Surgeon: Karis Clunes, MD;  Location: [facility];  Service: ENT;  Laterality: Bilateral;   HEMORRHOID SURGERY     almost 40 yrs ago   HYSTEROSCOPY WITH D & C  08/23/2000   and resectoscopic myomectomy   KNEE CLOSED REDUCTION Left 06/05/2023   Procedure: CLOSED MANIPULATION KNEE;  Surgeon: Melodi Lerner, MD;  Location: [facility];  Service: Orthopedics;  Laterality: Left;   LUMBAR DISC SURGERY  03/16/2005   LUMBAR EPIDURAL INJECTION     MASS EXCISION Right 08/08/2023   Procedure: EXCISION MASS RIGHT BUTTOCKS;  Surgeon: Evonnie Dorothyann LABOR, DO;  Location: [facility];  Service: General;  Laterality: Right;   MAXILLARY ANTROSTOMY Bilateral 08/15/2022   Procedure: ENDOSCOPIC MAXILLARY ANTROSTOMY WITH TISSUE  REMOVAL;  Surgeon: Karis Clunes, MD;  Location: [facility];  Service: ENT;  Laterality: Bilateral;   PLANTAR FASCIA RELEASE Left 02/03/2010   and torn tendon   REVERSE SHOULDER ARTHROPLASTY Right 12/14/2020   Procedure: REVERSE SHOULDER ARTHROPLASTY;  Surgeon: Kay Kemps, MD;  Location: [facility];  Service: Orthopedics;  Laterality: Right;   RIGHT/LEFT HEART CATH AND CORONARY ANGIOGRAPHY N/A 09/07/2020   Procedure: RIGHT/LEFT HEART CATH AND CORONARY ANGIOGRAPHY;  Surgeon: Mady Bruckner, MD;  [facility];  Service: Cardiovascular;  Laterality: N/A;   SINUS ENDO W/FUSION Bilateral 08/15/2022   Procedure: SPHENOIDECTOMY WITH TISSUE REMOVAL WITH NAVIGATION;  Surgeon: Karis Clunes, MD;  Location: [facility];  Service: ENT;  Laterality: Bilateral;   TENDON TRANSFER Right 09/28/2015   Procedure: TENDON TRANSFER;  Surgeon: Prentice Pagan, MD;  [facility];  Service: Orthopedics;  Laterality: Right;   TONSILLECTOMY     TOTAL KNEE ARTHROPLASTY Left 04/24/2023   Procedure: TOTAL KNEE ARTHROPLASTY;  Surgeon: Melodi Lerner, MD;  Location: [facility];  Service: Orthopedics;  Laterality: Left;   TUBAL LIGATION     TURBINATE REDUCTION Bilateral 08/15/2022   Procedure: TURBINATE REDUCTION;  Surgeon: Karis Clunes, MD;  [facility];  Service: ENT;  Laterality: Bilateral;   WRIST SURGERY     left, removal of cyst    Current Medications: Current Meds  Medication Sig   allopurinol  (ZYLOPRIM ) 100 MG tablet TAKE 1 TABLET BY MOUTH EVERY DAY   Calcium -Magnesium -Zinc  (CAL-MAG-ZINC  PO) Take 1 tablet by mouth in the morning.   Carboxymeth-Glyc-Polysorb PF (REFRESH OPTIVE MEGA-3) 0.5-1-0.5 % SOLN Place 1-2 drops into both eyes 3 (three) times daily as needed (dry/irritated eyes.).   carvedilol  (COREG ) 3.125 MG tablet Take 3.125 mg by mouth 2 (two) times daily with a meal.   cholecalciferol  (VITAMIN D ) 1000 UNITS tablet Take 1,000 Units by mouth in the  morning.   cyanocobalamin (VITAMIN B12) 1000 MCG tablet Take 1,000 mcg by mouth in the morning.   dapagliflozin  propanediol (FARXIGA ) 10 MG TABS tablet Take 1 tablet (10 mg total) by mouth daily before breakfast.   ENTRESTO  49-51 MG Take 1 tablet by mouth 2 (two) times daily.   famotidine  (PEPCID ) 20 MG tablet Take 20 mg by mouth at bedtime.   fluticasone  (FLONASE ) 50 MCG/ACT nasal spray INSTILL 1 SPRAY INTO BOTH NOSTRILS DAILY   furosemide  (LASIX ) 20 MG tablet Take 1 tablet (20 mg total) by mouth as directed. Monday,wed, Friday and sat only.   hydrALAZINE  (APRESOLINE ) 10 MG tablet Take 1 tablet (10 mg total) by mouth 3 (three) times daily.  ibuprofen (ADVIL) 200 MG tablet Take 600 mg by mouth daily as needed (pain.).   insulin  glargine (LANTUS  SOLOSTAR) 100 UNIT/ML Solostar Pen Inject 25 Units into the skin daily.   Insulin  Pen Needle (ASSURE ID SAFETY PEN NEEDLES) 30G X 5 MM MISC USE AS DIRECTED   ketorolac (ACULAR) 0.5 % ophthalmic solution Place 1 drop into the right eye 4 (four) times daily.   metFORMIN  (GLUCOPHAGE ) 500 MG tablet TAKE 1 TABLET BY MOUTH 2 TIMES DAILY WITH A MEAL.   montelukast  (SINGULAIR ) 10 MG tablet TAKE 1 TABLET BY MOUTH EVERY DAY   moxifloxacin (VIGAMOX) 0.5 % ophthalmic solution Place 1 drop into the left eye 4 (four) times daily.   Multiple Vitamin (MULTIVITAMIN WITH MINERALS) TABS tablet Take 1 tablet by mouth in the morning.   omeprazole  (PRILOSEC ) 40 MG capsule TAKE 1 CAPSULE (40 MG TOTAL) BY MOUTH DAILY.   prednisoLONE acetate (PRED FORTE) 1 % ophthalmic suspension Place 1 drop into the right eye 4 (four) times daily.   pregabalin  (LYRICA ) 100 MG capsule Take 1 capsule by mouth twice daily   rOPINIRole  (REQUIP ) 3 MG tablet TAKE 1 TABLET BY MOUTH EVERYDAY AT BEDTIME   rosuvastatin  (CRESTOR ) 20 MG tablet TAKE 1 TABLET BY MOUTH EVERY DAY   spironolactone  (ALDACTONE ) 25 MG tablet Take 0.5 tablets (12.5 mg total) by mouth daily.     Allergies:   Amlodipine ,  Amoxicillin -pot clavulanate, Depo-medrol  [methylprednisolone ], Gabapentin, Levothyroxine, Sitagliptin, Amoxicillin , Codeine, Corticosteroids, Tetracyclines & related, Ace inhibitors, and Dexamethasone    Social History   Socioeconomic History   Marital status: Married    Spouse name: Not on file   Number of children: 3   Years of education: Not on file   Highest education level: Associate degree: occupational, Scientist, product/process development, or vocational program  Occupational History   Occupation: Retired   Tobacco Use   Smoking status: Never   Smokeless tobacco: Never  Vaping Use   Vaping status: Never Used  Substance and Sexual Activity   Alcohol use: No    Alcohol/week: 0.0 standard drinks of alcohol   Drug use: No   Sexual activity: Not Currently  Other Topics Concern   Not on file  Social History Narrative   1 caffeine drinks daily         Social Drivers of Health   Financial Resource Strain: Patient Declined (10/04/2023)   Overall Financial Resource Strain (CARDIA)    Difficulty of Paying Living Expenses: Patient declined  Food Insecurity: Patient Declined (10/04/2023)   Hunger Vital Sign    Worried About Running Out of Food in the Last Year: Patient declined    Ran Out of Food in the Last Year: Patient declined  Transportation Needs: Unknown (10/04/2023)   PRAPARE - Transportation    Lack of Transportation (Medical): No    Lack of Transportation (Non-Medical): Patient declined  Physical Activity: Unknown (10/04/2023)   Exercise Vital Sign    Days of Exercise per Week: Patient declined    Minutes of Exercise per Session: 40 min  Stress: Patient Declined (10/04/2023)   [employer] of Occupational Health - Occupational Stress Questionnaire    Feeling of Stress : Patient declined  Social Connections: Unknown (10/04/2023)   Social Connection and Isolation Panel    Frequency of Communication with Friends and Family: Patient declined    Frequency of Social Gatherings with Friends and Family:  Patient declined    Attends Religious Services: Patient declined    Active Member of Clubs or Organizations: Patient declined  Attends Banker Meetings: More than 4 times per year    Marital Status: Patient declined     Family History: The patient's family history includes Diabetes in her sister; Heart attack in her maternal grandfather and paternal grandfather; Lung cancer in her sister; Pancreatic cancer in her father; Uterine cancer in her sister. There is no history of Colon cancer or Breast cancer.   EKGs/Labs/Other Studies Reviewed:    The following studies were reviewed today:  TTE 12/2021: IMPRESSIONS   1. Left ventricular ejection fraction, by estimation, is 45 to 50%. The  left ventricle has mildly decreased function. The left ventricle  demonstrates global hypokinesis. Left ventricular diastolic parameters are  consistent with Grade I diastolic  dysfunction (impaired relaxation).   2. Right ventricular systolic function is normal. The right ventricular  size is normal. Tricuspid regurgitation signal is inadequate for assessing  PA pressure.   3. The mitral valve is grossly normal. Trivial mitral valve  regurgitation. No evidence of mitral stenosis.   4. The aortic valve is tricuspid. Aortic valve regurgitation is not  visualized. No aortic stenosis is present.   5. The inferior vena cava is normal in size with greater than 50%  respiratory variability, suggesting right atrial pressure of 3 mmHg.   Comparison(s): No significant change from prior study.    Cardiac Monitor 10/09/20: Patient's monitoring period was from 09/08/20-10/07/20 Predominant rhythm was NSR with frequent PVCs. Average HR 84bpm (ranged from 59-84bpm) There were several runs of NSVT, however, no sustained VT (longest episode was 11 beats) Frequent ventricular ectopy was detected (10%; 314,307 beats) SVE was rare <1% No afib or significant pauses Patient triggered events correlated  with PVCs and NSVT.    RHC/LHC 09/07/20: Conclusions: Mild plaquing of the mid LAD with 10-20% stenosis.  Otherwise, no angiographically significant coronary artery disease. Normal left and right heart filling pressures. Borderline elevated pulmonary artery pressure. Normal Fick cardiac output/index.   Recommendations: Maintain net even fluid balance and optimize goal directed medical therapy for non-ischemic cardiomyopathy. Primary    EKG:  EKG is personally reviewed.  SR   Nonspecific ST changes    Recent Labs: 10/06/2023: ALT 13; Hemoglobin 11.6; Platelets 288.0; TSH 2.18 11/16/2023: BUN 24; Creatinine, Ser 1.34; Potassium 4.4; Sodium 142   Recent Lipid Panel    Component Value Date/Time   CHOL 119 10/06/2023 0942   TRIG 111.0 10/06/2023 0942   HDL 58.50 10/06/2023 0942   CHOLHDL 2 10/06/2023 0942   VLDL 22.2 10/06/2023 0942   LDLCALC 38 10/06/2023 0942      Physical Exam:    VS:  BP (!) 142/62   Pulse 70   Ht 5' 4 (1.626 m)   Wt 200 lb 12.8 oz (91.1 kg)   SpO2 98%   BMI 34.47 kg/m     Wt Readings from Last 3 Encounters:  03/07/24 200 lb 12.8 oz (91.1 kg)  03/01/24 199 lb 6 oz (90.4 kg)  01/05/24 199 lb (90.3 kg)     GEN:  OBese 78 yo in no acute distress HEENT: Normal NECK: No JVD; No carotid bruits CARDIAC: RRR,No murmur    RESPIRATORY:  Clear to auscultation bilaterally  ABDOMEN: Soft, non-tender, non-distended Ext   No LE edema    IMPRESSION  # HFrEF     Nonischemic    Diagnosed in the setting of COVID-19 pneumonia. Cath with mild, non-obstructive disease. Initial TTE with LVEF 20-25%-->30-35%-->45-50%.  Volume status is good    I would continue  current regimen, work to optimize BP control       #Non-obstructive CAD: Cath on 09/07/20 with mild plaquing of the mid LAD with 10-20% stenosis.   Pt denies CP Continue crestor  20mg  daily   #PVCs/NSVT: Cardiac monitor in 2022 with several runs of NSVT (longest 11 beats) and frequent (10%) burden of  PVCs. No sustained arrhythmias. Pt denies palpitations  -Continue coreg   3.125 mg BID   # LIPIDS Lipid panel in March 2025 LDL 38  HDL 58  Trig 111  #CKD: Cr 1.34  in April 2025    #DMII: -Continue farxiga  10mg  daily   Last A1C 7.3   Reviewed diet  Cut back on carbs      Plan for follow up later this winter  Medication Adjustments/Labs and Tests Ordered: Current medicines are reviewed at length with the patient today.  Concerns regarding medicines are outlined above.   No orders of the defined types were placed in this encounter.  Signed, Vina Gull, MD  03/07/2024 9:19 AM    [facility]

## 2024-03-07 ENCOUNTER — Encounter: Payer: Self-pay | Admitting: Internal Medicine

## 2024-03-07 ENCOUNTER — Ambulatory Visit: Attending: Internal Medicine | Admitting: Internal Medicine

## 2024-03-07 DIAGNOSIS — I428 Other cardiomyopathies: Secondary | ICD-10-CM | POA: Diagnosis present

## 2024-03-07 MED ORDER — HYDRALAZINE HCL 10 MG PO TABS: 20.0000 mg | ORAL_TABLET | Freq: Two times a day (BID) | ORAL | 3 refills | Status: AC

## 2024-03-07 MED ORDER — DAPAGLIFLOZIN PROPANEDIOL 10 MG PO TABS: 10.0000 mg | ORAL_TABLET | Freq: Every day | ORAL | 3 refills | Status: AC

## 2024-03-07 NOTE — Patient Instructions (Signed)
 Medication Instructions:  Your physician has recommended you make the following change in your medication:   1) INCREASE hydralazine  to 20 mg twice daily  *If you need a refill on your cardiac medications before your next appointment, please call your pharmacy*  Follow-Up: At [facility], you and your health needs are our priority.  As part of our continuing mission to provide you with exceptional heart care, our providers are all part of one team.  This team includes your primary Cardiologist (physician) and Advanced Practice Providers or APPs (Physician Assistants and Nurse Practitioners) who all work together to provide you with the care you need, when you need it.  Your next appointment:   6 month(s)  Provider:   Vina Gull, MD   We recommend signing up for the patient portal called MyChart.  Sign up information is provided on this After Visit Summary.  MyChart is used to connect with patients for Virtual Visits (Telemedicine).  Patients are able to view lab/test results, encounter notes, upcoming appointments, etc.  Non-urgent messages can be sent to your provider as well.   To learn more about what you can do with MyChart, go to [email or web address].

## 2024-03-23 ENCOUNTER — Other Ambulatory Visit (HOSPITAL_BASED_OUTPATIENT_CLINIC_OR_DEPARTMENT_OTHER): Payer: Self-pay | Admitting: Cardiology

## 2024-04-01 ENCOUNTER — Encounter: Payer: Self-pay | Admitting: Internal Medicine

## 2024-04-02 NOTE — Progress Notes (Unsigned)
 [facility] History and Physical   Primary Care Physician:  Jodie Lavern CROME, MD   Reason for Procedure:    Encounter Diagnoses  Name Primary?   Esophageal dysphagia Yes   Gastroesophageal reflux disease, unspecified whether esophagitis present      Plan:    EGD, esophageal dilation     HPI: [name] is a 78 y.o. female here for evaluation and treatment of dysphagia. Seen in clinic 03/01/2024 by Harlene Mail, PA-C.   Past Medical History:  Diagnosis Date   Anxiety    Arthritis    CAD (coronary artery disease) 10/30/2023   LHC 09/07/20: mid LAD 10-20%, otherwise no significant CAD    Chronic back pain    buldging disc,scoliosis,arthritis   Chronic kidney disease    CKD3a   Claustrophobia    Congestive heart failure with left ventricular systolic dysfunction (HCC) 08/24/2020   Diabetes mellitus without complication (HCC)    takes Trulicity ,Jardiance,and Metformin  daily.Average fasting blood sugar runs around130   Dysrhythmia    PVCs, has MVP   Fibromyalgia    GERD (gastroesophageal reflux disease)    takes Omeprazole  daily   Gout 07/19/2021   On allopurinol    History of bronchitis > 8 yrs ago   History of shingles    HTN (hypertension)    takes Amlodipine  and Micardis daily   Hx of colonic polyps    benign   Internal and external hemorrhoids without complication    Joint pain    Mitral valve prolapse    Nocturia    Pneumonia    PONV (postoperative nausea and vomiting)    when gets injections in joints gets hives.Betadine rash   Restless leg syndrome    takes Requip  at bedtime   Seasonal allergies    takes Claritin daily as needed   Urinary frequency    Uterine fibroid     Past Surgical History:  Procedure Laterality Date   BUNIONECTOMY Bilateral    COLONOSCOPY  07/12/2004   diverticulosis, internal and external hemorrhoids   ETHMOIDECTOMY Bilateral 08/15/2022   Procedure: ETHMOIDECTOMY WITH TISSUE REMOVAL;  Surgeon: Karis Clunes, MD;   Location: [facility];  Service: ENT;  Laterality: Bilateral;   FINGER ARTHROSCOPY WITH CARPOMETACARPEL (CMC) ARTHROPLASTY Right 09/28/2015   Procedure: RIGHT THUMB TRAPEZIUM EXCISION WITH CARPOMETACARPEL (CMC) ARTHROPLASTY AND TENDON TRANSFER;  Surgeon: Prentice Pagan, MD;  Location: [facility];  Service: Orthopedics;  Laterality: Right;   FRONTAL SINUS EXPLORATION Bilateral 08/15/2022   Procedure: FRONTAL RECESS SINUS EXPLORATION WITH NAVIGATION;  Surgeon: Karis Clunes, MD;  Location: [facility];  Service: ENT;  Laterality: Bilateral;   HEMORRHOID SURGERY     almost 40 yrs ago   HYSTEROSCOPY WITH D & C  08/23/2000   and resectoscopic myomectomy   KNEE CLOSED REDUCTION Left 06/05/2023   Procedure: CLOSED MANIPULATION KNEE;  Surgeon: Melodi Lerner, MD;  Location: [facility];  Service: Orthopedics;  Laterality: Left;   LUMBAR DISC SURGERY  03/16/2005   LUMBAR EPIDURAL INJECTION     MASS EXCISION Right 08/08/2023   Procedure: EXCISION MASS RIGHT BUTTOCKS;  Surgeon: Evonnie Dorothyann LABOR, DO;  Location: [facility];  Service: General;  Laterality: Right;   MAXILLARY ANTROSTOMY Bilateral 08/15/2022   Procedure: ENDOSCOPIC MAXILLARY ANTROSTOMY WITH TISSUE REMOVAL;  Surgeon: Karis Clunes, MD;  Location: [facility];  Service: ENT;  Laterality: Bilateral;   PLANTAR FASCIA RELEASE Left 02/03/2010   and torn tendon   REVERSE SHOULDER ARTHROPLASTY Right 12/14/2020  Procedure: REVERSE SHOULDER ARTHROPLASTY;  Surgeon: Kay Kemps, MD;  Location: [facility];  Service: Orthopedics;  Laterality: Right;   RIGHT/LEFT HEART CATH AND CORONARY ANGIOGRAPHY N/A 09/07/2020   Procedure: RIGHT/LEFT HEART CATH AND CORONARY ANGIOGRAPHY;  Surgeon: Mady Bruckner, MD;  Location: [facility];  Service: Cardiovascular;  Laterality: N/A;   SINUS ENDO W/FUSION Bilateral 08/15/2022   Procedure: SPHENOIDECTOMY WITH TISSUE REMOVAL WITH NAVIGATION;  Surgeon: Karis Clunes, MD;  Location: [facility];  Service: ENT;  Laterality: Bilateral;   TENDON TRANSFER Right 09/28/2015   Procedure: TENDON TRANSFER;  Surgeon: Prentice Pagan, MD;  Location: [facility];  Service: Orthopedics;  Laterality: Right;   TONSILLECTOMY     TOTAL KNEE ARTHROPLASTY Left 04/24/2023   Procedure: TOTAL KNEE ARTHROPLASTY;  Surgeon: Melodi Lerner, MD;  Location: [facility];  Service: Orthopedics;  Laterality: Left;   TUBAL LIGATION     TURBINATE REDUCTION Bilateral 08/15/2022   Procedure: TURBINATE REDUCTION;  Surgeon: Karis Clunes, MD;  Location: [facility];  Service: ENT;  Laterality: Bilateral;   WRIST SURGERY     left, removal of cyst     Current Outpatient Medications  Medication Sig Dispense Refill   allopurinol  (ZYLOPRIM ) 100 MG tablet TAKE 1 TABLET BY MOUTH EVERY DAY 90 tablet 3   Calcium -Magnesium -Zinc  (CAL-MAG-ZINC  PO) Take 1 tablet by mouth in the morning.     Carboxymeth-Glyc-Polysorb PF (REFRESH OPTIVE MEGA-3) 0.5-1-0.5 % SOLN Place 1-2 drops into both eyes 3 (three) times daily as needed (dry/irritated eyes.).     carvedilol  (COREG ) 3.125 MG tablet Take 3.125 mg by mouth 2 (two) times daily with a meal.     cholecalciferol  (VITAMIN D ) 1000 UNITS tablet Take 1,000 Units by mouth in the morning.     cyanocobalamin (VITAMIN B12) 1000 MCG tablet Take 1,000 mcg by mouth in the morning.     dapagliflozin  propanediol (FARXIGA ) 10 MG TABS tablet Take 1 tablet (10 mg total) by mouth daily before breakfast. 90 tablet 3   ENTRESTO  49-51 MG Take 1 tablet by mouth 2 (two) times daily.     famotidine  (PEPCID ) 20 MG tablet Take 20 mg by mouth at bedtime.     fluticasone  (FLONASE ) 50 MCG/ACT nasal spray INSTILL 1 SPRAY INTO BOTH NOSTRILS DAILY 16 mL 11   furosemide  (LASIX ) 20 MG tablet Take 1 tablet (20 mg total) by mouth as directed. Monday,wed, Friday and sat only. 16 tablet 11   hydrALAZINE  (APRESOLINE ) 10 MG tablet Take 2 tablets (20 mg total) by mouth 2 (two) times daily. 360 tablet 3   ibuprofen (ADVIL)  200 MG tablet Take 600 mg by mouth daily as needed (pain.).     insulin  glargine (LANTUS  SOLOSTAR) 100 UNIT/ML Solostar Pen Inject 25 Units into the skin daily. 3 mL 2   Insulin  Pen Needle (ASSURE ID SAFETY PEN NEEDLES) 30G X 5 MM MISC USE AS DIRECTED 100 each 1   ketorolac (ACULAR) 0.5 % ophthalmic solution Place 1 drop into the right eye 4 (four) times daily.     metFORMIN  (GLUCOPHAGE ) 500 MG tablet TAKE 1 TABLET BY MOUTH 2 TIMES DAILY WITH A MEAL. 180 tablet 3   montelukast  (SINGULAIR ) 10 MG tablet TAKE 1 TABLET BY MOUTH EVERY DAY 90 tablet 1   moxifloxacin (VIGAMOX) 0.5 % ophthalmic solution Place 1 drop into the left eye 4 (four) times daily.     Multiple Vitamin (MULTIVITAMIN WITH MINERALS) TABS tablet Take 1 tablet by mouth in the morning.  omeprazole  (PRILOSEC ) 40 MG capsule TAKE 1 CAPSULE (40 MG TOTAL) BY MOUTH DAILY. 90 capsule 3   prednisoLONE acetate (PRED FORTE) 1 % ophthalmic suspension Place 1 drop into the right eye 4 (four) times daily.     pregabalin  (LYRICA ) 100 MG capsule Take 1 capsule by mouth twice daily 180 capsule 3   rOPINIRole  (REQUIP ) 3 MG tablet TAKE 1 TABLET BY MOUTH EVERYDAY AT BEDTIME 90 tablet 3   rosuvastatin  (CRESTOR ) 20 MG tablet TAKE 1 TABLET BY MOUTH EVERY DAY 90 tablet 3   spironolactone  (ALDACTONE ) 25 MG tablet TAKE 1/2 TABLET BY MOUTH EVERY DAY 45 tablet 3   No current facility-administered medications for this visit.    Allergies as of 04/03/2024 - Review Complete 03/07/2024  Allergen Reaction Noted   Amlodipine  Swelling 09/12/2017   Amoxicillin -pot clavulanate Hives 10/04/2022   Depo-medrol  [methylprednisolone ] Hives 03/16/2020   Gabapentin Rash 08/11/2016   Levothyroxine Other (See Comments) 10/16/2017   Sitagliptin Other (See Comments) 09/16/2015   Amoxicillin  Hives 03/31/2023   Codeine Nausea And Vomiting 05/11/2011   Corticosteroids Hives 03/31/2023   Tetracyclines & related Nausea And Vomiting 05/11/2011   Ace inhibitors  07/30/2014    Dexamethasone  Rash 08/05/2020    Family History  Problem Relation Age of Onset   Pancreatic cancer Father    Lung cancer Sister    Uterine cancer Sister    Diabetes Sister    Heart attack Maternal Grandfather    Heart attack Paternal Grandfather    Colon cancer Neg Hx    Breast cancer Neg Hx     Social History   Socioeconomic History   Marital status: Married    Spouse name: Not on file   Number of children: 3   Years of education: Not on file   Highest education level: Associate degree: occupational, Scientist, product/process development, or vocational program  Occupational History   Occupation: Retired   Tobacco Use   Smoking status: Never   Smokeless tobacco: Never  Vaping Use   Vaping status: Never Used  Substance and Sexual Activity   Alcohol use: No    Alcohol/week: 0.0 standard drinks of alcohol   Drug use: No   Sexual activity: Not Currently  Other Topics Concern   Not on file  Social History Narrative   1 caffeine drinks daily         Social Drivers of Health   Financial Resource Strain: Patient Declined (10/04/2023)   Overall Financial Resource Strain (CARDIA)    Difficulty of Paying Living Expenses: Patient declined  Food Insecurity: Patient Declined (10/04/2023)   Hunger Vital Sign    Worried About Running Out of Food in the Last Year: Patient declined    Ran Out of Food in the Last Year: Patient declined  Transportation Needs: Unknown (10/04/2023)   PRAPARE - Transportation    Lack of Transportation (Medical): No    Lack of Transportation (Non-Medical): Patient declined  Physical Activity: Unknown (10/04/2023)   Exercise Vital Sign    Days of Exercise per Week: Patient declined    Minutes of Exercise per Session: 40 min  Stress: Patient Declined (10/04/2023)   [employer] of Occupational Health - Occupational Stress Questionnaire    Feeling of Stress : Patient declined  Social Connections: Unknown (10/04/2023)   Social Connection and Isolation Panel    Frequency of  Communication with Friends and Family: Patient declined    Frequency of Social Gatherings with Friends and Family: Patient declined    Attends Religious Services: Patient declined  Active Member of Clubs or Organizations: Patient declined    Attends Banker Meetings: More than 4 times per year    Marital Status: Patient declined  Intimate Partner Violence: Not At Risk (05/09/2023)   Humiliation, Afraid, Rape, and Kick questionnaire    Fear of Current or Ex-Partner: No    Emotionally Abused: No    Physically Abused: No    Sexually Abused: No    Review of Systems: Positive for *** All other review of systems negative except as mentioned in the HPI.  Physical Exam: Vital signs There were no vitals taken for this visit.  General:   Alert,  Well-developed, well-nourished, pleasant and cooperative in NAD Lungs:  Clear throughout to auscultation.   Heart:  Regular rate and rhythm; no murmurs, clicks, rubs,  or gallops. Abdomen:  Soft, nontender and nondistended. Normal bowel sounds.   Neuro/Psych:  Alert and cooperative. Normal mood and affect. A and O x 3   @Turner Kunzman  CHARLENA Commander, MD, [facility] [phone] (pager) 04/02/2024 9:03 PM@

## 2024-04-03 ENCOUNTER — Ambulatory Visit: Admitting: Internal Medicine

## 2024-04-03 ENCOUNTER — Encounter: Payer: Self-pay | Admitting: Internal Medicine

## 2024-04-03 VITALS — BP 133/69 | HR 73 | Temp 97.4°F | Resp 18 | Ht 64.0 in | Wt 200.1 lb

## 2024-04-03 DIAGNOSIS — R1310 Dysphagia, unspecified: Secondary | ICD-10-CM | POA: Diagnosis not present

## 2024-04-03 DIAGNOSIS — K3189 Other diseases of stomach and duodenum: Secondary | ICD-10-CM | POA: Diagnosis not present

## 2024-04-03 DIAGNOSIS — R1319 Other dysphagia: Secondary | ICD-10-CM

## 2024-04-03 DIAGNOSIS — K219 Gastro-esophageal reflux disease without esophagitis: Secondary | ICD-10-CM

## 2024-04-03 DIAGNOSIS — K317 Polyp of stomach and duodenum: Secondary | ICD-10-CM | POA: Diagnosis not present

## 2024-04-03 DIAGNOSIS — Q399 Congenital malformation of esophagus, unspecified: Secondary | ICD-10-CM | POA: Diagnosis not present

## 2024-04-03 MED ORDER — SODIUM CHLORIDE 0.9 % IV SOLN: 500.0000 mL | INTRAVENOUS | Status: DC

## 2024-04-03 NOTE — Progress Notes (Signed)
 Called to room to assist during endoscopic procedure.  Patient ID and intended procedure confirmed with present staff. Received instructions for my participation in the procedure from the performing physician.

## 2024-04-03 NOTE — Progress Notes (Signed)
1021 Robinul 0.1 mg IV given due large amount of secretions upon assessment.  MD made aware, vss 

## 2024-04-03 NOTE — Progress Notes (Signed)
 Report given to PACU, vss

## 2024-04-03 NOTE — Op Note (Signed)
 [facility] Patient Name: [name] Procedure Date: 04/03/2024 10:15 AM MRN: [id number] Endoscopist: Lupita FORBES Commander , MD, [id number] Age: 78 Referring MD:  Date of Birth: May 26, 1946 Gender: Female Account #: [id number] Procedure:                Upper GI endoscopy Indications:              Dysphagia, Gastro-esophageal reflux disease -                            cardiology thought chest pain related to this, has                            regurgitation issues at times also. Medicines:                Monitored Anesthesia Care Procedure:                Pre-Anesthesia Assessment:                           - Prior to the procedure, a History and Physical                            was performed, and patient medications and                            allergies were reviewed. The patient's tolerance of                            previous anesthesia was also reviewed. The risks                            and benefits of the procedure and the sedation                            options and risks were discussed with the patient.                            All questions were answered, and informed consent                            was obtained. Prior Anticoagulants: The patient has                            taken no anticoagulant or antiplatelet agents. ASA                            Grade Assessment: III - A patient with severe                            systemic disease. After reviewing the risks and                            benefits, the patient was deemed in satisfactory  condition to undergo the procedure.                           After obtaining informed consent, the endoscope was                            passed under direct vision. Throughout the                            procedure, the patient's blood pressure, pulse, and                            oxygen saturations were monitored continuously. The                            GIF [id number]  #[id number] was introduced through the                            mouth, and advanced to the second part of duodenum.                            The upper GI endoscopy was accomplished without                            difficulty. The patient tolerated the procedure                            well. Scope In: Scope Out: Findings:                 The examined esophagus was mildly tortuous. The                            scope was withdrawn. Dilation was performed with a                            Maloney dilator with mild resistance at 54 Fr. The                            dilation site was examined following endoscope                            reinsertion and showed no change. Estimated blood                            loss: none.                           Multiple 1 to 8 mm sessile and flat polyps were                            found in the gastric fundus and in the gastric                            body. Biopsies  were taken with a cold forceps for                            histology. Verification of patient identification                            for the specimen was done. Estimated blood loss was                            minimal.                           Patchy moderately erythematous mucosa without                            bleeding was found in the gastric antrum. Biopsies                            were taken with a cold forceps for histology.                            Verification of patient identification for the                            specimen was done. Estimated blood loss was minimal.                           The cardia and gastric fundus were otherwise normal                            on retroflexion.                           The exam was otherwise without abnormality.                           The gastroesophageal flap valve was visualized                            endoscopically and classified as Hill Grade I                            (prominent fold, tight  to endoscope). Complications:            No immediate complications. Estimated Blood Loss:     Estimated blood loss was minimal. Impression:               - Tortuous esophagus. ? dysmotility Dilated. 54 Fr                            maloney                           - Multiple gastric polyps. Biopsied. Jar 1 body                            (  sessile polyps), jar 2 fundus (flat polyps)                           - Erythematous mucosa in the antrum. Biopsied. R/O                            gastritis, H. pylori                           - The examination was otherwise normal.                           - Gastroesophageal flap valve classified as Hill                            Grade I (prominent fold, tight to endoscope). Recommendation:           - Patient has a contact number available for                            emergencies. The signs and symptoms of potential                            delayed complications were discussed with the                            patient. Return to normal activities tomorrow.                            Written discharge instructions were provided to the                            patient.                           - Clear liquids x 1 hour then soft foods rest of                            day. Start prior diet tomorrow and use GERD                            prevention diet. Also cut food small pieces, chew                            well and follow solid food with liquid.                           - Continue present medications. On omeprazole  40 mg                            qd and Pepcid  20 mg qHS                           - Await pathology results. Lupita FORBES Commander, MD 04/03/2024 10:50:28 AM This report has been  signed electronically.

## 2024-04-03 NOTE — Patient Instructions (Addendum)
 I dilated the esophagus to help you swallow better. There was no narrow area but the esophagus was tortuous, suggesting abnormal contraction of the esophagus dysmotility.  I took biopsies of stomach lining and stomach polyps and will let you know those results and any recommendations related.  Please follow a GERD diet (see sheet). Also cut food small pieces, chew well and follow solid food with liquid  I appreciate the opportunity to care for you. Kristen CHARLENA Commander, MD, [facility]  Clear liquids x 1 hour then soft foods rest of  day. Start prior diet tomorrow and use GERD prevention diet. Also cut food small pieces, chew well and follow solid food with liquid. Continue present medications. On omeprazole  40 mg daily and Pepcid  20 mg at bedtime Await pathology results.  YOU HAD AN ENDOSCOPIC PROCEDURE TODAY AT THE [facility]:   Refer to the procedure report that was given to you for any specific questions about what was found during the examination.  If the procedure report does not answer your questions, please call your gastroenterologist to clarify.  If you requested that your care partner not be given the details of your procedure findings, then the procedure report has been included in a sealed envelope for you to review at your convenience later.  YOU SHOULD EXPECT: Some feelings of bloating in the abdomen. Passage of more gas than usual.  Walking can help get rid of the air that was put into your GI tract during the procedure and reduce the bloating. If you had a lower endoscopy (such as a colonoscopy or flexible sigmoidoscopy) you may notice spotting of blood in your stool or on the toilet paper. If you underwent a bowel prep for your procedure, you may not have a normal bowel movement for a few days.  Please Note:  You might notice some irritation and congestion in your nose or some drainage.  This is from the oxygen used during your procedure.  There is no need for concern and it  should clear up in a day or so.  SYMPTOMS TO REPORT IMMEDIATELY: Following upper endoscopy (EGD)  Vomiting of blood or coffee ground material  New chest pain or pain under the shoulder blades  Painful or persistently difficult swallowing  New shortness of breath  Fever of 100F or higher  Black, tarry-looking stools  For urgent or emergent issues, a gastroenterologist can be reached at any hour by calling [phone]. Do not use MyChart messaging for urgent concerns.    DIET:  FOLLOW DILATION DIET!!!  Drink plenty of fluids but you should avoid alcoholic beverages for 24 hours.  ACTIVITY:  You should plan to take it easy for the rest of today and you should NOT DRIVE or use heavy machinery until tomorrow (because of the sedation medicines used during the test).    FOLLOW UP: Our staff will call the number listed on your records the next business day following your procedure.  We will call around 7:15- 8:00 am to check on you and address any questions or concerns that you may have regarding the information given to you following your procedure. If we do not reach you, we will leave a message.     If any biopsies were taken you will be contacted by phone or by letter within the next 1-3 weeks.  Please call us  at [phone] if you have not heard about the biopsies in 3 weeks.    SIGNATURES/CONFIDENTIALITY: You and/or your care partner have  signed paperwork which will be entered into your electronic medical record.  These signatures attest to the fact that that the information above on your After Visit Summary has been reviewed and is understood.  Full responsibility of the confidentiality of this discharge information lies with you and/or your care-partner.

## 2024-04-04 ENCOUNTER — Telehealth: Payer: Self-pay

## 2024-04-04 NOTE — Telephone Encounter (Signed)
 Follow up call to pt, no answer.

## 2024-04-09 ENCOUNTER — Encounter: Payer: Self-pay | Admitting: Internal Medicine

## 2024-04-12 ENCOUNTER — Other Ambulatory Visit: Payer: Self-pay | Admitting: Family Medicine

## 2024-04-14 ENCOUNTER — Other Ambulatory Visit: Payer: Self-pay | Admitting: Family Medicine

## 2024-04-18 ENCOUNTER — Encounter: Payer: Self-pay | Admitting: Internal Medicine

## 2024-04-19 ENCOUNTER — Ambulatory Visit (INDEPENDENT_AMBULATORY_CARE_PROVIDER_SITE_OTHER): Admitting: Otolaryngology

## 2024-04-19 VITALS — BP 148/78 | HR 62 | Temp 97.4°F

## 2024-04-19 DIAGNOSIS — J0101 Acute recurrent maxillary sinusitis: Secondary | ICD-10-CM

## 2024-04-19 DIAGNOSIS — J310 Chronic rhinitis: Secondary | ICD-10-CM

## 2024-04-19 MED ORDER — LEVOFLOXACIN 500 MG PO TABS: 500.0000 mg | ORAL_TABLET | Freq: Every day | ORAL | 0 refills | Status: AC

## 2024-04-19 NOTE — Progress Notes (Signed)
 Patient ID: [name], female   DOB: January 07, 1946, 78 y.o.   MRN: [id number]  Follow-up: Bilateral chronic rhinosinusitis and polyposis  HPI: The patient is a 78 year old female who returns today for her follow-up evaluation. The patient has a history of chronic rhinosinusitis and chronic nasal obstruction.  He previously underwent bilateral endoscopic sinus surgery and bilateral turbinate reduction in January 2024.  The patient returns today complaining of increasing facial pain, nasal drainage, and foul odor from the nasal cavities for the past week.  The patient is currently on Flonase  nasal spray daily.  She denies any fever or visual change.  Exam: General: Communicates without difficulty, well nourished, no acute distress. Head: Normocephalic, no evidence injury, no tenderness, facial buttresses intact without stepoff. Face/sinus: No tenderness to palpation and percussion. Facial movement is normal and symmetric. Eyes: PERRL, EOMI. No scleral icterus, conjunctivae clear. Neuro: CN II exam reveals vision grossly intact.  No nystagmus at any point of gaze. Ears: Auricles well formed without lesions.  Ear canals are intact without mass or lesion.  No erythema or edema is appreciated.  The TMs are intact without fluid. Nose: External evaluation reveals normal support and skin without lesions.  Dorsum is intact.  Anterior rhinoscopy reveals erythematous and edematous mucosa over anterior aspect of inferior turbinates and intact septum. Oral:  Oral cavity and oropharynx are intact, symmetric, without erythema or edema.  Mucosa is moist without lesions. Neck: Full range of motion without pain.  There is no significant lymphadenopathy.  No masses palpable.  Thyroid  bed within normal limits to palpation.  Parotid glands and submandibular glands equal bilaterally without mass.  Trachea is midline. Neuro:  CN 2-12 grossly intact.    Assessment: 1.  Acute rhinosinusitis, with erythematous and edematous nasal  mucosa. 2.  The patient's sinus openings are patent secondary to her previous surgery.  Plan: 1.  The physical exam findings are reviewed with the patient. 2.  Levofloxacin  500 mg p.o. daily for 10 days.  Continue Flonase  nasal spray daily. 3.  Nasal saline irrigation daily. 4.  The patient will return for reevaluation in 2 weeks.

## 2024-04-21 DIAGNOSIS — J0101 Acute recurrent maxillary sinusitis: Secondary | ICD-10-CM | POA: Insufficient documentation

## 2024-04-24 ENCOUNTER — Other Ambulatory Visit: Payer: Self-pay | Admitting: Internal Medicine

## 2024-04-24 DIAGNOSIS — I428 Other cardiomyopathies: Secondary | ICD-10-CM

## 2024-04-24 DIAGNOSIS — I493 Ventricular premature depolarization: Secondary | ICD-10-CM

## 2024-04-29 ENCOUNTER — Ambulatory Visit: Admitting: Family Medicine

## 2024-04-29 ENCOUNTER — Ambulatory Visit: Payer: Self-pay | Admitting: Family Medicine

## 2024-04-29 ENCOUNTER — Encounter: Payer: Self-pay | Admitting: Family Medicine

## 2024-04-29 VITALS — BP 156/73 | HR 79 | Temp 97.7°F | Ht 64.0 in | Wt 200.6 lb

## 2024-04-29 DIAGNOSIS — J208 Acute bronchitis due to other specified organisms: Secondary | ICD-10-CM | POA: Diagnosis not present

## 2024-04-29 DIAGNOSIS — J9801 Acute bronchospasm: Secondary | ICD-10-CM

## 2024-04-29 DIAGNOSIS — R051 Acute cough: Secondary | ICD-10-CM | POA: Diagnosis not present

## 2024-04-29 MED ORDER — ALBUTEROL SULFATE HFA 108 (90 BASE) MCG/ACT IN AERS
2.0000 | INHALATION_SPRAY | RESPIRATORY_TRACT | 2 refills | Status: AC | PRN
Start: 2024-04-29 — End: ?

## 2024-04-29 MED ORDER — PREDNISONE 20 MG PO TABS
20.0000 mg | ORAL_TABLET | Freq: Every day | ORAL | 0 refills | Status: DC
Start: 2024-04-29 — End: 2024-05-14

## 2024-04-29 NOTE — Progress Notes (Signed)
 Subjective  CC:  Chief Complaint  Patient presents with   Cough    Pt stated that she has had a cough since thrusday. COVID test came back negative    HPI: [name] is a 78 y.o. female who presents to the office today to address the problems listed above in the chief complaint. Discussed the use of AI scribe software for clinical note transcription with the patient, who gave verbal consent to proceed.  History of Present Illness [name] is a 78 year old female who presents with persistent cough and respiratory symptoms.  Upper and lower respiratory symptoms - Onset of green nasal discharge 11 days ago, initially noted when bending over, I reviewed ENT notes , treated with levofloxacin  x 10 days for acute sinusitis.  She finished Levaquin  yesterday.  - Progression of symptoms with nasal discharge moving down and development of cough - Persistent cough with associated difficulty breathing - Cough triggers sensation of head pressure described as feeling like head is going to explode - Chest tightness and wheezing present - No pleuritic chest pain. - No fever during this illness - Home COVID test performed on Saturday was negative  Prior respiratory treatments and medication tolerance - Completed course of levofloxacin , finished yesterday, with worsening of symptoms during treatment - History of inhaler use several years ago, uncertain if currently available - Previous use of oral prednisone  without adverse effects - History of reactions to cortisone injections, reports hives with Depo-Medrol  and steroid injection.  Has tolerated prednisone  in the past.  She is also allergic to codeine.  She is diabetic.  Assessment  1. Acute viral bronchitis   2. Bronchospasm   3. Acute cough      Plan  Assessment and Plan Assessment & Plan Acute viral respiratory infection with cough and bronchospasm, likely viral bronchitis Worsening cough and bronchospasm suggest viral  etiology. Negative COVID test. Completed levofloxacin  without improvement, indicating non-bacterial cause. - Prescribed prednisone  for inflammation and bronchospasm.  Brief low-dose burst, 20 mg daily for 5 days - Prescribed inhaler for bronchospasm management.  Albuterol  as needed - Over-the-counter cough syrup - Advised increased water  intake and avoidance of sugary foods due to prednisone . - Instructed to monitor symptoms for improvement over 3-4 days.    Follow up: As needed No orders of the defined types were placed in this encounter.  Meds ordered this encounter  Medications   predniSONE  (DELTASONE ) 20 MG tablet    Sig: Take 1 tablet (20 mg total) by mouth daily with breakfast.    Dispense:  5 tablet    Refill:  0   albuterol  (VENTOLIN  HFA) 108 (90 Base) MCG/ACT inhaler    Sig: Inhale 2 puffs into the lungs every 4 (four) hours as needed for wheezing or shortness of breath.    Dispense:  1 each    Refill:  2     I reviewed the patients updated PMH, FH, and SocHx.  Patient Active Problem List   Diagnosis Date Noted   Stage 3a chronic kidney disease (HCC) 10/04/2022    Priority: High   Combined hyperlipidemia associated with type 2 diabetes mellitus (HCC) 09/28/2021    Priority: High   Nonischemic cardiomyopathy (HCC) 10/06/2020    Priority: High   Heart failure with mildly reduced ejection fraction (HFmrEF, 41-49%) (HCC) 08/24/2020    Priority: High   Type 2 diabetes mellitus with peripheral neuropathy (HCC) 04/09/2013    Priority: High   Hypertension associated with diabetes (  HCC) 11/08/2010    Priority: High   History of total knee arthroplasty 2024, Dr. Hiram 05/26/2023    Priority: Medium    Vocal fold paresis, left 04/21/2022    Priority: Medium    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    Priority: Medium    Gout 07/19/2021    Priority: Medium    Rotator cuff tear arthropathy 10/22/2020    Priority: Medium     Primary osteoarthritis of left shoulder 07/29/2020    Priority: Medium    DDD (degenerative disc disease), cervical 04/30/2020    Priority: Medium    Osteoarthritis of left knee 11/05/2018    Priority: Medium    Chronic low back pain 09/05/2017    Priority: Medium    Degenerative lumbar disc 06/04/2015    Priority: Medium    S/P lumbar laminectomy 06/04/2015    Priority: Medium    Restless leg syndrome 05/13/2014    Priority: Medium    Spinal stenosis of lumbar region without neurogenic claudication 09/12/2012    Priority: Medium    GERD (gastroesophageal reflux disease) 05/25/2011    Priority: Medium    Mitral valve prolapse 10/10/2008    Priority: Medium    Screening for osteoporosis 10/04/2022    Priority: Low   Old tear of meniscus of left knee 09/28/2021    Priority: Low   S/P shoulder replacement, right 12/14/2020    Priority: Low   Chronic pansinusitis 10/04/2017    Priority: Low   Hemorrhoids, internal, with bleeding 05/25/2011    Priority: Low   Chronic allergic rhinitis 08/19/2010    Priority: Low   Acute recurrent maxillary sinusitis 04/21/2024   Other dysphagia 03/01/2024   Pure hypercholesterolemia 10/30/2023   PVC's (premature ventricular contractions) 10/30/2023   CAD (coronary artery disease) 10/30/2023   Mass of buttock 08/08/2023   Vertigo 06/20/2023   Arthrofibrosis of total knee replacement 06/05/2023   Current Meds  Medication Sig   albuterol  (VENTOLIN  HFA) 108 (90 Base) MCG/ACT inhaler Inhale 2 puffs into the lungs every 4 (four) hours as needed for wheezing or shortness of breath.   allopurinol  (ZYLOPRIM ) 100 MG tablet TAKE 1 TABLET BY MOUTH EVERY DAY   BD PEN NEEDLE MINI ULTRAFINE 31G X 5 MM MISC USE AS DIRECTED   Calcium -Magnesium -Zinc  (CAL-MAG-ZINC  PO) Take 1 tablet by mouth in the morning.   Carboxymeth-Glyc-Polysorb PF (REFRESH OPTIVE MEGA-3) 0.5-1-0.5 % SOLN Place 1-2 drops into both eyes 3 (three) times daily as needed (dry/irritated  eyes.).   carvedilol  (COREG ) 3.125 MG tablet TAKE 1 TABLET BY MOUTH 2 TIMES DAILY.   cholecalciferol  (VITAMIN D ) 1000 UNITS tablet Take 1,000 Units by mouth in the morning.   cyanocobalamin (VITAMIN B12) 1000 MCG tablet Take 1,000 mcg by mouth in the morning.   dapagliflozin  propanediol (FARXIGA ) 10 MG TABS tablet Take 1 tablet (10 mg total) by mouth daily before breakfast.   ENTRESTO  49-51 MG Take 1 tablet by mouth 2 (two) times daily.   famotidine  (PEPCID ) 20 MG tablet Take 20 mg by mouth at bedtime.   fluticasone  (FLONASE ) 50 MCG/ACT nasal spray INSTILL 1 SPRAY INTO BOTH NOSTRILS DAILY   furosemide  (LASIX ) 20 MG tablet Take 1 tablet (20 mg total) by mouth as directed. Monday,wed, Friday and sat only.   hydrALAZINE  (APRESOLINE ) 10 MG tablet Take 2 tablets (20 mg total) by mouth 2 (two) times daily.   ibuprofen (ADVIL) 200 MG tablet Take 600 mg by mouth  daily as needed (pain.).   insulin  glargine (LANTUS  SOLOSTAR) 100 UNIT/ML Solostar Pen Inject 25 Units into the skin daily.   ketorolac (ACULAR) 0.5 % ophthalmic solution Place 1 drop into the right eye 4 (four) times daily.   levofloxacin  (LEVAQUIN ) 500 MG tablet Take 1 tablet (500 mg total) by mouth daily for 10 days.   metFORMIN  (GLUCOPHAGE ) 500 MG tablet TAKE 1 TABLET BY MOUTH TWICE A DAY WITH FOOD   montelukast  (SINGULAIR ) 10 MG tablet TAKE 1 TABLET BY MOUTH EVERY DAY   moxifloxacin (VIGAMOX) 0.5 % ophthalmic solution Place 1 drop into the left eye 4 (four) times daily.   Multiple Vitamin (MULTIVITAMIN WITH MINERALS) TABS tablet Take 1 tablet by mouth in the morning.   omeprazole  (PRILOSEC ) 40 MG capsule TAKE 1 CAPSULE (40 MG TOTAL) BY MOUTH DAILY.   prednisoLONE acetate (PRED FORTE) 1 % ophthalmic suspension Place 1 drop into the right eye 4 (four) times daily.   predniSONE  (DELTASONE ) 20 MG tablet Take 1 tablet (20 mg total) by mouth daily with breakfast.   pregabalin  (LYRICA ) 100 MG capsule Take 1 capsule by mouth twice daily    rOPINIRole  (REQUIP ) 3 MG tablet TAKE 1 TABLET BY MOUTH EVERYDAY AT BEDTIME   rosuvastatin  (CRESTOR ) 20 MG tablet TAKE 1 TABLET BY MOUTH EVERY DAY   spironolactone  (ALDACTONE ) 25 MG tablet TAKE 1/2 TABLET BY MOUTH EVERY DAY   Allergies: Patient is allergic to amlodipine , amoxicillin -pot clavulanate, depo-medrol  [methylprednisolone ], gabapentin, levothyroxine, sitagliptin, ace inhibitors, amoxicillin , codeine, corticosteroids, dexamethasone , and tetracyclines & related. Family History: Patient family history includes Diabetes in her sister; Heart attack in her maternal grandfather and paternal grandfather; Lung cancer in her sister; Pancreatic cancer in her father; Uterine cancer in her sister. Social History:  Patient  reports that she has never smoked. She has never used smokeless tobacco. She reports that she does not drink alcohol and does not use drugs.  Review of Systems: Constitutional: Negative for fever malaise or anorexia Cardiovascular: negative for chest pain Respiratory: negative for SOB or persistent cough Gastrointestinal: negative for abdominal pain  Objective  Vitals: BP (!) 156/73 Comment: sick visit  Pulse 79   Temp 97.7 F (36.5 C)   Ht 5' 4 (1.626 m)   Wt 200 lb 9.6 oz (91 kg)   SpO2 93%   BMI 34.43 kg/m  General: no acute distress , A&Ox3, nontoxic-appearing.  Looks tired.  No respiratory distress HEENT: PEERL, conjunctiva normal, neck is supple Cardiovascular:  RRR  Respiratory:  Good breath sounds bilaterally, coughing with inhalations, mild expiratory wheeze, no rales or rhonchi  Skin:  Warm, no rashes Commons side effects, risks, benefits, and alternatives for medications and treatment plan prescribed today were discussed, and the patient expressed understanding of the given instructions. Patient is instructed to call or message via MyChart if he/she has any questions or concerns regarding our treatment plan. No barriers to understanding were identified. We  discussed Red Flag symptoms and signs in detail. Patient expressed understanding regarding what to do in case of urgent or emergency type symptoms.  Medication list was reconciled, printed and provided to the patient in AVS. Patient instructions and summary information was reviewed with the patient as documented in the AVS. This note was prepared with assistance of Dragon voice recognition software. Occasional wrong-word or sound-a-like substitutions may have occurred due to the inherent limitations of voice recognition software

## 2024-04-29 NOTE — Telephone Encounter (Signed)
 FYI Only or Action Required?: FYI only for provider.  Patient was last seen in primary care on 01/05/2024 by Jodie Lavern CROME, MD.  Called Nurse Triage reporting Cough.  Symptoms began several days ago.  Interventions attempted: OTC medications: Mucinex .  Symptoms are: unchanged.  Triage Disposition: See HCP Within 4 Hours (Or PCP Triage)  Patient/caregiver understands and will follow disposition?: Yes                Copied from CRM #[id number]. Topic: Clinical - Red Word Triage >> Apr 29, 2024  8:19 AM [name] wrote: [facility] that prompted transfer to Nurse Triage: chest congestion, cough, SOB Reason for Disposition  [1] MILD difficulty breathing (e.g., minimal/no SOB at rest, SOB with walking, pulse < 100) AND [2] still present when not coughing  Answer Assessment - Initial Assessment Questions Started as sinus infection Coughing up green stuff Sometimes difficult to breathe; just sitting can have SOB, kind of all the time, moderate Denies chest pain unless coughing Has headache with cough Denies fever Onset: last Thurs COVID test negative Wheezing   HEMOPTYSIS: Are you coughing up any blood? If Yes, ask: How much? (e.g., flecks, streaks, tablespoons, etc.)     No  Protocols used: Cough - Acute Productive-A-AH

## 2024-04-29 NOTE — Telephone Encounter (Signed)
 Noted

## 2024-05-10 ENCOUNTER — Other Ambulatory Visit: Payer: Self-pay | Admitting: Family

## 2024-05-13 ENCOUNTER — Ambulatory Visit (INDEPENDENT_AMBULATORY_CARE_PROVIDER_SITE_OTHER): Admitting: Otolaryngology

## 2024-05-14 ENCOUNTER — Ambulatory Visit: Payer: Medicare Other

## 2024-05-14 VITALS — BP 140/66 | HR 78 | Temp 98.1°F | Ht 64.0 in | Wt 200.8 lb

## 2024-05-14 DIAGNOSIS — Z0000 Encounter for general adult medical examination without abnormal findings: Secondary | ICD-10-CM | POA: Diagnosis not present

## 2024-05-14 NOTE — Patient Instructions (Signed)
 Ms. Courtois,  Thank you for taking the time for your Medicare Wellness Visit. I appreciate your continued commitment to your health goals. Please review the care plan we discussed, and feel free to reach out if I can assist you further.  Medicare recommends these wellness visits once per year to help you and your care team stay ahead of potential health issues. These visits are designed to focus on prevention, allowing your provider to concentrate on managing your acute and chronic conditions during your regular appointments.  Please note that Annual Wellness Visits do not include a physical exam. Some assessments may be limited, especially if the visit was conducted virtually. If needed, we may recommend a separate in-person follow-up with your provider.  Ongoing Care Seeing your primary care provider every 3 to 6 months helps us  monitor your health and provide consistent, personalized care.   Referrals If a referral was made during today's visit and you haven't received any updates within two weeks, please contact the referred provider directly to check on the status.  Recommended Screenings:  Health Maintenance  Topic Date Due   Medicare Annual Wellness Visit  05/08/2024   COVID-19 Vaccine (4 - 2025-26 season) 05/15/2024*   Flu Shot  10/29/2024*   Hemoglobin A1C  07/06/2024   Yearly kidney health urinalysis for diabetes  10/05/2024   Complete foot exam   10/05/2024   Eye exam for diabetics  10/05/2024   Yearly kidney function blood test for diabetes  11/15/2024   Breast Cancer Screening  11/16/2024   DEXA scan (bone density measurement)  02/11/2026   Pneumococcal Vaccine for age over 33  Completed   Zoster (Shingles) Vaccine  Completed   Meningitis B Vaccine  Aged Out   DTaP/Tdap/Td vaccine  Discontinued   Colon Cancer Screening  Discontinued   Hepatitis C Screening  Discontinued  *Topic was postponed. The date shown is not the original due date.       08/08/2023    6:29 AM   Advanced Directives  Does Patient Have a Medical Advance Directive? No  Would patient like information on creating a medical advance directive? No - Patient declined   Advance Care Planning is important because it: Ensures you receive medical care that aligns with your values, goals, and preferences. Provides guidance to your family and loved ones, reducing the emotional burden of decision-making during critical moments.  Vision: Annual vision screenings are recommended for early detection of glaucoma, cataracts, and diabetic retinopathy. These exams can also reveal signs of chronic conditions such as diabetes and high blood pressure.  Dental: Annual dental screenings help detect early signs of oral cancer, gum disease, and other conditions linked to overall health, including heart disease and diabetes.  Please see the attached documents for additional preventive care recommendations.

## 2024-05-14 NOTE — Progress Notes (Signed)
 Subjective:   [name] is a 78 y.o. who presents for a Medicare Wellness preventive visit.  As a reminder, Annual Wellness Visits don't include a physical exam, and some assessments may be limited, especially if this visit is performed virtually. We may recommend an in-person follow-up visit with your provider if needed.  Visit Complete: In person    Persons Participating in Visit: Patient.  AWV Questionnaire: No: Patient Medicare AWV questionnaire was not completed prior to this visit.  Cardiac Risk Factors include: advanced age (>39men, >18 women);diabetes mellitus;dyslipidemia;obesity (BMI >30kg/m2);hypertension     Objective:    Today's Vitals   05/14/24 1338 05/14/24 1341  BP: (!) 140/66   Pulse: 78   Temp: 98.1 F (36.7 C)   SpO2: 95%   Weight: 200 lb 12.8 oz (91.1 kg)   Height: 5' 4 (1.626 m)   PainSc:  3    Body mass index is 34.47 kg/m.     05/14/2024    1:47 PM 08/08/2023    6:29 AM 08/03/2023   10:44 AM 06/01/2023    8:11 AM 05/09/2023    1:35 PM 04/27/2023    4:24 PM 04/24/2023   11:13 AM  Advanced Directives  Does Patient Have a Medical Advance Directive? No No No No No No No  Would patient like information on creating a medical advance directive? No - Patient declined No - Patient declined No - Patient declined No - Patient declined No - Patient declined  No - Patient declined    Current Medications (verified) Outpatient Encounter Medications as of 05/14/2024  Medication Sig   albuterol  (VENTOLIN  HFA) 108 (90 Base) MCG/ACT inhaler Inhale 2 puffs into the lungs every 4 (four) hours as needed for wheezing or shortness of breath.   allopurinol  (ZYLOPRIM ) 100 MG tablet TAKE 1 TABLET BY MOUTH EVERY DAY   BD PEN NEEDLE MINI ULTRAFINE 31G X 5 MM MISC USE AS DIRECTED   Calcium -Magnesium -Zinc  (CAL-MAG-ZINC  PO) Take 1 tablet by mouth in the morning.   Carboxymeth-Glyc-Polysorb PF (REFRESH OPTIVE MEGA-3) 0.5-1-0.5 % SOLN Place 1-2 drops into both eyes 3  (three) times daily as needed (dry/irritated eyes.).   carvedilol  (COREG ) 3.125 MG tablet TAKE 1 TABLET BY MOUTH 2 TIMES DAILY.   cholecalciferol  (VITAMIN D ) 1000 UNITS tablet Take 1,000 Units by mouth in the morning.   cyanocobalamin (VITAMIN B12) 1000 MCG tablet Take 1,000 mcg by mouth in the morning.   dapagliflozin  propanediol (FARXIGA ) 10 MG TABS tablet Take 1 tablet (10 mg total) by mouth daily before breakfast.   ENTRESTO  49-51 MG Take 1 tablet by mouth 2 (two) times daily.   famotidine  (PEPCID ) 20 MG tablet Take 20 mg by mouth at bedtime.   fluticasone  (FLONASE ) 50 MCG/ACT nasal spray INSTILL 1 SPRAY INTO BOTH NOSTRILS DAILY   furosemide  (LASIX ) 20 MG tablet Take 1 tablet (20 mg total) by mouth as directed. Monday,wed, Friday and sat only.   hydrALAZINE  (APRESOLINE ) 10 MG tablet Take 2 tablets (20 mg total) by mouth 2 (two) times daily.   ibuprofen (ADVIL) 200 MG tablet Take 600 mg by mouth daily as needed (pain.).   insulin  glargine (LANTUS  SOLOSTAR) 100 UNIT/ML Solostar Pen Inject 25 Units into the skin daily.   metFORMIN  (GLUCOPHAGE ) 500 MG tablet TAKE 1 TABLET BY MOUTH TWICE A DAY WITH FOOD   montelukast  (SINGULAIR ) 10 MG tablet TAKE 1 TABLET BY MOUTH EVERY DAY   Multiple Vitamin (MULTIVITAMIN WITH MINERALS) TABS tablet Take 1 tablet by mouth in  the morning.   omeprazole  (PRILOSEC ) 40 MG capsule TAKE 1 CAPSULE (40 MG TOTAL) BY MOUTH DAILY.   pregabalin  (LYRICA ) 100 MG capsule Take 1 capsule by mouth twice daily   rOPINIRole  (REQUIP ) 3 MG tablet TAKE 1 TABLET BY MOUTH EVERYDAY AT BEDTIME   rosuvastatin  (CRESTOR ) 20 MG tablet TAKE 1 TABLET BY MOUTH EVERY DAY   spironolactone  (ALDACTONE ) 25 MG tablet TAKE 1/2 TABLET BY MOUTH EVERY DAY   [DISCONTINUED] ketorolac (ACULAR) 0.5 % ophthalmic solution Place 1 drop into the right eye 4 (four) times daily.   [DISCONTINUED] moxifloxacin (VIGAMOX) 0.5 % ophthalmic solution Place 1 drop into the left eye 4 (four) times daily.   [DISCONTINUED]  prednisoLONE acetate (PRED FORTE) 1 % ophthalmic suspension Place 1 drop into the right eye 4 (four) times daily.   [DISCONTINUED] predniSONE  (DELTASONE ) 20 MG tablet Take 1 tablet (20 mg total) by mouth daily with breakfast.   No facility-administered encounter medications on file as of 05/14/2024.    Allergies (verified) Amlodipine , Amoxicillin -pot clavulanate, Depo-medrol  [methylprednisolone ], Gabapentin, Levothyroxine, Sitagliptin, Prednisone , Ace inhibitors, Amoxicillin , Codeine, Corticosteroids, Dexamethasone , and Tetracyclines & related   History: Past Medical History:  Diagnosis Date   Allergy    Anxiety    Arthritis    CAD (coronary artery disease) 10/30/2023   LHC 09/07/20: mid LAD 10-20%, otherwise no significant CAD    Cataract    Chronic back pain    buldging disc,scoliosis,arthritis   Chronic kidney disease    CKD3a   Claustrophobia    Congestive heart failure with left ventricular systolic dysfunction (HCC) 08/24/2020   Depression    Diabetes mellitus without complication (HCC)    takes Trulicity ,Jardiance,and Metformin  daily.Average fasting blood sugar runs around130   Dysrhythmia    PVCs, has MVP   Fibromyalgia    GERD (gastroesophageal reflux disease)    takes Omeprazole  daily   Gout 07/19/2021   On allopurinol    History of bronchitis > 8 yrs ago   History of shingles    HTN (hypertension)    takes Amlodipine  and Micardis daily   Hx of colonic polyps    benign   Internal and external hemorrhoids without complication    Joint pain    Mitral valve prolapse    Nocturia    Pneumonia    PONV (postoperative nausea and vomiting)    when gets injections in joints gets hives.Betadine rash   Restless leg syndrome    takes Requip  at bedtime   Seasonal allergies    takes Claritin daily as needed   Urinary frequency    Uterine fibroid    Past Surgical History:  Procedure Laterality Date   BUNIONECTOMY Bilateral    COLONOSCOPY  07/12/2004   diverticulosis,  internal and external hemorrhoids   ETHMOIDECTOMY Bilateral 08/15/2022   Procedure: ETHMOIDECTOMY WITH TISSUE REMOVAL;  Surgeon: Karis Clunes, MD;  Location: [facility];  Service: ENT;  Laterality: Bilateral;   FINGER ARTHROSCOPY WITH CARPOMETACARPEL (CMC) ARTHROPLASTY Right 09/28/2015   Procedure: RIGHT THUMB TRAPEZIUM EXCISION WITH CARPOMETACARPEL (CMC) ARTHROPLASTY AND TENDON TRANSFER;  Surgeon: Prentice Pagan, MD;  Location: [facility];  Service: Orthopedics;  Laterality: Right;   FRONTAL SINUS EXPLORATION Bilateral 08/15/2022   Procedure: FRONTAL RECESS SINUS EXPLORATION WITH NAVIGATION;  Surgeon: Karis Clunes, MD;  Location: [facility];  Service: ENT;  Laterality: Bilateral;   HEMORRHOID SURGERY     almost 40 yrs ago   HYSTEROSCOPY WITH D & C  08/23/2000   and resectoscopic myomectomy  KNEE CLOSED REDUCTION Left 06/05/2023   Procedure: CLOSED MANIPULATION KNEE;  Surgeon: Melodi Lerner, MD;  Location: [facility];  Service: Orthopedics;  Laterality: Left;   LUMBAR DISC SURGERY  03/16/2005   LUMBAR EPIDURAL INJECTION     MASS EXCISION Right 08/08/2023   Procedure: EXCISION MASS RIGHT BUTTOCKS;  Surgeon: Evonnie Dorothyann LABOR, DO;  Location: [facility];  Service: General;  Laterality: Right;   MAXILLARY ANTROSTOMY Bilateral 08/15/2022   Procedure: ENDOSCOPIC MAXILLARY ANTROSTOMY WITH TISSUE REMOVAL;  Surgeon: Karis Clunes, MD;  Location: [facility];  Service: ENT;  Laterality: Bilateral;   PLANTAR FASCIA RELEASE Left 02/03/2010   and torn tendon   REVERSE SHOULDER ARTHROPLASTY Right 12/14/2020   Procedure: REVERSE SHOULDER ARTHROPLASTY;  Surgeon: Kay Kemps, MD;  Location: [facility];  Service: Orthopedics;  Laterality: Right;   RIGHT/LEFT HEART CATH AND CORONARY ANGIOGRAPHY N/A 09/07/2020   Procedure: RIGHT/LEFT HEART CATH AND CORONARY ANGIOGRAPHY;  Surgeon: Mady Bruckner, MD;  [facility]: [facility];  Service: Cardiovascular;  Laterality: N/A;   SINUS ENDO  W/FUSION Bilateral 08/15/2022   Procedure: SPHENOIDECTOMY WITH TISSUE REMOVAL WITH NAVIGATION;  Surgeon: Karis Clunes, MD;  Location: [facility];  Service: ENT;  Laterality: Bilateral;   TENDON TRANSFER Right 09/28/2015   Procedure: TENDON TRANSFER;  Surgeon: Prentice Pagan, MD;  Location: [facility];  Service: Orthopedics;  Laterality: Right;   TONSILLECTOMY     TOTAL KNEE ARTHROPLASTY Left 04/24/2023   Procedure: TOTAL KNEE ARTHROPLASTY;  Surgeon: Melodi Lerner, MD;  Location: [facility];  Service: Orthopedics;  Laterality: Left;   TUBAL LIGATION     TURBINATE REDUCTION Bilateral 08/15/2022   Procedure: TURBINATE REDUCTION;  Surgeon: Karis Clunes, MD;  Location: [facility];  Service: ENT;  Laterality: Bilateral;   WRIST SURGERY     left, removal of cyst   Family History  Problem Relation Age of Onset   Pancreatic cancer Father    Lung cancer Sister    Uterine cancer Sister    Diabetes Sister    Heart attack Maternal Grandfather    Heart attack Paternal Grandfather    Colon cancer Neg Hx    Breast cancer Neg Hx    Esophageal cancer Neg Hx    Stomach cancer Neg Hx    Rectal cancer Neg Hx    Social History   Socioeconomic History   Marital status: Married    Spouse name: Not on file   Number of children: 3   Years of education: Not on file   Highest education level: Associate degree: occupational, Scientist, product/process development, or vocational program  Occupational History   Occupation: Retired   Tobacco Use   Smoking status: Never   Smokeless tobacco: Never  Vaping Use   Vaping status: Never Used  Substance and Sexual Activity   Alcohol use: No    Alcohol/week: 0.0 standard drinks of alcohol   Drug use: No   Sexual activity: Not Currently  Other Topics Concern   Not on file  Social History Narrative   1 caffeine drinks daily         Social Drivers of Health   Financial Resource Strain: Low Risk  (05/14/2024)   Overall Financial Resource Strain (CARDIA)    Difficulty of  Paying Living Expenses: Not hard at all  Food Insecurity: No Food Insecurity (05/14/2024)   Hunger Vital Sign    Worried About Running Out of Food in the Last Year: Never true    Ran Out of Food in  the Last Year: Never true  Transportation Needs: No Transportation Needs (05/14/2024)   PRAPARE - Administrator, Civil Service (Medical): No    Lack of Transportation (Non-Medical): No  Physical Activity: Inactive (05/14/2024)   Exercise Vital Sign    Days of Exercise per Week: 0 days    Minutes of Exercise per Session: 0 min  Stress: No Stress Concern Present (05/14/2024)   [employer] of Occupational Health - Occupational Stress Questionnaire    Feeling of Stress: Not at all  Social Connections: Socially Integrated (05/14/2024)   Social Connection and Isolation Panel    Frequency of Communication with Friends and Family: Twice a week    Frequency of Social Gatherings with Friends and Family: More than three times a week    Attends Religious Services: More than 4 times per year    Active Member of [employer] or Organizations: Yes    Attends Banker Meetings: 1 to 4 times per year    Marital Status: Married    Tobacco Counseling Counseling given: Not Answered    Clinical Intake:  Pre-visit preparation completed: Yes  Pain : 0-10 Pain Score: 3  Pain Type: Chronic pain Pain Location: Back Pain Orientation: Lower Pain Descriptors / Indicators: Aching Pain Onset: More than a month ago Pain Frequency: Constant     BMI - recorded: 34.47 Nutritional Status: BMI > 30  Obese Nutritional Risks: None Diabetes: Yes CBG done?: No CBG resulted in Enter/ Edit results?: No Did pt. bring in CBG monitor from home?: No  Lab Results  Component Value Date   HGBA1C 7.3 (A) 01/05/2024   HGBA1C 7.9 (A) 10/06/2023   HGBA1C 7.7 (A) 07/07/2023     How often do you need to have someone help you when you read instructions, pamphlets, or other written materials from  your doctor or pharmacy?: 1 - Never  Interpreter Needed?: No  Information entered by :: Ellouise Haws, LPN   Activities of Daily Living     05/14/2024    1:43 PM 08/08/2023    6:34 AM  In your present state of health, do you have any difficulty performing the following activities:  Hearing? 0 0  Vision? 0 0  Difficulty concentrating or making decisions? 0 0  Walking or climbing stairs? 0   Dressing or bathing? 0   Doing errands, shopping? 0   Preparing Food and eating ? N   Using the Toilet? N   In the past six months, have you accidently leaked urine? Y   Comment with coughing only   Do you have problems with loss of bowel control? N   Managing your Medications? N   Managing your Finances? N   Housekeeping or managing your Housekeeping? N     Patient Care Team: Jodie Lavern CROME, MD as PCP - General (Family Medicine) Okey Vina GAILS, MD as PCP - Cardiology (Cardiology) Ethyl Lonni BRAVO, MD (Inactive) as Consulting Physician (Otolaryngology) Kay Kemps, MD as Consulting Physician (Orthopedic Surgery) [facility] (Ophthalmology)  I have updated your Care Teams any recent Medical Services you may have received from other providers in the past year.     Assessment:   This is a routine wellness examination for [facility]  Hearing/Vision screen Hearing Screening - Comments:: Pt denies any hearing issues  Vision Screening - Comments:: Wears rx glasses - up to date with routine eye exams with [facility]    Goals Addressed  This Visit's Progress    exercise more low impact '         Depression Screen     05/14/2024    1:44 PM 04/29/2024    9:50 AM 01/05/2024   11:29 AM 11/06/2023   11:18 AM 10/06/2023    8:53 AM 05/09/2023    1:36 PM 10/04/2022    9:28 AM  PHQ 2/9 Scores  PHQ - 2 Score 0 0 0 0 0 0 0    Fall Risk     05/14/2024    1:47 PM 04/29/2024    9:50 AM 01/05/2024   11:29 AM 11/06/2023   11:18 AM 10/06/2023    8:53 AM  Fall Risk    Falls in the past year? 0 0 0 0 0  Number falls in past yr: 0 0 0 0 0  Injury with Fall? 0 0 0 0 0  Risk for fall due to : Impaired mobility;Impaired balance/gait No Fall Risks No Fall Risks No Fall Risks No Fall Risks  Follow up Falls prevention discussed Falls evaluation completed Falls evaluation completed Falls evaluation completed Falls evaluation completed    MEDICARE RISK AT HOME:  Medicare Risk at Home Any stairs in or around the home?: Yes If so, are there any without handrails?: No Home free of loose throw rugs in walkways, pet beds, electrical cords, etc?: Yes Adequate lighting in your home to reduce risk of falls?: Yes Life alert?: No Use of a cane, walker or w/c?: Yes Grab bars in the bathroom?: Yes Shower chair or bench in shower?: Yes Elevated toilet seat or a handicapped toilet?: No  TIMED UP AND GO:  Was the test performed?  Yes  Length of time to ambulate 10 feet: 10 sec Gait steady and fast without use of assistive device  Cognitive Function: 6CIT completed        05/14/2024    1:48 PM 05/09/2023    1:39 PM 05/06/2022    2:45 PM 04/23/2021    2:52 PM  6CIT Screen  What Year? 0 points 0 points 0 points 0 points  What month? 0 points 0 points 0 points 0 points  What time? 0 points 0 points 0 points 0 points  Count back from 20 0 points 0 points 0 points 0 points  Months in reverse 0 points 0 points 0 points 0 points  Repeat phrase 0 points 0 points 0 points 0 points  Total Score 0 points 0 points 0 points 0 points    Immunizations Immunization History  Administered Date(s) Administered   Fluad Quad(high Dose 65+) 04/19/2018, 06/16/2020, 07/01/2020, 04/16/2021, 05/10/2022   Fluad Trivalent(High Dose 65+) 05/24/2023   INFLUENZA, HIGH DOSE SEASONAL PF 05/13/2014, 04/24/2015, 04/12/2017, 04/19/2018, 04/24/2019, 06/16/2020   Influenza Split 06/01/2010   Influenza, Seasonal, Injecte, Preservative Fre 04/09/2013, 05/03/2016   Influenza,inj,Quad PF,6+ Mos  05/01/2017   Influenza-Unspecified 04/03/2012, 04/24/2024   Moderna Sars-Covid-2 Vaccination 01/15/2021, 02/12/2021   Pneumococcal Conjugate-13 05/13/2014   Pneumococcal Polysaccharide-23 02/15/2011   Td 08/01/1998   Td (Adult), 2 Lf Tetanus Toxid, Preservative Free 08/01/1998   Tdap 05/01/2004   Unspecified SARS-COV-2 Vaccination 05/11/2021   Zoster Recombinant(Shingrix) 04/24/2019, 08/13/2019   Zoster, Live 02/15/2011    Screening Tests Health Maintenance  Topic Date Due   COVID-19 Vaccine (4 - 2025-26 season) 05/15/2024 (Originally 04/01/2024)   HEMOGLOBIN A1C  07/06/2024   Diabetic kidney evaluation - Urine ACR  10/05/2024   FOOT EXAM  10/05/2024   OPHTHALMOLOGY EXAM  10/05/2024  Diabetic kidney evaluation - eGFR measurement  11/15/2024   Mammogram  11/16/2024   Medicare Annual Wellness (AWV)  05/14/2025   DEXA SCAN  02/11/2026   Pneumococcal Vaccine: 50+ Years  Completed   Influenza Vaccine  Completed   Zoster Vaccines- Shingrix  Completed   Meningococcal B Vaccine  Aged Out   DTaP/Tdap/Td  Discontinued   Colonoscopy  Discontinued   Hepatitis C Screening  Discontinued    Health Maintenance Items Addressed: See Nurse Notes at the end of this note  Additional Screening:  Vision Screening: Recommended annual ophthalmology exams for early detection of glaucoma and other disorders of the eye. Is the patient up to date with their annual eye exam?  Yes  Who is the provider or what is the name of the office in which the patient attends annual eye exams? [facility]   Dental Screening: Recommended annual dental exams for proper oral hygiene  Community Resource Referral / Chronic Care Management: CRR required this visit?  No   CCM required this visit?  No   Plan:    I have personally reviewed and noted the following in the patient's chart:   Medical and social history Use of alcohol, tobacco or illicit drugs  Current medications and supplements including opioid  prescriptions. Patient is not currently taking opioid prescriptions. Functional ability and status Nutritional status Physical activity Advanced directives List of other physicians Hospitalizations, surgeries, and ER visits in previous 12 months Vitals Screenings to include cognitive, depression, and falls Referrals and appointments  In addition, I have reviewed and discussed with patient certain preventive protocols, quality metrics, and best practice recommendations. A written personalized care plan for preventive services as well as general preventive health recommendations were provided to patient.   Ellouise VEAR Haws, LPN   [id number]   After Visit Summary: (MyChart) Due to this being a telephonic visit, the after visit summary with patients personalized plan was offered to patient via MyChart   Notes: Nothing significant to report at this time.

## 2024-05-15 ENCOUNTER — Other Ambulatory Visit: Payer: Self-pay | Admitting: Internal Medicine

## 2024-05-20 ENCOUNTER — Other Ambulatory Visit: Payer: Self-pay

## 2024-05-20 ENCOUNTER — Telehealth: Payer: Self-pay

## 2024-05-20 MED ORDER — MONTELUKAST SODIUM 10 MG PO TABS: 10.0000 mg | ORAL_TABLET | Freq: Every day | ORAL | 1 refills | Status: AC

## 2024-05-20 NOTE — Telephone Encounter (Signed)
 Copied from CRM #[id number]. Topic: Clinical - Prescription Issue >> May 20, 2024  9:50 AM Tinnie BROCKS wrote: Reason for CRM: Pt requested refill through her pharmacy 10 days ago for montelukast  (SINGULAIR ) 10 MG tablet. She only has a couple tablets left. This was prescribed though NP Kenney Roys, renewal provider is Dr. Jodie.  [facility] [phone] - [place], [place] - [address] [place] [address] Phone: [phone] Fax: [phone]

## 2024-06-08 ENCOUNTER — Other Ambulatory Visit: Payer: Self-pay | Admitting: Family

## 2024-06-22 ENCOUNTER — Other Ambulatory Visit: Payer: Self-pay | Admitting: Family Medicine

## 2024-06-28 ENCOUNTER — Emergency Department (HOSPITAL_BASED_OUTPATIENT_CLINIC_OR_DEPARTMENT_OTHER)

## 2024-06-28 ENCOUNTER — Other Ambulatory Visit: Payer: Self-pay

## 2024-06-28 ENCOUNTER — Emergency Department (HOSPITAL_BASED_OUTPATIENT_CLINIC_OR_DEPARTMENT_OTHER)
Admission: EM | Admit: 2024-06-28 | Discharge: 2024-06-28 | Disposition: A | Discharge status: Home / Self Care | Attending: Emergency Medicine | Admitting: Emergency Medicine

## 2024-06-28 DIAGNOSIS — D649 Anemia, unspecified: Secondary | ICD-10-CM | POA: Diagnosis not present

## 2024-06-28 DIAGNOSIS — I509 Heart failure, unspecified: Secondary | ICD-10-CM | POA: Diagnosis not present

## 2024-06-28 DIAGNOSIS — X58XXXA Exposure to other specified factors, initial encounter: Secondary | ICD-10-CM | POA: Diagnosis not present

## 2024-06-28 DIAGNOSIS — R944 Abnormal results of kidney function studies: Secondary | ICD-10-CM | POA: Insufficient documentation

## 2024-06-28 DIAGNOSIS — S39012A Strain of muscle, fascia and tendon of lower back, initial encounter: Secondary | ICD-10-CM | POA: Insufficient documentation

## 2024-06-28 DIAGNOSIS — R110 Nausea: Secondary | ICD-10-CM | POA: Insufficient documentation

## 2024-06-28 DIAGNOSIS — E1165 Type 2 diabetes mellitus with hyperglycemia: Secondary | ICD-10-CM | POA: Diagnosis not present

## 2024-06-28 DIAGNOSIS — Z79899 Other long term (current) drug therapy: Secondary | ICD-10-CM | POA: Diagnosis not present

## 2024-06-28 DIAGNOSIS — Z794 Long term (current) use of insulin: Secondary | ICD-10-CM | POA: Diagnosis not present

## 2024-06-28 DIAGNOSIS — S3992XA Unspecified injury of lower back, initial encounter: Secondary | ICD-10-CM | POA: Diagnosis present

## 2024-06-28 DIAGNOSIS — Z7984 Long term (current) use of oral hypoglycemic drugs: Secondary | ICD-10-CM | POA: Diagnosis not present

## 2024-06-28 LAB — URINALYSIS, ROUTINE W REFLEX MICROSCOPIC
Bacteria, UA: NONE SEEN
Bilirubin Urine: NEGATIVE
Glucose, UA: >1000 mg/dL — AB
Ketones, ur: NEGATIVE mg/dL
Leukocytes,Ua: NEGATIVE
Nitrite: NEGATIVE
Protein, ur: NEGATIVE mg/dL
RBC / HPF: >50 RBC/hpf (ref 0–5)
Specific Gravity, Urine: 1.021 (ref 1.005–1.030)
pH: 6 (ref 5.0–8.0)

## 2024-06-28 LAB — CBC WITH DIFFERENTIAL/PLATELET
Abs Immature Granulocytes: 0.03 K/uL (ref 0.00–0.07)
Basophils Absolute: 0.1 K/uL (ref 0.0–0.1)
Basophils Relative: 1 %
Eosinophils Absolute: 0.3 K/uL (ref 0.0–0.5)
Eosinophils Relative: 4 %
HCT: 34.8 % — ABNORMAL LOW (ref 36.0–46.0)
Hemoglobin: 10.9 g/dL — ABNORMAL LOW (ref 12.0–15.0)
Immature Granulocytes: 0 %
Lymphocytes Relative: 18 %
Lymphs Abs: 1.2 K/uL (ref 0.7–4.0)
MCH: 26.7 pg (ref 26.0–34.0)
MCHC: 31.3 g/dL (ref 30.0–36.0)
MCV: 85.1 fL (ref 80.0–100.0)
Monocytes Absolute: 0.5 K/uL (ref 0.1–1.0)
Monocytes Relative: 7 %
Neutro Abs: 4.9 K/uL (ref 1.7–7.7)
Neutrophils Relative %: 70 %
Platelets: 234 K/uL (ref 150–400)
RBC: 4.09 MIL/uL (ref 3.87–5.11)
RDW: 15.9 % — ABNORMAL HIGH (ref 11.5–15.5)
WBC: 7 K/uL (ref 4.0–10.5)
nRBC: 0 % (ref 0.0–0.2)

## 2024-06-28 LAB — COMPREHENSIVE METABOLIC PANEL WITH GFR
ALT: 16 U/L (ref 0–44)
AST: 26 U/L (ref 15–41)
Albumin: 3.8 g/dL (ref 3.5–5.0)
Alkaline Phosphatase: 112 U/L (ref 38–126)
Anion gap: 12 (ref 5–15)
BUN: 25 mg/dL — ABNORMAL HIGH (ref 8–23)
CO2: 22 mmol/L (ref 22–32)
Calcium: 9.7 mg/dL (ref 8.9–10.3)
Chloride: 106 mmol/L (ref 98–111)
Creatinine, Ser: 1.34 mg/dL — ABNORMAL HIGH (ref 0.44–1.00)
GFR, Estimated: 40 mL/min — ABNORMAL LOW (ref >60)
Glucose, Bld: 185 mg/dL — ABNORMAL HIGH (ref 70–99)
Potassium: 4.7 mmol/L (ref 3.5–5.1)
Sodium: 140 mmol/L (ref 135–145)
Total Bilirubin: 0.4 mg/dL (ref 0.0–1.2)
Total Protein: 7.2 g/dL (ref 6.5–8.1)

## 2024-06-28 LAB — LIPASE, BLOOD: Lipase: 17 U/L (ref 11–51)

## 2024-06-28 MED ORDER — LIDOCAINE 5 % EX PTCH: 1.0000 | MEDICATED_PATCH | CUTANEOUS | 0 refills | Status: AC

## 2024-06-28 MED ORDER — LIDOCAINE 5 % EX PTCH
1.0000 | MEDICATED_PATCH | CUTANEOUS | Status: DC
  Administered 2024-06-28: 1 via TRANSDERMAL
  Filled 2024-06-28: qty 1

## 2024-06-28 MED ORDER — ACETAMINOPHEN 500 MG PO TABS
1000.0000 mg | ORAL_TABLET | Freq: Once | ORAL | Status: AC
  Administered 2024-06-28: 1000 mg via ORAL
  Filled 2024-06-28: qty 2

## 2024-06-28 MED ORDER — CYCLOBENZAPRINE HCL 10 MG PO TABS: 5.0000 mg | ORAL_TABLET | Freq: Every evening | ORAL | 0 refills | Status: DC | PRN

## 2024-06-28 NOTE — Discharge Instructions (Addendum)
 It appears that you have strained the muscles in your left lower back which is causing your pain.  Your workup is reassuring today.  Your liver and pancreas labs are normal today.  Your kidney lab is abnormal but at your baseline.  This means that your kidneys are not filtering as well as they should be.  Please continue to have your PCP monitor your kidney function.  Your urine did not show any signs of infection  The CT scan of your abdomen did not show any abnormalities to explain your pain like a kidney stone.  We also were able to see the lumbar spine on your CT scan which showed some degenerative changes.  Please engage in light physical activity (like walking) to prevent your back pain from worsening and to prevent stiffness. Refrain from bedrest which can make your pain worse.   You may take up to 1000mg  of tylenol  every 6 hours as needed for pain.  Do not take more then 4g per day.  You were given your first dose here today, your next dose can be no sooner than 7:15pm  You may use a heating pack on your back to help with the pain.  You have been prescribed a muscle relaxer called Flexeril (cyclobenzaprine). You may take 0.5 - 1 tablet (5-10mg ) before bed as needed for muscle pain. This medication can be sedating. Do not drive or operate heavy machinery after taking this medicine. Do not drink alcohol or take other sedating medications when taking this medicine for safety reasons.  Keep this out of reach of small children.    You have been prescribed lidocaine  patched to help with pain. You may apply one patch to your back for up to 12 hours at a time. Then, you must remove the patch for a full 12 hours before re-applying a new patch.    Please contact your PCP for a repeat evaluation if your back pain does not start to improve over the next 2-3 weeks as you may benefit from a PT referral from your PCP.   Return to the ER if you have loss of bowel or bladder control, you develop fever,  you have numbness in your groin, or if you have any other new or concerning symptoms.

## 2024-06-28 NOTE — ED Notes (Signed)
 Pt d/c instructions, medications, and follow-up care reviewed with pt and husband. Pt and husband verbalized understanding and had no further questions at time of d/c. Pt CA&Ox4, ambulatory, and in NAD at time of d/c

## 2024-06-28 NOTE — ED Triage Notes (Signed)
 Reports lower left side back pain since Monday. Unsure of injury. Denies urinary symptoms.

## 2024-06-28 NOTE — ED Provider Notes (Signed)
 [place] EMERGENCY DEPARTMENT AT [facility] Provider Note   CSN: [id number] Arrival date & time: 06/28/24  9046     Patient presents with: Back Pain   [name] is a 78 y.o. female with history of chronic back pain, CHF, type 2 diabetes, presents with concern for left lower back pain that started 3 days ago.  She reports that the pain spreads from her lower back and wraps around to the left side of her abdomen. Pain is constant in nature. She denies any known injury, but reports that she was cleaning more than normal recently.  She does report some nausea from the pain but no vomiting.  No fever or chills.  Denies any dysuria, hematuria, increased frequency.  Denies any chest pain or shortness of breath.     Back Pain      Prior to Admission medications   Medication Sig Start Date End Date Taking? Authorizing Provider  cyclobenzaprine (FLEXERIL) 10 MG tablet Take 0.5-1 tablets (5-10 mg total) by mouth at bedtime as needed for muscle spasms (back pain). 06/28/24  Yes Veta Palma, PA-C  lidocaine  (LIDODERM ) 5 % Place 1 patch onto the skin daily. Remove & Discard patch within 12 hours or as directed by MD 06/28/24  Yes Veta Palma, PA-C  albuterol  (VENTOLIN  HFA) 108 (90 Base) MCG/ACT inhaler Inhale 2 puffs into the lungs every 4 (four) hours as needed for wheezing or shortness of breath. 04/29/24   Jodie Lavern CROME, MD  allopurinol  (ZYLOPRIM ) 100 MG tablet TAKE 1 TABLET BY MOUTH EVERY DAY 06/24/24   Jodie Lavern CROME, MD  BD PEN NEEDLE MINI ULTRAFINE 31G X 5 MM MISC USE AS DIRECTED 04/14/24   Webb, Padonda B, FNP  Calcium -Magnesium -Zinc  (CAL-MAG-ZINC  PO) Take 1 tablet by mouth in the morning.    [provider]  Carboxymeth-Glyc-Polysorb PF (REFRESH OPTIVE MEGA-3) 0.5-1-0.5 % SOLN Place 1-2 drops into both eyes 3 (three) times daily as needed (dry/irritated eyes.).    [provider]  carvedilol  (COREG ) 3.125 MG tablet TAKE 1 TABLET BY MOUTH 2 TIMES  DAILY. 04/24/24   Okey Vina GAILS, MD  cholecalciferol  (VITAMIN D ) 1000 UNITS tablet Take 1,000 Units by mouth in the morning.    [provider]  cyanocobalamin (VITAMIN B12) 1000 MCG tablet Take 1,000 mcg by mouth in the morning.    [provider]  dapagliflozin  propanediol (FARXIGA ) 10 MG TABS tablet Take 1 tablet (10 mg total) by mouth daily before breakfast. 03/07/24   Okey Vina GAILS, MD  famotidine  (PEPCID ) 20 MG tablet Take 20 mg by mouth at bedtime. 05/18/22   [provider]  fluticasone  (FLONASE ) 50 MCG/ACT nasal spray INSTILL 1 SPRAY INTO BOTH NOSTRILS DAILY 01/30/24   Jodie Lavern CROME, MD  furosemide  (LASIX ) 20 MG tablet Take 1 tablet (20 mg total) by mouth as directed. Monday,wed, Friday and sat only. 05/18/23   Okey Vina GAILS, MD  hydrALAZINE  (APRESOLINE ) 10 MG tablet Take 2 tablets (20 mg total) by mouth 2 (two) times daily. 03/07/24   Okey Vina GAILS, MD  ibuprofen (ADVIL) 200 MG tablet Take 600 mg by mouth daily as needed (pain.).    [provider]  insulin  glargine (LANTUS  SOLOSTAR) 100 UNIT/ML Solostar Pen Inject 25 Units into the skin daily. 01/22/24   Jodie Lavern CROME, MD  metFORMIN  (GLUCOPHAGE ) 500 MG tablet TAKE 1 TABLET BY MOUTH TWICE A DAY WITH FOOD 04/12/24   Jodie Lavern CROME, MD  montelukast  (SINGULAIR ) 10 MG tablet Take  1 tablet (10 mg total) by mouth daily. 05/20/24   Jodie Lavern CROME, MD  Multiple Vitamin (MULTIVITAMIN WITH MINERALS) TABS tablet Take 1 tablet by mouth in the morning.    [provider]  omeprazole  (PRILOSEC ) 40 MG capsule TAKE 1 CAPSULE (40 MG TOTAL) BY MOUTH DAILY. 02/12/24   Jodie Lavern CROME, MD  pregabalin  (LYRICA ) 100 MG capsule Take 1 capsule by mouth twice daily 10/18/23   Jodie Lavern CROME, MD  rOPINIRole  (REQUIP ) 3 MG tablet TAKE 1 TABLET BY MOUTH EVERYDAY AT BEDTIME 07/24/23   Jodie Lavern CROME, MD  rosuvastatin  (CRESTOR ) 20 MG tablet TAKE 1 TABLET BY MOUTH EVERY DAY 12/01/23   Jodie Lavern CROME, MD  sacubitril -valsartan   (ENTRESTO ) 49-51 MG Take 1 tablet by mouth 2 (two) times daily. 05/16/24   Okey Vina GAILS, MD  spironolactone  (ALDACTONE ) 25 MG tablet TAKE 1/2 TABLET BY MOUTH EVERY DAY 03/26/24   Okey Vina GAILS, MD    Allergies: Amlodipine , Amoxicillin -pot clavulanate, Depo-medrol  [methylprednisolone ], Gabapentin, Levothyroxine, Sitagliptin, Prednisone , Ace inhibitors, Amoxicillin , Codeine, Corticosteroids, Dexamethasone , and Tetracyclines & related    Review of Systems  Musculoskeletal:  Positive for back pain.    Updated Vital Signs BP (!) 163/79   Pulse 69   Temp 97.8 F (36.6 C) (Oral)   Resp 18   SpO2 96%   Physical Exam Vitals and nursing note reviewed.  Constitutional:      General: She is not in acute distress.    Appearance: She is well-developed.  HENT:     Head: Normocephalic and atraumatic.  Eyes:     Conjunctiva/sclera: Conjunctivae normal.  Cardiovascular:     Rate and Rhythm: Normal rate and regular rhythm.     Heart sounds: No murmur heard.    Comments: 1+ pedal pulses bilaterally Pulmonary:     Effort: Pulmonary effort is normal. No respiratory distress.     Breath sounds: Normal breath sounds.  Abdominal:     Palpations: Abdomen is soft.     Tenderness: There is no abdominal tenderness.     Comments: Mild tenderness in the left upper quadrant without rebound or guarding  Musculoskeletal:        General: No swelling.       Arms:     Cervical back: Neck supple.     Comments: Nontender to the cervical, thoracic, or lumbar spine.  Tender to the left lumbar musculature and left gluteal musculature  Skin:    General: Skin is warm and dry.     Capillary Refill: Capillary refill takes less than 2 seconds.  Neurological:     Mental Status: She is alert.     Comments: 5/5 strength with resisted hip flexion and extension, knee flexion extension, ankle plantarflexion and dorsiflexion bilaterally  Intact sensation to the bilateral lower extremities  Psychiatric:        Mood  and Affect: Mood normal.     (all labs ordered are listed, but only abnormal results are displayed) Labs Reviewed  CBC WITH DIFFERENTIAL/PLATELET - Abnormal; Notable for the following components:      Result Value   Hemoglobin 10.9 (*)    HCT 34.8 (*)    RDW 15.9 (*)    All other components within normal limits  COMPREHENSIVE METABOLIC PANEL WITH GFR - Abnormal; Notable for the following components:   Glucose, Bld 185 (*)    BUN 25 (*)    Creatinine, Ser 1.34 (*)    GFR, Estimated 40 (*)    All other  components within normal limits  URINALYSIS, ROUTINE W REFLEX MICROSCOPIC - Abnormal; Notable for the following components:   Glucose, UA >1,000 (*)    Hgb urine dipstick MODERATE (*)    All other components within normal limits  LIPASE, BLOOD    EKG: None  Radiology: CT L-SPINE NO CHARGE Result Date: 06/28/2024 CLINICAL DATA:  Left-sided back pain for several days. EXAM: CT LUMBAR SPINE WITHOUT CONTRAST TECHNIQUE: Multidetector CT imaging of the lumbar spine was performed without intravenous contrast administration. Multiplanar CT image reconstructions were also generated. RADIATION DOSE REDUCTION: This exam was performed according to the departmental dose-optimization program which includes automated exposure control, adjustment of the mA and/or kV according to patient size and/or use of iterative reconstruction technique. COMPARISON:  Lumbar MRI, 04/27/2012. 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 Alignment: Scoliosis. Moderate severity, convex the left at the thoracolumbar junction and to the right, apex at L4. Vertebrae: No acute fracture or focal pathologic process. Paraspinal and other soft tissues: No soft tissue mass, inflammation or hemorrhage. Aortic atherosclerosis. Disc levels: Moderate to marked loss of disc height with mild disc bulging at T11-T12 and T12-L1. L1-L2: Moderate to marked loss of disc height. Mild disc bulging. Mild to moderate right  neural foraminal narrowing. L2-L3: Moderate loss of disc height. Mild disc bulging. Mild to moderate left and mild right facet degenerative change. No significant stenosis. L3-L4: Marked loss of disc height. Mild disc bulging. Mild bilateral facet degenerative change. Mild narrowing of the central spinal canal and superolateral recesses and left neural foramen. L4-L5: Marked loss of disc height. Moderate, left greater than right, facet degenerative change. Moderate right and mild left neural foraminal narrowing. L5-S1: Unremarkable. IMPRESSION: 1. No fracture or acute finding. 2. Scoliosis and significant degenerative changes throughout the lumbar spine as detailed. Findings have progressed when compared to the MRI from 04/27/2012. No visualized disc herniation. Electronically Signed   By: Alm Parkins M.D.   On: 06/28/2024 12:01   CT Renal Stone Study Result Date: 06/28/2024 CLINICAL DATA:  Left flank pain. EXAM: CT ABDOMEN AND PELVIS WITHOUT CONTRAST TECHNIQUE: Multidetector CT imaging of the abdomen and pelvis was performed following the standard protocol without IV contrast. RADIATION DOSE REDUCTION: This exam was performed according to the departmental dose-optimization program which includes automated exposure control, adjustment of the mA and/or kV according to patient size and/or use of iterative reconstruction technique. COMPARISON:  06/22/2009. FINDINGS: Lower chest: Clear lung bases. Hepatobiliary: No focal liver abnormality is seen. No gallstones, gallbladder wall thickening, or biliary dilatation. Pancreas: Pancreatic atrophy.  No mass or inflammation. Spleen: Normal in size without focal abnormality. Adrenals/Urinary Tract: Adrenal glands are unremarkable. Kidneys are normal, without renal calculi, focal lesion, or hydronephrosis. Bladder is unremarkable. Stomach/Bowel: Stomach is within normal limits. Appendix appears normal. No evidence of bowel wall thickening, distention, or inflammatory  changes. Vascular/Lymphatic: Moderate aortic atherosclerotic calcifications. No aneurysm. No enlarged lymph nodes. Reproductive: Uterus and bilateral adnexa are unremarkable. Other: No abdominal wall hernia or abnormality. No abdominopelvic ascites. Musculoskeletal: No fracture or acute finding. Lumbar scoliosis. Marked disc and facet degenerative changes of the lumbar spine. IMPRESSION: 1. No acute findings. Specifically, no renal or ureteral stones or obstructive uropathy. No findings to account for the patient's symptoms. 2. Aortic atherosclerosis. Electronically Signed   By: Alm Parkins M.D.   On: 06/28/2024 11:54     Procedures   Medications Ordered in the ED  lidocaine  (LIDODERM ) 5 % 1-2 patch (1 patch Transdermal Patch Applied 06/28/24 1024)  acetaminophen  (TYLENOL ) tablet 1,000 mg (1,000 mg Oral Given 06/28/24 1319)                                    Medical Decision Making Amount and/or Complexity of Data Reviewed Labs: ordered. Radiology: ordered.  Risk OTC drugs. Prescription drug management.     Differential diagnosis includes but is not limited to  ACS, lumbar strain, acute cholecystitis, cholelithiasis, cholangitis, choledocholithiasis, peptic ulcer, gastritis, gastroenteritis, appendicitis, IBS, IBD, DKA, nephrolithiasis, UTI, pyelonephritis, pancreatitis, diverticulitis, mesenteric ischemia, abdominal aortic aneurysm, small bowel obstruction, volvulus   ED Course:  Upon initial evaluation, patient is uncomfortable appearing, trying to find a comfortable position.  She has reproducible tenderness to palpation of the left lumbar musculature.  She also has mild tenderness to the left upper quadrant, but no rebound or guarding.  Reporting nausea but no vomiting.  Labs Ordered: I Ordered, and personally interpreted labs.  The pertinent results include:   CBC without leukocytosis.  Hemoglobin 10.9, at baseline CMP with elevated glucose at 185.  Elevated creatinine at  1.34, at baseline.  No other electrolyte abnormalities.  No elevation in LFTs Lipase within normal limits Urinalysis with high amount of glucose and red blood cells, no signs of infection  Imaging Studies ordered: I ordered imaging studies including CT renal, CT lumbar spine I independently visualized the imaging with scope of interpretation limited to determining acute life threatening conditions related to emergency care. Imaging showed  CT renal: IMPRESSION:  1. No acute findings. Specifically, no renal or ureteral stones or  obstructive uropathy. No findings to account for the patient's  symptoms.  2. Aortic atherosclerosis.   CT lumbar spine: IMPRESSION:  1. No fracture or acute finding.  2. Scoliosis and significant degenerative changes throughout the  lumbar spine as detailed. Findings have progressed when compared to  the MRI from 04/27/2012. No visualized disc herniation.   I agree with the radiologist interpretation   Medications Given: Lidocaine  patch Tylenol   Upon re-evaluation, patient patient still reports some pain in her left lower back.  Her workup today is reassuring.  Her labs are at baseline.  She does not have any abnormalities in LFTs or lipase.  Her creatinine is elevated, but at baseline.  Her urinalysis does not show any signs of infection.  CT scan of the abdomen and lumbar spine were obtained for further evaluation.  CT scan of the abdomen did not reveal any acute abnormality such as nephrolithiasis, pyelonephritis, or other abnormality to explain patient's pain.  CT scan of the lumbar spine showed some degenerative changes.  However, patient's pain is more so along the lumbar musculature, not along the spine itself.  Given reassuring labs and imaging, suspect that patient's pain is due to a lumbar strain.  She does not have any numbness or weakness in the lower extremities to suggest a spinal cord injury or impingement.  She is stable and appropriate for  discharge home at this time    Impression: Left lumbar strain  Disposition:  The patient was discharged home with instructions to take Tylenol  as needed for pain.  Avoid NSAIDs due to her decreased kidney function.  May use heating packs on the back to help with pain.  Lidocaine  patches as needed for pain.  Flexeril before bed sparingly as needed for pain.  She understands that Flexeril may make her drowsy and she should not drink alcohol or drive her  to taking this medication.  She understands that this could increase her fall risk and to not take this during the day. Return precautions given and patient verbalized understanding.     This chart was dictated using voice recognition software, Dragon. Despite the best efforts of this provider to proofread and correct errors, errors may still occur which can change documentation meaning.       Final diagnoses:  Lumbar strain, initial encounter    ED Discharge Orders          Ordered    lidocaine  (LIDODERM ) 5 %  Every 24 hours        06/28/24 1347    cyclobenzaprine (FLEXERIL) 10 MG tablet  At bedtime PRN        06/28/24 1347               Veta Palma, PA-C 06/28/24 1352    Charlyn Sora, MD 06/28/24 1519

## 2024-07-01 ENCOUNTER — Encounter: Payer: Self-pay | Admitting: Internal Medicine

## 2024-07-01 MED ORDER — FUROSEMIDE 20 MG PO TABS: 20.0000 mg | ORAL_TABLET | ORAL | 3 refills | Status: AC

## 2024-07-03 ENCOUNTER — Telehealth: Payer: Self-pay

## 2024-07-03 NOTE — Telephone Encounter (Signed)
 Transition Care Management Unsuccessful Follow-up Telephone Call  Date of discharge and from where:  06/28/24 [facility]  Attempts:  1st Attempt  Reason for unsuccessful TCM follow-up call:  Left voice message; LVM for patient to complete TOC call and schedule for ED follow up with PCP. Advised to call our office to schedule this appointment. If pt returns call please schedule pt accordingly.

## 2024-07-08 ENCOUNTER — Other Ambulatory Visit: Payer: Self-pay

## 2024-07-08 MED ORDER — EMBECTA PEN NEEDLE ULTRAFINE 31G X 5 MM MISC: 5 refills | Status: AC

## 2024-07-09 ENCOUNTER — Ambulatory Visit: Admitting: Family Medicine

## 2024-07-11 ENCOUNTER — Other Ambulatory Visit: Payer: Self-pay | Admitting: Family Medicine

## 2024-07-14 ENCOUNTER — Other Ambulatory Visit: Payer: Self-pay | Admitting: Family Medicine

## 2024-07-15 NOTE — Telephone Encounter (Signed)
 04/29/2024 LOV  10/18/2023 fill date   180/3 refills

## 2024-07-15 NOTE — Telephone Encounter (Signed)
 Please Advise

## 2024-07-19 ENCOUNTER — Encounter: Payer: Self-pay | Admitting: Family Medicine

## 2024-07-19 ENCOUNTER — Ambulatory Visit: Admitting: Family Medicine

## 2024-07-19 VITALS — BP 136/78 | HR 78 | Temp 97.3°F | Ht 64.0 in | Wt 199.6 lb

## 2024-07-19 DIAGNOSIS — E1159 Type 2 diabetes mellitus with other circulatory complications: Secondary | ICD-10-CM

## 2024-07-19 DIAGNOSIS — M5136 Other intervertebral disc degeneration, lumbar region with discogenic back pain only: Secondary | ICD-10-CM | POA: Diagnosis not present

## 2024-07-19 DIAGNOSIS — M5030 Other cervical disc degeneration, unspecified cervical region: Secondary | ICD-10-CM | POA: Diagnosis not present

## 2024-07-19 DIAGNOSIS — E1142 Type 2 diabetes mellitus with diabetic polyneuropathy: Secondary | ICD-10-CM

## 2024-07-19 DIAGNOSIS — E1122 Type 2 diabetes mellitus with diabetic chronic kidney disease: Secondary | ICD-10-CM | POA: Diagnosis not present

## 2024-07-19 DIAGNOSIS — E119 Type 2 diabetes mellitus without complications: Secondary | ICD-10-CM

## 2024-07-19 DIAGNOSIS — S39012D Strain of muscle, fascia and tendon of lower back, subsequent encounter: Secondary | ICD-10-CM

## 2024-07-19 DIAGNOSIS — Z794 Long term (current) use of insulin: Secondary | ICD-10-CM

## 2024-07-19 DIAGNOSIS — I152 Hypertension secondary to endocrine disorders: Secondary | ICD-10-CM

## 2024-07-19 LAB — HEMOGLOBIN A1C: Hgb A1c MFr Bld: 7.9 % — ABNORMAL HIGH (ref 4.6–6.5)

## 2024-07-19 NOTE — Progress Notes (Signed)
 "  Subjective  CC:  Chief Complaint  Patient presents with   Follow-up   Medical Management of Chronic Issues    [name] for a 6 month follow-up to manage chronic conditions. Pulled a muscle of something. Feels a knot on the left side of back. Has fasted if labs are needed.    Muscle Pain    HPI: [name] is a 78 y.o. female who presents to the office today for follow up of diabetes and problems listed above in the chief complaint.  Discussed the use of AI scribe software for clinical note transcription with the patient, who gave verbal consent to proceed.  History of Present Illness [name] is a 78 year old female who presents for a follow-up visit for back pain and diabetes management.  Back pain- chronic back pain with acute flare - Severe back pain necessitated an emergency room visit. I reviewed the notes and treatment plan - Pain intensity causes nausea and is relieved only by lying on [name] side. - Flexeril  and Tylenol  were ineffective; ibuprofen provided better relief. - Presence of a knot in [name] back, especially when sitting on hard surfaces. - Pain worsened during granddaughter's graduation ceremony due to hard pews. - now managing better. Much improved. Rare ibuprofen now (CKD). Sxs are back to baseline.   Diabetes f/u:  - fasting Blood glucose levels range from 66 to 125. - Currently takes 24 units of Lantus  in the morning; previously instructed to take 25 units. On farxiga  and met - Diet less controlled during holidays, but often discards sweets to avoid overconsumption. - no sxs of hyperglycemia.  - no foot concerns  Bp is controlled.  Ros: no cp or sob    Wt Readings from Last 3 Encounters:  07/19/24 199 lb 9.6 oz (90.5 kg)  05/14/24 200 lb 12.8 oz (91.1 kg)  04/29/24 200 lb 9.6 oz (91 kg)    BP Readings from Last 3 Encounters:  07/19/24 136/78  06/28/24 (!) 163/79  05/14/24 (!) 140/66    Assessment  1. Type 2 diabetes mellitus with  peripheral neuropathy (HCC)   2. Hypertension associated with diabetes (HCC)   3. Lumbar strain, subsequent encounter   4. DDD (degenerative disc disease), cervical   5. Degeneration of intervertebral disc of lumbar region with discogenic back pain   6. Insulin -requiring or dependent type II diabetes mellitus (HCC)      Plan  Assessment and Plan Assessment & Plan Lumbar disc degeneration with discogenic back pain Chronic lumbar disc degeneration with discogenic back pain. Recent exacerbation required emergency room visit due to severe pain while walking, causing nausea. Flexeril  and Tylenol  were ineffective; ibuprofen provided relief. Reports a knot in the back, which is not currently present. Pain exacerbated by sitting on hard surfaces and physical activity. - Will consider dry needling for muscle knots if she recurs. - Advised to avoid overexertion to prevent exacerbation of symptoms.  Type 2 diabetes mellitus Recent blood glucose readings ranging from 66 to 125 mg/dL. Last HbA1c was 7.2%. Currently on 24 units of Lantus  in the morning. No significant dietary indiscretions reported, though acknowledges occasional high-sugar food preparation. Finger stick machine is broken, requiring blood draw from the arm for testing. - Ordered blood draw for HbA1c testing. - Continue current Lantus  dosage of 24 units in the morning. Cont met and farxiga  - Advised to maintain dietary vigilance, especially during holidays. - goal A1c < 8 given age and comorbidities  HTN is controlled  CKD: renal function is stable by recent labs    Follow up: 6 mo for cpe Orders Placed This Encounter  Procedures   Hemoglobin A1c   No orders of the defined types were placed in this encounter.     Immunization History  Administered Date(s) Administered   Fluad Quad(high Dose 65+) 04/19/2018, 06/16/2020, 07/01/2020, 04/16/2021, 05/10/2022   Fluad Trivalent(High Dose 65+) 05/24/2023   INFLUENZA, HIGH DOSE  SEASONAL PF 05/13/2014, 04/24/2015, 04/12/2017, 04/19/2018, 04/24/2019, 06/16/2020   Influenza Split 06/01/2010   Influenza, Seasonal, Injecte, Preservative Fre 04/09/2013, 05/03/2016   Influenza,inj,Quad PF,6+ Mos 05/01/2017   Influenza-Unspecified 04/03/2012, 04/24/2024   Moderna Sars-Covid-2 Vaccination 01/15/2021, 02/12/2021   Pneumococcal Conjugate-13 05/13/2014   Pneumococcal Polysaccharide-23 02/15/2011   Td 08/01/1998   Td (Adult), 2 Lf Tetanus Toxid, Preservative Free 08/01/1998   Tdap 05/01/2004   Unspecified SARS-COV-2 Vaccination 05/11/2021   Zoster Recombinant(Shingrix) 04/24/2019, 08/13/2019   Zoster, Live 02/15/2011    Diabetes Related Lab Review: Lab Results  Component Value Date   HGBA1C 7.3 (A) 01/05/2024   HGBA1C 7.9 (A) 10/06/2023   HGBA1C 7.7 (A) 07/07/2023    Lab Results  Component Value Date   MICROALBUR <0.7 10/06/2023   Lab Results  Component Value Date   CREATININE 1.34 (H) 06/28/2024   BUN 25 (H) 06/28/2024   NA 140 06/28/2024   K 4.7 06/28/2024   CL 106 06/28/2024   CO2 22 06/28/2024   Lab Results  Component Value Date   CHOL 119 10/06/2023   CHOL 109 10/04/2022   CHOL 111 09/28/2021   Lab Results  Component Value Date   HDL 58.50 10/06/2023   HDL 57.90 10/04/2022   HDL 55.70 09/28/2021   Lab Results  Component Value Date   LDLCALC 38 10/06/2023   LDLCALC 31 10/04/2022   LDLCALC 37 09/28/2021   Lab Results  Component Value Date   TRIG 111.0 10/06/2023   TRIG 102.0 10/04/2022   TRIG 91.0 09/28/2021   Lab Results  Component Value Date   CHOLHDL 2 10/06/2023   CHOLHDL 2 10/04/2022   CHOLHDL 2 09/28/2021   No results found for: LDLDIRECT The ASCVD Risk score (Arnett DK, et al., 2019) failed to calculate for the following reasons:   The valid total cholesterol range is 130 to 320 mg/dL I have reviewed the PMH, Fam and Soc history. Patient Active Problem List   Diagnosis Date Noted Date Diagnosed   Stage 3a chronic kidney  disease (HCC) 10/04/2022     Priority: High   Combined hyperlipidemia associated with type 2 diabetes mellitus (HCC) 09/28/2021     Priority: High   Nonischemic cardiomyopathy (HCC) 10/06/2020     Priority: High   Heart failure with mildly reduced ejection fraction (HFmrEF, 41-49%) (HCC) 08/24/2020     Priority: High    Non-ischemic cardiomyopathy (dx during admission for COVID in 2022) TTE (08/14/2020): EF 20-25% TTE (10/15/2020): EF 30-35% TTE (01/26/2021): EF 45% TTE (01/10/2022): EF 45-50%, Grade 1 diastolic dysfunction, Normal RVSF, Trivial MR, Mitral valve prolapse    Type 2 diabetes mellitus with peripheral neuropathy (HCC) 04/09/2013     Priority: High    New onset 04/2013, doesn't tolerate 1000mg  metformin  due to GI upset. Farxiga , lantus , met 500 bid lyrica     Hypertension associated with diabetes (HCC) 11/08/2010     Priority: High   History of total knee arthroplasty 2024, Dr. 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     Priority: Medium    Gout 07/19/2021     Priority: Medium     On allopurinol     Rotator cuff tear arthropathy 10/22/2020     Priority: Medium    Primary osteoarthritis of left shoulder 07/29/2020     Priority: Medium    DDD (degenerative disc disease), cervical 04/30/2020     Priority: Medium    Osteoarthritis of left knee 11/05/2018     Priority: Medium    Chronic low back pain 09/05/2017     Priority: Medium    Degenerative lumbar disc 06/04/2015     Priority: Medium    S/P lumbar laminectomy 06/04/2015     Priority: Medium    Restless leg syndrome 05/13/2014     Priority: Medium    Spinal stenosis of lumbar region without neurogenic claudication 09/12/2012     Priority: Medium     Formatting of this note might be different from the original. Right L4/5     GERD (gastroesophageal reflux disease) 05/25/2011     Priority: Medium     Chronic recurrent heartburn symptoms for 3 or 4 years without weight loss, bleeding or dysphagia. Bonds well to daily PPI.Lupita CHARLENA Commander, MD, [facility]     Mitral valve prolapse 10/10/2008     Priority: Medium     Formatting of this note might be different from the original. 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 Karis, CT scan, sinus surgery 2023    Hemorrhoids, internal, with bleeding 05/25/2011     Priority: Low    Visible on 2005 colonoscopy and endoscopy 05/25/2011.    Chronic allergic rhinitis 08/19/2010     Priority: Low   Acute recurrent maxillary sinusitis 04/21/2024    Other dysphagia 03/01/2024    Pure hypercholesterolemia 10/30/2023    PVC's (premature ventricular contractions) 10/30/2023    CAD (coronary artery disease) 10/30/2023     LHC 09/07/20: mid LAD 10-20%, otherwise no significant CAD Lexiscan  MPI 11/09/2023: No ischemia or infarction, no RWMA, EF 50, low risk    Mass of buttock 08/08/2023    Vertigo 06/20/2023    Arthrofibrosis of total knee replacement 06/05/2023     Social History: Patient  reports that she has never smoked. She has never used smokeless tobacco. She reports that she does not drink alcohol and does not use drugs.  Review of Systems: Ophthalmic: negative for eye pain, loss of vision or double vision Cardiovascular: negative for chest pain Respiratory: negative for SOB or persistent cough Gastrointestinal: negative for abdominal pain Genitourinary: negative for dysuria or gross hematuria MSK: negative for foot  lesions Neurologic: negative for weakness or gait disturbance  Objective  Vitals: BP 136/78 (BP Location: Left Arm, Patient Position: Sitting, Cuff Size: Normal)   Pulse 78   Temp (!) 97.3 F (36.3 C) (Temporal)   Ht 5' 4 (1.626 m)   Wt 199  lb 9.6 oz (90.5 kg)   SpO2 94%   BMI 34.26 kg/m  General: well appearing, no acute distress  Psych:  Alert and oriented, normal mood and affect HEENT:  Normocephalic, atraumatic, moist mucous membranes, supple neck  Cardiovascular:  Nl S1 and S2, RRR with 2/6 murmur, gallop or rub. no edema Respiratory:  Good breath sounds bilaterally, CTAB with normal effort, no rales  Diabetic education: ongoing education regarding chronic disease management for diabetes was given today. We continue to reinforce the ABC's of diabetic management: A1c (<7 or 8 dependent upon patient), tight blood pressure control, and cholesterol management with goal LDL < 100 minimally. We discuss diet strategies, exercise recommendations, medication options and possible side effects. At each visit, we review recommended immunizations and preventive care recommendations for diabetics and stress that good diabetic control can prevent other problems. See below for this patient's data. Commons side effects, risks, benefits, and alternatives for medications and treatment plan prescribed today were discussed, and the patient expressed understanding of the given instructions. Patient is instructed to call or message via MyChart if he/she has any questions or concerns regarding our treatment plan. No barriers to understanding were identified. We discussed Red Flag symptoms and signs in detail. Patient expressed understanding regarding what to do in case of urgent or emergency type symptoms.  Medication list was reconciled, printed and provided to the patient in AVS. Patient instructions and summary information was reviewed with the patient as documented in the AVS. This note was prepared with  assistance of Dragon voice recognition software. Occasional wrong-word or sound-a-like substitutions may have occurred due to the inherent limitations of voice recognition software   "

## 2024-07-19 NOTE — Patient Instructions (Signed)
 Please return in 6 months for your annual complete physical; please come fasting.   I will release your lab results to you on your MyChart account with further instructions. You may see the results before I do, but when I review them I will send you a message with my report or have my assistant call you if things need to be discussed. Please reply to my message with any questions. Thank you!   If you have any questions or concerns, please don't hesitate to send me a message via MyChart or call the office at [phone]. Thank you for visiting with us  today! It's our pleasure caring for you.

## 2024-07-26 ENCOUNTER — Ambulatory Visit: Payer: Self-pay | Admitting: Family Medicine

## 2024-07-26 NOTE — Progress Notes (Signed)
 See mychart note Dear Ms. Umble, Your A1c is 7.9. This is on the high side; continue to watch your diet. We will check again at your next visit. If levels go above 8 I will adjust your medication.  I'd like to keep it below 8.  Take care. Sincerely, Dr. Jodie

## 2024-08-14 ENCOUNTER — Other Ambulatory Visit: Payer: Self-pay | Admitting: Family Medicine

## 2024-09-11 ENCOUNTER — Ambulatory Visit: Admitting: Internal Medicine

## 2024-09-12 ENCOUNTER — Ambulatory Visit: Admitting: Internal Medicine

## 2025-01-17 ENCOUNTER — Ambulatory Visit: Admitting: Family Medicine

## 2025-05-20 ENCOUNTER — Ambulatory Visit
